# Patient Record
Sex: Male | Born: 1945 | Race: White | Hispanic: No | Marital: Married | Smoking: Never smoker
Health system: Southern US, Community
[De-identification: ages and names within clinical notes are randomized; demographics above are authoritative.]

## PROBLEM LIST (undated history)

## (undated) DIAGNOSIS — R32 Unspecified urinary incontinence: Secondary | ICD-10-CM

## (undated) DIAGNOSIS — I82409 Acute embolism and thrombosis of unspecified deep veins of unspecified lower extremity: Secondary | ICD-10-CM

## (undated) DIAGNOSIS — G20A1 Parkinson's disease without dyskinesia, without mention of fluctuations: Secondary | ICD-10-CM

## (undated) DIAGNOSIS — Z87442 Personal history of urinary calculi: Secondary | ICD-10-CM

## (undated) DIAGNOSIS — G20 Parkinson's disease: Secondary | ICD-10-CM

## (undated) DIAGNOSIS — E039 Hypothyroidism, unspecified: Secondary | ICD-10-CM

## (undated) DIAGNOSIS — F32A Depression, unspecified: Secondary | ICD-10-CM

## (undated) DIAGNOSIS — K219 Gastro-esophageal reflux disease without esophagitis: Secondary | ICD-10-CM

## (undated) DIAGNOSIS — I10 Essential (primary) hypertension: Secondary | ICD-10-CM

## (undated) DIAGNOSIS — R112 Nausea with vomiting, unspecified: Secondary | ICD-10-CM

## (undated) DIAGNOSIS — Z9889 Other specified postprocedural states: Secondary | ICD-10-CM

## (undated) DIAGNOSIS — G4733 Obstructive sleep apnea (adult) (pediatric): Secondary | ICD-10-CM

## (undated) DIAGNOSIS — M48061 Spinal stenosis, lumbar region without neurogenic claudication: Secondary | ICD-10-CM

## (undated) DIAGNOSIS — G4752 REM sleep behavior disorder: Secondary | ICD-10-CM

## (undated) HISTORY — DX: Acute embolism and thrombosis of unspecified deep veins of unspecified lower extremity: I82.409

## (undated) HISTORY — DX: Obstructive sleep apnea (adult) (pediatric): G47.33

## (undated) HISTORY — DX: Hypothyroidism, unspecified: E03.9

## (undated) HISTORY — PX: TONSILLECTOMY: SUR1361

## (undated) HISTORY — PX: COLONOSCOPY: SHX174

## (undated) HISTORY — DX: Spinal stenosis, lumbar region without neurogenic claudication: M48.061

## (undated) HISTORY — PX: NEPHRECTOMY: SHX65

## (undated) HISTORY — DX: REM sleep behavior disorder: G47.52

## (undated) HISTORY — PX: PARTIAL NEPHRECTOMY: SHX414

## (undated) HISTORY — PX: SPINAL FUSION: SHX223

## (undated) HISTORY — DX: Parkinson's disease without dyskinesia, without mention of fluctuations: G20.A1

## (undated) HISTORY — PX: BACK SURGERY: SHX140

## (undated) HISTORY — PX: DEEP BRAIN STIMULATOR PLACEMENT: SHX608

## (undated) HISTORY — DX: Parkinson's disease: G20

## (undated) HISTORY — PX: KNEE SURGERY: SHX244

## (undated) HISTORY — DX: Essential (primary) hypertension: I10

## (undated) HISTORY — DX: Unspecified urinary incontinence: R32

## (undated) HISTORY — DX: Gastro-esophageal reflux disease without esophagitis: K21.9

---

## 2008-04-12 ENCOUNTER — Emergency Department (HOSPITAL_BASED_OUTPATIENT_CLINIC_OR_DEPARTMENT_OTHER): Admission: EM | Admit: 2008-04-12 | Discharge: 2008-04-12 | Payer: Self-pay | Admitting: Emergency Medicine

## 2008-04-12 ENCOUNTER — Ambulatory Visit: Payer: Self-pay | Admitting: Diagnostic Radiology

## 2010-06-16 LAB — DIFFERENTIAL
Basophils Absolute: 0.3 10*3/uL — ABNORMAL HIGH (ref 0.0–0.1)
Basophils Relative: 3 % — ABNORMAL HIGH (ref 0–1)
Eosinophils Absolute: 0 10*3/uL (ref 0.0–0.7)
Eosinophils Relative: 0 % (ref 0–5)
Lymphocytes Relative: 7 % — ABNORMAL LOW (ref 12–46)
Lymphs Abs: 0.7 10*3/uL (ref 0.7–4.0)
Monocytes Absolute: 0.6 10*3/uL (ref 0.1–1.0)
Monocytes Relative: 5 % (ref 3–12)
Neutro Abs: 9 10*3/uL — ABNORMAL HIGH (ref 1.7–7.7)
Neutrophils Relative %: 86 % — ABNORMAL HIGH (ref 43–77)

## 2010-06-16 LAB — COMPREHENSIVE METABOLIC PANEL
ALT: 27 U/L (ref 0–53)
AST: 32 U/L (ref 0–37)
Albumin: 4.4 g/dL (ref 3.5–5.2)
Alkaline Phosphatase: 75 U/L (ref 39–117)
BUN: 24 mg/dL — ABNORMAL HIGH (ref 6–23)
CO2: 28 mEq/L (ref 19–32)
Calcium: 9.5 mg/dL (ref 8.4–10.5)
Chloride: 104 mEq/L (ref 96–112)
Creatinine, Ser: 1.3 mg/dL (ref 0.4–1.5)
GFR calc Af Amer: >60 mL/min (ref >60)
GFR calc non Af Amer: 56 mL/min — ABNORMAL LOW (ref >60)
Glucose, Bld: 115 mg/dL — ABNORMAL HIGH (ref 70–99)
Potassium: 4.5 mEq/L (ref 3.5–5.1)
Sodium: 140 mEq/L (ref 135–145)
Total Bilirubin: 1.2 mg/dL (ref 0.3–1.2)
Total Protein: 7.6 g/dL (ref 6.0–8.3)

## 2010-06-16 LAB — URINALYSIS, ROUTINE W REFLEX MICROSCOPIC
Bilirubin Urine: NEGATIVE
Glucose, UA: NEGATIVE mg/dL
Hgb urine dipstick: NEGATIVE
Ketones, ur: NEGATIVE mg/dL
Nitrite: NEGATIVE
Protein, ur: NEGATIVE mg/dL
Specific Gravity, Urine: 1.016 (ref 1.005–1.030)
Urobilinogen, UA: 0.2 mg/dL (ref 0.0–1.0)
pH: 8 (ref 5.0–8.0)

## 2010-06-16 LAB — CBC
HCT: 47 % (ref 39.0–52.0)
Hemoglobin: 15.1 g/dL (ref 13.0–17.0)
MCHC: 32 g/dL (ref 30.0–36.0)
MCV: 91.6 fL (ref 78.0–100.0)
Platelets: 183 10*3/uL (ref 150–400)
RBC: 5.14 MIL/uL (ref 4.22–5.81)
RDW: 11.6 % (ref 11.5–15.5)
WBC: 10.6 10*3/uL — ABNORMAL HIGH (ref 4.0–10.5)

## 2012-08-02 ENCOUNTER — Ambulatory Visit: Attending: Neurology | Admitting: Occupational Therapy

## 2012-08-02 DIAGNOSIS — G20A1 Parkinson's disease without dyskinesia, without mention of fluctuations: Secondary | ICD-10-CM | POA: Insufficient documentation

## 2012-08-02 DIAGNOSIS — R269 Unspecified abnormalities of gait and mobility: Secondary | ICD-10-CM | POA: Insufficient documentation

## 2012-08-02 DIAGNOSIS — G20 Parkinson's disease: Secondary | ICD-10-CM | POA: Insufficient documentation

## 2012-08-02 DIAGNOSIS — R279 Unspecified lack of coordination: Secondary | ICD-10-CM | POA: Insufficient documentation

## 2012-08-02 DIAGNOSIS — Z5189 Encounter for other specified aftercare: Secondary | ICD-10-CM | POA: Insufficient documentation

## 2012-08-03 ENCOUNTER — Ambulatory Visit: Admitting: Occupational Therapy

## 2012-08-08 ENCOUNTER — Ambulatory Visit: Admitting: Physical Therapy

## 2012-08-08 ENCOUNTER — Ambulatory Visit: Admitting: Occupational Therapy

## 2012-08-09 ENCOUNTER — Ambulatory Visit: Admitting: Physical Therapy

## 2012-08-10 ENCOUNTER — Ambulatory Visit: Admitting: Occupational Therapy

## 2012-08-13 ENCOUNTER — Other Ambulatory Visit: Payer: Self-pay | Admitting: Neurology

## 2012-08-15 ENCOUNTER — Ambulatory Visit: Admitting: Physical Therapy

## 2012-08-17 ENCOUNTER — Ambulatory Visit: Admitting: Occupational Therapy

## 2012-08-17 ENCOUNTER — Ambulatory Visit: Admitting: Physical Therapy

## 2012-08-21 ENCOUNTER — Ambulatory Visit: Admitting: Physical Therapy

## 2012-08-29 ENCOUNTER — Ambulatory Visit: Admitting: Physical Therapy

## 2012-08-29 ENCOUNTER — Ambulatory Visit: Attending: Neurology | Admitting: Occupational Therapy

## 2012-08-29 DIAGNOSIS — R269 Unspecified abnormalities of gait and mobility: Secondary | ICD-10-CM | POA: Insufficient documentation

## 2012-08-29 DIAGNOSIS — G20 Parkinson's disease: Secondary | ICD-10-CM | POA: Insufficient documentation

## 2012-08-29 DIAGNOSIS — Z5189 Encounter for other specified aftercare: Secondary | ICD-10-CM | POA: Insufficient documentation

## 2012-08-29 DIAGNOSIS — G20A1 Parkinson's disease without dyskinesia, without mention of fluctuations: Secondary | ICD-10-CM | POA: Insufficient documentation

## 2012-08-29 DIAGNOSIS — R279 Unspecified lack of coordination: Secondary | ICD-10-CM | POA: Insufficient documentation

## 2012-08-30 ENCOUNTER — Ambulatory Visit: Admitting: Occupational Therapy

## 2012-08-31 ENCOUNTER — Ambulatory Visit: Admitting: Physical Therapy

## 2012-09-05 ENCOUNTER — Ambulatory Visit: Admitting: Occupational Therapy

## 2012-09-05 ENCOUNTER — Ambulatory Visit: Admitting: Physical Therapy

## 2012-09-07 ENCOUNTER — Ambulatory Visit: Admitting: Physical Therapy

## 2012-09-07 ENCOUNTER — Ambulatory Visit: Admitting: Occupational Therapy

## 2013-03-05 ENCOUNTER — Ambulatory Visit: Admitting: Occupational Therapy

## 2013-03-05 ENCOUNTER — Ambulatory Visit: Admitting: Physical Therapy

## 2013-03-29 ENCOUNTER — Ambulatory Visit: Admitting: Physical Therapy

## 2013-03-29 ENCOUNTER — Ambulatory Visit: Admitting: Occupational Therapy

## 2013-06-14 ENCOUNTER — Other Ambulatory Visit: Payer: Self-pay | Admitting: Neurology

## 2013-09-27 ENCOUNTER — Ambulatory Visit: Payer: Medicare Other | Attending: Neurology | Admitting: Physical Therapy

## 2013-09-27 ENCOUNTER — Ambulatory Visit: Payer: Medicare Other | Admitting: Occupational Therapy

## 2013-09-27 ENCOUNTER — Ambulatory Visit: Payer: Medicare Other | Admitting: Speech Pathology

## 2013-10-23 ENCOUNTER — Ambulatory Visit: Payer: Medicare Other | Admitting: Speech Pathology

## 2013-10-23 ENCOUNTER — Ambulatory Visit: Payer: Medicare Other | Attending: Neurology | Admitting: Physical Therapy

## 2013-10-23 ENCOUNTER — Ambulatory Visit: Payer: Medicare Other | Admitting: Occupational Therapy

## 2013-10-23 DIAGNOSIS — R269 Unspecified abnormalities of gait and mobility: Secondary | ICD-10-CM | POA: Diagnosis not present

## 2013-10-23 DIAGNOSIS — R471 Dysarthria and anarthria: Secondary | ICD-10-CM | POA: Diagnosis not present

## 2013-10-23 DIAGNOSIS — G20 Parkinson's disease: Secondary | ICD-10-CM | POA: Insufficient documentation

## 2013-10-23 DIAGNOSIS — R279 Unspecified lack of coordination: Secondary | ICD-10-CM | POA: Diagnosis not present

## 2013-10-23 DIAGNOSIS — G20A1 Parkinson's disease without dyskinesia, without mention of fluctuations: Secondary | ICD-10-CM | POA: Insufficient documentation

## 2013-10-23 DIAGNOSIS — Z5189 Encounter for other specified aftercare: Secondary | ICD-10-CM | POA: Insufficient documentation

## 2013-10-25 ENCOUNTER — Ambulatory Visit: Payer: Medicare Other | Admitting: Occupational Therapy

## 2013-10-25 ENCOUNTER — Ambulatory Visit: Payer: Medicare Other

## 2013-10-25 DIAGNOSIS — Z5189 Encounter for other specified aftercare: Secondary | ICD-10-CM | POA: Diagnosis not present

## 2013-10-26 ENCOUNTER — Ambulatory Visit: Payer: Medicare Other | Admitting: Rehabilitative and Restorative Service Providers"

## 2013-10-26 DIAGNOSIS — Z5189 Encounter for other specified aftercare: Secondary | ICD-10-CM | POA: Diagnosis not present

## 2013-10-29 ENCOUNTER — Ambulatory Visit: Payer: Medicare Other

## 2013-10-29 ENCOUNTER — Ambulatory Visit: Payer: Medicare Other | Admitting: Rehabilitative and Restorative Service Providers"

## 2013-10-29 DIAGNOSIS — Z5189 Encounter for other specified aftercare: Secondary | ICD-10-CM | POA: Diagnosis not present

## 2013-10-31 ENCOUNTER — Ambulatory Visit: Payer: Medicare Other

## 2013-11-07 ENCOUNTER — Ambulatory Visit: Payer: Medicare Other | Admitting: Physical Therapy

## 2013-11-07 ENCOUNTER — Ambulatory Visit: Payer: Medicare Other | Attending: Neurology | Admitting: Occupational Therapy

## 2013-11-07 DIAGNOSIS — G20 Parkinson's disease: Secondary | ICD-10-CM | POA: Diagnosis not present

## 2013-11-07 DIAGNOSIS — R279 Unspecified lack of coordination: Secondary | ICD-10-CM | POA: Diagnosis not present

## 2013-11-07 DIAGNOSIS — G20A1 Parkinson's disease without dyskinesia, without mention of fluctuations: Secondary | ICD-10-CM | POA: Insufficient documentation

## 2013-11-07 DIAGNOSIS — IMO0001 Reserved for inherently not codable concepts without codable children: Secondary | ICD-10-CM | POA: Insufficient documentation

## 2013-11-09 ENCOUNTER — Ambulatory Visit: Payer: Medicare Other | Admitting: Physical Therapy

## 2013-11-13 ENCOUNTER — Ambulatory Visit: Payer: Medicare Other | Admitting: Physical Therapy

## 2013-11-13 ENCOUNTER — Ambulatory Visit: Payer: Medicare Other | Admitting: Occupational Therapy

## 2013-11-13 ENCOUNTER — Encounter: Payer: Medicare Other | Admitting: Speech Pathology

## 2013-11-13 DIAGNOSIS — IMO0001 Reserved for inherently not codable concepts without codable children: Secondary | ICD-10-CM | POA: Diagnosis not present

## 2013-11-14 ENCOUNTER — Ambulatory Visit: Payer: Medicare Other | Admitting: Occupational Therapy

## 2013-11-14 ENCOUNTER — Ambulatory Visit: Payer: Medicare Other | Admitting: Physical Therapy

## 2013-11-14 DIAGNOSIS — IMO0001 Reserved for inherently not codable concepts without codable children: Secondary | ICD-10-CM | POA: Diagnosis not present

## 2013-11-16 ENCOUNTER — Encounter: Payer: Medicare Other | Admitting: Occupational Therapy

## 2013-11-16 ENCOUNTER — Ambulatory Visit: Payer: Medicare Other | Admitting: Physical Therapy

## 2013-11-19 ENCOUNTER — Ambulatory Visit: Payer: Medicare Other | Admitting: Physical Therapy

## 2013-11-19 ENCOUNTER — Encounter: Payer: Medicare Other | Admitting: Occupational Therapy

## 2013-11-20 ENCOUNTER — Ambulatory Visit: Payer: Medicare Other | Admitting: Occupational Therapy

## 2013-11-20 DIAGNOSIS — IMO0001 Reserved for inherently not codable concepts without codable children: Secondary | ICD-10-CM | POA: Diagnosis not present

## 2013-11-21 ENCOUNTER — Ambulatory Visit: Payer: Medicare Other | Admitting: Physical Therapy

## 2013-11-21 ENCOUNTER — Encounter: Payer: Medicare Other | Admitting: Occupational Therapy

## 2013-11-22 ENCOUNTER — Ambulatory Visit: Payer: Medicare Other | Admitting: Occupational Therapy

## 2013-11-23 ENCOUNTER — Ambulatory Visit: Payer: Medicare Other | Admitting: Occupational Therapy

## 2013-11-23 DIAGNOSIS — IMO0001 Reserved for inherently not codable concepts without codable children: Secondary | ICD-10-CM | POA: Diagnosis not present

## 2013-11-26 ENCOUNTER — Encounter: Payer: Medicare Other | Admitting: Occupational Therapy

## 2013-11-26 ENCOUNTER — Ambulatory Visit: Payer: Medicare Other | Admitting: Physical Therapy

## 2014-01-16 ENCOUNTER — Encounter: Payer: Self-pay | Admitting: Neurology

## 2014-01-22 ENCOUNTER — Encounter: Payer: Self-pay | Admitting: Neurology

## 2014-03-14 ENCOUNTER — Ambulatory Visit: Payer: Medicare Other | Admitting: Occupational Therapy

## 2014-03-14 ENCOUNTER — Ambulatory Visit: Payer: Medicare Other | Admitting: Physical Therapy

## 2014-03-14 ENCOUNTER — Ambulatory Visit: Payer: Medicare Other | Attending: Neurology

## 2014-03-14 DIAGNOSIS — R471 Dysarthria and anarthria: Secondary | ICD-10-CM

## 2014-03-14 DIAGNOSIS — G20 Parkinson's disease: Secondary | ICD-10-CM | POA: Insufficient documentation

## 2014-03-14 DIAGNOSIS — R279 Unspecified lack of coordination: Secondary | ICD-10-CM

## 2014-03-14 DIAGNOSIS — R269 Unspecified abnormalities of gait and mobility: Secondary | ICD-10-CM

## 2014-03-14 NOTE — Therapy (Signed)
[facility] [address] [place], [place], [address] Phone: [phone]   Fax:  [phone]  Patient Details  Name: Patrick Ross MRN: [id number] Date of Birth: 04-Oct-1945 Referring Provider:  Eldridge Abrahams, MD  Encounter Date: 03/14/2014 Physical Therapy Parkinson's Disease Screen   Timed Up and Go test:11.12 sec  10 meter walk test:9.91 seconds= 3.31 ft/sec  5 time sit to stand test:13.28 sec   Patient does not require Physical Therapy services at this time.  Recommend Physical Therapy screen in 4-6 months.  Pt describes occasional dystonia in right foot and in right shoulder, which he feels are new.  Recommended pt follow up with physician.    Eulas Schweitzer W. 03/15/2014, 9:17 AM  Mady Haagensen, PT 03/15/2014 9:26 AM Phone: [phone] Fax: [facility] [address] [place], [place], [address] Phone: [phone]   Fax:  [phone]

## 2014-03-14 NOTE — Therapy (Signed)
[facility] [address] [place], [place], [address] Phone: [phone]   Fax:  [phone]  Occupational Therapy Treatment  Patient Details  Name: Patrick Ross MRN: [id number] Date of Birth: [birth year] Referring Provider:  Eldridge Abrahams, MD  Encounter Date: 03/14/2014    No past medical history on file.  No past surgical history on file.  There were no vitals taken for this visit.  Visit Diagnosis:  Lack of coordination     Occupational Therapy Parkinson's Disease Screen  Physical Performance Test item #4 (donning/doffing jacket):  15.69sec  9-hole peg test:    RUE  25.50sec        LUE  31.53sec  Box & Blocks Test:   RUE  55blocks        LUE  60blocks  Change in ability to perform ADLs/IADLs:  no  Other Comments:  Pt going to see neuro-ophthalmologist next month for decreased depth perception/peripheral vision changes.   Pt going to PD PWR! circuit class.  Pt does not require occupation therapy services at this time.  Recommended occupational therapy screen in   6 months.                          Problem List There are no active problems to display for this patient.   [facility] 03/14/2014, 11:20 AM  [facility] [address] [place], [place], [address] Phone: [phone]   Fax:  [phone]

## 2014-03-14 NOTE — Therapy (Signed)
[facility] [address] [place], [place], [address] Phone: [phone]   Fax:  [phone]  Patient Details  Name: Patrick Ross MRN: [id number] Date of Birth: 1946-01-15 Referring Provider:  Eldridge Abrahams, MD  Encounter Date: 03/14/2014 Speech Therapy Parkinson's Disease Screen  Date: 03-14-14    Decibel Level today: 70dB  (WNL=70-72 dB) with sound level meter 30cm away from pt's mouth Pt's conversational volume is WNL, however pt exhibits min frequent rushes of speech. He reports people have not asked him to repeat more frequently in last 4-6 months.  Pt has not experienced difficulty in swallowing warranting objective evaluation.  Pt does does not require speech therapy services at this time. Recommend ST screen in another 4-6 months   [facility], SLP 03/14/2014, 10:44 AM  [facility] [address] [place], [place], [address] Phone: [phone]   Fax:  [phone]

## 2014-08-27 ENCOUNTER — Ambulatory Visit: Payer: Medicare Other | Admitting: Physical Therapy

## 2014-08-27 ENCOUNTER — Ambulatory Visit: Payer: Medicare Other | Admitting: Speech Pathology

## 2014-08-27 ENCOUNTER — Ambulatory Visit: Payer: Medicare Other | Attending: Family Medicine | Admitting: Occupational Therapy

## 2014-08-27 DIAGNOSIS — G20 Parkinson's disease: Secondary | ICD-10-CM

## 2014-08-27 DIAGNOSIS — R471 Dysarthria and anarthria: Secondary | ICD-10-CM

## 2014-08-27 DIAGNOSIS — R279 Unspecified lack of coordination: Secondary | ICD-10-CM

## 2014-08-27 NOTE — Therapy (Signed)
[facility] [address] [place], [place], [address] Phone: [phone]   Fax:  [phone]  Patient Details  Name: Patrick Ross MRN: [id number] Date of Birth: [birth year] Referring Provider:  Jefm Petty, MD  Encounter Date: 08/27/2014     .Occupational Therapy Parkinson's Disease Screen   Physical Performance Test item #4 (donning/doffing jacket):  13.72  9-hole peg test:    RUE 21.00  secs       LUE  30.69 secs  Box & Blocks Test:   RUE NT        LUE  NT  Standing Functional Reach Test:   RUE  NT     LUE  NT  Handwriting: 100% legibility with sentence  Change in ability to perform ADLs/IADLs: denies changes in performance  Other Comments:  Pt has had more falls  No OT recommend at  at this time.  Recommended occupational therapy screen in   6 mons  Meyer Arora 08/27/2014, 1:14 PM  [facility] [address] [place], [place], [address] Phone: [phone]   Fax:  [phone]

## 2014-08-27 NOTE — Therapy (Signed)
[facility] [address] [place], [place], [address] Phone: [phone]   Fax:  [phone]  Patient Details  Name: Patrick Ross MRN: [id number] Date of Birth: 09-18-1945 Referring Provider:  No ref. provider found   Speech Therapy Parkinson's Disease Screen  Date: 08/27/14    Decibel Level today: 70dB  (WNL=70-72 dB) with sound level meter 30cm away from pt's mouth Pt's conversational volume has remained stable since last treatment course. Pt's c/o of speech being more slurred, and short rushes of speech observed today  Pt has not experienced difficulty in swallowing warranting objective evaluation - does c/o more difficulty with hard, dry solids on occasion.Encouraged pt to moisten or avoid troublesome foods.  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

## 2014-08-27 NOTE — Therapy (Signed)
[facility] [address] [place], [place], [address] Phone: [phone]   Fax:  [phone]  Patient Details  Name: Patrick Ross MRN: [id number] Date of Birth: [birth year] Referring Provider:  Jefm Petty, MD  Encounter Date: 08/27/2014   Physical Therapy Parkinson's Disease Screen   Timed Up and Go test:  8.03 seconds no device  10 meter walk test:  4.48 ft/second no device  5 time sit to stand test:  13.12 seconds    Patient does not require Physical Therapy services at this time.  Recommend possible Physical Therapy screen in 6 months.  PT discussed briefly with patient his functional status. He has had at least 3 falls in the past several months.  PT may be beneficial to work on balance; however, pt is scheduled for consult for upcoming back surgery.  This PT will wait to hear from patient regarding upcoming back surgery and make decision regarding any upcoming therapy.     Narda Bonds 08/27/2014, 1:55 PM  Frazier Butt., PT  [facility] [address] [place], [place], [address] Phone: [phone]   Fax:  [place], [place] 08/27/2014 1:55 PM Phone: [phone] Fax: [phone]

## 2014-08-29 ENCOUNTER — Ambulatory Visit: Payer: Medicare Other

## 2017-09-06 NOTE — Progress Notes (Signed)
Patrick Ross was seen today in the movement disorders clinic for neurologic consultation at the request of Jefm Petty, MD. This patient is accompanied in the office by his spouse who supplements the history. The consultation is for the evaluation of PD.  Multiple medical records are reviewed regarding the patient's history.  He has been under the care of Dr. Linus Mako.  The patient last saw him on May 02, 2017.  The patient was diagnosed with Parkinson's disease in approximately 2000.  First sx was decreased L arm swing.    The patient underwent right STN DBS on December 10, 2011 and a left STN DBS on December 27, 2012.  Postoperatively, his left STN DBS lead was found to be displaced and had revision of the left lead on February 02, 2013.  Pt thinks that the L side (right body) has not been as effective as still has curling of the foot, even with the botox.    Patient's current movement disorder medications: Stalevo 200 mg 4 times per day (7/11/3/7) On carbidopa/levodopa 25/100 1 tablet as needed 3 times per day (7/11/3pm) And carbidopa/levodopa 50/200 at bedtime Amantadine 100 mg 3 times per day Clonazepam 0.5 mg at bedtime (for sleep but cpap has helped a lot.  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  Wife states that surgery did help with dyskinesia and tremor.    Most recent notes from Dr. Deboraha Sprang indicate that patients biggest issues now are things that are unresponsive to medications.  He has fatigue, apathy, gait imbalance and profound speech change.  He doesn't think that PD has contributed to speech change.   Specific Symptoms:  Tremor: No., not now.  Early on had a little R hand tremor Family hx of similar:  No. Voice: trouble with clarity, volume, word finding.  Last saw ST several years ago Sleep: sleeping better with CPAP  Vivid Dreams:  Yes.    Acting out dreams:  Yes.   Postural symptoms:   Yes.  , last PT years ago.  Does [employer] cycle class  Falls?  Yes.  , but felt due to having wrong walker  Bradykinesia symptoms: difficulty getting out of a chair (has lift chair at home); some shuffling of the feet Loss of smell:  Yes.   Loss of taste:  No. Urinary Incontinence:  No. Difficulty Swallowing:  Yes.  , just minor with dry food Handwriting, micrographia: Yes.   Trouble with ADL's:  No.  Trouble buttoning clothing: No. Depression:  No. but some motivation issues Memory changes:  Yes.  , some short term issues.  Very little driving now.  Drives to [employer] and [employer].  May drift to the left.  Uses alarm as reminder to take meds Hallucinations:  No.  visual distortions: No. N/V:  No. Lightheaded:  Yes.   , occasionally  Syncope: No. Diplopia:  No., some blurry Dyskinesia:  Yes.     PREVIOUS MEDICATIONS: none to date  ALLERGIES:   Allergies  Allergen Reactions  . Other Other (See Comments)    Surgical spray prior to adhesive causes blisters  . Sulfamethoxazole Other (See Comments)    Unsure--childhood allergy    CURRENT MEDICATIONS:  Outpatient Encounter Medications as of 09/08/2017  Medication Sig  . acetaminophen (TYLENOL) 325 MG tablet Take by mouth as needed.  Marland Kitchen amantadine (SYMMETREL) 100 MG capsule Take 1 capsule by mouth 3 (three) times daily. 7am/1pm/7pm  .  Ascorbic Acid (VITAMIN C) 100 MG tablet Take 100 mg by mouth daily.  . B Complex-C (B-COMPLEX WITH VITAMIN C) tablet Take by mouth.  . carbidopa-levodopa (SINEMET CR) 50-200 MG tablet Take 1 tablet by mouth at bedtime.  . carbidopa-levodopa (SINEMET IR) 25-100 MG tablet Take 1 tablet by mouth 4 (four) times daily. 7am/11am/3pm/7pm  . carbidopa-levodopa-entacapone (STALEVO) 50-200-200 MG tablet Take 1 tablet by mouth 4 (four) times daily. 7am/11am/3pm/7pm  . Cholecalciferol (VITAMIN D3) 2000 units capsule Take by mouth.  . clonazePAM (KLONOPIN) 0.5 MG tablet Take 0.5 mg by mouth at bedtime.   Marland Kitchen co-enzyme  Q-10 30 MG capsule Take 30 mg by mouth daily.  Marland Kitchen gabapentin (NEURONTIN) 300 MG capsule Take 1 capsule by mouth 3 (three) times daily.  Marland Kitchen levothyroxine (SYNTHROID, LEVOTHROID) 200 MCG tablet TAKE 1 TABLET DAILY EXCEPT SAT & SUN TAKE 1/2 TABLET  . lisinopril (PRINIVIL,ZESTRIL) 20 MG tablet Take 1 tablet by mouth daily.  . Magnesium Oxide 400 MG CAPS Take by mouth.  . Multiple Vitamin (MULTIVITAMIN) capsule Take by mouth.  . Omega-3 Fatty Acids (FISH OIL) 1000 MG CAPS Take by mouth.  Marland Kitchen omeprazole (PRILOSEC) 20 MG capsule Take 20 mg by mouth daily.  . Testosterone 10 MG/ACT (2%) GEL Apply 6 pumps to skin daily, 3 pumps to each side for total of 6.  . TURMERIC PO Take by mouth daily.  . Vitamin Mixture (VITAMIN E COMPLETE PO) Take by mouth daily.  . [DISCONTINUED] bisacodyl (DULCOLAX) 5 MG EC tablet Take by mouth.  . [DISCONTINUED] DOCOSAHEXAENOIC ACID PO Take 1 g by mouth.  . [DISCONTINUED] ibuprofen (ADVIL,MOTRIN) 200 MG tablet Take by mouth.   No facility-administered encounter medications on file as of 09/08/2017.     PAST MEDICAL HISTORY:   Past Medical History:  Diagnosis Date  . GERD (gastroesophageal reflux disease)   . Hypertension   . Hypothyroidism   . Lumbar stenosis   . OSA (obstructive sleep apnea)   . Parkinson's disease ([place])   . REM behavioral disorder   . Urinary incontinence     PAST SURGICAL HISTORY:   Past Surgical History:  Procedure Laterality Date  . BACK SURGERY    . DEEP BRAIN STIMULATOR PLACEMENT     left 12/27/2012, right 12/10/2011, left revision 02/02/2013, replacement bilateral 11/21/2015  . KNEE SURGERY    . NEPHRECTOMY    . SPINAL FUSION    . TONSILLECTOMY      SOCIAL HISTORY:   Social History   Socioeconomic History  . Marital status: Married    Spouse name: Not on file  . Number of children: Not on file  . Years of education: Not on file  . Highest education level: Not on file  Occupational History  . Not on file  Social Needs  .  Financial resource strain: Not on file  . Food insecurity:    Worry: Not on file    Inability: Not on file  . Transportation needs:    Medical: Not on file    Non-medical: Not on file  Tobacco Use  . Smoking status: Never Smoker  . Smokeless tobacco: Never Used  Substance and Sexual Activity  . Alcohol use: Not Currently  . Drug use: Never  . Sexual activity: Not on file  Lifestyle  . Physical activity:    Days per week: Not on file    Minutes per session: Not on file  . Stress: Not on file  Relationships  . Social connections:  Talks on phone: Not on file    Gets together: Not on file    Attends religious service: Not on file    Active member of club or organization: Not on file    Attends meetings of clubs or organizations: Not on file    Relationship status: Not on file  . Intimate partner violence:    Fear of current or ex partner: Not on file    Emotionally abused: Not on file    Physically abused: Not on file    Forced sexual activity: Not on file  Other Topics Concern  . Not on file  Social History Narrative  . Not on file    FAMILY HISTORY:   Family Status  Relation Name Status  . Mother  Alive  . Father  Deceased  . Brother  Alive  . Child x3 Alive    ROS:  Review of Systems  Constitutional: Positive for malaise/fatigue.  HENT: Negative.   Eyes: Positive for blurred vision.  Respiratory: Negative.   Cardiovascular: Negative.   Gastrointestinal: Negative.   Genitourinary: Negative.   Skin: Negative.   Neurological: Negative.   Endo/Heme/Allergies: Negative.     PHYSICAL EXAMINATION:    VITALS:   Vitals:   09/08/17 0849  BP: 138/82  Pulse: 86  SpO2: 98%  Weight: 226 lb (102.5 kg)  Height: 6\' 2"  (1.88 m)    GEN:  The patient appears stated age and is in NAD. HEENT:  Normocephalic, atraumatic.  The mucous membranes are moist. The superficial temporal arteries are without ropiness or tenderness. CV:  RRR Lungs:  CTAB Neck/HEME:  There  are no carotid bruits bilaterally.  Neurological examination:  Orientation: The patient is alert and oriented x3. Fund of knowledge is appropriate.  Recent and remote memory are intact.  Attention and concentration are normal.    Able to name objects and repeat phrases. Cranial nerves: There is good facial symmetry. Pupils are equal round and reactive to light bilaterally. Fundoscopic exam reveals clear margins bilaterally. Extraocular muscles are intact. The visual fields are full to confrontational testing. The speech is fluent and mildly dysarthric. Soft palate rises symmetrically and there is no tongue deviation. Hearing is intact to conversational tone. Sensation: Sensation is intact to light and pinprick throughout (facial, trunk, extremities). Vibration is intact at the bilateral big toe but slightly decreased. There is no extinction with double simultaneous stimulation. There is no sensory dermatomal level identified. Motor: Strength is 5/5 in the bilateral UE and RLE.  There is 4/5 strength in the proximal LLE and 5/5 in the distal LLE except L ankle dorsiflexion and strength there is 4/5.   Deep tendon reflexes: Deep tendon reflexes are 2/4 at the bilateral biceps, triceps, brachioradialis, 1/4 at the bilateral patella and trace at the bilateral achilles. Plantar responses are downgoing bilaterally.  Movement examination: Tone: There is normal tone in the bilateral upper extremities.  The tone in the lower extremities is normal.  Abnormal movements: there is mild dyskinesia in the RUE Coordination:  There is mild decremation with RAM's, only with finger taps on the right.  All other RAMs are normal Gait and Station: The patient has difficulty arising out of a deep-seated chair without the use of the hands. He makes 3 unsuccessful attempts.  The patient's stride length is very ataxic.  Minimal shuffling but does drag the right leg.  Does better with the walker but some trouble with the turn.         ASSESSMENT/PLAN:  1.  Idiopathic Parkinson's disease, diagnosed in the year 2000  -The patient underwent right STN DBS on December 10, 2011 and a left STN DBS on December 27, 2012.  Postoperatively, his left STN DBS lead was found to be displaced and had revision of the left lead on February 02, 2013.  It appears battery was last changed in September, 2017.  -I am going to schedule a postoperative MRI of the brain to look at his leads.  He has never really been able to drop medication load.  -Patient will be referred to occupational and speech therapy at the neuro rehab center.  He wants to continue physical therapy with orthopedics (does that for lumbosacral radiculopathy).  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  Explained to him that I really do not do much Botox in the feet, but will refer him to Dr. Letta Pate for that.  -I did not change the patient's medications today, but wonder if some of the memory change that he is describing is not from medication.  We can readdress this in the future.  -Talked about the value of exercising.  He really is doing much of this.  He is coming to the Parkinson's symposium tomorrow.  -Safety was discussed.  Community programs were discussed.  Social worker in the office was offered as a Theatre manager.  2.  I will follow-up with the patient after the above has been completed.  Greater than 50% of the 60-minute visit was spent in counseling.This did not include the 40 min of record review which was detailed above, which was non face to face time.  This did also not include DBS time.   Cc:  Jefm Petty, MD

## 2017-09-07 ENCOUNTER — Encounter: Payer: Self-pay | Admitting: Neurology

## 2017-09-08 ENCOUNTER — Telehealth: Payer: Self-pay | Admitting: Neurology

## 2017-09-08 ENCOUNTER — Ambulatory Visit (INDEPENDENT_AMBULATORY_CARE_PROVIDER_SITE_OTHER): Payer: Medicare Other | Admitting: Neurology

## 2017-09-08 ENCOUNTER — Encounter: Payer: Self-pay | Admitting: Neurology

## 2017-09-08 VITALS — BP 138/82 | HR 86 | Ht 74.0 in | Wt 226.0 lb

## 2017-09-08 DIAGNOSIS — G249 Dystonia, unspecified: Secondary | ICD-10-CM

## 2017-09-08 DIAGNOSIS — Z9689 Presence of other specified functional implants: Secondary | ICD-10-CM

## 2017-09-08 DIAGNOSIS — G20 Parkinson's disease: Secondary | ICD-10-CM | POA: Diagnosis not present

## 2017-09-08 NOTE — Telephone Encounter (Signed)
PT added to order.

## 2017-09-08 NOTE — Patient Instructions (Addendum)
1. We have sent a referral to [facility] for your MRI and we will work with them directly to schedule your appt. This will need to be coordinated with the [employer] Rep, so it may take some time. If you need to contact them directly please call  [phone].  2. You have been referred to [facility] for speech and occupational therapy. They will call you directly to schedule an appointment.  Please call [phone] if you do not hear from them.   3. We have sent a referral to Dr. Read Drivers to evaluate you for Botox for foot dystonia. Their office should call you to set up an appointment. If you do not hear from them, they can be reached at [phone].

## 2017-09-08 NOTE — Telephone Encounter (Signed)
He told me this morning he is going somewhere else for PT and wanted to continue there.  Whatever he wants is fine

## 2017-09-08 NOTE — Telephone Encounter (Signed)
Okay to add PT?

## 2017-09-08 NOTE — Telephone Encounter (Signed)
LMOM for Patrick Ross in MR department to coordinate MR with Dusty from [employer]. She is to call me with any questions/problems.

## 2017-09-08 NOTE — Telephone Encounter (Signed)
Patient states he would like to be referred for PT as well.

## 2017-09-08 NOTE — Procedures (Signed)
DBS Programming was performed.    Total time spent programming was 10 minutes.  Device was confirmed to be on.  Soft start was confirmed to be on.  Impedences were checked and were within normal limits.  Battery was checked and was determined to be functioning normally and not near the end of life.  Final settings were as follows:  Left brain electrode:     2-1+           ; Amplitude  4.7   V   ; Pulse width 90 microseconds;   Frequency   135   Hz.  Right brain electrode:     3-C+          ; Amplitude   3.2  V ;  Pulse width 90  microseconds;  Frequency   135    Hz.

## 2017-09-12 ENCOUNTER — Telehealth: Payer: Self-pay | Admitting: Neurology

## 2017-09-12 ENCOUNTER — Encounter: Payer: Self-pay | Admitting: Physical Medicine & Rehabilitation

## 2017-09-12 NOTE — Telephone Encounter (Signed)
Called patient and made them aware he is scheduled for next Monday (July 22nd) at 2pm. Asked him to bring his implant card with him for his DBS per [facility] in MR at [facility].

## 2017-09-19 ENCOUNTER — Ambulatory Visit (HOSPITAL_COMMUNITY)
Admission: RE | Admit: 2017-09-19 | Discharge: 2017-09-19 | Disposition: A | Discharge status: Home / Self Care | Payer: Medicare Other | Source: Ambulatory Visit | Attending: Neurology | Admitting: Neurology

## 2017-09-19 DIAGNOSIS — Z9689 Presence of other specified functional implants: Secondary | ICD-10-CM | POA: Diagnosis present

## 2017-09-19 DIAGNOSIS — G20 Parkinson's disease: Secondary | ICD-10-CM | POA: Diagnosis present

## 2017-09-20 ENCOUNTER — Encounter: Payer: Self-pay | Admitting: Physical Medicine & Rehabilitation

## 2017-09-20 ENCOUNTER — Other Ambulatory Visit: Payer: Self-pay

## 2017-09-20 ENCOUNTER — Other Ambulatory Visit (HOSPITAL_COMMUNITY): Payer: Self-pay

## 2017-09-20 ENCOUNTER — Telehealth: Payer: Self-pay | Admitting: Neurology

## 2017-09-20 ENCOUNTER — Ambulatory Visit (HOSPITAL_BASED_OUTPATIENT_CLINIC_OR_DEPARTMENT_OTHER): Payer: Medicare Other | Admitting: Physical Medicine & Rehabilitation

## 2017-09-20 ENCOUNTER — Encounter: Payer: Medicare Other | Attending: Physical Medicine & Rehabilitation

## 2017-09-20 VITALS — BP 135/86 | HR 82 | Ht 75.0 in | Wt 224.0 lb

## 2017-09-20 DIAGNOSIS — Z8669 Personal history of other diseases of the nervous system and sense organs: Secondary | ICD-10-CM | POA: Diagnosis not present

## 2017-09-20 DIAGNOSIS — G4733 Obstructive sleep apnea (adult) (pediatric): Secondary | ICD-10-CM | POA: Insufficient documentation

## 2017-09-20 DIAGNOSIS — I10 Essential (primary) hypertension: Secondary | ICD-10-CM | POA: Insufficient documentation

## 2017-09-20 DIAGNOSIS — Z79890 Hormone replacement therapy (postmenopausal): Secondary | ICD-10-CM | POA: Diagnosis not present

## 2017-09-20 DIAGNOSIS — E039 Hypothyroidism, unspecified: Secondary | ICD-10-CM | POA: Diagnosis not present

## 2017-09-20 DIAGNOSIS — G248 Other dystonia: Secondary | ICD-10-CM | POA: Diagnosis present

## 2017-09-20 DIAGNOSIS — Z79899 Other long term (current) drug therapy: Secondary | ICD-10-CM | POA: Insufficient documentation

## 2017-09-20 DIAGNOSIS — G20 Parkinson's disease: Secondary | ICD-10-CM | POA: Insufficient documentation

## 2017-09-20 DIAGNOSIS — K219 Gastro-esophageal reflux disease without esophagitis: Secondary | ICD-10-CM | POA: Diagnosis not present

## 2017-09-20 DIAGNOSIS — M48061 Spinal stenosis, lumbar region without neurogenic claudication: Secondary | ICD-10-CM | POA: Insufficient documentation

## 2017-09-20 NOTE — Patient Instructions (Signed)
Botox next visit  You may call to schedule sacroiliac injection if it needs to be repeated

## 2017-09-20 NOTE — Telephone Encounter (Signed)
Patient made aware of results.  

## 2017-09-20 NOTE — Telephone Encounter (Signed)
-----   Message from [place], DO sent at 09/20/2017  7:59 AM EDT ----- Left sided DBS looks a bit too inferior.  Pinki Rottman - let pt know that DBS leads are in place but L side may not be optimally placed.  We will discuss further at follow up.

## 2017-09-20 NOTE — Telephone Encounter (Signed)
Left message on machine for patient to call back.

## 2017-09-20 NOTE — Progress Notes (Signed)
Subjective:    Patient ID: Patrick Ross, male    DOB: 19-Dec-1945, 72 y.o.   MRN: [id number]  HPI Reason for referral evaluate focal dystonia right foot  72 year old male with Parkinson's disease kindly referred by his neurologist for the evaluation of his right foot focal dystonia.  He has been seen by neurology at [facility] and has received botulinum toxin injection to the flexor digitorum brevis flexor hallucis brevis as well as flexor digitorum longus muscles. Last procedure was in June.  Patient has noted improvement with these injections.  Patient requires walker for ambulation to maintain balance.  Past surgical history significant for bilateral deep brain stimulators as well as lumbar discectomy and multilevel lumbar fusions last performed September 2018. Pain Inventory Average Pain 4 Pain Right Now 3 My pain is intermittent  In the last 24 hours, has pain interfered with the following? General activity 0 Relation with others 0 Enjoyment of life 0 What TIME of day is your pain at its worst? varies Sleep (in general) Fair  Pain is worse with: bending, sitting and standing Pain improves with: rest, therapy/exercise and injections Relief from Meds: no meds  Mobility walk with assistance use a walker how many minutes can you walk? 10 ability to climb steps?  yes do you drive?  no  Function retired  Neuro/Psych weakness numbness dizziness loss of taste or smell  Prior Studies Any changes since last visit?  no  Physicians involved in your care Any changes since last visit?  no   Family History  Problem Relation Age of Onset  . AAA (abdominal aortic aneurysm) Mother        24 yo  . Heart disease Father   . Diabetes Father   . Heart disease Brother   . Diabetes Brother    Social History   Socioeconomic History  . Marital status: Married    Spouse name: Not on file  . Number of children: Not on file  . Years of education:  Not on file  . Highest education level: Not on file  Occupational History  . Not on file  Social Needs  . Financial resource strain: Not on file  . Food insecurity:    Worry: Not on file    Inability: Not on file  . Transportation needs:    Medical: Not on file    Non-medical: Not on file  Tobacco Use  . Smoking status: Never Smoker  . Smokeless tobacco: Never Used  Substance and Sexual Activity  . Alcohol use: Not Currently  . Drug use: Never  . Sexual activity: Not on file  Lifestyle  . Physical activity:    Days per week: Not on file    Minutes per session: Not on file  . Stress: Not on file  Relationships  . Social connections:    Talks on phone: Not on file    Gets together: Not on file    Attends religious service: Not on file    Active member of club or organization: Not on file    Attends meetings of clubs or organizations: Not on file    Relationship status: Not on file  Other Topics Concern  . Not on file  Social History Narrative  . Not on file   Past Surgical History:  Procedure Laterality Date  . BACK SURGERY    . DEEP BRAIN STIMULATOR PLACEMENT     left 12/27/2012, right 12/10/2011, left revision 02/02/2013, replacement bilateral 11/21/2015  .  KNEE SURGERY    . NEPHRECTOMY    . SPINAL FUSION    . TONSILLECTOMY     Past Medical History:  Diagnosis Date  . GERD (gastroesophageal reflux disease)   . Hypertension   . Hypothyroidism   . Lumbar stenosis   . OSA (obstructive sleep apnea)   . Parkinson's disease ([place])   . REM behavioral disorder   . Urinary incontinence    BP 135/86   Pulse 82   Ht 6\' 3"  (1.905 m)   Wt 224 lb (101.6 kg)   SpO2 96%   BMI 28.00 kg/m   Opioid Risk Score:   Fall Risk Score:  `1  Depression screen PHQ 2/9  Depression screen PHQ 2/9 09/20/2017  Decreased Interest 0  Down, Depressed, Hopeless 0  PHQ - 2 Score 0    Review of Systems  Constitutional: Negative.   HENT: Negative.   Eyes: Negative.     Respiratory: Positive for apnea.   Cardiovascular: Negative.   Gastrointestinal: Positive for constipation.  Endocrine: Negative.   Genitourinary: Negative.   Musculoskeletal: Negative.   Skin: Positive for rash.       Big scratch  Allergic/Immunologic: Negative.   Neurological: Negative.   Hematological: Negative.   Psychiatric/Behavioral: Negative.   All other systems reviewed and are negative.      Objective:   Physical Exam  Constitutional: He is oriented to person, place, and time. He appears well-developed and well-nourished. No distress.  HENT:  Head: Normocephalic and atraumatic.  Eyes: Pupils are equal, round, and reactive to light. EOM are normal.  Cardiovascular: Normal rate, regular rhythm, normal heart sounds and intact distal pulses.  No murmur heard. Pulmonary/Chest: Effort normal and breath sounds normal. No respiratory distress.  Abdominal: Soft. Bowel sounds are normal. He exhibits no distension. There is no tenderness.  Neurological: He is alert and oriented to person, place, and time.  Speech hyperkinetic hypophonic dysarthria tone   No evidence of cogwheel rigidity in bilateral elbows and wrists No evidence of rigidity at the ankles and knees. There is no evidence of clonus at the ankles Is motor strength is 5/5 bilateral deltoid bicep tricep grip hip flexor knee extensor ankle dorsiflexor.   With standing patient has constant flexion of the IP joints toes 1 through 5.  There is no evidence of ankle inversion or plantar flexor spasticity. Left foot has intermittent flexion as he tries to maintain his balance.   Skin: Skin is warm and dry. He is not diaphoretic.  Psychiatric: He has a normal mood and affect.  Nursing note and vitals reviewed.         Assessment & Plan:  1.  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  Would recommend repeat Botox injection in September.  The following muscle groups selection and dosing will be  initiated: Right flexor houses longus 50 units Right flexor digitorum longus 50 units Right flexor hallucis brevis 50 units Right flexor digitorum brevis 50 units  Discussed with patient and wife they agree with plan.

## 2017-09-22 ENCOUNTER — Telehealth: Payer: Self-pay | Admitting: Neurology

## 2017-09-22 NOTE — Telephone Encounter (Signed)
Patient made aware that referral has been sent. They will contact [facility] directly to speed up scheduling [phone].

## 2017-09-22 NOTE — Telephone Encounter (Signed)
Patient called and is needing to set up Speech, PT and OT He has not heard from anyone to set it up.  Please call. thanks

## 2017-09-27 ENCOUNTER — Encounter: Payer: Self-pay | Admitting: Neurology

## 2017-09-27 NOTE — Telephone Encounter (Signed)
Patrick Ross,   I received this note from the patient. We did add PT to the rehab order in Epic. Did you guys need something else?

## 2017-10-24 ENCOUNTER — Ambulatory Visit: Payer: Medicare Other | Attending: Neurology

## 2017-10-24 DIAGNOSIS — R41840 Attention and concentration deficit: Secondary | ICD-10-CM | POA: Insufficient documentation

## 2017-10-24 DIAGNOSIS — R29818 Other symptoms and signs involving the nervous system: Secondary | ICD-10-CM | POA: Insufficient documentation

## 2017-10-24 DIAGNOSIS — R2681 Unsteadiness on feet: Secondary | ICD-10-CM | POA: Insufficient documentation

## 2017-10-24 DIAGNOSIS — R41841 Cognitive communication deficit: Secondary | ICD-10-CM

## 2017-10-24 DIAGNOSIS — R2689 Other abnormalities of gait and mobility: Secondary | ICD-10-CM | POA: Diagnosis present

## 2017-10-24 DIAGNOSIS — M6281 Muscle weakness (generalized): Secondary | ICD-10-CM | POA: Insufficient documentation

## 2017-10-24 DIAGNOSIS — R29898 Other symptoms and signs involving the musculoskeletal system: Secondary | ICD-10-CM | POA: Insufficient documentation

## 2017-10-24 DIAGNOSIS — R41844 Frontal lobe and executive function deficit: Secondary | ICD-10-CM | POA: Diagnosis present

## 2017-10-24 DIAGNOSIS — R278 Other lack of coordination: Secondary | ICD-10-CM | POA: Insufficient documentation

## 2017-10-24 DIAGNOSIS — R471 Dysarthria and anarthria: Secondary | ICD-10-CM | POA: Diagnosis present

## 2017-10-24 DIAGNOSIS — R293 Abnormal posture: Secondary | ICD-10-CM | POA: Diagnosis present

## 2017-10-25 ENCOUNTER — Ambulatory Visit: Payer: Medicare Other | Admitting: Physical Therapy

## 2017-10-25 ENCOUNTER — Ambulatory Visit: Payer: Medicare Other | Admitting: Occupational Therapy

## 2017-10-25 ENCOUNTER — Encounter: Payer: Self-pay | Admitting: Occupational Therapy

## 2017-10-25 DIAGNOSIS — R2681 Unsteadiness on feet: Secondary | ICD-10-CM

## 2017-10-25 DIAGNOSIS — R29898 Other symptoms and signs involving the musculoskeletal system: Secondary | ICD-10-CM | POA: Diagnosis present

## 2017-10-25 DIAGNOSIS — R2689 Other abnormalities of gait and mobility: Secondary | ICD-10-CM

## 2017-10-25 DIAGNOSIS — R41840 Attention and concentration deficit: Secondary | ICD-10-CM | POA: Diagnosis present

## 2017-10-25 DIAGNOSIS — R278 Other lack of coordination: Secondary | ICD-10-CM

## 2017-10-25 DIAGNOSIS — R29818 Other symptoms and signs involving the nervous system: Secondary | ICD-10-CM | POA: Diagnosis present

## 2017-10-25 DIAGNOSIS — R471 Dysarthria and anarthria: Secondary | ICD-10-CM | POA: Diagnosis not present

## 2017-10-25 DIAGNOSIS — R41844 Frontal lobe and executive function deficit: Secondary | ICD-10-CM

## 2017-10-25 DIAGNOSIS — M6281 Muscle weakness (generalized): Secondary | ICD-10-CM

## 2017-10-25 DIAGNOSIS — R293 Abnormal posture: Secondary | ICD-10-CM | POA: Diagnosis present

## 2017-10-25 DIAGNOSIS — R41841 Cognitive communication deficit: Secondary | ICD-10-CM | POA: Diagnosis present

## 2017-10-25 NOTE — Therapy (Signed)
[facility] [address] [place], [place], [address] Phone: [phone]   Fax:  [phone]  Occupational Therapy Evaluation  Patient Details  Name: Patrick Ross MRN: [id number] Date of Birth: 12-11-1945 Referring Provider: Dr. Wells Guiles Tat   Encounter Date: 10/25/2017  OT End of Session - 10/25/17 1522    Visit Number  1    Number of Visits  17    Date for OT Re-Evaluation  12/24/17    Authorization Type  Medicare, Tric[employer] for Life    Authorization Time Period  cert. period 10/25/17-01/23/18    Authorization - Visit Number  1    Authorization - Number of Visits  10    OT Start Time  1150    OT Stop Time  7412    OT Time Calculation (min)  45 min    Activity Tolerance  Patient tolerated treatment well    Behavior During Therapy  WFL for tasks assessed/performed;Impulsive       Past Medical History:  Diagnosis Date  . GERD (gastroesophageal reflux disease)   . Hypertension   . Hypothyroidism   . Lumbar stenosis   . OSA (obstructive sleep apnea)   . Parkinson's disease ([facility])   . REM behavioral disorder   . Urinary incontinence     Past Surgical History:  Procedure Laterality Date  . BACK SURGERY    . DEEP BRAIN STIMULATOR PLACEMENT     left 12/27/2012, right 12/10/2011, left revision 02/02/2013, replacement bilateral 11/21/2015  . KNEE SURGERY    . NEPHRECTOMY    . SPINAL FUSION    . TONSILLECTOMY      There were no vitals filed for this visit.  Subjective Assessment - 10/25/17 1155    Subjective   I changed neurologists to Dr. Carles Collet and she wanted me to come back    Patient is accompained by:  Family member   wife   Pertinent History  GERD, HTN, hypothyroidism, lumbar stenosis, OSA, REM behavioral disorder, Urinary incontinence, hx of multiple back surgeries (last 2018 lumbar fusion), s/p bilateral DBS (2013,2014,+revision 2014)    Limitations  fall risk, bilateral deep brain stimulator (DBS), hx of  multiple spinal surgeries    Patient Stated Goals  be able to tie both shoes consistently, be able to go on a hike in October    Currently in Pain?  Yes    Pain Score  2     Pain Location  Back    Pain Orientation  Lower    Pain Descriptors / Indicators  Aching    Pain Type  Chronic pain    Pain Onset  More than a month ago    Pain Frequency  Constant    Aggravating Factors   standing, walking    Pain Relieving Factors  nothing    Effect of Pain on Daily Activities  will monitor but will not directly address due to nature, location        [facility] OT Assessment - 10/25/17 1158      Assessment   Medical Diagnosis  Parkinson's disease    Referring Provider  Dr. Wells Guiles Tat    Onset Date/Surgical Date  09/08/17   MD visit   Hand Dominance  Right    Prior Therapy  [facility] for back pain/strengthening; just ended mid-August 2019, last OT screen 2016, Last OT therapy >3 years ago      Precautions   Precautions  Fall    Precaution Comments  Deep brain stimulation bilaterally      Balance Screen   Has the patient fallen in the past 6 months  Yes    How many times?  [place] expects to be discharged to:  Private residence    Lives With  Spouse      Prior Function   Level of Independence  Independent with household mobility with device;Independent with basic ADLs    Vocation  Retired    Leisure  [employer] 3x/wk for [employer] class, pool exercises with trainer; has walked on treadmill with previous physical therapy (x 10 minutes)   hiking (would like to hike in Oct), program ham radio      ADL   Eating/Feeding  --   swallowing difficulty, wife typically performs   Grooming  Modified independent   difficulty standing long enough to stand, fatigue   Upper Body Bathing  Modified independent    Lower Body Bathing  --   difficulty with feet   Upper Body Dressing  Increased time    Lower Body Dressing  Modified independent   unable to  tie R shoe, lying down, difficulty opening sock   Toilet Transfer  Modified independent   using bars to pull up on   Toileting - Clothing Manipulation  Increased time    Toileting -  Horticulturist, commercial bars;Walk in shower   2 shower, 2 toilet, built in seat    Transfers/Ambulation Related to ADL's  difficulty      IADL   Prior Level of Function Shopping  uses electric cart in stores now    Prior Level of Function Light Housekeeping  wife performs    Prior Level of Function Meal Prep  wife performs    Nurse, children's  --   limited driving   Medication Management  Has difficulty remembering to take medication   Wife performs   Prior Level of Function Financial Management  wife performs now      Public librarian Status  History of falls;Freezing    Mobility Status Comments  Uses 4 wheeled RW in community, currently using no device in the home, but PT recommends walker      Written Expression   Dominant Hand  Right    Handwriting  75% legible   mod-severe micrographia     Vision - History   Baseline Vision  Wears glasses all the time    Additional Comments  Pt reports decr peripheral vision, delay in focus with quick eye movements, depth perception changes      Cognition   Overall Cognitive Status  Impaired/Different from baseline    Area of Impairment  Safety/judgement    Safety/Judgement  Decreased awareness of safety    Memory  Impaired    Awareness  --    Bradyphrenia  Yes    Behaviors  Impulsive   with movement at times     Observation/Other Assessments   Standing Functional Reach Test  R-8", L-9"   with step and CGA   Other Surveys   Select    Physical Performance Test    Yes    Simulated Eating Time (seconds)  12.16   holds spoon at the end   Donning Doffing Jacket Time (seconds)  26.44   decr balance (CGA) and decr safety   Donning Doffing Jacket Comments  Fastening/unfastening 3 buttons in 56.50sec      Posture/Postural Control   Posture/Postural Control  Postural limitations    Postural Limitations  Rounded Shoulders;Forward head   hyperextends at trunk, crossed feet/narrow base of support     Coordination   9 Hole Peg Test  Right;Left    Right 9 Hole Peg Test  33.44    Left 9 Hole Peg Test  29.71    Box and Blocks  R-40 blocks, L-44blocks    Other  dyskinesias noted      Tone   Assessment Location  Right Upper Extremity;Left Upper Extremity      ROM / Strength   AROM / PROM / Strength  AROM      AROM   Overall AROM   Within functional limits for tasks performed   grossly     RUE Tone   RUE Tone  --   ?dystonia     LUE Tone   LUE Tone  --   ?dystonia                     OT Education - 10/25/17 1827    Education Details  OT eval results/POC, safety concerns for driving, recommend sitting to wash feet, typical/common cognitive changes with PD    Person(s) Educated  Patient;Spouse    Methods  Explanation    Comprehension  Verbalized understanding       OT Short Term Goals - 10/25/17 1805      OT SHORT TERM GOAL #1   Title  Pt will be independent with updated PD-specific HEP.--check STGs 11/24/17    Time  4    Period  Weeks    Status  New      OT SHORT TERM GOAL #2   Title  Pt will improve bilateral functional reaching/coordination for ADLs as shown by improving score on box and blocks test by at least 5 blocks bilaterally.    Baseline  R-40, L-44 blocks    Time  8    Period  Weeks    Status  New      OT SHORT TERM GOAL #3   Title  Pt will be able to write address and 2 sentences with good legibility and only min decr in size.    Time  4    Period  Weeks    Status  New      OT SHORT TERM GOAL #4   Title  Pt will be able to tie both shoes mod I.    Time  4    Period  Weeks    Status  New        OT Long Term Goals - 10/25/17 1812      OT LONG TERM GOAL #1   Title  Pt will verbalize  understanding of adaptive strategies for ADLs/IADLs to improve ease, movement quality, safety, and prevent future complications.--check LTGs 12/24/17    Time  8    Period  Weeks    Status  New      OT LONG TERM GOAL #2   Title  Pt will improve balance/functional reaching for IADLs as shown by reaching at least 10 inches with each UE without LOB.    Baseline  R-8", L-9" (with steps and close supervision)    Time  8    Period  Weeks    Status  New      OT LONG TERM GOAL #3   Title  Pt will  be able to stand to complete grooming tasks at counter.    Time  8    Status  New      OT LONG TERM GOAL #4   Title  Pt will improve ability to dress as shown by fastening/unfastening 3 buttons in 46sec or less.    Baseline  56.50sec    Time  8    Period  Weeks    Status  New      OT LONG TERM GOAL #5   Title  Pt will improve dressing as shown by improving time on PPT#4 by at least 5sec with good balance/use of strategies.    Baseline  26.44sec    Time  8    Period  Weeks    Status  New      Long Term Additional Goals   Additional Long Term Goals  Yes      OT LONG TERM GOAL #6   Title  Pt will verbalize understanding of continuing fitness opportunities/appropriate community resources.    Baseline  --    Time  8    Period  Weeks    Status  New            Plan - 10/25/17 1736    Clinical Impression Statement  Pt is a 72 y.o. male with Parkinson's disease.  Pt is familiar to this pt but has not received occupational therapy in >3 years ago.  Pt has experienced decline in function since that time.  Pt with PMH that includes: GERD, HTN, hypothyroidism, lumbar stenosis, OSA, REM behavioral disorder, Urinary incontinence, hx of multiple back surgeries (last 2018 lumbar fusion), s/p bilateral DBS ([id number],+[id number] 2014).  Pt presents today with bradykinesia/hypokinesia, dyskinesias/dystonia, decr coordination, decr balance/functional mobility, and decr posture.  Pt would benefit from  occupational therapy to address these deficits for improved ADL/IADL safety and ease as well as to prevent future complications and improve quality of life.     Occupational Profile and client history currently impacting functional performance  Pt is independent with BADLs, but needs assist for tying shoes at times.  Pt reports decrease in IADL performance (driving, community activities) and would like to return to hiking.  Pt reports decline, particularly after last back surgeries.      Occupational performance deficits (Please refer to evaluation for details):  ADL's;IADL's;Leisure;Social Participation    Rehab Potential  Good    Current Impairments/barriers affecting progress:  hx of multiple back surgeries, severity of deficits    OT Frequency  2x / week    OT Duration  8 weeks   +eval   OT Treatment/Interventions  Self-care/ADL training;Cryotherapy;Paraffin;Therapeutic exercise;DME and/or AE instruction;Functional Mobility Training;Cognitive remediation/compensation;Balance training;Visual/perceptual remediation/compensation;Manual Therapy;Neuromuscular education;Fluidtherapy;Ultrasound;Moist Heat;Energy conservation;Passive range of motion;Therapeutic activities;Patient/family education    Plan  stretches for LB dressing, work on tying shoes and strategies, PWR! supine    Clinical Decision Making  Several treatment options, min-mod task modification necessary    Recommended Other Services  current with PT, ST    Consulted and Agree with Plan of Care  Patient;Family member/caregiver    Family Member Consulted  wife       Patient will benefit from skilled therapeutic intervention in order to improve the following deficits and impairments:  Decreased balance, Decreased mobility, Decreased endurance, Difficulty walking, Impaired vision/preception, Pain, Improper body mechanics, Impaired tone, Impaired perceived functional ability, Decreased cognition, Decreased activity tolerance, Decreased  coordination, Decreased knowledge of use of DME, Decreased safety awareness, Impaired UE functional use, Improper spinal/pelvic  alignment(bradykinesia/hypokinesia)  Visit Diagnosis: Other symptoms and signs involving the nervous system  Abnormal posture  Unsteadiness on feet  Other abnormalities of gait and mobility  Muscle weakness (generalized)  Other symptoms and signs involving the musculoskeletal system  Other lack of coordination  Attention and concentration deficit  Frontal lobe and executive function deficit    Problem List Patient Active Problem List   Diagnosis Date Noted  . 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    [facility] 10/25/2017, 6:29 PM  [facility] [address] [place], [place], [address] Phone: [phone]   Fax:  [phone]  Name: Patrick Ross MRN: [id number] Date of Birth: [birth year]   Vianne Bulls, OTR/L [facility] [address] [place], [place]  [address] [phone] phone [phone] 10/25/17 6:29 PM

## 2017-10-25 NOTE — Therapy (Signed)
[facility] [address] [place], [place], [address] Phone: [phone]   Fax:  [phone]  Physical Therapy Evaluation  Patient Details  Name: Patrick Ross MRN: [id number] Date of Birth: 1945-10-27 Referring Provider: Alonza Bogus, DO   Encounter Date: 10/25/2017  PT End of Session - 10/25/17 1202    Visit Number  1    Number of Visits  18    Date for PT Re-Evaluation  01/23/18    Authorization Type  Medicare, Tric[employer] for Life (Will need 10th visit progress notes)    PT Start Time  1101    PT Stop Time  1150    PT Time Calculation (min)  49 min    Equipment Utilized During Treatment  Gait belt    Activity Tolerance  Patient tolerated treatment well    Behavior During Therapy  WFL for tasks assessed/performed       Past Medical History:  Diagnosis Date  . GERD (gastroesophageal reflux disease)   . Hypertension   . Hypothyroidism   . Lumbar stenosis   . OSA (obstructive sleep apnea)   . Parkinson's disease ([place])   . REM behavioral disorder   . Urinary incontinence     Past Surgical History:  Procedure Laterality Date  . BACK SURGERY    . DEEP BRAIN STIMULATOR PLACEMENT     left 12/27/2012, right 12/10/2011, left revision 02/02/2013, replacement bilateral 11/21/2015  . KNEE SURGERY    . NEPHRECTOMY    . SPINAL FUSION    . TONSILLECTOMY      There were no vitals filed for this visit.   Subjective Assessment - 10/25/17 1105    Subjective  Pt reports "my biggest problem right now is balance."  I have a tendency to fall backwards or pull to the left.  Once my core got stronger, I haven't fallen as much.   At times, I feel my legs get so weak, that they feel they will give out.  I feel "dizziness" at times.  Uses a U-step rollator outdoors and nothing or a cane in the house, but I "bounce off doorways alot".  Pt reports at least 4 falls in the past 6 months.    Patient is accompained by:  Family member    wife   Pertinent History  PD diagnosed 2000, R STN DBS 11/2011, L STN DBS 11/2012 with revision 01/2013; lumbar fusion November 22, 2016, back surgeries, HTN, OSA; dystonia (Botox scheduled in September); receives injections in hips    Limitations  Walking;Standing    Patient Stated Goals  Pt's goals for physical therapy are to be able to go on camping trip in October (walk with walking poles).    Currently in Pain?  Yes    Pain Score  3     Pain Location  Back   into hips   Pain Orientation  Right;Left    Pain Descriptors / Indicators  Aching    Pain Type  Chronic pain    Pain Radiating Towards  hips    Pain Onset  More than a month ago    Pain Frequency  Constant    Aggravating Factors   Too much walking    Pain Relieving Factors  Lying down    Effect of Pain on Daily Activities  PT will monitor pain, but will not address at this time (pain has specifically been addressed previously by [facility])         [facility] PT  Assessment - 10/25/17 1117      Assessment   Medical Diagnosis  Parkinson's disease    Referring Provider  Wells Guiles Tat, DO    Onset Date/Surgical Date  09/08/17   MD visit   Prior Therapy  [facility] for back pain/strengthening; just ended mid-August 2019      Precautions   Precautions  Fall    Precaution Comments  Deep brain stimulation bilaterally      Balance Screen   Has the patient fallen in the past 6 months  Yes    How many times?  4   at least   Has the patient had a decrease in activity level because of a fear of falling?   No    Is the patient reluctant to leave their home because of a fear of falling?   No      Home Social worker  Private residence    Living Arrangements  Spouse/significant other    Available Help at Discharge  Family    Type of [place] entrance    Holiday  Two level    Alternate Level Stairs-Rails  Right;Left;Can reach both    [place] - single point;Walker - 2 wheels;Walker - 4 wheels;Wheelchair - manual;Shower seat;Grab bars - tub/shower   U-Step RW, walking poles     Prior Function   Level of Independence  Independent with household mobility with device    Vocation  Retired    Leisure  [employer] 3x/wk for [employer] class, pool exercises with trainer; has walked on treadmill with previous physical therapy (x 10 minutes)      Observation/Other Assessments   Focus on Therapeutic Outcomes (FOTO)   NA      ROM / Strength   AROM / PROM / Strength  Strength      Strength   Overall Strength  Deficits    Overall Strength Comments  R ankle dorsiflexion 3+/5, L ankle dorsiflexion 4/5; bilateral hip flexion 4+5, bilateral quads 4/5, hamstrings 4+/5   Decr. functional strength, unable to sit<>stand without UEs     Transfers   Transfers  Sit to Stand;Stand to Sit    Sit to Stand  6: Modified independent (Device/Increase time);With upper extremity assist;From chair/3-in-1    Five time sit to stand comments   15.06    Stand to Sit  5: Supervision;With upper extremity assist;To chair/3-in-1;Uncontrolled descent   does not fully turn around to sit   Comments  Pt has increased posterio lean with attempts at sit<>stand, does not have adequate forward excursion through hips with sit<>stand      Ambulation/Gait   Ambulation/Gait  Yes    Ambulation/Gait Assistance  5: Supervision;4: Min guard    Ambulation Distance (Feet)  120 Feet    Assistive device  --   U-step Rolling walker   Gait Pattern  Step-to pattern;Step-through pattern;Decreased step length - right;Decreased step length - left;Decreased dorsiflexion - right;Decreased dorsiflexion - left;Scissoring;Festinating;Narrow base of support    Ambulation Surface  Level;Indoor    Gait velocity  13.85 sec= 2.37 ft/sec      Standardized Balance Assessment   Standardized Balance Assessment  Timed Up and Go Test      Timed Up and Go Test   Normal TUG (seconds)  21.91   14.85  no device   Cognitive TUG (seconds)  22.72   16.28 no device  High Level Balance   High Level Balance Comments  Posterior push and release test:  Pt takes one step, but slow to initiate step in posterior direction                Objective measurements completed on examination: See above findings.            SelfCare:  Discussed results of objective measures in regards to fall risk with mobility, and plans to address with balance-specific exercises.   PT Education - 10/25/17 1200    Education Details  Discussed safety with sit<>stand and with turning to sit transfers:  educated patient in need to fully turn to back up "square up" to chair with walker in front, feel legs on chair, reach back for chair and slowly squat into sitting position (to promote safety and efficiency with transfers, to avoid uncontrolled descent into sitting which may lead to back pain)    Person(s) Educated  Patient;Spouse    Methods  Explanation;Demonstration    Comprehension  Verbalized understanding;Returned demonstration;Verbal cues required;Need further instruction       PT Short Term Goals - 10/25/17 1210      PT SHORT TERM GOAL #1   Title  Pt will be independent with HEP to address balance, functional strengthening and gait.  TARGET:  11/25/17    Time  5    Period  Weeks    Status  New    Target Date  11/25/17      PT SHORT TERM GOAL #2   Title  Pt will improve 5x sit<>stand score to less than or equal to 11.5 seconds (with UE support as needed), for improved transfer efficiency and safety.    Time  5    Period  Weeks    Status  New    Target Date  11/25/17      PT SHORT TERM GOAL #3   Title  Pt will improve TUG score to less than or equal to 18 seconds for decreased fall risk.    Time  5    Period  Weeks    Status  New    Target Date  11/25/17      PT SHORT TERM GOAL #4   Title  3 minute walk test to be assessed, and goal to be written as appropriate.    Time  5     Period  Weeks    Status  New    Target Date  11/25/17      PT SHORT TERM GOAL #5   Title  Berg Balance test to be assessed, and goal to be written as appropriate.    Time  5    Period  Weeks    Status  New    Target Date  11/25/17        PT Long Term Goals - 10/25/17 1215      PT LONG TERM GOAL #1   Title  Pt/family will verbalize understanding of fall prevention in home environment.  TARGET 12/23/17    Time  9    Period  Weeks    Status  New    Target Date  12/23/17      PT LONG TERM GOAL #2   Title  Pt will improve gait velocity to at least 2.5 ft/sec for improved gait efficiency and safety.    Time  9    Period  Weeks    Status  New    Target Date  12/23/17  PT LONG TERM GOAL #3   Title  Pt will ambulate at least 300 ft, indoor/outdoor surfaces, with bilateral walking poles, with supervision, for pt's goal to negotiate short hike for 72th birthday.    Time  9    Period  Weeks    Status  New    Target Date  12/23/17      PT LONG TERM GOAL #4   Title  Pt will improve TUG cognitive score to less than or equal to 18 seconds for decreased fall risk/improved dual tasking with gait.    Time  5    Period  Weeks    Status  New    Target Date  12/23/17      PT LONG TERM GOAL #5   Title  Pt/family will verbalize techniques to reduce festinating with gait and turns.    Time  5    Period  Weeks    Status  New    Target Date  12/23/17             Plan - 10/25/17 1203    Clinical Impression Statement  Pt is a 72 year old male with history of Parkinson's disease (dx 2000), with bilateral deep brain stimulator placement and hx of back surgeries (most recent September 2018 lumbar fusion), who presents to OP PT for multi-disciplinary PT, OT, speech evaluations for Parkinson's deficits.  Pt presents with decreased balance, history of falls (at least 4 in past 6 months), decreased functional strength, bradykinesia/slowed transfers, decreased timing and coordination of  gait, postural instability.  This physical therapist is aware that patient has just finished bout of physical therapy with [facility] specifically addressing back and SI pain; however, pt would benefit from skilled PT to address the above stated deficits in relation to Parkinson's disease in order to improve functional mobility and decrease fall risk.    History and Personal Factors relevant to plan of care:  PMH:  PD 2000, R STN DBS 11/2011, L STN DBS 11/2012 with revision 01/2013, [place], HTN, hypothyroidism, OSA, multiple back surgeries including lumbar fusion 10/2016    Clinical Presentation  Evolving    Clinical Presentation due to:  4 falls in past 6 months; at fall risk per TUG scores, difficulty with transfers and slowed gait velocity; history of pain limiting mobility (has been previously addressed by different PT)    Clinical Decision Making  Moderate    Rehab Potential  Good    Clinical Impairments Affecting Rehab Potential  hx of multiple back surgeries, bilateral DBS, length of time since dx of Parkinson's; pt and wife are motivated for participation in therapy, pt active in community and exercise    PT Frequency  2x / week    PT Duration  Other (comment)   9 weeks   PT Treatment/Interventions  ADLs/Self Care Home Management;DME Instruction;Balance training;Therapeutic exercise;Therapeutic activities;Functional mobility training;Gait training;Neuromuscular re-education;Patient/family education    PT Next Visit Plan  Perform 3 or 6 MWT and set goal as appropriate, perform Berg Balance test; initiate HEP for balance    Consulted and Agree with Plan of Care  Patient;Family member/caregiver    Family Member Consulted  wife       Patient will benefit from skilled therapeutic intervention in order to improve the following deficits and impairments:  Abnormal gait, Decreased balance, Decreased mobility, Decreased strength, Difficulty walking, Postural dysfunction, Decreased safety  awareness  Visit Diagnosis: Other abnormalities of gait and mobility  Unsteadiness on feet  Other symptoms  and signs involving the nervous system  Muscle weakness (generalized)  Abnormal posture     Problem List Patient Active Problem List   Diagnosis Date Noted  . 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 10/25/2017, 12:21 PM  Frazier Butt., PT   [facility] [address] [place], [place], [address] Phone: [phone]   Fax:  [phone]  Name: Patrick Ross MRN: [id number] Date of Birth: 06-20-1945

## 2017-10-25 NOTE — Patient Instructions (Signed)
You need to speak in "choppy speech" to improve your intelligibility  We will work on making this more of a habit for you in therapy

## 2017-10-25 NOTE — Therapy (Signed)
[facility] [address] [place], [place], [address] Phone: [phone]   Fax:  [phone]  Speech Language Pathology Evaluation  Patient Details  Name: Patrick Ross MRN: [id number] Date of Birth: 09-02-1945 Referring Provider: Alonza Bogus, DO   Encounter Date: 10/24/2017  End of Session - 10/25/17 1126    Visit Number  1    Number of Visits  17    Date for SLP Re-Evaluation  01/20/18   90 days   SLP Start Time  1447    SLP Stop Time   1530    SLP Time Calculation (min)  43 min    Activity Tolerance  Patient tolerated treatment well       Past Medical History:  Diagnosis Date  . GERD (gastroesophageal reflux disease)   . Hypertension   . Hypothyroidism   . Lumbar stenosis   . OSA (obstructive sleep apnea)   . Parkinson's disease ([place])   . REM behavioral disorder   . Urinary incontinence     Past Surgical History:  Procedure Laterality Date  . BACK SURGERY    . DEEP BRAIN STIMULATOR PLACEMENT     left 12/27/2012, right 12/10/2011, left revision 02/02/2013, replacement bilateral 11/21/2015  . KNEE SURGERY    . NEPHRECTOMY    . SPINAL FUSION    . TONSILLECTOMY      There were no vitals filed for this visit.  Subjective Assessment - 10/24/17 1431    Subjective  Pt screened last in June 2016 - ST was recommended but pt elected to take ST after back surgery. Pt never contacted clinic after back surgery.         SLP Evaluation [facility] - 10/25/17 1120      SLP Visit Information   SLP Received On  10/24/17    Referring Provider  Tat, Wells Guiles    Onset Date  2000    Medical Diagnosis  Parkinson's disease      Subjective   Patient/Family Stated Goal  be better understood at home and at church/community      General Information   HPI  Pt placed with DBS 2013 and 2014, revision in 2014. Latest screen (2016) indicates WNL volume but some short rushes of speech with ST eval recommended. Pt defered until  after back surgery but never scheduled ST eval.       Balance Screen   Has the patient fallen in the past 6 months  Yes    How many times?  4    Has the patient had a decrease in activity level because of a fear of falling?   No    Is the patient reluctant to leave their home because of a fear of falling?   No      Prior Functional Status   Cognitive/Linguistic Baseline  --   suspect baseline deficits but not assessed today   Type of Home  House     Lives With  Spouse    Vocation  Retired      Associate Professor   Overall Cognitive Status  Difficult to assess   time constraints     Auditory Comprehension   Overall Auditory Comprehension  Appears within functional limits for tasks assessed      Verbal Expression   Overall Verbal Expression  Appears within functional limits for tasks assessed      Oral Motor/Sensory Function   Overall Oral Motor/Sensory Function  Appears within functional limits for tasks assessed  Labial ROM  Within Functional Limits    Labial Symmetry  Within Functional Limits    Labial Strength  Reduced    Labial Coordination  Reduced    Lingual ROM  Reduced right    Lingual Symmetry  Within Functional Limits    Lingual Strength  Reduced Right    Lingual Coordination  Reduced    Velum  Within Functional Limits      Motor Speech   Overall Motor Speech  Impaired    Intelligibility  Intelligibility reduced   wife states <50% of the time, increases with repeats   Sentence  --   70% sentence   Conversation  50-74% accurate   55%, incr'd 75% self correction; 80% with repeats     Effective Techniques  Slow rate;Over-articulate   with reduced rate and overarticulation incr'd to 90%                     SLP Education - 10/25/17 1125    Education Details  slower rate and overarticulation ("choppy speech") is necessary for people to understand pt    Person(s) Educated  Patient;Spouse    Methods  Explanation;Demonstration;Verbal cues    Comprehension   Verbal cues required;Verbalized understanding;Need further instruction;Returned demonstration       SLP Short Term Goals - 10/25/17 1133      SLP SHORT TERM GOAL #1   Title  in sentence resposnes pt will achieve >90% intelligibility with self correction, over three sessions4    Time  4    Period  Weeks      SLP SHORT TERM GOAL #2   Title  in 5 minutes simple conversation, pt will achieve 90% intelligibility with nonverbal cues    Time  4    Period  Weeks    Status  New      SLP SHORT TERM GOAL #3   Title  pt and/or wife will report med administration with puree as more successful than with H2O    Time  4    Period  Weeks    Status  New      SLP SHORT TERM GOAL #4   Title  pt and/or wife will tell SLP of 3 overt s/s aspiration PNA    Time  4    Period  Weeks    Status  New       SLP Long Term Goals - 10/25/17 1138      SLP LONG TERM GOAL #1   Title  pt will achieve 90% intelligibility in 10 minutes simple conversation with nonverbal cues, for three sessions    Time  8    Period  Weeks   or 17 visits, for all LTGs   Status  New      SLP LONG TERM GOAL #2   Title  in 5 minutes simple conversation pt will demo self correction for fast rate/unintelligible speech, to improve intelligiblity to 90%, over three sessions    Time  8    Period  Weeks    Status  New       Plan - 10/25/17 1127    Clinical Impression Statement  Pt presents today with dysarthria, including unintelligible speech caused by imprecise articulation due to fast speech rate. Pt intelligibilty at sentence level was approx 70%, improved after a cue to pt that SLP was having difficulty understanding him. In 7 minutes conversation, pt's intelligibilty was 55%, incr'd to 75% with self correction, and approx 80% with  requested repeats. Pt's wife reports pt's conversational intelligibility at home is <50% and after repeats intelligibility improves but she still has much difficulty understanding pt in that  environment. Pt's loudness was WNL for the evaluation today. Pt commented he thinks his "choppy speech" sounds too slow, but upon SLP hearing that pt uses "choppy speech" sometimes, told pt to use this for sentence responses. Pt was 90% intelligible using "choppy speech" which included slowing speech rate and overarticulating, and he was 100% intelligible with requests for repeats. Pt denies difficulty with meals and took meds today with difficulty taking larger med with H2O. SLP suggested puree. Pt would benefit from skilled ST focusing on pt's goal of improving commnicative ability with family and in the community.    Speech Therapy Frequency  2x / week    Duration  --   8 weeks or 17 visits   Treatment/Interventions  Aspiration precaution training;SLP instruction and feedback;Compensatory strategies;Patient/family education;Internal/external aids;Multimodal communcation approach;Functional tasks;Cognitive reorganization;Oral motor exercises    Potential to Achieve Goals  Fair    Potential Considerations  Severity of impairments;Ability to learn/carryover information    Consulted and Agree with Plan of Care  Patient;Family member/caregiver    Family Member Consulted  wife       Patient will benefit from skilled therapeutic intervention in order to improve the following deficits and impairments:   Dysarthria and anarthria  Cognitive communication deficit    Problem List Patient Active Problem List   Diagnosis Date Noted  . 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 [place], [place], [address] Phone: [phone]   Fax:  [phone]  Name: Patrick Ross MRN: [id number] Date of Birth: 07/28/1945

## 2017-10-27 ENCOUNTER — Ambulatory Visit: Payer: Medicare Other | Admitting: Physical Therapy

## 2017-10-27 ENCOUNTER — Ambulatory Visit: Payer: Medicare Other | Admitting: Speech Pathology

## 2017-10-27 ENCOUNTER — Encounter: Payer: Self-pay | Admitting: Physical Therapy

## 2017-10-27 DIAGNOSIS — R2689 Other abnormalities of gait and mobility: Secondary | ICD-10-CM

## 2017-10-27 DIAGNOSIS — R471 Dysarthria and anarthria: Secondary | ICD-10-CM | POA: Diagnosis not present

## 2017-10-27 DIAGNOSIS — R29818 Other symptoms and signs involving the nervous system: Secondary | ICD-10-CM

## 2017-10-27 DIAGNOSIS — R2681 Unsteadiness on feet: Secondary | ICD-10-CM

## 2017-10-27 NOTE — Therapy (Signed)
Patrick Ross [address] [place], [place], [address] Phone: [phone]   Fax:  [phone]  Speech Language Pathology Treatment  Patient Details  Name: Patrick Ross MRN: [id number] Date of Birth: June 07, 1945 Referring Provider: Alonza Bogus   Encounter Date: 10/27/2017  End of Session - 10/27/17 1707    Visit Number  2    Number of Visits  17    Date for SLP Re-Evaluation  01/20/18    SLP Start Time  7106    SLP Stop Time   1400    SLP Time Calculation (min)  43 min    Activity Tolerance  Patient tolerated treatment well       Past Medical History:  Diagnosis Date  . GERD (gastroesophageal reflux disease)   . Hypertension   . Hypothyroidism   . Lumbar stenosis   . OSA (obstructive sleep apnea)   . Parkinson's disease ([place])   . REM behavioral disorder   . Urinary incontinence     Past Surgical History:  Procedure Laterality Date  . BACK SURGERY    . DEEP BRAIN STIMULATOR PLACEMENT     left 12/27/2012, right 12/10/2011, left revision 02/02/2013, replacement bilateral 11/21/2015  . KNEE SURGERY    . NEPHRECTOMY    . SPINAL FUSION    . TONSILLECTOMY      There were no vitals filed for this visit.  Subjective Assessment - 10/27/17 1318    Subjective  "Patrick Ross is my driver and interpreter."    Patient is accompained by:  --   Patrick Ross, friend   Currently in Pain?  Yes    Pain Score  5     Pain Location  Back    Pain Orientation  Lower    Pain Descriptors / Indicators  Aching    Pain Type  Chronic pain    Pain Radiating Towards  right hip            ADULT SLP TREATMENT - 10/27/17 1315      General Information   Behavior/Cognition  Alert;Cooperative      Treatment Provided   Treatment provided  Cognitive-Linquistic      Pain Assessment   Pain Assessment  No/denies pain      Cognitive-Linquistic Treatment   Treatment focused on  Dysarthria    Skilled Treatment  Pt entered with rapid speech  rate, intelligibility was 60% for this unfamiliar listener. SLP cued pt to use what he calls "choppy speech" with immediate improvement in intelligibility. Pt required occasional min A in sentence level tasks for overarticulation and slower rate, for >95% intelligibility. Between tasks, pt required usual min A to carry over these strategies to spontaneous responses and conversation.       Assessment / Recommendations / Plan   Plan  Continue with current plan of care      Progression Toward Goals   Progression toward goals  Progressing toward goals       SLP Education - 10/27/17 1706    Education Details  slow rate, overarticulation, pause    Person(s) Educated  Patient    Methods  Explanation;Demonstration;Verbal cues    Comprehension  Verbalized understanding;Returned demonstration;Verbal cues required;Need further instruction       SLP Short Term Goals - 10/27/17 1717      SLP SHORT TERM GOAL #1   Title  in sentence resposnes pt will achieve >90% intelligibility with self correction, over three sessions    Time  4  Period  Weeks    Status  On-going      SLP SHORT TERM GOAL #2   Title  in 5 minutes simple conversation, pt will achieve 90% intelligibility with nonverbal cues    Time  4    Period  Weeks    Status  On-going      SLP SHORT TERM GOAL #3   Title  pt and/or wife will report med administration with puree as more successful than with H2O    Time  4    Period  Weeks    Status  On-going      SLP SHORT TERM GOAL #4   Title  pt and/or wife will tell SLP of 3 overt s/s aspiration PNA    Time  4    Period  Weeks    Status  On-going       SLP Long Term Goals - 10/27/17 1717      SLP LONG TERM GOAL #1   Title  pt will achieve 90% intelligibility in 10 minutes simple conversation with nonverbal cues, for three sessions    Time  8    Period  Weeks   or 17 visits, for all LTG   Status  On-going      SLP LONG TERM GOAL #2   Title  in 5 minutes simple  conversation pt will demo self correction for fast rate/unintelligible speech, to improve intelligiblity to 90%, over three sessions    Time  8    Period  Weeks    Status  On-going       Plan - 10/27/17 1712    Clinical Impression Statement  Pt presents today with dysarthria, including unintelligible speech caused by imprecise articulation due to fast speech rate. Vocal intensity was WNL throughout session today. Intelligibility prior to SLP cuing for "choppy speech" was approximately 60%. In sentence level tasks pt intelligibility was >95% with occasional min A. Usual min A required for carryover to conversation between structured tasks. Pt agreed this improves his intelligibility but said he feels his speech sounds "unnatural." Pt would benefit from skilled ST focusing on pt's goal of improving commnicative ability with family and in the community.    Speech Therapy Frequency  2x / week    Treatment/Interventions  Aspiration precaution training;SLP instruction and feedback;Compensatory strategies;Patient/family education;Internal/external aids;Multimodal communcation approach;Functional tasks;Cognitive reorganization;Oral motor exercises    Potential to Achieve Goals  Fair    Potential Considerations  Severity of impairments;Ability to learn/carryover information    Consulted and Agree with Plan of Care  Patient;Other (Comment)    Family Member Consulted  friend Patrick Ross       Patient will benefit from skilled therapeutic intervention in order to improve the following deficits and impairments:   Dysarthria and anarthria    Problem List Patient Active Problem List   Diagnosis Date Noted  . 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

## 2017-10-28 NOTE — Therapy (Signed)
[facility] [address] [place], [place], [address] Phone: [phone]   Fax:  [phone]  Physical Therapy Treatment  Patient Details  Name: Patrick Ross MRN: [id number] Date of Birth: 1945-04-20 Referring Provider: Dr. Wells Guiles Tat   Encounter Date: 10/27/2017  PT End of Session - 10/28/17 1640    Visit Number  2    Number of Visits  18    Date for PT Re-Evaluation  01/23/18    Authorization Type  Medicare, Tricare for Life (Will need 10th visit progress notes)    PT Start Time  1403    PT Stop Time  1446    PT Time Calculation (min)  43 min    Equipment Utilized During Treatment  Gait belt    Activity Tolerance  Patient tolerated treatment well    Behavior During Therapy  [facility] for tasks assessed/performed       Past Medical History:  Diagnosis Date  . GERD (gastroesophageal reflux disease)   . Hypertension   . Hypothyroidism   . Lumbar stenosis   . OSA (obstructive sleep apnea)   . Parkinson's disease ([place])   . REM behavioral disorder   . Urinary incontinence     Past Surgical History:  Procedure Laterality Date  . BACK SURGERY    . DEEP BRAIN STIMULATOR PLACEMENT     left 12/27/2012, right 12/10/2011, left revision 02/02/2013, replacement bilateral 11/21/2015  . KNEE SURGERY    . NEPHRECTOMY    . SPINAL FUSION    . TONSILLECTOMY      There were no vitals filed for this visit.  Subjective Assessment - 10/27/17 1404    Subjective  I'm very swimmy headed today, and peripheral vision is very blurry today.  I was unsteady walking to the truck and wife had to help me.    Patient is accompained by:  --   Devoria Albe   Pertinent History  PD diagnosed 2000, R STN DBS 11/2011, L STN DBS 11/2012 with revision 01/2013; lumbar fusion November 22, 2016, back surgeries, HTN, OSA; dystonia (Botox scheduled in September); receives injections in hips    Limitations  Walking;Standing    Patient Stated Goals   Pt's goals for physical therapy are to be able to go on camping trip in October (walk with walking poles).    Currently in Pain?  Yes    Pain Score  5     Pain Location  Back    Pain Orientation  Lower    Pain Descriptors / Indicators  Aching    Pain Type  Chronic pain    Pain Onset  More than a month ago    Pain Frequency  Constant    Aggravating Factors   standing, walking    Pain Relieving Factors  nothing         [facility] PT Assessment - 10/28/17 1622      6 minute walk test results    Aerobic Endurance Distance Walked  1063   1876 ft is age-related normal for 30-68 year old males   Endurance additional comments  with U-step RW;       Standardized Balance Assessment   Standardized Balance Assessment  Berg Balance Test      Berg Balance Test   Sit to Stand  Able to stand  independently using hands    Standing Unsupported  Able to stand 2 minutes with supervision    Sitting with Back Unsupported but Feet Supported on Floor  or Stool  Able to sit safely and securely 2 minutes    Stand to Sit  Controls descent by using hands    Transfers  Able to transfer safely, definite need of hands    Standing Unsupported with Eyes Closed  Able to stand 10 seconds with supervision   increased sway   Standing Ubsupported with Feet Together  Needs help to attain position but able to stand for 30 seconds with feet together    From Standing, Reach Forward with Outstretched Arm  Reaches forward but needs supervision    From Standing Position, Pick up Object from Floor  Unable to try/needs assist to keep balance    From Standing Position, Turn to Look Behind Over each Shoulder  Looks behind from both sides and weight shifts well    Turn 360 Degrees  Needs assistance while turning    Standing Unsupported, Alternately Place Feet on Step/Stool  Needs assistance to keep from falling or unable to try    Standing Unsupported, One Foot in [employer] balance while stepping or standing    Standing on One  Leg  Unable to try or needs assist to prevent fall    Total Score  25    Berg comment:  Scores <45/56 indicate increased fall risk.         Vestibular Assessment - 10/28/17 0001      Vestibular Assessment   General Observation  Pt c/o swimmy-headedness and blurred peripheral vision      Symptom Behavior   Type of Dizziness  Blurred vision   swimmy-headed, blurred peripheral vision, unsteadiness     Occulomotor Exam   Comment  Pt has difficulty with tracking horizontally returning from right to midline      Vestibulo-Occular Reflex   VOR 1 Head Only (x 1 viewing)  Performs in standing horizontal x 10, vertical x 10, cues for smaller ranges of head motion.  General report of unsteady feeling.   On plain background, targe 3 feet away   Comment  Pt c/o with smooth pursuits and looking at target that busy background makes it hard to focus.      Visual Acuity   Static  Line 6    Dynamic  Line 1                       PT Education - 10/28/17 1636    Education Details  Fall risk per Merrilee Jansky, discussed 6MWT and visual/vestibular testing results    Person(s) Educated  Patient;Other (comment)   Friend   Methods  Explanation    Comprehension  Verbalized understanding       PT Short Term Goals - 10/28/17 1642      PT SHORT TERM GOAL #1   Title  Pt will be independent with HEP to address balance, functional strengthening and gait.  TARGET:  11/25/17    Time  5    Period  Weeks    Status  New      PT SHORT TERM GOAL #2   Title  Pt will improve 5x sit<>stand score to less than or equal to 11.5 seconds (with UE support as needed), for improved transfer efficiency and safety.    Time  5    Period  Weeks    Status  New      PT SHORT TERM GOAL #3   Title  Pt will improve TUG score to less than or equal to 18 seconds for decreased  fall risk.    Time  5    Period  Weeks    Status  New      PT SHORT TERM GOAL #4   Title  Pt will improve 6 MWT distance by 100 ft, with  less than 2 points increase on pain scale for low back pain, for improved gait efficiency and safety.    Baseline  1063 ft in 6 MWT 10/27/17    Time  5    Period  Weeks    Status  New      PT SHORT TERM GOAL #5   Title  Berg Balance test to improve by at least 5 points, for decreased fall risk.    Baseline  Berg 25/56 10/27/17    Time  5    Period  Weeks    Status  New        PT Long Term Goals - 10/25/17 1215      PT LONG TERM GOAL #1   Title  Pt/family will verbalize understanding of fall prevention in home environment.  TARGET 12/23/17    Time  9    Period  Weeks    Status  New    Target Date  12/23/17      PT LONG TERM GOAL #2   Title  Pt will improve gait velocity to at least 2.5 ft/sec for improved gait efficiency and safety.    Time  9    Period  Weeks    Status  New    Target Date  12/23/17      PT LONG TERM GOAL #3   Title  Pt will ambulate at least 300 ft, indoor/outdoor surfaces, with bilateral walking poles, with supervision, for pt's goal to negotiate short hike for 72th birthday.    Time  9    Period  Weeks    Status  New    Target Date  12/23/17      PT LONG TERM GOAL #4   Title  Pt will improve TUG cognitive score to less than or equal to 18 seconds for decreased fall risk/improved dual tasking with gait.    Time  5    Period  Weeks    Status  New    Target Date  12/23/17      PT LONG TERM GOAL #5   Title  Pt/family will verbalize techniques to reduce festinating with gait and turns.    Time  5    Period  Weeks    Status  New    Target Date  12/23/17            Plan - 10/28/17 1640    Clinical Impression Statement  Discussed results of 6MWT in relation to age related norms (pt's distance 1063, norms >1800 ft) and pt (pt c/o no increase in pain in 6 MWT), using rollator walker.  Assessed Berg Balance test, with pt at fall risk with 25/56 score.  Also looked at pt's reports of unsteadiness/"swimmy-headed" complaints (which is longstanding, but  wanted to assess for visual-vestibular issues).  Pt has difficulty with horizontal tracking back to midline, and has difficulty with dynamic visual acuity.  Pt will benefit from skilled PT to comprehensively address balance, functional stregnthening and gait training for overall improved safety with mobility and decreased fall risk.    Rehab Potential  Good    Clinical Impairments Affecting Rehab Potential  hx of multiple back surgeries, bilateral DBS, length of time since dx of Parkinson's;  pt and wife are motivated for participation in therapy, pt active in community and exercise    PT Frequency  2x / week    PT Duration  Other (comment)   9 weeks   PT Treatment/Interventions  ADLs/Self Care Home Management;DME Instruction;Balance training;Therapeutic exercise;Therapeutic activities;Functional mobility training;Gait training;Neuromuscular re-education;Patient/family education    PT Next Visit Plan  Initiate HEP for balance, x1 viewing, functional strengthening and gait training    Consulted and Agree with Plan of Care  Patient;Family member/caregiver    Family Member Consulted  wife       Patient will benefit from skilled therapeutic intervention in order to improve the following deficits and impairments:  Abnormal gait, Decreased balance, Decreased mobility, Decreased strength, Difficulty walking, Postural dysfunction, Decreased safety awareness  Visit Diagnosis: Other abnormalities of gait and mobility  Unsteadiness on feet  Other symptoms and signs involving the nervous system     Problem List Patient Active Problem List   Diagnosis Date Noted  . 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 10/28/2017, 4:44 PM  Frazier Butt., PT   [facility] [address] [place], [place], [address] Phone: [phone]   Fax:  [phone]  Name: Patrick Ross MRN: [id number] Date of Birth: [birth year]

## 2017-11-01 ENCOUNTER — Encounter: Payer: Self-pay | Admitting: Occupational Therapy

## 2017-11-01 ENCOUNTER — Ambulatory Visit: Payer: Medicare Other | Attending: Neurology | Admitting: Occupational Therapy

## 2017-11-01 DIAGNOSIS — R41841 Cognitive communication deficit: Secondary | ICD-10-CM | POA: Insufficient documentation

## 2017-11-01 DIAGNOSIS — R471 Dysarthria and anarthria: Secondary | ICD-10-CM | POA: Diagnosis present

## 2017-11-01 DIAGNOSIS — M6281 Muscle weakness (generalized): Secondary | ICD-10-CM | POA: Insufficient documentation

## 2017-11-01 DIAGNOSIS — R29898 Other symptoms and signs involving the musculoskeletal system: Secondary | ICD-10-CM | POA: Diagnosis present

## 2017-11-01 DIAGNOSIS — R29818 Other symptoms and signs involving the nervous system: Secondary | ICD-10-CM

## 2017-11-01 DIAGNOSIS — R2689 Other abnormalities of gait and mobility: Secondary | ICD-10-CM | POA: Insufficient documentation

## 2017-11-01 DIAGNOSIS — R2681 Unsteadiness on feet: Secondary | ICD-10-CM | POA: Diagnosis present

## 2017-11-01 DIAGNOSIS — R41844 Frontal lobe and executive function deficit: Secondary | ICD-10-CM | POA: Insufficient documentation

## 2017-11-01 DIAGNOSIS — R41840 Attention and concentration deficit: Secondary | ICD-10-CM

## 2017-11-01 DIAGNOSIS — R293 Abnormal posture: Secondary | ICD-10-CM | POA: Diagnosis present

## 2017-11-01 DIAGNOSIS — R278 Other lack of coordination: Secondary | ICD-10-CM | POA: Insufficient documentation

## 2017-11-01 NOTE — Therapy (Signed)
[facility] [address] [place], [place], [address] Phone: [phone]   Fax:  [phone]  Occupational Therapy Treatment  Patient Details  Name: Patrick Ross MRN: [id number] Date of Birth: 1946-02-03 Referring Provider: Dr. Wells Guiles Tat   Encounter Date: 11/01/2017  OT End of Session - 11/01/17 1409    Visit Number  2    Number of Visits  17    Date for OT Re-Evaluation  12/24/17    Authorization Type  Medicare, Tricare for Life    Authorization Time Period  cert. period 10/25/17-01/23/18    Authorization - Visit Number  2    Authorization - Number of Visits  10    OT Start Time  1062    OT Stop Time  1445    OT Time Calculation (min)  40 min    Activity Tolerance  Patient tolerated treatment well    Behavior During Therapy  [facility] for tasks assessed/performed;Impulsive       Past Medical History:  Diagnosis Date  . GERD (gastroesophageal reflux disease)   . Hypertension   . Hypothyroidism   . Lumbar stenosis   . OSA (obstructive sleep apnea)   . Parkinson's disease ([place])   . REM behavioral disorder   . Urinary incontinence     Past Surgical History:  Procedure Laterality Date  . BACK SURGERY    . DEEP BRAIN STIMULATOR PLACEMENT     left 12/27/2012, right 12/10/2011, left revision 02/02/2013, replacement bilateral 11/21/2015  . KNEE SURGERY    . NEPHRECTOMY    . SPINAL FUSION    . TONSILLECTOMY      There were no vitals filed for this visit.  Subjective Assessment - 11/01/17 1408    Subjective   Pt reports that he has been feeling loopy the last couple of days  "feel loopy, like I am on drugs"    Patient is accompained by:  Family member   wife   Pertinent History  GERD, HTN, hypothyroidism, lumbar stenosis, OSA, REM behavioral disorder, Urinary incontinence, hx of multiple back surgeries (last 2018 lumbar fusion), s/p bilateral DBS (2013,2014,+revision 2014)    Limitations  fall risk, bilateral  deep brain stimulator (DBS), hx of multiple spinal surgeries    Patient Stated Goals  be able to tie both shoes consistently, be able to go on a hike in October    Currently in Pain?  No/denies    Pain Onset  More than a month ago        In sitting, stretches in prep for LB dressing:  Cross legs, reach for floor, reach to each foot in diagonal pattern for incr core stability and flexibility.  Pt needed min cueing for breath control, difficulty with sitting balance for this.    Pt able to don/doff both shoes and tie with incr time.  Cued pt for breathing.  Pt needed time to recover due to imbalance after position changes.  PT to address vestibular difficulties further.  (no true dizziness reported).  In supine, PWR! Up and rock for improved posture and wt. Shift/rotation at hips with min-mod cueing.  Discussed how current movement patterns create incr stress on spine with decr spatial awareness and discussed neutral spine with activities as able.  Sit>stand with emphasis on bending at the hips and avoiding hyperextension of trunk/spine with min-mod cueing.  Pt also needed mod  cueing to avoid crossing feet, or hooking feet behind him in sitting to compensate for decr core  stability.    Shoulder flex with BUEs with ball in sitting with focus on neutral spine/posture with min-mod cueing (tactile).                       OT Short Term Goals - 10/25/17 1805      OT SHORT TERM GOAL #1   Title  Pt will be independent with updated PD-specific HEP.--check STGs 11/24/17    Time  4    Period  Weeks    Status  New      OT SHORT TERM GOAL #2   Title  Pt will improve bilateral functional reaching/coordination for ADLs as shown by improving score on box and blocks test by at least 5 blocks bilaterally.    Baseline  R-40, L-44 blocks    Time  8    Period  Weeks    Status  New      OT SHORT TERM GOAL #3   Title  Pt will be able to write address and 2 sentences with good  legibility and only min decr in size.    Time  4    Period  Weeks    Status  New      OT SHORT TERM GOAL #4   Title  Pt will be able to tie both shoes mod I.    Time  4    Period  Weeks    Status  New        OT Long Term Goals - 10/25/17 1812      OT LONG TERM GOAL #1   Title  Pt will verbalize understanding of adaptive strategies for ADLs/IADLs to improve ease, movement quality, safety, and prevent future complications.--check LTGs 12/24/17    Time  8    Period  Weeks    Status  New      OT LONG TERM GOAL #2   Title  Pt will improve balance/functional reaching for IADLs as shown by reaching at least 10 inches with each UE without LOB.    Baseline  R-8", L-9" (with steps and close supervision)    Time  8    Period  Weeks    Status  New      OT LONG TERM GOAL #3   Title  Pt will be able to stand to complete grooming tasks at counter.    Time  8    Status  New      OT LONG TERM GOAL #4   Title  Pt will improve ability to dress as shown by fastening/unfastening 3 buttons in 46sec or less.    Baseline  56.50sec    Time  8    Period  Weeks    Status  New      OT LONG TERM GOAL #5   Title  Pt will improve dressing as shown by improving time on PPT#4 by at least 5sec with good balance/use of strategies.    Baseline  26.44sec    Time  8    Period  Weeks    Status  New      Long Term Additional Goals   Additional Long Term Goals  Yes      OT LONG TERM GOAL #6   Title  Pt will verbalize understanding of continuing fitness opportunities/appropriate community resources.    Baseline  --    Time  8    Period  Weeks    Status  New  Plan - 11/01/17 1502    Clinical Impression Statement  Pt demo visual spatial and spatial awareness for body impacting posture and functional mobility.  Pt will benefit from continued cueing for this.      Occupational Profile and client history currently impacting functional performance  Pt is independent with BADLs, but needs  assist for tying shoes at times.  Pt reports decrease in IADL performance (driving, community activities) and would like to return to hiking.  Pt reports decline, particularly after last back surgeries.      Occupational performance deficits (Please refer to evaluation for details):  ADL's;IADL's;Leisure;Social Participation    Rehab Potential  Good    Current Impairments/barriers affecting progress:  hx of multiple back surgeries, severity of deficits    OT Frequency  2x / week    OT Duration  8 weeks   +eval   OT Treatment/Interventions  Self-care/ADL training;Cryotherapy;Paraffin;Therapeutic exercise;DME and/or AE instruction;Functional Mobility Training;Cognitive remediation/compensation;Balance training;Visual/perceptual remediation/compensation;Manual Therapy;Neuromuscular education;Fluidtherapy;Ultrasound;Moist Heat;Energy conservation;Passive range of motion;Therapeutic activities;Patient/family education    Plan  posture, proper positioning for ADL tasks, core stability, ? quadraped, coordination/reaching    Clinical Decision Making  Several treatment options, min-mod task modification necessary    Recommended Other Services  current with PT, ST    Consulted and Agree with Plan of Care  Patient;Family member/caregiver    Family Member Consulted  wife       Patient will benefit from skilled therapeutic intervention in order to improve the following deficits and impairments:  Decreased balance, Decreased mobility, Decreased endurance, Difficulty walking, Impaired vision/preception, Pain, Improper body mechanics, Impaired tone, Impaired perceived functional ability, Decreased cognition, Decreased activity tolerance, Decreased coordination, Decreased knowledge of use of DME, Decreased safety awareness, Impaired UE functional use, Improper spinal/pelvic alignment(bradykinesia/hypokinesia)  Visit Diagnosis: Other symptoms and signs involving the nervous system  Abnormal posture  Other  symptoms and signs involving the musculoskeletal system  Other lack of coordination  Attention and concentration deficit  Frontal lobe and executive function deficit  Other abnormalities of gait and mobility  Unsteadiness on feet    Problem List Patient Active Problem List   Diagnosis Date Noted  . 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 [place], [place], [address] Phone: [phone]   Fax:  [phone]  Name: Patrick Ross MRN: [id number] Date of Birth: 10-25-1945   Vianne Bulls, OTR/L [facility] [address] [place], [place]  [address] [phone] phone [phone] 11/01/17 3:09 PM

## 2017-11-03 ENCOUNTER — Encounter: Payer: Self-pay | Admitting: Physical Therapy

## 2017-11-03 ENCOUNTER — Encounter: Payer: Self-pay | Admitting: Occupational Therapy

## 2017-11-03 ENCOUNTER — Ambulatory Visit: Payer: Medicare Other | Admitting: Occupational Therapy

## 2017-11-03 ENCOUNTER — Ambulatory Visit (HOSPITAL_BASED_OUTPATIENT_CLINIC_OR_DEPARTMENT_OTHER): Payer: Medicare Other | Admitting: Physical Medicine & Rehabilitation

## 2017-11-03 ENCOUNTER — Encounter: Payer: Medicare Other | Attending: Physical Medicine & Rehabilitation

## 2017-11-03 ENCOUNTER — Ambulatory Visit: Payer: Medicare Other | Admitting: Physical Therapy

## 2017-11-03 ENCOUNTER — Ambulatory Visit: Payer: Medicare Other | Admitting: Speech Pathology

## 2017-11-03 ENCOUNTER — Encounter: Payer: Self-pay | Admitting: Physical Medicine & Rehabilitation

## 2017-11-03 VITALS — BP 165/104 | HR 87 | Ht 75.0 in | Wt 224.0 lb

## 2017-11-03 VITALS — BP 142/91 | HR 88

## 2017-11-03 DIAGNOSIS — R29818 Other symptoms and signs involving the nervous system: Secondary | ICD-10-CM

## 2017-11-03 DIAGNOSIS — M48061 Spinal stenosis, lumbar region without neurogenic claudication: Secondary | ICD-10-CM | POA: Insufficient documentation

## 2017-11-03 DIAGNOSIS — G4733 Obstructive sleep apnea (adult) (pediatric): Secondary | ICD-10-CM | POA: Diagnosis not present

## 2017-11-03 DIAGNOSIS — R2681 Unsteadiness on feet: Secondary | ICD-10-CM

## 2017-11-03 DIAGNOSIS — K219 Gastro-esophageal reflux disease without esophagitis: Secondary | ICD-10-CM | POA: Insufficient documentation

## 2017-11-03 DIAGNOSIS — Z79899 Other long term (current) drug therapy: Secondary | ICD-10-CM | POA: Insufficient documentation

## 2017-11-03 DIAGNOSIS — E039 Hypothyroidism, unspecified: Secondary | ICD-10-CM | POA: Insufficient documentation

## 2017-11-03 DIAGNOSIS — G248 Other dystonia: Secondary | ICD-10-CM | POA: Insufficient documentation

## 2017-11-03 DIAGNOSIS — R278 Other lack of coordination: Secondary | ICD-10-CM

## 2017-11-03 DIAGNOSIS — R41844 Frontal lobe and executive function deficit: Secondary | ICD-10-CM

## 2017-11-03 DIAGNOSIS — R293 Abnormal posture: Secondary | ICD-10-CM

## 2017-11-03 DIAGNOSIS — R29898 Other symptoms and signs involving the musculoskeletal system: Secondary | ICD-10-CM

## 2017-11-03 DIAGNOSIS — Z79890 Hormone replacement therapy (postmenopausal): Secondary | ICD-10-CM | POA: Diagnosis not present

## 2017-11-03 DIAGNOSIS — R471 Dysarthria and anarthria: Secondary | ICD-10-CM

## 2017-11-03 DIAGNOSIS — R41841 Cognitive communication deficit: Secondary | ICD-10-CM

## 2017-11-03 DIAGNOSIS — G20 Parkinson's disease: Secondary | ICD-10-CM | POA: Insufficient documentation

## 2017-11-03 DIAGNOSIS — I10 Essential (primary) hypertension: Secondary | ICD-10-CM | POA: Insufficient documentation

## 2017-11-03 DIAGNOSIS — R2689 Other abnormalities of gait and mobility: Secondary | ICD-10-CM

## 2017-11-03 DIAGNOSIS — M6281 Muscle weakness (generalized): Secondary | ICD-10-CM

## 2017-11-03 DIAGNOSIS — R41840 Attention and concentration deficit: Secondary | ICD-10-CM

## 2017-11-03 NOTE — Therapy (Signed)
[facility] [address] [place], [place], [address] Phone: [phone]   Fax:  [phone]  Speech Language Pathology Treatment  Patient Details  Name: Patrick Ross MRN: [id number] Date of Birth: June 05, 1945 Referring Provider: Alonza Bogus   Encounter Date: 11/03/2017  End of Session - 11/03/17 1843    Visit Number  3    Number of Visits  17    Date for SLP Re-Evaluation  01/20/18    SLP Start Time  7253    SLP Stop Time   1530    SLP Time Calculation (min)  45 min    Activity Tolerance  Patient tolerated treatment well       Past Medical History:  Diagnosis Date  . GERD (gastroesophageal reflux disease)   . Hypertension   . Hypothyroidism   . Lumbar stenosis   . OSA (obstructive sleep apnea)   . Parkinson's disease ([place])   . REM behavioral disorder   . Urinary incontinence     Past Surgical History:  Procedure Laterality Date  . BACK SURGERY    . DEEP BRAIN STIMULATOR PLACEMENT     left 12/27/2012, right 12/10/2011, left revision 02/02/2013, replacement bilateral 11/21/2015  . KNEE SURGERY    . NEPHRECTOMY    . SPINAL FUSION    . TONSILLECTOMY      There were no vitals filed for this visit.  Subjective Assessment - 11/03/17 1446    Subjective  "When the teacher's watching, you gotta do it right."     Patient is accompained by:  Family member   Jackelyn Poling, wife   Currently in Pain?  Yes    Pain Score  3     Pain Location  Back    Pain Orientation  Lower    Pain Descriptors / Indicators  Aching;Dull    Pain Type  Chronic pain    Pain Onset  More than a month ago            ADULT SLP TREATMENT - 11/03/17 1445      General Information   Behavior/Cognition  Alert;Cooperative      Treatment Provided   Treatment provided  Cognitive-Linquistic      Cognitive-Linquistic Treatment   Treatment focused on  Dysarthria    Skilled Treatment  Pt intermittently self-monitoring and adjusting rate  of speech during initial conversation with SLP. Pt reported reading his daily devotional out loud and trying to practice slow rate. When pt demo'd this to SLP, noted accelerating rate and hypoarticulation after initial 1-2 sentences. SLP told pt and his wife that pt should focus his efforts in practicing speech tasks as assigned by SLP (sentence level most appropriate at this time) but that task hierachy will progress to Real reading and conversation as pt progresses. In sentence level tasks, SLP used cues for both slow rate and over-articulation. SLP used pt's phone to record him during sentence level tasks while employing over-articulation because pt felt he "looked unnatural." Upon seeing the video, pt agreed that he did not look as unnatural as he felt. Occasional min-mod A (verbal cues, demonstration) required in sentence tasks with cognitive load.      Assessment / Recommendations / Plan   Plan  Continue with current plan of care      Progression Toward Goals   Progression toward goals  Progressing toward goals       SLP Education - 11/03/17 1842    Education Details  Pt should focus home  practice on hierarchical tasks assigned by SLP    Person(s) Educated  Patient    Methods  Explanation    Comprehension  Verbalized understanding       SLP Short Term Goals - 11/03/17 1845      SLP SHORT TERM GOAL #1   Title  in sentence resposnes pt will achieve >90% intelligibility with self correction, over three sessions    Time  3    Period  Weeks    Status  On-going      SLP SHORT TERM GOAL #2   Title  in 5 minutes simple conversation, pt will achieve 90% intelligibility with nonverbal cues    Time  3    Period  Weeks    Status  On-going      SLP SHORT TERM GOAL #3   Title  pt and/or wife will report med administration with puree as more successful than with H2O    Time  3    Period  Weeks    Status  On-going      SLP SHORT TERM GOAL #4   Title  pt and/or wife will tell  SLP of 3 overt s/s aspiration PNA    Time  3    Period  Weeks    Status  On-going       SLP Long Term Goals - 11/03/17 1846      SLP LONG TERM GOAL #1   Title  pt will achieve 90% intelligibility in 10 minutes simple conversation with nonverbal cues, for three sessions    Time  7    Period  Weeks   or 17 visits, for all LTG   Status  On-going      SLP LONG TERM GOAL #2   Title  in 5 minutes simple conversation pt will demo self correction for fast rate/unintelligible speech, to improve intelligiblity to 90%, over three sessions    Time  7    Period  Weeks    Status  On-going       Plan - 11/03/17 1843    Clinical Impression Statement  Pt presents today with dysarthria, including unintelligible speech 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 Vocal intensity was WNL throughout session today. In sentence level tasks pt intelligibility was >95% with occasional min A (min-mod A with cognitive load). Usual min A required for carryover to conversation between structured tasks. Pt would benefit from skilled ST focusing on pt's goal of improving commnicative ability with family and in the community.    Speech Therapy Frequency  2x / week    Treatment/Interventions  Aspiration precaution training;SLP instruction and feedback;Compensatory strategies;Patient/family education;Internal/external aids;Multimodal communcation approach;Functional tasks;Cognitive reorganization;Oral motor exercises    Potential Considerations  Severity of impairments;Ability to learn/carryover information    Consulted and Agree with Plan of Care  Patient;Other (Comment)   wife   Family Member Consulted  wife Jackelyn Poling       Patient will benefit from skilled therapeutic intervention in order to improve the following deficits and impairments:   Dysarthria and anarthria  Cognitive communication deficit    Problem List Patient Active Problem List   Diagnosis Date Noted  .  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 [place], [place], [address] Phone: [phone]   Fax:  [phone]   Name: RAFEAL SKIBICKI MRN: [id number]  Date of Birth: 07/01/1945

## 2017-11-03 NOTE — Therapy (Signed)
[facility] [address] [place], [place], [address] Phone: [phone]   Fax:  [phone]  Occupational Therapy Treatment  Patient Details  Name: Patrick Ross MRN: [id number] Date of Birth: [birth year] Referring Provider: Dr. Wells Guiles Tat   Encounter Date: 11/03/2017  OT End of Session - 11/03/17 1743    Visit Number  3    Number of Visits  17    Date for OT Re-Evaluation  12/24/17    Authorization Type  Medicare, [employer]    Authorization Time Period  cert. period 10/25/17-01/23/18    Authorization - Visit Number  3    Authorization - Number of Visits  10    OT Start Time  1320    OT Stop Time  1400    OT Time Calculation (min)  40 min    Activity Tolerance  Patient tolerated treatment well    Behavior During Therapy  WFL for tasks assessed/performed;Impulsive       Past Medical History:  Diagnosis Date  . GERD (gastroesophageal reflux disease)   . Hypertension   . Hypothyroidism   . Lumbar stenosis   . OSA (obstructive sleep apnea)   . Parkinson's disease ([place])   . REM behavioral disorder   . Urinary incontinence     Past Surgical History:  Procedure Laterality Date  . BACK SURGERY    . DEEP BRAIN STIMULATOR PLACEMENT     left 12/27/2012, right 12/10/2011, left revision 02/02/2013, replacement bilateral 11/21/2015  . KNEE SURGERY    . NEPHRECTOMY    . SPINAL FUSION    . TONSILLECTOMY      Vitals:   11/03/17 1323  BP: (!) 142/91  Pulse: 88    Subjective Assessment - 11/03/17 1325    Subjective   pt reports BP was up when he was at Dr. Joycelyn Schmid office for Botox    Pertinent History  GERD, HTN, hypothyroidism, lumbar stenosis, OSA, REM behavioral disorder, Urinary incontinence, hx of multiple back surgeries (last 2018 lumbar fusion), s/p bilateral DBS (2013,2014,+revision 2014)     Limitations  fall risk, bilateral deep brain stimulator (DBS), hx of multiple spinal surgeries     Patient  Stated Goals  be able to tie both shoes consistently, be able to go on a hike in October     Currently in Pain?  Yes    Pain Score  3     Pain Location  Back   R hip   Pain Descriptors / Indicators  Aching;Dull    Pain Frequency  Intermittent    Aggravating Factors   standing, standing    Pain Relieving Factors  rest         In supine, closed chain shoulder flex with BUEs holding ball for improved postural alignment and shoulder ROM, followed by chest press with scapular retraction.  Supine>sitting with min cues for use of large amplitude movements and rolling to side with neutral spine to incr ease and decr stress on back to decr pain.  Pt verbalized understanding and returned demo with cueing.  In sitting, stretches in prep for LB dressing:  Cross legs, reach for floor.  Pt needed min cueing for breath control, difficulty with sitting balance for this.    Pt able to tie R shoe with incr ease using step stool today.  Pt reports donning while sitting on top step and using bannister to pull himself up.  Recommended pt not do this on steps due to decr  balance/risk for fall and reliance on pulling with UEs which incr risk of shoulder injury and will cause incr stress/twisting on back.  Recommended pt tie shoes with foot on step stool while sitting down.  Pt/wife verbalized understanding.  Reviewed how current movement patterns create incr stress on spine with decr spatial awareness and discussed neutral spine with activities as able. Also discussed how compensation patterns at spine contribute to forward head, rounded shoulders posture.  Pt continues to need mod  cueing to avoid crossing feet, or hooking feet behind him in sitting to compensate for decr core stability.    Shoulder flex and chest press/scapular retraction with BUEs with ball in sitting with focus on neutral spine/posture with min-mod cueing (tactile) and cueing for breath control.      OT Education - 11/03/17 1739     Education Details  Importance of neutral spine/good posture and movement at hips to decr back pain/prevent future precautions (with bed mobility, sitting, sit>stand, LB dressing); Initial HEP for incr core stability/posture and shoulder ROM--see pt instructions    Person(s) Educated  Patient;Spouse    Methods  Explanation;Demonstration;Verbal cues;Tactile cues;Handout    Comprehension  Verbalized understanding;Returned demonstration;Need further instruction;Tactile cues required;Verbal cues required   min-mod cueing      OT Short Term Goals - 10/25/17 1805      OT SHORT TERM GOAL #1   Title  Pt will be independent with updated PD-specific HEP.--check STGs 11/24/17    Time  4    Period  Weeks    Status  New      OT SHORT TERM GOAL #2   Title  Pt will improve bilateral functional reaching/coordination for ADLs as shown by improving score on box and blocks test by at least 5 blocks bilaterally.    Baseline  R-40, L-44 blocks    Time  8    Period  Weeks    Status  New      OT SHORT TERM GOAL #3   Title  Pt will be able to write address and 2 sentences with good legibility and only min decr in size.    Time  4    Period  Weeks    Status  New      OT SHORT TERM GOAL #4   Title  Pt will be able to tie both shoes mod I.    Time  4    Period  Weeks    Status  New        OT Long Term Goals - 10/25/17 1812      OT LONG TERM GOAL #1   Title  Pt will verbalize understanding of adaptive strategies for ADLs/IADLs to improve ease, movement quality, safety, and prevent future complications.--check LTGs 12/24/17    Time  8    Period  Weeks    Status  New      OT LONG TERM GOAL #2   Title  Pt will improve balance/functional reaching for IADLs as shown by reaching at least 10 inches with each UE without LOB.    Baseline  R-8", L-9" (with steps and close supervision)    Time  8    Period  Weeks    Status  New      OT LONG TERM GOAL #3   Title  Pt will be able to stand to complete  grooming tasks at counter.    Time  8    Status  New      OT  LONG TERM GOAL #4   Title  Pt will improve ability to dress as shown by fastening/unfastening 3 buttons in 46sec or less.    Baseline  56.50sec    Time  8    Period  Weeks    Status  New      OT LONG TERM GOAL #5   Title  Pt will improve dressing as shown by improving time on PPT#4 by at least 5sec with good balance/use of strategies.    Baseline  26.44sec    Time  8    Period  Weeks    Status  New      Long Term Additional Goals   Additional Long Term Goals  Yes      OT LONG TERM GOAL #6   Title  Pt will verbalize understanding of continuing fitness opportunities/appropriate community resources.    Baseline  --    Time  8    Period  Weeks    Status  New            Plan - 11/03/17 1743    Clinical Impression Statement  Pt continues to need cueing for posture and neutral spine during functional tasks to decr back pain/risk for future complication.    Occupational Profile and client history currently impacting functional performance  Pt is independent with BADLs, but needs assist for tying shoes at times.  Pt reports decrease in IADL performance (driving, community activities) and would like to return to hiking.  Pt reports decline, particularly after last back surgeries.      Occupational performance deficits (Please refer to evaluation for details):  ADL's;IADL's;Leisure;Social Participation    Rehab Potential  Good    Current Impairments/barriers affecting progress:  hx of multiple back surgeries, severity of deficits    OT Frequency  2x / week    OT Duration  8 weeks   +eval   OT Treatment/Interventions  Self-care/ADL training;Cryotherapy;Paraffin;Therapeutic exercise;DME and/or AE instruction;Functional Mobility Training;Cognitive remediation/compensation;Balance training;Visual/perceptual remediation/compensation;Manual Therapy;Neuromuscular education;Fluidtherapy;Ultrasound;Moist Heat;Energy  conservation;Passive range of motion;Therapeutic activities;Patient/family education    Plan  posture, proper positioning for ADL tasks, core stability, ? quadraped, coordination/reaching    Clinical Decision Making  Several treatment options, min-mod task modification necessary    Recommended Other Services  current with PT, ST    Consulted and Agree with Plan of Care  Patient;Family member/caregiver    Family Member Consulted  wife       Patient will benefit from skilled therapeutic intervention in order to improve the following deficits and impairments:  Decreased balance, Decreased mobility, Decreased endurance, Difficulty walking, Impaired vision/preception, Pain, Improper body mechanics, Impaired tone, Impaired perceived functional ability, Decreased cognition, Decreased activity tolerance, Decreased coordination, Decreased knowledge of use of DME, Decreased safety awareness, Impaired UE functional use, Improper spinal/pelvic alignment(bradykinesia/hypokinesia)  Visit Diagnosis: Other symptoms and signs involving the nervous system  Abnormal posture  Other symptoms and signs involving the musculoskeletal system  Other lack of coordination  Attention and concentration deficit  Frontal lobe and executive function deficit  Other abnormalities of gait and mobility  Unsteadiness on feet  Muscle weakness (generalized)    Problem List Patient Active Problem List   Diagnosis Date Noted  . 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 [place], [place]  [address] [phone] phone [phone] 11/03/17 6:13 PM

## 2017-11-03 NOTE — Progress Notes (Signed)
Botox Injection for spasticity using needle EMG guidance  Dilution: 50 Units/ml Indication: Severe spasticity which interferes with ADL,mobility and/or  hygiene and is unresponsive to medication management and other conservative care Informed consent was obtained after describing risks and benefits of the procedure with the patient. This includes bleeding, bruising, infection, excessive weakness, or medication side effects. A REMS form is on file and signed. Needle: 30g 1" needle electroden for foot muscles and 25g 2" for leg muscle Number of units per muscle Right flexor hall longus 50 units Right flexor digitorum longus 50 units Right flexor hallucis brevis 50 units Right flexor digitorum brevis 50 units All injections were done after obtaining appropriate EMG activity and after negative drawback for blood. The patient tolerated the procedure well. Post procedure instructions were given. A followup appointment was made.   Pt notes increased low back and buttock pain.  Prior hx of sacroiliac dysfunction.  Has had good relieft from sacroiliac inj under fluoro guidance.  Pt had injections at [facility] in [place] in June. Exam shows Pain inferior to L5 on Right side  Including PSIS and gluteal area Schedule for Right sacroiliac injection under fluoro

## 2017-11-03 NOTE — Patient Instructions (Signed)

## 2017-11-03 NOTE — Patient Instructions (Addendum)
Gaze Stabilization: Sitting    Keeping eyes on target on wall 3 feet away, tilt head down 15-30 and move head side to side for _15-30___ seconds. Repeat while moving head up and down for __15-30__ seconds. Do __1-2__ sessions per day.   Copyright  VHI. All rights reserved.  Gaze Stabilization: Standing Feet Apart    Holding your walker for support:  Feet shoulder width apart, keeping eyes on target on wall __3__ feet away, tilt head down 15-30 and move head side to side for _15-30___ seconds. Repeat while moving head up and down for ___15-30_ seconds. Do __1-2__ sessions per day.   Copyright  VHI. All rights reserved.  Gaze Stabilization: Tip Card  1.Target must remain in focus, not blurry, and appear stationary while head is in motion. 2.Perform exercises with small head movements (45 to either side of midline). 3.Increase speed of head motion so long as target is in focus. 4.If you wear eyeglasses, be sure you can see target through lens (therapist will give specific instructions for bifocal / progressive lenses). 5.These exercises may provoke dizziness or nausea. Work through these symptoms. If too dizzy, slow head movement slightly. Rest between each exercise. 6.Exercises demand concentration; avoid distractions. 7.For safety, perform standing exercises close to a counter, wall, corner, or next to someone.  Copyright  VHI. All rights reserved.

## 2017-11-03 NOTE — Patient Instructions (Addendum)
   1.  Sitting down, hold roll of paper towels, empty shoe box on the sides with both hands (hands open).  Sit up tall (look in mirror) with head forward and feet flat on ground (no crossing).  Raise box up slowly while breathing in and lower slowly while breathing out.  Repeat 10x, 2x/day.  2.  Then bring box to your chest while squeezing shoulder blades together.  Stay up tall with head straight ahead while you push ball out by straightening elbows.

## 2017-11-04 NOTE — Therapy (Signed)
[facility] [address] [place], [place], [address] Phone: [phone]   Fax:  [phone]  Physical Therapy Treatment  Patient Details  Name: Patrick Ross MRN: [id number] Date of Birth: Jul 12, 1945 Referring Provider: Dr. Wells Guiles Tat   Encounter Date: 11/03/2017  PT End of Session - 11/04/17 1713    Visit Number  3    Number of Visits  18    Date for PT Re-Evaluation  01/23/18    Authorization Type  Medicare, Tricare for Life (Will need 10th visit progress notes)    PT Start Time  1405    PT Stop Time  1445    PT Time Calculation (min)  40 min    Activity Tolerance  Patient tolerated treatment well    Behavior During Therapy  [facility] for tasks assessed/performed       Past Medical History:  Diagnosis Date  . GERD (gastroesophageal reflux disease)   . Hypertension   . Hypothyroidism   . Lumbar stenosis   . OSA (obstructive sleep apnea)   . Parkinson's disease ([place])   . REM behavioral disorder   . Urinary incontinence     Past Surgical History:  Procedure Laterality Date  . BACK SURGERY    . DEEP BRAIN STIMULATOR PLACEMENT     left 12/27/2012, right 12/10/2011, left revision 02/02/2013, replacement bilateral 11/21/2015  . KNEE SURGERY    . NEPHRECTOMY    . SPINAL FUSION    . TONSILLECTOMY      There were no vitals filed for this visit.  Subjective Assessment - 11/03/17 1406    Subjective  Had Botox injection in R foot for the dystonia earlier today and will have the SI joint injection in a couple of weeks.    Patient is accompained by:  Family member    Pertinent History  PD diagnosed 2000, R STN DBS 11/2011, L STN DBS 11/2012 with revision 01/2013; lumbar fusion November 22, 2016, back surgeries, HTN, OSA; dystonia (Botox scheduled in September); receives injections in hips    Limitations  Walking;Standing    Patient Stated Goals  Pt's goals for physical therapy are to be able to go on camping trip in  October (walk with walking poles).    Currently in Pain?  Yes    Pain Score  3     Pain Location  Back    Pain Orientation  Lower    Pain Descriptors / Indicators  Aching;Dull    Pain Type  Chronic pain    Pain Onset  More than a month ago    Pain Frequency  Intermittent    Aggravating Factors   standing, moving around    Pain Relieving Factors  rest                       [facility] Adult PT Treatment/Exercise - 11/04/17 0001      Ambulation/Gait   Ambulation/Gait  Yes    Ambulation/Gait Assistance  5: Supervision;4: Min guard    Ambulation/Gait Assistance Details  Cues for widenend BOS with gait, paying attention to R foot being R of midline to avoid scissoring    Ambulation Distance (Feet)  120 Feet   x 2, then 60 ft   Assistive device  --   U-step RW   Gait Pattern  Step-to pattern;Step-through pattern;Decreased step length - right;Decreased step length - left;Decreased dorsiflexion - right;Decreased dorsiflexion - left;Scissoring;Festinating;Narrow base of support    Ambulation Surface  Level;Indoor      Posture/Postural Control   Posture/Postural Control  Postural limitations    Postural Limitations  --   Shoulders hiking with standing at walker   Posture Comments  Tactile and verbal cues for relaxed shoulders during standing and gait.      Vestibular Treatment/Exercise - 11/04/17 0001      Vestibular Treatment/Exercise   Vestibular Treatment Provided  Gaze    Gaze Exercises  X1 Viewing Horizontal;X1 Viewing Vertical      X1 Viewing Horizontal   Foot Position  sitting, then feet apart, feet together, feet semi-tandem   In standing, holding to U-step RW   Reps  5   each direction     X1 Viewing Vertical   Foot Position  sitting, then standing feet apart, feet together, feet semi-tandem   in standing, holding to U-step RW   Reps  5   each direction   Comments  Explained "rules" for x1 viewing:  keeping target in focus, not blurry, maintaining  appropriate speed and turning range, avoiding busy backgrounds, etc.      Pt does not c/o any increase in dizziness symptoms with above exercises   Balance Exercises - 11/04/17 1705      Balance Exercises: Standing   Gait with Head Turns  Forward   Head turns, then body turns focus on visual target w/ gait   Turning  Right;Left;5 reps   Practiced head turn>body and U-step walker turn   Other Standing Exercises  With turning, pt prefers to look at his feet.  When not looking at his feet, his feet tend to cross.     Pt c/o feeling "woozy" with turning, resolved after brief sitting.  Upon sitting, cued patient to look ahead at stationary target until feeling settles.   PT Education - 11/04/17 1712    Education Details  HEP initiated-see instructions    Person(s) Educated  Patient;Spouse    Methods  Explanation    Comprehension  Verbalized understanding;Returned demonstration       PT Short Term Goals - 10/28/17 1642      PT SHORT TERM GOAL #1   Title  Pt will be independent with HEP to address balance, functional strengthening and gait.  TARGET:  11/25/17    Time  5    Period  Weeks    Status  New      PT SHORT TERM GOAL #2   Title  Pt will improve 5x sit<>stand score to less than or equal to 11.5 seconds (with UE support as needed), for improved transfer efficiency and safety.    Time  5    Period  Weeks    Status  New      PT SHORT TERM GOAL #3   Title  Pt will improve TUG score to less than or equal to 18 seconds for decreased fall risk.    Time  5    Period  Weeks    Status  New      PT SHORT TERM GOAL #4   Title  Pt will improve 6 MWT distance by 100 ft, with less than 2 points increase on pain scale for low back pain, for improved gait efficiency and safety.    Baseline  1063 ft in 6 MWT 10/27/17    Time  5    Period  Weeks    Status  New      PT SHORT TERM GOAL #5   Title  Furniture conservator/restorer  to improve by at least 5 points, for decreased fall risk.    Baseline   Berg 25/56 10/27/17    Time  5    Period  Weeks    Status  New        PT Long Term Goals - 10/25/17 1215      PT LONG TERM GOAL #1   Title  Pt/family will verbalize understanding of fall prevention in home environment.  TARGET 12/23/17    Time  9    Period  Weeks    Status  New    Target Date  12/23/17      PT LONG TERM GOAL #2   Title  Pt will improve gait velocity to at least 2.5 ft/sec for improved gait efficiency and safety.    Time  9    Period  Weeks    Status  New    Target Date  12/23/17      PT LONG TERM GOAL #3   Title  Pt will ambulate at least 300 ft, indoor/outdoor surfaces, with bilateral walking poles, with supervision, for pt's goal to negotiate short hike for 72th birthday.    Time  9    Period  Weeks    Status  New    Target Date  12/23/17      PT LONG TERM GOAL #4   Title  Pt will improve TUG cognitive score to less than or equal to 18 seconds for decreased fall risk/improved dual tasking with gait.    Time  5    Period  Weeks    Status  New    Target Date  12/23/17      PT LONG TERM GOAL #5   Title  Pt/family will verbalize techniques to reduce festinating with gait and turns.    Time  5    Period  Weeks    Status  New    Target Date  12/23/17            Plan - 11/04/17 1713    Clinical Impression Statement  HEP initiated today to address visual issues in relation to balance, with seated/standing x1 viewing added to HEP.  Also practiced gait and turns incorporating visual target; however, due to pt's tendency to look down at his feet, he tends to cross over fee and get imbalance when looking ahead (using U-step rollator walker).  Will continue to benefit from skilled PT to address balance, functional strengthening, and gait for improved mobility.    Rehab Potential  Good    Clinical Impairments Affecting Rehab Potential  hx of multiple back surgeries, bilateral DBS, length of time since dx of Parkinson's; pt and wife are motivated for  participation in therapy, pt active in community and exercise    PT Frequency  2x / week    PT Duration  Other (comment)   9 weeks   PT Treatment/Interventions  ADLs/Self Care Home Management;DME Instruction;Balance training;Therapeutic exercise;Therapeutic activities;Functional mobility training;Gait training;Neuromuscular re-education;Patient/family education    PT Next Visit Plan  Review HEP, work on functional strengthening, balance strategies and gait training (did not do this on 11/03/17 visit due to pt just having Botox in R foot for dystonia)    Consulted and Agree with Plan of Care  Patient;Family member/caregiver    Family Member Consulted  wife       Patient will benefit from skilled therapeutic intervention in order to improve the following deficits and impairments:  Abnormal gait, Decreased balance, Decreased mobility,  Decreased strength, Difficulty walking, Postural dysfunction, Decreased safety awareness  Visit Diagnosis: Unsteadiness on feet  Abnormal posture  Other abnormalities of gait and mobility     Problem List Patient Active Problem List   Diagnosis Date Noted  . 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 11/04/2017, 5:19 PM Frazier Butt., PT  [facility] [address] [place], [place], [address] Phone: [phone]   Fax:  [phone]  Name: Patrick Ross MRN: [id number] Date of Birth: [birth year]

## 2017-11-07 ENCOUNTER — Ambulatory Visit: Payer: Medicare Other | Admitting: Physical Therapy

## 2017-11-07 ENCOUNTER — Encounter: Payer: Self-pay | Admitting: Physical Therapy

## 2017-11-07 ENCOUNTER — Ambulatory Visit: Payer: Medicare Other | Admitting: Speech Pathology

## 2017-11-07 DIAGNOSIS — R29818 Other symptoms and signs involving the nervous system: Secondary | ICD-10-CM | POA: Diagnosis not present

## 2017-11-07 DIAGNOSIS — R471 Dysarthria and anarthria: Secondary | ICD-10-CM

## 2017-11-07 DIAGNOSIS — R2681 Unsteadiness on feet: Secondary | ICD-10-CM

## 2017-11-07 DIAGNOSIS — R2689 Other abnormalities of gait and mobility: Secondary | ICD-10-CM

## 2017-11-07 DIAGNOSIS — R293 Abnormal posture: Secondary | ICD-10-CM

## 2017-11-07 NOTE — Therapy (Signed)
[facility] [address] [place], [place], [address] Phone: [phone]   Fax:  [phone]  Speech Language Pathology Treatment  Patient Details  Name: Patrick Ross MRN: [id number] Date of Birth: Dec 15, 1945 Referring Provider: Alonza Bogus   Encounter Date: 11/07/2017  End of Session - 11/07/17 1730    Visit Number  4    Number of Visits  17    Date for SLP Re-Evaluation  01/20/18    SLP Start Time  5277    SLP Stop Time   1530    SLP Time Calculation (min)  43 min    Activity Tolerance  Patient tolerated treatment well       Past Medical History:  Diagnosis Date  . GERD (gastroesophageal reflux disease)   . Hypertension   . Hypothyroidism   . Lumbar stenosis   . OSA (obstructive sleep apnea)   . Parkinson's disease ([place])   . REM behavioral disorder   . Urinary incontinence     Past Surgical History:  Procedure Laterality Date  . BACK SURGERY    . DEEP BRAIN STIMULATOR PLACEMENT     left 12/27/2012, right 12/10/2011, left revision 02/02/2013, replacement bilateral 11/21/2015  . KNEE SURGERY    . NEPHRECTOMY    . SPINAL FUSION    . TONSILLECTOMY      There were no vitals filed for this visit.  Subjective Assessment - 11/07/17 1451    Subjective  Pt reports he noticed talking fast with PT    Patient is accompained by:  Family member    Currently in Pain?  Yes    Pain Score  4     Pain Location  Back    Pain Orientation  Lower    Pain Descriptors / Indicators  Aching;Dull    Pain Type  Chronic pain            ADULT SLP TREATMENT - 11/07/17 1452      General Information   Behavior/Cognition  Alert;Cooperative      Treatment Provided   Treatment provided  Cognitive-Linquistic      Cognitive-Linquistic Treatment   Treatment focused on  Dysarthria    Skilled Treatment  Pt entered with frequent short rushes of speech, degrading intelligibility in initial conversation. SLP worked with pt  on slowing rate in 1-2 sentence responses. Pt typically accelerating after initial 1-2 breath groups, requiring usual mod A verbal cues and occasionally imitation cues to slow rate. As awareness improved, SLP transitioned to noticable visual signal (raising a finger) to remind pt to pause. While he continued to require these cues usually, pt corrected short rushes of speech in response to this cue ~80% of the time. With visual signal pt had good carryover to simple conversation, making second attempts at rushed speech 80% of the time.      Assessment / Recommendations / Plan   Plan  Continue with current plan of care      Progression Toward Goals   Progression toward goals  Progressing toward goals       SLP Education - 11/07/17 1729    Education Details  Wife to try visual signal at home during conversations with friends    Person(s) Educated  Patient;Spouse    Methods  Explanation;Demonstration    Comprehension  Verbalized understanding       SLP Short Term Goals - 11/07/17 1732      SLP SHORT TERM GOAL #1   Title  in sentence  resposnes pt will achieve >90% intelligibility with self correction, over three sessions    Time  2    Period  Weeks    Status  On-going      SLP SHORT TERM GOAL #2   Title  in 5 minutes simple conversation, pt will achieve 90% intelligibility with nonverbal cues    Time  2    Period  Weeks    Status  On-going      SLP SHORT TERM GOAL #3   Title  pt and/or wife will report med administration with puree as more successful than with H2O    Time  2    Period  Weeks    Status  On-going      SLP SHORT TERM GOAL #4   Title  pt and/or wife will tell SLP of 3 overt s/s aspiration PNA    Time  2    Period  Weeks    Status  On-going       SLP Long Term Goals - 11/07/17 1732      SLP LONG TERM GOAL #1   Title  pt will achieve 90% intelligibility in 10 minutes simple conversation with nonverbal cues, for three sessions    Time  6    Period  Weeks     Status  On-going      SLP LONG TERM GOAL #2   Title  in 5 minutes simple conversation pt will demo self correction for fast rate/unintelligible speech, to improve intelligiblity to 90%, over three sessions    Time  6    Period  Weeks    Status  On-going       Plan - 11/07/17 1730    Clinical Impression Statement  Pt presents today with dysarthria, including unintelligible speech 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 Vocal intensity was WNL throughout session today. In 1-2 sentence tasks pt intelligibility was >95% with usual visual cue. Usual visual cue required for carryover to conversation between structured tasks. Pt would benefit from skilled ST focusing on pt's goal of improving commnicative ability with family and in the community.    Speech Therapy Frequency  2x / week    Treatment/Interventions  Aspiration precaution training;SLP instruction and feedback;Compensatory strategies;Patient/family education;Internal/external aids;Multimodal communcation approach;Functional tasks;Cognitive reorganization;Oral motor exercises    Potential to Achieve Goals  Fair    Potential Considerations  Severity of impairments;Ability to learn/carryover information    Consulted and Agree with Plan of Care  Patient;Other (Comment)    Family Member Consulted  wife Jackelyn Poling       Patient will benefit from skilled therapeutic intervention in order to improve the following deficits and impairments:   Dysarthria and anarthria    Problem List Patient Active Problem List   Diagnosis Date Noted  . 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 [place], [place], [address] Phone: [phone]   Fax:  [phone]   Name: Patrick Ross MRN: [id number] Date of Birth: September 10, 1945

## 2017-11-08 MED ORDER — CARBIDOPA-LEVODOPA 25-100 MG PO TABS: 1.0000 | ORAL_TABLET | Freq: Four times a day (QID) | ORAL | 1 refills | Status: DC

## 2017-11-08 NOTE — Therapy (Signed)
[facility] [address] [place], [place], [address] Phone: [phone]   Fax:  [phone]  Physical Therapy Treatment  Patient Details  Name: Patrick Ross MRN: [id number] Date of Birth: [birth year] Referring Provider: Dr. Wells Guiles Tat   Encounter Date: 11/07/2017  PT End of Session - 11/08/17 1311    Visit Number  4    Number of Visits  18    Date for PT Re-Evaluation  01/23/18    Authorization Type  Medicare, Tricare for Life (Will need 10th visit progress notes)    PT Start Time  1404    PT Stop Time  1447    PT Time Calculation (min)  43 min    Activity Tolerance  Patient tolerated treatment well   slight increase in pain at end of session   Behavior During Therapy  [facility] for tasks assessed/performed       Past Medical History:  Diagnosis Date  . GERD (gastroesophageal reflux disease)   . Hypertension   . Hypothyroidism   . Lumbar stenosis   . OSA (obstructive sleep apnea)   . Parkinson's disease ([place])   . REM behavioral disorder   . Urinary incontinence     Past Surgical History:  Procedure Laterality Date  . BACK SURGERY    . DEEP BRAIN STIMULATOR PLACEMENT     left 12/27/2012, right 12/10/2011, left revision 02/02/2013, replacement bilateral 11/21/2015  . KNEE SURGERY    . NEPHRECTOMY    . SPINAL FUSION    . TONSILLECTOMY      There were no vitals filed for this visit.  Subjective Assessment - 11/07/17 1406    Subjective  No changes from the last visit.  Trying to see if I can get SI joint injections both hips at the same time (on the 20th of this month)    Patient is accompained by:  Family member    Pertinent History  PD diagnosed 2000, R STN DBS 11/2011, L STN DBS 11/2012 with revision 01/2013; lumbar fusion November 22, 2016, back surgeries, HTN, OSA; dystonia (Botox scheduled in September); receives injections in hips    Limitations  Walking;Standing    Patient Stated Goals  Pt's goals for  physical therapy are to be able to go on camping trip in October (walk with walking poles).    Currently in Pain?  Yes    Pain Score  4     Pain Location  Back    Pain Orientation  Lower    Pain Descriptors / Indicators  Aching;Dull    Pain Type  Chronic pain    Pain Onset  More than a month ago    Pain Frequency  Intermittent    Aggravating Factors   standing, moving around, sitting the wrong way    Pain Relieving Factors  rest                       [facility] Adult PT Treatment/Exercise - 11/08/17 0001      Ambulation/Gait   Ambulation/Gait  Yes    Ambulation/Gait Assistance  5: Supervision;4: Min guard    Ambulation/Gait Assistance Details  Cues for widened BOS, with attention to R foot staying to right of midline.  When patient takes large steps, longer strides, RLE noted to have less scissoring.  Practiced turns, emphasis on smooth turns to avoid crossing feet with turns    Ambulation Distance (Feet)  120 Feet   x 2, 50 ft  x 4 reps   Assistive device  --   U-step RW   Gait Pattern  Step-to pattern;Step-through pattern;Decreased step length - right;Decreased step length - left;Decreased dorsiflexion - right;Decreased dorsiflexion - left;Scissoring;Festinating;Narrow base of support    Ambulation Surface  Level;Indoor    Pre-Gait Activities  With focused attention to turning with gait, pt noted to have smoother, slower, safer turns.  Discussed specific focus on gait only, with avoiding dual tasking with gait and turns to improve overall safety, stability with gait.    Gait Comments  Ended gait training with additional 40 ft x 4 reps with visual cues of mirrro-using white tiles/blue tiles as visual cues for widened BOS.      High Level Balance   High Level Balance Comments  Lateral weightshifting at counter x 10 reps each side (c/o increased pain in back); stagger stance forward/back weightshfiting x 10 reps each leg, (no c/o pain), educated as way to weightshift in standing  to less pain.      Exercises   Exercises  Knee/Hip      Knee/Hip Exercises: Standing   Forward Step Up  Right;Left;2 sets;10 reps;Hand Hold: 2;Step Height: 6"   Step up/up-down/down; then single leg step up     Knee/Hip Exercises: Seated   Sit to Sand  1 set;5 reps;with UE support   from mat surface, for functional strengthening     Vestibular Treatment/Exercise - 11/08/17 0001      Vestibular Treatment/Exercise   Gaze Exercises  X1 Viewing Horizontal;X1 Viewing Vertical;Eye/Head Exercise Horizontal      X1 Viewing Horizontal   Foot Position  sitting, then feet apart, feet together, feet semi-tandem   standing-holding to U-step RW   Reps  5      X1 Viewing Vertical   Foot Position  sitting, then standing feet apart   In standing- at U-step walker   Reps  5      Eye/Head Exercise Horizontal   Foot Position  feet apart    Reps  5   2 sets   Comments  Reports more "blurriness" on L side            PT Education - 11/08/17 1309    Education Details  Avoiding dual tasking with gait (to improve safety with gait and turns)    Person(s) Educated  Patient    Methods  Explanation;Demonstration    Comprehension  Verbalized understanding       PT Short Term Goals - 10/28/17 1642      PT SHORT TERM GOAL #1   Title  Pt will be independent with HEP to address balance, functional strengthening and gait.  TARGET:  11/25/17    Time  5    Period  Weeks    Status  New      PT SHORT TERM GOAL #2   Title  Pt will improve 5x sit<>stand score to less than or equal to 11.5 seconds (with UE support as needed), for improved transfer efficiency and safety.    Time  5    Period  Weeks    Status  New      PT SHORT TERM GOAL #3   Title  Pt will improve TUG score to less than or equal to 18 seconds for decreased fall risk.    Time  5    Period  Weeks    Status  New      PT SHORT TERM GOAL #4   Title  Pt will  improve 6 MWT distance by 100 ft, with less than 2 points increase on  pain scale for low back pain, for improved gait efficiency and safety.    Baseline  1063 ft in 6 MWT 10/27/17    Time  5    Period  Weeks    Status  New      PT SHORT TERM GOAL #5   Title  Berg Balance test to improve by at least 5 points, for decreased fall risk.    Baseline  Berg 25/56 10/27/17    Time  5    Period  Weeks    Status  New        PT Long Term Goals - 10/25/17 1215      PT LONG TERM GOAL #1   Title  Pt/family will verbalize understanding of fall prevention in home environment.  TARGET 12/23/17    Time  9    Period  Weeks    Status  New    Target Date  12/23/17      PT LONG TERM GOAL #2   Title  Pt will improve gait velocity to at least 2.5 ft/sec for improved gait efficiency and safety.    Time  9    Period  Weeks    Status  New    Target Date  12/23/17      PT LONG TERM GOAL #3   Title  Pt will ambulate at least 300 ft, indoor/outdoor surfaces, with bilateral walking poles, with supervision, for pt's goal to negotiate short hike for 72th birthday.    Time  9    Period  Weeks    Status  New    Target Date  12/23/17      PT LONG TERM GOAL #4   Title  Pt will improve TUG cognitive score to less than or equal to 18 seconds for decreased fall risk/improved dual tasking with gait.    Time  5    Period  Weeks    Status  New    Target Date  12/23/17      PT LONG TERM GOAL #5   Title  Pt/family will verbalize techniques to reduce festinating with gait and turns.    Time  5    Period  Weeks    Status  New    Target Date  12/23/17            Plan - 11/08/17 1312    Clinical Impression Statement  Skilled PT session today focused on review of HEP for gaze stabilization, gait training for widened BOS and improved turns, and functional strengthening.  Pt does well with visual cues for widened BOS and decreasing scissoring of RLE, and overall seems to have improved attention to his widened BOS.  Pt will continue to benefit from skilled PT to address balance  and gait training as well as functional lower extremity strength.    Rehab Potential  Good    Clinical Impairments Affecting Rehab Potential  hx of multiple back surgeries, bilateral DBS, length of time since dx of Parkinson's; pt and wife are motivated for participation in therapy, pt active in community and exercise    PT Frequency  2x / week    PT Duration  Other (comment)   9 weeks   PT Treatment/Interventions  ADLs/Self Care Home Management;DME Instruction;Balance training;Therapeutic exercise;Therapeutic activities;Functional mobility training;Gait training;Neuromuscular re-education;Patient/family education    PT Next Visit Plan  Continue to  work on functional strengthening, balance  strategies and gait training -add to HEP for balance    Consulted and Agree with Plan of Care  Patient;Family member/caregiver    Family Member Consulted  wife       Patient will benefit from skilled therapeutic intervention in order to improve the following deficits and impairments:  Abnormal gait, Decreased balance, Decreased mobility, Decreased strength, Difficulty walking, Postural dysfunction, Decreased safety awareness  Visit Diagnosis: Other abnormalities of gait and mobility  Unsteadiness on feet  Abnormal posture     Problem List Patient Active Problem List   Diagnosis Date Noted  . 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 11/08/2017, 1:15 PM  Frazier Butt., PT   [facility] [address] [place], [place], [address] Phone: [phone]   Fax:  [phone]  Name: Patrick Ross MRN: [id number] Date of Birth: 1945-11-23

## 2017-11-09 ENCOUNTER — Encounter: Payer: Self-pay | Admitting: Occupational Therapy

## 2017-11-09 ENCOUNTER — Ambulatory Visit: Payer: Medicare Other | Admitting: Occupational Therapy

## 2017-11-09 ENCOUNTER — Ambulatory Visit: Payer: Medicare Other

## 2017-11-09 VITALS — BP 140/92

## 2017-11-09 DIAGNOSIS — R41841 Cognitive communication deficit: Secondary | ICD-10-CM

## 2017-11-09 DIAGNOSIS — R29818 Other symptoms and signs involving the nervous system: Secondary | ICD-10-CM

## 2017-11-09 DIAGNOSIS — R471 Dysarthria and anarthria: Secondary | ICD-10-CM

## 2017-11-09 DIAGNOSIS — R41840 Attention and concentration deficit: Secondary | ICD-10-CM

## 2017-11-09 DIAGNOSIS — R278 Other lack of coordination: Secondary | ICD-10-CM

## 2017-11-09 DIAGNOSIS — R29898 Other symptoms and signs involving the musculoskeletal system: Secondary | ICD-10-CM

## 2017-11-09 NOTE — Therapy (Signed)
[facility] [address] [place], [place], [address] Phone: [phone]   Fax:  [phone]  Occupational Therapy Treatment  Patient Details  Name: Patrick Ross MRN: [id number] Date of Birth: 12-26-1945 Referring Provider: Dr. Wells Guiles Tat   Encounter Date: 11/09/2017  OT End of Session - 11/09/17 1309    Visit Number  4    Number of Visits  17    Date for OT Re-Evaluation  12/24/17    Authorization Type  Medicare, Tricare for Life    Authorization - Visit Number  4    Authorization - Number of Visits  10    OT Start Time  1105    OT Stop Time  1145    OT Time Calculation (min)  40 min    Activity Tolerance  Patient tolerated treatment well    Behavior During Therapy  [facility] for tasks assessed/performed       Past Medical History:  Diagnosis Date  . GERD (gastroesophageal reflux disease)   . Hypertension   . Hypothyroidism   . Lumbar stenosis   . OSA (obstructive sleep apnea)   . Parkinson's disease ([place])   . REM behavioral disorder   . Urinary incontinence     Past Surgical History:  Procedure Laterality Date  . BACK SURGERY    . DEEP BRAIN STIMULATOR PLACEMENT     left 12/27/2012, right 12/10/2011, left revision 02/02/2013, replacement bilateral 11/21/2015  . KNEE SURGERY    . NEPHRECTOMY    . SPINAL FUSION    . TONSILLECTOMY      Vitals:   11/09/17 1115  BP: (!) 140/92    Subjective Assessment - 11/09/17 1115    Pertinent History  GERD, HTN, hypothyroidism, lumbar stenosis, OSA, REM behavioral disorder, Urinary incontinence, hx of multiple back surgeries (last 2018 lumbar fusion), s/p bilateral DBS (2778,2423,+NTIRWERX 2014)     Patient Stated Goals  be able to tie both shoes consistently, be able to go on a hike in October     Currently in Pain?  No/denies    Pain Score  5     Pain Location  Back                Treatment: Supine, closed chain shoulder flexion and chest press, min  v.c for keeping shoulders down and extending elbows. Modified quadraped at table, small pushup,(pt did not stand fully up to [facility]! Up as it hurt his back. and PWR! rock           OT Education - 11/09/17 1142    Education Details  Handwriting strategies, handout provided, therapist reviewed strategies with pt/ wife and pt returned demonstration following incstruction/ v.c    Person(s) Educated  Patient;Spouse    Methods  Explanation;Demonstration;Verbal cues;Handout    Comprehension  Verbalized understanding;Returned demonstration;Verbal cues required       OT Short Term Goals - 10/25/17 1805      OT SHORT TERM GOAL #1   Title  Pt will be independent with updated PD-specific HEP.--check STGs 11/24/17    Time  4    Period  Weeks    Status  New      OT SHORT TERM GOAL #2   Title  Pt will improve bilateral functional reaching/coordination for ADLs as shown by improving score on box and blocks test by at least 5 blocks bilaterally.    Baseline  R-40, L-44 blocks    Time  8    Period  Weeks    Status  New      OT SHORT TERM GOAL #3   Title  Pt will be able to write address and 2 sentences with good legibility and only min decr in size.    Time  4    Period  Weeks    Status  New      OT SHORT TERM GOAL #4   Title  Pt will be able to tie both shoes mod I.    Time  4    Period  Weeks    Status  New        OT Long Term Goals - 10/25/17 1812      OT LONG TERM GOAL #1   Title  Pt will verbalize understanding of adaptive strategies for ADLs/IADLs to improve ease, movement quality, safety, and prevent future complications.--check LTGs 12/24/17    Time  8    Period  Weeks    Status  New      OT LONG TERM GOAL #2   Title  Pt will improve balance/functional reaching for IADLs as shown by reaching at least 10 inches with each UE without LOB.    Baseline  R-8", L-9" (with steps and close supervision)    Time  8    Period  Weeks    Status  New      OT LONG TERM GOAL #3    Title  Pt will be able to stand to complete grooming tasks at counter.    Time  8    Status  New      OT LONG TERM GOAL #4   Title  Pt will improve ability to dress as shown by fastening/unfastening 3 buttons in 46sec or less.    Baseline  56.50sec    Time  8    Period  Weeks    Status  New      OT LONG TERM GOAL #5   Title  Pt will improve dressing as shown by improving time on PPT#4 by at least 5sec with good balance/use of strategies.    Baseline  26.44sec    Time  8    Period  Weeks    Status  New      Long Term Additional Goals   Additional Long Term Goals  Yes      OT LONG TERM GOAL #6   Title  Pt will verbalize understanding of continuing fitness opportunities/appropriate community resources.    Baseline  --    Time  8    Period  Weeks    Status  New            Plan - 11/09/17 1310    Clinical Impression Statement  Pt is progressing towards goals. He demonstrates improved handwriting with use of strategies.    Occupational Profile and client history currently impacting functional performance  Pt is independent with BADLs, but needs assist for tying shoes at times.  Pt reports decrease in IADL performance (driving, community activities) and would like to return to hiking.  Pt reports decline, particularly after last back surgeries.      Rehab Potential  Good    Current Impairments/barriers affecting progress:  hx of multiple back surgeries, severity of deficits    OT Frequency  2x / week    OT Duration  8 weeks    OT Treatment/Interventions  Self-care/ADL training;Cryotherapy;Paraffin;Therapeutic exercise;DME and/or AE instruction;Functional Mobility Training;Cognitive remediation/compensation;Balance training;Visual/perceptual remediation/compensation;Manual Therapy;Neuromuscular education;Fluidtherapy;Ultrasound;Moist Heat;Energy conservation;Passive range of motion;Therapeutic  activities;Patient/family education    Plan  posture, proper positioning for ADL tasks,  core stability, ? quadraped, coordination/reaching    OT Home Exercise Plan  educaated regarding handwriting strategies    Consulted and Agree with Plan of Care  Patient    Family Member Consulted  wife       Patient will benefit from skilled therapeutic intervention in order to improve the following deficits and impairments:  Decreased balance, Decreased mobility, Decreased endurance, Difficulty walking, Impaired vision/preception, Pain, Improper body mechanics, Impaired tone, Impaired perceived functional ability, Decreased cognition, Decreased activity tolerance, Decreased coordination, Decreased knowledge of use of DME, Decreased safety awareness, Impaired UE functional use, Improper spinal/pelvic alignment  Visit Diagnosis: Other symptoms and signs involving the nervous system  Other symptoms and signs involving the musculoskeletal system  Other lack of coordination  Attention and concentration deficit    Problem List Patient Active Problem List   Diagnosis Date Noted  . 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 [place], [place], [address] Phone: [phone]   Fax:  [phone]  Name: Patrick Ross MRN: [id number] Date of Birth: 1946/02/05

## 2017-11-09 NOTE — Therapy (Signed)
[facility] [address] [place], [place], [address] Phone: [phone]   Fax:  [phone]  Speech Language Pathology Treatment  Patient Details  Name: Patrick Ross MRN: [id number] Date of Birth: 1946/01/05 Referring Provider: Alonza Bogus   Encounter Date: 11/09/2017  End of Session - 11/09/17 1315    Visit Number  5    Number of Visits  17    Date for SLP Re-Evaluation  01/20/18    SLP Start Time  1147    SLP Stop Time   1230    SLP Time Calculation (min)  43 min    Activity Tolerance  Patient tolerated treatment well       Past Medical History:  Diagnosis Date  . GERD (gastroesophageal reflux disease)   . Hypertension   . Hypothyroidism   . Lumbar stenosis   . OSA (obstructive sleep apnea)   . Parkinson's disease ([place])   . REM behavioral disorder   . Urinary incontinence     Past Surgical History:  Procedure Laterality Date  . BACK SURGERY    . DEEP BRAIN STIMULATOR PLACEMENT     left 12/27/2012, right 12/10/2011, left revision 02/02/2013, replacement bilateral 11/21/2015  . KNEE SURGERY    . NEPHRECTOMY    . SPINAL FUSION    . TONSILLECTOMY      There were no vitals filed for this visit.  Subjective Assessment - 11/09/17 1148    Subjective  "I have good days and bad days with my speech. Sometimes when I pay attention I talk normally." (at [facility] pace)    Patient is accompained by:  Family member   wife Patrick Ross   Currently in Pain?  Yes    Pain Score  5     Pain Location  Back    Pain Orientation  Lower    Pain Descriptors / Indicators  Aching    Pain Type  Chronic pain    Pain Radiating Towards  "stems from SI joint"    Pain Onset  More than a month ago    Pain Frequency  Intermittent            ADULT SLP TREATMENT - 11/09/17 1150      General Information   Behavior/Cognition  Alert;Cooperative;Pleasant mood      Treatment Provided   Treatment provided  Cognitive-Linquistic       Cognitive-Linquistic Treatment   Treatment focused on  Dysarthria    Skilled Treatment  Pt began session with slowed rate of speech and maintained this slow rate for approx 3 conversational turns prior to accelerating his speech rate. SLP worked with pt in reading sentences in a higher-interest book, with 90% success with slowed rate. SLP incr'd difficulty and had pt give descriptions of pictures for wife to guess object. Pt remained with slowed rate 85% of the time with rare min nonverbal and verbal cues. Pt carried-over slowed rate to conversation intermittently during the session.       Assessment / Recommendations / Plan   Plan  Continue with current plan of care      Progression Toward Goals   Progression toward goals  Progressing toward goals       SLP Education - 11/09/17 1315    Education Details  slowed rate for speech - how to make it habitual    Person(s) Educated  Patient;Spouse    Methods  Explanation    Comprehension  Verbalized understanding  SLP Short Term Goals - 11/09/17 1317      SLP SHORT TERM GOAL #1   Title  in sentence resposnes pt will achieve >90% intelligibility with self correction, over three sessions    Baseline  11-09-17    Time  2    Period  Weeks    Status  On-going      SLP SHORT TERM GOAL #2   Title  in 5 minutes simple conversation, pt will achieve 90% intelligibility with nonverbal cues    Time  2    Period  Weeks    Status  On-going      SLP SHORT TERM GOAL #3   Title  pt and/or wife will report med administration with puree as more successful than with H2O    Time  2    Period  Weeks    Status  On-going      SLP SHORT TERM GOAL #4   Title  pt and/or wife will tell SLP of 3 overt s/s aspiration PNA    Time  2    Period  Weeks    Status  On-going       SLP Long Term Goals - 11/09/17 1318      SLP LONG TERM GOAL #1   Title  pt will achieve 90% intelligibility in 10 minutes simple conversation with nonverbal cues, for three  sessions    Time  6    Period  Weeks    Status  On-going      SLP LONG TERM GOAL #2   Title  in 5 minutes simple conversation pt will demo self correction for fast rate/unintelligible speech, to improve intelligiblity to 90%, over three sessions    Time  6    Period  Weeks    Status  On-going       Plan - 11/09/17 1315    Clinical Impression Statement  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 Pt was carrying over therapeutic behavior of slowed rate of speech to spontaneous conversation rarely during the session. Pt very good success at spontaneous sentence level. Pt would benefit from skilled ST focusing on pt's goal of improving commnicative ability with family and in the community.    Speech Therapy Frequency  2x / week    Duration  --   8 weeks/ 17 sessions   Treatment/Interventions  Aspiration precaution training;SLP instruction and feedback;Compensatory strategies;Patient/family education;Internal/external aids;Multimodal communcation approach;Functional tasks;Cognitive reorganization;Oral motor exercises    Potential to Achieve Goals  Fair    Potential Considerations  Severity of impairments;Ability to learn/carryover information    Consulted and Agree with Plan of Care  Patient;Other (Comment)    Family Member Consulted  wife Patrick Ross       Patient will benefit from skilled therapeutic intervention in order to improve the following deficits and impairments:   Dysarthria and anarthria  Cognitive communication deficit    Problem List Patient Active Problem List   Diagnosis Date Noted  . 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 [place], [place], [address] Phone: [phone]   Fax:  [phone]   Name: Patrick Ross MRN: [id number] Date of Birth: 1946/01/02

## 2017-11-09 NOTE — Patient Instructions (Signed)
  Please complete the assigned speech therapy homework prior to your next session and return it to the speech therapist at your next visit.  

## 2017-11-10 ENCOUNTER — Ambulatory Visit: Payer: Medicare Other | Admitting: Physical Therapy

## 2017-11-10 ENCOUNTER — Encounter: Payer: Self-pay | Admitting: Physical Therapy

## 2017-11-10 DIAGNOSIS — R29818 Other symptoms and signs involving the nervous system: Secondary | ICD-10-CM | POA: Diagnosis not present

## 2017-11-10 DIAGNOSIS — R293 Abnormal posture: Secondary | ICD-10-CM

## 2017-11-10 DIAGNOSIS — R2681 Unsteadiness on feet: Secondary | ICD-10-CM

## 2017-11-11 ENCOUNTER — Ambulatory Visit: Payer: Medicare Other | Admitting: *Deleted

## 2017-11-11 ENCOUNTER — Encounter: Payer: Self-pay | Admitting: Physical Therapy

## 2017-11-11 NOTE — Therapy (Signed)
[facility] [address] [place], [place], [address] Phone: [phone]   Fax:  [phone]  Physical Therapy Treatment  Patient Details  Name: Patrick Ross MRN: [id number] Date of Birth: 10-16-1945 Referring Provider: Dr. Wells Guiles Tat   Encounter Date: 11/10/2017  PT End of Session - 11/10/17 1026    Visit Number  5    Number of Visits  18    Date for PT Re-Evaluation  01/23/18    Authorization Type  Medicare, Tricare for Life (Will need 10th visit progress notes)    PT Start Time  1020    PT Stop Time  1100    PT Time Calculation (min)  40 min    Activity Tolerance  Patient limited by pain   takes frequent breaks due to increased pain   Behavior During Therapy  [facility] for tasks assessed/performed       Past Medical History:  Diagnosis Date  . GERD (gastroesophageal reflux disease)   . Hypertension   . Hypothyroidism   . Lumbar stenosis   . OSA (obstructive sleep apnea)   . Parkinson's disease ([place])   . REM behavioral disorder   . Urinary incontinence     Past Surgical History:  Procedure Laterality Date  . BACK SURGERY    . DEEP BRAIN STIMULATOR PLACEMENT     left 12/27/2012, right 12/10/2011, left revision 02/02/2013, replacement bilateral 11/21/2015  . KNEE SURGERY    . NEPHRECTOMY    . SPINAL FUSION    . TONSILLECTOMY      There were no vitals filed for this visit.  Subjective Assessment - 11/10/17 1023    Subjective  Back and SI joint really bothering me today.    Patient is accompained by:  Family member    Pertinent History  PD diagnosed 2000, R STN DBS 11/2011, L STN DBS 11/2012 with revision 01/2013; lumbar fusion November 22, 2016, back surgeries, HTN, OSA; dystonia (Botox scheduled in September); receives injections in hips    Limitations  Walking;Standing    Patient Stated Goals  Pt's goals for physical therapy are to be able to go on camping trip in October (walk with walking poles).     Currently in Pain?  Yes    Pain Score  6     Pain Location  Back    Pain Orientation  Lower    Pain Descriptors / Indicators  Aching    Pain Type  Chronic pain    Pain Onset  More than a month ago    Pain Frequency  Intermittent    Aggravating Factors   woke up hurting    Pain Relieving Factors  time,  some of the knee to chest exercises help somewhat                       [facility] Adult PT Treatment/Exercise - 11/11/17 0001      Ambulation/Gait   Ambulation/Gait  Yes    Ambulation/Gait Assistance  5: Supervision    Ambulation Distance (Feet)  120 Feet   x 2   Assistive device  --   U-step RW   Gait Pattern  Step-through pattern;Decreased step length - right;Decreased step length - left;Decreased dorsiflexion - right;Decreased dorsiflexion - left;Scissoring;Festinating;Narrow base of support   Improved ability to keep RLE to the right of midline   Ambulation Surface  Level;Indoor      Exercises   Exercises  Lumbar  Lumbar Exercises: Stretches   Single Knee to Chest Stretch  Right;Left;5 reps;30 seconds    Lower Trunk Rotation  5 reps;10 seconds    Lower Trunk Rotation Limitations  Started with trunk rotation rocking for improved relaxation      Lumbar Exercises: Seated   Other Seated Lumbar Exercises  Seated anterior/posterior pelvic tilts, 2 sets x 5 reps, with facilitation;  pt needs verbal/tactile cues to bring shoulders in line with hips (to avoid sitting in posterior position with shoulders behind hips, supported with hands posteriorly)    Other Seated Lumbar Exercises  Seated lumbar/core strengthening with upright posture-cues for abdominal activation and UE lifts-pt fatigues after 3 UE lifts and reverts into more posterior, slumped posture.      Lumbar Exercises: Supine   Pelvic Tilt  10 reps    Pelvic Tilt Limitations  Limited pelvic mobility; PT provides facilitation          Balance Exercises - 11/11/17 0839      Balance Exercises: Standing    Other Standing Exercises  At counter, attempted modified quadruped PWR! MOves including PWR! Rock (anterior/posterior direction), x 5-10 reps with pt' c/o increased pain.  Attempted lateral weightshifting, and attempted stagger stance anterior/posterior weightshifting to begin posterior balance recovery work; however, pt needs to sit between these exercises due to increased pain.        PT Education - 11/11/17 0848    Education Details  Importance of pt performing his back exercises (from previous bout of PT at another clinic) at home to help with flexibility and pain management-pt admits he is not doing regularly; PT recommneds at least [facility] and trunk rotation, 2-3 times per day    Person(s) Educated  Patient;Spouse    Methods  Explanation;Demonstration;Verbal cues    Comprehension  Verbalized understanding;Returned demonstration;Verbal cues required       PT Short Term Goals - 10/28/17 1642      PT SHORT TERM GOAL #1   Title  Pt will be independent with HEP to address balance, functional strengthening and gait.  TARGET:  11/25/17    Time  5    Period  Weeks    Status  New      PT SHORT TERM GOAL #2   Title  Pt will improve 5x sit<>stand score to less than or equal to 11.5 seconds (with UE support as needed), for improved transfer efficiency and safety.    Time  5    Period  Weeks    Status  New      PT SHORT TERM GOAL #3   Title  Pt will improve TUG score to less than or equal to 18 seconds for decreased fall risk.    Time  5    Period  Weeks    Status  New      PT SHORT TERM GOAL #4   Title  Pt will improve 6 MWT distance by 100 ft, with less than 2 points increase on pain scale for low back pain, for improved gait efficiency and safety.    Baseline  1063 ft in 6 MWT 10/27/17    Time  5    Period  Weeks    Status  New      PT SHORT TERM GOAL #5   Title  Berg Balance test to improve by at least 5 points, for decreased fall risk.    Baseline  Berg 25/56 10/27/17    Time  5     Period  Weeks    Status  New        PT Long Term Goals - 10/25/17 1215      PT LONG TERM GOAL #1   Title  Pt/family will verbalize understanding of fall prevention in home environment.  TARGET 12/23/17    Time  9    Period  Weeks    Status  New    Target Date  12/23/17      PT LONG TERM GOAL #2   Title  Pt will improve gait velocity to at least 2.5 ft/sec for improved gait efficiency and safety.    Time  9    Period  Weeks    Status  New    Target Date  12/23/17      PT LONG TERM GOAL #3   Title  Pt will ambulate at least 300 ft, indoor/outdoor surfaces, with bilateral walking poles, with supervision, for pt's goal to negotiate short hike for 72th birthday.    Time  9    Period  Weeks    Status  New    Target Date  12/23/17      PT LONG TERM GOAL #4   Title  Pt will improve TUG cognitive score to less than or equal to 18 seconds for decreased fall risk/improved dual tasking with gait.    Time  5    Period  Weeks    Status  New    Target Date  12/23/17      PT LONG TERM GOAL #5   Title  Pt/family will verbalize techniques to reduce festinating with gait and turns.    Time  5    Period  Weeks    Status  New    Target Date  12/23/17            Plan - 11/11/17 0850    Clinical Impression Statement  Today's skilled PT session attempted to focus more on standing balance, with work in posterior direction; however, pt is significantly limited in standing and walking today due to increased back, SI joint pain.  Looked at some of his low back exercises (from previous bout of PT at another clinic), with pt admitting he is not doing them at home.  PT recommends strongly that he needs to be doing these low back flexibility exercises AT HOME for flexibility and pain management so these therapy sessions can focus on balance and gait training.  Pt noted to have significant trunk and core weakness, with difficulty maintaining upright posture with UE activities, before he reverts  into more posterior posture position supported by UEs.  Pt will benefit from skilled PT to address balance and gait, but is limited by pain (to have SI joint injections next week).      Rehab Potential  Good    Clinical Impairments Affecting Rehab Potential  hx of multiple back surgeries, bilateral DBS, length of time since dx of Parkinson's; pt and wife are motivated for participation in therapy, pt active in community and exercise    PT Frequency  2x / week    PT Duration  Other (comment)   9 weeks   PT Treatment/Interventions  ADLs/Self Care Home Management;DME Instruction;Balance training;Therapeutic exercise;Therapeutic activities;Functional mobility training;Gait training;Neuromuscular re-education;Patient/family education    PT Next Visit Plan  Check on how patient is doing with back exercises at home; work on core stability, balance, and gait training (if pain persists as it did today limiting standing-may need to hold until after SI  joint injections on 9/20)    Consulted and Agree with Plan of Care  Patient;Family member/caregiver    Family Member Consulted  wife       Patient will benefit from skilled therapeutic intervention in order to improve the following deficits and impairments:  Abnormal gait, Decreased balance, Decreased mobility, Decreased strength, Difficulty walking, Postural dysfunction, Decreased safety awareness  Visit Diagnosis: Abnormal posture  Unsteadiness on feet     Problem List Patient Active Problem List   Diagnosis Date Noted  . 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 11/11/2017, 8:56 AM Frazier Butt., PT  [facility] [address] [place], [place], [address] Phone: [phone]   Fax:  [phone]  Name: HULBERT BRANSCOME MRN: [id number] Date of Birth: [birth year]

## 2017-11-14 ENCOUNTER — Encounter: Payer: Self-pay | Admitting: Physical Therapy

## 2017-11-14 ENCOUNTER — Ambulatory Visit: Payer: Medicare Other | Admitting: Speech Pathology

## 2017-11-14 ENCOUNTER — Ambulatory Visit: Payer: Medicare Other | Admitting: Physical Therapy

## 2017-11-14 DIAGNOSIS — R29818 Other symptoms and signs involving the nervous system: Secondary | ICD-10-CM | POA: Diagnosis not present

## 2017-11-14 DIAGNOSIS — R2681 Unsteadiness on feet: Secondary | ICD-10-CM

## 2017-11-14 DIAGNOSIS — R293 Abnormal posture: Secondary | ICD-10-CM

## 2017-11-14 DIAGNOSIS — R471 Dysarthria and anarthria: Secondary | ICD-10-CM

## 2017-11-14 NOTE — Therapy (Signed)
[facility] [address] [place], [place], [address] Phone: [phone]   Fax:  [phone]  Speech Language Pathology Treatment  Patient Details  Name: Patrick Ross MRN: [id number] Date of Birth: 02-27-1946 Referring Provider: Alonza Bogus   Encounter Date: 11/14/2017  End of Session - 11/14/17 1724    Visit Number  6    Number of Visits  17    Date for SLP Re-Evaluation  01/20/18    SLP Start Time  1450    SLP Stop Time   1530    SLP Time Calculation (min)  40 min    Activity Tolerance  Patient tolerated treatment well       Past Medical History:  Diagnosis Date  . GERD (gastroesophageal reflux disease)   . Hypertension   . Hypothyroidism   . Lumbar stenosis   . OSA (obstructive sleep apnea)   . Parkinson's disease ([place])   . REM behavioral disorder   . Urinary incontinence     Past Surgical History:  Procedure Laterality Date  . BACK SURGERY    . DEEP BRAIN STIMULATOR PLACEMENT     left 12/27/2012, right 12/10/2011, left revision 02/02/2013, replacement bilateral 11/21/2015  . KNEE SURGERY    . NEPHRECTOMY    . SPINAL FUSION    . TONSILLECTOMY      There were no vitals filed for this visit.  Subjective Assessment - 11/14/17 1452    Subjective  "I recognized that talking to Jeani Hawking" (talking too fast)    Patient is accompained by:  Family member    Currently in Pain?  Yes    Pain Score  5     Pain Location  Back    Pain Orientation  Posterior;Lower            ADULT SLP TREATMENT - 11/14/17 1450      General Information   Behavior/Cognition  Alert;Cooperative;Pleasant mood      Treatment Provided   Treatment provided  Cognitive-Linquistic      Cognitive-Linquistic Treatment   Treatment focused on  Dysarthria    Skilled Treatment  Pt reported he feels he is usually able to "catch himself" when speaking rapidly, however pt was speaking rapidly without awareness while telling SLP this.  Targeted slower rate in sentence level responses; pt was not always stimulable for slower rate with non-verbal cues, and SLP had to verbally cue pt occasionally for "choppy" speech. Progressed to short, simple conversation, with occasional min-mod A for carryover of intelligibility compensations. In multiparagraph reading, pt began with slow rate and "choppy" speech, which degraded slightly as the task progressed, though pt responded well to non-verbal cues when this occurred. SLP encouraged pt to use Meta Hatchet on his phone for feedback of his speech.      Assessment / Recommendations / Plan   Plan  Continue with current plan of care      Progression Toward Goals   Progression toward goals  Progressing toward goals       SLP Education - 11/14/17 1724    Education Details  use Meta Hatchet and text-to-speech to get feedback on how speech sounds    Person(s) Educated  Patient;Spouse    Methods  Explanation    Comprehension  Verbalized understanding       SLP Short Term Goals - 11/14/17 1717      SLP SHORT TERM GOAL #1   Title  in sentence resposnes pt will achieve >90% intelligibility with self correction, over  three sessions    Baseline  11-09-17    Time  1    Period  Weeks    Status  On-going      SLP SHORT TERM GOAL #2   Title  in 5 minutes simple conversation, pt will achieve 90% intelligibility with nonverbal cues    Time  1    Period  Weeks    Status  On-going      SLP SHORT TERM GOAL #3   Title  pt and/or wife will report med administration with puree as more successful than with H2O    Baseline  pt/wife report they have not tried puree    Time  1    Period  Weeks    Status  On-going      SLP SHORT TERM GOAL #4   Title  pt and/or wife will tell SLP of 3 overt s/s aspiration PNA    Time  1    Period  Weeks    Status  On-going       SLP Long Term Goals - 11/14/17 1719      SLP LONG TERM GOAL #1   Title  pt will achieve 90% intelligibility in 10 minutes simple conversation with  nonverbal cues, for three sessions    Time  5    Period  Weeks    Status  On-going      SLP LONG TERM GOAL #2   Title  in 5 minutes simple conversation pt will demo self correction for fast rate/unintelligible speech, to improve intelligiblity to 90%, over three sessions    Time  5    Period  Weeks    Status  On-going       Plan - 11/14/17 1724    Clinical Impression Statement  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 Pt was carrying over therapeutic behavior of slowed rate of speech to spontaneous conversation rarely during the session. Occasional min A required at spontaneous sentence level today. Pt would benefit from skilled ST focusing on pt's goal of improving communicative ability with family and in the community.    Speech Therapy Frequency  2x / week    Treatment/Interventions  Aspiration precaution training;SLP instruction and feedback;Compensatory strategies;Patient/family education;Internal/external aids;Multimodal communcation approach;Functional tasks;Cognitive reorganization;Oral motor exercises    Potential to Achieve Goals  Fair    Potential Considerations  Severity of impairments;Ability to learn/carryover information    Consulted and Agree with Plan of Care  Patient;Other (Comment)    Family Member Consulted  wife Patrick Ross       Patient will benefit from skilled therapeutic intervention in order to improve the following deficits and impairments:   Dysarthria and anarthria    Problem List Patient Active Problem List   Diagnosis Date Noted  . 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

## 2017-11-15 NOTE — Therapy (Signed)
[facility] [address] [place], [place], [address] Phone: [phone]   Fax:  [phone]  Physical Therapy Treatment  Patient Details  Name: Patrick Ross MRN: [id number] Date of Birth: Dec 29, 1945 Referring Provider: Dr. Wells Guiles Tat   Encounter Date: 11/14/2017  PT End of Session - 11/14/17 1434    Visit Number  6    Number of Visits  18    Date for PT Re-Evaluation  01/23/18    Authorization Type  Medicare, [employer] for Life (Will need 10th visit progress notes)    PT Start Time  1402    PT Stop Time  1444    PT Time Calculation (min)  42 min    Activity Tolerance  Patient limited by pain   takes frequent breaks due to increased pain   Behavior During Therapy  [facility] for tasks assessed/performed       Past Medical History:  Diagnosis Date  . GERD (gastroesophageal reflux disease)   . Hypertension   . Hypothyroidism   . Lumbar stenosis   . OSA (obstructive sleep apnea)   . Parkinson's disease ([place])   . REM behavioral disorder   . Urinary incontinence     Past Surgical History:  Procedure Laterality Date  . BACK SURGERY    . DEEP BRAIN STIMULATOR PLACEMENT     left 12/27/2012, right 12/10/2011, left revision 02/02/2013, replacement bilateral 11/21/2015  . KNEE SURGERY    . NEPHRECTOMY    . SPINAL FUSION    . TONSILLECTOMY      There were no vitals filed for this visit.  Subjective Assessment - 11/14/17 1409    Subjective  This Friday I'll get another round of shots, both SI joints and about 10-14 days later I'll feel bettter.     Patient is accompained by:  Family member    Pertinent History  PD diagnosed 2000, R STN DBS 11/2011, L STN DBS 11/2012 with revision 01/2013; lumbar fusion November 22, 2016, back surgeries, HTN, OSA; dystonia (Botox scheduled in September); receives injections in hips    Limitations  Walking;Standing    Patient Stated Goals  Pt's goals for physical therapy are to be able  to go on camping trip in October (walk with walking poles).    Pain Score  5     Pain Location  Back    Pain Orientation  Posterior;Lower    Pain Descriptors / Indicators  Aching    Pain Type  Chronic pain    Pain Onset  More than a month ago    Pain Frequency  Intermittent        Treatment--(see also clinical impression)  Gait training with Ustep walker x 60 ft x 2, 180 ft x1; walking poles 120 ft, 60 ft x 2. Focus on upright posture with engaged core and step width for stability.   Neuro re-ed--in // bars standing on blue airex cushion: feet apart EO progress to no UE support while engaging core and NOT holding his breath (this is difficult for pt). Was able to achieve proper posture (bringing shoulders forward over his hips) and maintain for up to 30 seconds at a time while counting out loud to avoid valsalva maneuver. Progresssed to EO with head turns, pt required single finger support on // bar, EC for <5 seconds and reaches for // bars, and step off posteriorly with single leg and then bring foot back up onto foam and regain balance.  PT Short Term Goals - 10/28/17 1642      PT SHORT TERM GOAL #1   Title  Pt will be independent with HEP to address balance, functional strengthening and gait.  TARGET:  11/25/17    Time  5    Period  Weeks    Status  New      PT SHORT TERM GOAL #2   Title  Pt will improve 5x sit<>stand score to less than or equal to 11.5 seconds (with UE support as needed), for improved transfer efficiency and safety.    Time  5    Period  Weeks    Status  New      PT SHORT TERM GOAL #3   Title  Pt will improve TUG score to less than or equal to 18 seconds for decreased fall risk.    Time  5    Period  Weeks    Status  New      PT SHORT TERM GOAL #4   Title  Pt will improve 6 MWT distance by 100 ft, with less than 2 points increase on pain scale for low back pain, for improved gait efficiency and safety.     Baseline  1063 ft in 6 MWT 10/27/17    Time  5    Period  Weeks    Status  New      PT SHORT TERM GOAL #5   Title  Berg Balance test to improve by at least 5 points, for decreased fall risk.    Baseline  Berg 25/56 10/27/17    Time  5    Period  Weeks    Status  New        PT Long Term Goals - 10/25/17 1215      PT LONG TERM GOAL #1   Title  Pt/family will verbalize understanding of fall prevention in home environment.  TARGET 12/23/17    Time  9    Period  Weeks    Status  New    Target Date  12/23/17      PT LONG TERM GOAL #2   Title  Pt will improve gait velocity to at least 2.5 ft/sec for improved gait efficiency and safety.    Time  9    Period  Weeks    Status  New    Target Date  12/23/17      PT LONG TERM GOAL #3   Title  Pt will ambulate at least 300 ft, indoor/outdoor surfaces, with bilateral walking poles, with supervision, for pt's goal to negotiate short hike for 72th birthday.    Time  9    Period  Weeks    Status  New    Target Date  12/23/17      PT LONG TERM GOAL #4   Title  Pt will improve TUG cognitive score to less than or equal to 18 seconds for decreased fall risk/improved dual tasking with gait.    Time  5    Period  Weeks    Status  New    Target Date  12/23/17      PT LONG TERM GOAL #5   Title  Pt/family will verbalize techniques to reduce festinating with gait and turns.    Time  5    Period  Weeks    Status  New    Target Date  12/23/17            Plan - 11/14/17 1700  Clinical Impression Statement  Back pain less severe today and pt able to focus on gait and balance training. Patient continues with standing posture with weak/relaxed abdominals with shoulders resting posterior to his hips in standing. With verbal and tactile cues he is able to engage his abdominal muscles and achieve upright posture, requiring vc for breath support while maintianing position. Able to focus on returning to this posture during balance and gait  training with pt becoming more proficient at engaging core and inproved posture as the session progressed. Able to progress to using walking poles (pt's personal goal to go on a hike for his upcoming birthday--he admits this may not happen, however he  has a former PT who will be accompanying him to [place]). Required vc's for increasing step width to reduce near-scissoring with bil LEs. Balance training also focused on posture with standing on compliant surface for incr challenge to vestibular system. Patient can continue to benefit from PT to achieve his goals.     Rehab Potential  Good    Clinical Impairments Affecting Rehab Potential  hx of multiple back surgeries, bilateral DBS, length of time since dx of Parkinson's; pt and wife are motivated for participation in therapy, pt active in community and exercise    PT Frequency  2x / week    PT Duration  Other (comment)   9 weeks   PT Treatment/Interventions  ADLs/Self Care Home Management;DME Instruction;Balance training;Therapeutic exercise;Therapeutic activities;Functional mobility training;Gait training;Neuromuscular re-education;Patient/family education    PT Next Visit Plan  chk STGs by 9/27; work on core stability (especially in upright), balance, and gait training with walking poles (try outdoors?)    Consulted and Agree with Plan of Care  Patient;Family member/caregiver    Family Member Consulted  wife       Patient will benefit from skilled therapeutic intervention in order to improve the following deficits and impairments:  Abnormal gait, Decreased balance, Decreased mobility, Decreased strength, Difficulty walking, Postural dysfunction, Decreased safety awareness  Visit Diagnosis: Abnormal posture  Unsteadiness on feet     Problem List Patient Active Problem List   Diagnosis Date Noted  . 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 [place], [place], [address] Phone: [phone]   Fax:  [phone]  Name: Patrick Ross MRN: [id number] Date of Birth: Oct 26, 1945

## 2017-11-16 ENCOUNTER — Ambulatory Visit: Payer: Medicare Other

## 2017-11-16 ENCOUNTER — Ambulatory Visit: Payer: Medicare Other | Admitting: Occupational Therapy

## 2017-11-16 ENCOUNTER — Ambulatory Visit: Payer: Medicare Other | Admitting: Physical Therapy

## 2017-11-16 ENCOUNTER — Encounter: Payer: Self-pay | Admitting: Physical Therapy

## 2017-11-16 DIAGNOSIS — R29818 Other symptoms and signs involving the nervous system: Secondary | ICD-10-CM | POA: Diagnosis not present

## 2017-11-16 DIAGNOSIS — R41841 Cognitive communication deficit: Secondary | ICD-10-CM

## 2017-11-16 DIAGNOSIS — R293 Abnormal posture: Secondary | ICD-10-CM

## 2017-11-16 DIAGNOSIS — R41840 Attention and concentration deficit: Secondary | ICD-10-CM

## 2017-11-16 DIAGNOSIS — R29898 Other symptoms and signs involving the musculoskeletal system: Secondary | ICD-10-CM

## 2017-11-16 DIAGNOSIS — R471 Dysarthria and anarthria: Secondary | ICD-10-CM

## 2017-11-16 DIAGNOSIS — R278 Other lack of coordination: Secondary | ICD-10-CM

## 2017-11-16 DIAGNOSIS — R2681 Unsteadiness on feet: Secondary | ICD-10-CM

## 2017-11-16 DIAGNOSIS — R2689 Other abnormalities of gait and mobility: Secondary | ICD-10-CM

## 2017-11-16 NOTE — Therapy (Signed)
[facility] [address] [place], [place], [address] Phone: [phone]   Fax:  [phone]  Speech Language Pathology Treatment  Patient Details  Name: Patrick Ross MRN: [id number] Date of Birth: Dec 22, 1945 Referring Provider: Alonza Bogus   Encounter Date: 11/16/2017  End of Session - 11/16/17 1518    Visit Number  7    Number of Visits  17    Date for SLP Re-Evaluation  01/20/18    SLP Start Time  1150    SLP Stop Time   1230    SLP Time Calculation (min)  40 min    Activity Tolerance  Patient tolerated treatment well       Past Medical History:  Diagnosis Date  . GERD (gastroesophageal reflux disease)   . Hypertension   . Hypothyroidism   . Lumbar stenosis   . OSA (obstructive sleep apnea)   . Parkinson's disease ([place])   . REM behavioral disorder   . Urinary incontinence     Past Surgical History:  Procedure Laterality Date  . BACK SURGERY    . DEEP BRAIN STIMULATOR PLACEMENT     left 12/27/2012, right 12/10/2011, left revision 02/02/2013, replacement bilateral 11/21/2015  . KNEE SURGERY    . NEPHRECTOMY    . SPINAL FUSION    . TONSILLECTOMY      There were no vitals filed for this visit.  Subjective Assessment - 11/16/17 1152    Subjective  "Good time to come to speech therapy - after two grueling sessions of PT and OT."    Patient is accompained by:  Family member   Jackelyn Poling, wife   Currently in Pain?  Yes    Pain Score  4     Pain Location  Back    Pain Orientation  Lower    Pain Descriptors / Indicators  Aching    Pain Type  Chronic pain    Pain Radiating Towards  stems from SI joint    Pain Onset  More than a month ago    Pain Frequency  Intermittent    Aggravating Factors   walking, laying down    Pain Relieving Factors  sitting            ADULT SLP TREATMENT - 11/16/17 1153      General Information   Behavior/Cognition  Alert;Cooperative;Pleasant mood      Treatment  Provided   Treatment provided  Cognitive-Linquistic      Cognitive-Linquistic Treatment   Treatment focused on  Dysarthria    Skilled Treatment  Pt conveys last time SLP worked on reading and sentences. SLP engaged pt in conversation about high interest items (camping, church) and pt maintained slower rate functionally 90% of the time. He self-corrected faster speech rate 3/4 times. After this conversation of 15 minutes, pt fatigued and self awareness decr'd as well as frequency of slower speech. With SLP nonverbal and verbal cues, occasionally, pt improved the intelligibliity of his speech to 90%. SLP educated pt on external cues for slower rate.      Assessment / Recommendations / Plan   Plan  Continue with current plan of care      Progression Toward Goals   Progression toward goals  Progressing toward goals       SLP Education - 11/16/17 1518    Education Details  external cues for slower rate    Person(s) Educated  Patient;Spouse    Methods  Explanation    Comprehension  Verbalized  understanding       SLP Short Term Goals - 11/16/17 1519      SLP SHORT TERM GOAL #1   Title  in sentence resposnes pt will achieve >90% intelligibility with self correction, over three sessions    Status  Partially Met   2/3 sessinos     SLP SHORT TERM GOAL #2   Title  in 5 minutes simple conversation, pt will achieve 90% intelligibility with nonverbal cues    Status  Achieved      SLP SHORT TERM GOAL #3   Title  pt and/or wife will report med administration with puree as more successful than with H2O    Status  Deferred   to [place] #4   Title  pt and/or wife will tell SLP of 3 overt s/s aspiration PNA    Time  1    Status  Deferred       SLP Long Term Goals - 11/16/17 1521      SLP LONG TERM GOAL #1   Title  pt will achieve 90% intelligibility in 10 minutes simple conversation with nonverbal cues, for three sessions    Time  5    Period  Weeks    Status   On-going      SLP LONG TERM GOAL #2   Title  in 5 minutes simple conversation pt will demo self correction for fast rate/unintelligible speech, to improve intelligiblity to 90%, over three sessions    Time  5    Period  Weeks    Status  On-going      SLP LONG TERM GOAL #3   Title  pt and/or wife will report med administration with puree as more successful than with H2O    Time  5    Period  Weeks    Status  New       Plan - 11/16/17 1519    Clinical Impression Statement  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 Pt was carrying over therapeutic behavior of slowed rate of speech to spontaneous conversation better today during the session than last session with this SLP. Pt would cont to benefit from skilled ST focusing on pt's goal of improving communicative ability with family and in the community.    Speech Therapy Frequency  2x / week    Treatment/Interventions  Aspiration precaution training;SLP instruction and feedback;Compensatory strategies;Patient/family education;Internal/external aids;Multimodal communcation approach;Functional tasks;Cognitive reorganization;Oral motor exercises    Potential to Achieve Goals  Fair    Potential Considerations  Severity of impairments;Ability to learn/carryover information    Consulted and Agree with Plan of Care  Patient;Other (Comment)    Family Member Consulted  wife [name]       Patient will benefit from skilled therapeutic intervention in order to improve the following deficits and impairments:   Dysarthria and anarthria  Cognitive communication deficit    Problem List Patient Active Problem List   Diagnosis Date Noted  . 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 [place], [place], [address] Phone: [phone]   Fax:   [phone]   Name: Patrick Ross MRN: [id number] Date of Birth: 1945/05/06

## 2017-11-16 NOTE — Patient Instructions (Signed)
  Please complete the assigned speech therapy homework prior to your next session and return it to the speech therapist at your next visit.  

## 2017-11-16 NOTE — Therapy (Signed)
[facility] [address] [place], [place], [address] Phone: [phone]   Fax:  [phone]  Occupational Therapy Treatment  Patient Details  Name: Patrick Ross MRN: [id number] Date of Birth: 1945/04/15 Referring Provider: Dr. Wells Guiles Tat   Encounter Date: 11/16/2017  OT End of Session - 11/16/17 1304    Visit Number  5    Number of Visits  17    Date for OT Re-Evaluation  12/24/17    Authorization Type  Medicare, Tricare for Life    Authorization Time Period  cert. period 10/25/17-01/23/18    Authorization - Visit Number  5    Authorization - Number of Visits  10    OT Start Time  1105    OT Stop Time  1145    OT Time Calculation (min)  40 min    Activity Tolerance  Patient tolerated treatment well    Behavior During Therapy  WFL for tasks assessed/performed       Past Medical History:  Diagnosis Date  . GERD (gastroesophageal reflux disease)   . Hypertension   . Hypothyroidism   . Lumbar stenosis   . OSA (obstructive sleep apnea)   . Parkinson's disease ([place])   . REM behavioral disorder   . Urinary incontinence     Past Surgical History:  Procedure Laterality Date  . BACK SURGERY    . DEEP BRAIN STIMULATOR PLACEMENT     left 12/27/2012, right 12/10/2011, left revision 02/02/2013, replacement bilateral 11/21/2015  . KNEE SURGERY    . NEPHRECTOMY    . SPINAL FUSION    . TONSILLECTOMY      There were no vitals filed for this visit.  Subjective Assessment - 11/16/17 1106    Pertinent History  GERD, HTN, hypothyroidism, lumbar stenosis, OSA, REM behavioral disorder, Urinary incontinence, hx of multiple back surgeries (last 2018 lumbar fusion), s/p bilateral DBS (2778,2423,+NTIRWERX 2014)     Patient Stated Goals  be able to tie both shoes consistently, be able to go on a hike in October     Currently in Pain?  Yes    Pain Score  4     Pain Location  Back    Pain Orientation  Lower    Pain  Descriptors / Indicators  Aching    Pain Type  Chronic pain    Pain Onset  More than a month ago    Pain Frequency  Intermittent    Aggravating Factors   lying down, walking    Pain Relieving Factors  siiting               Treatment:Dynamic step and reach at countertop, with emphasis on big movements, pt was able to perfrom to right side however due to back pain, task was discontinued to the left side after retrieving several items from cabinet. Pt demonstrates poor overall standing tolerance.            OT Education - 11/16/17 1307    Education Details  Pt practiced tying shoes with leg crossed at knee, donning doffing shorts, reacher recommended if difficulty, strategies for sit to stand from chair leaning forward    Person(s) Educated  Patient;Spouse    Methods  Explanation;Demonstration;Verbal cues;Handout    Comprehension  Verbalized understanding;Returned demonstration;Verbal cues required       OT Short Term Goals - 11/16/17 1107      OT SHORT TERM GOAL #1   Title  Pt will be independent with updated PD-specific  HEP.--check STGs 11/24/17    Status  On-going      OT SHORT TERM GOAL #2   Title  Pt will improve bilateral functional reaching/coordination for ADLs as shown by improving score on box and blocks test by at least 5 blocks bilaterally.    Status  On-going      OT SHORT TERM GOAL #3   Title  Pt will be able to write address and 2 sentences with good legibility and only min decr in size.    Status  On-going      OT SHORT TERM GOAL #4   Title  Pt will be able to tie both shoes mod I.    Status  Achieved        OT Long Term Goals - 10/25/17 1812      OT LONG TERM GOAL #1   Title  Pt will verbalize understanding of adaptive strategies for ADLs/IADLs to improve ease, movement quality, safety, and prevent future complications.--check LTGs 12/24/17    Time  8    Period  Weeks    Status  New      OT LONG TERM GOAL #2   Title  Pt will improve  balance/functional reaching for IADLs as shown by reaching at least 10 inches with each UE without LOB.    Baseline  R-8", L-9" (with steps and close supervision)    Time  8    Period  Weeks    Status  New      OT LONG TERM GOAL #3   Title  Pt will be able to stand to complete grooming tasks at counter.    Time  8    Status  New      OT LONG TERM GOAL #4   Title  Pt will improve ability to dress as shown by fastening/unfastening 3 buttons in 46sec or less.    Baseline  56.50sec    Time  8    Period  Weeks    Status  New      OT LONG TERM GOAL #5   Title  Pt will improve dressing as shown by improving time on PPT#4 by at least 5sec with good balance/use of strategies.    Baseline  26.44sec    Time  8    Period  Weeks    Status  New      Long Term Additional Goals   Additional Long Term Goals  Yes      OT LONG TERM GOAL #6   Title  Pt will verbalize understanding of continuing fitness opportunities/appropriate community resources.    Baseline  --    Time  8    Period  Weeks    Status  New            Plan - 11/16/17 1305    Clinical Impression Statement  Pt is progressing towards goals. He can benefit from continued reinforcement of positioning for improved posture and postioning to minimize back pain. Pt was limited by back pain today.    Occupational Profile and client history currently impacting functional performance  Pt is independent with BADLs, but needs assist for tying shoes at times.  Pt reports decrease in IADL performance (driving, community activities) and would like to return to hiking.  Pt reports decline, particularly after last back surgeries.      Occupational performance deficits (Please refer to evaluation for details):  ADL's;IADL's;Leisure;Social Participation    Rehab Potential  Good    Current  Impairments/barriers affecting progress:  hx of multiple back surgeries, severity of deficits    OT Frequency  2x / week    OT Duration  8 weeks    OT  Treatment/Interventions  Self-care/ADL training;Cryotherapy;Paraffin;Therapeutic exercise;DME and/or AE instruction;Functional Mobility Training;Cognitive remediation/compensation;Balance training;Visual/perceptual remediation/compensation;Manual Therapy;Neuromuscular education;Fluidtherapy;Ultrasound;Moist Heat;Energy conservation;Passive range of motion;Therapeutic activities;Patient/family education    Plan  posture, proper positioning for ADL tasks, core stability, ? quadraped, coordination/reaching    Consulted and Agree with Plan of Care  Patient    Family Member Consulted  wife       Patient will benefit from skilled therapeutic intervention in order to improve the following deficits and impairments:  Decreased balance, Decreased mobility, Decreased endurance, Difficulty walking, Impaired vision/preception, Pain, Improper body mechanics, Impaired tone, Impaired perceived functional ability, Decreased cognition, Decreased activity tolerance, Decreased coordination, Decreased knowledge of use of DME, Decreased safety awareness, Impaired UE functional use, Improper spinal/pelvic alignment  Visit Diagnosis: Other symptoms and signs involving the nervous system  Other symptoms and signs involving the musculoskeletal system  Other lack of coordination  Attention and concentration deficit    Problem List Patient Active Problem List   Diagnosis Date Noted  . 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 [place], [place], [address] Phone: [phone]   Fax:  [phone]  Name: Patrick Ross MRN: [id number] Date of Birth: [birth year]

## 2017-11-17 NOTE — Therapy (Signed)
[facility] [address] [place], [place], [address] Phone: [phone]   Fax:  [phone]  Physical Therapy Treatment  Patient Details  Name: Patrick Ross MRN: [id number] Date of Birth: [birth year] Referring Provider: Dr. Wells Guiles Tat   Encounter Date: 11/16/2017  PT End of Session - 11/17/17 0841    Visit Number  7    Number of Visits  18    Date for PT Re-Evaluation  01/23/18    Authorization Type  Medicare, Tricare for Life (Will need 10th visit progress notes)    PT Start Time  1018    PT Stop Time  1059    PT Time Calculation (min)  41 min    Equipment Utilized During Treatment  Gait belt    Activity Tolerance  Patient limited by fatigue;Patient limited by pain    Behavior During Therapy  [facility] for tasks assessed/performed       Past Medical History:  Diagnosis Date  . GERD (gastroesophageal reflux disease)   . Hypertension   . Hypothyroidism   . Lumbar stenosis   . OSA (obstructive sleep apnea)   . Parkinson's disease ([place])   . REM behavioral disorder   . Urinary incontinence     Past Surgical History:  Procedure Laterality Date  . BACK SURGERY    . DEEP BRAIN STIMULATOR PLACEMENT     left 12/27/2012, right 12/10/2011, left revision 02/02/2013, replacement bilateral 11/21/2015  . KNEE SURGERY    . NEPHRECTOMY    . SPINAL FUSION    . TONSILLECTOMY      There were no vitals filed for this visit.  Subjective Assessment - 11/16/17 1021    Subjective  Really hurting today and yesterday.    Patient is accompained by:  Family member    Pertinent History  PD diagnosed 2000, R STN DBS 11/2011, L STN DBS 11/2012 with revision 01/2013; lumbar fusion November 22, 2016, back surgeries, HTN, OSA; dystonia (Botox scheduled in September); receives injections in hips    Limitations  Walking;Standing    Patient Stated Goals  Pt's goals for physical therapy are to be able to go on camping trip in October (walk  with walking poles).    Currently in Pain?  Yes    Pain Score  4    can jump up to 8-9/10 real quick   Pain Location  Back    Pain Orientation  Posterior;Lower    Pain Descriptors / Indicators  Aching    Pain Type  Chronic pain    Pain Radiating Towards  stems from "SI join"    Pain Onset  More than a month ago    Aggravating Factors   worse lying down on soft bed; walking and moving around    Pain Relieving Factors  sitting down                       [facility] Adult PT Treatment/Exercise - 11/17/17 0825      Transfers   Transfers  Sit to Stand;Stand to Sit    Sit to Stand  6: Modified independent (Device/Increase time);With upper extremity assist;From chair/3-in-1    Stand to Sit  5: Supervision;With upper extremity assist;To chair/3-in-1    Comments  Cues for forward lean, cues for upright posture in standing.  To avoid posterior lean, with shoulders posterior to hips.      Ambulation/Gait   Ambulation/Gait  Yes    Ambulation/Gait Assistance  5: Supervision;4:  Min guard    Ambulation/Gait Assistance Details  By end of lap around gym, pt notes increased leg weakness, decreased foot clearance, with pt requesting to sit each time after one lap.    Ambulation Distance (Feet)  120 Feet    (x 1 with U-step RW) x 2 with bilateral poles)   Assistive device  --   bilateral walking poles   Gait Pattern  Step-through pattern;Decreased step length - right;Decreased step length - left;Decreased dorsiflexion - right;Decreased dorsiflexion - left;Scissoring;Festinating;Narrow base of support    Ambulation Surface  Level;Indoor      Posture/Postural Control   Posture/Postural Control  Postural limitations    Posture Comments  Seated upright posture, with cues to bring shoulders forward in line with hips, to avoid sitting posteriorly and supporting trunk with arms posterior to body.      Exercises   Exercises  Knee/Hip      Knee/Hip Exercises: Stretches   Active Hamstring  Stretch  Right;Left;3 reps;30 seconds   bilateral,seating, foot propped at floor         Balance Exercises - 11/16/17 1032      Balance Exercises: Standing   Rockerboard  Anterior/posterior;UE support;10 reps   Hip/ankle strategy work on Arts development officer  On rockerboard:  standing balanced on board with alternating UE lifts, then with UE support-head turns, head nods with min guard assistance.  Standing on rockerboard EO balancing steady in midline, 3 reps 10 seconds EO (cues for abdominal activation)          PT Short Term Goals - 10/28/17 1642      PT SHORT TERM GOAL #1   Title  Pt will be independent with HEP to address balance, functional strengthening and gait.  TARGET:  11/25/17    Time  5    Period  Weeks    Status  New      PT SHORT TERM GOAL #2   Title  Pt will improve 5x sit<>stand score to less than or equal to 11.5 seconds (with UE support as needed), for improved transfer efficiency and safety.    Time  5    Period  Weeks    Status  New      PT SHORT TERM GOAL #3   Title  Pt will improve TUG score to less than or equal to 18 seconds for decreased fall risk.    Time  5    Period  Weeks    Status  New      PT SHORT TERM GOAL #4   Title  Pt will improve 6 MWT distance by 100 ft, with less than 2 points increase on pain scale for low back pain, for improved gait efficiency and safety.    Baseline  1063 ft in 6 MWT 10/27/17    Time  5    Period  Weeks    Status  New      PT SHORT TERM GOAL #5   Title  Berg Balance test to improve by at least 5 points, for decreased fall risk.    Baseline  Berg 25/56 10/27/17    Time  5    Period  Weeks    Status  New        PT Long Term Goals - 10/25/17 1215      PT LONG TERM GOAL #1   Title  Pt/family will verbalize understanding of fall prevention in home environment.  TARGET 12/23/17    Time  9    Period  Weeks    Status  New    Target Date  12/23/17      PT LONG TERM GOAL #2   Title  Pt  will improve gait velocity to at least 2.5 ft/sec for improved gait efficiency and safety.    Time  9    Period  Weeks    Status  New    Target Date  12/23/17      PT LONG TERM GOAL #3   Title  Pt will ambulate at least 300 ft, indoor/outdoor surfaces, with bilateral walking poles, with supervision, for pt's goal to negotiate short hike for 72th birthday.    Time  9    Period  Weeks    Status  New    Target Date  12/23/17      PT LONG TERM GOAL #4   Title  Pt will improve TUG cognitive score to less than or equal to 18 seconds for decreased fall risk/improved dual tasking with gait.    Time  5    Period  Weeks    Status  New    Target Date  12/23/17      PT LONG TERM GOAL #5   Title  Pt/family will verbalize techniques to reduce festinating with gait and turns.    Time  5    Period  Weeks    Status  New    Target Date  12/23/17            Plan - 11/17/17 0843    Clinical Impression Statement  Skilled PT session focused on balance activities in parallel bars and seated balance exercises for balance and posture work.  Also focused on indoor gait with bilateral walking poles.  Pt limited due to pain and weakness of lower extremities after prolonged period of standing or after one lap of gait.  Pt will continue to benefit from skilled PT to address posture, strength, balance, gait training.    Rehab Potential  Good    Clinical Impairments Affecting Rehab Potential  hx of multiple back surgeries, bilateral DBS, length of time since dx of Parkinson's; pt and wife are motivated for participation in therapy, pt active in community and exercise    PT Frequency  2x / week    PT Duration  Other (comment)   9 weeks   PT Treatment/Interventions  ADLs/Self Care Home Management;DME Instruction;Balance training;Therapeutic exercise;Therapeutic activities;Functional mobility training;Gait training;Neuromuscular re-education;Patient/family education    PT Next Visit Plan  chk STGs by 9/27;  work on core stability (especially in upright), balance, and gait training with walking poles (try outdoors?)    Consulted and Agree with Plan of Care  Patient;Family member/caregiver    Family Member Consulted  wife       Patient will benefit from skilled therapeutic intervention in order to improve the following deficits and impairments:  Abnormal gait, Decreased balance, Decreased mobility, Decreased strength, Difficulty walking, Postural dysfunction, Decreased safety awareness  Visit Diagnosis: Abnormal posture  Unsteadiness on feet  Other abnormalities of gait and mobility     Problem List Patient Active Problem List   Diagnosis Date Noted  . 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 11/17/2017, 8:46 AM  Frazier Butt., PT   [facility] [address] [place], [place], [address] Phone: [phone]   Fax:  [phone]  Name: Patrick Ross MRN: [id number] Date of Birth: 26-Jul-1945

## 2017-11-18 ENCOUNTER — Encounter: Payer: Self-pay | Admitting: Physical Medicine & Rehabilitation

## 2017-11-18 ENCOUNTER — Ambulatory Visit (HOSPITAL_BASED_OUTPATIENT_CLINIC_OR_DEPARTMENT_OTHER): Payer: Medicare Other | Admitting: Physical Medicine & Rehabilitation

## 2017-11-18 ENCOUNTER — Other Ambulatory Visit: Payer: Self-pay

## 2017-11-18 ENCOUNTER — Ambulatory Visit: Payer: Medicare Other

## 2017-11-18 VITALS — BP 136/87 | HR 95 | Ht 73.0 in | Wt 224.4 lb

## 2017-11-18 DIAGNOSIS — R41841 Cognitive communication deficit: Secondary | ICD-10-CM

## 2017-11-18 DIAGNOSIS — M533 Sacrococcygeal disorders, not elsewhere classified: Secondary | ICD-10-CM

## 2017-11-18 DIAGNOSIS — R29818 Other symptoms and signs involving the nervous system: Secondary | ICD-10-CM | POA: Diagnosis not present

## 2017-11-18 DIAGNOSIS — G248 Other dystonia: Secondary | ICD-10-CM | POA: Diagnosis not present

## 2017-11-18 DIAGNOSIS — R471 Dysarthria and anarthria: Secondary | ICD-10-CM

## 2017-11-18 NOTE — Patient Instructions (Signed)
Sacroiliac injection was performed today. A combination of a naming medicine plus a cortisone medicine was injected. The injection was done under x-ray guidance. This procedure has been performed to help reduce low back and buttocks pain as well as potentially hip pain. The duration of this injection is variable lasting from hours to  Months. It may repeated if needed. 

## 2017-11-18 NOTE — Progress Notes (Signed)
Bilateral sacroiliac injections under fluoroscopic guidance  Indication: Low back and buttocks pain not relieved by medication management and other conservative care.  Informed consent was obtained after describing risks and benefits of the procedure with the patient, this includes bleeding, bruising, infection, paralysis and medication side effects. The patient wishes to proceed and has given written consent. The patient was placed in a prone position. The lumbar and sacral area was marked and prepped with Betadine. A 25-gauge 1-1/2 inch needle was inserted into the skin and subcutaneous tissue and 1 mL of 1% lidocaine was injected into each side. Then a 25-gauge 3 inch spinal needle was inserted under fluoroscopic guidance into the left sacroiliac joint. AP and lateral images were utilized. Isovue200x0.5 mL under live fluoroscopy demonstrated no intravascular uptake. Then a solution containing one ML of 6 mg per mL Celestone in 2 ML of 2% lidocaine MPF was injected x1.5 mL. This same procedure was repeated on the right side using the same needle, injectate, and technique. Patient tolerated the procedure well. Post procedure instructions were given. Please see post procedure form. 

## 2017-11-18 NOTE — Patient Instructions (Signed)
  Please complete the assigned speech therapy homework prior to your next session and return it to the speech therapist at your next visit.  

## 2017-11-18 NOTE — Progress Notes (Signed)
  [facility]   Name: MAZIAH KEELING DOB:07-24-1945 MRN: [id number]  Date:11/18/2017  Physician: Alysia Penna, MD    Nurse/CMA: Truman Hayward CMA  Allergies:  Allergies  Allergen Reactions  . Other Other (See Comments)    Surgical spray prior to adhesive causes blisters  . Sulfamethoxazole Other (See Comments)    Unsure--childhood allergy    Consent Signed: Yes.    Is patient diabetic? No.  CBG today?  Pregnant: No. LMP: No LMP for male patient. (age 70-55)  Anticoagulants: no Anti-inflammatory: no Antibiotics: no  Procedure: bilateral sacroiliac steroid injections Position: Prone Start Time: 2:52pm End Time: 2:58pm Fluoro Time: 21  RN/CMA Letesha Klecker RN Lee CMA    Time 205 3:06pm    BP 136/87 114/ 77    Pulse 95 92    Respirations 14 14    O2 Sat 93 95    S/S 6 6    Pain Level 6/10      D/C home with wife, patient A & O X 3, D/C instructions reviewed, and sits independently.

## 2017-11-18 NOTE — Therapy (Signed)
[facility] [address] [place], [place], [address] Phone: [phone]   Fax:  [phone]  Speech Language Pathology Treatment  Patient Details  Name: Patrick Ross MRN: [id number] Date of Birth: March 03, 1945 Referring Provider: Alonza Bogus   Encounter Date: 11/18/2017  End of Session - 11/18/17 1316    Visit Number  8    Number of Visits  17    Date for SLP Re-Evaluation  01/20/18    SLP Start Time  1149    SLP Stop Time   1230    SLP Time Calculation (min)  41 min    Activity Tolerance  Patient tolerated treatment well       Past Medical History:  Diagnosis Date  . GERD (gastroesophageal reflux disease)   . Hypertension   . Hypothyroidism   . Lumbar stenosis   . OSA (obstructive sleep apnea)   . Parkinson's disease ([place])   . REM behavioral disorder   . Urinary incontinence     Past Surgical History:  Procedure Laterality Date  . BACK SURGERY    . DEEP BRAIN STIMULATOR PLACEMENT     left 12/27/2012, right 12/10/2011, left revision 02/02/2013, replacement bilateral 11/21/2015  . KNEE SURGERY    . NEPHRECTOMY    . SPINAL FUSION    . TONSILLECTOMY      There were no vitals filed for this visit.  Subjective Assessment - 11/18/17 1154    Subjective  "Animals cannot add -butthey canmultiply."    Patient is accompained by:  Family member   wife   Currently in Pain?  Yes    Pain Score  4     Pain Location  Back    Pain Orientation  Lower    Pain Descriptors / Indicators  Aching    Pain Type  Chronic pain    Pain Onset  More than a month ago    Pain Frequency  Intermittent    Aggravating Factors   walking, lying down    Pain Relieving Factors  sitting/resting            ADULT SLP TREATMENT - 11/18/17 1156      General Information   Behavior/Cognition  Alert;Cooperative;Pleasant mood      Treatment Provided   Treatment provided  Cognitive-Linquistic      Cognitive-Linquistic Treatment    Treatment focused on  Dysarthria    Skilled Treatment  Pt enters room with faster rate of speech than with SLP last two sessions. In sentnece responses with structured tasks pt req'd min-mod A (verbal and nonverbal) usually to slow rate. Pt self-corrected 10% of the time when rate was too fast for intelligiblity by SLP. Nonverbal cue was very effective at having pt repeat his utterance.Pt much more tangential in conversation today than in previous sessions.      Assessment / Recommendations / Plan   Plan  Continue with current plan of care      Progression Toward Goals   Progression toward goals  Progressing toward goals         SLP Short Term Goals - 11/18/17 1318      SLP SHORT TERM GOAL #1   Title  in sentence resposnes pt will achieve >90% intelligibility with self correction, over three sessions    Status  Partially Met   2/3 sessinos     SLP SHORT TERM GOAL #2   Title  in 5 minutes simple conversation, pt will achieve 90% intelligibility with nonverbal  cues    Status  Achieved      SLP SHORT TERM GOAL #3   Title  pt and/or wife will report med administration with puree as more successful than with H2O    Status  Deferred   to [place] #4   Title  pt and/or wife will tell SLP of 3 overt s/s aspiration PNA    Time  1    Status  Deferred       SLP Long Term Goals - 11/18/17 1318      SLP LONG TERM GOAL #1   Title  pt will achieve 90% intelligibility in 10 minutes simple conversation with nonverbal cues, for three sessions    Time  5    Period  Weeks    Status  On-going      SLP LONG TERM GOAL #2   Title  in 5 minutes simple conversation pt will demo self correction for fast rate/unintelligible speech, to improve intelligiblity to 90%, over three sessions    Time  5    Period  Weeks    Status  On-going      SLP LONG TERM GOAL #3   Title  pt and/or wife will report med administration with puree as more successful than with H2O    Time  5     Period  Weeks    Status  On-going       Plan - 11/18/17 1317    Clinical Impression Statement  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 Pt had more difficulty today carrying over therapeutic behavior of slowed rate of speech to both structured tasks and in spontaneous conversation.  Pt would cont to benefit from skilled ST focusing on pt's goal of improving communicative ability with family and in the community.    Speech Therapy Frequency  2x / week    Duration  --   8 weeks/17 sessions   Treatment/Interventions  Aspiration precaution training;SLP instruction and feedback;Compensatory strategies;Patient/family education;Internal/external aids;Multimodal communcation approach;Functional tasks;Cognitive reorganization;Oral motor exercises    Potential to Achieve Goals  Fair    Potential Considerations  Severity of impairments;Ability to learn/carryover information    Consulted and Agree with Plan of Care  Patient;Other (Comment)    Family Member Consulted  wife [name]       Patient will benefit from skilled therapeutic intervention in order to improve the following deficits and impairments:   Dysarthria and anarthria  Cognitive communication deficit    Problem List Patient Active Problem List   Diagnosis Date Noted  . 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 [place], [place], [address] Phone: [phone]   Fax:  [phone]   Name: Patrick Ross MRN: [id number] Date of Birth: 08/18/1945

## 2017-11-22 ENCOUNTER — Ambulatory Visit: Payer: Medicare Other | Admitting: Occupational Therapy

## 2017-11-22 ENCOUNTER — Encounter: Payer: Self-pay | Admitting: Occupational Therapy

## 2017-11-22 ENCOUNTER — Ambulatory Visit: Payer: Medicare Other | Admitting: Physical Therapy

## 2017-11-22 ENCOUNTER — Encounter: Payer: Self-pay | Admitting: Speech Pathology

## 2017-11-22 ENCOUNTER — Encounter: Payer: Self-pay | Admitting: Physical Therapy

## 2017-11-22 ENCOUNTER — Ambulatory Visit: Payer: Medicare Other | Admitting: Speech Pathology

## 2017-11-22 DIAGNOSIS — R29818 Other symptoms and signs involving the nervous system: Secondary | ICD-10-CM

## 2017-11-22 DIAGNOSIS — R293 Abnormal posture: Secondary | ICD-10-CM

## 2017-11-22 DIAGNOSIS — R29898 Other symptoms and signs involving the musculoskeletal system: Secondary | ICD-10-CM

## 2017-11-22 DIAGNOSIS — R2689 Other abnormalities of gait and mobility: Secondary | ICD-10-CM

## 2017-11-22 DIAGNOSIS — R471 Dysarthria and anarthria: Secondary | ICD-10-CM

## 2017-11-22 DIAGNOSIS — R278 Other lack of coordination: Secondary | ICD-10-CM

## 2017-11-22 DIAGNOSIS — R41840 Attention and concentration deficit: Secondary | ICD-10-CM

## 2017-11-22 NOTE — Therapy (Signed)
[facility] [address] [place], [place], [address] Phone: [phone]   Fax:  [phone]  Speech Language Pathology Treatment  Patient Details  Name: Patrick Ross MRN: [id number] Date of Birth: Nov 10, 1945 Referring Provider: Alonza Bogus   Encounter Date: 11/22/2017  End of Session - 11/22/17 1126    Visit Number  9    Number of Visits  17    Date for SLP Re-Evaluation  01/20/18    SLP Start Time  1100    SLP Stop Time   1145    SLP Time Calculation (min)  45 min    Activity Tolerance  Patient tolerated treatment well       Past Medical History:  Diagnosis Date  . GERD (gastroesophageal reflux disease)   . Hypertension   . Hypothyroidism   . Lumbar stenosis   . OSA (obstructive sleep apnea)   . Parkinson's disease ([place])   . REM behavioral disorder   . Urinary incontinence     Past Surgical History:  Procedure Laterality Date  . BACK SURGERY    . DEEP BRAIN STIMULATOR PLACEMENT     left 12/27/2012, right 12/10/2011, left revision 02/02/2013, replacement bilateral 11/21/2015  . KNEE SURGERY    . NEPHRECTOMY    . SPINAL FUSION    . TONSILLECTOMY      There were no vitals filed for this visit.  Subjective Assessment - 11/22/17 1117    Pain Onset  More than a month ago            ADULT SLP TREATMENT - 11/22/17 1117      General Information   Behavior/Cognition  Alert;Cooperative;Pleasant mood      Treatment Provided   Treatment provided  Cognitive-Linquistic      Cognitive-Linquistic Treatment   Treatment focused on  Dysarthria    Skilled Treatment  With a structured sentence generation task, Pt requiring min verbal A to maintain "choppy" speech with aside comments. Note improved self-correction during structured task; independent with slow rate or self-correction in 18/20 sentences. SLP facilitated conversation and Pt required occasional non-verbal cues to self-correct short rushes of  speech; when provided non-verbal cue pt would repeat phrase or sentence at an appropriate rate.      Assessment / Recommendations / Plan   Plan  Continue with current plan of care      Progression Toward Goals   Progression toward goals  Progressing toward goals         SLP Short Term Goals - 11/18/17 1318      SLP SHORT TERM GOAL #1   Title  in sentence resposnes pt will achieve >90% intelligibility with self correction, over three sessions    Status  Partially Met   2/3 sessinos     SLP SHORT TERM GOAL #2   Title  in 5 minutes simple conversation, pt will achieve 90% intelligibility with nonverbal cues    Status  Achieved      SLP SHORT TERM GOAL #3   Title  pt and/or wife will report med administration with puree as more successful than with H2O    Status  Deferred   to [place] #4   Title  pt and/or wife will tell SLP of 3 overt s/s aspiration PNA    Time  1    Status  Deferred       SLP Long Term Goals - 11/22/17 1127  SLP LONG TERM GOAL #1   Title  pt will achieve 90% intelligibility in 10 minutes simple conversation with nonverbal cues, for three sessions    Time  4    Period  Weeks    Status  On-going      SLP LONG TERM GOAL #2   Title  in 5 minutes simple conversation pt will demo self correction for fast rate/unintelligible speech, to improve intelligiblity to 90%, over three sessions    Time  4    Period  Weeks    Status  On-going      SLP LONG TERM GOAL #3   Title  pt and/or wife will report med administration with puree as more successful than with H2O    Time  4    Period  Weeks    Status  On-going       Plan - 11/22/17 1127    Clinical Impression Statement  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 Pt had more difficulty today carrying over therapeutic behavior of slowed rate of speech to both structured tasks and in spontaneous conversation.  Pt  would cont to benefit from skilled ST focusing on pt's goal of improving communicative ability with family and in the community.    Speech Therapy Frequency  2x / week    Duration  --   8 weeks/17 sessions   Treatment/Interventions  Aspiration precaution training;SLP instruction and feedback;Compensatory strategies;Patient/family education;Internal/external aids;Multimodal communcation approach;Functional tasks;Cognitive reorganization;Oral motor exercises    Potential to Achieve Goals  Fair    Potential Considerations  Severity of impairments;Ability to learn/carryover information    Consulted and Agree with Plan of Care  Patient;Other (Comment)    Family Member Consulted  wife Jackelyn Poling       Patient will benefit from skilled therapeutic intervention in order to improve the following deficits and impairments:   Dysarthria and anarthria    Problem List Patient Active Problem List   Diagnosis Date Noted  . 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 Roddie Mc, CCC-SLP Speech Language Pathologist  Wende Bushy 11/22/2017, 11:48 AM  [facility] [address] [place], [place], [address] Phone: [phone]   Fax:  [phone]   Name: Patrick Ross MRN: [id number] Date of Birth: [birth year]

## 2017-11-22 NOTE — Patient Instructions (Signed)
Sit to Stand Transfers:  1. Scoot out to the edge of the chair 2. Place your feet flat on the floor, shoulder width apart.  Make sure your feet are tucked just under your knees. 3. Lean forward (nose over toes) with momentum, and stand up tall with your best posture.  If you need to use your arms, use them as a quick boost up to stand. 4. If you are in a low or soft chair, you can lean back and then forward up to stand, in order to get more momentum. 5. Once you are standing, make sure you are looking ahead and standing tall.  To sit down:  1. Back up until you feel the chair behind your legs. 2. Bend at you hips, reaching  Back for you chair, if needed, then slowly squat to sit down on your chair.      Sit on edge of chair, feet flat on floor with feet under your knees. Lean forward at hips with back straight, try not to use your arms/hands to push up. Stand upright, extending knees fully. SLOWLY lower back down to sitting, without using your arms.  Repeat ___5_ times per set. Do __1__ sets per session. Do __3__ sessions per day.  http://orth.exer.us/734   Copyright  VHI. All rights reserved.

## 2017-11-22 NOTE — Therapy (Signed)
[facility] [address] [place], [place], [address] Phone: [phone]   Fax:  [phone]  Occupational Therapy Treatment  Patient Details  Name: BREK REECE MRN: [id number] Date of Birth: Dec 19, 1945 Referring Provider: Dr. [facility] Guiles Tat   Encounter Date: 11/22/2017  OT End of Session - 11/22/17 0955    Visit Number  6    Number of Visits  17    Date for OT Re-Evaluation  12/24/17    Authorization Type  Medicare, Tricare for Life    Authorization Time Period  cert. period 10/25/17-01/23/18    Authorization - Visit Number  6    Authorization - Number of Visits  10    OT Start Time  [phone]    OT Stop Time  1015    OT Time Calculation (min)  41 min    Activity Tolerance  Patient tolerated treatment well    Behavior During Therapy  [facility] for tasks assessed/performed       Past Medical History:  Diagnosis Date  . GERD (gastroesophageal reflux disease)   . Hypertension   . Hypothyroidism   . Lumbar stenosis   . OSA (obstructive sleep apnea)   . Parkinson's disease ([place])   . REM behavioral disorder   . Urinary incontinence     Past Surgical History:  Procedure Laterality Date  . BACK SURGERY    . DEEP BRAIN STIMULATOR PLACEMENT     left 12/27/2012, right 12/10/2011, left revision 02/02/2013, replacement bilateral 11/21/2015  . KNEE SURGERY    . NEPHRECTOMY    . SPINAL FUSION    . TONSILLECTOMY      There were no vitals filed for this visit.                        OT Education - 11/22/17 1012    Education Details  coordination HEP, see pt instructions, PWR! up, rock and twist in supine, PWR! rock, modified quadraped min -mod v.c for positioning to protect back    Person(s) Educated  Patient;Spouse    Methods  Explanation;Demonstration;Verbal cues;Handout    Comprehension  Verbalized understanding;Returned demonstration;Verbal cues required       OT Short Term Goals - 11/22/17 0951       OT SHORT TERM GOAL #1   Title  Pt will be independent with updated PD-specific HEP.--check STGs 11/24/17    Status  On-going      OT SHORT TERM GOAL #2   Title  Pt will improve bilateral functional reaching/coordination for ADLs as shown by improving score on box and blocks test by at least 5 blocks bilaterally.    Status  On-going      OT SHORT TERM GOAL #3   Title  Pt will be able to write address and 2 sentences with good legibility and only min decr in size.    Status  On-going      OT SHORT TERM GOAL #4   Title  Pt will be able to tie both shoes mod I.    Status  Achieved        OT Long Term Goals - 10/25/17 1812      OT LONG TERM GOAL #1   Title  Pt will verbalize understanding of adaptive strategies for ADLs/IADLs to improve ease, movement quality, safety, and prevent future complications.--check LTGs 12/24/17    Time  8    Period  Weeks    Status  New  OT LONG TERM GOAL #2   Title  Pt will improve balance/functional reaching for IADLs as shown by reaching at least 10 inches with each UE without LOB.    Baseline  R-8", L-9" (with steps and close supervision)    Time  8    Period  Weeks    Status  New      OT LONG TERM GOAL #3   Title  Pt will be able to stand to complete grooming tasks at counter.    Time  8    Status  New      OT LONG TERM GOAL #4   Title  Pt will improve ability to dress as shown by fastening/unfastening 3 buttons in 46sec or less.    Baseline  56.50sec    Time  8    Period  Weeks    Status  New      OT LONG TERM GOAL #5   Title  Pt will improve dressing as shown by improving time on PPT#4 by at least 5sec with good balance/use of strategies.    Baseline  26.44sec    Time  8    Period  Weeks    Status  New      Long Term Additional Goals   Additional Long Term Goals  Yes      OT LONG TERM GOAL #6   Title  Pt will verbalize understanding of continuing fitness opportunities/appropriate community resources.    Baseline  --     Time  8    Period  Weeks    Status  New            Plan - 11/22/17 1001    Clinical Impression Statement  Pt is progressing towards goals. He demonstrates understanding of HEP, yet he can benefit from reinforcement.    Rehab Potential  Good    Current Impairments/barriers affecting progress:  hx of multiple back surgeries, severity of deficits    OT Frequency  2x / week    OT Duration  8 weeks    OT Treatment/Interventions  Self-care/ADL training;Cryotherapy;Paraffin;Therapeutic exercise;DME and/or AE instruction;Functional Mobility Training;Cognitive remediation/compensation;Balance training;Visual/perceptual remediation/compensation;Manual Therapy;Neuromuscular education;Fluidtherapy;Ultrasound;Moist Heat;Energy conservation;Passive range of motion;Therapeutic activities;Patient/family education    Plan  start checking goals    Consulted and Agree with Plan of Care  Patient    Family Member Consulted  wife       Patient will benefit from skilled therapeutic intervention in order to improve the following deficits and impairments:  Decreased balance, Decreased mobility, Decreased endurance, Difficulty walking, Impaired vision/preception, Pain, Improper body mechanics, Impaired tone, Impaired perceived functional ability, Decreased cognition, Decreased activity tolerance, Decreased coordination, Decreased knowledge of use of DME, Decreased safety awareness, Impaired UE functional use, Improper spinal/pelvic alignment  Visit Diagnosis: Other symptoms and signs involving the nervous system  Other symptoms and signs involving the musculoskeletal system  Other lack of coordination  Attention and concentration deficit    Problem List Patient Active Problem List   Diagnosis Date Noted  . 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 [place], [place], [address] Phone: [phone]   Fax:  [phone]  Name: WILKIE ZENON MRN: [id number] Date of Birth: [birth year]

## 2017-11-22 NOTE — Therapy (Signed)
[facility] [address] [place], [place], [address] Phone: [phone]   Fax:  [phone]  Physical Therapy Treatment  Patient Details  Name: Patrick Ross MRN: [id number] Date of Birth: May 21, 1945 Referring Provider: Dr. [facility] Guiles Tat   Encounter Date: 11/22/2017  PT End of Session - 11/22/17 2139    Visit Number  8    Number of Visits  18    Date for PT Re-Evaluation  01/23/18    Authorization Type  Medicare, Tricare for Life (Will need 10th visit progress notes)    PT Start Time  1017    PT Stop Time  1103    PT Time Calculation (min)  46 min    Equipment Utilized During Treatment  Gait belt    Activity Tolerance  Patient tolerated treatment well    Behavior During Therapy  [facility] for tasks assessed/performed       Past Medical History:  Diagnosis Date  . GERD (gastroesophageal reflux disease)   . Hypertension   . Hypothyroidism   . Lumbar stenosis   . OSA (obstructive sleep apnea)   . Parkinson's disease ([place])   . REM behavioral disorder   . Urinary incontinence     Past Surgical History:  Procedure Laterality Date  . BACK SURGERY    . DEEP BRAIN STIMULATOR PLACEMENT     left 12/27/2012, right 12/10/2011, left revision 02/02/2013, replacement bilateral 11/21/2015  . KNEE SURGERY    . NEPHRECTOMY    . SPINAL FUSION    . TONSILLECTOMY      There were no vitals filed for this visit.  Subjective Assessment - 11/22/17 1020    Subjective  Hurting today. Got steroid injection on Friday.     Patient is accompained by:  Family member    Pertinent History  PD diagnosed 2000, R STN DBS 11/2011, L STN DBS 11/2012 with revision 01/2013; lumbar fusion November 22, 2016, back surgeries, HTN, OSA; dystonia (Botox scheduled in September); receives injections in hips    Limitations  Walking;Standing    Patient Stated Goals  Pt's goals for physical therapy are to be able to go on camping trip in October (walk with  walking poles).    Currently in Pain?  Yes    Pain Score  4     Pain Location  Back    Pain Orientation  Lower    Pain Descriptors / Indicators  Aching    Pain Type  Chronic pain    Pain Onset  More than a month ago    Pain Frequency  Intermittent    Aggravating Factors   walking    Pain Relieving Factors  sitting/resting                       [facility] Adult PT Treatment/Exercise - 11/22/17 2127      Transfers   Transfers  Sit to Stand;Stand to Sit    Sit to Stand  4: Min guard;Without upper extremity assist    Stand to Sit  4: Min guard;Without upper extremity assist    Comments  vc for anterior wt-shift with sit to stand to sit      Ambulation/Gait   Ambulation/Gait Assistance  5: Supervision    Ambulation/Gait Assistance Details  vc for proximity to walker, relax shoulders with less pressure through UEs; rt step length too long    Ambulation Distance (Feet)  180 Feet   180, 120   Assistive device  --  U-step rolling walker   Gait Pattern  Step-through pattern;Decreased dorsiflexion - right;Decreased dorsiflexion - left;Scissoring;Narrow base of support;Decreased step length - left;Decreased weight shift to left;Decreased stance time - right    Ambulation Surface  Indoor        PWR [facility]) - 11/22/17 2133    Comments  Per OT issued modiffied quadruped Rock (only going to neutral spine, not to arched position)    Comments  Discussed with OT and issued handout for HEP to include supine Up, Rock, Twist      balance and core training- EO in corner, how to engage core to maintain shoulders over his pelvis with very light UE support    PT Education - 11/22/17 2137    Education Details  use of PWR supine UP, [place], and Twist in the morning to reduce stiffness in spine after night of sleeping; use of sit to stand for overall leg strengthening and balance    Person(s) Educated  Patient;Spouse    Methods  Explanation;Demonstration;Tactile cues;Verbal cues;Handout     Comprehension  Verbalized understanding;Returned demonstration;Verbal cues required;Tactile cues required;Need further instruction       PT Short Term Goals - 10/28/17 1642      PT SHORT TERM GOAL #1   Title  Pt will be independent with HEP to address balance, functional strengthening and gait.  TARGET:  11/25/17    Time  5    Period  Weeks    Status  New      PT SHORT TERM GOAL #2   Title  Pt will improve 5x sit<>stand score to less than or equal to 11.5 seconds (with UE support as needed), for improved transfer efficiency and safety.    Time  5    Period  Weeks    Status  New      PT SHORT TERM GOAL #3   Title  Pt will improve TUG score to less than or equal to 18 seconds for decreased fall risk.    Time  5    Period  Weeks    Status  New      PT SHORT TERM GOAL #4   Title  Pt will improve 6 MWT distance by 100 ft, with less than 2 points increase on pain scale for low back pain, for improved gait efficiency and safety.    Baseline  1063 ft in 6 MWT 10/27/17    Time  5    Period  Weeks    Status  New      PT SHORT TERM GOAL #5   Title  Berg Balance test to improve by at least 5 points, for decreased fall risk.    Baseline  Berg 25/56 10/27/17    Time  5    Period  Weeks    Status  New        PT Long Term Goals - 10/25/17 1215      PT LONG TERM GOAL #1   Title  Pt/family will verbalize understanding of fall prevention in home environment.  TARGET 12/23/17    Time  9    Period  Weeks    Status  New    Target Date  12/23/17      PT LONG TERM GOAL #2   Title  Pt will improve gait velocity to at least 2.5 ft/sec for improved gait efficiency and safety.    Time  9    Period  Weeks    Status  New  Target Date  12/23/17      PT LONG TERM GOAL #3   Title  Pt will ambulate at least 300 ft, indoor/outdoor surfaces, with bilateral walking poles, with supervision, for pt's goal to negotiate short hike for 72th birthday.    Time  9    Period  Weeks    Status  New     Target Date  12/23/17      PT LONG TERM GOAL #4   Title  Pt will improve TUG cognitive score to less than or equal to 18 seconds for decreased fall risk/improved dual tasking with gait.    Time  5    Period  Weeks    Status  New    Target Date  12/23/17      PT LONG TERM GOAL #5   Title  Pt/family will verbalize techniques to reduce festinating with gait and turns.    Time  5    Period  Weeks    Status  New    Target Date  12/23/17            Plan - 11/22/17 2140    Clinical Impression Statement  Initial portion of session addressed pt's questions re: "functional limb weakness" and cannot say for sure he does not have this (he had been researching it on the internet), but do feel his leg weakness is related to his back pain and lesser use of his legs for activity. Then focused on gait training and expanding HEP to address his morning pain/stiffness. Addressed core stability when working on corner balance exercises and LE strengthening. Wife very attentive throughout session.     Rehab Potential  Good    Clinical Impairments Affecting Rehab Potential  hx of multiple back surgeries, bilateral DBS, length of time since dx of Parkinson's; pt and wife are motivated for participation in therapy, pt active in community and exercise    PT Frequency  2x / week    PT Duration  Other (comment)   9 weeks   PT Treatment/Interventions  ADLs/Self Care Home Management;DME Instruction;Balance training;Therapeutic exercise;Therapeutic activities;Functional mobility training;Gait training;Neuromuscular re-education;Patient/family education    PT Next Visit Plan  chk STGs by 9/27; work on core stability (especially in upright), balance, and gait training with walking poles (try outdoors?)    Consulted and Agree with Plan of Care  Patient;Family member/caregiver    Family Member Consulted  wife       Patient will benefit from skilled therapeutic intervention in order to improve the following deficits  and impairments:  Abnormal gait, Decreased balance, Decreased mobility, Decreased strength, Difficulty walking, Postural dysfunction, Decreased safety awareness  Visit Diagnosis: Other symptoms and signs involving the nervous system  Abnormal posture  Other abnormalities of gait and mobility     Problem List Patient Active Problem List   Diagnosis Date Noted  . 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

## 2017-11-22 NOTE — Patient Instructions (Signed)
Coordination Exercises  Perform the following exercises for 20 minutes 1 times per day. Perform with both hand(s). Perform using big movements.   Flipping Cards: Place deck of cards on the table. Flip cards over by opening your hand big to grasp and then turn your palm up big.  Deal cards: Hold 1/2 or whole deck in your hand. Use thumb to push card off top of deck with one big push.  Rotate ball with fingertips: Pick up with fingers/thumb and move as much as you can with each turn/movement (clockwise and counter-clockwise).  Pick up coins and place in coin bank or container: Pick up with big, intentional movements. Do not drag coin to the edge.  Pick up coins and stack one at a time: Pick up with big, intentional movements. Do not drag coin to the edge. (5-10 in a stack)  Pick up 5-10 coins one at a time and hold in palm. Then, move coins from palm to fingertips one at time and place in coin bank/container.  Practice writing: Slow down, write big, and focus on forming each letter. 

## 2017-11-24 ENCOUNTER — Ambulatory Visit: Payer: Medicare Other | Admitting: Occupational Therapy

## 2017-11-24 ENCOUNTER — Ambulatory Visit: Payer: Medicare Other | Admitting: Speech Pathology

## 2017-11-24 ENCOUNTER — Encounter: Payer: Self-pay | Admitting: Occupational Therapy

## 2017-11-24 ENCOUNTER — Ambulatory Visit: Payer: Medicare Other | Admitting: Physical Therapy

## 2017-11-24 ENCOUNTER — Encounter: Payer: Self-pay | Admitting: Physical Therapy

## 2017-11-24 VITALS — BP 106/67 | HR 93

## 2017-11-24 DIAGNOSIS — M6281 Muscle weakness (generalized): Secondary | ICD-10-CM

## 2017-11-24 DIAGNOSIS — R293 Abnormal posture: Secondary | ICD-10-CM

## 2017-11-24 DIAGNOSIS — R41844 Frontal lobe and executive function deficit: Secondary | ICD-10-CM

## 2017-11-24 DIAGNOSIS — R29818 Other symptoms and signs involving the nervous system: Secondary | ICD-10-CM

## 2017-11-24 DIAGNOSIS — R2681 Unsteadiness on feet: Secondary | ICD-10-CM

## 2017-11-24 DIAGNOSIS — R278 Other lack of coordination: Secondary | ICD-10-CM

## 2017-11-24 DIAGNOSIS — R2689 Other abnormalities of gait and mobility: Secondary | ICD-10-CM

## 2017-11-24 DIAGNOSIS — R29898 Other symptoms and signs involving the musculoskeletal system: Secondary | ICD-10-CM

## 2017-11-24 DIAGNOSIS — R471 Dysarthria and anarthria: Secondary | ICD-10-CM

## 2017-11-24 DIAGNOSIS — R41840 Attention and concentration deficit: Secondary | ICD-10-CM

## 2017-11-24 NOTE — Therapy (Signed)
[facility] [address] [place], [place], [address] Phone: [phone]   Fax:  [phone]  Speech Language Pathology Treatment  Patient Details  Name: Patrick Ross MRN: [id number] Date of Birth: September 12, 1945 Referring Provider: Alonza Bogus   Encounter Date: 11/24/2017  End of Session - 11/24/17 1038    Visit Number  10    Number of Visits  17    Date for SLP Re-Evaluation  01/20/18    SLP Start Time  7353    SLP Stop Time   1101    SLP Time Calculation (min)  46 min    Activity Tolerance  Patient tolerated treatment well       Past Medical History:  Diagnosis Date  . GERD (gastroesophageal reflux disease)   . Hypertension   . Hypothyroidism   . Lumbar stenosis   . OSA (obstructive sleep apnea)   . Parkinson's disease ([place])   . REM behavioral disorder   . Urinary incontinence     Past Surgical History:  Procedure Laterality Date  . BACK SURGERY    . DEEP BRAIN STIMULATOR PLACEMENT     left 12/27/2012, right 12/10/2011, left revision 02/02/2013, replacement bilateral 11/21/2015  . KNEE SURGERY    . NEPHRECTOMY    . SPINAL FUSION    . TONSILLECTOMY      There were no vitals filed for this visit.  Subjective Assessment - 11/24/17 1020    Subjective  Pt enters with rapid rate of speech; slows rate when sitting.    Patient is accompained by:  Family member    Currently in Pain?  Yes    Pain Score  3     Pain Location  Hip    Pain Orientation  Right            ADULT SLP TREATMENT - 11/24/17 1029      General Information   Behavior/Cognition  Alert;Cooperative;Pleasant mood      Treatment Provided   Treatment provided  Cognitive-Linquistic      Cognitive-Linquistic Treatment   Treatment focused on  Dysarthria    Skilled Treatment  Pt greeted SLP with faster rate than last session; Pt reports multi-tasking even walking and talking makes it difficult to remember to use "choppy speech". SLP  facilitated conversation with familiar topics and patient required rare non-verbal cues to repeat phrases and/or sentences at an appropriate rate. 3 brief 5-72mnute conversations, Pt demonstrated self-correction of rapid bursts of speech 60% of the time and was 90% intelligible during all conversations. Continue to note improved awareness and self correction.      Assessment / Recommendations / Plan   Plan  Continue with current plan of care      Progression Toward Goals   Progression toward goals  Progressing toward goals       SLP Education - 11/24/17 1036    Education Details  use "choppy speech", target self correction of speech rate at home    Person(s) Educated  Patient;Spouse    Methods  Explanation;Demonstration    Comprehension  Verbalized understanding;Returned demonstration       SLP Short Term Goals - 11/24/17 1039      SLP SHORT TERM GOAL #1   Title  in sentence resposnes pt will achieve >90% intelligibility with self correction, over three sessions    Status  Partially Met   2/3 sessinos     SLP SHORT TERM GOAL #2   Title  in 5 minutes simple  conversation, pt will achieve 90% intelligibility with nonverbal cues    Status  Achieved      SLP SHORT TERM GOAL #3   Title  pt and/or wife will report med administration with puree as more successful than with H2O    Status  Deferred   to [place] #4   Title  pt and/or wife will tell SLP of 3 overt s/s aspiration PNA    Time  1    Status  Deferred       SLP Long Term Goals - 11/24/17 1039      SLP LONG TERM GOAL #1   Title  pt will achieve 90% intelligibility in 10 minutes simple conversation with nonverbal cues, for three sessions    Time  4    Period  Weeks    Status  On-going      SLP LONG TERM GOAL #2   Title  in 5 minutes simple conversation pt will demo self correction for fast rate/unintelligible speech, to improve intelligiblity to 90%, over three sessions    Time  4    Period   Weeks    Status  On-going      SLP LONG TERM GOAL #3   Title  pt and/or wife will report med administration with puree as more successful than with H2O    Time  4    Period  Weeks    Status  On-going       Plan - 11/24/17 1038    Clinical Impression Statement  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 Pt had more difficulty today carrying over therapeutic behavior of slowed rate of speech during initial greeting but then began self correcting during conversation. When provided non-verbal cue pt will repeat phrase or sentence at an appropriate rate; continue targeting increasing self-correction.Pt would cont to benefit from skilled ST focusing on pt's goal of improving communicative ability with family and in the community.     Speech Therapy Frequency  2x / week    Duration  --   8 weeks/17 sessions   Treatment/Interventions  Aspiration precaution training;SLP instruction and feedback;Compensatory strategies;Patient/family education;Internal/external aids;Multimodal communcation approach;Functional tasks;Cognitive reorganization;Oral motor exercises    Potential to Achieve Goals  Fair    Potential Considerations  Severity of impairments;Ability to learn/carryover information    Consulted and Agree with Plan of Care  Patient;Other (Comment)    Family Member Consulted  wife Jackelyn Poling       Patient will benefit from skilled therapeutic intervention in order to improve the following deficits and impairments:   Dysarthria and anarthria    Problem List Patient Active Problem List   Diagnosis Date Noted  . 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 Roddie Mc, CCC-SLP Speech Language Pathologist  Wende Bushy 11/24/2017, 11:06 AM  [facility] [address] [place], [place], [address] Phone: [phone]   Fax:  [phone]   Name: RAYKWON HOBBS MRN: [id number] Date of Birth: 02-23-1946

## 2017-11-24 NOTE — Therapy (Signed)
[facility] [address] [place], [place], [address] Phone: [phone]   Fax:  [phone]  Occupational Therapy Treatment  Patient Details  Name: Patrick Ross MRN: [id number] Date of Birth: 26-Mar-1945 No data recorded  Encounter Date: 11/24/2017  OT End of Session - 11/24/17 1326    Visit Number  7    Number of Visits  17    Date for OT Re-Evaluation  12/24/17    Authorization Type  Medicare, Tricare for Life    Authorization Time Period  cert. period 10/25/17-01/23/18    Authorization - Visit Number  7    Authorization - Number of Visits  10    OT Start Time  7371    OT Stop Time  1400    OT Time Calculation (min)  43 min    Activity Tolerance  Patient tolerated treatment well    Behavior During Therapy  WFL for tasks assessed/performed       Past Medical History:  Diagnosis Date  . GERD (gastroesophageal reflux disease)   . Hypertension   . Hypothyroidism   . Lumbar stenosis   . OSA (obstructive sleep apnea)   . Parkinson's disease ([place])   . REM behavioral disorder   . Urinary incontinence     Past Surgical History:  Procedure Laterality Date  . BACK SURGERY    . DEEP BRAIN STIMULATOR PLACEMENT     left 12/27/2012, right 12/10/2011, left revision 02/02/2013, replacement bilateral 11/21/2015  . KNEE SURGERY    . NEPHRECTOMY    . SPINAL FUSION    . TONSILLECTOMY      Vitals:   11/24/17 1319  BP: 106/67  Pulse: 93    Subjective Assessment - 11/24/17 1319    Subjective   just tired    Patient is accompained by:  Family member    Pertinent History  GERD, HTN, hypothyroidism, lumbar stenosis, OSA, REM behavioral disorder, Urinary incontinence, hx of multiple back surgeries (last 2018 lumbar fusion), s/p bilateral DBS (2013,2014,+revision 2014)     Limitations  fall risk, bilateral deep brain stimulator (DBS), hx of multiple spinal surgeries     Patient Stated Goals  be able to tie both shoes  consistently, be able to go on a hike in October     Currently in Pain?  Yes    Pain Score  4     Pain Location  Back    Pain Orientation  Right    Pain Descriptors / Indicators  Aching    Pain Type  Chronic pain    Pain Onset  More than a month ago    Pain Frequency  Intermittent    Aggravating Factors   walking    Pain Relieving Factors  sitting/resting          Practiced writing pt-generated sentences and signature with min cueing to slow down and write big.  Pt with good legibility when he slows down.  Recommended pt practice writing some at home in functional contexts (lists, calendar, phone message).  Recommended pt think about what he wants to write before starting to write and practice on graph paper/crosswords (as pt reports difficulty filling out forms).  Also recommended pt fill in available space on forms.  Pt verbalized understanding.                   OT Education - 11/24/17 1349    Education Details  Reviewed coordination HEP--pt returned demo each with min cueing  for large amplitude and decr compensation.  Writing strategies (slow down, write big, practice)    Person(s) Educated  Patient;Spouse    Methods  Explanation;Demonstration;Verbal cues    Comprehension  Verbalized understanding;Returned demonstration;Verbal cues required       OT Short Term Goals - 11/24/17 1348      OT SHORT TERM GOAL #1   Title  Pt will be independent with updated PD-specific HEP.--check STGs 11/24/17    Status  On-going   11/24/17:  will benefit from reinforcement     OT SHORT TERM GOAL #2   Title  Pt will improve bilateral functional reaching/coordination for ADLs as shown by improving score on box and blocks test by at least 5 blocks bilaterally.    Status  On-going      OT SHORT TERM GOAL #3   Title  Pt will be able to write address and 2 sentences with good legibility and only min decr in size.    Status  On-going      OT SHORT TERM GOAL #4   Title  Pt will be  able to tie both shoes mod I.    Status  Achieved        OT Long Term Goals - 10/25/17 1812      OT LONG TERM GOAL #1   Title  Pt will verbalize understanding of adaptive strategies for ADLs/IADLs to improve ease, movement quality, safety, and prevent future complications.--check LTGs 12/24/17    Time  8    Period  Weeks    Status  New      OT LONG TERM GOAL #2   Title  Pt will improve balance/functional reaching for IADLs as shown by reaching at least 10 inches with each UE without LOB.    Baseline  R-8", L-9" (with steps and close supervision)    Time  8    Period  Weeks    Status  New      OT LONG TERM GOAL #3   Title  Pt will be able to stand to complete grooming tasks at counter.    Time  8    Status  New      OT LONG TERM GOAL #4   Title  Pt will improve ability to dress as shown by fastening/unfastening 3 buttons in 46sec or less.    Baseline  56.50sec    Time  8    Period  Weeks    Status  New      OT LONG TERM GOAL #5   Title  Pt will improve dressing as shown by improving time on PPT#4 by at least 5sec with good balance/use of strategies.    Baseline  26.44sec    Time  8    Period  Weeks    Status  New      Long Term Additional Goals   Additional Long Term Goals  Yes      OT LONG TERM GOAL #6   Title  Pt will verbalize understanding of continuing fitness opportunities/appropriate community resources.    Baseline  --    Time  8    Period  Weeks    Status  New            Plan - 11/24/17 1329    Clinical Impression Statement  Pt is progressing towards goals. He demonstrates understanding of HEP, yet he can benefit from reinforcement.    Rehab Potential  Good    Current Impairments/barriers affecting progress:  hx of multiple back surgeries, severity of deficits    OT Frequency  2x / week    OT Duration  8 weeks    OT Treatment/Interventions  Self-care/ADL training;Cryotherapy;Paraffin;Therapeutic exercise;DME and/or AE instruction;Functional  Mobility Training;Cognitive remediation/compensation;Balance training;Visual/perceptual remediation/compensation;Manual Therapy;Neuromuscular education;Fluidtherapy;Ultrasound;Moist Heat;Energy conservation;Passive range of motion;Therapeutic activities;Patient/family education    Plan  continue with writing, check box and blocks, review PWR! HEP    OT Home Exercise Plan  Education provided:  coordination HEP, writing strategies    Recommended Other Services  current with PT, ST    Consulted and Agree with Plan of Care  Patient    Family Member Consulted  wife       Patient will benefit from skilled therapeutic intervention in order to improve the following deficits and impairments:  Decreased balance, Decreased mobility, Decreased endurance, Difficulty walking, Impaired vision/preception, Pain, Improper body mechanics, Impaired tone, Impaired perceived functional ability, Decreased cognition, Decreased activity tolerance, Decreased coordination, Decreased knowledge of use of DME, Decreased safety awareness, Impaired UE functional use, Improper spinal/pelvic alignment  Visit Diagnosis: Other symptoms and signs involving the nervous system  Other symptoms and signs involving the musculoskeletal system  Other lack of coordination  Abnormal posture  Attention and concentration deficit  Other abnormalities of gait and mobility  Unsteadiness on feet  Frontal lobe and executive function deficit  Muscle weakness (generalized)    Problem List Patient Active Problem List   Diagnosis Date Noted  . 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 [place], [place], [address] Phone: [phone]   Fax:  [phone]  Name: CHA GOMILLION MRN: [id number] Date of Birth: 02-25-1946   Vianne Bulls, OTR/L [facility] [address] [place], [place]   [address] [phone] phone [phone] 11/24/17 3:08 PM

## 2017-11-25 NOTE — Therapy (Signed)
[facility] [address] [place], [place], [address] Phone: [phone]   Fax:  [phone]  Physical Therapy Treatment  Patient Details  Name: Patrick Ross MRN: [id number] Date of Birth: 06/11/1945 Referring Provider (PT): Dr. Wells Guiles Tat   Encounter Date: 11/24/2017  PT End of Session - 11/25/17 0917    Visit Number  9    Number of Visits  18    Date for PT Re-Evaluation  01/23/18    Authorization Type  Medicare, Tricare for Life (Will need 10th visit progress notes)    PT Start Time  1100    PT Stop Time  1143    PT Time Calculation (min)  43 min    Equipment Utilized During Treatment  Gait belt    Activity Tolerance  Patient tolerated treatment well    Behavior During Therapy  [facility] for tasks assessed/performed       Past Medical History:  Diagnosis Date  . GERD (gastroesophageal reflux disease)   . Hypertension   . Hypothyroidism   . Lumbar stenosis   . OSA (obstructive sleep apnea)   . Parkinson's disease ([place])   . REM behavioral disorder   . Urinary incontinence     Past Surgical History:  Procedure Laterality Date  . BACK SURGERY    . DEEP BRAIN STIMULATOR PLACEMENT     left 12/27/2012, right 12/10/2011, left revision 02/02/2013, replacement bilateral 11/21/2015  . KNEE SURGERY    . NEPHRECTOMY    . SPINAL FUSION    . TONSILLECTOMY      There were no vitals filed for this visit.  Subjective Assessment - 11/24/17 1102    Subjective  Pain was better over the weekend, but then started hurting again.  Got injection last Friday, and felt better (no pain) on Saturday, went to the mountains and took a hike (0.12 miles) with friend.  Pain is back.    Patient is accompained by:  Family member    Pertinent History  PD diagnosed 2000, R STN DBS 11/2011, L STN DBS 11/2012 with revision 01/2013; lumbar fusion November 22, 2016, back surgeries, HTN, OSA; dystonia (Botox scheduled in September); receives  injections in hips    Limitations  Walking;Standing    Patient Stated Goals  Pt's goals for physical therapy are to be able to go on camping trip in October (walk with walking poles).    Currently in Pain?  Yes    Pain Score  3     Pain Location  Hip    Pain Orientation  Right    Pain Descriptors / Indicators  Aching    Pain Type  Chronic pain    Pain Onset  More than a month ago    Pain Frequency  Intermittent    Aggravating Factors   walking    Pain Relieving Factors  sitting/resting                       [facility] Adult PT Treatment/Exercise - 11/25/17 0901      Transfers   Transfers  Sit to Stand;Stand to Sit    Sit to Stand  4: Min guard;With upper extremity assist;With armrests;From chair/3-in-1    Five time sit to stand comments   14.22    Stand to Sit  4: Min guard;With upper extremity assist;With armrests;To chair/3-in-1      Ambulation/Gait   Ambulation/Gait  Yes    Ambulation/Gait Assistance  5: Supervision  Ambulation Distance (Feet)  40 Feet   x 2   Assistive device  --   U-step RW   Gait Pattern  Step-through pattern;Decreased dorsiflexion - right;Decreased dorsiflexion - left;Scissoring;Narrow base of support;Decreased step length - left;Decreased weight shift to left;Decreased stance time - right    Ambulation Surface  Level;Indoor    Pre-Gait Activities  3 minute walk:  Attempted 6 minute walk, but unable due to patient's c/o weakness; 367 ft in 3 minute walk test      Standardized Balance Assessment   Standardized Balance Assessment  Berg Balance Test;Timed Up and Go Test   Unable to complete due to leg weakness     Berg Balance Test   Sit to Stand  Able to stand  independently using hands    Standing Unsupported  Able to stand 30 seconds unsupported    Sitting with Back Unsupported but Feet Supported on Floor or Stool  Able to sit safely and securely 2 minutes    Stand to Sit  Controls descent by using hands    Transfers  Needs one person to  assist    Standing Unsupported with Eyes Closed  Able to stand 10 seconds with supervision    Standing Ubsupported with Feet Together  Needs help to attain position but able to stand for 30 seconds with feet together      Timed Up and Go Test   TUG  Normal TUG    Normal TUG (seconds)  18.44   18.47, 17.59     Self-Care   Self-Care  Other Self-Care Comments    Other Self-Care Comments   Pt began session by presenting printed information from websites on functional lower extremity weakness, functional movement disorder, as he feels it describes a lot of his symptoms.  Discussed that while diagnosis is helpful especially to patient, PT treats functional deficits, in relation to functional mobility and independence.  Discussed patient's leg weakness limiting standing and gait activities in therapy session, and potential need to speak to MD regarding leg weakness, as it could be coming from his back.  Discussed progress towards goals and plans to conitnue to address balance and gait towards LTGs.       Self Care:  Also discussed activity tolerance and monitoring activities to avoid overdoing things so much on "good" days that he cannot function as well next several days.  Explained trying to perform activities when he is better in medication cycle or when he has less pain, to modulate activity so he is more even with what he can tolerate day to day.      PT Education - 11/25/17 0915    Education Details  POC, progress towards goals, pain and lower extremity weakness concerning, and if these symptoms are more frequent and worsening, he needs to let MD know    Person(s) Educated  Patient;Spouse    Methods  Explanation    Comprehension  Verbalized understanding       PT Short Term Goals - 11/25/17 0918      PT SHORT TERM GOAL #1   Title  Pt will be independent with HEP to address balance, functional strengthening and gait.  TARGET:  11/25/17    Time  5    Period  Weeks    Status  New       PT SHORT TERM GOAL #2   Title  Pt will improve 5x sit<>stand score to less than or equal to 11.5 seconds (with UE support as needed),  for improved transfer efficiency and safety.    Baseline  14.22 sec 11/24/17    Time  5    Period  Weeks    Status  Not Met      PT SHORT TERM GOAL #3   Title  Pt will improve TUG score to less than or equal to 18 seconds for decreased fall risk.    Time  5    Period  Weeks    Status  Achieved      PT SHORT TERM GOAL #4   Title  Pt will improve 6 MWT distance by 100 ft, with less than 2 points increase on pain scale for low back pain, for improved gait efficiency and safety.    Baseline  1063 ft in 6 MWT 10/27/17; unable to complete 6MWT, 3 minute walk 367 ft    Time  5    Period  Weeks    Status  Not Met      PT SHORT TERM GOAL #5   Title  Berg Balance test to improve by at least 5 points, for decreased fall risk.    Baseline  Berg 25/56 10/27/17    Time  5    Period  Weeks    Status  New        PT Long Term Goals - 10/25/17 1215      PT LONG TERM GOAL #1   Title  Pt/family will verbalize understanding of fall prevention in home environment.  TARGET 12/23/17    Time  9    Period  Weeks    Status  New    Target Date  12/23/17      PT LONG TERM GOAL #2   Title  Pt will improve gait velocity to at least 2.5 ft/sec for improved gait efficiency and safety.    Time  9    Period  Weeks    Status  New    Target Date  12/23/17      PT LONG TERM GOAL #3   Title  Pt will ambulate at least 300 ft, indoor/outdoor surfaces, with bilateral walking poles, with supervision, for pt's goal to negotiate short hike for 72th birthday.    Time  9    Period  Weeks    Status  New    Target Date  12/23/17      PT LONG TERM GOAL #4   Title  Pt will improve TUG cognitive score to less than or equal to 18 seconds for decreased fall risk/improved dual tasking with gait.    Time  5    Period  Weeks    Status  New    Target Date  12/23/17      PT LONG TERM  GOAL #5   Title  Pt/family will verbalize techniques to reduce festinating with gait and turns.    Time  5    Period  Weeks    Status  New    Target Date  12/23/17            Plan - 11/25/17 0917    Clinical Impression Statement  Assessed STGs, with STG 2 and 4 not met, but STG 3 met.  Unable to complete 6MWT due to pt's c/o leg weakness and fatigue and unable to complete Berg Balance test due to leg weakness and fatigue.  Pt's PT sessions are quite limited due to pain some days and lower extremity weakness where patient feels his leg may give way and  fall.  However, pt reports on off-therapy days that he has taken trip to [place] for a 0.12 mile hike and packed up camping gear in his front yard getting up and down from ground multiple times.  Discussed pt's activity level and trying to modulate so he does not overdo activities to increase pain and weakness where he cannot function as well the next few days.  Will need to finish assessing STGs.    Rehab Potential  Good    Clinical Impairments Affecting Rehab Potential  hx of multiple back surgeries, bilateral DBS, length of time since dx of Parkinson's; pt and wife are motivated for participation in therapy, pt active in community and exercise    PT Frequency  2x / week    PT Duration  Other (comment)   9 weeks   PT Treatment/Interventions  ADLs/Self Care Home Management;DME Instruction;Balance training;Therapeutic exercise;Therapeutic activities;Functional mobility training;Gait training;Neuromuscular re-education;Patient/family education    PT Next Visit Plan  Finish checking STGs; work on core stability (especially in upright), balance, and gait training with walking poles (try outdoors?)    Consulted and Agree with Plan of Care  Patient;Family member/caregiver    Family Member Consulted  wife     PLAN:  Will need 10th visit progress note next visit.  Patient will benefit from skilled therapeutic intervention in order to improve the  following deficits and impairments:  Abnormal gait, Decreased balance, Decreased mobility, Decreased strength, Difficulty walking, Postural dysfunction, Decreased safety awareness  Visit Diagnosis: Other abnormalities of gait and mobility  Abnormal posture  Other symptoms and signs involving the nervous system     Problem List Patient Active Problem List   Diagnosis Date Noted  . 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 11/25/2017, 9:24 AM  Frazier Butt., PT   [facility] [address] [place], [place], [address] Phone: [phone]   Fax:  [phone]  Name: Patrick Ross MRN: [id number] Date of Birth: 08/16/1945

## 2017-11-29 ENCOUNTER — Ambulatory Visit: Payer: Medicare Other | Attending: Neurology | Admitting: Physical Therapy

## 2017-11-29 ENCOUNTER — Ambulatory Visit: Payer: Medicare Other

## 2017-11-29 ENCOUNTER — Ambulatory Visit: Payer: Medicare Other | Admitting: Occupational Therapy

## 2017-11-29 ENCOUNTER — Encounter: Payer: Self-pay | Admitting: Physical Therapy

## 2017-11-29 ENCOUNTER — Encounter: Payer: Self-pay | Admitting: Occupational Therapy

## 2017-11-29 DIAGNOSIS — M6281 Muscle weakness (generalized): Secondary | ICD-10-CM | POA: Insufficient documentation

## 2017-11-29 DIAGNOSIS — R2681 Unsteadiness on feet: Secondary | ICD-10-CM

## 2017-11-29 DIAGNOSIS — R41844 Frontal lobe and executive function deficit: Secondary | ICD-10-CM | POA: Diagnosis present

## 2017-11-29 DIAGNOSIS — R29898 Other symptoms and signs involving the musculoskeletal system: Secondary | ICD-10-CM

## 2017-11-29 DIAGNOSIS — R278 Other lack of coordination: Secondary | ICD-10-CM | POA: Insufficient documentation

## 2017-11-29 DIAGNOSIS — R41841 Cognitive communication deficit: Secondary | ICD-10-CM | POA: Insufficient documentation

## 2017-11-29 DIAGNOSIS — R471 Dysarthria and anarthria: Secondary | ICD-10-CM | POA: Insufficient documentation

## 2017-11-29 DIAGNOSIS — R29818 Other symptoms and signs involving the nervous system: Secondary | ICD-10-CM | POA: Diagnosis present

## 2017-11-29 DIAGNOSIS — R293 Abnormal posture: Secondary | ICD-10-CM | POA: Diagnosis present

## 2017-11-29 DIAGNOSIS — R2689 Other abnormalities of gait and mobility: Secondary | ICD-10-CM | POA: Diagnosis present

## 2017-11-29 DIAGNOSIS — R41840 Attention and concentration deficit: Secondary | ICD-10-CM

## 2017-11-29 NOTE — Therapy (Signed)
[facility] [address] [place], [place], [address] Phone: [phone]   Fax:  [phone]  Occupational Therapy Treatment  Patient Details  Name: Patrick Ross MRN: [id number] Date of Birth: Nov 10, 1945 No data recorded  Encounter Date: 11/29/2017  OT End of Session - 11/29/17 1820    Visit Number  8    Number of Visits  17    Date for OT Re-Evaluation  12/24/17    Authorization Type  Medicare, [employer]    Authorization Time Period  cert. period 10/25/17-01/23/18    Authorization - Visit Number  8    Authorization - Number of Visits  10    OT Start Time  1154    OT Stop Time  1233    OT Time Calculation (min)  39 min    Activity Tolerance  Patient tolerated treatment well    Behavior During Therapy  WFL for tasks assessed/performed       Past Medical History:  Diagnosis Date  . GERD (gastroesophageal reflux disease)   . Hypertension   . Hypothyroidism   . Lumbar stenosis   . OSA (obstructive sleep apnea)   . Parkinson's disease ([place])   . REM behavioral disorder   . Urinary incontinence     Past Surgical History:  Procedure Laterality Date  . BACK SURGERY    . DEEP BRAIN STIMULATOR PLACEMENT     left 12/27/2012, right 12/10/2011, left revision 02/02/2013, replacement bilateral 11/21/2015  . KNEE SURGERY    . NEPHRECTOMY    . SPINAL FUSION    . TONSILLECTOMY      There were no vitals filed for this visit.  Subjective Assessment - 11/29/17 1818    Subjective   I'm about a 4-6/10 on my "loopy" scale    Patient is accompained by:  Family member    Pertinent History  GERD, HTN, hypothyroidism, lumbar stenosis, OSA, REM behavioral disorder, Urinary incontinence, hx of multiple back surgeries (last 2018 lumbar fusion), s/p bilateral DBS ([id number],+YFVCBSWH 2014)     Limitations  fall risk, bilateral deep brain stimulator (DBS), hx of multiple spinal surgeries     Patient Stated Goals  be able to  tie both shoes consistently, be able to go on a hike in October     Currently in Pain?  Yes    Pain Score  3     Pain Location  Hip    Pain Orientation  Right    Pain Descriptors / Indicators  Aching    Pain Type  Chronic pain    Pain Onset  More than a month ago    Aggravating Factors   walking    Pain Relieving Factors  sitting/resting      Pt reports that his leg gets "weak" with turning and doesn't know why he can use bike for a long time if it is so weak.  Discussed that many times feelings of weakness/unsteadiness with PD can be related to bradykinesia and movement patterns such as crossing feet in small pattern with turning.  Practiced turning with U-step walker with focus on avoiding crossing feet and stepping out to turn with same foot as direction.  Also discussed narrow gait pattern with ambulation to incr awareness and cued pt to slow down.    Also discussed possible causes of "loopy" feeling with PD.  (BP changes, visual changes, how DBS can affect movement and timing of movements, hypokinesia)  Pt reports "fuzziness" with vision when he  turns head/looks around.  This appears to happen when pt looks to the right (past midline) and clears in peripheral vision to the right.  Discussed vision changes that can happen with PD and that if eye movements are not coordinated or one eye is slower with bradykinesia it can cause blurriness and/or diplopia.  Instructed pt in basic eye HEP--see pt instructions.  Discussed rationale and recommended pt also resume PT eye exercises.  (pt has not been performing because he didn't think it would help).  Tossing ball in R hand and tossing ball between hands for incr coordination.  Pt cued for incr head movements and R shoulder compensation patterns as well as wrist position.  Dealing cards with thumb with each hand with min cueing for wrist position and large amplitude.  Discussed/education provided on purpose of therapy activities.       OT Short  Term Goals - 11/29/17 1823      OT SHORT TERM GOAL #1   Title  Pt will be independent with updated PD-specific HEP.--check STGs 11/24/17    Status  On-going   11/24/17:  will benefit from reinforcement     OT SHORT TERM GOAL #2   Title  Pt will improve bilateral functional reaching/coordination for ADLs as shown by improving score on box and blocks test by at least 5 blocks bilaterally.    Status  On-going      OT SHORT TERM GOAL #3   Title  Pt will be able to write address and 2 sentences with good legibility and only min decr in size.    Status  On-going      OT SHORT TERM GOAL #4   Title  Pt will be able to tie both shoes mod I.    Status  Achieved        OT Long Term Goals - 10/25/17 1812      OT LONG TERM GOAL #1   Title  Pt will verbalize understanding of adaptive strategies for ADLs/IADLs to improve ease, movement quality, safety, and prevent future complications.--check LTGs 12/24/17    Time  8    Period  Weeks    Status  New      OT LONG TERM GOAL #2   Title  Pt will improve balance/functional reaching for IADLs as shown by reaching at least 10 inches with each UE without LOB.    Baseline  R-8", L-9" (with steps and close supervision)    Time  8    Period  Weeks    Status  New      OT LONG TERM GOAL #3   Title  Pt will be able to stand to complete grooming tasks at counter.    Time  8    Status  New      OT LONG TERM GOAL #4   Title  Pt will improve ability to dress as shown by fastening/unfastening 3 buttons in 46sec or less.    Baseline  56.50sec    Time  8    Period  Weeks    Status  New      OT LONG TERM GOAL #5   Title  Pt will improve dressing as shown by improving time on PPT#4 by at least 5sec with good balance/use of strategies.    Baseline  26.44sec    Time  8    Period  Weeks    Status  New      Long Term Additional Goals   Additional  Long Term Goals  Yes      OT LONG TERM GOAL #6   Title  Pt will verbalize understanding of continuing  fitness opportunities/appropriate community resources.    Baseline  --    Time  8    Period  Weeks    Status  New            Plan - 11/29/17 1821    Clinical Impression Statement  Pt is progressing towards goals, but is not consistent with carryover of HEP.  He is starting to try to use a schedule.    Rehab Potential  Good    Current Impairments/barriers affecting progress:  hx of multiple back surgeries, severity of deficits    OT Frequency  2x / week    OT Duration  8 weeks    OT Treatment/Interventions  Self-care/ADL training;Cryotherapy;Paraffin;Therapeutic exercise;DME and/or AE instruction;Functional Mobility Training;Cognitive remediation/compensation;Balance training;Visual/perceptual remediation/compensation;Manual Therapy;Neuromuscular education;Fluidtherapy;Ultrasound;Moist Heat;Energy conservation;Passive range of motion;Therapeutic activities;Patient/family education    Plan  continue with writing, check box and blocks, review PWR! HEP    OT Home Exercise Plan  Education provided:  coordination HEP, writing strategies    Recommended Other Services  current with PT, ST    Consulted and Agree with Plan of Care  Patient    Family Member Consulted  wife       Patient will benefit from skilled therapeutic intervention in order to improve the following deficits and impairments:  Decreased balance, Decreased mobility, Decreased endurance, Difficulty walking, Impaired vision/preception, Pain, Improper body mechanics, Impaired tone, Impaired perceived functional ability, Decreased cognition, Decreased activity tolerance, Decreased coordination, Decreased knowledge of use of DME, Decreased safety awareness, Impaired UE functional use, Improper spinal/pelvic alignment  Visit Diagnosis: Other symptoms and signs involving the nervous system  Other symptoms and signs involving the musculoskeletal system  Other lack of coordination  Abnormal posture  Attention and concentration  deficit  Other abnormalities of gait and mobility  Unsteadiness on feet  Frontal lobe and executive function deficit  Muscle weakness (generalized)    Problem List Patient Active Problem List   Diagnosis Date Noted  . 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 [place], [place]  [address] [phone] phone [phone] 11/29/17 6:25 PM

## 2017-11-29 NOTE — Patient Instructions (Signed)
Visual HEP:  Perform at least 1 times per day. Stop if your eye becomes fatigued or hurts and try again later.  1. Hold a small object/card in front of you.  Hold it in the middle at arm's length away.    2. Remember to keep your head still and only move your eyes.  4. Then, move object from side to side down slowly while watching it 5-10 times.  6.  If image starts to double/blur, stop and try to make image one if possible before continuing.  7.   Repeat 3-5 times.

## 2017-11-29 NOTE — Therapy (Signed)
[facility] [address] [place], [place], [address] Phone: [phone]   Fax:  [phone]  Speech Language Pathology Treatment  Patient Details  Name: Patrick Ross MRN: [id number] Date of Birth: November 09, 1945 Referring Provider (SLP): Tat, Josephina Shih Date: 11/29/2017  End of Session - 11/29/17 1306    Visit Number  11    Number of Visits  17    Date for SLP Re-Evaluation  01/20/18    SLP Start Time  1150    SLP Stop Time   2992    SLP Time Calculation (min)  46 min    Activity Tolerance  Patient tolerated treatment well       Past Medical History:  Diagnosis Date  . GERD (gastroesophageal reflux disease)   . Hypertension   . Hypothyroidism   . Lumbar stenosis   . OSA (obstructive sleep apnea)   . Parkinson's disease ([place])   . REM behavioral disorder   . Urinary incontinence     Past Surgical History:  Procedure Laterality Date  . BACK SURGERY    . DEEP BRAIN STIMULATOR PLACEMENT     left 12/27/2012, right 12/10/2011, left revision 02/02/2013, replacement bilateral 11/21/2015  . KNEE SURGERY    . NEPHRECTOMY    . SPINAL FUSION    . TONSILLECTOMY      There were no vitals filed for this visit.  Subjective Assessment - 11/29/17 1250    Subjective  "I noticed this weekend I had a lot of trouble talking"    Currently in Pain?  Yes    Pain Score  3     Pain Location  Hip    Pain Descriptors / Indicators  Aching    Pain Onset  More than a month ago    Pain Frequency  Other (Comment)   chronic pain           ADULT SLP TREATMENT - 11/29/17 1252      General Information   Behavior/Cognition  Alert;Cooperative;Pleasant mood      Treatment Provided   Treatment provided  Cognitive-Linquistic      Cognitive-Linquistic Treatment   Treatment focused on  Dysarthria    Skilled Treatment  Pt stopped SLP in the hall prior to session starting to ask a question with very rapid unintelligible  speech; Pt was completing a timed task for another discipline and was unable to correct prior to needing to continue with task. After initiating session with discussion of the above situation, Pt reports difficulty maintianing slow rate during fast or rushed situations. Pt with acknowledgement of the need to utilize strategies during these times. During brief 8-10 minute conversations, Pt demonstrated usual self-correction of rapid bursts of speech rarely requiring min visual cues to repeat phrases and/or sentences. Pt was intilligible 90% of the time during all conversations. Discussed strategy usage in the home environment to facilitate generalization of controlled "choppy" speech during functional conversations with family and friends.       Assessment / Recommendations / Plan   Plan  Continue with current plan of care      Progression Toward Goals   Progression toward goals  Progressing toward goals       SLP Education - 11/29/17 1306    Education Details  use "choppy" speech and continue using strategies at home and on the phone    Person(s) Educated  Patient;Spouse    Methods  Explanation    Comprehension  Verbalized understanding  SLP Short Term Goals - 11/29/17 1310      SLP SHORT TERM GOAL #1   Title  in sentence resposnes pt will achieve >90% intelligibility with self correction, over three sessions    Status  Partially Met   2/3 sessinos     SLP SHORT TERM GOAL #2   Title  in 5 minutes simple conversation, pt will achieve 90% intelligibility with nonverbal cues    Status  Achieved      SLP SHORT TERM GOAL #3   Title  pt and/or wife will report med administration with puree as more successful than with H2O    Status  Deferred   to [place] #4   Title  pt and/or wife will tell SLP of 3 overt s/s aspiration PNA    Time  1    Status  Deferred       SLP Long Term Goals - 11/29/17 1310      SLP LONG TERM GOAL #1   Title  pt will achieve 90%  intelligibility in 10 minutes simple conversation with nonverbal cues, for three sessions    Baseline  11/29/2017    Time  3    Period  Weeks    Status  On-going      SLP LONG TERM GOAL #2   Title  in 5 minutes simple conversation pt will demo self correction for fast rate/unintelligible speech, to improve intelligiblity to 90%, over three sessions    Baseline  11/29/2017    Time  3    Period  Weeks    Status  On-going      SLP LONG TERM GOAL #3   Title  pt and/or wife will report med administration with puree as more successful than with H2O    Time  3    Period  Weeks    Status  On-going       Plan - 11/29/17 1307    Clinical Impression Statement  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 When Pt saw SLP in the hallway Pt asked SLP a question prior to session that was rapid and unintelligible. Once in therapy session Pt with good carryover today of therapeutic behavior of slowed rate of speech demonstrating self correcting during conversation. When provided non-verbal cue pt will repeat phrase or sentence at an appropriate rate; continue targeting increasing self-correction. Continue targeting carryover and generalizing skilled used in ST to home and with functional situations.Pt would cont to benefit from skilled ST focusing on pt's goal of improving communicative ability with family and in the community.     Speech Therapy Frequency  2x / week    Duration  --   8 weeks/17 sessions   Treatment/Interventions  Aspiration precaution training;SLP instruction and feedback;Compensatory strategies;Patient/family education;Internal/external aids;Multimodal communcation approach;Functional tasks;Cognitive reorganization;Oral motor exercises    Potential to Achieve Goals  Fair    Potential Considerations  Severity of impairments;Ability to learn/carryover information    Consulted and Agree with Plan of Care  Patient;Other  (Comment)    Family Member Consulted  wife Patrick Ross       Patient will benefit from skilled therapeutic intervention in order to improve the following deficits and impairments:   Dysarthria and anarthria    Problem List Patient Active Problem List   Diagnosis Date Noted  . 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 Roddie Mc, CCC-SLP Speech Language Pathologist'  Wende Bushy 11/29/2017, 1:12 PM  [facility] [address] [place], [place], [address] Phone: [phone]   Fax:  [phone]   Name: BERN Ross MRN: [id number] Date of Birth: 04-22-1945

## 2017-11-29 NOTE — Therapy (Signed)
[facility] [address] [place], [place], [address] Phone: [phone]   Fax:  [phone]  Physical Therapy Treatment and 10th visit Progress Note  Patient Details  Name: Patrick Ross MRN: [id number] Date of Birth: [birth year] Referring Provider (PT): Dr. Wells Guiles Tat   Encounter Date: 11/29/2017   This progress note covers the period 10/25/17 to 11/29/2017   PT End of Session - 11/29/17 1948    Visit Number  10    Number of Visits  18    Date for PT Re-Evaluation  01/23/18    Authorization Type  Medicare, Tricare for Life (Will need 10th visit progress notes)    PT Start Time  1018    PT Stop Time  1103    PT Time Calculation (min)  45 min    Equipment Utilized During Treatment  Gait belt    Activity Tolerance  Patient tolerated treatment well;No increased pain    Behavior During Therapy  WFL for tasks assessed/performed       Past Medical History:  Diagnosis Date  . GERD (gastroesophageal reflux disease)   . Hypertension   . Hypothyroidism   . Lumbar stenosis   . OSA (obstructive sleep apnea)   . Parkinson's disease ([facility])   . REM behavioral disorder   . Urinary incontinence     Past Surgical History:  Procedure Laterality Date  . BACK SURGERY    . DEEP BRAIN STIMULATOR PLACEMENT     left 12/27/2012, right 12/10/2011, left revision 02/02/2013, replacement bilateral 11/21/2015  . KNEE SURGERY    . NEPHRECTOMY    . SPINAL FUSION    . TONSILLECTOMY      There were no vitals filed for this visit.  Subjective Assessment - 11/29/17 1020    Subjective  Brought in his record of doing his ex's each day. doing standing ex's in the pool this weekend, walks forward in water.     Patient is accompained by:  Family member    Pertinent History  PD diagnosed 2000, R STN DBS 11/2011, L STN DBS 11/2012 with revision 01/2013; lumbar fusion November 22, 2016, back surgeries, HTN, OSA; dystonia (Botox scheduled in  September); receives injections in hips    Limitations  Walking;Standing    Patient Stated Goals  Pt's goals for physical therapy are to be able to go on camping trip in October (walk with walking poles).    Currently in Pain?  Yes    Pain Score  5     Pain Location  Back    Pain Descriptors / Indicators  Aching    Pain Type  Chronic pain    Pain Onset  More than a month ago         [facility] PT Assessment - 11/29/17 0001      6 Minute Walk- Baseline   6 Minute Walk- Baseline  yes    BP (mmHg)  (!) 137/92      6 Minute walk- Post Test   6 Minute Walk Post Test  yes    BP (mmHg)  (!) 158/103    HR (bpm)  98    Modified Borg Scale for Dyspnea  2- Mild shortness of breath      6 minute walk test results    Aerobic Endurance Distance Walked  792    Endurance additional comments  nearly 300 ft less than 10/27/17  [facility] PT Treatment/Exercise - 11/29/17 1040      Standardized Balance Assessment   Standardized Balance Assessment  Berg Balance Test;Timed Up and Go Test      Berg Balance Test   Sit to Stand  Able to stand  independently using hands    Standing Unsupported  Able to stand 30 seconds unsupported   LOB at 1 minute   Sitting with Back Unsupported but Feet Supported on Floor or Stool  Able to sit safely and securely 2 minutes    Stand to Sit  Controls descent by using hands    Transfers  Able to transfer safely, definite need of hands    Standing Unsupported with Eyes Closed  Able to stand 10 seconds with supervision    Standing Ubsupported with Feet Together  Needs help to attain position but able to stand for 30 seconds with feet together    From Standing, Reach Forward with Outstretched Arm  Can reach forward >12 cm safely (5")    From Standing Position, Pick up Object from Floor  Unable to pick up shoe, but reaches 2-5 cm (1-2") from shoe and balances independently    From Standing Position, Turn to Look Behind Over each Shoulder   Looks behind one side only/other side shows less weight shift    Turn 360 Degrees  Needs close supervision or verbal cueing    Standing Unsupported, Alternately Place Feet on Step/Stool  Able to complete 4 steps without aid or supervision   did 8 steps no LOB with supervison   Standing Unsupported, One Foot in Front  Able to plae foot ahead of the other independently and hold 30 seconds    Standing on One Leg  Tries to lift leg/unable to hold 3 seconds but remains standing independently    Total Score  34       There-ex- see clinical impression for details      PT Education - 11/29/17 1946    Education Details  results of Berg and 6MWT; importance of sitting posture (supporting lumbar curve and not "sacral sitting") to reduce low back/SI joint strain    Person(s) Educated  Patient;Spouse    Methods  Explanation;Demonstration;Verbal cues    Comprehension  Verbalized understanding;Returned demonstration;Verbal cues required;Need further instruction       PT Short Term Goals - 11/29/17 1950      PT SHORT TERM GOAL #1   Title  Pt will be independent with HEP to address balance, functional strengthening and gait.  TARGET:  11/25/17    Time  5    Period  Weeks    Status  On-going      PT SHORT TERM GOAL #2   Title  Pt will improve 5x sit<>stand score to less than or equal to 11.5 seconds (with UE support as needed), for improved transfer efficiency and safety.    Baseline  14.22 sec 11/24/17    Time  5    Period  Weeks    Status  Not Met      PT SHORT TERM GOAL #3   Title  Pt will improve TUG score to less than or equal to 18 seconds for decreased fall risk.    Time  5    Period  Weeks    Status  Achieved      PT SHORT TERM GOAL #4   Title  Pt will improve 6 MWT distance by 100 ft, with less than 2 points increase on pain scale  for low back pain, for improved gait efficiency and safety.    Baseline  1063 ft in 6 MWT 10/27/17; unable to complete 6MWT, 3 minute walk 367 ft;  11/29/17 6 MWT 792 ft (~270 ft less than 8/29)    Time  5    Period  Weeks    Status  Not Met      PT SHORT TERM GOAL #5   Title  Berg Balance test to improve by at least 5 points, for decreased fall risk.    Baseline  Berg 25/56 10/27/17; 11/29/17  34/56    Time  5    Period  Weeks    Status  Achieved        PT Long Term Goals - 10/25/17 1215      PT LONG TERM GOAL #1   Title  Pt/family will verbalize understanding of fall prevention in home environment.  TARGET 12/23/17    Time  9    Period  Weeks    Status  New    Target Date  12/23/17      PT LONG TERM GOAL #2   Title  Pt will improve gait velocity to at least 2.5 ft/sec for improved gait efficiency and safety.    Time  9    Period  Weeks    Status  New    Target Date  12/23/17      PT LONG TERM GOAL #3   Title  Pt will ambulate at least 300 ft, indoor/outdoor surfaces, with bilateral walking poles, with supervision, for pt's goal to negotiate short hike for 72th birthday.    Time  9    Period  Weeks    Status  New    Target Date  12/23/17      PT LONG TERM GOAL #4   Title  Pt will improve TUG cognitive score to less than or equal to 18 seconds for decreased fall risk/improved dual tasking with gait.    Time  5    Period  Weeks    Status  New    Target Date  12/23/17      PT LONG TERM GOAL #5   Title  Pt/family will verbalize techniques to reduce festinating with gait and turns.    Time  5    Period  Weeks    Status  New    Target Date  12/23/17            Plan - 11/29/17 1953    Clinical Impression Statement  Completed assessing STGs with pt overall meeting 2 of 5 goals, 1 goal is ongoing, and 2 goals were not met (including 6 MWT decreased distance by 270 ft). Patient reports his legs "feel better some days than others." Discussed concern that with continued back pain, his leg weakness could be related to his back issues. Lengthy discussion regarding when and how he is completing his back exercises and  stretches. He has been doing piriformis stretch in supine (knee to opposite shoulder) however has been pushing beyond a mild stretch and only holding for 10 seconds. Educated on proper technique. Educated on sitting posture and using folded towel or small pillow to support/create lumbar lordosis as pt tends to sit in posterior pelvic tilt. Wife present and very attentive throughout education.     Rehab Potential  Good    Clinical Impairments Affecting Rehab Potential  hx of multiple back surgeries, bilateral DBS, length of time since dx of Parkinson's; pt and  wife are motivated for participation in therapy, pt active in community and exercise    PT Frequency  2x / week    PT Duration  Other (comment)   9 weeks   PT Treatment/Interventions  ADLs/Self Care Home Management;DME Instruction;Balance training;Therapeutic exercise;Therapeutic activities;Functional mobility training;Gait training;Neuromuscular re-education;Patient/family education    PT Next Visit Plan  work on core stability (especially in upright), balance, and gait training for distance/muscular endurance;  ?walking poles outdoors in preparation for his planned hike for his birthday    Consulted and Agree with Plan of Care  Patient;Family member/caregiver    Family Member Consulted  wife       Patient will benefit from skilled therapeutic intervention in order to improve the following deficits and impairments:  Abnormal gait, Decreased balance, Decreased mobility, Decreased strength, Difficulty walking, Postural dysfunction, Decreased safety awareness  Visit Diagnosis: Other symptoms and signs involving the nervous system  Other symptoms and signs involving the musculoskeletal system  Abnormal posture  Other abnormalities of gait and mobility  Muscle weakness (generalized)     Problem List Patient Active Problem List   Diagnosis Date Noted  . 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

## 2017-12-01 ENCOUNTER — Encounter: Payer: Self-pay | Admitting: Physical Therapy

## 2017-12-01 ENCOUNTER — Ambulatory Visit: Payer: Medicare Other | Admitting: Physical Therapy

## 2017-12-01 ENCOUNTER — Ambulatory Visit: Payer: Medicare Other | Admitting: Occupational Therapy

## 2017-12-01 ENCOUNTER — Ambulatory Visit: Payer: Medicare Other

## 2017-12-01 DIAGNOSIS — R29898 Other symptoms and signs involving the musculoskeletal system: Secondary | ICD-10-CM

## 2017-12-01 DIAGNOSIS — R471 Dysarthria and anarthria: Secondary | ICD-10-CM

## 2017-12-01 DIAGNOSIS — R29818 Other symptoms and signs involving the nervous system: Secondary | ICD-10-CM

## 2017-12-01 DIAGNOSIS — R41840 Attention and concentration deficit: Secondary | ICD-10-CM

## 2017-12-01 DIAGNOSIS — R293 Abnormal posture: Secondary | ICD-10-CM

## 2017-12-01 DIAGNOSIS — R2681 Unsteadiness on feet: Secondary | ICD-10-CM

## 2017-12-01 DIAGNOSIS — R2689 Other abnormalities of gait and mobility: Secondary | ICD-10-CM

## 2017-12-01 DIAGNOSIS — R278 Other lack of coordination: Secondary | ICD-10-CM

## 2017-12-01 NOTE — Therapy (Signed)
[facility] [address] [place], [place], [address] Phone: [phone]   Fax:  [phone]  Occupational Therapy Treatment  Patient Details  Name: Patrick Ross MRN: [id number] Date of Birth: 09/05/1945 No data recorded  Encounter Date: 12/01/2017  OT End of Session - 12/01/17 1128    Visit Number  8    Number of Visits  17    Date for OT Re-Evaluation  12/24/17    Authorization Type  Medicare, Tricare for Life    Authorization Time Period  cert. period 10/25/17-01/23/18    Authorization - Visit Number  9    Authorization - Number of Visits  10    OT Start Time  1020    OT Stop Time  1100    OT Time Calculation (min)  40 min       Past Medical History:  Diagnosis Date  . GERD (gastroesophageal reflux disease)   . Hypertension   . Hypothyroidism   . Lumbar stenosis   . OSA (obstructive sleep apnea)   . Parkinson's disease ([facility])   . REM behavioral disorder   . Urinary incontinence     Past Surgical History:  Procedure Laterality Date  . BACK SURGERY    . DEEP BRAIN STIMULATOR PLACEMENT     left 12/27/2012, right 12/10/2011, left revision 02/02/2013, replacement bilateral 11/21/2015  . KNEE SURGERY    . NEPHRECTOMY    . SPINAL FUSION    . TONSILLECTOMY      There were no vitals filed for this visit.  Subjective Assessment - 12/01/17 1127    Pertinent History  GERD, HTN, hypothyroidism, lumbar stenosis, OSA, REM behavioral disorder, Urinary incontinence, hx of multiple back surgeries (last 2018 lumbar fusion), s/p bilateral DBS (2013,2014,+revision 2014)     Patient Stated Goals  be able to tie both shoes consistently, be able to go on a hike in October     Pain Score  5     Pain Location  Back    Pain Descriptors / Indicators  Aching    Pain Type  Chronic pain    Pain Onset  More than a month ago    Pain Frequency  Intermittent    Aggravating Factors   walking    Pain Relieving Factors  resting          Treatment: Pt performed shoulder flexion/ chest press with ball in seated, min v.c, pt fatigues very quickly Handwriting activities with min v.c  Checked short term goal #2-pt met                 OT Education - 12/01/17 1120    Education Details  Reviewed box exercises in seated (using ball in clinic 15 reps each), started reviewing vision HEPpt performing 10 reps  of vertical exercises, then after 3-5 lateral eye movements pt's vision became blurry so exercises were discontinued, continued to reinforce writing strategies with good sucess.    Person(s) Educated  Patient;Spouse    Methods  Explanation;Demonstration;Verbal cues    Comprehension  Verbalized understanding;Returned demonstration;Verbal cues required       OT Short Term Goals - 12/01/17 1054      OT SHORT TERM GOAL #1   Title  Pt will be independent with updated PD-specific HEP.--check STGs 11/24/17    Status  On-going   11/24/17:  will benefit from reinforcement     OT SHORT TERM GOAL #2   Title  Pt will improve bilateral functional reaching/coordination   for ADLs as shown by improving score on box and blocks test by at least 5 blocks bilaterally.    Status  Achieved   LUE 49, RUE 53      OT SHORT TERM GOAL #3   Title  Pt will be able to write address and 2 sentences with good legibility and only min decr in size.    Status  On-going      OT SHORT TERM GOAL #4   Title  Pt will be able to tie both shoes mod I.    Status  Achieved        OT Long Term Goals - 10/25/17 1812      OT LONG TERM GOAL #1   Title  Pt will verbalize understanding of adaptive strategies for ADLs/IADLs to improve ease, movement quality, safety, and prevent future complications.--check LTGs 12/24/17    Time  8    Period  Weeks    Status  New      OT LONG TERM GOAL #2   Title  Pt will improve balance/functional reaching for IADLs as shown by reaching at least 10 inches with each UE without LOB.    Baseline  R-8", L-9"  (with steps and close supervision)    Time  8    Period  Weeks    Status  New      OT LONG TERM GOAL #3   Title  Pt will be able to stand to complete grooming tasks at counter.    Time  8    Status  New      OT LONG TERM GOAL #4   Title  Pt will improve ability to dress as shown by fastening/unfastening 3 buttons in 46sec or less.    Baseline  56.50sec    Time  8    Period  Weeks    Status  New      OT LONG TERM GOAL #5   Title  Pt will improve dressing as shown by improving time on PPT#4 by at least 5sec with good balance/use of strategies.    Baseline  26.44sec    Time  8    Period  Weeks    Status  New      Long Term Additional Goals   Additional Long Term Goals  Yes      OT LONG TERM GOAL #6   Title  Pt will verbalize understanding of continuing fitness opportunities/appropriate community resources.    Baseline  --    Time  8    Period  Weeks    Status  New            Plan - 12/01/17 1124    Clinical Impression Statement  Pt is progressing towards goals. He met his box/ blocks short term goal and demonstrates improved size and leibility of handwriting. Pt can benefit from continued reinforcement of vision HEP.    Occupational Profile and client history currently impacting functional performance  Pt is independent with BADLs, but needs assist for tying shoes at times.  Pt reports decrease in IADL performance (driving, community activities) and would like to return to hiking.  Pt reports decline, particularly after last back surgeries.      Occupational performance deficits (Please refer to evaluation for details):  ADL's;IADL's;Leisure;Social Participation    Rehab Potential  Good    Current Impairments/barriers affecting progress:  hx of multiple back surgeries, severity of deficits    OT Frequency  2x / week      OT Duration  8 weeks    OT Treatment/Interventions  Self-care/ADL training;Cryotherapy;Paraffin;Therapeutic exercise;DME and/or AE instruction;Functional  Mobility Training;Cognitive remediation/compensation;Balance training;Visual/perceptual remediation/compensation;Manual Therapy;Neuromuscular education;Fluidtherapy;Ultrasound;Moist Heat;Energy conservation;Passive range of motion;Therapeutic activities;Patient/family education    Plan  PN next visit-  review [facility] HEP    OT Home Exercise Plan  Education provided:  coordination HEP, writing strategies    Consulted and Agree with Plan of Care  Patient       Patient will benefit from skilled therapeutic intervention in order to improve the following deficits and impairments:  Decreased balance, Decreased mobility, Decreased endurance, Difficulty walking, Impaired vision/preception, Pain, Improper body mechanics, Impaired tone, Impaired perceived functional ability, Decreased cognition, Decreased activity tolerance, Decreased coordination, Decreased knowledge of use of DME, Decreased safety awareness, Impaired UE functional use, Improper spinal/pelvic alignment  Visit Diagnosis: Other symptoms and signs involving the nervous system  Other symptoms and signs involving the musculoskeletal system  Other lack of coordination  Abnormal posture  Attention and concentration deficit    Problem List Patient Active Problem List   Diagnosis Date Noted  . 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 [place], [place], [address] Phone: [phone]   Fax:  [phone]  Name: Patrick Ross MRN: [id number] Date of Birth: 1945-03-07

## 2017-12-01 NOTE — Therapy (Signed)
[facility] [address] [place], [place], [address] Phone: [phone]   Fax:  [phone]  Speech Language Pathology Treatment  Patient Details  Name: Patrick Ross MRN: [id number] Date of Birth: 1946/01/26 Referring Provider (SLP): Tat, Josephina Shih Date: 12/01/2017  End of Session - 12/01/17 1336    Visit Number  12    Number of Visits  17    Date for SLP Re-Evaluation  01/20/18    SLP Start Time  1103    SLP Stop Time   1148    SLP Time Calculation (min)  45 min    Activity Tolerance  Patient tolerated treatment well       Past Medical History:  Diagnosis Date  . GERD (gastroesophageal reflux disease)   . Hypertension   . Hypothyroidism   . Lumbar stenosis   . OSA (obstructive sleep apnea)   . Parkinson's disease ([facility])   . REM behavioral disorder   . Urinary incontinence     Past Surgical History:  Procedure Laterality Date  . BACK SURGERY    . DEEP BRAIN STIMULATOR PLACEMENT     left 12/27/2012, right 12/10/2011, left revision 02/02/2013, replacement bilateral 11/21/2015  . KNEE SURGERY    . NEPHRECTOMY    . SPINAL FUSION    . TONSILLECTOMY      There were no vitals filed for this visit.  Subjective Assessment - 12/01/17 1104    Subjective  "I'm not too good today"    Currently in Pain?  Yes    Pain Score  5     Pain Location  Hip    Pain Orientation  Right    Pain Descriptors / Indicators  Aching    Pain Onset  More than a month ago            ADULT SLP TREATMENT - 12/01/17 0001      General Information   Behavior/Cognition  Alert;Cooperative;Pleasant mood      Treatment Provided   Treatment provided  Cognitive-Linquistic      Cognitive-Linquistic Treatment   Treatment focused on  Dysarthria    Skilled Treatment  Pt was accompanied to therapy today by a friend. Pt initially introduced SLP and discussed his relation to his friend with some short rushes of speech  requiring occasional non-verbal cues for pt to repeat unintelligible phrases. With a structured description task, pt maintined WNL rate throughout task with occasional self-correction throughout task. Pt with continued insight into his deficits reporting that when he is with familiar people, discussing familiar topics or feels in a rush that it is more difficult to maintain slow rate. During spontaneous conversation with friend, SLP and observing SLP Pt required rare (2) non-verbal cues to slow or repeat speech with multiple instances of self-corrected rate; intelligibility noted at 90% during this group conversation.       Assessment / Recommendations / Plan   Plan  Continue with current plan of care      Progression Toward Goals   Progression toward goals  Progressing toward goals         SLP Short Term Goals - 12/01/17 1341      SLP SHORT TERM GOAL #1   Title  in sentence resposnes pt will achieve >90% intelligibility with self correction, over three sessions    Status  Partially Met   2/3 sessinos     SLP SHORT TERM GOAL #2   Title  in 5 minutes simple  conversation, pt will achieve 90% intelligibility with nonverbal cues    Status  Achieved      SLP SHORT TERM GOAL #3   Title  pt and/or wife will report med administration with puree as more successful than with H2O    Status  Deferred   to [place] #4   Title  pt and/or wife will tell SLP of 3 overt s/s aspiration PNA    Time  1    Status  Deferred       SLP Long Term Goals - 12/01/17 1341      SLP LONG TERM GOAL #1   Title  pt will achieve 90% intelligibility in 10 minutes simple conversation with nonverbal cues, for three sessions    Baseline  11/29/2017, 10, 04/2017    Time  3    Period  Weeks    Status  On-going      SLP LONG TERM GOAL #2   Title  in 5 minutes simple conversation pt will demo self correction for fast rate/unintelligible speech, to improve intelligiblity to 90%, over three  sessions    Baseline  11/29/2017, 12/01/2017    Time  3    Period  Weeks    Status  On-going      SLP LONG TERM GOAL #3   Title  pt and/or wife will report med administration with puree as more successful than with H2O    Time  3    Period  Weeks    Status  On-going       Plan - 12/01/17 1338    Clinical Impression Statement  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 When Pt saw SLP in the hallway Pt asked SLP a question prior to session that was rapid and unintelligible. Once in therapy session Pt with good carryover today of therapeutic behavior of slowed rate of speech demonstrating self correcting during conversation. When provided non-verbal cue pt will repeat phrase or sentence at an appropriate rate; continue targeting increasing self-correction. Continue targeting carryover and generalizing skilled used in ST to home and with functional situations and familiar listeners.Pt would cont to benefit from skilled ST focusing on pt's goal of improving communicative ability with family and in the community.     Speech Therapy Frequency  2x / week    Duration  --   8 weeks/17 sessions   Treatment/Interventions  Aspiration precaution training;SLP instruction and feedback;Compensatory strategies;Patient/family education;Internal/external aids;Multimodal communcation approach;Functional tasks;Cognitive reorganization;Oral motor exercises    Potential to Achieve Goals  Fair    Potential Considerations  Severity of impairments;Ability to learn/carryover information    Consulted and Agree with Plan of Care  Patient;Other (Comment)    Family Member Consulted  wife Jackelyn Poling       Patient will benefit from skilled therapeutic intervention in order to improve the following deficits and impairments:   Dysarthria and anarthria    Problem List Patient Active Problem List   Diagnosis Date Noted  . 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 Roddie Mc, CCC-SLP Speech Language Pathologist   Wende Bushy 12/01/2017, 1:43 PM  [facility] [address] [place], [place], [address] Phone: [phone]   Fax:  [phone]   Name: Patrick Ross MRN: [id number] Date of Birth: [birth year]

## 2017-12-02 NOTE — Therapy (Signed)
[facility] [address] [place], [place], [address] Phone: [phone]   Fax:  [phone]  Physical Therapy Treatment  Patient Details  Name: Patrick Ross MRN: [id number] Date of Birth: 1945-07-14 Referring Provider (PT): Dr. Wells Guiles Tat   Encounter Date: 12/01/2017  PT End of Session - 12/01/17 0944    Visit Number  11    Number of Visits  18    Date for PT Re-Evaluation  01/23/18    Authorization Type  Medicare, T[employer] for Life (Will need 10th visit progress notes)    PT Start Time  0935    PT Stop Time  1016    PT Time Calculation (min)  41 min    Equipment Utilized During Treatment  Gait belt    Activity Tolerance  Patient tolerated treatment well;No increased pain    Behavior During Therapy  WFL for tasks assessed/performed       Past Medical History:  Diagnosis Date  . GERD (gastroesophageal reflux disease)   . Hypertension   . Hypothyroidism   . Lumbar stenosis   . OSA (obstructive sleep apnea)   . Parkinson's disease ([place])   . REM behavioral disorder   . Urinary incontinence     Past Surgical History:  Procedure Laterality Date  . BACK SURGERY    . DEEP BRAIN STIMULATOR PLACEMENT     left 12/27/2012, right 12/10/2011, left revision 02/02/2013, replacement bilateral 11/21/2015  . KNEE SURGERY    . NEPHRECTOMY    . SPINAL FUSION    . TONSILLECTOMY      There were no vitals filed for this visit.  Subjective Assessment - 12/01/17 0938    Subjective  Very few exercises yesterday due to pain "all over" (back, legs, constipation). Feeling a little better today. Reports he thinks the leg pain is coming from his back and doesn't want to have any more surgery. Sees his surgeon in ~2 months.     Patient is accompained by:  Family member    Pertinent History  PD diagnosed 2000, R STN DBS 11/2011, L STN DBS 11/2012 with revision 01/2013; lumbar fusion November 22, 2016, back surgeries, HTN, OSA;  dystonia (Botox scheduled in September); receives injections in hips    Limitations  Walking;Standing    Patient Stated Goals  Pt's goals for physical therapy are to be able to go on camping trip in October (walk with walking poles).    Currently in Pain?  Yes    Pain Score  4     Pain Location  Leg    Pain Orientation  Right    Pain Descriptors / Indicators  Aching;Numbness    Pain Onset  More than a month ago    Pain Frequency  Intermittent    Aggravating Factors   Walking    Pain Relieving Factors  resting    Multiple Pain Sites  Yes    Pain Score  3    Pain Location  Flank    Pain Orientation  Right;Left    Pain Descriptors / Indicators  Tightness    Pain Onset  More than a month ago    Pain Frequency  Constant    Aggravating Factors   unknown    Pain Relieving Factors  rest                       [facility] Adult PT Treatment/Exercise - 12/01/17 0001      Exercises  Exercises  Knee/Hip;Lumbar      Lumbar Exercises: Stretches   Single Knee to Chest Stretch  Right;Left;3 reps;30 seconds    Lower Trunk Rotation  1 rep;10 seconds   could not tolerate     Lumbar Exercises: Aerobic   Other Aerobic Exercise  seated Sci-Fit L2.5 x 5 min warmup        [facility]) - 12/01/17 0956    PWR! exercises  Moves in supine;Moves in standing    PWR! Up  pt reports did this morning    PWR! Rock  x 10    PWR! Twist  x 10 (initially with knees bent, progressing to legs down straight    Comments  pt had not done entire routine this morning (wife out of town    [employer]! Rock  7 to each side at counter wiht single UE support       Balance Exercises - 12/01/17 1700      Balance Exercises: Standing   Standing Eyes Opened  Wide (BOA);Head turns;Narrow base of support (BOS);30 secs;Solid surface   head turns wide BOS; no turns feet together   Standing Eyes Closed  Wide (BOA);Solid surface    Retro Gait  Upper extremity support          PT Short Term Goals - 11/29/17 1950       PT SHORT TERM GOAL #1   Title  Pt will be independent with HEP to address balance, functional strengthening and gait.  TARGET:  11/25/17    Time  5    Period  Weeks    Status  On-going      PT SHORT TERM GOAL #2   Title  Pt will improve 5x sit<>stand score to less than or equal to 11.5 seconds (with UE support as needed), for improved transfer efficiency and safety.    Baseline  14.22 sec 11/24/17    Time  5    Period  Weeks    Status  Not Met      PT SHORT TERM GOAL #3   Title  Pt will improve TUG score to less than or equal to 18 seconds for decreased fall risk.    Time  5    Period  Weeks    Status  Achieved      PT SHORT TERM GOAL #4   Title  Pt will improve 6 MWT distance by 100 ft, with less than 2 points increase on pain scale for low back pain, for improved gait efficiency and safety.    Baseline  1063 ft in 6 MWT 10/27/17; unable to complete 6MWT, 3 minute walk 367 ft; 11/29/17 6 MWT 792 ft (~270 ft less than 8/29)    Time  5    Period  Weeks    Status  Not Met      PT SHORT TERM GOAL #5   Title  Berg Balance test to improve by at least 5 points, for decreased fall risk.    Baseline  Berg 25/56 10/27/17; 11/29/17  34/56    Time  5    Period  Weeks    Status  Achieved        PT Long Term Goals - 10/25/17 1215      PT LONG TERM GOAL #1   Title  Pt/family will verbalize understanding of fall prevention in home environment.  TARGET 12/23/17    Time  9    Period  Weeks    Status  New  Target Date  12/23/17      PT LONG TERM GOAL #2   Title  Pt will improve gait velocity to at least 2.5 ft/sec for improved gait efficiency and safety.    Time  9    Period  Weeks    Status  New    Target Date  12/23/17      PT LONG TERM GOAL #3   Title  Pt will ambulate at least 300 ft, indoor/outdoor surfaces, with bilateral walking poles, with supervision, for pt's goal to negotiate short hike for 72th birthday.    Time  9    Period  Weeks    Status  New    Target Date   12/23/17      PT LONG TERM GOAL #4   Title  Pt will improve TUG cognitive score to less than or equal to 18 seconds for decreased fall risk/improved dual tasking with gait.    Time  5    Period  Weeks    Status  New    Target Date  12/23/17      PT LONG TERM GOAL #5   Title  Pt/family will verbalize techniques to reduce festinating with gait and turns.    Time  5    Period  Weeks    Status  New    Target Date  12/23/17            Plan - 12/01/17 1700    Clinical Impression Statement  Patient again limited by back and leg pain today. He now reports the leg pain and weakness is likely coming from his back (he has resisted this idea) and immediately states he does not want anymore back surgery. Tolerated modified supine PWR! exercises with cues to move slowly. Poor tolerance of standing activities and required frequent seated rests due to pain and fatigue. Uncertain how much pt can currently benefit from PT due to increasingly limiting back and leg pain, however he does admit decreased pain when he does his PWR! exercises and will continue to emphasize his need to do these twice daily.     Rehab Potential  Good    Clinical Impairments Affecting Rehab Potential  hx of multiple back surgeries, bilateral DBS, length of time since dx of Parkinson's; pt and wife are motivated for participation in therapy, pt active in community and exercise    PT Frequency  2x / week    PT Duration  Other (comment)   9 weeks   PT Treatment/Interventions  ADLs/Self Care Home Management;DME Instruction;Balance training;Therapeutic exercise;Therapeutic activities;Functional mobility training;Gait training;Neuromuscular re-education;Patient/family education    PT Next Visit Plan  start with supine PWR! if has PT first (often OT will do them if has OT first); work on core stability (especially in upright), balance, and gait training for distance/muscular endurance;  ?walking poles outdoors in preparation for his  planned hike for his birthday    Consulted and Agree with Plan of Care  Patient;Family member/caregiver    Family Member Consulted  wife       Patient will benefit from skilled therapeutic intervention in order to improve the following deficits and impairments:  Abnormal gait, Decreased balance, Decreased mobility, Decreased strength, Difficulty walking, Postural dysfunction, Decreased safety awareness  Visit Diagnosis: Other symptoms and signs involving the nervous system  Other symptoms and signs involving the musculoskeletal system  Abnormal posture  Other abnormalities of gait and mobility  Unsteadiness on feet     Problem List Patient Active  Problem List   Diagnosis Date Noted  . 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

## 2017-12-06 ENCOUNTER — Ambulatory Visit: Payer: Medicare Other | Admitting: Occupational Therapy

## 2017-12-06 ENCOUNTER — Encounter: Payer: Self-pay | Admitting: Physical Therapy

## 2017-12-06 ENCOUNTER — Ambulatory Visit: Payer: Medicare Other | Admitting: Speech Pathology

## 2017-12-06 ENCOUNTER — Ambulatory Visit: Payer: Medicare Other | Admitting: Physical Therapy

## 2017-12-06 DIAGNOSIS — R471 Dysarthria and anarthria: Secondary | ICD-10-CM

## 2017-12-06 DIAGNOSIS — R41840 Attention and concentration deficit: Secondary | ICD-10-CM

## 2017-12-06 DIAGNOSIS — R293 Abnormal posture: Secondary | ICD-10-CM

## 2017-12-06 DIAGNOSIS — R29818 Other symptoms and signs involving the nervous system: Secondary | ICD-10-CM

## 2017-12-06 DIAGNOSIS — R29898 Other symptoms and signs involving the musculoskeletal system: Secondary | ICD-10-CM

## 2017-12-06 DIAGNOSIS — R278 Other lack of coordination: Secondary | ICD-10-CM

## 2017-12-06 DIAGNOSIS — R2689 Other abnormalities of gait and mobility: Secondary | ICD-10-CM

## 2017-12-06 NOTE — Therapy (Signed)
[facility] [address] [place], [place], [address] Phone: [phone]   Fax:  [phone]  Speech Language Pathology Treatment  Patient Details  Name: Patrick Ross MRN: [id number] Date of Birth: 03-Oct-1945 Referring Provider (SLP): Tat, Josephina Shih Date: 12/06/2017  End of Session - 12/06/17 1303    Visit Number  13    Number of Visits  17    Date for SLP Re-Evaluation  01/20/18    SLP Start Time  35    SLP Stop Time   1100    SLP Time Calculation (min)  42 min    Activity Tolerance  Patient tolerated treatment well       Past Medical History:  Diagnosis Date  . GERD (gastroesophageal reflux disease)   . Hypertension   . Hypothyroidism   . Lumbar stenosis   . OSA (obstructive sleep apnea)   . Parkinson's disease ([place])   . REM behavioral disorder   . Urinary incontinence     Past Surgical History:  Procedure Laterality Date  . BACK SURGERY    . DEEP BRAIN STIMULATOR PLACEMENT     left 12/27/2012, right 12/10/2011, left revision 02/02/2013, replacement bilateral 11/21/2015  . KNEE SURGERY    . NEPHRECTOMY    . SPINAL FUSION    . TONSILLECTOMY      There were no vitals filed for this visit.  Subjective Assessment - 12/06/17 1026    Subjective  "I did my homework. It was having a conversation with Mickel Baas."    Patient is accompained by:  Family member    Currently in Pain?  Yes    Pain Score  5     Pain Location  Back    Pain Orientation  Right    Pain Descriptors / Indicators  Aching;Sharp    Pain Type  Chronic pain    Pain Onset  More than a month ago    Pain Frequency  Intermittent            ADULT SLP TREATMENT - 12/06/17 1018      General Information   Behavior/Cognition  Alert;Cooperative;Pleasant mood      Treatment Provided   Treatment provided  Cognitive-Linquistic      Cognitive-Linquistic Treatment   Treatment focused on  Dysarthria    Skilled Treatment  Pt  arrived with wife, speaking rapidly to SLP, and did not initially respond to non-verbal cues to slow rate or repeat unintelligble speech. Engaged pt in structured sentence level task, during which pt required occasional non-verbal cues to correct short rushes of speech. SLP interjected short conversations intermittently. Pt with occasional self-correction, continued to require occasional non-verbal cues for carryover of rate, pausing in conversation.       Assessment / Recommendations / Plan   Plan  Continue with current plan of care      Progression Toward Goals   Progression toward goals  Progressing toward goals         SLP Short Term Goals - 12/06/17 1305      SLP SHORT TERM GOAL #1   Title  in sentence resposnes pt will achieve >90% intelligibility with self correction, over three sessions    Status  Partially Met      SLP SHORT TERM GOAL #2   Title  in 5 minutes simple conversation, pt will achieve 90% intelligibility with nonverbal cues    Status  Achieved      SLP SHORT TERM GOAL #3  Title  pt and/or wife will report med administration with puree as more successful than with H2O    Status  Deferred      SLP SHORT TERM GOAL #4   Title  pt and/or wife will tell SLP of 3 overt s/s aspiration PNA    Status  Deferred       SLP Long Term Goals - 12/06/17 1305      SLP LONG TERM GOAL #1   Title  pt will achieve 90% intelligibility in 10 minutes simple conversation with nonverbal cues, for three sessions    Baseline  11/29/2017, 10, 04/2017    Time  2    Period  Weeks    Status  On-going      SLP LONG TERM GOAL #2   Title  in 5 minutes simple conversation pt will demo self correction for fast rate/unintelligible speech, to improve intelligiblity to 90%, over three sessions    Baseline  11/29/2017, 12/01/2017    Time  2    Period  Weeks    Status  On-going      SLP LONG TERM GOAL #3   Title  pt and/or wife will report med administration with puree as more successful than  with H2O    Time  2    Period  Weeks    Status  On-going       Plan - 12/06/17 1303    Clinical Impression Statement  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 Initially pt conversation with SLP was rapid, with frequent short rushes of speech. Occasional non-verbal cues required to maintain slow rate/ correct short rushes of speech in structured tasks and conversation. When provided non-verbal cue pt will repeat phrase or sentence at an appropriate rate; continue targeting increasing self-correction. Continue targeting carryover and generalizing skilled used in ST to home and with functional situations and familiar listeners.Pt would cont to benefit from skilled ST focusing on pt's goal of improving communicative ability with family and in the community.     Speech Therapy Frequency  2x / week    Treatment/Interventions  Aspiration precaution training;SLP instruction and feedback;Compensatory strategies;Patient/family education;Internal/external aids;Multimodal communcation approach;Functional tasks;Cognitive reorganization;Oral motor exercises    Potential to Achieve Goals  Fair    Potential Considerations  Severity of impairments;Ability to learn/carryover information       Patient will benefit from skilled therapeutic intervention in order to improve the following deficits and impairments:   Dysarthria and anarthria    Problem List Patient Active Problem List   Diagnosis Date Noted  . 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 [place], [place], [address] Phone: [phone]   Fax:  [phone]   Name: Patrick Ross MRN: [id number] Date of Birth: 1945/11/10

## 2017-12-07 ENCOUNTER — Encounter: Payer: Self-pay | Admitting: Physical Therapy

## 2017-12-07 NOTE — Therapy (Signed)
[facility] [address] [place], [place], [address] Phone: [phone]   Fax:  [phone]  Physical Therapy Treatment  Patient Details  Name: MAKAI DUMOND MRN: [id number] Date of Birth: [birth year] Referring Provider (PT): Dr. Wells Guiles Tat   Encounter Date: 12/06/2017  PT End of Session - 12/07/17 2103    Visit Number  12    Number of Visits  18    Date for PT Re-Evaluation  01/23/18    Authorization Type  Medicare, Tricare for Life (Will need 10th visit progress notes)    PT Start Time  1147    PT Stop Time  1229    PT Time Calculation (min)  42 min    Equipment Utilized During Treatment  Gait belt    Activity Tolerance  Patient tolerated treatment well;No increased pain   Rated pain as 2/10 at end of session   Behavior During Therapy  [facility] for tasks assessed/performed       Past Medical History:  Diagnosis Date  . GERD (gastroesophageal reflux disease)   . Hypertension   . Hypothyroidism   . Lumbar stenosis   . OSA (obstructive sleep apnea)   . Parkinson's disease ([place])   . REM behavioral disorder   . Urinary incontinence     Past Surgical History:  Procedure Laterality Date  . BACK SURGERY    . DEEP BRAIN STIMULATOR PLACEMENT     left 12/27/2012, right 12/10/2011, left revision 02/02/2013, replacement bilateral 11/21/2015  . KNEE SURGERY    . NEPHRECTOMY    . SPINAL FUSION    . TONSILLECTOMY      There were no vitals filed for this visit.                    Door Adult PT Treatment/Exercise - 12/07/17 2056      Transfers   Transfers  Sit to Stand;Stand to Sit    Sit to Stand  5: Supervision;With upper extremity assist;From bed    Sit to Stand Details (indicate cue type and reason)  Cues to avoid pushing through walking poles to stand up    Stand to Sit  5: Supervision;With upper extremity assist;To bed      Ambulation/Gait   Ambulation/Gait  Yes    Ambulation/Gait  Assistance  4: Min guard    Ambulation/Gait Assistance Details  VCs for widened BOS and slowed pace of gait    Ambulation Distance (Feet)  225 Feet   x 2   Assistive device  --   bilateral walking poles   Gait Pattern  Step-through pattern;Decreased dorsiflexion - right;Decreased dorsiflexion - left;Scissoring;Narrow base of support;Decreased step length - left;Decreased weight shift to left;Decreased stance time - right    Ambulation Surface  Outdoor;Unlevel    Gait Comments  Outdoor gait activities using bilateral walking poles with pt appearing to have more upright posture and increased stamina for gait.  Pt rates pain as 2/10 after outdoor gait with walking poles.      Posture/Postural Control   Posture/Postural Control  Postural limitations    Posture Comments  attempted seated anterior /posterior pelvic tilts, x 5 reps with manual facilitation-pt c/o increased back pain.      Gait 50 ft x 2 with U-step RW with cues for relaxed posture through shoulders.  Ustep brakes are no longer tight enough to keep walker from moving.  Pt is aware and they are planning to contact the company.   PWR! Moves  in supine:  PWR! Up x 10 reps    PWR! Rock x 5 reps each side    PWR! Twist x 5 reps each side    PWR! Step (modified for hooklying marching in place) x 10 reps      PT Short Term Goals - 11/29/17 1950      PT SHORT TERM GOAL #1   Title  Pt will be independent with HEP to address balance, functional strengthening and gait.  TARGET:  11/25/17    Time  5    Period  Weeks    Status  On-going      PT SHORT TERM GOAL #2   Title  Pt will improve 5x sit<>stand score to less than or equal to 11.5 seconds (with UE support as needed), for improved transfer efficiency and safety.    Baseline  14.22 sec 11/24/17    Time  5    Period  Weeks    Status  Not Met      PT SHORT TERM GOAL #3   Title  Pt will improve TUG score to less than or equal to 18 seconds for decreased fall risk.    Time  5     Period  Weeks    Status  Achieved      PT SHORT TERM GOAL #4   Title  Pt will improve 6 MWT distance by 100 ft, with less than 2 points increase on pain scale for low back pain, for improved gait efficiency and safety.    Baseline  1063 ft in 6 MWT 10/27/17; unable to complete 6MWT, 3 minute walk 367 ft; 11/29/17 6 MWT 792 ft (~270 ft less than 8/29)    Time  5    Period  Weeks    Status  Not Met      PT SHORT TERM GOAL #5   Title  Berg Balance test to improve by at least 5 points, for decreased fall risk.    Baseline  Berg 25/56 10/27/17; 11/29/17  34/56    Time  5    Period  Weeks    Status  Achieved        PT Long Term Goals - 10/25/17 1215      PT LONG TERM GOAL #1   Title  Pt/family will verbalize understanding of fall prevention in home environment.  TARGET 12/23/17    Time  9    Period  Weeks    Status  New    Target Date  12/23/17      PT LONG TERM GOAL #2   Title  Pt will improve gait velocity to at least 2.5 ft/sec for improved gait efficiency and safety.    Time  9    Period  Weeks    Status  New    Target Date  12/23/17      PT LONG TERM GOAL #3   Title  Pt will ambulate at least 300 ft, indoor/outdoor surfaces, with bilateral walking poles, with supervision, for pt's goal to negotiate short hike for 72th birthday.    Time  9    Period  Weeks    Status  New    Target Date  12/23/17      PT LONG TERM GOAL #4   Title  Pt will improve TUG cognitive score to less than or equal to 18 seconds for decreased fall risk/improved dual tasking with gait.    Time  5    Period  Weeks  Status  New    Target Date  12/23/17      PT LONG TERM GOAL #5   Title  Pt/family will verbalize techniques to reduce festinating with gait and turns.    Time  5    Period  Weeks    Status  New    Target Date  12/23/17            Plan - 12/07/17 2103    Clinical Impression Statement  Pt tolerates supine PWR! Moves well and performs with good technique.  Gait today on outdoor  surfaces with walking poles, with pt noted to have improved posture, decreased back pain and improved stamina for gait.  However, pt needs min guard assist due to scissoring with gait and hastening of gait at times.  Pt will continue to benefit from skilled PT to address balance, posture, and gait.    Rehab Potential  Good    Clinical Impairments Affecting Rehab Potential  hx of multiple back surgeries, bilateral DBS, length of time since dx of Parkinson's; pt and wife are motivated for participation in therapy, pt active in community and exercise    PT Frequency  2x / week    PT Duration  Other (comment)   9 weeks   PT Treatment/Interventions  ADLs/Self Care Home Management;DME Instruction;Balance training;Therapeutic exercise;Therapeutic activities;Functional mobility training;Gait training;Neuromuscular re-education;Patient/family education    PT Next Visit Plan  start with supine PWR! if has PT first (often OT will do them if has OT first); work on core stability (especially in upright), balance, and gait training for distance/muscular endurance;  ?walking poles outdoors in preparation for his planned hike for his birthday    Consulted and Agree with Plan of Care  Patient;Family member/caregiver    Family Member Consulted  wife       Patient will benefit from skilled therapeutic intervention in order to improve the following deficits and impairments:  Abnormal gait, Decreased balance, Decreased mobility, Decreased strength, Difficulty walking, Postural dysfunction, Decreased safety awareness  Visit Diagnosis: Abnormal posture  Other abnormalities of gait and mobility  Other symptoms and signs involving the nervous system     Problem List Patient Active Problem List   Diagnosis Date Noted  . 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 12/07/2017, 9:07 PM  Frazier Butt., PT   [facility] [address] [place], [place], [address] Phone: [phone]   Fax:  [phone]  Name: WEBB WEED MRN: [id number] Date of Birth: 11-Oct-1945

## 2017-12-07 NOTE — Therapy (Signed)
[facility] [address] [place], [place], [address] Phone: [phone]   Fax:  [phone]  Occupational Therapy Treatment  Patient Details  Name: Patrick Ross MRN: [id number] Date of Birth: May 21, 1945 No data recorded  Encounter Date: 12/06/2017  OT End of Session - 12/06/17 1109    Visit Number  9    Number of Visits  17    Date for OT Re-Evaluation  12/24/17    Authorization Type  Medicare, Tricare for Life    Authorization - Visit Number  10    Authorization - Number of Visits  10    OT Start Time  1105    OT Stop Time  1145    OT Time Calculation (min)  40 min    Activity Tolerance  Patient tolerated treatment well    Behavior During Therapy  [facility] for tasks assessed/performed       Past Medical History:  Diagnosis Date  . GERD (gastroesophageal reflux disease)   . Hypertension   . Hypothyroidism   . Lumbar stenosis   . OSA (obstructive sleep apnea)   . Parkinson's disease ([place])   . REM behavioral disorder   . Urinary incontinence     Past Surgical History:  Procedure Laterality Date  . BACK SURGERY    . DEEP BRAIN STIMULATOR PLACEMENT     left 12/27/2012, right 12/10/2011, left revision 02/02/2013, replacement bilateral 11/21/2015  . KNEE SURGERY    . NEPHRECTOMY    . SPINAL FUSION    . TONSILLECTOMY      There were no vitals filed for this visit.  Subjective Assessment - 12/07/17 1720    Pertinent History  GERD, HTN, hypothyroidism, lumbar stenosis, OSA, REM behavioral disorder, Urinary incontinence, hx of multiple back surgeries (last 2018 lumbar fusion), s/p bilateral DBS ([id number],+[id number] 2014)     Limitations  fall risk, bilateral deep brain stimulator (DBS), hx of multiple spinal surgeries     Patient Stated Goals  be able to tie both shoes consistently, be able to go on a hike in October     Currently in Pain?  Yes    Pain Score  3     Pain Location  Back    Pain Orientation  Lower     Pain Descriptors / Indicators  Aching    Pain Type  Chronic pain    Pain Onset  More than a month ago    Pain Frequency  Intermittent    Aggravating Factors   walking    Pain Relieving Factors  resting             Treatment: Reviewed seated ball exercise HEP, min v.c for posture. Discussion with pt/ wife regarding safety when shaving, pt reports he kneels at times. Therapist recommends pt sits on toilet instead to minimize fall risk. Standing to copy small peg design with right and left UE's for increased shoulder flexion,, and standing balance with a cognitive component, min v.c, rest break required.                  OT Short Term Goals - 12/07/17 1724      OT SHORT TERM GOAL #1   Title  Pt will be independent with updated PD-specific HEP.--check STGs 11/24/17    Status  Achieved      OT SHORT TERM GOAL #2   Title  Pt will improve bilateral functional reaching/coordination for ADLs as shown by improving score on box and blocks  test by at least 5 blocks bilaterally.    Status  Achieved   LUE 49, RUE 53      OT SHORT TERM GOAL #3   Title  Pt will be able to write address and 2 sentences with good legibility and only min decr in size.    Status  Achieved      OT SHORT TERM GOAL #4   Title  Pt will be able to tie both shoes mod I.    Status  Achieved        OT Long Term Goals - 10/25/17 1812      OT LONG TERM GOAL #1   Title  Pt will verbalize understanding of adaptive strategies for ADLs/IADLs to improve ease, movement quality, safety, and prevent future complications.--check LTGs 12/24/17    Time  8    Period  Weeks    Status  New      OT LONG TERM GOAL #2   Title  Pt will improve balance/functional reaching for IADLs as shown by reaching at least 10 inches with each UE without LOB.    Baseline  R-8", L-9" (with steps and close supervision)    Time  8    Period  Weeks    Status  New      OT LONG TERM GOAL #3   Title  Pt will be able to stand  to complete grooming tasks at counter.    Time  8    Status  New      OT LONG TERM GOAL #4   Title  Pt will improve ability to dress as shown by fastening/unfastening 3 buttons in 46sec or less.    Baseline  56.50sec    Time  8    Period  Weeks    Status  New      OT LONG TERM GOAL #5   Title  Pt will improve dressing as shown by improving time on PPT#4 by at least 5sec with good balance/use of strategies.    Baseline  26.44sec    Time  8    Period  Weeks    Status  New      Long Term Additional Goals   Additional Long Term Goals  Yes      OT LONG TERM GOAL #6   Title  Pt will verbalize understanding of continuing fitness opportunities/appropriate community resources.    Baseline  --    Time  8    Period  Weeks    Status  New            Plan - 12/07/17 1722    Clinical Impression Statement  Pt has made good overall progress. He has achieved all short term goals. Pt can benefit from continued skilled occupational therapy to maximize safety and independence with ADLs/IADLs.    Occupational Profile and client history currently impacting functional performance  Pt is independent with BADLs, but needs assist for tying shoes at times.  Pt reports decrease in IADL performance (driving, community activities) and would like to return to hiking.  Pt reports decline, particularly after last back surgeries.      Occupational performance deficits (Please refer to evaluation for details):  ADL's;IADL's;Leisure;Social Participation    Rehab Potential  Good    Current Impairments/barriers affecting progress:  hx of multiple back surgeries, severity of deficits    OT Frequency  2x / week    OT Duration  8 weeks    OT Treatment/Interventions  Self-care/ADL training;Cryotherapy;Paraffin;Therapeutic exercise;DME and/or AE instruction;Functional Mobility Training;Cognitive remediation/compensation;Balance training;Visual/perceptual remediation/compensation;Manual Therapy;Neuromuscular  education;Fluidtherapy;Ultrasound;Moist Heat;Energy conservation;Passive range of motion;Therapeutic activities;Patient/family education    Plan  work towrds unmet long term goals    Consulted and Agree with Plan of Care  Patient    Family Member Consulted  wife       Patient will benefit from skilled therapeutic intervention in order to improve the following deficits and impairments:  Decreased balance, Decreased mobility, Decreased endurance, Difficulty walking, Impaired vision/preception, Pain, Improper body mechanics, Impaired tone, Impaired perceived functional ability, Decreased cognition, Decreased activity tolerance, Decreased coordination, Decreased knowledge of use of DME, Decreased safety awareness, Impaired UE functional use, Improper spinal/pelvic alignment  Visit Diagnosis: Other symptoms and signs involving the nervous system  Other symptoms and signs involving the musculoskeletal system  Other lack of coordination  Abnormal posture  Attention and concentration deficit   Occupational Therapy Progress Note  Dates of Reporting Period: 10/25/17 to 12/07/17  Objective Reports of Subjective Statement: see goals  Objective Measurements: see box/ blocks  Goal Update: all short term goals met  Plan: Continue to work towards unmet goals.  Reason Skilled Services are Required: Pt can benefit from skilled OT to address decreased coordination, decreased balance, rigidity, decreased ROM,pain in order to maximize pt's safety and I with ADLS/IADLS. Problem List Patient Active Problem List   Diagnosis Date Noted  . 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 [place], [place], [address] Phone: [phone]   Fax:  [phone]  Name: ADYAN PALAU MRN: [id number] Date of Birth: 1945/06/04

## 2017-12-08 ENCOUNTER — Ambulatory Visit: Payer: Medicare Other | Admitting: Physical Therapy

## 2017-12-08 ENCOUNTER — Ambulatory Visit: Payer: Medicare Other | Admitting: *Deleted

## 2017-12-08 ENCOUNTER — Encounter: Payer: Self-pay | Admitting: Physical Therapy

## 2017-12-08 VITALS — BP 136/86 | HR 95

## 2017-12-08 DIAGNOSIS — R293 Abnormal posture: Secondary | ICD-10-CM

## 2017-12-08 DIAGNOSIS — R2689 Other abnormalities of gait and mobility: Secondary | ICD-10-CM

## 2017-12-08 DIAGNOSIS — R471 Dysarthria and anarthria: Secondary | ICD-10-CM

## 2017-12-08 DIAGNOSIS — R29818 Other symptoms and signs involving the nervous system: Secondary | ICD-10-CM | POA: Diagnosis not present

## 2017-12-08 DIAGNOSIS — R2681 Unsteadiness on feet: Secondary | ICD-10-CM

## 2017-12-08 NOTE — Patient Instructions (Addendum)

## 2017-12-08 NOTE — Therapy (Signed)
[facility] [address] [place], [place], [address] Phone: [phone]   Fax:  [phone]  Speech Language Pathology Treatment  Patient Details  Name: Patrick Ross MRN: [id number] Date of Birth: 09-Nov-1945 Referring Provider (SLP): Alonza Bogus   Encounter Date: 12/08/2017  End of Session - 12/08/17 1112    Visit Number  14    Number of Visits  17    Date for SLP Re-Evaluation  01/20/18    SLP Start Time  1101    SLP Stop Time   1146    SLP Time Calculation (min)  45 min    Activity Tolerance  Patient tolerated treatment well       Past Medical History:  Diagnosis Date  . GERD (gastroesophageal reflux disease)   . Hypertension   . Hypothyroidism   . Lumbar stenosis   . OSA (obstructive sleep apnea)   . Parkinson's disease ([place])   . REM behavioral disorder   . Urinary incontinence     Past Surgical History:  Procedure Laterality Date  . BACK SURGERY    . DEEP BRAIN STIMULATOR PLACEMENT     left 12/27/2012, right 12/10/2011, left revision 02/02/2013, replacement bilateral 11/21/2015  . KNEE SURGERY    . NEPHRECTOMY    . SPINAL FUSION    . TONSILLECTOMY      There were no vitals filed for this visit.  Subjective Assessment - 12/08/17 1107    Subjective  "I'm really worn out from PT today"    Currently in Pain?  Yes    Pain Score  4     Pain Location  Hip    Pain Orientation  Right    Pain Descriptors / Indicators  Aching;Constant    Pain Onset  More than a month ago    Pain Onset  More than a month ago            ADULT SLP TREATMENT - 12/08/17 0001      General Information   Behavior/Cognition  Alert;Cooperative;Pleasant mood      Treatment Provided   Treatment provided  Cognitive-Linquistic      Cognitive-Linquistic Treatment   Treatment focused on  Dysarthria    Skilled Treatment  Pt demonstrated increased rate with initial conversation requiring occasional visual and rare  verbal cues to correct or repeat unitelligible rapid speech. Targeted structured sentence level task and Pt required rare non-verbal cues to slow or repeat rushes of speech. with 15 minute conversation, Pt required occasional visual cues to maintain intelligible rate with rare self-correction. Utilized cell phone talk-to-text to facilitate functional practice of "choppy" speech. Pt was encouraged to utilize "Siri" to practice "Choppy" and over-articulated speech.       Assessment / Recommendations / Plan   Plan  Continue with current plan of care      Progression Toward Goals   Progression toward goals  Progressing toward goals         SLP Short Term Goals - 12/06/17 1305      SLP SHORT TERM GOAL #1   Title  in sentence resposnes pt will achieve >90% intelligibility with self correction, over three sessions    Status  Partially Met      SLP SHORT TERM GOAL #2   Title  in 5 minutes simple conversation, pt will achieve 90% intelligibility with nonverbal cues    Status  Achieved      SLP SHORT TERM GOAL #3   Title  pt  and/or wife will report med administration with puree as more successful than with H2O    Status  Deferred      SLP SHORT TERM GOAL #4   Title  pt and/or wife will tell SLP of 3 overt s/s aspiration PNA    Status  Deferred       SLP Long Term Goals - 12/08/17 1230      SLP LONG TERM GOAL #1   Title  pt will achieve 90% intelligibility in 10 minutes simple conversation with nonverbal cues, for three sessions    Baseline  11/29/2017, 10, 04/2017    Time  2    Period  Weeks    Status  On-going      SLP LONG TERM GOAL #2   Title  in 5 minutes simple conversation pt will demo self correction for fast rate/unintelligible speech, to improve intelligiblity to 90%, over three sessions    Baseline  11/29/2017, 12/01/2017    Time  2    Period  Weeks    Status  On-going      SLP LONG TERM GOAL #3   Title  pt and/or wife will report med administration with puree as more  successful than with H2O    Time  2    Period  Weeks    Status  On-going       Plan - 12/08/17 1117    Clinical Impression Statement  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 Again today, Pt with increased fatigue this date (ST was after OT and PT); Requiring increased cueing for rate control during conversation. Occasional non-verbal cues required to maintain slow rate/ correct short rushes of speech in structured tasks and conversation. When provided non-verbal cue pt will repeat phrase or sentence at an appropriate rate; continue targeting increasing self-correction. Continue targeting carryover and generalizing skilled used in ST to home and with functional situations and familiar listeners.Pt would cont to benefit from skilled ST focusing on pt's goal of improving communicative ability with family and in the community.     Speech Therapy Frequency  2x / week    Treatment/Interventions  Aspiration precaution training;SLP instruction and feedback;Compensatory strategies;Patient/family education;Internal/external aids;Multimodal communcation approach;Functional tasks;Cognitive reorganization;Oral motor exercises    Potential to Achieve Goals  Fair    Potential Considerations  Severity of impairments;Ability to learn/carryover information       Patient will benefit from skilled therapeutic intervention in order to improve the following deficits and impairments:   Dysarthria and anarthria    Problem List Patient Active Problem List   Diagnosis Date Noted  . 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 Roddie Mc, CCC-SLP Speech Language Pathologist   Wende Bushy 12/08/2017, 1:55 PM  [facility] [address] [place], [place], [address] Phone: [phone]   Fax:  [phone]   Name: Patrick Ross MRN: [id number] Date of Birth: 01/16/1946

## 2017-12-08 NOTE — Therapy (Signed)
[facility] [address] [place], [place], [address] Phone: [phone]   Fax:  [phone]  Physical Therapy Treatment  Patient Details  Name: Patrick Ross MRN: [id number] Date of Birth: 12/07/1945 Referring Provider (PT): Dr. Wells Guiles Tat   Encounter Date: 12/08/2017  PT End of Session - 12/08/17 2133    Visit Number  13    Number of Visits  18    Date for PT Re-Evaluation  01/23/18    Authorization Type  Medicare, Tricare for Life (Will need 10th visit progress notes)    PT Start Time  1020    PT Stop Time  1100    PT Time Calculation (min)  40 min    Equipment Utilized During Treatment  Gait belt    Activity Tolerance  Patient tolerated treatment well;No increased pain   Rated pain as 2/10 at end of session   Behavior During Therapy  [facility] for tasks assessed/performed       Past Medical History:  Diagnosis Date  . GERD (gastroesophageal reflux disease)   . Hypertension   . Hypothyroidism   . Lumbar stenosis   . OSA (obstructive sleep apnea)   . Parkinson's disease ([place])   . REM behavioral disorder   . Urinary incontinence     Past Surgical History:  Procedure Laterality Date  . BACK SURGERY    . DEEP BRAIN STIMULATOR PLACEMENT     left 12/27/2012, right 12/10/2011, left revision 02/02/2013, replacement bilateral 11/21/2015  . KNEE SURGERY    . NEPHRECTOMY    . SPINAL FUSION    . TONSILLECTOMY      Vitals:   12/08/17 1018 12/08/17 1055  BP: (!) 164/96 136/86  Pulse: 99 95    Subjective Assessment - 12/08/17 1018    Subjective  "I'm here."  Having some pain in my side.    Patient is accompained by:  Family member    Pertinent History  PD diagnosed 2000, R STN DBS 11/2011, L STN DBS 11/2012 with revision 01/2013; lumbar fusion November 22, 2016, back surgeries, HTN, OSA; dystonia (Botox scheduled in September); receives injections in hips    Limitations  Walking;Standing    Patient Stated  Goals  Pt's goals for physical therapy are to be able to go on camping trip in October (walk with walking poles).    Currently in Pain?  Yes    Pain Score  3     Pain Location  Back    Pain Orientation  Lower    Pain Descriptors / Indicators  Aching    Pain Type  Chronic pain    Pain Onset  More than a month ago    Pain Frequency  Intermittent    Aggravating Factors   walking     Pain Relieving Factors  resting    Pain Onset  More than a month ago                       [facility] Adult PT Treatment/Exercise - 12/08/17 1025      Transfers   Transfers  Sit to Stand;Stand to Sit    Sit to Stand  5: Supervision;With upper extremity assist;From bed    Stand to Sit  5: Supervision;With upper extremity assist;To bed      Ambulation/Gait   Ambulation/Gait  Yes    Ambulation/Gait Assistance  4: Min guard    Ambulation/Gait Assistance Details  Improved pacing with gait with poles today,  continues to need cues for widened BOS.    Ambulation Distance (Feet)  225 Feet   x 2 with poles, 200 ft with U-step RW   Assistive device  --   bilateral walking poles, U-step RW   Gait Pattern  Step-through pattern;Decreased dorsiflexion - right;Decreased dorsiflexion - left;Scissoring;Narrow base of support;Decreased step length - left;Decreased weight shift to left;Decreased stance time - right    Ambulation Surface  Unlevel;Level;Outdoor;Indoor    Pre-Gait Activities  Pt brought in his own walking poles to use for outdoor gait today.    Gait Comments  Outdoor gait with walking poles, cues for posture, to look ahead for visual target for improved balance.  Seated rest break with outdoor gait.  With gait with U-step RW, cues for "walking like using walking poles", with noted improved posture and pt staying in closer proximity to walker.      Self-Care   Self-Care  Other Self-Care Comments    Other Self-Care Comments   Fall prevention education provided             PT Education -  12/08/17 2132    Education Details  Fall prevention education provided    Person(s) Educated  Patient;Spouse    Methods  Explanation;Demonstration    Comprehension  Verbalized understanding       PT Short Term Goals - 11/29/17 1950      PT SHORT TERM GOAL #1   Title  Pt will be independent with HEP to address balance, functional strengthening and gait.  TARGET:  11/25/17    Time  5    Period  Weeks    Status  On-going      PT SHORT TERM GOAL #2   Title  Pt will improve 5x sit<>stand score to less than or equal to 11.5 seconds (with UE support as needed), for improved transfer efficiency and safety.    Baseline  14.22 sec 11/24/17    Time  5    Period  Weeks    Status  Not Met      PT SHORT TERM GOAL #3   Title  Pt will improve TUG score to less than or equal to 18 seconds for decreased fall risk.    Time  5    Period  Weeks    Status  Achieved      PT SHORT TERM GOAL #4   Title  Pt will improve 6 MWT distance by 100 ft, with less than 2 points increase on pain scale for low back pain, for improved gait efficiency and safety.    Baseline  1063 ft in 6 MWT 10/27/17; unable to complete 6MWT, 3 minute walk 367 ft; 11/29/17 6 MWT 792 ft (~270 ft less than 8/29)    Time  5    Period  Weeks    Status  Not Met      PT SHORT TERM GOAL #5   Title  Berg Balance test to improve by at least 5 points, for decreased fall risk.    Baseline  Berg 25/56 10/27/17; 11/29/17  34/56    Time  5    Period  Weeks    Status  Achieved        PT Long Term Goals - 10/25/17 1215      PT LONG TERM GOAL #1   Title  Pt/family will verbalize understanding of fall prevention in home environment.  TARGET 12/23/17    Time  9    Period  Weeks  Status  New    Target Date  12/23/17      PT LONG TERM GOAL #2   Title  Pt will improve gait velocity to at least 2.5 ft/sec for improved gait efficiency and safety.    Time  9    Period  Weeks    Status  New    Target Date  12/23/17      PT LONG TERM GOAL  #3   Title  Pt will ambulate at least 300 ft, indoor/outdoor surfaces, with bilateral walking poles, with supervision, for pt's goal to negotiate short hike for 72th birthday.    Time  9    Period  Weeks    Status  New    Target Date  12/23/17      PT LONG TERM GOAL #4   Title  Pt will improve TUG cognitive score to less than or equal to 18 seconds for decreased fall risk/improved dual tasking with gait.    Time  5    Period  Weeks    Status  New    Target Date  12/23/17      PT LONG TERM GOAL #5   Title  Pt/family will verbalize techniques to reduce festinating with gait and turns.    Time  5    Period  Weeks    Status  New    Target Date  12/23/17            Plan - 12/08/17 2133    Clinical Impression Statement  Gait activities outdoors using pt's own walking poles today, with improved control of pace, still needs cues for widened BOS.  Pt not limited by pain as much with gait with use of walking poles, and he is requesting to sit prior to fatigue being a significant factor.  PT discussed/educated patient in how to monitor fatigue in legs and have more frequent seated breaks, with shorter bouts of walking. Pt able to carryover improved posture and closer proximity to U-step RW today.    Rehab Potential  Good    Clinical Impairments Affecting Rehab Potential  hx of multiple back surgeries, bilateral DBS, length of time since dx of Parkinson's; pt and wife are motivated for participation in therapy, pt active in community and exercise    PT Frequency  2x / week    PT Duration  Other (comment)   9 weeks   PT Treatment/Interventions  ADLs/Self Care Home Management;DME Instruction;Balance training;Therapeutic exercise;Therapeutic activities;Functional mobility training;Gait training;Neuromuscular re-education;Patient/family education    PT Next Visit Plan  Begin discharge discussion, as next week is week 8 of 9; posture and gait training with U-step RW and with walking poles;  managing U-step RW over ramp/threshold    Consulted and Agree with Plan of Care  Patient;Family member/caregiver    Family Member Consulted  wife       Patient will benefit from skilled therapeutic intervention in order to improve the following deficits and impairments:  Abnormal gait, Decreased balance, Decreased mobility, Decreased strength, Difficulty walking, Postural dysfunction, Decreased safety awareness  Visit Diagnosis: Other abnormalities of gait and mobility  Abnormal posture  Unsteadiness on feet     Problem List Patient Active Problem List   Diagnosis Date Noted  . 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 12/08/2017, 9:42 PM  Frazier Butt., PT   [facility] [address] [place], [place], [address] Phone: [phone]   Fax:  [phone]  Name: Patrick Marcotte  Ross MRN: [id number] Date of Birth: September 20, 1945

## 2017-12-09 ENCOUNTER — Ambulatory Visit: Payer: Medicare Other | Admitting: Occupational Therapy

## 2017-12-09 DIAGNOSIS — R29818 Other symptoms and signs involving the nervous system: Secondary | ICD-10-CM | POA: Diagnosis not present

## 2017-12-09 DIAGNOSIS — R41840 Attention and concentration deficit: Secondary | ICD-10-CM

## 2017-12-09 DIAGNOSIS — R29898 Other symptoms and signs involving the musculoskeletal system: Secondary | ICD-10-CM

## 2017-12-09 DIAGNOSIS — R278 Other lack of coordination: Secondary | ICD-10-CM

## 2017-12-09 DIAGNOSIS — M6281 Muscle weakness (generalized): Secondary | ICD-10-CM

## 2017-12-09 NOTE — Therapy (Signed)
[facility] [address] [place], [place], [address] Phone: [phone]   Fax:  [phone]  Occupational Therapy Treatment  Patient Details  Name: Patrick Ross MRN: [id number] Date of Birth: 1945/10/15 No data recorded  Encounter Date: 12/09/2017  OT End of Session - 12/09/17 1119    Visit Number  11    Number of Visits  17    Date for OT Re-Evaluation  12/24/17    Authorization Type  Medicare, T[employer] for Life    Authorization Time Period  cert. period 10/25/17-01/23/18    Authorization - Visit Number  11    Authorization - Number of Visits  20    OT Start Time  1107    OT Stop Time  1145    OT Time Calculation (min)  38 min    Activity Tolerance  Patient tolerated treatment well    Behavior During Therapy  WFL for tasks assessed/performed       Past Medical History:  Diagnosis Date  . GERD (gastroesophageal reflux disease)   . Hypertension   . Hypothyroidism   . Lumbar stenosis   . OSA (obstructive sleep apnea)   . Parkinson's disease ([place])   . REM behavioral disorder   . Urinary incontinence     Past Surgical History:  Procedure Laterality Date  . BACK SURGERY    . DEEP BRAIN STIMULATOR PLACEMENT     left 12/27/2012, right 12/10/2011, left revision 02/02/2013, replacement bilateral 11/21/2015  . KNEE SURGERY    . NEPHRECTOMY    . SPINAL FUSION    . TONSILLECTOMY      There were no vitals filed for this visit.  Subjective Assessment - 12/09/17 1117    Subjective   Pt reports it[birth year] his [age]    Patient Stated Goals  be able to tie both shoes consistently, be able to go on a hike in October     Currently in Pain?  Yes    Pain Score  2     Pain Location  Back   hip   Pain Orientation  Right    Pain Descriptors / Indicators  Aching    Pain Type  Chronic pain    Pain Onset  More than a month ago    Pain Frequency  Intermittent    Aggravating Factors   walking    Pain Relieving Factors   resting              Treatment: supine PWR!up, rock and twist, followed by seated closed chain chest press and shoulder flexion, min v.cancer for posture. Functional activities in standing to promote functional reach and upright posture. Pt was only able to stand for grossly 3-5 mins at a time prior to rest break. Arm bike x 6 mins level 3 for conditioning, pt maintained 40 rpm               OT Short Term Goals - 12/07/17 1724      OT SHORT TERM GOAL #1   Title  Pt will be independent with updated PD-specific HEP.--check STGs 11/24/17    Status  Achieved      OT SHORT TERM GOAL #2   Title  Pt will improve bilateral functional reaching/coordination for ADLs as shown by improving score on box and blocks test by at least 5 blocks bilaterally.    Status  Achieved   LUE 49, RUE 53      OT SHORT TERM GOAL #3  Title  Pt will be able to write address and 2 sentences with good legibility and only min decr in size.    Status  Achieved      OT SHORT TERM GOAL #4   Title  Pt will be able to tie both shoes mod I.    Status  Achieved        OT Long Term Goals - 10/25/17 1812      OT LONG TERM GOAL #1   Title  Pt will verbalize understanding of adaptive strategies for ADLs/IADLs to improve ease, movement quality, safety, and prevent future complications.--check LTGs 12/24/17    Time  8    Period  Weeks    Status  New      OT LONG TERM GOAL #2   Title  Pt will improve balance/functional reaching for IADLs as shown by reaching at least 10 inches with each UE without LOB.    Baseline  R-8", L-9" (with steps and close supervision)    Time  8    Period  Weeks    Status  New      OT LONG TERM GOAL #3   Title  Pt will be able to stand to complete grooming tasks at counter.    Time  8    Status  New      OT LONG TERM GOAL #4   Title  Pt will improve ability to dress as shown by fastening/unfastening 3 buttons in 46sec or less.    Baseline  56.50sec    Time  8     Period  Weeks    Status  New      OT LONG TERM GOAL #5   Title  Pt will improve dressing as shown by improving time on PPT#4 by at least 5sec with good balance/use of strategies.    Baseline  26.44sec    Time  8    Period  Weeks    Status  New      Long Term Additional Goals   Additional Long Term Goals  Yes      OT LONG TERM GOAL #6   Title  Pt will verbalize understanding of continuing fitness opportunities/appropriate community resources.    Baseline  --    Time  8    Period  Weeks    Status  New            Plan - 12/09/17 1313    Clinical Impression Statement  Pt is progressing towards goals however he continues to demonstrate limited standing tolerance and he continues to have back pain    Occupational performance deficits (Please refer to evaluation for details):  ADL's;IADL's;Leisure;Social Participation    Rehab Potential  Good    Current Impairments/barriers affecting progress:  hx of multiple back surgeries, severity of deficits    OT Frequency  2x / week    OT Duration  8 weeks    OT Treatment/Interventions  Self-care/ADL training;Cryotherapy;Paraffin;Therapeutic exercise;DME and/or AE instruction;Functional Mobility Training;Cognitive remediation/compensation;Balance training;Visual/perceptual remediation/compensation;Manual Therapy;Neuromuscular education;Fluidtherapy;Ultrasound;Moist Heat;Energy conservation;Passive range of motion;Therapeutic activities;Patient/family education    Plan  continue to work towards unmet goals.    Consulted and Agree with Plan of Care  Patient    Family Member Consulted  wife       Patient will benefit from skilled therapeutic intervention in order to improve the following deficits and impairments:  Decreased balance, Decreased mobility, Decreased endurance, Difficulty walking, Impaired vision/preception, Pain, Improper body mechanics, Impaired tone, Impaired perceived functional  ability, Decreased cognition, Decreased activity  tolerance, Decreased coordination, Decreased knowledge of use of DME, Decreased safety awareness, Impaired UE functional use, Improper spinal/pelvic alignment  Visit Diagnosis: Other symptoms and signs involving the nervous system  Other symptoms and signs involving the musculoskeletal system  Other lack of coordination  Attention and concentration deficit  Muscle weakness (generalized)    Problem List Patient Active Problem List   Diagnosis Date Noted  . 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 [place], [place], [address] Phone: [phone]   Fax:  [phone]  Name: KAMONTE MCMICHEN MRN: [id number] Date of Birth: 11-03-1945

## 2017-12-14 ENCOUNTER — Encounter: Payer: Self-pay | Admitting: Physical Therapy

## 2017-12-14 ENCOUNTER — Encounter: Payer: Self-pay | Admitting: Occupational Therapy

## 2017-12-14 ENCOUNTER — Ambulatory Visit: Payer: Medicare Other | Admitting: Occupational Therapy

## 2017-12-14 ENCOUNTER — Ambulatory Visit: Payer: Medicare Other

## 2017-12-14 ENCOUNTER — Ambulatory Visit: Payer: Medicare Other | Admitting: Physical Therapy

## 2017-12-14 VITALS — BP 160/93 | HR 93

## 2017-12-14 VITALS — BP 133/83 | HR 88

## 2017-12-14 DIAGNOSIS — R29818 Other symptoms and signs involving the nervous system: Secondary | ICD-10-CM

## 2017-12-14 DIAGNOSIS — R2689 Other abnormalities of gait and mobility: Secondary | ICD-10-CM

## 2017-12-14 DIAGNOSIS — R29898 Other symptoms and signs involving the musculoskeletal system: Secondary | ICD-10-CM

## 2017-12-14 DIAGNOSIS — R293 Abnormal posture: Secondary | ICD-10-CM

## 2017-12-14 DIAGNOSIS — R278 Other lack of coordination: Secondary | ICD-10-CM

## 2017-12-14 DIAGNOSIS — M6281 Muscle weakness (generalized): Secondary | ICD-10-CM

## 2017-12-14 DIAGNOSIS — R471 Dysarthria and anarthria: Secondary | ICD-10-CM

## 2017-12-14 DIAGNOSIS — R41844 Frontal lobe and executive function deficit: Secondary | ICD-10-CM

## 2017-12-14 DIAGNOSIS — R41840 Attention and concentration deficit: Secondary | ICD-10-CM

## 2017-12-14 DIAGNOSIS — R41841 Cognitive communication deficit: Secondary | ICD-10-CM

## 2017-12-14 DIAGNOSIS — R2681 Unsteadiness on feet: Secondary | ICD-10-CM

## 2017-12-14 NOTE — Therapy (Signed)
[facility] [address] [place], [place], [address] Phone: [phone]   Fax:  [phone]  Speech Language Pathology Treatment  Patient Details  Name: Patrick Ross MRN: [id number] Date of Birth: [birth year] Referring Provider (SLP): Tat, Wells Guiles   Encounter Date: 12/14/2017  End of Session - 12/14/17 1159    Visit Number  15    Number of Visits  17    Date for SLP Re-Evaluation  01/20/18    SLP Start Time  1105    SLP Stop Time   1147    SLP Time Calculation (min)  42 min    Activity Tolerance  Patient tolerated treatment well       Past Medical History:  Diagnosis Date  . GERD (gastroesophageal reflux disease)   . Hypertension   . Hypothyroidism   . Lumbar stenosis   . OSA (obstructive sleep apnea)   . Parkinson's disease ([place])   . REM behavioral disorder   . Urinary incontinence     Past Surgical History:  Procedure Laterality Date  . BACK SURGERY    . DEEP BRAIN STIMULATOR PLACEMENT     left 12/27/2012, right 12/10/2011, left revision 02/02/2013, replacement bilateral 11/21/2015  . KNEE SURGERY    . NEPHRECTOMY    . SPINAL FUSION    . TONSILLECTOMY      There were no vitals filed for this visit.  Subjective Assessment - 12/14/17 1112    Patient is accompained by:  Family member   [name], wife   Currently in Pain?  Yes    Pain Score  3     Pain Location  Back    Pain Orientation  Right    Pain Descriptors / Indicators  Aching    Pain Type  Chronic pain    Pain Onset  More than a month ago    Pain Frequency  Intermittent    Aggravating Factors   walking    Pain Relieving Factors  resting            ADULT SLP TREATMENT - 12/14/17 1113      General Information   Behavior/Cognition  Alert;Cooperative;Pleasant mood      Treatment Provided   Treatment provided  Cognitive-Linquistic      Cognitive-Linquistic Treatment   Treatment focused on  Dysarthria    Skilled Treatment  Pt's  wife stated pt "is so much better" with monitoring speech rate. Pt began therapy in 5 minute simple conversation with SLP with rapid speech necessitating SLP occasional nonverbal cue - which pt modified immediately due to cue. In two-three sentence responses with conversation-starter questions, pt req'd cues to reduce rate occasionally. SLP discussed the rate of cueing to reduce speech rate with wife and pt- discussed what frequency would be adequate and not frustrating for pt - pt/wife agreed upon once/hour, to begin and they will modify accordingly PRN.       Assessment / Recommendations / Plan   Plan  Continue with current plan of care      Progression Toward Goals   Progression toward goals  Progressing toward goals       SLP Education - 12/14/17 1158    Education Details  rate of nonverbal cueing to slow speech rate    Person(s) Educated  Patient    Methods  Explanation    Comprehension  Verbalized understanding       SLP Short Term Goals - 12/06/17 1305      SLP SHORT  TERM GOAL #1   Title  in sentence resposnes pt will achieve >90% intelligibility with self correction, over three sessions    Status  Partially Met      SLP SHORT TERM GOAL #2   Title  in 5 minutes simple conversation, pt will achieve 90% intelligibility with nonverbal cues    Status  Achieved      SLP SHORT TERM GOAL #3   Title  pt and/or wife will report med administration with puree as more successful than with H2O    Status  Deferred      SLP SHORT TERM GOAL #4   Title  pt and/or wife will tell SLP of 3 overt s/s aspiration PNA    Status  Deferred       SLP Long Term Goals - 12/14/17 1324      SLP LONG TERM GOAL #1   Title  pt will achieve 90% intelligibility in 10 minutes simple conversation with nonverbal cues, for three sessions    Status  Achieved      SLP LONG TERM GOAL #2   Title  in 5 minutes simple conversation pt will demo self correction for fast rate/unintelligible speech, to improve  intelligiblity to 90%, over three sessions    Status  Achieved      SLP LONG TERM GOAL #3   Title  pt and/or wife will report med administration with puree as more successful than with H2O    Time  1    Period  Weeks    Status  On-going      SLP LONG TERM GOAL #4   Title  in 8 minutes simple conversation pt will demo self correction for fast rate/unintelligible speech, to improve intelligiblity to 90%, over three sessions    Time  1    Period  Weeks    Status  New       Plan - 12/14/17 1323    Clinical Impression Statement  Pt presents today with 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 When provided non-verbal cue pt repeated phrase or sentence at an appropriate rate; continue targeting increasing self-correction.Pt would cont to benefit from skilled ST focusing on pt's goal of improving communicative ability with family and in the community.     Speech Therapy Frequency  2x / week    Duration  --   8 weeks/17 sessions   Treatment/Interventions  Aspiration precaution training;SLP instruction and feedback;Compensatory strategies;Patient/family education;Internal/external aids;Multimodal communcation approach;Functional tasks;Cognitive reorganization;Oral motor exercises    Potential to Achieve Goals  Fair    Potential Considerations  Severity of impairments;Ability to learn/carryover information    Consulted and Agree with Plan of Care  Patient;Other (Comment)    Family Member Consulted  wife [name]       Patient will benefit from skilled therapeutic intervention in order to improve the following deficits and impairments:   Dysarthria and anarthria  Cognitive communication deficit    Problem List Patient Active Problem List   Diagnosis Date Noted  . 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

## 2017-12-14 NOTE — Therapy (Signed)
[facility] [address] [place], [place], [address] Phone: [phone]   Fax:  [phone]  Occupational Therapy Treatment  Patient Details  Name: Patrick Ross MRN: [id number] Date of Birth: 1945-12-01 No data recorded  Encounter Date: 12/14/2017  OT End of Session - 12/14/17 1207    Visit Number  12    Number of Visits  17    Date for OT Re-Evaluation  12/24/17    Authorization Type  Medicare, Tricare for Life    Authorization Time Period  cert. period 10/25/17-01/23/18    Authorization - Visit Number  12    Authorization - Number of Visits  20    OT Start Time  1151    OT Stop Time  4431    OT Time Calculation (min)  43 min    Activity Tolerance  Patient tolerated treatment well    Behavior During Therapy  WFL for tasks assessed/performed       Past Medical History:  Diagnosis Date  . GERD (gastroesophageal reflux disease)   . Hypertension   . Hypothyroidism   . Lumbar stenosis   . OSA (obstructive sleep apnea)   . Parkinson's disease ([place])   . REM behavioral disorder   . Urinary incontinence     Past Surgical History:  Procedure Laterality Date  . BACK SURGERY    . DEEP BRAIN STIMULATOR PLACEMENT     left 12/27/2012, right 12/10/2011, left revision 02/02/2013, replacement bilateral 11/21/2015  . KNEE SURGERY    . NEPHRECTOMY    . SPINAL FUSION    . TONSILLECTOMY      Vitals:   12/14/17 1156  BP: 133/83  Pulse: 88    Subjective Assessment - 12/14/17 1152    Subjective   lightheaded today.  The girls came up with the grandchildren.  Pt reports headache, dizzy, wobbly.    Patient is accompained by:  Family member    Pertinent History  GERD, HTN, hypothyroidism, lumbar stenosis, OSA, REM behavioral disorder, Urinary incontinence, hx of multiple back surgeries (last 2018 lumbar fusion), s/p bilateral DBS ([id number],+[id number] 2014)     Patient Stated Goals  be able to tie both shoes consistently,  be able to go on a hike in October     Pain Onset  More than a month ago      Pt/wife instructed in CVA symptoms due to reports of dizziness, incr balance difficulties, and headache Sunday.  (Issued handout after review).  Recommended pt/wife BP regularly.  Also recommended pt discuss BP and symptoms with MD.  Pt instructed to call 911 if having symptoms (sudden worsening from baseline, particularly with headache, and/or high BP).  Pt wife verbalized understanding.  Also recommended pt discuss visual symptoms with Dr. Carles Collet.  Pt reports that he has seen a neuroophthalmologist in the past, but that it has been 4-5 years.  Suggested that pt discuss sea sickness bands for dizziness with MD and slow movements down.     Discussed/made recommendations for environmental modifications including use of painter's tape for visual cue to keep feet apart, identify problem areas/situations in the home and use sticky note cues for strategies.  Pt/wife verbalized understanding.  Sit>stand from table with mod cueing to scoot back first, feet apart, and forward lean.  Pt needed cueing and repetition, but was impulsive.    In standing, functional reaching with trunk rotation/wt shift for incr balance and standing tolerance with mod cues to keep feet apart, for posture, and  knee extension.  Pt fatigued quickly and needed rest breaks.    Standing, performing functional reaching with feet apart with min cues for incr standing tolerance.  Pt also with minor LOB backwards (holding onto walker) when looking up.  Pt cued/educated regarding impact of trunk when he looked up affecting balance.   Pt reports that he is able to stand at counter for grooming most days, but gets on knees some.  Recommended against going onto knees due to fall risk and recommended standing at counter with feet apart for incr standing tolerance (utilizing visual reminders).  Pt verbalized understanding.   Practiced buttoning/unbuttoning shirt on  table top with min cues for use of PWR! Hands prior to buttoning and use of deliberate/large amplitude movements after instruction.  Pt demo improvement with repetition and use of large amplitude movements.  Then pt fastened/unfastened buttons while wearing shirt with good performance.      OT Short Term Goals - 12/07/17 1724      OT SHORT TERM GOAL #1   Title  Pt will be independent with updated PD-specific HEP.--check STGs 11/24/17    Status  Achieved      OT SHORT TERM GOAL #2   Title  Pt will improve bilateral functional reaching/coordination for ADLs as shown by improving score on box and blocks test by at least 5 blocks bilaterally.    Status  Achieved   LUE 49, RUE 53      OT SHORT TERM GOAL #3   Title  Pt will be able to write address and 2 sentences with good legibility and only min decr in size.    Status  Achieved      OT SHORT TERM GOAL #4   Title  Pt will be able to tie both shoes mod I.    Status  Achieved        OT Long Term Goals - 10/25/17 1812      OT LONG TERM GOAL #1   Title  Pt will verbalize understanding of adaptive strategies for ADLs/IADLs to improve ease, movement quality, safety, and prevent future complications.--check LTGs 12/24/17    Time  8    Period  Weeks    Status  New      OT LONG TERM GOAL #2   Title  Pt will improve balance/functional reaching for IADLs as shown by reaching at least 10 inches with each UE without LOB.    Baseline  R-8", L-9" (with steps and close supervision)    Time  8    Period  Weeks    Status  New      OT LONG TERM GOAL #3   Title  Pt will be able to stand to complete grooming tasks at counter.    Time  8    Status  New      OT LONG TERM GOAL #4   Title  Pt will improve ability to dress as shown by fastening/unfastening 3 buttons in 46sec or less.    Baseline  56.50sec    Time  8    Period  Weeks    Status  New      OT LONG TERM GOAL #5   Title  Pt will improve dressing as shown by improving time on PPT#4  by at least 5sec with good balance/use of strategies.    Baseline  26.44sec    Time  8    Period  Weeks    Status  New  Long Term Additional Goals   Additional Long Term Goals  Yes      OT LONG TERM GOAL #6   Title  Pt will verbalize understanding of continuing fitness opportunities/appropriate community resources.    Baseline  --    Time  8    Period  Weeks    Status  New            Plan - 12/14/17 1207    Clinical Impression Statement  Pt is slowly progressing towards goals, but impulsivity, cognitive deficits, and visual deficits as well as back pain are limiting factors.  Pt encouraged to use environmental cues at home to reinforce strategies.    Occupational performance deficits (Please refer to evaluation for details):  ADL's;IADL's;Leisure;Social Participation    Rehab Potential  Good    Current Impairments/barriers affecting progress:  hx of multiple back surgeries, severity of deficits    OT Frequency  2x / week    OT Duration  8 weeks    OT Treatment/Interventions  Self-care/ADL training;Cryotherapy;Paraffin;Therapeutic exercise;DME and/or AE instruction;Functional Mobility Training;Cognitive remediation/compensation;Balance training;Visual/perceptual remediation/compensation;Manual Therapy;Neuromuscular education;Fluidtherapy;Ultrasound;Moist Heat;Energy conservation;Passive range of motion;Therapeutic activities;Patient/family education    Plan  begin checking LTGs for anticipated d/c next week    OT Home Exercise Plan  Education provided:  coordination HEP, writing strategies    Consulted and Agree with Plan of Care  Patient    Family Member Consulted  wife       Patient will benefit from skilled therapeutic intervention in order to improve the following deficits and impairments:  Decreased balance, Decreased mobility, Decreased endurance, Difficulty walking, Impaired vision/preception, Pain, Improper body mechanics, Impaired tone, Impaired perceived functional  ability, Decreased cognition, Decreased activity tolerance, Decreased coordination, Decreased knowledge of use of DME, Decreased safety awareness, Impaired UE functional use, Improper spinal/pelvic alignment  Visit Diagnosis: Other symptoms and signs involving the nervous system  Other symptoms and signs involving the musculoskeletal system  Other lack of coordination  Attention and concentration deficit  Muscle weakness (generalized)  Other abnormalities of gait and mobility  Abnormal posture  Unsteadiness on feet  Frontal lobe and executive function deficit    Problem List Patient Active Problem List   Diagnosis Date Noted  . 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 [place], [place], [address] Phone: [phone]   Fax:  [phone]  Name: Patrick Ross MRN: [id number] Date of Birth: May 05, 1945   Vianne Bulls, OTR/L [facility] [address] [place], [place]  [address] [phone] phone [phone] 12/14/17 1:40 PM

## 2017-12-14 NOTE — Patient Instructions (Signed)
We talked about rate of nonverbal cue to slow your rate - once/hour was agreed upon as a good starting place.

## 2017-12-15 NOTE — Therapy (Signed)
[facility] [address] [place], [place], [address] Phone: [phone]   Fax:  [phone]  Physical Therapy Treatment  Patient Details  Name: Patrick Ross MRN: [id number] Date of Birth: 05/12/1945 Referring Provider (PT): Dr. Wells Guiles Tat   Encounter Date: 12/14/2017  PT End of Session - 12/15/17 1221    Visit Number  14    Number of Visits  18    Date for PT Re-Evaluation  01/23/18    Authorization Type  Medicare, [employer] (Will need 10th visit progress notes)    PT Start Time  1019    PT Stop Time  1059    PT Time Calculation (min)  40 min    Equipment Utilized During Treatment  Gait belt    Activity Tolerance  Patient tolerated treatment well    Behavior During Therapy  [facility] for tasks assessed/performed       Past Medical History:  Diagnosis Date  . GERD (gastroesophageal reflux disease)   . Hypertension   . Hypothyroidism   . Lumbar stenosis   . OSA (obstructive sleep apnea)   . Parkinson's disease ([place])   . REM behavioral disorder   . Urinary incontinence     Past Surgical History:  Procedure Laterality Date  . BACK SURGERY    . DEEP BRAIN STIMULATOR PLACEMENT     left 12/27/2012, right 12/10/2011, left revision 02/02/2013, replacement bilateral 11/21/2015  . KNEE SURGERY    . NEPHRECTOMY    . SPINAL FUSION    . TONSILLECTOMY      Vitals:   12/14/17 1020  BP: (!) 160/93  Pulse: 93    Subjective Assessment - 12/14/17 1020    Subjective  Today's a better day than yesterday and the day before.  I had nausea and headache-mostly due to dizziness    Patient is accompained by:  Family member    Pertinent History  PD diagnosed 2000, R STN DBS 11/2011, L STN DBS 11/2012 with revision 01/2013; lumbar fusion November 22, 2016, back surgeries, HTN, OSA; dystonia (Botox scheduled in September); receives injections in hips    Limitations  Walking;Standing    Patient Stated Goals  Pt's goals  for physical therapy are to be able to go on camping trip in October (walk with walking poles).    Currently in Pain?  Yes    Pain Score  3     Pain Orientation  Right    Pain Descriptors / Indicators  Aching    Pain Type  Chronic pain    Pain Onset  More than a month ago    Pain Frequency  Intermittent    Aggravating Factors   walking    Pain Relieving Factors  resting    Pain Onset  More than a month ago                       [facility] Adult PT Treatment/Exercise - 12/15/17 1210      Ambulation/Gait   Ambulation/Gait  Yes    Ambulation/Gait Assistance  4: Min guard    Ambulation Distance (Feet)  230 Feet   x 2   Assistive device  --   U-step RW   Gait Pattern  Step-through pattern;Decreased dorsiflexion - right;Decreased dorsiflexion - left;Scissoring;Narrow base of support;Decreased step length - left;Decreased weight shift to left;Decreased stance time - right    Ambulation Surface  Level;Indoor    Pre-Gait Activities  Discussed and practiced techniques  to reduce festinating with gait and turns:  STOP, reset posture, and START again with BIG step length.      Gait Comments  Gait activities today focused on managing thresholds with U-step RW.  Worked on threshold areas in gym between carpet and tile with cues for proximity to walker and upright posture, momentum to clear.  Progressed to maneuvering U-step RW over 2" threshold in doorway (cues to stop, lift front of walker, push walker over threshold, then step over), then up onto red mat surfaces.  Cues for technique, for proximity of pt to walker and for upright posture.        High Level Balance   High Level Balance Activities  Turns    High Level Balance Comments  With U-step RW, worked on marching turns/weightshifting turns for improved foot clearance with tight turns, then wide walking turns with U-step RW      Self-Care   Self-Care  Other Self-Care Comments    Other Self-Care Comments   Discussed POC and likely  plans for discharge from PT next week; discussed limitations with addressing balance due to pt's continued pain.  Discussed pt's need to make sure he is performing exercises as part of HEP regularly at home (specifically ones that he has been given by previous PT clinic for his back).             PT Education - 12/15/17 1221    Education Details  POC, likely d/c next week; tips to reduce festination with gait and turns    Person(s) Educated  Patient;Spouse    Methods  Explanation;Demonstration    Comprehension  Verbalized understanding;Returned demonstration;Verbal cues required       PT Short Term Goals - 11/29/17 1950      PT SHORT TERM GOAL #1   Title  Pt will be independent with HEP to address balance, functional strengthening and gait.  TARGET:  11/25/17    Time  5    Period  Weeks    Status  On-going      PT SHORT TERM GOAL #2   Title  Pt will improve 5x sit<>stand score to less than or equal to 11.5 seconds (with UE support as needed), for improved transfer efficiency and safety.    Baseline  14.22 sec 11/24/17    Time  5    Period  Weeks    Status  Not Met      PT SHORT TERM GOAL #3   Title  Pt will improve TUG score to less than or equal to 18 seconds for decreased fall risk.    Time  5    Period  Weeks    Status  Achieved      PT SHORT TERM GOAL #4   Title  Pt will improve 6 MWT distance by 100 ft, with less than 2 points increase on pain scale for low back pain, for improved gait efficiency and safety.    Baseline  1063 ft in 6 MWT 10/27/17; unable to complete 6MWT, 3 minute walk 367 ft; 11/29/17 6 MWT 792 ft (~270 ft less than 8/29)    Time  5    Period  Weeks    Status  Not Met      PT SHORT TERM GOAL #5   Title  Berg Balance test to improve by at least 5 points, for decreased fall risk.    Baseline  Berg 25/56 10/27/17; 11/29/17  34/56    Time  5      Period  Weeks    Status  Achieved        PT Long Term Goals - 10/25/17 1215      PT LONG TERM GOAL #1    Title  Pt/family will verbalize understanding of fall prevention in home environment.  TARGET 12/23/17    Time  9    Period  Weeks    Status  New    Target Date  12/23/17      PT LONG TERM GOAL #2   Title  Pt will improve gait velocity to at least 2.5 ft/sec for improved gait efficiency and safety.    Time  9    Period  Weeks    Status  New    Target Date  12/23/17      PT LONG TERM GOAL #3   Title  Pt will ambulate at least 300 ft, indoor/outdoor surfaces, with bilateral walking poles, with supervision, for pt's goal to negotiate short hike for 72th birthday.    Time  9    Period  Weeks    Status  New    Target Date  12/23/17      PT LONG TERM GOAL #4   Title  Pt will improve TUG cognitive score to less than or equal to 18 seconds for decreased fall risk/improved dual tasking with gait.    Time  5    Period  Weeks    Status  New    Target Date  12/23/17      PT LONG TERM GOAL #5   Title  Pt/family will verbalize techniques to reduce festinating with gait and turns.    Time  5    Period  Weeks    Status  New    Target Date  12/23/17            Plan - 12/15/17 1222    Clinical Impression Statement  Addressed tips to reduce fesinating with gait and turns as well as threshold management with U-step RW today.  Pt takes brief, frequent seated rest breaks throughout session, with education being provided in POC and tips to reduce festination.    Rehab Potential  Good    Clinical Impairments Affecting Rehab Potential  hx of multiple back surgeries, bilateral DBS, length of time since dx of Parkinson's; pt and wife are motivated for participation in therapy, pt active in community and exercise    PT Frequency  2x / week    PT Duration  Other (comment)   9 weeks   PT Treatment/Interventions  ADLs/Self Care Home Management;DME Instruction;Balance training;Therapeutic exercise;Therapeutic activities;Functional mobility training;Gait training;Neuromuscular  re-education;Patient/family education    PT Next Visit Plan  ; posture and gait training with U-step RW and with walking poles; review managing U-step RW over ramp/threshold    Consulted and Agree with Plan of Care  Patient;Family member/caregiver    Family Member Consulted  wife       Patient will benefit from skilled therapeutic intervention in order to improve the following deficits and impairments:  Abnormal gait, Decreased balance, Decreased mobility, Decreased strength, Difficulty walking, Postural dysfunction, Decreased safety awareness  Visit Diagnosis: Other abnormalities of gait and mobility  Abnormal posture     Problem List Patient Active Problem List   Diagnosis Date Noted  . 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 12/15/2017, 12:24 PM  MARRIOTT,AMY W., PT   [facility] [address] [place], [place], [address] Phone: [phone]     Fax:  [phone]  Name: Ryelan C Tuggle MRN: [id number] Date of Birth: 06/06/1945   

## 2017-12-16 ENCOUNTER — Encounter: Payer: Self-pay | Admitting: Occupational Therapy

## 2017-12-16 ENCOUNTER — Ambulatory Visit: Payer: Medicare Other

## 2017-12-16 ENCOUNTER — Encounter: Payer: Self-pay | Admitting: Physical Therapy

## 2017-12-16 ENCOUNTER — Ambulatory Visit: Payer: Medicare Other | Admitting: Physical Therapy

## 2017-12-16 ENCOUNTER — Ambulatory Visit: Payer: Medicare Other | Admitting: Occupational Therapy

## 2017-12-16 DIAGNOSIS — R29818 Other symptoms and signs involving the nervous system: Secondary | ICD-10-CM

## 2017-12-16 DIAGNOSIS — R471 Dysarthria and anarthria: Secondary | ICD-10-CM

## 2017-12-16 DIAGNOSIS — R278 Other lack of coordination: Secondary | ICD-10-CM

## 2017-12-16 DIAGNOSIS — R29898 Other symptoms and signs involving the musculoskeletal system: Secondary | ICD-10-CM

## 2017-12-16 DIAGNOSIS — R293 Abnormal posture: Secondary | ICD-10-CM

## 2017-12-16 DIAGNOSIS — M6281 Muscle weakness (generalized): Secondary | ICD-10-CM

## 2017-12-16 DIAGNOSIS — R41841 Cognitive communication deficit: Secondary | ICD-10-CM

## 2017-12-16 DIAGNOSIS — R41840 Attention and concentration deficit: Secondary | ICD-10-CM

## 2017-12-16 DIAGNOSIS — R2689 Other abnormalities of gait and mobility: Secondary | ICD-10-CM

## 2017-12-16 NOTE — Therapy (Signed)
[facility] [address] [place], [place], [address] Phone: [phone]   Fax:  [phone]  Speech Language Pathology Treatment  Patient Details  Name: Patrick Ross MRN: [id number] Date of Birth: 11-26-1945 Referring Provider (SLP): Tat, Josephina Shih Date: 12/16/2017  End of Session - 12/16/17 1155    Visit Number  15    Number of Visits  17    Date for SLP Re-Evaluation  01/20/18    SLP Start Time  9163    SLP Stop Time   1231    SLP Time Calculation (min)  43 min    Activity Tolerance  Patient tolerated treatment well       Past Medical History:  Diagnosis Date  . GERD (gastroesophageal reflux disease)   . Hypertension   . Hypothyroidism   . Lumbar stenosis   . OSA (obstructive sleep apnea)   . Parkinson's disease ([place])   . REM behavioral disorder   . Urinary incontinence     Past Surgical History:  Procedure Laterality Date  . BACK SURGERY    . DEEP BRAIN STIMULATOR PLACEMENT     left 12/27/2012, right 12/10/2011, left revision 02/02/2013, replacement bilateral 11/21/2015  . KNEE SURGERY    . NEPHRECTOMY    . SPINAL FUSION    . TONSILLECTOMY      There were no vitals filed for this visit.  Subjective Assessment - 12/16/17 1153    Subjective  "It's my last one so I'm tired."    Patient is accompained by:  Family member   debbie   Currently in Pain?  Yes    Pain Score  2     Pain Location  Hip    Pain Orientation  Right    Pain Descriptors / Indicators  Aching    Pain Type  Chronic pain    Pain Onset  More than a month ago    Pain Frequency  Intermittent    Aggravating Factors   overuse/use    Pain Relieving Factors  stretching    Pain Onset  More than a month ago            ADULT SLP TREATMENT - 12/16/17 1155      General Information   Behavior/Cognition  Alert;Cooperative;Pleasant mood      Treatment Provided   Treatment provided  Cognitive-Linquistic       Cognitive-Linquistic Treatment   Treatment focused on  Dysarthria    Skilled Treatment  Initial conversaiton with SLP req'd min A occasionally from SLP to reduce rate. In semi-strucutred tasks requiring sentence responses, pt had 90% success with slowed rate. SLP asked pt questions between responses with varied success, however pt self-corrected x3 during these informal responses of 1-3 sentences. Pt and wife are thinking d/c next visit is reasonable at this time; wife and pt are satisfied with current progress.        Assessment / Recommendations / Plan   Plan  Continue with current plan of care   d/c next session     Progression Toward Goals   Progression toward goals  Progressing toward goals       SLP Education - 12/16/17 1306    Education Details  need to practice 6/7 days/week    Person(s) Educated  Patient;Spouse    Methods  Explanation    Comprehension  Verbalized understanding       SLP Short Term Goals - 12/06/17 1305      SLP  SHORT TERM GOAL #1   Title  in sentence resposnes pt will achieve >90% intelligibility with self correction, over three sessions    Status  Partially Met      SLP SHORT TERM GOAL #2   Title  in 5 minutes simple conversation, pt will achieve 90% intelligibility with nonverbal cues    Status  Achieved      SLP SHORT TERM GOAL #3   Title  pt and/or wife will report med administration with puree as more successful than with H2O    Status  Deferred      SLP SHORT TERM GOAL #4   Title  pt and/or wife will tell SLP of 3 overt s/s aspiration PNA    Status  Deferred       SLP Long Term Goals - 12/16/17 1309      SLP LONG TERM GOAL #1   Title  pt will achieve 90% intelligibility in 10 minutes simple conversation with nonverbal cues, for three sessions    Status  Achieved      SLP LONG TERM GOAL #2   Title  in 5 minutes simple conversation pt will demo self correction for fast rate/unintelligible speech, to improve intelligiblity to 90%, over  three sessions    Status  Achieved      SLP LONG TERM GOAL #3   Title  pt and/or wife will report med administration with puree as more successful than with H2O    Time  1    Period  Weeks    Status  On-going      SLP LONG TERM GOAL #4   Title  in 8 minutes simple conversation pt will demo self correction for fast rate/unintelligible speech, to improve intelligiblity to 90%, over three sessions    Baseline  12-16-17    Time  1    Period  Weeks    Status  On-going       Plan - 12/16/17 1307    Clinical Impression Statement  Pt presents today with 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 See "skilled intervention" for details. Pt/wife agree next session would be an appropriate d/c. Pt would cont to benefit from skilled ST focusing on pt's goal of improving communicative ability with family and in the community. Discharge likely next session.    Speech Therapy Frequency  2x / week    Duration  --   8 weeks/17 sessions   Treatment/Interventions  Aspiration precaution training;SLP instruction and feedback;Compensatory strategies;Patient/family education;Internal/external aids;Multimodal communcation approach;Functional tasks;Cognitive reorganization;Oral motor exercises    Potential to Achieve Goals  Fair    Potential Considerations  Severity of impairments;Ability to learn/carryover information    Consulted and Agree with Plan of Care  Patient;Other (Comment)    Family Member Consulted  wife [name]       Patient will benefit from skilled therapeutic intervention in order to improve the following deficits and impairments:   Dysarthria and anarthria  Cognitive communication deficit    Problem List Patient Active Problem List   Diagnosis Date Noted  . 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

## 2017-12-16 NOTE — Patient Instructions (Signed)
Performing Daily Activities with Big Movements  Pick at least one activity a day and perform with BIG, DELIBERATE movements/effort. This can make the activity easier and turn daily activities into exercise!  If you are standing during the activity, make sure to keep feet apart and stand with good/big/PWR! UP posture.  Examples:  Dressing - Push arms in sleeves,  push foot into pants, open hands to pull down shirt/put on socks/pull up pants  Bathing - Wash/dry with long strokes  Brushing your teeth - Big, slow movements  Cutting food - Long deliberate cuts  Opening jar/bottle - Move as much as you can with each turn  Picking up a cup/bottle - Open hand up big and get object all the way in palm  Hanging up clothes/getting clothes down from closet - Reach with big effort  Putting away groceries/dishes - Reach with big effort  Wiping counter/table - Move in big, long strokes  Stirring while cooking - Exaggerate movement  Cleaning windows - Move in big, long strokes  Sweeping - Move arms in big, long strokes  Vacuuming - Push with big movement  Folding clothes - Exaggerate arm movements  Washing car - Move in big, long strokes  Raking - Move arms in big, long strokes  Changing light bulb - Move as much as you can with each turn  Using a screwdriver - Move as much as you can with each turn  Walking into a store/restaurant - Walk with big steps, swing arms if able  Standing up from a chair/recliner/sofa - Scoot forward, lean forward, and stand with big effort

## 2017-12-16 NOTE — Therapy (Signed)
[facility] [address] [place], [place], [address] Phone: [phone]   Fax:  [phone]  Physical Therapy Treatment  Patient Details  Name: Patrick Ross MRN: [id number] Date of Birth: 11-07-1945 Referring Provider (PT): Dr. Wells Guiles Tat   Encounter Date: 12/16/2017  PT End of Session - 12/16/17 1329    Visit Number  15    Number of Visits  18    Date for PT Re-Evaluation  01/23/18    Authorization Type  Medicare, Tricare for Life (Will need 10th visit progress notes)    PT Start Time  1025    PT Stop Time  1103    PT Time Calculation (min)  38 min    Equipment Utilized During Treatment  Gait belt    Activity Tolerance  Patient tolerated treatment well    Behavior During Therapy  WFL for tasks assessed/performed       Past Medical History:  Diagnosis Date  . GERD (gastroesophageal reflux disease)   . Hypertension   . Hypothyroidism   . Lumbar stenosis   . OSA (obstructive sleep apnea)   . Parkinson's disease ([place])   . REM behavioral disorder   . Urinary incontinence     Past Surgical History:  Procedure Laterality Date  . BACK SURGERY    . DEEP BRAIN STIMULATOR PLACEMENT     left 12/27/2012, right 12/10/2011, left revision 02/02/2013, replacement bilateral 11/21/2015  . KNEE SURGERY    . NEPHRECTOMY    . SPINAL FUSION    . TONSILLECTOMY      There were no vitals filed for this visit.  Subjective Assessment - 12/16/17 1027    Subjective  Having less pain in back, but hip is bothering me this morning.  Did more walking with poles yesterday.    Patient is accompained by:  Family member    Pertinent History  PD diagnosed 2000, R STN DBS 11/2011, L STN DBS 11/2012 with revision 01/2013; lumbar fusion November 22, 2016, back surgeries, HTN, OSA; dystonia (Botox scheduled in September); receives injections in hips    Limitations  Walking;Standing    Patient Stated Goals  Pt's goals for physical  therapy are to be able to go on camping trip in October (walk with walking poles).    Currently in Pain?  Yes    Pain Score  2     Pain Location  Back    Pain Orientation  Right    Pain Descriptors / Indicators  Aching;Dull    Pain Onset  More than a month ago    Pain Frequency  Intermittent    Aggravating Factors   walking    Pain Relieving Factors  resting    Pain Onset  More than a month ago                       [facility] Adult PT Treatment/Exercise - 12/16/17 0001      Transfers   Transfers  Sit to Stand;Stand to Sit    Sit to Stand  5: Supervision;With upper extremity assist;From bed    Stand to Sit  5: Supervision;With upper extremity assist;To bed      Ambulation/Gait   Ambulation/Gait  Yes    Ambulation/Gait Assistance  4: Min guard;5: Supervision    Ambulation/Gait Assistance Details  Outdoor gait with bilateral walking poles, with PT requesting 2 seated rest breaks during gait.    Ambulation Distance (Feet)  300 Feet  then 100 ; outdoor gait with 2 seated rest breaks   Assistive device  --   U-step RW   Gait Pattern  Step-through pattern;Decreased dorsiflexion - right;Decreased dorsiflexion - left;Scissoring;Narrow base of support;Decreased step length - left;Decreased weight shift to left;Decreased stance time - right    Ambulation Surface  Level;Indoor    Pre-Gait Activities  Cues for widened BOS with walking poles    Gait Comments  Reviewed going over thresholds with rollator, including reminder to STOP and pick up the front of the walker, if the threshold seems to high, versus trying to keep pushing the walker over it (decreasing safety with gait).  Reviewed turning technique from last visit including marching turns and wide-step U-turns iwth rollator.      Exercises   Exercises  Knee/Hip      Knee/Hip Exercises: Stretches   Active Hamstring Stretch  Right;Left;3 reps;30 seconds   seated at U-step RW seat   Active Hamstring Stretch Limitations   Performed during seated breaks with gait.    Piriformis Stretch  Right;Left;1 rep;30 seconds   seated during walking rest break       Educated patient regarding gait that he needs to use U-step RW for primary means of mobility (for improved stability, safety and option for seat if seated rest break is needed).  Wife reports he has been using walking poles when she is not around and she is concerned.  PT recommends pt only use walking poles if he has close supervision and only for short bouts at a time, not meant to substitute walking poles for his U-step walker.     PT Education - 12/16/17 1328    Education Details  Seated stretches-piriformis and hamstring-benefits of performing these in addition to supine stretches    Person(s) Educated  Patient;Spouse    Methods  Explanation;Demonstration;Handout    Comprehension  Verbalized understanding;Returned demonstration;Verbal cues required       PT Short Term Goals - 11/29/17 1950      PT SHORT TERM GOAL #1   Title  Pt will be independent with HEP to address balance, functional strengthening and gait.  TARGET:  11/25/17    Time  5    Period  Weeks    Status  On-going      PT SHORT TERM GOAL #2   Title  Pt will improve 5x sit<>stand score to less than or equal to 11.5 seconds (with UE support as needed), for improved transfer efficiency and safety.    Baseline  14.22 sec 11/24/17    Time  5    Period  Weeks    Status  Not Met      PT SHORT TERM GOAL #3   Title  Pt will improve TUG score to less than or equal to 18 seconds for decreased fall risk.    Time  5    Period  Weeks    Status  Achieved      PT SHORT TERM GOAL #4   Title  Pt will improve 6 MWT distance by 100 ft, with less than 2 points increase on pain scale for low back pain, for improved gait efficiency and safety.    Baseline  1063 ft in 6 MWT 10/27/17; unable to complete 6MWT, 3 minute walk 367 ft; 11/29/17 6 MWT 792 ft (~270 ft less than 8/29)    Time  5    Period   Weeks    Status  Not Met  PT SHORT TERM GOAL #5   Title  Berg Balance test to improve by at least 5 points, for decreased fall risk.    Baseline  Berg 25/56 10/27/17; 11/29/17  34/56    Time  5    Period  Weeks    Status  Achieved        PT Long Term Goals - 10/25/17 1215      PT LONG TERM GOAL #1   Title  Pt/family will verbalize understanding of fall prevention in home environment.  TARGET 12/23/17    Time  9    Period  Weeks    Status  New    Target Date  12/23/17      PT LONG TERM GOAL #2   Title  Pt will improve gait velocity to at least 2.5 ft/sec for improved gait efficiency and safety.    Time  9    Period  Weeks    Status  New    Target Date  12/23/17      PT LONG TERM GOAL #3   Title  Pt will ambulate at least 300 ft, indoor/outdoor surfaces, with bilateral walking poles, with supervision, for pt's goal to negotiate short hike for 72th birthday.    Time  9    Period  Weeks    Status  New    Target Date  12/23/17      PT LONG TERM GOAL #4   Title  Pt will improve TUG cognitive score to less than or equal to 18 seconds for decreased fall risk/improved dual tasking with gait.    Time  5    Period  Weeks    Status  New    Target Date  12/23/17      PT LONG TERM GOAL #5   Title  Pt/family will verbalize techniques to reduce festinating with gait and turns.    Time  5    Period  Weeks    Status  New    Target Date  12/23/17            Plan - 12/16/17 1329    Clinical Impression Statement  Reviewed today turning strategies, managing thresholds with U-step RW and how he can incorporate seated stretches into his routine.  Pt continues to be limited in standing and gait activities, requesting to take frequent rest breaks (but pain reported at end of session is 2/10-unchanged from beginning of session).  Anticipate next visit being his last visit, with plans to check goals and discharge.    Rehab Potential  Good    Clinical Impairments Affecting Rehab  Potential  hx of multiple back surgeries, bilateral DBS, length of time since dx of Parkinson's; pt and wife are motivated for participation in therapy, pt active in community and exercise    PT Frequency  2x / week    PT Duration  Other (comment)   9 weeks   PT Treatment/Interventions  ADLs/Self Care Home Management;DME Instruction;Balance training;Therapeutic exercise;Therapeutic activities;Functional mobility training;Gait training;Neuromuscular re-education;Patient/family education    PT Next Visit Plan  Check LTGs and plan for discharge    Consulted and Agree with Plan of Care  Patient;Family member/caregiver    Family Member Consulted  wife       Patient will benefit from skilled therapeutic intervention in order to improve the following deficits and impairments:  Abnormal gait, Decreased balance, Decreased mobility, Decreased strength, Difficulty walking, Postural dysfunction, Decreased safety awareness  Visit Diagnosis: Other abnormalities of gait and  mobility  Abnormal posture     Problem List Patient Active Problem List   Diagnosis Date Noted  . 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 12/16/2017, 1:33 PM  Frazier Butt., PT  [facility] [address] [place], [place], [address] Phone: [phone]   Fax:  [phone]  Name: Patrick Ross MRN: [id number] Date of Birth: 1945-03-05

## 2017-12-16 NOTE — Therapy (Signed)
[facility] [address] [place], [place], [address] Phone: [phone]   Fax:  [phone]  Occupational Therapy Treatment  Patient Details  Name: Patrick Ross MRN: [id number] Date of Birth: Sep 18, 1945 No data recorded  Encounter Date: 12/16/2017  OT End of Session - 12/16/17 1328    Visit Number  13    Number of Visits  17    Authorization Type  Medicare, Tricare for Life    Authorization Time Period  cert. period 10/25/17-01/23/18    Authorization - Visit Number  13    Authorization - Number of Visits  20    OT Start Time  1105   2 units only, pt had to go to [place]   OT Stop Time  1145    OT Time Calculation (min)  40 min    Activity Tolerance  Patient tolerated treatment well    Behavior During Therapy  [facility] for tasks assessed/performed       Past Medical History:  Diagnosis Date  . GERD (gastroesophageal reflux disease)   . Hypertension   . Hypothyroidism   . Lumbar stenosis   . OSA (obstructive sleep apnea)   . Parkinson's disease ([place])   . REM behavioral disorder   . Urinary incontinence     Past Surgical History:  Procedure Laterality Date  . BACK SURGERY    . DEEP BRAIN STIMULATOR PLACEMENT     left 12/27/2012, right 12/10/2011, left revision 02/02/2013, replacement bilateral 11/21/2015  . KNEE SURGERY    . NEPHRECTOMY    . SPINAL FUSION    . TONSILLECTOMY      There were no vitals filed for this visit.  Subjective Assessment - 12/16/17 1328    Pertinent History  GERD, HTN, hypothyroidism, lumbar stenosis, OSA, REM behavioral disorder, Urinary incontinence, hx of multiple back surgeries (last 2018 lumbar fusion), s/p bilateral DBS ([id number],[id number],+ERDEYCXK 2014)     Patient Stated Goals  be able to tie both shoes consistently, be able to go on a hike in October     Currently in Pain?  No/denies            Treatment: Pt practiced cutting food using adapted techniques with improved success,  pt practiced going in/ out of bathroom door using walker, he required min v.c and supervision/ minguard for safe techniques. Pt can benefit from reinforcement.               OT Education - 12/16/17 1332    Education Details  HEP review, education regarding ways to prevent future complications and big movements with ADLs.    Person(s) Educated  Patient;Spouse    Methods  Explanation;Demonstration;Verbal cues    Comprehension  Verbalized understanding;Returned demonstration;Verbal cues required       OT Short Term Goals - 12/07/17 1724      OT SHORT TERM GOAL #1   Title  Pt will be independent with updated PD-specific HEP.--check STGs 11/24/17    Status  Achieved      OT SHORT TERM GOAL #2   Title  Pt will improve bilateral functional reaching/coordination for ADLs as shown by improving score on box and blocks test by at least 5 blocks bilaterally.    Status  Achieved   LUE 49, RUE 53      OT SHORT TERM GOAL #3   Title  Pt will be able to write address and 2 sentences with good legibility and only min decr in size.  Status  Achieved      OT SHORT TERM GOAL #4   Title  Pt will be able to tie both shoes mod I.    Status  Achieved        OT Long Term Goals - 12/16/17 1133      OT LONG TERM GOAL #1   Title  Pt will verbalize understanding of adaptive strategies for ADLs/IADLs to improve ease, movement quality, safety, and prevent future complications.--check LTGs 12/24/17    Status  Achieved   achieved, educated in big movments with ADLS, ways to prevent future complications     OT LONG TERM GOAL #2   Title  Pt will improve balance/functional reaching for IADLs as shown by reaching at least 10 inches with each UE without LOB.    Status  On-going      OT LONG TERM GOAL #3   Title  Pt will be able to stand to complete grooming tasks at counter.    Status  Achieved      OT LONG TERM GOAL #4   Title  Pt will improve ability to dress as shown by  fastening/unfastening 3 buttons in 46sec or less.    Status  On-going      OT LONG TERM GOAL #5   Title  Pt will improve dressing as shown by improving time on PPT#4 by at least 5sec with good balance/use of strategies.    Status  On-going      OT LONG TERM GOAL #6   Title  Pt will verbalize understanding of continuing fitness opportunities/appropriate community resources.    Status  On-going            Plan - 12/16/17 1330    Clinical Impression Statement  Pt is slowly progressing towards goals cognitive deficits, and back pain are limiting factors.    Occupational Profile and client history currently impacting functional performance  Pt is independent with BADLs, but needs assist for tying shoes at times.  Pt reports decrease in IADL performance (driving, community activities) and would like to return to hiking.  Pt reports decline, particularly after last back surgeries.      Occupational performance deficits (Please refer to evaluation for details):  ADL's;IADL's;Leisure;Social Participation    Rehab Potential  Good    Current Impairments/barriers affecting progress:  hx of multiple back surgeries, severity of deficits    OT Frequency  2x / week    OT Duration  8 weeks    OT Treatment/Interventions  Self-care/ADL training;Cryotherapy;Paraffin;Therapeutic exercise;DME and/or AE instruction;Functional Mobility Training;Cognitive remediation/compensation;Balance training;Visual/perceptual remediation/compensation;Manual Therapy;Neuromuscular education;Fluidtherapy;Ultrasound;Moist Heat;Energy conservation;Passive range of motion;Therapeutic activities;Patient/family education    Plan  provide info regarding community resources, check remaining goals anticipate d/c next week    Consulted and Agree with Plan of Care  Patient    Family Member Consulted  wife       Patient will benefit from skilled therapeutic intervention in order to improve the following deficits and impairments:   Decreased balance, Decreased mobility, Decreased endurance, Difficulty walking, Impaired vision/preception, Pain, Improper body mechanics, Impaired tone, Impaired perceived functional ability, Decreased cognition, Decreased activity tolerance, Decreased coordination, Decreased knowledge of use of DME, Decreased safety awareness, Impaired UE functional use, Improper spinal/pelvic alignment  Visit Diagnosis: Other symptoms and signs involving the nervous system  Other symptoms and signs involving the musculoskeletal system  Other lack of coordination  Attention and concentration deficit  Muscle weakness (generalized)    Problem List Patient Active Problem List  Diagnosis Date Noted  . 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 [place], [place], [address] Phone: [phone]   Fax:  [phone]  Name: DACOTA RUBEN MRN: [id number] Date of Birth: April 06, 1945

## 2017-12-16 NOTE — Patient Instructions (Signed)
Access Code: [facility]  URL: [email or web address]  Date: 12/16/2017  Prepared by: Mady Haagensen   Exercises  Seated Hamstring Stretch - 3 reps - 1 sets - 30 hold - 2-3x daily - 7x weekly  Seated Figure 4 Piriformis Stretch - 3 reps - 1 sets - 30 sec hold - 2-3x daily - 7x weekly

## 2017-12-19 ENCOUNTER — Ambulatory Visit: Payer: Medicare Other | Admitting: Physical Therapy

## 2017-12-19 ENCOUNTER — Encounter: Payer: Medicare Other | Admitting: Occupational Therapy

## 2017-12-20 ENCOUNTER — Ambulatory Visit: Payer: Medicare Other

## 2017-12-20 DIAGNOSIS — R471 Dysarthria and anarthria: Secondary | ICD-10-CM

## 2017-12-20 DIAGNOSIS — R29818 Other symptoms and signs involving the nervous system: Secondary | ICD-10-CM | POA: Diagnosis not present

## 2017-12-20 DIAGNOSIS — R41841 Cognitive communication deficit: Secondary | ICD-10-CM

## 2017-12-20 NOTE — Therapy (Signed)
[facility] [address] [place], [place], [address] Phone: [phone]   Fax:  [phone]  Speech Language Pathology Treatment  Patient Details  Name: Patrick Ross MRN: [id number] Date of Birth: 1945-12-07 Referring Provider (SLP): Alonza Bogus   Encounter Date: 12/20/2017  End of Session - 12/20/17 1623    Visit Number  16    Number of Visits  17    Date for SLP Re-Evaluation  01/20/18    SLP Start Time  1535    SLP Stop Time   2119    SLP Time Calculation (min)  32 min       Past Medical History:  Diagnosis Date  . GERD (gastroesophageal reflux disease)   . Hypertension   . Hypothyroidism   . Lumbar stenosis   . OSA (obstructive sleep apnea)   . Parkinson's disease ([place])   . REM behavioral disorder   . Urinary incontinence     Past Surgical History:  Procedure Laterality Date  . BACK SURGERY    . DEEP BRAIN STIMULATOR PLACEMENT     left 12/27/2012, right 12/10/2011, left revision 02/02/2013, replacement bilateral 11/21/2015  . KNEE SURGERY    . NEPHRECTOMY    . SPINAL FUSION    . TONSILLECTOMY      There were no vitals filed for this visit.  Subjective Assessment - 12/20/17 1538    Subjective  "Patrick Ross said that he oculd understand me much better now. It was uncolicited."    Patient is accompained by:  Family member   wife   Currently in Pain?  Yes    Pain Score  2     Pain Location  Hip    Pain Orientation  Right    Pain Descriptors / Indicators  Aching    Pain Type  Chronic pain    Pain Onset  More than a month ago    Pain Frequency  Intermittent    Aggravating Factors   use    Pain Relieving Factors  stretching            ADULT SLP TREATMENT - 12/20/17 1544      General Information   Behavior/Cognition  Alert;Cooperative;Pleasant mood      Treatment Provided   Treatment provided  Cognitive-Linquistic      Cognitive-Linquistic Treatment   Treatment focused on  Dysarthria     Skilled Treatment  SLP engaged pt in mod complex conversation today to assess/target slowed rate of speech and pt's ability to self correct. He req'd 5 cues in 35 minutes, and self corrected 4 times. Intelligibilty was 95%. Pt reported he has not attempted meds with puree as he has taken them with milk and has had no issue.      Assessment / Recommendations / Plan   Plan  Discharge SLP treatment due to (comment)   pt satisfied with current success     Progression Toward Goals   Progression toward goals  --   d/c - see summary        SLP Short Term Goals - 12/06/17 1305      SLP SHORT TERM GOAL #1   Title  in sentence resposnes pt will achieve >90% intelligibility with self correction, over three sessions    Status  Partially Met      SLP SHORT TERM GOAL #2   Title  in 5 minutes simple conversation, pt will achieve 90% intelligibility with nonverbal cues    Status  Achieved  SLP SHORT TERM GOAL #3   Title  pt and/or wife will report med administration with puree as more successful than with H2O    Status  Deferred      SLP SHORT TERM GOAL #4   Title  pt and/or wife will tell SLP of 3 overt s/s aspiration PNA    Status  Deferred       SLP Long Term Goals - 12/20/17 1604      SLP LONG TERM GOAL #1   Title  pt will achieve 90% intelligibility in 10 minutes simple conversation with nonverbal cues, for three sessions    Status  Achieved      SLP LONG TERM GOAL #2   Title  in 5 minutes simple conversation pt will demo self correction for fast rate/unintelligible speech, to improve intelligiblity to 90%, over three sessions    Status  Achieved      SLP LONG TERM GOAL #3   Title  pt and/or wife will report med administration with puree as more successful than with H2O    Status  Deferred      SLP LONG TERM GOAL #4   Title  in 8 minutes simple conversation pt will demo self correction for fast rate/unintelligible speech, to improve intelligiblity to 90%, over three  sessions    Baseline  --    Time  --    Period  --    Status  Partially Met       Plan - 12/20/17 1623    Clinical Impression Statement  Pt presents today with 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 See "skilled intervention" for details. Pt/wife agree with d/c today.     Speech Therapy Frequency  2x / week    Duration  --   8 weeks/17 sessions   Treatment/Interventions  Aspiration precaution training;SLP instruction and feedback;Compensatory strategies;Patient/family education;Internal/external aids;Multimodal communcation approach;Functional tasks;Cognitive reorganization;Oral motor exercises    Potential to Achieve Goals  Fair    Potential Considerations  Severity of impairments;Ability to learn/carryover information    Consulted and Agree with Plan of Care  Patient;Other (Comment)    Family Member Consulted  wife [name]       Patient will benefit from skilled therapeutic intervention in order to improve the following deficits and impairments:   Dysarthria and anarthria  Cognitive communication deficit   SPEECH THERAPY DISCHARGE SUMMARY  Visits from Start of Care: 16  Current functional level related to goals / functional outcomes: See goals. Pt made functional gains in reducing speech rate. He had unsolicited comments re: his rate change.   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   Plan: Patient agrees to discharge.  Patient goals were partially met. Patient is being discharged due to being pleased with the current functional level.  ?????       Problem List Patient Active Problem List   Diagnosis Date Noted  . 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 [place], [place], [address] Phone: [phone]   Fax:   [phone]   Name: Patrick Ross MRN: [id number] Date of Birth: 1945-05-18

## 2017-12-22 ENCOUNTER — Ambulatory Visit: Payer: Medicare Other | Admitting: Occupational Therapy

## 2017-12-22 ENCOUNTER — Encounter: Payer: Medicare Other | Admitting: Speech Pathology

## 2017-12-22 ENCOUNTER — Encounter: Payer: Self-pay | Admitting: Physical Therapy

## 2017-12-22 ENCOUNTER — Ambulatory Visit: Payer: Medicare Other | Admitting: Physical Therapy

## 2017-12-22 DIAGNOSIS — R29818 Other symptoms and signs involving the nervous system: Secondary | ICD-10-CM | POA: Diagnosis not present

## 2017-12-22 DIAGNOSIS — R278 Other lack of coordination: Secondary | ICD-10-CM

## 2017-12-22 DIAGNOSIS — R29898 Other symptoms and signs involving the musculoskeletal system: Secondary | ICD-10-CM

## 2017-12-22 DIAGNOSIS — R2689 Other abnormalities of gait and mobility: Secondary | ICD-10-CM

## 2017-12-22 DIAGNOSIS — R41840 Attention and concentration deficit: Secondary | ICD-10-CM

## 2017-12-22 DIAGNOSIS — R2681 Unsteadiness on feet: Secondary | ICD-10-CM

## 2017-12-22 DIAGNOSIS — M6281 Muscle weakness (generalized): Secondary | ICD-10-CM

## 2017-12-22 DIAGNOSIS — R293 Abnormal posture: Secondary | ICD-10-CM

## 2017-12-22 DIAGNOSIS — R41844 Frontal lobe and executive function deficit: Secondary | ICD-10-CM

## 2017-12-22 NOTE — Therapy (Signed)
[facility] [address] [place], [place], [address] Phone: [phone]   Fax:  [phone]  Occupational Therapy Treatment  Patient Details  Name: Patrick Ross MRN: [id number] Date of Birth: January 03, 1946 No data recorded  Encounter Date: 12/22/2017  OT End of Session - 12/22/17 1438    Visit Number  14    Number of Visits  17    Date for OT Re-Evaluation  12/24/17    Authorization Type  Medicare, T[employer] for Life    Authorization Time Period  cert. period 10/25/17-01/23/18    Authorization - Visit Number  14    Authorization - Number of Visits  20    OT Start Time  1409   2 units, d/c visit   OT Stop Time  1440    OT Time Calculation (min)  31 min    Activity Tolerance  Patient tolerated treatment well    Behavior During Therapy  WFL for tasks assessed/performed       Past Medical History:  Diagnosis Date  . GERD (gastroesophageal reflux disease)   . Hypertension   . Hypothyroidism   . Lumbar stenosis   . OSA (obstructive sleep apnea)   . Parkinson's disease ([place])   . REM behavioral disorder   . Urinary incontinence     Past Surgical History:  Procedure Laterality Date  . BACK SURGERY    . DEEP BRAIN STIMULATOR PLACEMENT     left 12/27/2012, right 12/10/2011, left revision 02/02/2013, replacement bilateral 11/21/2015  . KNEE SURGERY    . NEPHRECTOMY    . SPINAL FUSION    . TONSILLECTOMY      There were no vitals filed for this visit.  Subjective Assessment - 12/22/17 1437    Subjective   Pt agrees with d/c today    Pertinent History  GERD, HTN, hypothyroidism, lumbar stenosis, OSA, REM behavioral disorder, Urinary incontinence, hx of multiple back surgeries (last 2018 lumbar fusion), s/p bilateral DBS ([id number],+[id number] 2014)     Limitations  fall risk, bilateral deep brain stimulator (DBS), hx of multiple spinal surgeries     Patient Stated Goals  be able to tie both shoes consistently, be able  to go on a hike in October     Currently in Pain?  Yes    Pain Score  2     Pain Orientation  Right    Pain Descriptors / Indicators  Aching    Pain Type  Chronic pain    Pain Onset  More than a month ago    Pain Frequency  Intermittent    Aggravating Factors   use    Pain Relieving Factors  stretching    Multiple Pain Sites  No              Treatment: Therapist checked progress towards patient goals and discussed with pt/ wife. Therapist made recommendations regarding appropriate community fitness and community activities. Pt verbalized understanding of all education. Arm bike x 6 mins level 3 for conditioning, pt maintained 50-60 rpm.               OT Short Term Goals - 12/22/17 1410      OT SHORT TERM GOAL #1   Title  Pt will be independent with updated PD-specific HEP.--check STGs 11/24/17    Status  Achieved      OT SHORT TERM GOAL #2   Title  Pt will improve bilateral functional reaching/coordination for ADLs as shown by improving score  on box and blocks test by at least 5 blocks bilaterally.    Status  Achieved      OT SHORT TERM GOAL #3   Title  Pt will be able to write address and 2 sentences with good legibility and only min decr in size.    Status  Achieved      OT SHORT TERM GOAL #4   Title  Pt will be able to tie both shoes mod I.    Status  Achieved        OT Long Term Goals - 12/22/17 1411      OT LONG TERM GOAL #1   Title  Pt will verbalize understanding of adaptive strategies for ADLs/IADLs to improve ease, movement quality, safety, and prevent future complications.--check LTGs 12/24/17    Status  Achieved      OT LONG TERM GOAL #2   Title  Pt will improve balance/functional reaching for IADLs as shown by reaching at least 10 inches with each UE without LOB.    Status  Not Met   9", bilaterally     OT LONG TERM GOAL #3   Title  Pt will be able to stand to complete grooming tasks at counter.    Status  Achieved      OT LONG TERM  GOAL #4   Title  Pt will improve ability to dress as shown by fastening/unfastening 3 buttons in 46sec or less.    Status  Achieved   23.95 with pt weraing shirt     OT LONG TERM GOAL #5   Title  Pt will improve dressing as shown by improving time on PPT#4 by at least 5sec with good balance/use of strategies.    Status  Achieved   18.72 secs     OT LONG TERM GOAL #6   Title  Pt will verbalize understanding of continuing fitness opportunities/appropriate community resources.    Status  Achieved            Plan - 12/22/17 1559    Clinical Impression Statement  Pt made good overall progress. Therapist checked goals and discussed overall progress with pt/ wife.    Occupational Profile and client history currently impacting functional performance  Pt is independent with BADLs, but needs assist for tying shoes at times.  Pt reports decrease in IADL performance (driving, community activities) and would like to return to hiking.  Pt reports decline, particularly after last back surgeries.      Occupational performance deficits (Please refer to evaluation for details):  ADL's;IADL's;Leisure;Social Participation    Rehab Potential  Good    Current Impairments/barriers affecting progress:  hx of multiple back surgeries, severity of deficits    OT Frequency  2x / week    OT Duration  8 weeks    OT Treatment/Interventions  Self-care/ADL training;Cryotherapy;Paraffin;Therapeutic exercise;DME and/or AE instruction;Functional Mobility Training;Cognitive remediation/compensation;Balance training;Visual/perceptual remediation/compensation;Manual Therapy;Neuromuscular education;Fluidtherapy;Ultrasound;Moist Heat;Energy conservation;Passive range of motion;Therapeutic activities;Patient/family education    Plan  d/c OT pt to return for screens in approx 7 mons.    OT Home Exercise Plan  Education provided:  coordination HEP, writing strategies    Consulted and Agree with Plan of Care  Patient    Family  Member Consulted  wife       Patient will benefit from skilled therapeutic intervention in order to improve the following deficits and impairments:  Decreased balance, Decreased mobility, Decreased endurance, Difficulty walking, Impaired vision/preception, Pain, Improper body mechanics, Impaired tone, Impaired perceived functional ability,  Decreased cognition, Decreased activity tolerance, Decreased coordination, Decreased knowledge of use of DME, Decreased safety awareness, Impaired UE functional use, Improper spinal/pelvic alignment  Visit Diagnosis: Other symptoms and signs involving the nervous system  Other symptoms and signs involving the musculoskeletal system  Other lack of coordination  Attention and concentration deficit  Muscle weakness (generalized)  Other abnormalities of gait and mobility  Abnormal posture  Frontal lobe and executive function deficit   OCCUPATIONAL THERAPY DISCHARGE SUMMARY    Current functional level related to goals / functional outcomes: Pt made good overall progress towards goals. Pt/ wife verbalize understanding.   Remaining deficits: Bradykinesia, back pain, rigidity,decreased balance, cognitive deficits   Education / Equipment: Pt was educated regarding: adapted strategies for ADLS, HEP and appropriate community resources/ fitness opportunities. Pt/ wife verbalize understanding of all education.  Plan: Patient agrees to discharge.  Patient goals were partially met. Patient is being discharged due to being pleased with the current functional level.  ?????     Problem List Patient Active Problem List   Diagnosis Date Noted  . 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 [place], [place], [address] Phone: [phone]   Fax:  [phone]  Name:  Patrick Ross MRN: [id number] Date of Birth: Nov 17, 1945

## 2017-12-22 NOTE — Therapy (Signed)
[facility] [address] [place], [place], [address] Phone: [phone]   Fax:  [phone]  Physical Therapy Treatment  Patient Details  Name: Patrick Ross MRN: [id number] Date of Birth: 12-24-1945 Referring Provider (PT): Dr. Wells Guiles Tat   Encounter Date: 12/22/2017  PT End of Session - 12/22/17 2254    Visit Number  16    Number of Visits  18    Date for PT Re-Evaluation  01/23/18    Authorization Type  Medicare, Tricare for Life (Will need 10th visit progress notes)    PT Start Time  1320    PT Stop Time  1400    PT Time Calculation (min)  40 min    Equipment Utilized During Treatment  Gait belt    Activity Tolerance  Patient tolerated treatment well    Behavior During Therapy  [facility] for tasks assessed/performed       Past Medical History:  Diagnosis Date  . GERD (gastroesophageal reflux disease)   . Hypertension   . Hypothyroidism   . Lumbar stenosis   . OSA (obstructive sleep apnea)   . Parkinson's disease ([place])   . REM behavioral disorder   . Urinary incontinence     Past Surgical History:  Procedure Laterality Date  . BACK SURGERY    . DEEP BRAIN STIMULATOR PLACEMENT     left 12/27/2012, right 12/10/2011, left revision 02/02/2013, replacement bilateral 11/21/2015  . KNEE SURGERY    . NEPHRECTOMY    . SPINAL FUSION    . TONSILLECTOMY      There were no vitals filed for this visit.  Subjective Assessment - 12/22/17 1324    Subjective  No changes since last visit.    Patient is accompained by:  Family member    Pertinent History  PD diagnosed 2000, R STN DBS 11/2011, L STN DBS 11/2012 with revision 01/2013; lumbar fusion November 22, 2016, back surgeries, HTN, OSA; dystonia (Botox scheduled in September); receives injections in hips    Limitations  Walking;Standing    Patient Stated Goals  Pt's goals for physical therapy are to be able to go on camping trip in October (walk with walking  poles).    Currently in Pain?  Yes    Pain Score  2     Pain Location  Hip    Pain Orientation  Right    Pain Descriptors / Indicators  Aching    Pain Type  Chronic pain    Pain Onset  More than a month ago    Pain Frequency  Intermittent    Aggravating Factors   use    Pain Relieving Factors  stretching    Pain Onset  More than a month ago                       [facility] Adult PT Treatment/Exercise - 12/22/17 1338      Ambulation/Gait   Ambulation/Gait  Yes    Ambulation/Gait Assistance  4: Min guard;5: Supervision    Ambulation/Gait Assistance Details  Outdoor gait with walking poles with pt needing 1 seated rest break.  PT provides cues for widened BOS, as pt has narrowed, scissoring BOS with walking poles today.  Discussed how he would take breaks on upcoming camping trip where he will be using walking poles.    Ambulation Distance (Feet)  400 Feet   outdoors, 100 ft,115 ft x 2 indoors U-step   Assistive device  --  U-step RW, bilateral walking poles   Gait Pattern  Step-through pattern;Decreased dorsiflexion - right;Decreased dorsiflexion - left;Scissoring;Narrow base of support;Decreased step length - left;Decreased weight shift to left;Decreased stance time - right    Ambulation Surface  Level;Unlevel;Indoor;Outdoor    Gait velocity  2.02 ft/sec rollator    Gait Comments  Reviewed tips to reduce festinating with gait-pt needs reminder cues for tips for turning, for stopping to reset posture and step length once festination begins. Reviewed fall prevention education.  Recommended continued primary use of rollator walker for optimal safety with gait.  Recommended walking pole use only if family/friend providing close supervision.  Recommend avoid walking with no assistive device due to high risk of falling.      Self-Care   Self-Care  Other Self-Care Comments    Other Self-Care Comments   Reviewed tips to reduce festinating episodes with gait;               PT Education - 12/22/17 2253    Education Details  Reviewed fall prevention, tips to reduce festination with gait; POC and plans for d/c this visit; plan for return screen in 6-9 months    Person(s) Educated  Patient;Spouse    Methods  Explanation    Comprehension  Verbalized understanding       PT Short Term Goals - 11/29/17 1950      PT SHORT TERM GOAL #1   Title  Pt will be independent with HEP to address balance, functional strengthening and gait.  TARGET:  11/25/17    Time  5    Period  Weeks    Status  On-going      PT SHORT TERM GOAL #2   Title  Pt will improve 5x sit<>stand score to less than or equal to 11.5 seconds (with UE support as needed), for improved transfer efficiency and safety.    Baseline  14.22 sec 11/24/17    Time  5    Period  Weeks    Status  Not Met      PT SHORT TERM GOAL #3   Title  Pt will improve TUG score to less than or equal to 18 seconds for decreased fall risk.    Time  5    Period  Weeks    Status  Achieved      PT SHORT TERM GOAL #4   Title  Pt will improve 6 MWT distance by 100 ft, with less than 2 points increase on pain scale for low back pain, for improved gait efficiency and safety.    Baseline  1063 ft in 6 MWT 10/27/17; unable to complete 6MWT, 3 minute walk 367 ft; 11/29/17 6 MWT 792 ft (~270 ft less than 8/29)    Time  5    Period  Weeks    Status  Not Met      PT SHORT TERM GOAL #5   Title  Berg Balance test to improve by at least 5 points, for decreased fall risk.    Baseline  Berg 25/56 10/27/17; 11/29/17  34/56    Time  5    Period  Weeks    Status  Achieved        PT Long Term Goals - 12/22/17 1328      PT LONG TERM GOAL #1   Title  Pt/family will verbalize understanding of fall prevention in home environment.  TARGET 12/23/17    Time  9    Period  Weeks  Status  Achieved      PT LONG TERM GOAL #2   Title  Pt will improve gait velocity to at least 2.5 ft/sec for improved gait efficiency and safety.     Baseline  2.02 ft/sec 12/22/17 with good technique at walker    Time  9    Period  Weeks    Status  Not Met      PT LONG TERM GOAL #3   Title  Pt will ambulate at least 300 ft, indoor/outdoor surfaces, with bilateral walking poles, with supervision, for pt's goal to negotiate short hike for 72th birthday.    Time  9    Period  Weeks    Status  Achieved      PT LONG TERM GOAL #4   Title  Pt will improve TUG cognitive score to less than or equal to 18 seconds for decreased fall risk/improved dual tasking with gait.    Baseline  16.31 sec 12/22/17    Time  5    Period  Weeks    Status  Achieved      PT LONG TERM GOAL #5   Title  Pt/family will verbalize techniques to reduce festinating with gait and turns.    Baseline  PT has educated pt/pt unable to fully verbalize techniques and strategies to reduce festination with gait.    Time  5    Period  Weeks    Status  Partially Met            Plan - 12/22/17 2254    Clinical Impression Statement  Assessed LTGs this visit, with pt meeting LTG 2, 3 and 4.  Pt has not met LTG 2 for improved gait velocity and has partially met LTG 5 for understanding tips to reduce festination with gait.  Pt has been able to progress to using bilateral walking poles on outdoor surfaces with min guard/supervision, and plans to use them on upcoming camping trip.  Extensive education provided on need for close supervision when using walking poles and U-step rollator should be primary means for independent mobility.  Pt has been limited somewhat with gait by longstanding hip pain and by lower extremity weakness.  Pt is appropriate for d/c at this time.    Rehab Potential  Good    Clinical Impairments Affecting Rehab Potential  hx of multiple back surgeries, bilateral DBS, length of time since dx of Parkinson's; pt and wife are motivated for participation in therapy, pt active in community and exercise    PT Frequency  2x / week    PT Duration  Other (comment)    9 weeks   PT Treatment/Interventions  ADLs/Self Care Home Management;DME Instruction;Balance training;Therapeutic exercise;Therapeutic activities;Functional mobility training;Gait training;Neuromuscular re-education;Patient/family education    PT Next Visit Plan  Discharge this visit.    Consulted and Agree with Plan of Care  Patient;Family member/caregiver    Family Member Consulted  wife       Patient will benefit from skilled therapeutic intervention in order to improve the following deficits and impairments:  Abnormal gait, Decreased balance, Decreased mobility, Decreased strength, Difficulty walking, Postural dysfunction, Decreased safety awareness  Visit Diagnosis: Other abnormalities of gait and mobility  Unsteadiness on feet  Other symptoms and signs involving the nervous system     Problem List Patient Active Problem List   Diagnosis Date Noted  . 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 12/22/2017, 10:59 PM  Frazier Butt., PT   [facility]  [facility] [address] [place], [place], [address] Phone: [phone]   Fax:  [phone]  Name: TIANDRE TEALL MRN: [id number] Date of Birth: April 14, 1945  PHYSICAL THERAPY DISCHARGE SUMMARY  Visits from Start of Care: 16  Current functional level related to goals / functional outcomes: PT Long Term Goals - 12/22/17 1328      PT LONG TERM GOAL #1   Title  Pt/family will verbalize understanding of fall prevention in home environment.  TARGET 12/23/17    Time  9    Period  Weeks    Status  Achieved      PT LONG TERM GOAL #2   Title  Pt will improve gait velocity to at least 2.5 ft/sec for improved gait efficiency and safety.    Baseline  2.02 ft/sec 12/22/17 with good technique at walker    Time  9    Period  Weeks    Status  Not Met      PT LONG TERM GOAL #3   Title  Pt will ambulate at least 300 ft, indoor/outdoor surfaces, with bilateral walking poles,  with supervision, for pt's goal to negotiate short hike for 72th birthday.    Time  9    Period  Weeks    Status  Achieved      PT LONG TERM GOAL #4   Title  Pt will improve TUG cognitive score to less than or equal to 18 seconds for decreased fall risk/improved dual tasking with gait.    Baseline  16.31 sec 12/22/17    Time  5    Period  Weeks    Status  Achieved      PT LONG TERM GOAL #5   Title  Pt/family will verbalize techniques to reduce festinating with gait and turns.    Baseline  PT has educated pt/pt unable to fully verbalize techniques and strategies to reduce festination with gait.    Time  5    Period  Weeks    Status  Partially Met         Remaining deficits: Pain, fatigue, posture, balance   Education / Equipment: Educated in [employer], fall prevention, tips to reduce festination with gait.  Plan: Patient agrees to discharge.  Patient goals were partially met. Patient is being discharged due to being pleased with the current functional level.  ?????Plan for return PD screen in 6-9 months.    Mady Haagensen, PT 12/22/17 11:01 PM Phone: [phone] Fax: [phone]

## 2017-12-26 MED ORDER — AMANTADINE HCL 100 MG PO CAPS: 100.0000 mg | ORAL_CAPSULE | Freq: Three times a day (TID) | ORAL | 0 refills | Status: DC

## 2018-01-13 NOTE — Progress Notes (Signed)
Patrick Ross was seen today in the movement disorders clinic for neurologic consultation at the request of Jefm Petty, MD. This patient is accompanied in the office by his spouse who supplements the history. The consultation is for the evaluation of PD.  Multiple medical records are reviewed regarding the patient's history.  He has been under the care of Dr. Linus Mako.  The patient last saw him on May 02, 2017.  The patient was diagnosed with Parkinson's disease in approximately 2000.  First sx was decreased L arm swing.    The patient underwent right STN DBS on December 10, 2011 and a left STN DBS on December 27, 2012.  Postoperatively, his left STN DBS lead was found to be displaced and had revision of the left lead on February 02, 2013.  Pt thinks that the L side (right body) has not been as effective as still has curling of the foot, even with the botox.    Patient's current movement disorder medications: Stalevo 200 mg 4 times per day (7/11/3/7) On carbidopa/levodopa 25/100 1 tablet as needed 3 times per day (7/11/3pm) And carbidopa/levodopa 50/200 at bedtime Amantadine 100 mg 3 times per day Clonazepam 0.5 mg at bedtime (for sleep but cpap has helped a lot.  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  Wife states that surgery did help with dyskinesia and tremor.    Most recent notes from Dr. Deboraha Sprang indicate that patients biggest issues now are things that are unresponsive to medications.  He has fatigue, apathy, gait imbalance and profound speech change.  He doesn't think that PD has contributed to speech change.   Specific Symptoms:  Tremor: No., not now.  Early on had a little R hand tremor Family hx of similar:  No. Voice: trouble with clarity, volume, word finding.  Last saw ST several years ago Sleep: sleeping better with CPAP  Vivid Dreams:  Yes.    Acting out dreams:  Yes.   Postural symptoms:   Yes.  , last PT years ago.  Does [employer] cycle class  Falls?  Yes.  , but felt due to having wrong walker  Bradykinesia symptoms: difficulty getting out of a chair (has lift chair at home); some shuffling of the feet Loss of smell:  Yes.   Loss of taste:  No. Urinary Incontinence:  No. Difficulty Swallowing:  Yes.  , just minor with dry food Handwriting, micrographia: Yes.   Trouble with ADL's:  No.  Trouble buttoning clothing: No. Depression:  No. but some motivation issues Memory changes:  Yes.  , some short term issues.  Very little driving now.  Drives to [employer] and [employer].  May drift to the left.  Uses alarm as reminder to take meds Hallucinations:  No.  visual distortions: No. N/V:  No. Lightheaded:  Yes.   , occasionally  Syncope: No. Diplopia:  No., some blurry Dyskinesia:  Yes.     01/16/18 update: Patient is seen today in follow-up.  He had an MRI of the brain since our last visit.  Left DBS lead looked to be several mm deeper than the R lead.  Other records have been reviewed since our last visit as well.  He was referred to Dr. Letta Pate for Botox injections for foot dystonia.  He saw Dr. Letta Pate on September 20, 2017.  He states that he had the injections and it helped.  He has also had injections  for SI joint pain and found those beneficial.  He has another appt on 01/30/18 for his Botox.   Patient has been going faithfully to the neuro rehab center for PT/OT/ST.  He thinks that is helping esp his speech.  No falls since our last visit.   Current movement disorder medications include: Stalevo 200 mg 4 times per day (7/11/3/7) On carbidopa/levodopa 25/100 1 tablet as needed 3 times per day (7/11/3pm) carbidopa/levodopa 50/200 at bedtime Amantadine 100 mg 3 times per day Clonazepam 0.5 mg at bedtime    PREVIOUS MEDICATIONS: none to date  ALLERGIES:   Allergies  Allergen Reactions  . Other Other (See Comments)    Surgical spray prior to adhesive causes blisters  .  Sulfamethoxazole Other (See Comments)    Unsure--childhood allergy    CURRENT MEDICATIONS:  Outpatient Encounter Medications as of 01/16/2018  Medication Sig  . acetaminophen (TYLENOL) 325 MG tablet Take by mouth as needed.  Marland Kitchen amantadine (SYMMETREL) 100 MG capsule Take 1 capsule (100 mg total) by mouth 3 (three) times daily. 7am/1pm/7pm  . Ascorbic Acid (VITAMIN C) 100 MG tablet Take 100 mg by mouth daily.  . carbidopa-levodopa (SINEMET CR) 50-200 MG tablet Take 1 tablet by mouth at bedtime.  . carbidopa-levodopa (SINEMET IR) 25-100 MG tablet Take 1 tablet by mouth 4 (four) times daily. 7am/11am/3pm/7pm  . carbidopa-levodopa-entacapone (STALEVO) 50-200-200 MG tablet Take 1 tablet by mouth 4 (four) times daily. 7am/11am/3pm/7pm  . Cholecalciferol (VITAMIN D3) 2000 units capsule Take by mouth.  . clonazePAM (KLONOPIN) 0.5 MG tablet Take 0.5 mg by mouth at bedtime.   . gabapentin (NEURONTIN) 300 MG capsule Take 1 capsule by mouth 2 (two) times daily.   Marland Kitchen levothyroxine (SYNTHROID, LEVOTHROID) 150 MCG tablet Take 150 mcg by mouth daily before breakfast.  . lisinopril (PRINIVIL,ZESTRIL) 20 MG tablet Take 1 tablet by mouth daily.  . Magnesium Oxide 400 MG CAPS Take by mouth.  . Multiple Vitamin (MULTIVITAMIN) capsule Take by mouth.  . Omega-3 Fatty Acids (FISH OIL) 1000 MG CAPS Take by mouth.  . ranitidine (ZANTAC) 150 MG tablet Take 150 mg by mouth 2 (two) times daily.  . Testosterone 10 MG/ACT (2%) GEL Apply 6 pumps to skin daily, 3 pumps to each side for total of 6.  . TURMERIC PO Take by mouth daily.  . Vitamin Mixture (VITAMIN E COMPLETE PO) Take by mouth daily.  . [DISCONTINUED] B Complex-C (B-COMPLEX WITH VITAMIN C) tablet Take by mouth.  . [DISCONTINUED] co-enzyme Q-10 30 MG capsule Take 30 mg by mouth daily.  . [DISCONTINUED] levothyroxine (SYNTHROID, LEVOTHROID) 200 MCG tablet TAKE 1 TABLET DAILY EXCEPT SAT & SUN TAKE 1/2 TABLET  . [DISCONTINUED] omeprazole (PRILOSEC) 20 MG capsule  Take 20 mg by mouth daily.   No facility-administered encounter medications on file as of 01/16/2018.     PAST MEDICAL HISTORY:   Past Medical History:  Diagnosis Date  . GERD (gastroesophageal reflux disease)   . Hypertension   . Hypothyroidism   . Lumbar stenosis   . OSA (obstructive sleep apnea)   . Parkinson's disease ([place])   . REM behavioral disorder   . Urinary incontinence     PAST SURGICAL HISTORY:   Past Surgical History:  Procedure Laterality Date  . BACK SURGERY    . DEEP BRAIN STIMULATOR PLACEMENT     left 12/27/2012, right 12/10/2011, left revision 02/02/2013, replacement bilateral 11/21/2015  . KNEE SURGERY    . NEPHRECTOMY    . SPINAL FUSION    .  TONSILLECTOMY      SOCIAL HISTORY:   Social History   Socioeconomic History  . Marital status: Married    Spouse name: Not on file  . Number of children: Not on file  . Years of education: Not on file  . Highest education level: Not on file  Occupational History  . Not on file  Social Needs  . Financial resource strain: Not on file  . Food insecurity:    Worry: Not on file    Inability: Not on file  . Transportation needs:    Medical: Not on file    Non-medical: Not on file  Tobacco Use  . Smoking status: Never Smoker  . Smokeless tobacco: Never Used  Substance and Sexual Activity  . Alcohol use: Not Currently  . Drug use: Never  . Sexual activity: Not on file  Lifestyle  . Physical activity:    Days per week: Not on file    Minutes per session: Not on file  . Stress: Not on file  Relationships  . Social connections:    Talks on phone: Not on file    Gets together: Not on file    Attends religious service: Not on file    Active member of club or organization: Not on file    Attends meetings of clubs or organizations: Not on file    Relationship status: Not on file  . Intimate partner violence:    Fear of current or ex partner: Not on file    Emotionally abused: Not on file    Physically  abused: Not on file    Forced sexual activity: Not on file  Other Topics Concern  . Not on file  Social History Narrative  . Not on file    FAMILY HISTORY:   Family Status  Relation Name Status  . Mother  Alive  . Father  Deceased  . Brother  Alive  . Child x3 Alive    ROS:  Review of Systems  Constitutional: Positive for malaise/fatigue.  HENT: Negative.   Eyes: Positive for blurred vision.  Respiratory: Negative.   Cardiovascular: Negative.   Gastrointestinal: Negative.   Genitourinary: Negative.   Skin: Negative.   Neurological: Negative.   Endo/Heme/Allergies: Negative.     PHYSICAL EXAMINATION:    VITALS:   Vitals:   01/16/18 1337  BP: 128/88  Pulse: 96  SpO2: 94%  Weight: 222 lb (100.7 kg)  Height: 6\' 3"  (1.905 m)    GEN:  The patient appears stated age and is in NAD. HEENT:  Normocephalic, atraumatic.  The mucous membranes are moist. The superficial temporal arteries are without ropiness or tenderness. CV:  RRR Lungs:  CTAB Neck/HEME:  There are no carotid bruits bilaterally.  Neurological examination:  Orientation: The patient is alert and oriented x3. Cranial nerves: There is good facial symmetry. The speech is fluent and dysarthric. Soft palate rises symmetrically and there is no tongue deviation. Hearing is intact to conversational tone. Sensation: Sensation is intact to light touch throughout Motor: Strength is 5/5 in the bilateral upper and lower extremities.   Shoulder shrug is equal and symmetric.  There is no pronator drift.  Movement examination: Tone: There is normal tone in the upper and lower extremities. Abnormal movements: There is mild dyskinesia in the right arm and right leg. Coordination:  There is no decremation with RAM's, with any form of RAMS, including alternating supination and pronation of the forearm, hand opening and closing, finger taps, heel taps and  toe taps bilaterally Gait and Station: The patient arises today without  the use of his hands.  He uses his U step walker and walks well with that.   ASSESSMENT/PLAN:  1.  Idiopathic Parkinson's disease, diagnosed in the year 2000  -The patient underwent right STN DBS on December 10, 2011 and a left STN DBS on December 27, 2012.  Postoperatively, his left STN DBS lead was found to be displaced and had revision of the left lead on February 02, 2013.  It appears battery was last changed in September, 2017.  His battery is at end-of-life on the left brain.  I will send a referral to Dr. Vertell Limber.  -Postop images done in 2019 indicate that the left lead still appears to be inferior in location compared to that of the right lead, despite the fact that this is been revised on one occasion.  He asked about revising that, and ultimately we decided to hold on that for right now.  He is actually doing fairly well considering the amount of time that he has had Parkinson's disease.  -Patient is receiving Botox from Dr. Letta Pate for feet dystonia and that is helping.  -Patient asks about revising his medication.  I did not want to do that given that his battery is at end-of-life.  I may start that next visit.  However, it is going to be a challenge to lower his medicine some to help the right-sided dyskinesia given that the left side of the body may need the medication since the DBS is not adequately placed on that side.  -Talked about the value of exercising.  I would recommend [facility].  Patient literature was given.  2.  Patient will follow-up with me in the next several months.  In the meantime, he is going to be going on a trip to [place] for a week.  I told him to take his DBS programmer given that his battery is at the end of life.  I do not think it will shut off that soon, but if so we will have to supplement with more Stalevo/levodopa.  Much greater than 50% of this visit was spent in counseling and coordinating care.  Total face to face time:  35 min which didn't include DBS  time   Cc:  Jefm Petty, MD

## 2018-01-16 ENCOUNTER — Ambulatory Visit (INDEPENDENT_AMBULATORY_CARE_PROVIDER_SITE_OTHER): Payer: Medicare Other | Admitting: Neurology

## 2018-01-16 ENCOUNTER — Encounter: Payer: Self-pay | Admitting: Neurology

## 2018-01-16 ENCOUNTER — Telehealth: Payer: Self-pay | Admitting: Neurology

## 2018-01-16 VITALS — BP 128/88 | HR 96 | Ht 75.0 in | Wt 222.0 lb

## 2018-01-16 DIAGNOSIS — G249 Dystonia, unspecified: Secondary | ICD-10-CM | POA: Diagnosis not present

## 2018-01-16 DIAGNOSIS — G20A1 Parkinson's disease without dyskinesia, without mention of fluctuations: Secondary | ICD-10-CM | POA: Insufficient documentation

## 2018-01-16 DIAGNOSIS — G20 Parkinson's disease: Secondary | ICD-10-CM

## 2018-01-16 MED ORDER — CLONAZEPAM 0.5 MG PO TABS: 0.5000 mg | ORAL_TABLET | Freq: Every day | ORAL | 1 refills | Status: DC

## 2018-01-16 NOTE — Procedures (Signed)
DBS Programming was performed.    Total time spent programming was 10 minutes.  Device was confirmed to be on.  Soft start was confirmed to be on.  Impedences were checked and were within normal limits.  Battery was checked and was determined to be near the end of life on the left only and referral sent for replacement.  Battery on the right was normal at 2.87.  Final settings were as follows:  Left brain electrode:     2-1+           ; Amplitude  4.7   V   ; Pulse width 90 microseconds;   Frequency   135   Hz.  Right brain electrode:     3-C+          ; Amplitude   3.2  V ;  Pulse width 90  microseconds;  Frequency   135    Hz.

## 2018-01-16 NOTE — Telephone Encounter (Signed)
Referral faxed to [facility] at [phone] with confirmation received. They will contact the patient to schedule.  

## 2018-01-23 NOTE — Telephone Encounter (Signed)
Note received. Patient has appt with Dr. Vertell Limber on 01/30/2018 at 11:30 am.

## 2018-01-30 ENCOUNTER — Other Ambulatory Visit: Payer: Self-pay | Admitting: Neurosurgery

## 2018-01-30 ENCOUNTER — Encounter: Payer: Self-pay | Admitting: Physical Medicine & Rehabilitation

## 2018-01-30 ENCOUNTER — Ambulatory Visit (HOSPITAL_BASED_OUTPATIENT_CLINIC_OR_DEPARTMENT_OTHER): Payer: Medicare Other | Admitting: Physical Medicine & Rehabilitation

## 2018-01-30 ENCOUNTER — Encounter: Payer: Medicare Other | Attending: Physical Medicine & Rehabilitation

## 2018-01-30 VITALS — BP 153/100 | HR 100 | Resp 14 | Ht 75.0 in | Wt 222.0 lb

## 2018-01-30 DIAGNOSIS — E039 Hypothyroidism, unspecified: Secondary | ICD-10-CM | POA: Diagnosis not present

## 2018-01-30 DIAGNOSIS — G248 Other dystonia: Secondary | ICD-10-CM | POA: Diagnosis present

## 2018-01-30 DIAGNOSIS — Z79890 Hormone replacement therapy (postmenopausal): Secondary | ICD-10-CM | POA: Diagnosis not present

## 2018-01-30 DIAGNOSIS — Z8669 Personal history of other diseases of the nervous system and sense organs: Secondary | ICD-10-CM

## 2018-01-30 DIAGNOSIS — Z79899 Other long term (current) drug therapy: Secondary | ICD-10-CM | POA: Diagnosis not present

## 2018-01-30 DIAGNOSIS — I10 Essential (primary) hypertension: Secondary | ICD-10-CM | POA: Insufficient documentation

## 2018-01-30 DIAGNOSIS — M533 Sacrococcygeal disorders, not elsewhere classified: Secondary | ICD-10-CM | POA: Diagnosis not present

## 2018-01-30 DIAGNOSIS — G20 Parkinson's disease: Secondary | ICD-10-CM | POA: Insufficient documentation

## 2018-01-30 DIAGNOSIS — K219 Gastro-esophageal reflux disease without esophagitis: Secondary | ICD-10-CM | POA: Insufficient documentation

## 2018-01-30 DIAGNOSIS — G4733 Obstructive sleep apnea (adult) (pediatric): Secondary | ICD-10-CM | POA: Diagnosis not present

## 2018-01-30 DIAGNOSIS — M48061 Spinal stenosis, lumbar region without neurogenic claudication: Secondary | ICD-10-CM | POA: Diagnosis not present

## 2018-01-30 NOTE — Progress Notes (Signed)
Subjective:    Patient ID: Patrick Ross, male    DOB: 03/09/1945, 72 y.o.   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  THe pt underwent Botulinum tolxin injection oft he the RLE on 11/03/17    Right flexor hall longus 50 units Right flexor digitorum longus 50 units Right flexor hallucis brevis 50 units Right flexor digitorum brevis 50 units  The pt notes overall improved muscle relaxation with reduced toe flexion and ankle inversion during ambulation post injection.Over the last 2 wks , pt has noted some increase in tone in toe flexors and ankle invertors.  The pt was also complains of L>R Low back pain without shooting pains into the foot.  This is a chronic problem but inhibits his mobility and results in moderate to severe pain.   11/18/17 Bilateral sacroiliac injections under fluoroscopic guidance gave a 50% relief of pain for 1.5 months.  Pain Inventory Average Pain 8 Pain Right Now 6 My pain is sharp  In the last 24 hours, has pain interfered with the following? General activity 4 Relation with others 0 Enjoyment of life 3 What TIME of day is your pain at its worst? ? Sleep (in general) Good  Pain is worse with: sitting and inactivity Pain improves with: therapy/exercise and medication Relief from Meds: 2  Mobility walk with assistance use a walker do you drive?  no  Function retired  Neuro/Psych trouble walking dizziness  Prior Studies Any changes since last visit?  no  Physicians involved in your care Any changes since last visit?  no   Family History  Problem Relation Age of Onset  . AAA (abdominal aortic aneurysm) Mother        31 yo  . Heart disease Father   . Diabetes Father   . Heart disease Brother   . Diabetes Brother    Social History   Socioeconomic History  . Marital status: Married    Spouse name: Not on file  . Number of children: Not on  file  . Years of education: Not on file  . Highest education level: Not on file  Occupational History  . Not on file  Social Needs  . Financial resource strain: Not on file  . Food insecurity:    Worry: Not on file    Inability: Not on file  . Transportation needs:    Medical: Not on file    Non-medical: Not on file  Tobacco Use  . Smoking status: Never Smoker  . Smokeless tobacco: Never Used  Substance and Sexual Activity  . Alcohol use: Not Currently  . Drug use: Never  . Sexual activity: Not on file  Lifestyle  . Physical activity:    Days per week: Not on file    Minutes per session: Not on file  . Stress: Not on file  Relationships  . Social connections:    Talks on phone: Not on file    Gets together: Not on file    Attends religious service: Not on file    Active member of club or organization: Not on file    Attends meetings of clubs or organizations: Not on file    Relationship status: Not on file  Other Topics Concern  . Not on file  Social History Narrative  . Not on file   Past Surgical History:  Procedure Laterality Date  . BACK SURGERY    .  DEEP BRAIN STIMULATOR PLACEMENT     left 12/27/2012, right 12/10/2011, left revision 02/02/2013, replacement bilateral 11/21/2015  . KNEE SURGERY    . NEPHRECTOMY    . SPINAL FUSION    . TONSILLECTOMY     Past Medical History:  Diagnosis Date  . GERD (gastroesophageal reflux disease)   . Hypertension   . Hypothyroidism   . Lumbar stenosis   . OSA (obstructive sleep apnea)   . Parkinson's disease ([place])   . REM behavioral disorder   . Urinary incontinence    BP (!) 153/100   Pulse 100   Resp 14   Ht 6\' 3"  (1.905 m) Comment: previously  Wt 222 lb (100.7 kg) Comment: previously  SpO2 93%   BMI 27.75 kg/m   Opioid Risk Score:   Fall Risk Score:  `1  Depression screen PHQ 2/9  Depression screen [facility] 2/9 11/18/2017 09/20/2017  Decreased Interest 0 0  Down, Depressed, Hopeless 0 0  PHQ - 2 Score 0 0     Review of Systems  Constitutional: Negative.   HENT: Negative.   Eyes: Positive for visual disturbance.  Respiratory: Positive for apnea.   Cardiovascular: Negative.   Gastrointestinal: Negative.   Endocrine: Negative.   Genitourinary: Negative.   Musculoskeletal: Positive for gait problem.  Skin: Negative.   Allergic/Immunologic: Negative.   Neurological: Positive for dizziness and speech difficulty.  Hematological: Negative.   Psychiatric/Behavioral: Negative.        Objective:   Physical Exam  Constitutional: He is oriented to person, place, and time. He appears well-developed and well-nourished.  HENT:  Head: Normocephalic and atraumatic.  Eyes: Pupils are equal, round, and reactive to light.  Neurological: He is alert and oriented to person, place, and time.  Hypophonic, hyperkinetic dysarthria  Nursing note and vitals reviewed.  Tone- MAS 1 in toe flexors and ankle inverters With standing the right foot  Inverts and weight bearing is on lateral foot  Motor + trendelenberg sign on right with  4/5 Hip abd  4/5 Right ankle DF, 5/5 HF., KE  No tenderness over the lumbar spine, + tenderness L>R PSIS area      Assessment & Plan:  1.  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  Sacroiliac pain - Left >R would either repeat Right Sacroiliac intra articular injection or perform L5 DR, S1,2,3 Lateral branch block as an eval for possible RFA

## 2018-01-30 NOTE — Patient Instructions (Signed)
Botox next visit, will plan on sacroiliac block after that

## 2018-01-31 NOTE — H&P (Signed)
Patient ID:   [id number] Patient: Patrick Ross  Date of Birth: November 30, 1945 Visit Type: Office Visit   Date: 01/30/2018 11:30 AM Provider: Marchia Meiers. Vertell Limber MD   This 72 year old male presents for Brain/ DBS.  HISTORY OF PRESENT ILLNESS:  1.  Brain/ DBS  Patrick Ross, 72 year old retired male, visits on referral from Dr. Carles Collet to discuss revision of left chest implanted pulse [employer] generator/battery.  Patient reports that his right-sided DBS provides more improvement of his symptoms than his left-sided DBS.  Not currently on any blood thinners  12/10/2011 right STN DBS 12/27/2012 left STN DBS with revision 02/02/2013 September 2017 both batteries changed  History:  Parkinson's, hypothyroidism, sleep apnea (CPAP) Surgical history:  Tonsillectomy 1974, partial right nephrectomy 2010, lumbar surgeries 1991, 2010, 2013, 2014, 2016, 2018; deep brain stimulator surgeries as listed above         Medical/Surgical/Interim History Reviewed, no change.     Family History:  Reviewed, no changes.    Social History: Reviewed, no changes.   MEDICATIONS: (added, continued or stopped this visit) Started Medication Directions Instruction Stopped   amantadine HCl 100 mg tablet take 1 tablet by oral route 2 times every day     carbidopa 25 mg-levodopa 100 mg tablet take 1 tablet by oral route 3 times every day     carbidopa ER 25 mg-levodopa 100 mg tablet,extended release take 1 tablet by oral route 2 times every day     Fortesta 10 mg/0.5 gram/actuation transdermal gel pump apply (40MG )  by topical route  every day in the morning to the front and inside area of the thighs     gabapentin 300 mg capsule take 1 capsule by oral route 3 times every day     Ibuprofen IB 200 mg tablet take 4 tablet by oral route  every 8 hours as needed with food     Synthroid 25 mcg tablet take 1 tablet by oral route  every day     Tylenol 325 mg tablet take 1 tablet by oral route  every 4 hours as needed        ALLERGIES: Ingredient Reaction Medication Name Comment  SULFA (SULFONAMIDE ANTIBIOTICS)     ADHESIVE      Reviewed, updated.   REVIEW OF SYSTEMS   See scanned patient registration form, dated, signed and dated on   Review of Systems Details System Neg/Pos Details  Constitutional Negative Chills, Fatigue, Fever, Malaise, Night sweats, Weight gain and Weight loss.  ENMT Negative Ear drainage, Hearing loss, Nasal drainage, Otalgia, Sinus pressure and Sore throat.  Eyes Negative Eye discharge, Eye pain and Vision changes.  Respiratory Negative Chronic cough, Cough, Dyspnea, Known TB exposure and Wheezing.  Cardio Negative Chest pain, Claudication, Edema and Irregular heartbeat/palpitations.  GI Negative Abdominal pain, Blood in stool, Change in stool pattern, Constipation, Decreased appetite, Diarrhea, Heartburn, Nausea and Vomiting.  GU Negative Dribbling, Dysuria, Erectile dysfunction, Hematuria, Polyuria (Genitourinary), Slow stream, Urinary frequency, Urinary incontinence and Urinary retention.  Endocrine Negative Cold intolerance, Heat intolerance, Polydipsia and Polyphagia.  Neuro Negative Dizziness, Extremity weakness, Gait disturbance, Headache, Memory impairment, Numbness in extremity, Seizures and Tremors.  Psych Negative Anxiety, Depression and Insomnia.  Integumentary Negative Brittle hair, Brittle nails, Change in shape/size of mole(s), Hair loss, Hirsutism, Hives, Pruritus, Rash and Skin lesion.  MS Negative Back pain, Joint pain, Joint swelling, Muscle weakness and Neck pain.  Hema/Lymph Negative Easy bleeding, Easy bruising and Lymphadenopathy.  Allergic/Immuno Negative Contact allergy, Environmental allergies, Food allergies  and Seasonal allergies.  Reproductive Negative Penile discharge and Sexual dysfunction.   PHYSICAL EXAM:   Vitals Date Temp F BP Pulse Ht In Wt Lb BMI BSA Pain Score  01/30/2018  172/105 106 74 225 28.89  0/10    PHYSICAL EXAM  Details General Level of Distress: no acute distress Overall Appearance: normal  Head and Face  Right Left  Fundoscopic Exam:  normal normal    Cardiovascular Cardiac: regular rate and rhythm without murmur  Right Left  Carotid Pulses: normal normal  Respiratory Lungs: clear to auscultation  Neurological Orientation: normal Recent and Remote Memory: normal Attention Span and Concentration:   normal Language: normal Fund of Knowledge: normal  Right Left Sensation: normal normal Upper Extremity Coordination: normal normal  Lower Extremity Coordination: normal normal  Musculoskeletal Gait and Station: normal  Right Left Upper Extremity Muscle Strength: normal normal Lower Extremity Muscle Strength: normal normal Upper Extremity Muscle Tone:  normal normal Lower Extremity Muscle Tone: normal normal   Motor Strength Upper and lower extremity motor strength was tested in the clinically pertinent muscles.     Deep Tendon Reflexes  Right Left Biceps: normal normal Triceps: normal normal Brachioradialis: normal normal Patellar: normal normal Achilles: normal normal  Cranial Nerves II. Optic Nerve/Visual Fields: normal III. Oculomotor: normal IV. Trochlear: normal V. Trigeminal: normal VI. Abducens: normal VII. Facial: normal VIII. Acoustic/Vestibular: normal IX. Glossopharyngeal: normal X. Vagus: normal XI. Spinal Accessory: normal XII. Hypoglossal: normal  Motor and other Tests Lhermittes: negative Rhomberg: negative Pronator drift: absent     Right Left Hoffman's: normal normal Clonus: normal normal Babinski: normal normal   Additional Findings:  Upon examination, patient utilizes a rollator to aid in walking, right greater than left rigidity, right greater than left bradykinesia.    IMPRESSION:   Upon examination, patient utilizes a rollator to aid in walking, right greater than left rigidity, right greater than left  bradykinesia.  Change battery in left chest IPG scheduled for 02/02/18. Patient prefers sedation instead of general anesthesia. Recommended patient postpone routine colonoscopy 1 month due to his battery change.  PLAN:  1. Left chest IPG battery change scheduled for 02/02/18 2. Follow-up after battery change  Orders: Instruction(s)/Education: Assessment Instruction  R03.0 Hypertension education  Z68.28 Lifestyle education regarding diet   Completed Orders (this encounter) Order Details Reason Side Interpretation Result Initial Treatment Date Region  Hypertension education Continue to monitor blood pressure, if blood pressure remains elevated contact primary doctor        Lifestyle education regarding diet Patient encourage to eat a well balance diet         Assessment/Plan   # Detail Type Description   1. Assessment Parkinsons (G20).       2. Assessment End of battery life of deep brain stimulator (Z45.42).       3. Assessment Body mass index (BMI) 28.0-28.9, adult (E08.14).   Plan Orders Today's instructions / counseling include(s) Lifestyle education regarding diet. Clinical information/comments: Patient encourage to eat a well balance diet.       4. Assessment Elevated blood-pressure reading, w/o diagnosis of htn (R03.0).         Pain Management Plan Pain Scale: 0/10. Method: Numeric Pain Intensity Scale. Location: brain. Onset: 11/01/2014. Duration: varies. Quality: discomforting. Pain management follow-up plan of care: Patient is taking OTC pain relieves for relief.  Fall Risk Plan The patient has not fallen in the last year.  Provider:  Marchia Meiers. Vertell Limber MD  01/30/2018 05:26 PM Dictation edited by: Mirian Mo    CC Providers: Alonza Bogus  [address] [place], [place] [address]               Electronically signed by Marchia Meiers. Vertell Limber MD on 01/31/2018 07:35 AM

## 2018-02-01 ENCOUNTER — Other Ambulatory Visit: Payer: Self-pay

## 2018-02-01 ENCOUNTER — Encounter (HOSPITAL_COMMUNITY): Payer: Self-pay | Admitting: *Deleted

## 2018-02-01 NOTE — Progress Notes (Signed)
SDW-Pre-op call completed by pt spouse, Jackelyn Poling (DPR). Spouse denies that pt C/O SOB and chest pain. Spouse denies that pt is under the care of a cardiologist. Spouse denies that pt had a cardiac cath and echo but stated that a stress test was performed > 30 years ago. Spouse made aware to have pt stop taking Aspirin (does not take), vitamins, fish oil, Turmeric and herbal medications. Do not take any NSAIDs ie: Ibuprofen, Advil, Naproxen (Aleve), Motrin, BC and Goody Powder. Spouse verbalized understanding of all pre-op instructions.

## 2018-02-02 ENCOUNTER — Ambulatory Visit (HOSPITAL_COMMUNITY): Payer: Medicare Other | Admitting: Certified Registered Nurse Anesthetist

## 2018-02-02 ENCOUNTER — Ambulatory Visit (HOSPITAL_COMMUNITY)
Admission: RE | Admit: 2018-02-02 | Discharge: 2018-02-02 | Disposition: A | Discharge status: Home / Self Care | Payer: Medicare Other | Source: Ambulatory Visit | Attending: Neurosurgery | Admitting: Neurosurgery

## 2018-02-02 ENCOUNTER — Encounter (HOSPITAL_COMMUNITY)
Admission: RE | Disposition: A | Discharge status: Home / Self Care | Payer: Self-pay | Source: Ambulatory Visit | Attending: Neurosurgery

## 2018-02-02 ENCOUNTER — Encounter (HOSPITAL_COMMUNITY): Payer: Self-pay

## 2018-02-02 DIAGNOSIS — I10 Essential (primary) hypertension: Secondary | ICD-10-CM | POA: Insufficient documentation

## 2018-02-02 DIAGNOSIS — Z79899 Other long term (current) drug therapy: Secondary | ICD-10-CM | POA: Diagnosis not present

## 2018-02-02 DIAGNOSIS — G4730 Sleep apnea, unspecified: Secondary | ICD-10-CM | POA: Diagnosis not present

## 2018-02-02 DIAGNOSIS — Y838 Other surgical procedures as the cause of abnormal reaction of the patient, or of later complication, without mention of misadventure at the time of the procedure: Secondary | ICD-10-CM | POA: Diagnosis not present

## 2018-02-02 DIAGNOSIS — G20 Parkinson's disease: Secondary | ICD-10-CM | POA: Diagnosis not present

## 2018-02-02 DIAGNOSIS — T85193A Other mechanical complication of implanted electronic neurostimulator, generator, initial encounter: Secondary | ICD-10-CM | POA: Insufficient documentation

## 2018-02-02 DIAGNOSIS — E039 Hypothyroidism, unspecified: Secondary | ICD-10-CM | POA: Insufficient documentation

## 2018-02-02 HISTORY — DX: Other specified postprocedural states: Z98.890

## 2018-02-02 HISTORY — DX: Personal history of urinary calculi: Z87.442

## 2018-02-02 HISTORY — PX: SUBTHALAMIC STIMULATOR BATTERY REPLACEMENT: SHX5405

## 2018-02-02 HISTORY — DX: Other specified postprocedural states: R11.2

## 2018-02-02 LAB — BASIC METABOLIC PANEL
Anion gap: 8 (ref 5–15)
BUN: 11 mg/dL (ref 8–23)
CO2: 27 mmol/L (ref 22–32)
Calcium: 9 mg/dL (ref 8.9–10.3)
Chloride: 106 mmol/L (ref 98–111)
Creatinine, Ser: 0.89 mg/dL (ref 0.61–1.24)
GFR calc Af Amer: >60 mL/min (ref >60)
GFR calc non Af Amer: >60 mL/min (ref >60)
Glucose, Bld: 95 mg/dL (ref 70–99)
Potassium: 4.4 mmol/L (ref 3.5–5.1)
Sodium: 141 mmol/L (ref 135–145)

## 2018-02-02 LAB — CBC
HCT: 48.6 % (ref 39.0–52.0)
Hemoglobin: 15.3 g/dL (ref 13.0–17.0)
MCH: 29.8 pg (ref 26.0–34.0)
MCHC: 31.5 g/dL (ref 30.0–36.0)
MCV: 94.6 fL (ref 80.0–100.0)
Platelets: 181 10*3/uL (ref 150–400)
RBC: 5.14 MIL/uL (ref 4.22–5.81)
RDW: 12.3 % (ref 11.5–15.5)
WBC: 5.7 10*3/uL (ref 4.0–10.5)
nRBC: 0 % (ref 0.0–0.2)

## 2018-02-02 LAB — SURGICAL PCR SCREEN
MRSA, PCR: NEGATIVE
Staphylococcus aureus: NEGATIVE

## 2018-02-02 SURGERY — SUBTHALAMIC STIMULATOR BATTERY REPLACEMENT
Anesthesia: General | Site: Chest | Laterality: Left

## 2018-02-02 MED ORDER — VANCOMYCIN HCL 1000 MG IV SOLR
INTRAVENOUS | Status: AC
  Filled 2018-02-02: qty 1000

## 2018-02-02 MED ORDER — CHLORHEXIDINE GLUCONATE CLOTH 2 % EX PADS: 6.0000 | MEDICATED_PAD | Freq: Once | CUTANEOUS | Status: DC

## 2018-02-02 MED ORDER — SODIUM CHLORIDE (PF) 0.9 % IJ SOLN
INTRAMUSCULAR | Status: AC
  Filled 2018-02-02: qty 10

## 2018-02-02 MED ORDER — METOPROLOL TARTRATE 5 MG/5ML IV SOLN
INTRAVENOUS | Status: AC
  Administered 2018-02-02: 2 mg via INTRAVENOUS
  Filled 2018-02-02: qty 5

## 2018-02-02 MED ORDER — GLYCOPYRROLATE PF 0.2 MG/ML IJ SOSY
PREFILLED_SYRINGE | INTRAMUSCULAR | Status: AC
  Filled 2018-02-02: qty 1

## 2018-02-02 MED ORDER — 0.9 % SODIUM CHLORIDE (POUR BTL) OPTIME
TOPICAL | Status: DC | PRN
  Administered 2018-02-02: 1000 mL

## 2018-02-02 MED ORDER — PHENYLEPHRINE 40 MCG/ML (10ML) SYRINGE FOR IV PUSH (FOR BLOOD PRESSURE SUPPORT)
PREFILLED_SYRINGE | INTRAVENOUS | Status: AC
  Filled 2018-02-02: qty 10

## 2018-02-02 MED ORDER — PROPOFOL 10 MG/ML IV BOLUS
INTRAVENOUS | Status: AC
  Filled 2018-02-02: qty 20

## 2018-02-02 MED ORDER — DEXAMETHASONE SODIUM PHOSPHATE 10 MG/ML IJ SOLN
INTRAMUSCULAR | Status: DC | PRN
  Administered 2018-02-02: 10 mg via INTRAVENOUS

## 2018-02-02 MED ORDER — CEFAZOLIN SODIUM-DEXTROSE 2-4 GM/100ML-% IV SOLN
INTRAVENOUS | Status: AC
  Filled 2018-02-02: qty 100

## 2018-02-02 MED ORDER — ROCURONIUM BROMIDE 50 MG/5ML IV SOSY
PREFILLED_SYRINGE | INTRAVENOUS | Status: AC
  Filled 2018-02-02: qty 5

## 2018-02-02 MED ORDER — LIDOCAINE-EPINEPHRINE 1 %-1:100000 IJ SOLN
INTRAMUSCULAR | Status: AC
  Filled 2018-02-02: qty 1

## 2018-02-02 MED ORDER — METOPROLOL TARTRATE 5 MG/5ML IV SOLN
2.0000 mg | Freq: Once | INTRAVENOUS | Status: AC
  Administered 2018-02-02: 2 mg via INTRAVENOUS

## 2018-02-02 MED ORDER — LIDOCAINE 2% (20 MG/ML) 5 ML SYRINGE
INTRAMUSCULAR | Status: AC
  Filled 2018-02-02: qty 5

## 2018-02-02 MED ORDER — MIDAZOLAM HCL 2 MG/2ML IJ SOLN
INTRAMUSCULAR | Status: AC
  Filled 2018-02-02: qty 2

## 2018-02-02 MED ORDER — LIDOCAINE-EPINEPHRINE 1 %-1:100000 IJ SOLN
INTRAMUSCULAR | Status: DC | PRN
  Administered 2018-02-02: 5 mL

## 2018-02-02 MED ORDER — PROPOFOL 10 MG/ML IV BOLUS
INTRAVENOUS | Status: DC | PRN
  Administered 2018-02-02: 170 mg via INTRAVENOUS

## 2018-02-02 MED ORDER — BUPIVACAINE HCL (PF) 0.5 % IJ SOLN
INTRAMUSCULAR | Status: AC
  Filled 2018-02-02: qty 30

## 2018-02-02 MED ORDER — METOPROLOL TARTRATE 5 MG/5ML IV SOLN: 2.5000 mg | Freq: Once | INTRAVENOUS | Status: DC

## 2018-02-02 MED ORDER — PHENYLEPHRINE HCL 10 MG/ML IJ SOLN
INTRAMUSCULAR | Status: DC | PRN
  Administered 2018-02-02: 80 ug via INTRAVENOUS
  Administered 2018-02-02: 120 ug via INTRAVENOUS

## 2018-02-02 MED ORDER — BUPIVACAINE HCL (PF) 0.5 % IJ SOLN
INTRAMUSCULAR | Status: DC | PRN
  Administered 2018-02-02: 5 mL

## 2018-02-02 MED ORDER — CEFAZOLIN SODIUM-DEXTROSE 2-4 GM/100ML-% IV SOLN
2.0000 g | INTRAVENOUS | Status: AC
  Administered 2018-02-02: 2 g via INTRAVENOUS

## 2018-02-02 MED ORDER — BACITRACIN ZINC 500 UNIT/GM EX OINT
TOPICAL_OINTMENT | CUTANEOUS | Status: AC
  Filled 2018-02-02: qty 28.35

## 2018-02-02 MED ORDER — ONDANSETRON HCL 4 MG/2ML IJ SOLN
INTRAMUSCULAR | Status: AC
  Filled 2018-02-02: qty 2

## 2018-02-02 MED ORDER — FENTANYL CITRATE (PF) 250 MCG/5ML IJ SOLN
INTRAMUSCULAR | Status: AC
  Filled 2018-02-02: qty 5

## 2018-02-02 MED ORDER — NEOSTIGMINE METHYLSULFATE 3 MG/3ML IV SOSY
PREFILLED_SYRINGE | INTRAVENOUS | Status: AC
  Filled 2018-02-02: qty 3

## 2018-02-02 MED ORDER — FENTANYL CITRATE (PF) 100 MCG/2ML IJ SOLN
INTRAMUSCULAR | Status: DC | PRN
  Administered 2018-02-02: 50 ug via INTRAVENOUS
  Administered 2018-02-02 (×2): 25 ug via INTRAVENOUS

## 2018-02-02 MED ORDER — ARTIFICIAL TEARS OPHTHALMIC OINT
TOPICAL_OINTMENT | OPHTHALMIC | Status: AC
  Filled 2018-02-02: qty 10.5

## 2018-02-02 MED ORDER — ONDANSETRON HCL 4 MG/2ML IJ SOLN
INTRAMUSCULAR | Status: DC | PRN
  Administered 2018-02-02: 4 mg via INTRAVENOUS

## 2018-02-02 MED ORDER — VANCOMYCIN HCL 1000 MG IV SOLR
INTRAVENOUS | Status: DC | PRN
  Administered 2018-02-02: 1000 mg

## 2018-02-02 MED ORDER — ARTIFICIAL TEARS OPHTHALMIC OINT
TOPICAL_OINTMENT | OPHTHALMIC | Status: AC
  Filled 2018-02-02: qty 3.5

## 2018-02-02 MED ORDER — LACTATED RINGERS IV SOLN
INTRAVENOUS | Status: DC
  Administered 2018-02-02: 12:00:00 via INTRAVENOUS

## 2018-02-02 MED ORDER — LIDOCAINE 2% (20 MG/ML) 5 ML SYRINGE
INTRAMUSCULAR | Status: DC | PRN
  Administered 2018-02-02: 80 mg via INTRAVENOUS

## 2018-02-02 MED ORDER — SUCCINYLCHOLINE CHLORIDE 200 MG/10ML IV SOSY
PREFILLED_SYRINGE | INTRAVENOUS | Status: AC
  Filled 2018-02-02: qty 10

## 2018-02-02 MED ORDER — EPHEDRINE 5 MG/ML INJ
INTRAVENOUS | Status: AC
  Filled 2018-02-02: qty 10

## 2018-02-02 MED ORDER — MIDAZOLAM HCL 2 MG/2ML IJ SOLN
INTRAMUSCULAR | Status: DC | PRN
  Administered 2018-02-02: 2 mg via INTRAVENOUS

## 2018-02-02 SURGICAL SUPPLY — 42 items
CANISTER SUCT 3000ML PPV (MISCELLANEOUS) ×3 IMPLANT
CARTRIDGE OIL MAESTRO DRILL (MISCELLANEOUS) ×1 IMPLANT
COVER WAND RF STERILE (DRAPES) ×3 IMPLANT
DECANTER SPIKE VIAL GLASS SM (MISCELLANEOUS) ×3 IMPLANT
DERMABOND ADVANCED (GAUZE/BANDAGES/DRESSINGS) ×2
DERMABOND ADVANCED .7 DNX12 (GAUZE/BANDAGES/DRESSINGS) ×1 IMPLANT
DIFFUSER DRILL AIR PNEUMATIC (MISCELLANEOUS) IMPLANT
DRAPE CAMERA VIDEO/LASER (DRAPES) ×3 IMPLANT
DRAPE LAPAROTOMY 100X72 PEDS (DRAPES) ×3 IMPLANT
DRAPE POUCH INSTRU U-SHP 10X18 (DRAPES) ×3 IMPLANT
DRSG OPSITE POSTOP 3X4 (GAUZE/BANDAGES/DRESSINGS) ×3 IMPLANT
DURAPREP 26ML APPLICATOR (WOUND CARE) ×3 IMPLANT
GAUZE 4X4 16PLY RFD (DISPOSABLE) IMPLANT
GLOVE BIO SURGEON STRL SZ8 (GLOVE) ×3 IMPLANT
GLOVE BIOGEL PI IND STRL 7.0 (GLOVE) ×3 IMPLANT
GLOVE BIOGEL PI IND STRL 7.5 (GLOVE) ×3 IMPLANT
GLOVE BIOGEL PI IND STRL 8 (GLOVE) ×1 IMPLANT
GLOVE BIOGEL PI IND STRL 8.5 (GLOVE) ×1 IMPLANT
GLOVE BIOGEL PI INDICATOR 7.0 (GLOVE) ×6
GLOVE BIOGEL PI INDICATOR 7.5 (GLOVE) ×6
GLOVE BIOGEL PI INDICATOR 8 (GLOVE) ×2
GLOVE BIOGEL PI INDICATOR 8.5 (GLOVE) ×2
GLOVE ECLIPSE 8.0 STRL XLNG CF (GLOVE) ×3 IMPLANT
GLOVE EXAM NITRILE XL STR (GLOVE) IMPLANT
GOWN STRL REUS W/ TWL LRG LVL3 (GOWN DISPOSABLE) IMPLANT
GOWN STRL REUS W/ TWL XL LVL3 (GOWN DISPOSABLE) ×2 IMPLANT
GOWN STRL REUS W/TWL 2XL LVL3 (GOWN DISPOSABLE) ×3 IMPLANT
GOWN STRL REUS W/TWL LRG LVL3 (GOWN DISPOSABLE)
GOWN STRL REUS W/TWL XL LVL3 (GOWN DISPOSABLE) ×4
KIT BASIN OR (CUSTOM PROCEDURE TRAY) ×3 IMPLANT
KIT TURNOVER KIT B (KITS) ×3 IMPLANT
NEEDLE HYPO 25X1 1.5 SAFETY (NEEDLE) ×3 IMPLANT
NEUROSTIM OCTOPOLAR ~~LOC~~ 60X55 (Neuro Prosthesis/Implant) ×3 IMPLANT
NS IRRIG 1000ML POUR BTL (IV SOLUTION) ×3 IMPLANT
OIL CARTRIDGE MAESTRO DRILL (MISCELLANEOUS) ×3
PACK LAMINECTOMY NEURO (CUSTOM PROCEDURE TRAY) ×3 IMPLANT
PAD ARMBOARD 7.5X6 YLW CONV (MISCELLANEOUS) ×9 IMPLANT
SUT VIC AB 2-0 CP2 18 (SUTURE) ×3 IMPLANT
SUT VIC AB 3-0 SH 8-18 (SUTURE) ×6 IMPLANT
TOWEL GREEN STERILE (TOWEL DISPOSABLE) ×3 IMPLANT
TOWEL GREEN STERILE FF (TOWEL DISPOSABLE) ×3 IMPLANT
WATER STERILE IRR 1000ML POUR (IV SOLUTION) ×3 IMPLANT

## 2018-02-02 NOTE — Interval H&P Note (Signed)
History and Physical Interval Note:  02/02/2018 2:09 PM  Patrick Ross  has presented today for surgery, with the diagnosis of end of battery life of a deep brain stimulator  The various methods of treatment have been discussed with the patient and family. After consideration of risks, benefits and other options for treatment, the patient has consented to  Procedure(s): Change implantable pulse generator battery, Left chest (Left) as a surgical intervention .  The patient's history has been reviewed, patient examined, no change in status, stable for surgery.  I have reviewed the patient's chart and labs.  Questions were answered to the patient's satisfaction.     Peggyann Shoals

## 2018-02-02 NOTE — Anesthesia Preprocedure Evaluation (Addendum)
Anesthesia Evaluation  Patient identified by MRN, date of birth, ID band Patient awake    Reviewed: Allergy & Precautions, NPO status , Patient's Chart, lab work & pertinent test results  History of Anesthesia Complications (+) PONV  Airway Mallampati: II  TM Distance: >3 FB Neck ROM: Full    Dental no notable dental hx.    Pulmonary sleep apnea ,    Pulmonary exam normal breath sounds clear to auscultation       Cardiovascular hypertension, Pt. on medications Normal cardiovascular exam Rhythm:Regular Rate:Normal  Poor control   Neuro/Psych parkinsons dz  Neuromuscular disease negative psych ROS   GI/Hepatic negative GI ROS, Neg liver ROS,   Endo/Other  Hypothyroidism   Renal/GU negative Renal ROS  negative genitourinary   Musculoskeletal negative musculoskeletal ROS (+)   Abdominal   Peds negative pediatric ROS (+)  Hematology negative hematology ROS (+)   Anesthesia Other Findings   Reproductive/Obstetrics negative OB ROS                            Anesthesia Physical Anesthesia Plan  ASA: III  Anesthesia Plan: General   Post-op Pain Management:    Induction: Intravenous  PONV Risk Score and Plan: 3 and Ondansetron, Dexamethasone and Treatment may vary due to age or medical condition  Airway Management Planned: Oral ETT and LMA  Additional Equipment:   Intra-op Plan:   Post-operative Plan: Extubation in OR  Informed Consent: I have reviewed the patients History and Physical, chart, labs and discussed the procedure including the risks, benefits and alternatives for the proposed anesthesia with the patient or authorized representative who has indicated his/her understanding and acceptance.   Dental advisory given  Plan Discussed with: CRNA and Surgeon  Anesthesia Plan Comments:         Anesthesia Quick Evaluation

## 2018-02-02 NOTE — Transfer of Care (Signed)
Immediate Anesthesia Transfer of Care Note  Patient: Patrick Ross  Procedure(s) Performed: Change implantable pulse generator battery, Left chest (Left Chest)  Patient Location: PACU  Anesthesia Type:General  Level of Consciousness: sedated  Airway & Oxygen Therapy: Patient Spontanous Breathing and Patient connected to nasal cannula oxygen  Post-op Assessment: Report given to RN and Post -op Vital signs reviewed and stable  Post vital signs: Reviewed and stable  Last Vitals:  Vitals Value Taken Time  BP 141/87 02/02/2018  3:10 PM  Temp    Pulse 85 02/02/2018  3:11 PM  Resp 19 02/02/2018  3:11 PM  SpO2 99 % 02/02/2018  3:11 PM  Vitals shown include unvalidated device data.  Last Pain:  Vitals:   02/02/18 1136  TempSrc:   PainSc: 0-No pain      Patients Stated Pain Goal: 3 (70/01/74 9449)  Complications: No apparent anesthesia complications

## 2018-02-02 NOTE — Brief Op Note (Signed)
02/02/2018  2:53 PM  PATIENT:  Patrick Ross  72 y.o. male  PRE-OPERATIVE DIAGNOSIS:  end of battery life of a deep brain stimulator left chest  POST-OPERATIVE DIAGNOSIS:  end of battery life of a deep brain stimulator left chest  PROCEDURE:  Procedure(s): Change implantable pulse generator battery, Left chest (Left)  SURGEON:  Surgeon(s) and Role:    Erline Levine, MD - Primary  PHYSICIAN ASSISTANT:   ASSISTANTS: Poteat, RN   ANESTHESIA:   general  EBL:  10 mL   BLOOD ADMINISTERED:none  DRAINS: none   LOCAL MEDICATIONS USED:  MARCAINE    and LIDOCAINE   SPECIMEN:  No Specimen  DISPOSITION OF SPECIMEN:  N/A  COUNTS:  YES  TOURNIQUET:  * No tourniquets in log *  DICTATION: Patient has implanted subthalamic stimulator electrodes and IPG, which is now depleted.  It was elected for patient to undergo IPG revision.  PROCEDURE: Patient was brought to the operating room and given intravenous sedation.  Left upper chest was prepped with betadine scrub and Duraprep.  Area of planned incision was infiltrated with lidocaine.  Prior incision was reopened and the old IPG was externalized.  New IPG which was placed in the pocket.  Wound was irrigated with bacitracin and sprinkled with vancomycin. Then irrigated once more.  Incision was closed with 2-0 Vicryl and 3-0 vicryl sutures and dressed with a sterile occlusive dressing.  Counts were correct at the end of the case.  Impedances were correct.  PLAN OF CARE: Discharge to home after PACU  PATIENT DISPOSITION:  PACU - hemodynamically stable.   Delay start of Pharmacological VTE agent (>24hrs) due to surgical blood loss or risk of bleeding: yes

## 2018-02-02 NOTE — Op Note (Signed)
02/02/2018  2:53 PM  PATIENT:  Patrick Ross  72 y.o. male  PRE-OPERATIVE DIAGNOSIS:  end of battery life of a deep brain stimulator left chest  POST-OPERATIVE DIAGNOSIS:  end of battery life of a deep brain stimulator left chest  PROCEDURE:  Procedure(s): Change implantable pulse generator battery, Left chest (Left)  SURGEON:  Surgeon(s) and Role:    Erline Levine, MD - Primary  PHYSICIAN ASSISTANT:   ASSISTANTS: Poteat, RN   ANESTHESIA:   general  EBL:  10 mL   BLOOD ADMINISTERED:none  DRAINS: none   LOCAL MEDICATIONS USED:  MARCAINE    and LIDOCAINE   SPECIMEN:  No Specimen  DISPOSITION OF SPECIMEN:  N/A  COUNTS:  YES  TOURNIQUET:  * No tourniquets in log *  DICTATION: Patient has implanted subthalamic stimulator electrodes and IPG, which is now depleted.  It was elected for patient to undergo IPG revision.  PROCEDURE: Patient was brought to the operating room and given intravenous sedation.  Left upper chest was prepped with betadine scrub and Duraprep.  Area of planned incision was infiltrated with lidocaine.  Prior incision was reopened and the old IPG was externalized.  New IPG which was placed in the pocket.  Wound was irrigated with bacitracin and sprinkled with vancomycin. Then irrigated once more.  Incision was closed with 2-0 Vicryl and 3-0 vicryl sutures and dressed with a sterile occlusive dressing.  Counts were correct at the end of the case.  Impedances were correct.  PLAN OF CARE: Discharge to home after PACU  PATIENT DISPOSITION:  PACU - hemodynamically stable.   Delay start of Pharmacological VTE agent (>24hrs) due to surgical blood loss or risk of bleeding: yes

## 2018-02-02 NOTE — Anesthesia Postprocedure Evaluation (Signed)
Anesthesia Post Note  Patient: Patrick Ross  Procedure(s) Performed: Change implantable pulse generator battery, Left chest (Left Chest)     Patient location during evaluation: PACU Anesthesia Type: General Level of consciousness: awake and alert and oriented Pain management: pain level controlled Vital Signs Assessment: post-procedure vital signs reviewed and stable Respiratory status: spontaneous breathing, nonlabored ventilation and respiratory function stable Cardiovascular status: blood pressure returned to baseline and stable Postop Assessment: no apparent nausea or vomiting Anesthetic complications: no    Last Vitals:  Vitals:   02/02/18 1530 02/02/18 1538  BP:  138/87  Pulse: 87 88  Resp: 20 17  Temp:    SpO2: 93% 97%    Last Pain:  Vitals:   02/02/18 1538  TempSrc:   PainSc: 0-No pain                 Marquice Uddin A.

## 2018-02-02 NOTE — Anesthesia Procedure Notes (Signed)
Procedure Name: LMA Insertion Date/Time: 02/02/2018 2:13 PM Performed by: Leonor Liv, CRNA Pre-anesthesia Checklist: Patient identified, Emergency Drugs available, Suction available and Patient being monitored Patient Re-evaluated:Patient Re-evaluated prior to induction Oxygen Delivery Method: Circle System Utilized Preoxygenation: Pre-oxygenation with 100% oxygen Induction Type: IV induction Ventilation: Mask ventilation without difficulty LMA: LMA inserted LMA Size: 5.0 Number of attempts: 1 Placement Confirmation: positive ETCO2 Tube secured with: Tape Dental Injury: Teeth and Oropharynx as per pre-operative assessment

## 2018-02-03 ENCOUNTER — Encounter (HOSPITAL_COMMUNITY): Payer: Self-pay | Admitting: Neurosurgery

## 2018-02-20 ENCOUNTER — Encounter: Payer: Self-pay | Admitting: Physical Medicine & Rehabilitation

## 2018-02-20 ENCOUNTER — Ambulatory Visit (HOSPITAL_BASED_OUTPATIENT_CLINIC_OR_DEPARTMENT_OTHER): Payer: Medicare Other | Admitting: Physical Medicine & Rehabilitation

## 2018-02-20 VITALS — BP 162/101 | HR 90 | Ht 75.0 in | Wt 222.0 lb

## 2018-02-20 DIAGNOSIS — E039 Hypothyroidism, unspecified: Secondary | ICD-10-CM | POA: Diagnosis not present

## 2018-02-20 DIAGNOSIS — Z79890 Hormone replacement therapy (postmenopausal): Secondary | ICD-10-CM | POA: Diagnosis not present

## 2018-02-20 DIAGNOSIS — I10 Essential (primary) hypertension: Secondary | ICD-10-CM | POA: Diagnosis not present

## 2018-02-20 DIAGNOSIS — G4733 Obstructive sleep apnea (adult) (pediatric): Secondary | ICD-10-CM | POA: Diagnosis not present

## 2018-02-20 DIAGNOSIS — K219 Gastro-esophageal reflux disease without esophagitis: Secondary | ICD-10-CM | POA: Diagnosis not present

## 2018-02-20 DIAGNOSIS — M48061 Spinal stenosis, lumbar region without neurogenic claudication: Secondary | ICD-10-CM | POA: Diagnosis not present

## 2018-02-20 DIAGNOSIS — G248 Other dystonia: Secondary | ICD-10-CM | POA: Diagnosis present

## 2018-02-20 DIAGNOSIS — Z79899 Other long term (current) drug therapy: Secondary | ICD-10-CM | POA: Diagnosis not present

## 2018-02-20 DIAGNOSIS — G20 Parkinson's disease: Secondary | ICD-10-CM | POA: Diagnosis not present

## 2018-02-20 NOTE — Patient Instructions (Signed)
Will do nerve blocks to Right sacroiliac joint next visit

## 2018-02-20 NOTE — Progress Notes (Signed)
Botox Injection for spasticity using needle EMG guidance  Dilution: 50 Units/ml Indication: Severe spasticity which interferes with ADL,mobility and/or  hygiene and is unresponsive to medication management and other conservative care Informed consent was obtained after describing risks and benefits of the procedure with the patient. This includes bleeding, bruising, infection, excessive weakness, or medication side effects. A REMS form is on file and signed. Needle: 27g 1" for foot muscles, 25g 2" for leg muscles needle electrode Number of units per muscle Right flexor hall longus 50 units Right flexor digitorum longus 50 units Right flexor hallucis brevis 25 units Right flexor digitorum brevis 25 units RIght tibialis posterior 50 units  All injections were done after obtaining appropriate EMG activity and after negative drawback for blood. The patient tolerated the procedure well. Post procedure instructions were given. A followup appointment was made.   Complains of severe Right buttock and hip pain, relieved by SI x few days injection, will schedule Right L5 Dorsal ramus. Right S1-2-3 lateral branch blocks

## 2018-03-21 ENCOUNTER — Encounter: Payer: Medicare Other | Attending: Physical Medicine & Rehabilitation

## 2018-03-21 ENCOUNTER — Ambulatory Visit (HOSPITAL_BASED_OUTPATIENT_CLINIC_OR_DEPARTMENT_OTHER): Payer: Medicare Other | Admitting: Physical Medicine & Rehabilitation

## 2018-03-21 ENCOUNTER — Encounter: Payer: Self-pay | Admitting: Physical Medicine & Rehabilitation

## 2018-03-21 ENCOUNTER — Other Ambulatory Visit: Payer: Self-pay

## 2018-03-21 VITALS — BP 140/95 | HR 95 | Ht 74.0 in | Wt 227.2 lb

## 2018-03-21 DIAGNOSIS — M533 Sacrococcygeal disorders, not elsewhere classified: Secondary | ICD-10-CM | POA: Diagnosis not present

## 2018-03-21 DIAGNOSIS — I10 Essential (primary) hypertension: Secondary | ICD-10-CM | POA: Diagnosis not present

## 2018-03-21 DIAGNOSIS — Z79899 Other long term (current) drug therapy: Secondary | ICD-10-CM | POA: Diagnosis not present

## 2018-03-21 DIAGNOSIS — M48061 Spinal stenosis, lumbar region without neurogenic claudication: Secondary | ICD-10-CM | POA: Diagnosis not present

## 2018-03-21 DIAGNOSIS — K219 Gastro-esophageal reflux disease without esophagitis: Secondary | ICD-10-CM | POA: Diagnosis not present

## 2018-03-21 DIAGNOSIS — G4733 Obstructive sleep apnea (adult) (pediatric): Secondary | ICD-10-CM | POA: Diagnosis not present

## 2018-03-21 DIAGNOSIS — E039 Hypothyroidism, unspecified: Secondary | ICD-10-CM | POA: Diagnosis not present

## 2018-03-21 DIAGNOSIS — G8929 Other chronic pain: Secondary | ICD-10-CM | POA: Diagnosis not present

## 2018-03-21 DIAGNOSIS — Z79890 Hormone replacement therapy (postmenopausal): Secondary | ICD-10-CM | POA: Diagnosis not present

## 2018-03-21 DIAGNOSIS — G248 Other dystonia: Secondary | ICD-10-CM

## 2018-03-21 DIAGNOSIS — G20 Parkinson's disease: Secondary | ICD-10-CM | POA: Diagnosis not present

## 2018-03-21 NOTE — Progress Notes (Signed)
  PROCEDURE RECORD [facility]   Name: Patrick Ross DOB:Oct 12, 1945 MRN: [id number]  Date:03/21/2018  Physician: Alysia Penna, MD    Nurse/CMA:  Bright CMA  Allergies:  Allergies  Allergen Reactions  . Other Other (See Comments)    Surgical spray prior to adhesive causes blisters  . Sulfamethoxazole Other (See Comments)    Unsure--childhood allergy    Consent Signed: Yes.    Is patient diabetic? No.  CBG today? NA  Pregnant: No. LMP: No LMP for male patient. (age 7-55)  Anticoagulants: no Anti-inflammatory: no Antibiotics: no  Procedure: Right L5, S1-3 LBB Position: Prone   Start Time: 139pm End Time: 150pm Fluoro Time49s  RN/CMA Shumaker RN Bright CMA    Time 12:48 152pm    BP 140/95 165/89    Pulse 95 101    Respirations 14 16    O2 Sat 95 95    S/S 6 6    Pain Level 5/10 3/10     D/C home with wife Patrick Ross, patient A & O X 3, D/C instructions reviewed, and sits independently.

## 2018-03-21 NOTE — Patient Instructions (Signed)
Will repeat sacroiliac block if it only gives temporary relief

## 2018-03-21 NOTE — Progress Notes (Signed)
L5 dorsal ramus S1-S2-S3 lateral branch blocks under fluoroscopic guidance Right side  Informed consent was obtained after describing risks and benefits of the procedure with patient these include bleeding bruising and infection.  He elects to proceed and has given written consent.  Patient placed prone on fluoroscopy table Betadine prep sterile drape a 25-gauge 1.5 inch needle was used to anesthetize skin and subcu tissue with 1% lidocaine 1 cc into each of 4 sites.  Then a 22-gauge 3.5" needle was inserted under fluoroscopic guidance for starting the S1 SAP sacral ala junction.  Bone contact made.  Isovue 200 x 0.5 mL demonstrated no intravascular uptake then 0.5 mL of 2% lidocaine was injected.  Then the lateral aspect of the S1, S2, S3 foramen was targeted.  Bone contact made out, Isovue-200 times 0.5 mL demonstrated no nerve root or intravascular uptake within 0.5 mL of 2% lidocaine solution was injected after negative drawback for blood.  Patient tolerated procedure well.  Postinjection instructions given. 

## 2018-04-03 ENCOUNTER — Telehealth: Payer: Self-pay | Admitting: Neurology

## 2018-04-03 ENCOUNTER — Other Ambulatory Visit: Payer: Self-pay | Admitting: *Deleted

## 2018-04-03 ENCOUNTER — Other Ambulatory Visit: Payer: Self-pay | Admitting: Neurology

## 2018-04-03 MED ORDER — AMANTADINE HCL 100 MG PO CAPS: ORAL_CAPSULE | ORAL | 0 refills | Status: DC

## 2018-04-03 NOTE — Telephone Encounter (Signed)
Rx sent in

## 2018-04-03 NOTE — Telephone Encounter (Signed)
Patient's wife called needing to get a refill for 1 week worth of his Amantadine 100 MG medication. She would like it sent to [facility] on [address]. She said she will also contact [facility] for the refill. Thanks

## 2018-04-04 NOTE — Telephone Encounter (Signed)
Message from patient

## 2018-05-07 NOTE — Progress Notes (Signed)
Patrick Ross was seen today in the movement disorders clinic for neurologic consultation at the request of Jefm Petty, MD. This patient is accompanied in the office by his spouse who supplements the history. The consultation is for the evaluation of PD.  Multiple medical records are reviewed regarding the patient's history.  He has been under the care of Dr. Linus Mako.  The patient last saw him on May 02, 2017.  The patient was diagnosed with Parkinson's disease in approximately 2000.  First sx was decreased L arm swing.    The patient underwent right STN DBS on December 10, 2011 and a left STN DBS on December 27, 2012.  Postoperatively, his left STN DBS lead was found to be displaced and had revision of the left lead on February 02, 2013.  Pt thinks that the L side (right body) has not been as effective as still has curling of the foot, even with the botox.    Patient's current movement disorder medications: Stalevo 200 mg 4 times per day (7/11/3/7) On carbidopa/levodopa 25/100 1 tablet as needed 3 times per day (7/11/3pm) And carbidopa/levodopa 50/200 at bedtime Amantadine 100 mg 3 times per day Clonazepam 0.5 mg at bedtime (for sleep but cpap has helped a lot.  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  Wife states that surgery did help with dyskinesia and tremor.    Most recent notes from Dr. Deboraha Sprang indicate that patients biggest issues now are things that are unresponsive to medications.  He has fatigue, apathy, gait imbalance and profound speech change.  He doesn't think that PD has contributed to speech change.   Specific Symptoms:  Tremor: No., not now.  Early on had a little R hand tremor Family hx of similar:  No. Voice: trouble with clarity, volume, word finding.  Last saw ST several years ago Sleep: sleeping better with CPAP  Vivid Dreams:  Yes.    Acting out dreams:  Yes.   Postural symptoms:   Yes.  , last PT years ago.  Does [employer] cycle class  Falls?  Yes.  , but felt due to having wrong walker  Bradykinesia symptoms: difficulty getting out of a chair (has lift chair at home); some shuffling of the feet Loss of smell:  Yes.   Loss of taste:  No. Urinary Incontinence:  No. Difficulty Swallowing:  Yes.  , just minor with dry food Handwriting, micrographia: Yes.   Trouble with ADL's:  No.  Trouble buttoning clothing: No. Depression:  No. but some motivation issues Memory changes:  Yes.  , some short term issues.  Very little driving now.  Drives to [employer] and [employer].  May drift to the left.  Uses alarm as reminder to take meds Hallucinations:  No.  visual distortions: No. N/V:  No. Lightheaded:  Yes.   , occasionally  Syncope: No. Diplopia:  No., some blurry Dyskinesia:  Yes.     01/16/18 update: Patient is seen today in follow-up.  He had an MRI of the brain since our last visit.  Left DBS lead looked to be several mm deeper than the R lead.  Other records have been reviewed since our last visit as well.  He was referred to Dr. Letta Pate for Botox injections for foot dystonia.  He saw Dr. Letta Pate on September 20, 2017.  He states that he had the injections and it helped.  He has also had injections  for SI joint pain and found those beneficial.  He has another appt on 01/30/18 for his Botox.   Patient has been going faithfully to the neuro rehab center for PT/OT/ST.  He thinks that is helping esp his speech.  No falls since our last visit.   Current movement disorder medications include: Stalevo 200 mg 4 times per day (7/11/3/7) On carbidopa/levodopa 25/100 1 tablet as needed 3 times per day (7/11/3pm) carbidopa/levodopa 50/200 at bedtime Amantadine 100 mg 3 times per day Clonazepam 0.5 mg at bedtime   05/08/18 update:  Pt seen in f/u for PD.  This patient is accompanied in the office by his spouse who supplements the history.  Pt is on stalevo 200 mg qid and then takes an  additional carbidopa/levodopa 25/100 tid (was on that when he came to me).  On carbidopa/levodopa 50/200 at bedtime.  Some dizziness.  On amantadine 100 mg tid but not sure that he is helping as he is still moving a lot.  On klonopin 0.5 mg q hs.  By Alfredia Client, he is moving and laughing a lot in the sleep.  The records that were made available to me were reviewed.  Had IPG changed on 02/02/18.  Continues to see Dr. Letta Pate for feet botox for feet dystonia.  Last saw him on 03/21/18 but that was for back pain and L5/S1 blocks.  He has an appointment for the botox on 3/17.  He has an appointment on Friday for SI joint injection.  He is mostly limited by pain but wife states that limited by endurance and fatigue as well.     PREVIOUS MEDICATIONS: none to date  ALLERGIES:   Allergies  Allergen Reactions  . Other Other (See Comments)    Surgical spray prior to adhesive causes blisters  . Sulfamethoxazole Other (See Comments)    Unsure--childhood allergy    CURRENT MEDICATIONS:  Outpatient Encounter Medications as of 05/08/2018  Medication Sig  . acetaminophen (TYLENOL) 325 MG tablet Take 325 mg by mouth every 6 (six) hours as needed (pain).   Marland Kitchen amantadine (SYMMETREL) 100 MG capsule TAKE 1 CAPSULE THREE TIMES A DAY AT 7 A.M., 1 P.M., AND 7 P.M.  . Ascorbic Acid (VITAMIN C) 100 MG tablet Take 100 mg by mouth daily.  . carbidopa-levodopa (SINEMET CR) 50-200 MG tablet Take 1 tablet by mouth at bedtime.  . carbidopa-levodopa (SINEMET IR) 25-100 MG tablet Take 1 tablet by mouth 4 (four) times daily. 7am/11am/3pm/7pm  . carbidopa-levodopa-entacapone (STALEVO) 50-200-200 MG tablet Take 1 tablet by mouth 4 (four) times daily. 7am/11am/3pm/7pm  . Cholecalciferol (VITAMIN D3) 2000 units capsule Take 2,000 Units by mouth daily.   . clonazePAM (KLONOPIN) 0.5 MG tablet Take 1 tablet (0.5 mg total) by mouth at bedtime.  . gabapentin (NEURONTIN) 300 MG capsule Take 1 capsule by mouth 2 (two) times daily.   .  Levothyroxine Sodium 137 MCG CAPS Take 137 mcg by mouth daily at 2 PM.  . lisinopril (PRINIVIL,ZESTRIL) 20 MG tablet Take 1 tablet by mouth daily.  . Magnesium Oxide 400 MG CAPS Take 1 capsule by mouth daily.   . Multiple Vitamin (MULTIVITAMIN) capsule Take 1 capsule by mouth daily.   . Omega-3 Fatty Acids (FISH OIL) 1000 MG CAPS Take by mouth.  . Testosterone 10 MG/ACT (2%) GEL Place 6 Pump onto the skin daily. 3 PUMPS TO EACH THIGH  . TURMERIC PO Take 1 tablet by mouth daily.   . Vitamin Mixture (VITAMIN E COMPLETE) CAPS Take 1 capsule  by mouth daily.    No facility-administered encounter medications on file as of 05/08/2018.     PAST MEDICAL HISTORY:   Past Medical History:  Diagnosis Date  . GERD (gastroesophageal reflux disease)   . History of kidney stones   . Hypertension   . Hypothyroidism   . Lumbar stenosis   . OSA (obstructive sleep apnea)    wears cpap  . Parkinson's disease ([place])   . PONV (postoperative nausea and vomiting)   . REM behavioral disorder   . Urinary incontinence     PAST SURGICAL HISTORY:   Past Surgical History:  Procedure Laterality Date  . BACK SURGERY    . DEEP BRAIN STIMULATOR PLACEMENT     left 12/27/2012, right 12/10/2011, left revision 02/02/2013, replacement bilateral 11/21/2015  . KNEE SURGERY    . NEPHRECTOMY    . SPINAL FUSION    . SUBTHALAMIC STIMULATOR BATTERY REPLACEMENT Left 02/02/2018   Procedure: Change implantable pulse generator battery, Left chest;  Surgeon: Erline Levine, MD;  Location: [facility];  Service: Neurosurgery;  Laterality: Left;  . TONSILLECTOMY      SOCIAL HISTORY:   Social History   Socioeconomic History  . Marital status: Married    Spouse name: Not on file  . Number of children: Not on file  . Years of education: Not on file  . Highest education level: Not on file  Occupational History  . Not on file  Social Needs  . Financial resource strain: Not on file  . Food insecurity:    Worry: Not on file     Inability: Not on file  . Transportation needs:    Medical: Not on file    Non-medical: Not on file  Tobacco Use  . Smoking status: Never Smoker  . Smokeless tobacco: Never Used  Substance and Sexual Activity  . Alcohol use: Not Currently  . Drug use: Never  . Sexual activity: Not on file  Lifestyle  . Physical activity:    Days per week: Not on file    Minutes per session: Not on file  . Stress: Not on file  Relationships  . Social connections:    Talks on phone: Not on file    Gets together: Not on file    Attends religious service: Not on file    Active member of club or organization: Not on file    Attends meetings of clubs or organizations: Not on file    Relationship status: Not on file  . Intimate partner violence:    Fear of current or ex partner: Not on file    Emotionally abused: Not on file    Physically abused: Not on file    Forced sexual activity: Not on file  Other Topics Concern  . Not on file  Social History Narrative  . Not on file    FAMILY HISTORY:   Family Status  Relation Name Status  . Mother  Alive  . Father  Deceased  . Brother  Alive  . Child x3 Alive    ROS:  Review of Systems  Constitutional: Negative.   HENT: Negative.   Eyes: Negative.   Respiratory: Negative.   Cardiovascular: Negative.   Genitourinary: Negative.   Musculoskeletal: Positive for back pain.  Skin: Negative.     PHYSICAL EXAMINATION:    VITALS:   Vitals:   05/08/18 1401  BP: (!) 148/80  Pulse: 95  SpO2: 96%  Weight: 223 lb (101.2 kg)  Height: 6\' 1"  (1.854 m)  GEN:  The patient appears stated age and is in NAD. HEENT:  Normocephalic, atraumatic.  The mucous membranes are moist. The superficial temporal arteries are without ropiness or tenderness. CV:  RRR Lungs:  CTAB Neck/HEME:  There are no carotid bruits bilaterally.  Neurological examination:  Orientation: The patient is alert and oriented x3. Cranial nerves: There is good facial symmetry.  The speech is fluent and dysarthric. Soft palate rises symmetrically and there is no tongue deviation. Hearing is intact to conversational tone. Sensation: Sensation is intact to light touch throughout Motor: Strength is 5/5 in the bilateral upper and lower extremities.   Shoulder shrug is equal and symmetric.  There is no pronator drift.  Movement examination: Tone: There is normal tone in the upper and lower extremities. Abnormal movements: There is dyskinesia in the right arm and right leg. Coordination:  There is slowness and decremation with finger taps, hand opening and closing and toe taps on the right. Gait and Station: The patient pushes off of the chair to arise.  He uses the U step.  He has some ataxia, even with a U step, but generally will walks fairly well with that.   ASSESSMENT/PLAN:  1.  Idiopathic Parkinson's disease, diagnosed in the year 2000  -The patient underwent right STN DBS on December 10, 2011 and a left STN DBS on December 27, 2012.  Postoperatively, his left STN DBS lead was found to be displaced and had revision of the left lead on February 02, 2013 (pulled back 1 cm without MER).  His IPG was last changed on February 02, 2018.  -Postop images done in 2019 indicate that the left lead still appears to be inferior in location compared to that of the right lead, despite the fact that this is been revised on one occasion.  He asked me again about revising that and is seriously considering a revision.  He is frustrated very much that he has not been able to reduce his medication load and is having right-sided dyskinesia, speech difficulties, and difficulty with more bradykinesia on the right side.  He and I discussed extensively the risks, benefits, and side effects of going in a third time for surgery.  He very much wants to consider this.  He asked me to go ahead and get the neurocognitive testing done with Dr. Melvyn Novas in the summer.  -For now, I did not change his Stalevo, 200 mg  4 times per day and then his additional carbidopa/levodopa 25/103 times per day.  He is also on carbidopa/levodopa 50/200 at bedtime.  -Discontinue amantadine 100 mg 3 times per day.  -Start osmolex, 129mg +193 mg to make 322 mg.  Samples given.  He will let me know how he does.  Risks, benefits, side effects and alternative therapies were discussed.  The opportunity to ask questions was given and they were answered to the best of my ability.  The patient expressed understanding and willingness to follow the outlined treatment protocols.  -Patient is receiving Botox from Dr. Letta Pate for feet dystonia and that is helping  -Talked about the value of exercising.  I would recommend [facility].  Patient literature was given.  2.  REM behavior disorder  -For now, he will continue clonazepam, 0.5 mg daily.  I may need to increase that somewhat, but I am leery about doing that.  We will address this further next visit.  3.  I will plan on seeing him back in the next 8 weeks, sooner should new neurologic  issues arise.  I spent 40 minutes face-to-face with the patient, greater than 50% of which was in counseling.  This did not include DBS programming time.  Cc:  Jefm Petty, MD

## 2018-05-08 ENCOUNTER — Encounter: Payer: Self-pay | Admitting: Neurology

## 2018-05-08 ENCOUNTER — Ambulatory Visit (INDEPENDENT_AMBULATORY_CARE_PROVIDER_SITE_OTHER): Payer: Medicare Other | Admitting: Neurology

## 2018-05-08 VITALS — BP 148/80 | HR 95 | Ht 73.0 in | Wt 223.0 lb

## 2018-05-08 DIAGNOSIS — R471 Dysarthria and anarthria: Secondary | ICD-10-CM | POA: Diagnosis not present

## 2018-05-08 DIAGNOSIS — G20 Parkinson's disease: Secondary | ICD-10-CM

## 2018-05-08 DIAGNOSIS — G4752 REM sleep behavior disorder: Secondary | ICD-10-CM

## 2018-05-08 DIAGNOSIS — G249 Dystonia, unspecified: Secondary | ICD-10-CM | POA: Diagnosis not present

## 2018-05-08 MED ORDER — AMANTADINE HCL ER 193 MG PO TB24: 193.0000 mg | ORAL_TABLET | Freq: Every day | ORAL | 0 refills | Status: DC

## 2018-05-08 MED ORDER — AMANTADINE HCL ER 129 MG PO TB24: 129.0000 mg | ORAL_TABLET | Freq: Every day | ORAL | 0 refills | Status: DC

## 2018-05-08 NOTE — Procedures (Signed)
DBS Programming was performed.    Total time spent programming was 10 minutes.  Device was confirmed to be on.  Soft start was confirmed to be on.  Impedences were checked and were within normal limits.  Battery was checked and was 2.93 on the left.   Battery on the right was normal at 2.87.  Final settings were as follows:  Left brain electrode:     2-1+           ; Amplitude  4.7   V   ; Pulse width 90 microseconds;   Frequency   135   Hz.  Right brain electrode:     3-C+          ; Amplitude   3.2  V ;  Pulse width 90  microseconds;  Frequency   135    Hz.

## 2018-05-09 ENCOUNTER — Telehealth: Payer: Self-pay | Admitting: Neurology

## 2018-05-09 NOTE — Telephone Encounter (Signed)
Left message on machine for patient to call back.  To let him know we need to get his signature on forms for Osmolex. Awaiting call back to let him know.

## 2018-05-09 NOTE — Telephone Encounter (Signed)
Patient is returning your call he left a VM

## 2018-05-09 NOTE — Telephone Encounter (Signed)
Patient made aware we will need his signature for Osmolex forms. He is aware. He is trying the samples and will see how he does on it. If he does well will come sign forms so we can send to company.

## 2018-05-16 ENCOUNTER — Other Ambulatory Visit: Payer: Self-pay

## 2018-05-16 ENCOUNTER — Ambulatory Visit (HOSPITAL_BASED_OUTPATIENT_CLINIC_OR_DEPARTMENT_OTHER): Payer: Medicare Other | Admitting: Physical Medicine & Rehabilitation

## 2018-05-16 ENCOUNTER — Encounter: Payer: Self-pay | Admitting: Physical Medicine & Rehabilitation

## 2018-05-16 ENCOUNTER — Encounter: Payer: Medicare Other | Attending: Physical Medicine & Rehabilitation

## 2018-05-16 VITALS — BP 120/80 | HR 85 | Ht 74.0 in | Wt 216.0 lb

## 2018-05-16 DIAGNOSIS — Z79890 Hormone replacement therapy (postmenopausal): Secondary | ICD-10-CM | POA: Insufficient documentation

## 2018-05-16 DIAGNOSIS — I10 Essential (primary) hypertension: Secondary | ICD-10-CM | POA: Diagnosis not present

## 2018-05-16 DIAGNOSIS — K219 Gastro-esophageal reflux disease without esophagitis: Secondary | ICD-10-CM | POA: Insufficient documentation

## 2018-05-16 DIAGNOSIS — E039 Hypothyroidism, unspecified: Secondary | ICD-10-CM | POA: Insufficient documentation

## 2018-05-16 DIAGNOSIS — G4733 Obstructive sleep apnea (adult) (pediatric): Secondary | ICD-10-CM | POA: Diagnosis not present

## 2018-05-16 DIAGNOSIS — G248 Other dystonia: Secondary | ICD-10-CM | POA: Insufficient documentation

## 2018-05-16 DIAGNOSIS — M48061 Spinal stenosis, lumbar region without neurogenic claudication: Secondary | ICD-10-CM | POA: Insufficient documentation

## 2018-05-16 DIAGNOSIS — G20 Parkinson's disease: Secondary | ICD-10-CM | POA: Diagnosis not present

## 2018-05-16 DIAGNOSIS — Z79899 Other long term (current) drug therapy: Secondary | ICD-10-CM | POA: Insufficient documentation

## 2018-05-16 NOTE — Progress Notes (Signed)
Botox Injection for spasticity using needle EMG guidance  Dilution: 50 Units/ml Indication: Severe spasticity which interferes with ADL,mobility and/or  hygiene and is unresponsive to medication management and other conservative care Informed consent was obtained after describing risks and benefits of the procedure with the patient. This includes bleeding, bruising, infection, excessive weakness, or medication side effects. A REMS form is on file and signed. Needle: 27g 1" for foot muscles, 25g 2" for leg muscles needle electrode Number of units per muscle Right flexor hall longus 50 units Right flexor digitorum longus 50 units Right flexor hallucis brevis 25 units Right flexor digitorum brevis 25 units RIght tibialis posterior 50 units  All injections were done after obtaining appropriate EMG activity and after negative drawback for blood. The patient tolerated the procedure well. Post procedure instructions were given. A followup appointment was made.   Complains of severe Right buttock and hip pain, relieved by SI x few days injection, will schedule Right L5 Dorsal ramus. Right S1-2-3 lateral branch blocks

## 2018-05-16 NOTE — Patient Instructions (Signed)

## 2018-05-19 ENCOUNTER — Ambulatory Visit: Payer: Medicare Other | Admitting: Physical Medicine & Rehabilitation

## 2018-05-19 ENCOUNTER — Ambulatory Visit: Payer: Medicare Other | Admitting: Registered Nurse

## 2018-05-22 ENCOUNTER — Other Ambulatory Visit: Payer: Self-pay | Admitting: Neurology

## 2018-06-02 ENCOUNTER — Other Ambulatory Visit: Payer: Self-pay | Admitting: *Deleted

## 2018-06-02 ENCOUNTER — Encounter: Payer: Self-pay | Admitting: *Deleted

## 2018-06-02 MED ORDER — AMANTADINE HCL ER 193 MG PO TB24: 1.0000 | ORAL_TABLET | Freq: Every day | ORAL | 0 refills | Status: DC

## 2018-06-05 ENCOUNTER — Ambulatory Visit (HOSPITAL_BASED_OUTPATIENT_CLINIC_OR_DEPARTMENT_OTHER): Payer: Medicare Other | Admitting: Physical Medicine & Rehabilitation

## 2018-06-05 ENCOUNTER — Encounter: Payer: Self-pay | Admitting: Physical Medicine & Rehabilitation

## 2018-06-05 ENCOUNTER — Encounter: Payer: Medicare Other | Attending: Physical Medicine & Rehabilitation

## 2018-06-05 ENCOUNTER — Other Ambulatory Visit: Payer: Self-pay

## 2018-06-05 VITALS — BP 136/79 | HR 83 | Temp 98.3°F | Ht 73.0 in | Wt 228.0 lb

## 2018-06-05 DIAGNOSIS — M533 Sacrococcygeal disorders, not elsewhere classified: Secondary | ICD-10-CM

## 2018-06-05 DIAGNOSIS — E039 Hypothyroidism, unspecified: Secondary | ICD-10-CM | POA: Insufficient documentation

## 2018-06-05 DIAGNOSIS — G20 Parkinson's disease: Secondary | ICD-10-CM | POA: Diagnosis not present

## 2018-06-05 DIAGNOSIS — G4733 Obstructive sleep apnea (adult) (pediatric): Secondary | ICD-10-CM | POA: Diagnosis not present

## 2018-06-05 DIAGNOSIS — G8929 Other chronic pain: Secondary | ICD-10-CM | POA: Diagnosis not present

## 2018-06-05 DIAGNOSIS — K219 Gastro-esophageal reflux disease without esophagitis: Secondary | ICD-10-CM | POA: Insufficient documentation

## 2018-06-05 DIAGNOSIS — I10 Essential (primary) hypertension: Secondary | ICD-10-CM | POA: Diagnosis not present

## 2018-06-05 DIAGNOSIS — G248 Other dystonia: Secondary | ICD-10-CM | POA: Insufficient documentation

## 2018-06-05 DIAGNOSIS — M48061 Spinal stenosis, lumbar region without neurogenic claudication: Secondary | ICD-10-CM | POA: Diagnosis not present

## 2018-06-05 DIAGNOSIS — Z79890 Hormone replacement therapy (postmenopausal): Secondary | ICD-10-CM | POA: Diagnosis not present

## 2018-06-05 DIAGNOSIS — Z79899 Other long term (current) drug therapy: Secondary | ICD-10-CM | POA: Diagnosis not present

## 2018-06-05 NOTE — Patient Instructions (Addendum)
You had a radio frequency procedure today This was done to alleviate joint pain in your sacroiliac We injected lidocaine which is a local anesthetic.  You may experience soreness at the injection sites. You may also experienced some irritation of the nerves that were heated I'm recommending ice for 30 minutes every 2 hours as needed for the next 24-48 hours

## 2018-06-05 NOTE — Progress Notes (Signed)
R Sacroiliac radio frequency ablation under fluoroscopic guidance This consists of L5 dorsal ramus radio frequency ablation plus neuro lysis of S1-S2 -S3 lateral branches  Indication is sacroiliac pain which has improved temporarily onby at least 50% following sacroiliac intra-articular injection and L5 , S1,2,3 Lateral branch blocks under fluoroscopic guidance. Pain interferes with self-care and mobility and has failed to respond to conservative measures.  Informed consent was obtained after discussing risks and benefits of the procedure with the patient these include bleeding bruising and infection temporary or permanent paralysis. The patient elects to proceed and has given written consent.  Patient placed prone on fluoroscopy table. Area marked and prepped with Betadine. Fluoroscopic images utilized to guide needle. 25-gauge 1.5 inch needle was used to anesthetize 4 injection points with 2 cc of 1% lidocaine each. Then a 18-gauge 10 cm RF needle with a 10 mm curved active tip was inserted under fluoroscopic guidance first targeting the S1 SA P./sacral ala junction, bone contact made and confirmed with lateral imaging.  motor stimulation at 2 Hz confirm proper needle location followed by injection of one cc of a solution containing   1% lidocaine MPF. Radio frequency 80C for 80 seconds was performed. Then the inferolateral aspect of the S1, S2 and lateral aspect of S3 sacral foramina were targeted. Bone contact made.  motor stim at 2 Hz confirm proper needle location. One ML of the /lidocaine solution was injected into each of 3 sites and radio frequency ablation 80C for 90 seconds was performed. Patient tolerated procedure well. Post procedure instructions given

## 2018-06-05 NOTE — Progress Notes (Signed)
  PROCEDURE RECORD [facility]   Name: NAHIEM DREDGE DOB:05-Feb-1946 MRN: [id number]  Date:06/05/2018  Physician: Alysia Penna, MD    Nurse/CMA: Wessling CMA  Allergies:  Allergies  Allergen Reactions  . Other Other (See Comments)    Surgical spray prior to adhesive causes blisters  . Sulfamethoxazole Other (See Comments)    Unsure--childhood allergy    Consent Signed: Yes.    Is patient diabetic? No.  CBG today?   Pregnant: No. LMP: No LMP for male patient. (age 110-55)  Anticoagulants: no Anti-inflammatory: no Antibiotics: no  Procedure: right sacroiliac radiofrequency neurotomy Position: Prone Start Time: 3:20pm End Time:3:55pm   Fluoro Time:1:01`s  RN/CMA Sports administrator CMA    Time 2:52 4:06pm    BP 136/79 169/96    Pulse 83 91    Respirations 14 14    O2 Sat 96 95    S/S 6 6    Pain Level 9/10 2/10     D/C home with wife , patient A & O X 3, D/C instructions reviewed, and sits independently.

## 2018-06-07 ENCOUNTER — Encounter

## 2018-06-07 ENCOUNTER — Ambulatory Visit: Payer: Medicare Other | Admitting: Neurology

## 2018-06-12 ENCOUNTER — Telehealth: Payer: Self-pay | Admitting: Occupational Therapy

## 2018-06-12 NOTE — Telephone Encounter (Signed)
Patrick Ross was contacted today regarding the temporary closing of [facility] due to Covid-19.  Therapist left pt a message that his upcoming PD screen has been cancelled. We will contact him to schedule at a later date.  [facility] will follow up with patients when we are able to reschedule this screen care.  Theone Murdoch, OTR/L [phone] Phone: [phone] 4:49 PM 06/12/18 [facility] [address] [place], [place]  [address] Phone:  [phone] Fax:  [phone] \

## 2018-06-16 ENCOUNTER — Ambulatory Visit: Payer: Medicare Other | Admitting: Physical Medicine & Rehabilitation

## 2018-06-27 ENCOUNTER — Encounter: Payer: Self-pay | Admitting: *Deleted

## 2018-06-27 NOTE — Progress Notes (Signed)
Virtual Visit via Video Note The purpose of this virtual visit is to provide medical care while limiting exposure to the novel coronavirus.    Consent was obtained for video visit:  Yes.   Answered questions that patient had about telehealth interaction:  Yes.   I discussed the limitations, risks, security and privacy concerns of performing an evaluation and management service by telemedicine. I also discussed with the patient that there may be a patient responsible charge related to this service. The patient expressed understanding and agreed to proceed.  Pt location: Home Physician Location: home Name of referring provider:  Jefm Petty, MD I connected with Patrick Ross at patients initiation/request on 06/28/2018 at  9:00 AM EDT by video enabled telemedicine application and verified that I am speaking with the correct person using two identifiers. Pt MRN:  [id number] Pt DOB:  [birth year] Video Participants:  Patrick Ross;  Wife    History of Present Illness:  Pt seen in f/u for PD.  Pt is on Stalevo, 200 mg qid and an additional carbidopa/levodopa 25/100 tid.  He is also on carbidopa/levodopa 50/200 at bedtime.  I stopped his amantadine and started osmolex.  He had dizziness on the higher dosage of osmolex (the 129mg  + 193 mg) but it was working well and I told him to try the 193 mg.  Wife states that he is back to jerking and she notes that it comes and goes and isn't sure if the med is working but it may be (because dyskinesia is gone at times).   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  He has been having more back and hip pain.  Patrick Ross offered an in person appt but the patient declined that.  He is having bladder issues - he has an urge to go and he cannot get there fast enough.  States that he will get a pain in the back and just cannot get there fast enough.     He wears undergarments.  Has pads for the bed.  Wife thinks that because of the pain in the back,  he feels like he cannot get there fast enough or legs will get weak.  He did have a fall the other day.  Was going down a ramp with the walker and fell.  Didn't get hurt.  Doesn't know exactly how fall.     Observations/Objective:   Vitals:   06/28/18 0855  BP: (!) 154/95  Temp: 98.6 F (37 C)  Weight: 217 lb (98.4 kg)   GEN:  The patient appears stated age and is in NAD.  Neurological examination:  Orientation: The patient is alert and oriented x3. Cranial nerves: There is good facial symmetry. There is facial hypomimia.  The speech is fluent and dysarthric. Soft palate rises symmetrically and there is no tongue deviation. Hearing is intact to conversational tone. Motor: Strength is at least antigravity x 4.   Shoulder shrug is equal and symmetric.  There is no pronator drift.  Movement examination: Tone: unable Abnormal movements: There is mild RUE/RLE dyskinesia.  Mild head dyskinesia Coordination:  There is mild decremation with RAM's,  Gait and Station: The patient pushes off of his chair (which is just a rolling chair).  He ambulates with his walker.  He has slight foot drop, but was fairly stable with his walker.  Assessment and Plan:   1.  Idiopathic Parkinson's disease, diagnosed in the year 2000             -The patient underwent right STN DBS on December 10, 2011 and a left STN DBS on December 27, 2012.  Postoperatively, his left STN DBS lead was found to be displaced and had revision of the left lead on February 02, 2013 (pulled back 1 cm without MER).  His IPG was last changed on February 02, 2018.             -Postop images done in 2019 indicate that the left lead still appears to be inferior in location compared to that of the right lead, despite the fact that this is been revised on one occasion.  He asked me again about revising that and is seriously considering a  revision.  He is frustrated very much that he has not been able to reduce his medication load and is having right-sided dyskinesia, speech difficulties, and difficulty with more bradykinesia on the right side.  He and I discussed extensively the risks, benefits, and side effects of going in a third time for surgery.  He very much wants to consider this.  He asked me to go ahead and get the neurocognitive testing done with Patrick Ross in the summer.             -He had side effects (dizziness) when he used osmolex, 129mg +193 mg to make 322 mg, but it was efficacious.  I then discontinued the 129 mg and just used the 193 mg.   They are not sure that it is effective now.  I am going to have them hold it for the next 5 days and they will call me.  His wife will pay attention to the amount of dyskinesia that he has.             -Patient is receiving Botox from Patrick Ross for feet dystonia and that is helping  2.  REM behavior disorder             -For now, he will continue clonazepam, 0.5 mg daily but I will cautiously increase that medication to 1.5 tablets nightly.  He will let me know if he has any hangover effect or feels more weak in the middle of the night.  He will let me know when he needs a refill.  3.  urinary incontinence  -referral to [facility]  4.  Low back pain  -Patient instructed to make an appointment with Patrick Ross.  He was offered an appointment recently, but declined to the appointment.  Encouraged him to go ahead and make that.   Follow Up Instructions:    -I discussed the assessment and treatment plan with the patient. The patient was provided an opportunity to ask questions and all were answered. The patient agreed with the plan and demonstrated an understanding of the instructions.   The patient was advised to call back or seek an in-person evaluation if the symptoms worsen or if the condition fails to improve as anticipated.    Total Time spent in visit with the  patient was:  25 min, of which more than 50% of the time was spent in counseling and/or coordinating care on safety.   Pt understands and agrees with the plan of care outlined.     Patrick Bogus, DO

## 2018-06-28 ENCOUNTER — Other Ambulatory Visit: Payer: Self-pay | Admitting: *Deleted

## 2018-06-28 ENCOUNTER — Telehealth (INDEPENDENT_AMBULATORY_CARE_PROVIDER_SITE_OTHER): Payer: Medicare Other | Admitting: Neurology

## 2018-06-28 ENCOUNTER — Other Ambulatory Visit: Payer: Self-pay

## 2018-06-28 ENCOUNTER — Telehealth: Payer: Self-pay | Admitting: Neurology

## 2018-06-28 DIAGNOSIS — R32 Unspecified urinary incontinence: Secondary | ICD-10-CM

## 2018-06-28 DIAGNOSIS — G20B1 Parkinson's disease with dyskinesia, without mention of fluctuations: Secondary | ICD-10-CM

## 2018-06-28 DIAGNOSIS — G20 Parkinson's disease: Secondary | ICD-10-CM | POA: Diagnosis not present

## 2018-06-28 DIAGNOSIS — G249 Dystonia, unspecified: Secondary | ICD-10-CM | POA: Diagnosis not present

## 2018-06-28 DIAGNOSIS — G4752 REM sleep behavior disorder: Secondary | ICD-10-CM

## 2018-06-28 DIAGNOSIS — G20A1 Parkinson's disease without dyskinesia, without mention of fluctuations: Secondary | ICD-10-CM

## 2018-06-28 DIAGNOSIS — G8929 Other chronic pain: Secondary | ICD-10-CM

## 2018-06-28 DIAGNOSIS — M5440 Lumbago with sciatica, unspecified side: Principal | ICD-10-CM

## 2018-06-28 DIAGNOSIS — M545 Low back pain, unspecified: Secondary | ICD-10-CM

## 2018-06-28 NOTE — Telephone Encounter (Signed)
-----   Message from [place], DO sent at 06/28/2018  9:42 AM EDT ----- Patrick Ross,   Pt needs referral to [facility] urology for urinary incontinence associated with Parkinsons disease  Hinton Dyer,  Please make f/u appt for patient in 4 months.  Is a DBS 60 min

## 2018-06-28 NOTE — Telephone Encounter (Signed)
LVM to pt--Sent Referral to [facility]--incontinence associated with Parkinson disease

## 2018-06-30 NOTE — Addendum Note (Signed)
Addended by: Geryl Rankins D on: 06/30/2018 10:00 AM   Modules accepted: Orders

## 2018-07-03 ENCOUNTER — Telehealth: Payer: Self-pay | Admitting: Neurology

## 2018-07-03 ENCOUNTER — Telehealth: Payer: Self-pay | Admitting: *Deleted

## 2018-07-03 ENCOUNTER — Other Ambulatory Visit: Payer: Self-pay | Admitting: Neurology

## 2018-07-03 MED ORDER — AMANTADINE HCL ER 129 MG PO TB24: 129.0000 mg | ORAL_TABLET | Freq: Every day | ORAL | 0 refills | Status: DC

## 2018-07-03 NOTE — Telephone Encounter (Signed)
Talk to pt wife--stated that Dr. Daiva Eves having Hallucination to call the office. Patient wife is really worried more about Pt having Emotional outburst and pt  having MRI--June 10 ordered by Dr. Read Drivers... finally pt wife agreed to call Dr. Read Drivers today regarding pt symptoms

## 2018-07-03 NOTE — Telephone Encounter (Signed)
I'm confused.  Is he having hallucinations?  I didn't see that in the initial call - I just saw pain.  Pain needs addressed with Dr. Letta Pate.  I DID ask them to hold his osmolex last visit and they were to call me today.  He didn't think it was working for the extra "wiggly" movements (dyskinesia) so I said to hold that and pay attention to see if his wiggly movement got worse.  What happened with that?

## 2018-07-03 NOTE — Telephone Encounter (Signed)
Patrick Ross has called back and states his pain is extremely intense.  Please advise.

## 2018-07-03 NOTE — Telephone Encounter (Signed)
Patient's wife called to let Dr.Tat know of some new things that are happening. She said that he has been having Emotional outburst and that his pain level is really high. She said that his walking is very unstable and that she is having to remind him to breathe. Also, he has been having Incontinence issues in the AM and PM.  She said he also keeps putting his head to his chest. Please Call. Thanks

## 2018-07-03 NOTE — Telephone Encounter (Signed)
Notified pt wife--Dr. Tat want to re-start pt taking rx osmolex 193mg  which is out of sample but have the Rx osmolex 129 sample available to be pick up front office. Sending Rx Osmolex 193 with the enrollment form today.

## 2018-07-03 NOTE — Telephone Encounter (Signed)
Called pt and talk to his wife--stated having level pain 6-7, lower back pain, no fever, sharp pain for about 2 months. Pt having hard time getting out the bed...please advise

## 2018-07-03 NOTE — Telephone Encounter (Signed)
I spoke with Patrick Ross and Patrick Ross was on speaker phone. They are awaiting to hear if [facility] can do it sooner than June 10th but they will see evaluation if they do not hear something soon.

## 2018-07-03 NOTE — Telephone Encounter (Signed)
He should go to ER if it is very intense.  We are awaiting MRI but this usually takes some time to get approval.

## 2018-07-03 NOTE — Telephone Encounter (Signed)
Did they make an appt with Dr. Letta Pate as we discussed during his video visit regarding his back pain.  They see him for pain, not me.

## 2018-07-06 NOTE — Telephone Encounter (Signed)
The Osmolex got the enrollment form and they are needing to know the refills and the quanity to complete the enrollment please send them that information

## 2018-07-06 NOTE — Telephone Encounter (Signed)
Do you want me to send #90 days supply with R-0--Rx Osmolex 193mg ?

## 2018-07-06 NOTE — Telephone Encounter (Signed)
Whichever he wants

## 2018-07-07 NOTE — Telephone Encounter (Signed)
Rx Osmolex ER 193mg --#90, R-0 sent/faxed Enrollement/Rx form [phone]

## 2018-07-10 ENCOUNTER — Ambulatory Visit: Payer: Medicare Other | Admitting: Neurology

## 2018-07-11 ENCOUNTER — Other Ambulatory Visit: Payer: Self-pay | Admitting: *Deleted

## 2018-07-11 ENCOUNTER — Telehealth: Payer: Self-pay | Admitting: Neurology

## 2018-07-11 DIAGNOSIS — R32 Unspecified urinary incontinence: Secondary | ICD-10-CM

## 2018-07-11 NOTE — Telephone Encounter (Signed)
Patient's wife called regarding a referral to [facility]. She said they have not received a call. Please Call. Thanks

## 2018-07-11 NOTE — Telephone Encounter (Signed)
Informed patient's wife that I sent the referral again.

## 2018-07-17 ENCOUNTER — Encounter (HOSPITAL_COMMUNITY): Payer: Self-pay

## 2018-07-17 ENCOUNTER — Telehealth: Payer: Self-pay | Admitting: Neurology

## 2018-07-17 ENCOUNTER — Encounter: Payer: Self-pay | Admitting: Physical Therapy

## 2018-07-17 ENCOUNTER — Other Ambulatory Visit: Payer: Self-pay

## 2018-07-17 ENCOUNTER — Ambulatory Visit (HOSPITAL_COMMUNITY)
Admission: RE | Admit: 2018-07-17 | Discharge: 2018-07-17 | Disposition: A | Discharge status: Home / Self Care | Payer: Medicare Other | Source: Ambulatory Visit | Attending: Physical Medicine & Rehabilitation | Admitting: Physical Medicine & Rehabilitation

## 2018-07-17 DIAGNOSIS — G8929 Other chronic pain: Secondary | ICD-10-CM

## 2018-07-17 DIAGNOSIS — M545 Low back pain, unspecified: Secondary | ICD-10-CM

## 2018-07-17 NOTE — Telephone Encounter (Signed)
Calling wrong office (we have discussed this many times).  I don't treat his back pain.  I didn't order MRI.  Call Dr. Letta Pate office.

## 2018-07-17 NOTE — Telephone Encounter (Signed)
Called patient wife she was informed to call Dr. Letta Pate office  She is aware and understands

## 2018-07-17 NOTE — Telephone Encounter (Signed)
Patient's wife called regarding Patrick Ross and him having issues with pain and having Incontinent issues as well as balance. He also  Was unable to do MRI today. His wife Patrick Ross said she really needs to speak with someone. Please Call. Thanks

## 2018-07-17 NOTE — Telephone Encounter (Signed)
Called spoke with patient wife Patrick Ross patient has appt with urology on Wednesday Pt c/o back pain, lower back radiating up into spinal area. Muscle spasm. Imaging wasn't able to perform MRI  due to equipment unable scan areas needed.   Please advise  Pt wife states you are aware of balance problems

## 2018-07-18 ENCOUNTER — Telehealth: Payer: Self-pay | Admitting: Neurology

## 2018-07-18 ENCOUNTER — Telehealth: Payer: Self-pay | Admitting: *Deleted

## 2018-07-18 NOTE — Telephone Encounter (Signed)
Most recent telemedicine note and labs faxed to [facility]

## 2018-07-18 NOTE — Telephone Encounter (Signed)
Patient is sch for the urology appt tomorrow and they need the notes and labs faxed over to them today please. They are not in Epic   Fax number is [phone]

## 2018-07-18 NOTE — Telephone Encounter (Signed)
I spoke to the patient and his wife and told them that I have reached out to MRI scheduling and I am awaiting a response as to the why, what, where, how and who its gonna take to get this MRI done.

## 2018-07-18 NOTE — Telephone Encounter (Signed)
Patrick Ross called and said they got a My chart notification that the MRI was canceled.  They went to [facility] yesterday and was told they could not do it.  She is reporting she spoke with Patrick Ross about this.

## 2018-07-19 ENCOUNTER — Other Ambulatory Visit: Payer: Self-pay

## 2018-07-19 MED ORDER — CLONAZEPAM 0.5 MG PO TABS: ORAL_TABLET | ORAL | 1 refills | Status: DC

## 2018-07-19 NOTE — Addendum Note (Signed)
Addended by: Geryl Rankins D on: 07/19/2018 09:30 AM   Modules accepted: Orders

## 2018-07-19 NOTE — Telephone Encounter (Signed)
  Requested Prescriptions   Pending Prescriptions Disp Refills  . clonazePAM (KLONOPIN) 0.5 MG tablet 90 tablet 1    Sig: Take 1 tablet (0.5 mg total) by mouth at bedtime.   Last filled 01/16/18 #90 1 refill Pt last seen: 06/28/18  Please advise

## 2018-07-20 ENCOUNTER — Ambulatory Visit (HOSPITAL_COMMUNITY): Admission: RE | Admit: 2018-07-20 | Payer: Medicare Other | Source: Ambulatory Visit

## 2018-07-20 ENCOUNTER — Telehealth: Payer: Self-pay | Admitting: *Deleted

## 2018-07-20 DIAGNOSIS — M545 Low back pain, unspecified: Secondary | ICD-10-CM

## 2018-07-20 NOTE — Telephone Encounter (Signed)
Patients wife called stating she had been talking to [name] about MRI for pt. She wants the # to [facility] for the MRI.

## 2018-07-20 NOTE — Telephone Encounter (Signed)
Patients wife called back and states that they have had an MRI done at [facility] that gave good results even with the DBS device.  She is hoping we could submit a referral to [facility] for MRI instead of CT.

## 2018-07-20 NOTE — Telephone Encounter (Signed)
That is fine 

## 2018-07-21 NOTE — Telephone Encounter (Signed)
Referral order placed. Submitted to referral coordinator.  Patient needs to have this done at [facility] radiology department.  Patient has a Deep Brain Stimulator (DBS) requiring their specialized equipment

## 2018-07-25 ENCOUNTER — Ambulatory Visit: Payer: Medicare Other | Admitting: Physical Therapy

## 2018-07-25 ENCOUNTER — Ambulatory Visit: Payer: Medicare Other

## 2018-07-25 ENCOUNTER — Ambulatory Visit: Payer: Medicare Other | Admitting: Occupational Therapy

## 2018-07-27 ENCOUNTER — Ambulatory Visit: Payer: Medicare Other | Admitting: Physical Therapy

## 2018-07-27 ENCOUNTER — Ambulatory Visit: Payer: Medicare Other | Admitting: Occupational Therapy

## 2018-08-01 NOTE — Progress Notes (Signed)
Received hard fax from [facility] [phone] [facility] [phone] PAP  UBC PATIENT ID#[id number]  Per telephone conversation with the above listed patient. E has informed me that he is unable to afford the $60/30 days supply copay for the Osmolex. Unfortunately, patient is over our guidelines for our Patient Assistance Program. Therefore, he has asked that we close his case.  Should new information become available or if a decision is made to purse assistance with Osmolex ER product, please submit a new patient enrollment form for consideration.  Please feel free to contact access osmolex support center with any question

## 2018-08-09 ENCOUNTER — Other Ambulatory Visit: Payer: Medicare Other

## 2018-08-14 ENCOUNTER — Ambulatory Visit: Payer: Medicare Other | Admitting: Physical Medicine & Rehabilitation

## 2018-08-14 ENCOUNTER — Encounter: Payer: Medicare Other | Attending: Physical Medicine & Rehabilitation | Admitting: Physical Medicine & Rehabilitation

## 2018-08-14 ENCOUNTER — Encounter: Payer: Self-pay | Admitting: Physical Medicine & Rehabilitation

## 2018-08-14 ENCOUNTER — Other Ambulatory Visit: Payer: Self-pay

## 2018-08-14 VITALS — BP 126/97 | HR 90 | Temp 97.9°F | Ht 72.0 in | Wt 211.8 lb

## 2018-08-14 DIAGNOSIS — G248 Other dystonia: Secondary | ICD-10-CM | POA: Insufficient documentation

## 2018-08-14 DIAGNOSIS — Z79899 Other long term (current) drug therapy: Secondary | ICD-10-CM | POA: Diagnosis not present

## 2018-08-14 DIAGNOSIS — M48061 Spinal stenosis, lumbar region without neurogenic claudication: Secondary | ICD-10-CM | POA: Diagnosis not present

## 2018-08-14 DIAGNOSIS — K219 Gastro-esophageal reflux disease without esophagitis: Secondary | ICD-10-CM | POA: Insufficient documentation

## 2018-08-14 DIAGNOSIS — E039 Hypothyroidism, unspecified: Secondary | ICD-10-CM | POA: Diagnosis not present

## 2018-08-14 DIAGNOSIS — I10 Essential (primary) hypertension: Secondary | ICD-10-CM | POA: Insufficient documentation

## 2018-08-14 DIAGNOSIS — G4733 Obstructive sleep apnea (adult) (pediatric): Secondary | ICD-10-CM | POA: Diagnosis not present

## 2018-08-14 DIAGNOSIS — G20 Parkinson's disease: Secondary | ICD-10-CM | POA: Insufficient documentation

## 2018-08-14 DIAGNOSIS — Z79890 Hormone replacement therapy (postmenopausal): Secondary | ICD-10-CM | POA: Insufficient documentation

## 2018-08-14 NOTE — Patient Instructions (Signed)

## 2018-08-14 NOTE — Progress Notes (Signed)
Botox Injection for spasticity using needle EMG guidance  Dilution: 50 Units/ml Indication: Severe spasticity which interferes with ADL,mobility and/or  hygiene and is unresponsive to medication management and other conservative care Informed consent was obtained after describing risks and benefits of the procedure with the patient. This includes bleeding, bruising, infection, excessive weakness, or medication side effects. A REMS form is on file and signed. Needle: 25g 2" needle electrode Number of units per muscle Right flexor hall longus 50 units Right flexor digitorum longus 50 units Right flexor hallucis brevis 25 units Right flexor digitorum brevis 25 units RIght tibialis posterior 50 units All injections were done after obtaining appropriate EMG activity and after negative drawback for blood. The patient tolerated the procedure well. Post procedure instructions were given. A followup appointment was made.

## 2018-09-22 NOTE — Telephone Encounter (Signed)
Message from patient

## 2018-09-25 ENCOUNTER — Encounter: Payer: Medicare Other | Admitting: Physical Medicine & Rehabilitation

## 2018-10-13 ENCOUNTER — Other Ambulatory Visit: Payer: Self-pay | Admitting: Neurology

## 2018-10-13 NOTE — Telephone Encounter (Signed)
Requested Prescriptions   Pending Prescriptions Disp Refills  . carbidopa-levodopa (SINEMET IR) 25-100 MG tablet [Pharmacy Med Name: CARBIDOPA/LEVODOPA TABS 25/100MG ] 360 tablet 3    Sig: TAKE 1 TABLET FOUR TIMES A DAY (7 AM, 11 AM, 3 PM, 7 PM)   Rx last filled:05/22/18 #360  1 refill  Pt last seen:06/28/18   Follow up appt scheduled:10/23/18

## 2018-10-23 ENCOUNTER — Other Ambulatory Visit: Payer: Self-pay

## 2018-10-23 ENCOUNTER — Encounter: Payer: Self-pay | Admitting: Neurology

## 2018-10-23 ENCOUNTER — Ambulatory Visit (INDEPENDENT_AMBULATORY_CARE_PROVIDER_SITE_OTHER): Payer: Medicare Other | Admitting: Neurology

## 2018-10-23 VITALS — BP 163/107 | HR 82 | Ht 75.0 in | Wt 209.0 lb

## 2018-10-23 DIAGNOSIS — G4752 REM sleep behavior disorder: Secondary | ICD-10-CM

## 2018-10-23 DIAGNOSIS — G249 Dystonia, unspecified: Secondary | ICD-10-CM

## 2018-10-23 DIAGNOSIS — R413 Other amnesia: Secondary | ICD-10-CM | POA: Diagnosis not present

## 2018-10-23 DIAGNOSIS — G20 Parkinson's disease: Secondary | ICD-10-CM | POA: Diagnosis not present

## 2018-10-23 NOTE — Procedures (Signed)
DBS Programming was performed.    Total time spent programming was 5 minutes.  Device was confirmed to be on.  Soft start was confirmed to be on.  Impedences were checked and were within normal limits.  Battery was checked and was 2.91 on the left.   Battery on the right was normal at 2.73.  Final settings were as follows:  Left brain electrode:     2-1+           ; Amplitude  4.7   V   ; Pulse width 90 microseconds;   Frequency   135   Hz.  Right brain electrode:     3-C+          ; Amplitude   3.2  V ;  Pulse width 90  microseconds;  Frequency   135    Hz.

## 2018-10-23 NOTE — Patient Instructions (Signed)
-   a referral has been place with Dr. Melvyn Novas

## 2018-10-23 NOTE — Progress Notes (Signed)
Patrick Ross was seen today in follow up for Parkinsons disease.  Pt is currently on Stalevo, 200 mg 4 times per day, along with an additional carbidopa/levodopa 25/100, 1 tablet 3 times per day.  He is also on carbidopa/levodopa 50/200 at bedtime.  Last visit, I had him hold his Osmolex and then restarted it at a lower dose as the higher dose caused dizziness.  The lower dose did not seem to cause that problem.  I sent in a prescription for Osmolex, 193 mg.  They ended up calling us back in stating that they were unable to afford the $60 co-pay for this medication.   He is back on amantadine 100 mg tid.   He has been working with Dr. Letta Pate regarding his back pain.  He/his wife sent many messages here regarding his back, but we did redirected him to Dr. Dianna Limbo office.  He is therefore not on this medication.  He is has been receiving Botox for dystonia from Dr. Letta Pate in his legs, last of which was on August 14, 2018.  He also had a consultation at neurosurgery at [facility] on August 18.  Those notes are not completed, but the patient states that a spinal cord stimulator was recommended for his back pain.  I did review the notes from neurosurgery from [facility] from July 23 with the nurse practitioner.   Wife does state that she is giving him doterra oils and it is helping the pain.  [facility].  He states that he did go.  I did not get a copy of notes.  Patient states that he didn't have a good experience.  He ended up going to Dr. Felipa Eth in [facility].  He didn't get medication but states that he is doing better in that regard..  pt denies falls.  In regards to his REM behavior disorder, he is on clonazepam.  That was increased to 1-1/2 tablets at night last visit.  He reports that it helps mostly but sometimes he will have an acting out episode.  He has had falls but usually without the walker (like in a cluttered place).  With one  fall, he was fishing and he just rolled on the ground and didn't get hurt.     PREVIOUS MEDICATIONS: amantadine; klonopin; carbidopa/levodopa 25/100; carbidopa/levodopa 50/200; stalevo; Osmolex -had dizziness with higher dosages and unable to afford the $60 per month co-pay for the lower dose.  ALLERGIES:   Allergies  Allergen Reactions   Other Other (See Comments)    Surgical spray prior to adhesive causes blisters   Sulfamethoxazole Other (See Comments)    Unsure--childhood allergy    CURRENT MEDICATIONS:  Outpatient Encounter Medications as of 10/23/2018  Medication Sig   acetaminophen (TYLENOL) 325 MG tablet Take 325 mg by mouth every 6 (six) hours as needed (pain).    amantadine (SYMMETREL) 100 MG capsule TAKE 1 CAPSULE THREE TIMES A DAY AT 7 A.M., 1 P.M., AND 7 P.M.   Ascorbic Acid (VITAMIN C) 100 MG tablet Take 100 mg by mouth daily.   carbidopa-levodopa (SINEMET CR) 50-200 MG tablet Take 1 tablet by mouth at bedtime.   carbidopa-levodopa (SINEMET IR) 25-100 MG tablet TAKE 1 TABLET FOUR TIMES A DAY (7 AM, 11 AM, 3 PM, 7 PM)   carbidopa-levodopa-entacapone (STALEVO) 50-200-200 MG tablet Take 1 tablet by mouth 4 (four) times daily. 7am/11am/3pm/7pm   Cholecalciferol (VITAMIN D3) 2000 units capsule  Take 2,000 Units by mouth daily.    clonazePAM (KLONOPIN) 0.5 MG tablet 1.5 tablets at bedtime   gabapentin (NEURONTIN) 300 MG capsule Take 1 capsule by mouth 2 (two) times daily.    Levothyroxine Sodium 137 MCG CAPS Take 137 mcg by mouth daily at 2 PM.   lisinopril (PRINIVIL,ZESTRIL) 20 MG tablet Take 1 tablet by mouth daily.   Magnesium Oxide 400 MG CAPS Take 1 capsule by mouth daily.    Multiple Vitamin (MULTIVITAMIN) capsule Take 1 capsule by mouth daily.    Omega-3 Fatty Acids (FISH OIL) 1000 MG CAPS Take by mouth.   Testosterone 10 MG/ACT (2%) GEL Place 6 Pump onto the skin daily. 3 PUMPS TO EACH THIGH   TURMERIC PO Take 1 tablet by mouth daily.    Vitamin  Mixture (VITAMIN E COMPLETE) CAPS Take 1 capsule by mouth daily.    omeprazole (PRILOSEC) 20 MG capsule    [DISCONTINUED] Amantadine HCl ER (OSMOLEX ER) 129 MG TB24 Take 129 mg by mouth daily. (Patient not taking: Reported on 10/23/2018)   [DISCONTINUED] Amantadine HCl ER (OSMOLEX ER) 129 MG TB24 Take 129 mg by mouth daily. (Patient not taking: Reported on 10/23/2018)   [DISCONTINUED] Amantadine HCl ER (OSMOLEX ER) 193 MG TB24 Take 193 mg by mouth daily. (Patient not taking: Reported on 10/23/2018)   [DISCONTINUED] Amantadine HCl ER (OSMOLEX ER) 193 MG TB24 Take 1 tablet by mouth daily. (Patient not taking: Reported on 10/23/2018)   No facility-administered encounter medications on file as of 10/23/2018.     PAST MEDICAL HISTORY:   Past Medical History:  Diagnosis Date   GERD (gastroesophageal reflux disease)    History of kidney stones    Hypertension    Hypothyroidism    Lumbar stenosis    OSA (obstructive sleep apnea)    wears cpap   Parkinson's disease ([place])    PONV (postoperative nausea and vomiting)    REM behavioral disorder    Urinary incontinence     PAST SURGICAL HISTORY:   Past Surgical History:  Procedure Laterality Date   BACK SURGERY     DEEP BRAIN STIMULATOR PLACEMENT     left 12/27/2012, right 12/10/2011, left revision 02/02/2013, replacement bilateral 11/21/2015   KNEE SURGERY     NEPHRECTOMY     SPINAL FUSION     SUBTHALAMIC STIMULATOR BATTERY REPLACEMENT Left 02/02/2018   Procedure: Change implantable pulse generator battery, Left chest;  Surgeon: Erline Levine, MD;  Location: [place];  Service: Neurosurgery;  Laterality: Left;   TONSILLECTOMY      SOCIAL HISTORY:   Social History   Socioeconomic History   Marital status: Married    Spouse name: Not on file   Number of children: 3   Years of education: Not on file   Highest education level: Master's degree (e.g., MA, MS, MEng, MEd, MSW, MBA)  Occupational History   Not on file    Social Needs   Financial resource strain: Not on file   Food insecurity    Worry: Not on file    Inability: Not on file   Transportation needs    Medical: Not on file    Non-medical: Not on file  Tobacco Use   Smoking status: Never Smoker   Smokeless tobacco: Never Used  Substance and Sexual Activity   Alcohol use: Not Currently   Drug use: Never   Sexual activity: Not on file  Lifestyle   Physical activity    Days per week: Not on file  Minutes per session: Not on file   Stress: Not on file  Relationships   Social connections    Talks on phone: Not on file    Gets together: Not on file    Attends religious service: Not on file    Active member of club or organization: Not on file    Attends meetings of clubs or organizations: Not on file    Relationship status: Not on file   Intimate partner violence    Fear of current or ex partner: Not on file    Emotionally abused: Not on file    Physically abused: Not on file    Forced sexual activity: Not on file  Other Topics Concern   Not on file  Social History Narrative   Not on file    FAMILY HISTORY:   Family Status  Relation Name Status   Mother  Alive   Father  Deceased   Brother  Alive   Child x3 Alive    ROS:  Review of Systems  Constitutional: Positive for malaise/fatigue.  HENT: Negative.   Eyes: Negative.   Respiratory: Negative.   Cardiovascular: Negative.   Gastrointestinal: Negative.   Musculoskeletal: Positive for back pain and falls.  Skin: Negative.     PHYSICAL EXAMINATION:    VITALS:   Vitals:   10/23/18 1249  BP: (!) 163/107  Pulse: 82  SpO2: 97%  Weight: 209 lb (94.8 kg)  Height: 6\' 3"  (1.905 m)    GEN:  The patient appears stated age and is in NAD. HEENT:  Normocephalic, atraumatic.  The mucous membranes are moist. The superficial temporal arteries are without ropiness or tenderness. CV:  RRR Lungs:  CTAB Neck/HEME:  There are no carotid bruits  bilaterally.  Neurological examination:  Orientation: The patient is alert and oriented x3. Cranial nerves: There is good facial symmetry with facial hypomimia. The speech is fluent and dysarthric. Soft palate rises symmetrically and there is no tongue deviation. Hearing is intact to conversational tone. Sensation: Sensation is intact to light touch throughout Motor: Strength is at least antigravity x4.  Movement examination: Tone: There is normal tone in the upper and lower extremities. Abnormal movements: He has RUE dyskinesia Coordination:  There is  decremation with RAM's, with finger taps on the left and toe taps on the left. Gait and Station: The patient pushes off of the chair to arise.  He is given his U step walker to ambulate.  He is dragging the L leg.     ASSESSMENT/PLAN:  1. Idiopathic Parkinson's disease, diagnosed in the year 2000 -The patient underwent right STN DBS on December 10, 2011 and a left STN DBS on December 27, 2012. Postoperatively, his left STN DBS lead was found to be displaced and had revision of the left lead on February 02, 2013 (pulled back 1 cm without MER).His IPG was last changed on February 02, 2018. -Postop images done in 2019 indicate that the left lead still appears to be inferior in location compared to that of the right lead, despite the fact that this has been revised on one occasion. Patient very much wants this lead revised.  He asks me about logistics of this.  He asks me about doing this soon, versus waiting for a [employer] directional lead.  I told him I thought it would be close to a year before that lead would be out, but could be wrong about that and will check with the [employer] reps.   He is frustrated  very much that he has not been able to reduce his medication load and is having right-sided dyskinesia, speech difficulties, and difficulty with more bradykinesia on the right side. He and I discussed extensively the  risks, benefits, and side effects of going in a third time for surgery.   -In anticipation of possible revision surgery, the patient would like to go ahead and schedule neurocognitive testing with Dr. Melvyn Novas.  We will schedule him for early next year. -He had side effects (dizziness) when he used osmolex, 129mg +193 mg to make 322 mg, but it was efficacious.   We did try to put him on 193 mg alone, but ultimately he was unable to afford the $60 per month co-pay.  He is back on amantadine, 100 mg 3 times per day. -Patient is receiving Botox from Dr. Letta Pate for feet dystonia and that is helping  2.REM behavior disorder -Better, although not completely controlled, with clonazepam 0.5 mg, 1.5 tablets nightly.  3.  urinary incontinence             -Now following with Dr. Felipa Eth in [facility].    4.  Low back pain             -Following with Dr. Letta Pate and [facility] neurosurgery.  States that spinal cord stimulator has been recommended.  He asks me about that.  I told them to ask whoever he is going to see for that about whether this is MRI compatible (most or not).  If he decides to proceed with that, I would like to go ahead and get new MRI brain images before he has the spinal cord stimulator, in anticipation of DBS revision.  5.  Follow up is anticipated in the next 4-6 months, sooner should new neurologic issues arise.  Much greater than 50% of this visit was spent in counseling and coordinating care.  Total face to face time:  40 min, not including DBS time.  Cc:  Jefm Petty, MD

## 2018-11-17 ENCOUNTER — Ambulatory Visit: Payer: Medicare Other | Admitting: Psychology

## 2018-11-17 ENCOUNTER — Ambulatory Visit (INDEPENDENT_AMBULATORY_CARE_PROVIDER_SITE_OTHER): Payer: Medicare Other | Admitting: Psychology

## 2018-11-17 ENCOUNTER — Other Ambulatory Visit: Payer: Self-pay

## 2018-11-17 ENCOUNTER — Encounter: Payer: Self-pay | Admitting: Psychology

## 2018-11-17 DIAGNOSIS — G20 Parkinson's disease: Secondary | ICD-10-CM

## 2018-11-17 DIAGNOSIS — R413 Other amnesia: Secondary | ICD-10-CM

## 2018-11-17 NOTE — Progress Notes (Signed)
   Neuropsychology Note   Patrick Ross completed 120 minutes of neuropsychological testing with technician, Milana Kidney, B.S., under the supervision of Dr. Christia Reading, Ph.D., licensed neuropsychologist. The patient did not appear overtly distressed by the testing session, per behavioral observation or via self-report to the technician. Rest breaks were offered.    In considering the patient's current level of functioning, level of presumed impairment, nature of symptoms, emotional and behavioral responses during the interview, level of literacy, and observed level of motivation/effort, a battery of tests was selected and communicated to the psychometrician.   Communication between the psychologist and technician was ongoing throughout the testing session and changes were made as deemed necessary based on patient performance on testing, technician observations and additional pertinent factors such as those listed above.   Patrick Ross will return within approximately two weeks for an interactive feedback session with Dr. Melvyn Novas at which time his test performances, clinical impressions, and treatment recommendations will be reviewed in detail. The patient understands he can contact our office should he require our assistance before this time.   Full report to follow.  120 minutes were spent face-to-face with patient administering standardized tests and 15 minutes were spent scoring (technician). [CPT [id number], [id number]

## 2018-11-17 NOTE — Progress Notes (Signed)
NEUROPSYCHOLOGICAL EVALUATION [place] [place] Department of Neurology  Reason for Referral:   Patrick Ross is a 73 y.o. Caucasian male referred by Alonza Bogus, D.O., to characterize his current cognitive functioning and assist with diagnostic clarity and treatment planning in the context of a history of Parkinson's disease and DBS candidacy.  Assessment and Plan:   Clinical Impression(s): Overall, Mr. Stile pattern of performance is suggestive of mild subcortical/frontal subcortical dysfunction as evidenced by weaknesses across processing speed and certain aspects of executive functioning (i.e., cognitive flexibility and working memory). This pattern of cognitive performance is common in individuals diagnosed with Parkinson's disease, which represents the likely etiology of these deficits. Performance across domains of basic attention, other aspects of executive functioning (e.g., abstract reasoning, problem solving), receptive and expressive language, visuospatial abilities, and learning and memory were within normal limits.   Relative to his previous evaluations, scores did appear somewhat suppressed across several cognitive domains, including processing speed, working memory, cognitive flexibility, semantic fluency, and verbal and visual learning and memory. However, outside of changes across the former 3 domains, exhibited declines were generally mild and current scores remained within appropriate normative ranges given premorbid intellectual estimates.   Additional factors which can create and maintain cognitive inefficiencies include ongoing sleep disturbances, persistent back pain, and various sources of psychosocial stress. It is possible that a combination of these factors negatively influence day-to-day cognitive difficulties which [name] and his wife noted being present.  Important Considerations for DBS Candidacy:  1. Is the patient experiencing  cognitive symptoms that far exceed what is expected for their situation? No, cognitive deficits are overall mild in nature and the pattern of dysfunction is consistent with what is typically seen in Parkinson's disease. 2. Is there a separate neurological process at work? No, it is not believed so. Declines in certain cognitive domains were present relative to previous evaluations. However, his current cognitive and behavioral profile does not fit other known neurological processes, including Alzheimer's disease (memory scores within normal limits with appropriate retention), Lewy body dementia (positive benefit from levodopa medications, no report of fully-formed hallucinations or fluctuations in alertness), or frontotemporal dementia (no behavioral disturbances or speech fluency concerns). Additionally, prior neuroimaging did not suggest significant ischemic changes suggestive of a notable vascular component.  3. Were any psychological stressors identified within the individual and/or family beyond PD that may impact post-operative adjustment? No. Responses across mood-related questionnaires suggested minimal symptoms of both anxiety and depression.  4. Can this person cope with the stress of surgery and be compliant as an awake participant in surgery? Yes, it is believed so. 5. Can this person participate in the multiple post-operative programming sessions and medication adjustments? Yes, it is believed so. 6. From a neuropsychological perspective, does this person appear to be a good candidate for DBS? Yes, I believe that [name] continues to be an appropriate candidate for DBS.  Recommendations: [name] is encouraged to discuss these results with Dr. Carles Collet and continue with pre-operative testing and imaging studies should he remain interested in pursuing DBS for a third time.  Given that there was some decline from his previous neuropsychological evaluation in 2015, a follow-up re-evaluation would be  prudent to assess any further decline, should it occur. A re-evaluation in 24 months following his DBS procedure (or sooner if functional decline is noted) would be appropriate.  To address problems with processing speed, he may wish to consider:   -Scheduling more difficult activities for a  time of day when he is usually most alert   -Ensuring that he is alerted when essential material or instructions are being presented   -Adjusting the speed at which new information is presented   -Allowing additional processing time or a chance to rehearse novel information   -Allowing for more time in comprehending, processing, and responding in conversation  Review of Records:   Mr. Fetchko completed a comprehensive neuropsychological evaluation [name], Ph.D.) on 11/11/2011 at [facility] to assist in determining his DBS candidacy. Overall, Dr. Gaspar Skeeters described [name] as having a "very strong cognitive profile" with subtle, relative weaknesses in verbal fluency. Overall, concerns regarding him undergoing DBS surgical procedures were not reported. [name] was re-evaluated by Dr. Gaspar Skeeters on 12/26/2012, with results suggesting "generally high average scores across the board" with scores unchanged following right STN DBS placement. Finally, he was evaluated for a third time by Dr. Gaspar Skeeters on 02/12/2014. Results suggested a "WNL/strong cognitive status" profile, with some variability across all evaluations. Verbal fluency and metal flexibility were said to exhibit a mild decline relative to the previous evaluation.   [name] was seen by [facility] Wells Guiles Tat, D.O.) on 10/23/2018 for follow-up of Parkinson's disease. He was diagnosed with idiopathic Parkinson's disease in 2000. He underwent right STN DBS on 12/10/2011 and a left STN DBS on 12/27/2012. Postoperatively, his left STN DBS lead was found to be displaced and he underwent a revision of the left lead on 02/02/2013.  Postoperative images done in 2019 suggested that the left lead still appears to be inferior in location compared to that of the right lead, despite the fact that this has been revised on one occasion. Mr. Mesias expressed a desire to have this lead revised once again given the inability to reduce his current medications and ongoing right-sided dyskinesia, speech difficulties, right-sided bradykinesia. He was subsequently referred for a comprehensive neuropsychological evaluation to characterize his cognitive abilities and assist with determining DBS candidacy.  Brain MRI on 09/19/2017 suggested that the left-side DBS lead extends more caudal than that on the right. Acute intracranial abnormalities were denied.  Past Medical History:  Diagnosis Date   GERD (gastroesophageal reflux disease)    History of kidney stones    Hypertension    Hypothyroidism    Lumbar stenosis    OSA (obstructive sleep apnea)    wears cpap   Parkinson's disease ([place])    PONV (postoperative nausea and vomiting)    REM behavioral disorder    Urinary incontinence     Past Surgical History:  Procedure Laterality Date   BACK SURGERY     DEEP BRAIN STIMULATOR PLACEMENT     left 12/27/2012, right 12/10/2011, left revision 02/02/2013, replacement bilateral 11/21/2015   KNEE SURGERY     NEPHRECTOMY     SPINAL FUSION     SUBTHALAMIC STIMULATOR BATTERY REPLACEMENT Left 02/02/2018   Procedure: Change implantable pulse generator battery, Left chest;  Surgeon: Erline Levine, MD;  Location: [place];  Service: Neurosurgery;  Laterality: Left;   TONSILLECTOMY      Family History  Problem Relation Age of Onset   AAA (abdominal aortic aneurysm) Mother        5 yo   Heart disease Father    Diabetes Father    Heart disease Brother    Diabetes Brother      Current Outpatient Medications:    acetaminophen (TYLENOL) 325 MG tablet, Take 325 mg by mouth every 6 (six) hours as  needed (pain). , Disp: , Rfl:     amantadine (SYMMETREL) 100 MG capsule, TAKE 1 CAPSULE THREE TIMES A DAY AT 7 A.M., 1 P.M., AND 7 P.M., Disp: 21 capsule, Rfl: 0   Ascorbic Acid (VITAMIN C) 100 MG tablet, Take 100 mg by mouth daily., Disp: , Rfl:    carbidopa-levodopa (SINEMET CR) 50-200 MG tablet, Take 1 tablet by mouth at bedtime., Disp: , Rfl:    carbidopa-levodopa (SINEMET IR) 25-100 MG tablet, TAKE 1 TABLET FOUR TIMES A DAY (7 AM, 11 AM, 3 PM, 7 PM), Disp: 84 tablet, Rfl: 0   carbidopa-levodopa-entacapone (STALEVO) 50-200-200 MG tablet, Take 1 tablet by mouth 4 (four) times daily. 7am/11am/3pm/7pm, Disp: , Rfl:    Cholecalciferol (VITAMIN D3) 2000 units capsule, Take 2,000 Units by mouth daily. , Disp: , Rfl:    clonazePAM (KLONOPIN) 0.5 MG tablet, 1.5 tablets at bedtime, Disp: 135 tablet, Rfl: 1   gabapentin (NEURONTIN) 300 MG capsule, Take 1 capsule by mouth 2 (two) times daily. , Disp: , Rfl:    Levothyroxine Sodium 137 MCG CAPS, Take 137 mcg by mouth daily at 2 PM., Disp: , Rfl:    lisinopril (PRINIVIL,ZESTRIL) 20 MG tablet, Take 1 tablet by mouth daily., Disp: , Rfl:    Magnesium Oxide 400 MG CAPS, Take 1 capsule by mouth daily. , Disp: , Rfl:    Multiple Vitamin (MULTIVITAMIN) capsule, Take 1 capsule by mouth daily. , Disp: , Rfl:    Omega-3 Fatty Acids (FISH OIL) 1000 MG CAPS, Take by mouth., Disp: , Rfl:    omeprazole (PRILOSEC) 20 MG capsule, , Disp: , Rfl:    Testosterone 10 MG/ACT (2%) GEL, Place 6 Pump onto the skin daily. 3 PUMPS TO EACH THIGH, Disp: , Rfl:    TURMERIC PO, Take 1 tablet by mouth daily. , Disp: , Rfl:    Vitamin Mixture (VITAMIN E COMPLETE) CAPS, Take 1 capsule by mouth daily. , Disp: , Rfl:   Clinical Interview:   Cognitive Symptoms: Decreased short-term memory: Largely denied. Mr. Starr described "little things here and there" occurring, referring to occasional memory lapses. However, overall, he and his wife denied concerns regarding memory. Decreased long-term memory:  Denied. Decreased attention/concentration: Denied. Increased ease of distractibility: Denied. Reduced processing speed: Endorsed. Mental processing was described as "slowed down." Difficulties with executive functions: Denied. Symptoms surrounding impulsivity were said to have improved over the years and subsequent treatment.  Difficulties with emotion regulation: Denied. Difficulties with receptive language: Denied as long as Mr. Isgett can hear the individual appropriately.  Difficulties with word finding: Endorsed. Decreased visuoperceptual ability: Endorsed. Specifically, deficits surrounded depth perception and understanding his location and the location of other objects in his environment.   Trajectory of deficits: Cognitive difficulties were largely described as stable and unchanged over the past few years. Exceptions to this include visuospatial functioning (i.e., depth perception), which has worsened over the past 10 years, along with word finding to a mild extent.   Difficulties completing ADLs: Denied. Mr. Mitri does not currently drive. However, this is due to back pain and Parkinson's-related symptoms of dyskinesia rather than cognitive concerns.   Additional Medical History: History of traumatic brain injury/concussion: Endorsed. Mr. Mcpheters reported being hit in the head with a chunk of iron in the 1980s while working. He denied experiencing a loss in consciousness, but did experience symptoms of disorientation following his impact. He was not hospitalized and returned work shortly afterwards. Persisting post-concussion symptoms were denied.  History of stroke: Denied.  History of seizure activity: Denied. History of known exposure to toxins: Denied. Symptoms of chronic pain: Endorsed. Symptoms of back pain were notable. Ongoing treatment in the form of over-the-counter medications and several essential oils were described as helpful.  Experience of frequent headaches/migraines:  Denied. Frequent instances of dizziness/vertigo: Endorsed. Symptoms of dizziness were said to occur "sometimes" but were said to not be contributory to his history of falls (see below).  Sensory changes: Endorsed. He reported ongoing visual acuity concerns, which appear partially improved via corrective lenses. He also reported diminished hearing, attributed to a history of working around loud machinery. Loss of smell was described as longstanding, predating his diagnosis of Parkinson's disease. Sense of taste was described as unchanged.  Balance/coordination difficulties: Endorsed. Ongoing balance concerns were attributed to symptoms of dyskinesia, predominantly affecting the right side of his body. He described a history of several falls, generally due to tripping over things in his environment. Other motor difficulties: Denied outside what is described above. Parkinson's-related medications were said to improve balance/motor symptoms overall.  Sleep History: Estimated hours obtained each night: 8 or more. Difficulties falling asleep: Denied. Difficulties staying asleep: Largely denied. He reported waking up to use the restroom throughout the night. These difficulties have shown a significant improvement since being prescribed a CPAP machine. Feels rested and refreshed upon awakening: Endorsed.  History of snoring: Endorsed. History of waking up gasping for air: Endorsed. Witnessed breath cessation while asleep: Endorsed. Mr. Somoza was previously diagnosed with obstructive sleep apnea and utilizes a CPAP machine nightly with positive effect.  History of vivid dreaming: Endorsed. Excessive movement while asleep: Endorsed. Instances of acting out his dreams: Endorsed. Mr. Kazmierski has a history of a REM sleep disorder and instances of vivid dreaming and acting out his dreams. However, he and his wife noted that these symptoms have become far better controlled via oral medications and CPAP machine  use.  Psychiatric/Behavioral Health History: Depression: Denied. Mr. Kurman described his mood as "fine" and denied previously being diagnosed with any mental health concerns. Current or remote suicidal ideation, intent, or plan were also denied. Anxiety: Denied. Mania: Denied. Trauma History: Denied. Visual/auditory hallucinations: Endorsed. Hallucinations (e.g., snakes hanging out of the ceiling) were said to surround periods either immediately before falling asleep or after awakening. These were said to be notably improved since starting CPAP treatment. Delusional thoughts: Denied. Mental health treatment: Endorsed. Mr. Stahlberg previously worked with an individual therapist to address traits of obsessive-compulsive disorder. He did not endorse ever being formally diagnosed with this condition; however, compulsive behaviors did lead to psychosocial difficulties in the remote past. Currently, symptoms were described as managed well and not present.   Tobacco: Denied. Alcohol: Mr. Posso denied current alcohol consumption, as well as a history of problematic alcohol use, abuse, or dependence.  Recreational drugs: Denied. Caffeine: Largely denied. He reported consuming a caffeinated soda approximately once per week.  Academic/Vocational History: Highest level of educational attainment: 18 years. Mr. 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 He described himself as a good (A/B) student in academic settings.  History of developmental delay: Denied. History of grade repetition: Endorsed. He reported repeating the first grade due to him being placed in school too early.  History of class failures: Denied. Enrollment in special education courses: Denied. Longstanding strengths/weaknesses: Reading was described as a relative weakness. However, after repeating the first grade, his reading levels were said to be on par with  his classmates. History of diagnosed specific learning disability: Denied. History of ADHD: Denied.  Employment: Retired. Mr. Gersh previously served in the [employer] for 20 years, while also working in the [employer] and as a Theme park manager for 6-10 years.   Evaluation Results:   Behavioral Observations: Mr. Loree was accompanied by his wife, arrived to his appointment on time, and was appropriately dressed and groomed. He ambulated with the assistance of a rolling walker and did not exhibit difficulties operating this device. Gross motor functioning appeared intact upon informal observation. While tremulous behavior was not observed, some symptoms of dyskinesia were present during the clinical interview. His affect was generally relaxed and positive, but did range appropriately given the subject being discussed during the clinical interview or the task at hand during testing procedures. Spontaneous speech was fluent; however, he sometimes spoke quickly causing words to become jumbled or slurred and difficult to understand. Word finding difficulties were not observed. Sustained attention was appropriate throughout. Thought processes were coherent, organized, and normal in content. Task engagement was adequate and he persisted well when challenged. Overall, Mr. Stalls was cooperative with the clinical interview and subsequent testing procedures.   Adequacy of Effort: The validity of neuropsychological testing is limited by the extent to which the individual being tested may be assumed to have exerted adequate effort during testing. Mr. Mcfeeley expressed his intention to perform to the best of his abilities and exhibited adequate task engagement and persistence. Scores across stand-alone and embedded performance validity measures were within expectation. As such, the results of the current evaluation are believed to be a valid representation of Mr. Leese current cognitive functioning.  Test Results: Mr.  Ninneman was fully oriented at the time of the current evaluation.  Intellectual abilities based upon educational and vocational attainment were estimated to be in the average range. Premorbid abilities were estimated to be within the average range based upon a single-word reading test.   Processing speed was variable, ranging from the exceptionally low to above average normative ranges and represented a relative weakness across the current evaluation. Basic attention was average. More complex attention (e.g., working memory) was below average. Performance across tasks assessing executive functions (e.g., cognitive flexibility, response inhibition, nonverbal abstract reasoning, analytical problem solving) were mildly variable, but largely within normal limits. A relative weakness was exhibited across cognitive flexibility.  While not directly assessed, receptive language abilities are believed to be intact as Mr. Ciaramella did not exhibit any difficulties comprehending task instructions and answered all questions asked of him appropriately. Assessed expressive language (e.g., verbal fluency and confrontation naming) was generally within normal limits. A relative weakness was noted across semantic fluency.     Assessed visuospatial/visuoconstructional abilities were within normal limits. A relative weakness was exhibited across a task assessing visual discrimination.    Learning (i.e., encoding) of novel verbal and visual information was within normal limits. Spontaneous delayed recall (i.e., retrieval) of previously learned information was commensurate with performance across initial learning trials. Retention rates were appropriate across memory measures. Performance across recognition tasks was likewise appropriate, suggesting evidence for appropriate information consolidation. A relative weakness across a list learning recognition task was exhibited.   Results of emotional screening instruments suggested that  recent symptoms of generalized anxiety were in the minimal range, while symptoms of depression were within normal limits. A screening instrument assessing recent sleep quality suggested the presence of minimal sleep dysfunction.  Tables of Scores:   Note: This summary of test scores accompanies the  interpretive report and should not be considered in isolation without reference to the appropriate sections in the text. Descriptors are based on appropriate normative data and may be adjusted based on clinical judgment. The terms impaired and within normal limits (WNL) are used when a more specific level of functioning cannot be determined.       Effort Testing:   DESCRIPTOR       ACS Word Choice: --- --- Within Expectation  Dot Counting Test: --- --- Within Expectation  CVLT-III Brief Form Forced Choice Recognition: --- --- Within Expectation  BVMT-R Retention Percentage: --- --- Within Expectation       Orientation:      Raw Score Percentile   NAB Orientation, Form 1 29/29 71 Average       Intellectual Functioning:           Standard Score Percentile   Test of Premorbid Functioning (TOPF): 102 55 Average       Memory:          Wechsler Memory Scale (WMS-IV):                       Raw Score (Scaled Score) Percentile     Logical Memory I 35/53 (11) 63 Average    Logical Memory II 25/39 (13) 84 Above Average    Logical Memory Recognition 21/23 >75 Above Average       [place] Verbal Learning Test (CVLT-III) Brief Form: Raw Score (Scaled/Standard Score) Percentile     Total Trials 1-4 25/36 (95) 37 Average    Short-Delay Free Recall 7/9 (10) 50 Average    Long-Delay Free Recall 7/9 (11) 63 Average    Long-Delay Cued Recall 7/9 (11) 63 Average      Recognition Hits 7/9 (7) 16 Below Average      False Positive Errors 3 (5) 5 Well Below Average       Brief Visuospatial Memory Test (BVMT-R),  Form 1: Raw Score (T Score) Percentile     Total Trials 1-3 21/36 (50) 50 Average     Delayed Recall 8/12 (50) 50 Average    Recognition Discrimination Index 5 >16 Within Normal Limits      Recognition Hits 6/6 >16 Within Normal Limits      False Positive Errors 1 11-16 Below Average        Attention/Executive Function:          Trail Making Test (TMT): Raw Score (T Score) Percentile     Part A 65 secs.,  0 errors (28) 2 Exceptionally Low    Part B 135 secs.,  1 error (35) 7 Well Below Average       Symbol Digit Modalities Test (SDMT): Raw Score (Z-Score) Percentile     Oral 31 (-1.61) 5 Well Below Average       NAB Attention Module, Form 1: T Score Percentile     Digits Forward 49 46 Average    Digits Backwards 40 16 Below Average       D-KEFS Color-Word Interference Test: Raw Score (Scaled Score) Percentile     Color Naming 36 secs. (9) 37 Average    Word Reading 22 secs. (12) 75 Above Average    Inhibition 84 secs. (8) 25 Average      Total Errors 3 errors (10) 50 Average    Inhibition/Switching 83 secs. (9) 37 Average      Total Errors 1 error (12) 75 Above Average       D-KEFS  20 Questions Test: Scaled Score Percentile     Total Weighted Achievement Score 12 75 Above Average    Initial Abstraction Score 17 99 Exceptionally High       Language:          Verbal Fluency Test: Raw Score  (T Score) Percentile     Phonemic Fluency (FAS) 40 (46) 34 Average    Animal Fluency 15 (40) 16 Below Average             NAB Language Module, Form 1: T Score Percentile     Naming 57 75 Above Average       Visuospatial/Visuoconstruction:      Raw Score Percentile   Clock Drawing: 10/10 --- Within Normal Limits       NAB Spatial Module, Form 1: T Score Percentile     Visual Discrimination 41 18 Below Average        Scaled Score Percentile   WAIS-IV Matrix Reasoning: 10 50 Average  WAIS-IV Visual Puzzles: 9 37 Average       Mood and Personality:      Raw Score Percentile   Geriatric Depression Scale: 3 --- Within Normal Limits  Geriatric Anxiety Scale: 9 ---  Minimal    Somatic 7 --- Mild    Cognitive 1 --- Minimal    Affective 1 --- Minimal       Additional Questionnaires:      Raw Score Percentile   PROMIS Sleep Disturbance Questionnaire: 18 --- None to Slight   Informed Consent and Coding/Compliance:   Mr. Scamardo was provided with a verbal description of the nature and purpose of the present neuropsychological evaluation. Also reviewed were the foreseeable risks and/or discomforts and benefits of the procedure, limits of confidentiality, and mandatory reporting requirements of this provider. The patient was given the opportunity to ask questions and receive answers about the evaluation. Oral consent to participate was provided by the patient.   This evaluation was conducted by Christia Reading, Ph.D., licensed clinical neuropsychologist. Mr. Lewy completed a 30-minute clinical interview, billed as one unit [phone], and 135 minutes of cognitive testing, billed as one unit [phone] and four additional units [phone]. Psychometrist Milana Kidney, B.S., assisted Dr. Melvyn Novas with test administration and scoring procedures. As a separate and discrete service, Dr. Melvyn Novas spent a total of 180 minutes in interpretation and report writing, billed as one unit [id number] and two units [id number].

## 2018-11-24 ENCOUNTER — Ambulatory Visit (INDEPENDENT_AMBULATORY_CARE_PROVIDER_SITE_OTHER): Payer: Medicare Other | Admitting: Psychology

## 2018-11-24 ENCOUNTER — Encounter: Payer: Self-pay | Admitting: Psychology

## 2018-11-24 ENCOUNTER — Other Ambulatory Visit: Payer: Self-pay

## 2018-11-24 DIAGNOSIS — G20 Parkinson's disease: Secondary | ICD-10-CM

## 2018-11-24 NOTE — Progress Notes (Signed)
   NEUROPSYCHOLOGICAL EVALUATION - Feedback [place] Department of Neurology  Reason for Referral:   Patrick Ross a 73 y.o. Caucasian male referred by Alonza Bogus, D.O.,to characterize hiscurrent cognitive functioning and assist with diagnostic clarity and treatment planning in the context of a history of Parkinson's disease and DBS candidacy.  Feedback:   Patrick Ross completed a comprehensive neuropsychological evaluation on 11/17/2018. Briefly, results suggested mild subcortical/frontal subcortical dysfunction as evidenced by weaknesses across processing speed and certain aspects of executive functioning (i.e., cognitive flexibility and working memory). This pattern of cognitive performance is common in individuals diagnosed with Parkinson's disease, which represents the likely etiology of these deficits. Relative to his previous evaluations, scores did appear somewhat suppressed across several cognitive domains, including processing speed, working memory, cognitive flexibility, semantic fluency, and verbal and visual learning and memory. However, outside of changes across the former 3 domains, exhibited declines were generally mild and current scores remained within appropriate normative ranges given premorbid intellectual estimates. Recommendations included progressing through DBS pre-surgical work-up and a repeat evaluation in 18-24 months following his procedure.  Patrick Ross was accompanied by his wife. Content of the current session focused on the results of his recent evaluation, as well as how there results compared with prior evaluations. Patrick Ross and his wife were given the opportunity to ask questions and all their questions were answered. He was also encouraged to reach out should additional questions arise.     A total of 15 minutes were spent with Patrick Ross and his wife during the current feedback session.

## 2018-11-27 NOTE — Progress Notes (Signed)
Virtual Visit via Video Note The purpose of this virtual visit is to provide medical care while limiting exposure to the novel coronavirus.    Consent was obtained for video visit:  Yes.   Answered questions that patient had about telehealth interaction:  Yes.   I discussed the limitations, risks, security and privacy concerns of performing an evaluation and management service by telemedicine. I also discussed with the patient that there may be a patient responsible charge related to this service. The patient expressed understanding and agreed to proceed.  Pt location: Home Physician Location: home Name of referring provider:  Jefm Petty, MD I connected with Patrick Ross at patients initiation/request on 11/29/2018 at 10:15 AM EDT by video enabled telemedicine application and verified that I am speaking with the correct person using two identifiers. Pt MRN:  [id number] Pt DOB:  05-09-1945 Video Participants:  Patrick Ross;  Wife supplements the history   History of Present Illness:  Patient seen today in follow-up from his testing with Dr. Melvyn Novas.  Patient is currently on Stalevo, 200 mg 4 times per day and an additional carbidopa/levodopa 25/100, 1 tablet 3 times per day.  He is also on carbidopa/levodopa 50/200 at bedtime.  He is on amantadine, 100 mg 3 times per day.  He had testing done with Dr. Melvyn Novas on November 17, 2018.  This demonstrated mild cognitive impairment.  I did talk to Dr. Melvyn Novas about the case.  There were declines and cognitive domains relative to his previous evaluations.  However, it was not felt that this was unexpected for his years of Parkinson's disease.  It was also not felt that he had dementia at this point.  Patient expresses desire to proceed with possible revision surgery.  He expresses that he thinks that the right side of the body is the issue.  He previously had implanted the right DBS (left side of the body) and he did well with that.  The issue was when  he had the left stn implanted, and he has not felt that he did as well after that.  His goals are to decrease medication load and decrease dyskinesia.   Current Outpatient Medications on File Prior to Visit  Medication Sig Dispense Refill  . acetaminophen (TYLENOL) 325 MG tablet Take 325 mg by mouth every 6 (six) hours as needed (pain).     Marland Kitchen amantadine (SYMMETREL) 100 MG capsule TAKE 1 CAPSULE THREE TIMES A DAY AT 7 A.M., 1 P.M., AND 7 P.M. 21 capsule 0  . Ascorbic Acid (VITAMIN C) 100 MG tablet Take 100 mg by mouth daily.    . carbidopa-levodopa (SINEMET CR) 50-200 MG tablet Take 1 tablet by mouth at bedtime.    . carbidopa-levodopa (SINEMET IR) 25-100 MG tablet TAKE 1 TABLET FOUR TIMES A DAY (7 AM, 11 AM, 3 PM, 7 PM) 84 tablet 0  . carbidopa-levodopa-entacapone (STALEVO) 50-200-200 MG tablet Take 1 tablet by mouth 4 (four) times daily. 7am/11am/3pm/7pm    . Cholecalciferol (VITAMIN D3) 2000 units capsule Take 2,000 Units by mouth daily.     . clonazePAM (KLONOPIN) 0.5 MG tablet 1.5 tablets at bedtime 135 tablet 1  . gabapentin (NEURONTIN) 300 MG capsule Take 1 capsule by mouth 2 (two) times daily.     . Levothyroxine Sodium 137 MCG CAPS Take 137 mcg by mouth daily at 2 PM.    . lisinopril (PRINIVIL,ZESTRIL) 20 MG tablet Take 1 tablet by mouth daily.    . Magnesium Oxide 400 MG  CAPS Take 1 capsule by mouth daily.     . Multiple Vitamin (MULTIVITAMIN) capsule Take 1 capsule by mouth daily.     . Omega-3 Fatty Acids (FISH OIL) 1000 MG CAPS Take by mouth.    Marland Kitchen omeprazole (PRILOSEC) 20 MG capsule     . Testosterone 10 MG/ACT (2%) GEL Place 6 Pump onto the skin daily. 3 PUMPS TO EACH THIGH    . TURMERIC PO Take 1 tablet by mouth daily.     . Vitamin Mixture (VITAMIN E COMPLETE) CAPS Take 1 capsule by mouth daily.      No current facility-administered medications on file prior to visit.      Observations/Objective:   Vitals:   11/29/18 0823  Weight: 208 lb (94.3 kg)  Height: 6\' 3"  (1.905  m)   GEN:  The patient appears stated age and is in NAD.  Neurological examination:  Orientation: The patient is alert and oriented x3. Cranial nerves: There is good facial symmetry. There is mild facial hypomimia.  The speech is fluent and mildly dysarthric. Soft palate rises symmetrically and there is no tongue deviation. Hearing is intact to conversational tone. Motor: Strength is at least antigravity x 4.   Shoulder shrug is equal and symmetric.  There is no pronator drift.  Movement examination: Tone: unable Abnormal movements: there is RUE dyskinesia. Coordination:  There is mild decremation with RAM's, with finger taps and hand opening and closing on the left Gait and Station: The patient pushes off of the chair to arise.  He walks well with a walker.    Assessment and Plan:   1. Idiopathic Parkinson's disease, diagnosed in the year 2000 -The patient underwent right STN DBS on December 10, 2011 and a left STN DBS on December 27, 2012. Postoperatively, his left STN DBS lead was found to be displaced and had revision of the left lead on February 02, 2013 (pulled back 1 cm without MER).His IPG was last changed on February 02, 2018. -Postop images done in 2019 indicate that the left lead still appears to be inferior in location compared to that of the right lead, despite the fact that this has been revised on one occasion. Patient very much wants this lead revised.  talked about r/b of this.  Would like to do on/off test first.  -will go ahead and order pre-op MRI   -will schedule appt with Dr. Vertell Limber to discuss  -Patient is on Stalevo, 200 mg 4 times per day and an additional carbidopa/levodopa 25/100, 1 tablet 3 times per day. -Continue amantadine, 100 mg 3 times per day. 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  I  talked to the patient about risks/benefits/side effects of DBS therapy.  We talked about risks which included but were not limited to infection, paralysis, intraoperative seizure, death, stroke, bleeding around the electrode.   I talked to patient about fiducial placement 1 week prior to DBS therapy.  I talked to the patient about what to expect in the operating room, including the fact that this is an awake surgery.   We also talked about the fact that the patient will need to be off of medications for surgery.  The patient and family were given the opportunity to ask questions, which they did, and I answered them to the best of my ability today.  2.REM behavior disorder -Better, although not completely  controlled, with clonazepam 0.5 mg, 1.5 tablets nightly.  3. urinary incontinence -Now following with Dr. Felipa Eth in [facility].    4.Low back pain -Following with Dr. Letta Pate and [facility] neurosurgery.  States that spinal cord stimulator has been recommended.  He asks me about that.  I told them to ask whoever he is going to see for that about whether this is MRI compatible (most or not).  If he decides to proceed with that, I would like to go ahead and get new MRI brain images before he has the spinal cord stimulator, in anticipation of DBS revision.  Follow Up Instructions:  10/9 for on/off   -I discussed the assessment and treatment plan with the patient. The patient was provided an opportunity to ask questions and all were answered. The patient agreed with the plan and demonstrated an understanding of the instructions.   The patient was advised to call back or seek an in-person evaluation if the symptoms worsen or if the condition fails to improve as anticipated.    Total Time spent in visit with the patient was:  35 min, of which more than 50% of the time was spent in counseling.   Pt understands and agrees with the plan of care  outlined.     Alonza Bogus, DO

## 2018-11-29 ENCOUNTER — Encounter: Payer: Self-pay | Admitting: Neurology

## 2018-11-29 ENCOUNTER — Other Ambulatory Visit: Payer: Self-pay

## 2018-11-29 ENCOUNTER — Telehealth (INDEPENDENT_AMBULATORY_CARE_PROVIDER_SITE_OTHER): Payer: Medicare Other | Admitting: Neurology

## 2018-11-29 VITALS — Ht 75.0 in | Wt 208.0 lb

## 2018-11-29 DIAGNOSIS — G20 Parkinson's disease: Secondary | ICD-10-CM

## 2018-11-29 NOTE — Addendum Note (Signed)
Addended by: Ranae Plumber on: 11/29/2018 01:17 PM   Modules accepted: Orders

## 2018-12-05 ENCOUNTER — Other Ambulatory Visit (HOSPITAL_COMMUNITY): Payer: Self-pay | Admitting: Neurosurgery

## 2018-12-05 ENCOUNTER — Other Ambulatory Visit: Payer: Self-pay | Admitting: Neurosurgery

## 2018-12-05 DIAGNOSIS — G20 Parkinson's disease: Secondary | ICD-10-CM

## 2018-12-05 NOTE — Patient Instructions (Addendum)
1.  Your DBS is tentatively scheduled for 11/17 with fiducials on 11/10 2.  The following schedule is for weaning medication pre-surgery:  -stop Stalevo, amantadine and carbidopa/levodopa 25/100 24-hours prior to your surgery on 11/17 (last dose the evening of 11/15)

## 2018-12-05 NOTE — Progress Notes (Signed)
Patrick Ross was seen today in follow up for Parkinsons disease.  Pt is currently on Stalevo, 200 mg 4 times per day, along with an additional carbidopa/levodopa 25/100, 1 tablet 3 times per day.  He is also on carbidopa/levodopa 50/200 at bedtime.  Last visit, I had him hold his Osmolex and then restarted it at a lower dose as the higher dose caused dizziness.  The lower dose did not seem to cause that problem.  I sent in a prescription for Osmolex, 193 mg.  They ended up calling us back in stating that they were unable to afford the $60 co-pay for this medication.   He is back on amantadine 100 mg tid.   He has been working with Dr. Letta Pate regarding his back pain.  He/his wife sent many messages here regarding his back, but we did redirected him to Dr. Dianna Limbo office.  He is therefore not on this medication.  He is has been receiving Botox for dystonia from Dr. Letta Pate in his legs, last of which was on August 14, 2018.  He also had a consultation at neurosurgery at [facility] on August 18.  Those notes are not completed, but the patient states that a spinal cord stimulator was recommended for his back pain.  I did review the notes from neurosurgery from [facility] from July 23 with the nurse practitioner.   Wife does state that she is giving him doterra oils and it is helping the pain.  I did refer the [facility] to [facility] since our last visit.  He states that he did go.  I did not get a copy of notes.  Patient states that he didn't have a good experience.  He ended up going to Dr. Felipa Eth in [facility].  He didn't get medication but states that he is doing better in that regard..  pt denies falls.  In regards to his REM behavior disorder, he is on clonazepam.  That was increased to 1-1/2 tablets at night last visit.  He reports that it helps mostly but sometimes he will have an acting out episode.  He has had falls but usually without the walker (like in a cluttered place).  With one  fall, he was fishing and he just rolled on the ground and didn't get hurt.    12/08/18 update: Patient seen today for levodopa challenge test.  He generally is on Stalevo, 200 mg 4 times per day and an additional carbidopa/levodopa 25/100, 1 tablet 3 times per day.  He is also usually on amantadine, 100 mg 3 times per day.  He is also on carbidopa/levodopa 50/200 at bedtime.  He has been off of all of his Parkinson's medication for 22 hours.  He feels terrible off of medication, but did sleep better than he thought he would.  He got in here better than he thought he would, not requiring a wheelchair.  He is short of breath off of medication.   PREVIOUS MEDICATIONS: amantadine; klonopin; carbidopa/levodopa 25/100; carbidopa/levodopa 50/200; stalevo; Osmolex -had dizziness with higher dosages and unable to afford the $60 per month co-pay for the lower dose.  ALLERGIES:   Allergies  Allergen Reactions   Other Other (See Comments)    Surgical spray prior to adhesive causes blisters   Sulfamethoxazole Other (See Comments)    Unsure--childhood allergy    CURRENT MEDICATIONS:  Outpatient Encounter Medications as of 12/08/2018  Medication Sig   acetaminophen (TYLENOL) 325 MG tablet Take 325 mg by  mouth every 6 (six) hours as needed (pain).    amantadine (SYMMETREL) 100 MG capsule TAKE 1 CAPSULE THREE TIMES A DAY AT 7 A.M., 1 P.M., AND 7 P.M.   Ascorbic Acid (VITAMIN C) 100 MG tablet Take 100 mg by mouth daily.   carbidopa-levodopa (SINEMET CR) 50-200 MG tablet Take 1 tablet by mouth at bedtime.   carbidopa-levodopa (SINEMET IR) 25-100 MG tablet TAKE 1 TABLET FOUR TIMES A DAY (7 AM, 11 AM, 3 PM, 7 PM)   carbidopa-levodopa-entacapone (STALEVO) 50-200-200 MG tablet Take 1 tablet by mouth 4 (four) times daily. 7am/11am/3pm/7pm   Cholecalciferol (VITAMIN D3) 2000 units capsule Take 2,000 Units by mouth daily.    clonazePAM (KLONOPIN) 0.5 MG tablet 1.5 tablets at bedtime   gabapentin  (NEURONTIN) 300 MG capsule Take 1 capsule by mouth 2 (two) times daily.    Levothyroxine Sodium 137 MCG CAPS Take 137 mcg by mouth daily at 2 PM.   lisinopril (PRINIVIL,ZESTRIL) 20 MG tablet Take 1 tablet by mouth daily.   Magnesium Oxide 400 MG CAPS Take 1 capsule by mouth daily.    Multiple Vitamin (MULTIVITAMIN) capsule Take 1 capsule by mouth daily.    Omega-3 Fatty Acids (FISH OIL) 1000 MG CAPS Take by mouth.   omeprazole (PRILOSEC) 20 MG capsule    Testosterone 10 MG/ACT (2%) GEL Place 6 Pump onto the skin daily. 3 PUMPS TO EACH THIGH   TURMERIC PO Take 1 tablet by mouth daily.    Vitamin Mixture (VITAMIN E COMPLETE) CAPS Take 1 capsule by mouth daily.    [EXPIRED] carbidopa-levodopa (SINEMET IR) 25-100 MG per tablet immediate release 3 tablet    No facility-administered encounter medications on file as of 12/08/2018.     PAST MEDICAL HISTORY:   Past Medical History:  Diagnosis Date   GERD (gastroesophageal reflux disease)    History of kidney stones    Hypertension    Hypothyroidism    Lumbar stenosis    OSA (obstructive sleep apnea)    wears cpap   Parkinson's disease ([place])    PONV (postoperative nausea and vomiting)    REM behavioral disorder    Urinary incontinence     PAST SURGICAL HISTORY:   Past Surgical History:  Procedure Laterality Date   BACK SURGERY     DEEP BRAIN STIMULATOR PLACEMENT     left 12/27/2012, right 12/10/2011, left revision 02/02/2013, replacement bilateral 11/21/2015   KNEE SURGERY     NEPHRECTOMY     SPINAL FUSION     SUBTHALAMIC STIMULATOR BATTERY REPLACEMENT Left 02/02/2018   Procedure: Change implantable pulse generator battery, Left chest;  Surgeon: Erline Levine, MD;  Location: [facility];  Service: Neurosurgery;  Laterality: Left;   TONSILLECTOMY      SOCIAL HISTORY:   Social History   Socioeconomic History   Marital status: Married    Spouse name: Not on file   Number of children: 3   Years of education:  Not on file   Highest education level: Master's degree (e.g., MA, MS, MEng, MEd, MSW, MBA)  Occupational History   Not on file  Social Needs   Financial resource strain: Not on file   Food insecurity    Worry: Not on file    Inability: Not on file   Transportation needs    Medical: Not on file    Non-medical: Not on file  Tobacco Use   Smoking status: Never Smoker   Smokeless tobacco: Never Used  Substance and Sexual Activity   Alcohol  use: Not Currently   Drug use: Never   Sexual activity: Not on file  Lifestyle   Physical activity    Days per week: Not on file    Minutes per session: Not on file   Stress: Not on file  Relationships   Social connections    Talks on phone: Not on file    Gets together: Not on file    Attends religious service: Not on file    Active member of club or organization: Not on file    Attends meetings of clubs or organizations: Not on file    Relationship status: Not on file   Intimate partner violence    Fear of current or ex partner: Not on file    Emotionally abused: Not on file    Physically abused: Not on file    Forced sexual activity: Not on file  Other Topics Concern   Not on file  Social History Narrative   Not on file    FAMILY HISTORY:   Family Status  Relation Name Status   Mother  Alive   Father  Deceased   Brother  Alive   Child x3 Alive    ROS:  Review of Systems  Constitutional: Positive for malaise/fatigue.  HENT: Negative.   Eyes: Negative.   Respiratory: Positive for shortness of breath (with being off of medication).   Cardiovascular: Negative.   Gastrointestinal: Negative.   Genitourinary: Positive for frequency.  Musculoskeletal: Positive for back pain.  Skin: Negative.   Endo/Heme/Allergies: Negative.     PHYSICAL EXAMINATION:    VITALS:   Vitals:   12/08/18 0808  BP: (!) 176/94  Pulse: 82  SpO2: 99%  Weight: 206 lb (93.4 kg)  Height: 6\' 3"  (1.905 m)    GEN:  The patient  appears stated age and is in NAD. HEENT:  Normocephalic, atraumatic.  The mucous membranes are moist. The superficial temporal arteries are without ropiness or tenderness. CV:  RRR.  Patient has dyspnea on exertion when he is off of medication (much better once he is back on his medication) Lungs:  CTAB Neck/HEME:  There are no carotid bruits bilaterally.  Neurological examination:  Orientation: The patient is alert and oriented x3. Cranial nerves: There is good facial symmetry with facial hypomimia. The speech is fluent and dysarthric. Soft palate rises symmetrically and there is no tongue deviation. Hearing is intact to conversational tone. Sensation: Sensation is intact to light touch throughout Motor: Strength is at least antigravity x4.  Levodopa challenge done today.  UPDRS motor off score was 38 off of meds and off of DBS.  Pt then given 300 mg of levodopa dissolved in ginger ale and waited 30 minutes to re-examine him.  UPDRS motor on score was 22.  Details of UPDRS motor score documented on separate neurophysiologic worksheet.    Movement examination: Tone: There is normal tone in the upper and lower extremities. Abnormal movements: He has no rigidity off meds/off DBS but he is very bradykinetic.    He has no dyskinesia off meds/off DBS.  He is very short of breath off meds/off DBS.  He is no longer short of breath after he is given levodopa.  After the DBS is turned on, he has mild dyskinesia, but it does take a while for this to arise.  This is mostly in the legs, right greater than left. Coordination:  There is marked decremation with rapid alternating movements in the right lower extremity off of meds and off  of DBS.  This is improved with medication. Gait and Station: The patient requires assistance out of the chair off meds/off DBS.  Even with his U step walker, he requires a 1 person assist holding him when he is off meds/off DBS.  When he is given levodopa, he is able to arise out  of the chair when he pushes off, although he does have difficulty.  He walks better with medication.  Both before and after medication is given, the right leg sticks to the ground in the turn and he freezes in the turn.   ASSESSMENT/PLAN:  1. Idiopathic Parkinson's disease, diagnosed in the year 2000 -The patient underwent right STN DBS on December 10, 2011 and a left STN DBS on December 27, 2012. Postoperatively, his left STN DBS lead was found to be displaced and had revision of the left lead on February 02, 2013 (pulled back 1 cm without MER).His IPG was last changed on February 02, 2018. -Postop images done in 2019 indicate that the left lead still appears to be inferior in location compared to that of the right lead, despite the fact that thishas been revised on one occasion. Patient very much wants this lead revised. talked about r/b of this.  Would like to do on/off test first.             -pre-op MRI ordered and to be done on 12/13/18.  MRI was notified today that this needed to be done on an 1.5 Tesla scanner, in the presence of the DBS rep.  The DBS rep was also contacted today.             -Patient is on Stalevo, 200 mg 4 times per day and an additional carbidopa/levodopa 25/100, 1 tablet 3 times per day. -Continue amantadine, 100 mg 3 times per day. 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  I talked to the patient about risks/benefits/side effects of DBS therapy.  We talked about risks which included but were not limited to infection, paralysis, intraoperative seizure, death, stroke, bleeding around the electrode.   I talked to patient about fiducial placement 1 week prior to DBS therapy.  I talked to the patient about what to expect in the operating room, including the fact that this is an awake surgery.   We also  talked about the fact that the patient will need to be off of medications for surgery.  We had a long discussion regarding the goals of surgery.  We discussed that I do not think that this is going to help the fact that the right leg sticks to the ground, especially in the turns.   Discussed with the patient that the goal of surgery would be to help the right sided dyskinesia, which I think is both a consequence of medication as well as DBS lead to being somewhat inferior.  The patient and family were given the opportunity to ask questions, which they did, and I answered them to the best of my ability today.  -pre-op video done today  -Patient given a weaning schedule for how to wean medication prior to surgery.  He was given postoperative appointments at our office as well.  Case scheduled for 11/17  2.REM behavior disorder -Better, although not completely controlled, with clonazepam 0.5 mg, 1.5 tablets nightly.  3. urinary  incontinence -Now following [facility] in [facility].  4.Low back pain -Following with Dr. Letta Pate and [facility] neurosurgery.States that spinal cord stimulator has been recommended.   5.  I will see the patient back postoperatively.  Total face-to-face time was 60 minutes, which did not include the time waiting for medication to kick in (although I was in the room during some of that time answering patient/wifes questions).  This also did not include DBS interrogation time, which was required today to ensure safety of MRI.    Cc:  Jefm Petty, MD

## 2018-12-08 ENCOUNTER — Other Ambulatory Visit: Payer: Self-pay

## 2018-12-08 ENCOUNTER — Ambulatory Visit (INDEPENDENT_AMBULATORY_CARE_PROVIDER_SITE_OTHER): Payer: Medicare Other | Admitting: Neurology

## 2018-12-08 ENCOUNTER — Encounter: Payer: Self-pay | Admitting: Neurology

## 2018-12-08 VITALS — BP 176/94 | HR 82 | Ht 75.0 in | Wt 206.0 lb

## 2018-12-08 DIAGNOSIS — G20 Parkinson's disease: Secondary | ICD-10-CM | POA: Diagnosis not present

## 2018-12-08 DIAGNOSIS — G249 Dystonia, unspecified: Secondary | ICD-10-CM

## 2018-12-08 MED ORDER — CARBIDOPA-LEVODOPA 25-100 MG PO TABS
3.0000 | ORAL_TABLET | Freq: Once | ORAL | Status: AC
  Administered 2018-12-08: 09:00:00 3 via ORAL

## 2018-12-08 NOTE — Procedures (Signed)
DBS Programming was performed.    Total time spent programming was 5 minutes.  Device was confirmed to be on.  Soft start was confirmed to be on.  Impedences were checked and were within normal limits (all electrode impedences were checked for MRI safety).  Battery was checked and was 2.90 on the left.   Battery on the right was normal at 2.66.  Final settings were as follows:  Left brain electrode:     2-1+           ; Amplitude  4.7   V   ; Pulse width 90 microseconds;   Frequency   135   Hz.  Right brain electrode:     3-C+          ; Amplitude   3.2  V ;  Pulse width 90  microseconds;  Frequency   135    Hz.

## 2018-12-08 NOTE — Progress Notes (Signed)
Pt given 3 tablets of Carbidopa-Levodopa 25/100 mg PO crush with ginger ale in clinic today. Pt finished drink at 8:53 AM

## 2018-12-13 ENCOUNTER — Other Ambulatory Visit: Payer: Self-pay

## 2018-12-13 ENCOUNTER — Ambulatory Visit (HOSPITAL_COMMUNITY)
Admission: RE | Admit: 2018-12-13 | Discharge: 2018-12-13 | Disposition: A | Discharge status: Home / Self Care | Payer: Medicare Other | Source: Ambulatory Visit | Attending: Neurology | Admitting: Neurology

## 2018-12-13 DIAGNOSIS — G20 Parkinson's disease: Secondary | ICD-10-CM | POA: Insufficient documentation

## 2018-12-14 ENCOUNTER — Telehealth: Payer: Self-pay | Admitting: Neurology

## 2018-12-14 ENCOUNTER — Telehealth: Payer: Self-pay

## 2018-12-14 DIAGNOSIS — G20 Parkinson's disease: Secondary | ICD-10-CM

## 2018-12-14 NOTE — Telephone Encounter (Signed)
-----   Message from [place], DO sent at 12/14/2018  9:23 AM EDT ----- I spoke with MRI this morning.  Please put in MRI with contrast (DBS protocol but with contrast only since the rest was done yesterday) 1 mm slices.  Needs to be done in next 3 weeks as surgery is in November.  Thanks.

## 2018-12-14 NOTE — Telephone Encounter (Signed)
ORDER PLACED

## 2018-12-14 NOTE — Telephone Encounter (Signed)
Please let Patrick Ross know that we will likely need to send him back for more images on MRI.  Doesn't look like the pre-op DBS protocol was ordered because that protocol is with and without contrast and no contrast was given to him.

## 2018-12-14 NOTE — Telephone Encounter (Signed)
Called scheduling patient is scheduled for MRI on 01/01/19 @ 9:00 am arriving at 8:30 am  Pt made aware of this.

## 2018-12-14 NOTE — Telephone Encounter (Signed)
Spoke with patient he was made aware of provider response.

## 2018-12-15 ENCOUNTER — Other Ambulatory Visit: Payer: Self-pay | Admitting: Neurology

## 2018-12-26 MED ORDER — CLONAZEPAM 0.5 MG PO TABS: ORAL_TABLET | ORAL | 0 refills | Status: DC

## 2019-01-01 ENCOUNTER — Other Ambulatory Visit: Payer: Self-pay

## 2019-01-01 ENCOUNTER — Other Ambulatory Visit: Payer: Self-pay | Admitting: Neurology

## 2019-01-01 ENCOUNTER — Ambulatory Visit (HOSPITAL_COMMUNITY)
Admission: RE | Admit: 2019-01-01 | Discharge: 2019-01-01 | Disposition: A | Discharge status: Home / Self Care | Payer: Medicare Other | Source: Ambulatory Visit | Attending: Neurology | Admitting: Neurology

## 2019-01-01 DIAGNOSIS — G20 Parkinson's disease: Secondary | ICD-10-CM

## 2019-01-01 LAB — CREATININE, SERUM
Creatinine, Ser: 0.83 mg/dL (ref 0.61–1.24)
GFR calc Af Amer: >60 mL/min (ref >60)
GFR calc non Af Amer: >60 mL/min (ref >60)

## 2019-01-01 MED ORDER — GADOBUTROL 1 MMOL/ML IV SOLN
9.0000 mL | Freq: Once | INTRAVENOUS | Status: AC | PRN
  Administered 2019-01-01: 11:00:00 9 mL via INTRAVENOUS

## 2019-01-09 ENCOUNTER — Other Ambulatory Visit: Payer: Self-pay

## 2019-01-09 ENCOUNTER — Ambulatory Visit (HOSPITAL_COMMUNITY)
Admission: RE | Admit: 2019-01-09 | Discharge: 2019-01-09 | Disposition: A | Discharge status: Home / Self Care | Payer: Medicare Other | Attending: Neurosurgery | Admitting: Neurosurgery

## 2019-01-09 ENCOUNTER — Encounter (HOSPITAL_COMMUNITY)
Admission: RE | Disposition: A | Discharge status: Home / Self Care | Payer: Self-pay | Source: Home / Self Care | Attending: Neurosurgery

## 2019-01-09 ENCOUNTER — Ambulatory Visit (HOSPITAL_COMMUNITY)
Admission: RE | Admit: 2019-01-09 | Discharge: 2019-01-09 | Disposition: A | Discharge status: Home / Self Care | Payer: Medicare Other | Source: Ambulatory Visit | Attending: Neurosurgery | Admitting: Neurosurgery

## 2019-01-09 DIAGNOSIS — G20 Parkinson's disease: Secondary | ICD-10-CM | POA: Diagnosis present

## 2019-01-09 DIAGNOSIS — Z79899 Other long term (current) drug therapy: Secondary | ICD-10-CM | POA: Diagnosis not present

## 2019-01-09 DIAGNOSIS — Z882 Allergy status to sulfonamides status: Secondary | ICD-10-CM | POA: Insufficient documentation

## 2019-01-09 DIAGNOSIS — Z79890 Hormone replacement therapy (postmenopausal): Secondary | ICD-10-CM | POA: Diagnosis not present

## 2019-01-09 HISTORY — PX: MINOR PLACEMENT OF FIDUCIAL: SHX6748

## 2019-01-09 SURGERY — MINOR PLACEMENT OF FIDUCIAL
Anesthesia: LOCAL

## 2019-01-09 MED ORDER — BACITRACIN ZINC 500 UNIT/GM EX OINT
TOPICAL_OINTMENT | CUTANEOUS | Status: AC
  Filled 2019-01-09: qty 28.35

## 2019-01-09 MED ORDER — LIDOCAINE-EPINEPHRINE 1 %-1:100000 IJ SOLN
INTRAMUSCULAR | Status: AC
  Filled 2019-01-09: qty 1

## 2019-01-09 SURGICAL SUPPLY — 19 items
BAG ATCL THK3 35X25 (MISCELLANEOUS) ×1 IMPLANT
BAG BIOHAZARD 25X35 (MISCELLANEOUS) ×1
BLADE CLIPPER SPEC (BLADE) ×2 IMPLANT
BLADE SURG 11 STRL SS (BLADE) ×2 IMPLANT
BNDG ADH 1X3 SHEER STRL LF (GAUZE/BANDAGES/DRESSINGS) ×10 IMPLANT
COVER BACK TABLE 60X90IN (DRAPES) ×2 IMPLANT
COVER WAND RF STERILE (DRAPES) ×2 IMPLANT
DRAPE HALF SHEET 40X57 (DRAPES) ×2 IMPLANT
DRAPE SHEET LG 3/4 BI-LAMINATE (DRAPES) ×2 IMPLANT
GAUZE SPONGE 4X4 12PLY STRL (GAUZE/BANDAGES/DRESSINGS) ×6 IMPLANT
GLOVE BIO SURGEON STRL SZ8 (GLOVE) ×4 IMPLANT
GLOVE ECLIPSE 8.5 STRL (GLOVE) ×2 IMPLANT
NEEDLE HYPO 18GX1.5 BLUNT FILL (NEEDLE) ×2 IMPLANT
NEEDLE HYPO 25X1 1.5 SAFETY (NEEDLE) ×2 IMPLANT
SOL PREP POV-IOD 4OZ 10% (MISCELLANEOUS) ×2 IMPLANT
STAPLER SKIN PROX WIDE 3.9 (STAPLE) ×2 IMPLANT
SUT ETHILON 3 0 PS 1 (SUTURE) ×10 IMPLANT
SYR CONTROL 10ML LL (SYRINGE) ×2 IMPLANT
TOWEL GREEN STERILE (TOWEL DISPOSABLE) ×2 IMPLANT

## 2019-01-09 NOTE — Brief Op Note (Signed)
01/09/2019  8:55 AM  PATIENT:  Patrick Ross  73 y.o. male  PRE-OPERATIVE DIAGNOSIS:  Parkinsons  POST-OPERATIVE DIAGNOSIS:  * No post-op diagnosis entered *  PROCEDURE:  Procedure(s) with comments: Fiducial placement (N/A) - Fiducial placement  SURGEON:  Surgeon(s) and Role:    Erline Levine, MD - Primary  PHYSICIAN ASSISTANT:   ASSISTANTS: Poteat, RN   ANESTHESIA:   local  EBL: Minimal  BLOOD ADMINISTERED:none  DRAINS: none   LOCAL MEDICATIONS USED:  LIDOCAINE   SPECIMEN:  No Specimen  DISPOSITION OF SPECIMEN:  N/A  COUNTS:  YES  TOURNIQUET:  * No tourniquets in log *  DICTATION: Indications:  Patient has Parkinson's Disease and presents for Star Fix Fiducial Placement for upcoming DBS STN placement.    Procedure:  Patient was brought to the operating room.  His scalp was shaved.  Areas of planned fiducial placement were marked, scalp was prepped with betadine.  Scalp was infiltrated with lidocaine with epinephrine.  Four fiducials were placed according to standard landmarks through stab incisions.  3-0 Nylon sutures were placed and sterile dressings were applied.  Patient tolerated procedure well.  He was taken to recovery.  PLAN OF CARE: Discharge home  PATIENT DISPOSITION:  Short Stay   Delay start of Pharmacological VTE agent (>24hrs) due to surgical blood loss or risk of bleeding: yes

## 2019-01-09 NOTE — Op Note (Signed)
01/09/2019  8:55 AM  PATIENT:  Patrick Ross  73 y.o. male  PRE-OPERATIVE DIAGNOSIS:  Parkinsons  POST-OPERATIVE DIAGNOSIS:  * No post-op diagnosis entered *  PROCEDURE:  Procedure(s) with comments: Fiducial placement (N/A) - Fiducial placement  SURGEON:  Surgeon(s) and Role:    Erline Levine, MD - Primary  PHYSICIAN ASSISTANT:   ASSISTANTS: Poteat, RN   ANESTHESIA:   local  EBL: Minimal  BLOOD ADMINISTERED:none  DRAINS: none   LOCAL MEDICATIONS USED:  LIDOCAINE   SPECIMEN:  No Specimen  DISPOSITION OF SPECIMEN:  N/A  COUNTS:  YES  TOURNIQUET:  * No tourniquets in log *  DICTATION: Indications:  Patient has Parkinson's Disease and presents for Star Fix Fiducial Placement for upcoming DBS STN placement.    Procedure:  Patient was brought to the operating room.  His scalp was shaved.  Areas of planned fiducial placement were marked, scalp was prepped with betadine.  Scalp was infiltrated with lidocaine with epinephrine.  Four fiducials were placed according to standard landmarks through stab incisions.  3-0 Nylon sutures were placed and sterile dressings were applied.  Patient tolerated procedure well.  He was taken to recovery.  PLAN OF CARE: Discharge home  PATIENT DISPOSITION:  Short Stay   Delay start of Pharmacological VTE agent (>24hrs) due to surgical blood loss or risk of bleeding: yes

## 2019-01-09 NOTE — H&P (Signed)
Patient ID:   [id number] Patient: Patrick Ross  Date of Birth: 11-01-1945 Visit Type: Office Visit   Date: 01/01/2019 02:00 PM Provider: Marchia Meiers. Vertell Limber MD   This 73 year old male presents for DBS.  HISTORY OF PRESENT ILLNESS: 1.  DBS  Patient returns to discuss revision of left STN DBS electrode.    Right STN DBS electrode was placed 12/10/2011 and continues to provide good symptom coverage. Left STN DBS electrode was originally placed 12/27/2012 and revised 02/02/2013.  Stem coverage remains suboptimal.  Right chest IPG changed September 2017. Left chest IPG changed 02/02/2018.  Patient would also like Dr. Vertell Limber opinion regarding lumbar pain, and understands this may be evaluated after DBS surgery.  I went over the details of the surgery with the patient and showed him models of exactly what we would doing and review the surgical plan with him and his wife.  The plan is to change the left knee which is malpositioned.       PAST MEDICAL HISTORY, SURGICAL HISTORY, FAMILY HISTORY, SOCIAL HISTORY AND REVIEW OF SYSTEMS I have reviewed the patient's past medical, surgical, family and social history as well as the comprehensive review of systems as included on the [facility] history form dated 01/30/2018, which I have signed.   MEDICATIONS: (added, continued or stopped this visit) Started Medication Directions Instruction Stopped  amantadine HCl 100 mg tablet take 1 tablet by oral route 2 times every day    carbidopa 25 mg-levodopa 100 mg tablet take 1 tablet by oral route 3 times every day    carbidopa ER 25 mg-levodopa 100 mg tablet,extended release take 1 tablet by oral route 2 times every day    Fortesta 10 mg/0.5 gram/actuation transdermal gel pump apply (40MG )  by topical route  every day in the morning to the front and inside area of the thighs    gabapentin 300 mg capsule take 1 capsule by oral route 3 times every  day    Ibuprofen IB 200 mg tablet take 4 tablet by oral route  every 8 hours as needed with food    Synthroid 25 mcg tablet take 1 tablet by oral route  every day    Tylenol 325 mg tablet take 1 tablet by oral route  every 4 hours as needed      ALLERGIES: Ingredient Reaction Medication Name Comment SULFA (SULFONAMIDE ANTIBIOTICS)    ADHESIVE     Reviewed, no changes.    PHYSICAL EXAM:  Vitals Date Temp F BP Pulse Ht In Wt Lb BMI BSA Pain Score 01/01/2019 96 126/73 82 74 209.4 26.89  2/10     IMPRESSION:  Patient has Parkinson's and will undergo left STN Leigh revision for malposition and lead.  This will be done Star Fix and fiducials.  PLAN: Proceed with left STN DBS lead and possible battery revision  Orders: Instruction(s)/Education: Assessment Instruction R03.0 Lifestyle education [phone] Dietary management education, guidance, and counseling  Completed Orders (this encounter) Order Details Reason Side Interpretation Result Initial Treatment Date Region Lifestyle education Patient will monitor and contact primary care physician if needed.       Dietary management education, guidance, and counseling Encouraged patient to eat well balanced diet.        Assessment/Plan  # Detail Type Description  1. Assessment Parkinsons (G20).     2. Assessment Elevated blood-pressure reading, w/o diagnosis of htn (R03.0).     3. Assessment Body mass index (BMI) 26.0-26.9, adult [id number]).  Plan Orders Today's instructions / counseling include(s) Dietary management education, guidance, and counseling. Clinical information/comments: Encouraged patient to eat well balanced diet.       Pain Management Plan Pain Scale: 2/10. Method: Numeric Pain Intensity Scale. Location: back. Onset: 11/01/2014. Duration: varies. Quality: discomforting. Pain management follow-up plan of care: Patient will  continue medication management.              Provider:  Marchia Meiers. Vertell Limber MD  01/06/2019 02:35 PM    Dictation edited by: Marchia Meiers. Vertell Limber    CC Providers: [facility]efm Petty [facility] [address]  [place]  [address]-   Rebecca Tat  [address] [place], [place] [address]               Electronically signed by Marchia Meiers. Vertell Limber MD on 01/06/2019 02:35 PM   Patient ID:   [id number] Patient: Patrick Ross  Date of Birth: [birth year] Visit Type: Office Visit   Date: 01/30/2018 11:30 AM Provider: Marchia Meiers. Vertell Limber MD   This 73 year old male presents for Brain/ DBS.  HISTORY OF PRESENT ILLNESS: 1.  Brain/ DBS  Patrick Ross, 73 year old retired male, visits on referral from Dr. Carles Collet to discuss revision of left chest implanted pulse [employer]tronic generator/battery.  Patient reports that his right-sided DBS provides more improvement of his symptoms than his left-sided DBS.  Not currently on any blood thinners  12/10/2011 right STN DBS 12/27/2012 left STN DBS with revision 02/02/2013 September 2017 both batteries changed  History:  Parkinson's, hypothyroidism, sleep apnea (CPAP) Surgical history:  Tonsillectomy 1974, partial right nephrectomy 2010, lumbar surgeries 1991, 2010, 2013, 2014, 2016, 2018; deep brain stimulator surgeries as listed above      Medical/Surgical/Interim History Reviewed, no change.     Family History: Reviewed, no changes.    Social History: Reviewed, no changes.   MEDICATIONS: (added, continued or stopped this visit) Started Medication Directions Instruction Stopped  amantadine HCl 100 mg tablet take 1 tablet by oral route 2 times every day    carbidopa 25 mg-levodopa 100 mg tablet take 1 tablet by oral route 3 times every day    carbidopa ER 25 mg-levodopa 100 mg tablet,extended release take 1 tablet by oral route 2 times every day    Fortesta 10 mg/0.5  gram/actuation transdermal gel pump apply (40MG )  by topical route  every day in the morning to the front and inside area of the thighs    gabapentin 300 mg capsule take 1 capsule by oral route 3 times every day    Ibuprofen IB 200 mg tablet take 4 tablet by oral route  every 8 hours as needed with food    Synthroid 25 mcg tablet take 1 tablet by oral route  every day    Tylenol 325 mg tablet take 1 tablet by oral route  every 4 hours as needed      ALLERGIES: Ingredient Reaction Medication Name Comment SULFA (SULFONAMIDE ANTIBIOTICS)    ADHESIVE     Reviewed, updated.   REVIEW OF SYSTEMS  See scanned patient registration form, dated, signed and dated on   Review of Systems Details System Neg/Pos Details Constitutional Negative Chills, Fatigue, Fever, Malaise, Night sweats, Weight gain and Weight loss. ENMT Negative Ear drainage, Hearing loss, Nasal drainage, Otalgia, Sinus pressure and Sore throat. Eyes Negative Eye discharge, Eye pain and Vision changes. Respiratory Negative Chronic cough, Cough, Dyspnea, Known TB exposure and Wheezing. Cardio Negative Chest pain,  Claudication, Edema and Irregular heartbeat/palpitations. GI Negative Abdominal pain, Blood in stool, Change in stool pattern, Constipation, Decreased appetite, Diarrhea, Heartburn, Nausea and Vomiting. GU Negative Dribbling, Dysuria, Erectile dysfunction, Hematuria, Polyuria (Genitourinary), Slow stream, Urinary frequency, Urinary incontinence and Urinary retention. Endocrine Negative Cold intolerance, Heat intolerance, Polydipsia and Polyphagia. Neuro Negative Dizziness, Extremity weakness, Gait disturbance, Headache, Memory impairment, Numbness in extremity, Seizures and Tremors. Psych Negative Anxiety, Depression and Insomnia. Integumentary Negative Brittle hair, Brittle nails, Change in shape/size of mole(s), Hair loss, Hirsutism, Hives, Pruritus, Rash and Skin  lesion. MS Negative Back pain, Joint pain, Joint swelling, Muscle weakness and Neck pain. Hema/Lymph Negative Easy bleeding, Easy bruising and Lymphadenopathy. Allergic/Immuno Negative Contact allergy, Environmental allergies, Food allergies and Seasonal allergies. Reproductive Negative Penile discharge and Sexual dysfunction.  PHYSICAL EXAM:  Vitals Date Temp F BP Pulse Ht In Wt Lb BMI BSA Pain Score 01/30/2018  172/105 106 74 225 28.89  0/10   PHYSICAL EXAM Details General Level of Distress: no acute distress Overall Appearance: normal  Head and Face  Right Left  Fundoscopic Exam:  normal normal    Cardiovascular Cardiac: regular rate and rhythm without murmur  Right Left  Carotid Pulses: normal normal  Respiratory Lungs: clear to auscultation  Neurological Orientation: normal Recent and Remote Memory: normal Attention Span and Concentration:   normal Language: normal Fund of Knowledge: normal  Right Left Sensation: normal normal Upper Extremity Coordination: normal normal  Lower Extremity Coordination: normal normal  Musculoskeletal Gait and Station: normal  Right Left Upper Extremity Muscle Strength: normal normal Lower Extremity Muscle Strength: normal normal Upper Extremity Muscle Tone:  normal normal Lower Extremity Muscle Tone: normal normal   Motor Strength Upper and lower extremity motor strength was tested in the clinically pertinent muscles.     Deep Tendon Reflexes  Right Left Biceps: normal normal Triceps: normal normal Brachioradialis: normal normal Patellar: normal normal Achilles: normal normal  Cranial Nerves II. Optic Nerve/Visual Fields: normal III. Oculomotor: normal IV. Trochlear: normal V. Trigeminal: normal VI. Abducens: normal VII. Facial: normal VIII. Acoustic/Vestibular: normal IX. Glossopharyngeal: normal X. Vagus: normal XI. Spinal Accessory: normal XII. Hypoglossal: normal  Motor and  other Tests Lhermittes: negative Rhomberg: negative Pronator drift: absent     Right Left Hoffman's: normal normal Clonus: normal normal Babinski: normal normal   Additional Findings:  Upon examination, patient utilizes a rollator to aid in walking, right greater than left rigidity, right greater than left bradykinesia.    IMPRESSION:  Upon examination, patient utilizes a rollator to aid in walking, right greater than left rigidity, right greater than left bradykinesia.  Change battery in left chest IPG scheduled for 02/02/18. Patient prefers sedation instead of general anesthesia. Recommended patient postpone routine colonoscopy 1 month due to his battery change.  PLAN: 1. Left chest IPG battery change scheduled for 02/02/18 2. Follow-up after battery change  Orders: Instruction(s)/Education: Assessment Instruction R03.0 Hypertension education Z68.28 Lifestyle education regarding diet  Completed Orders (this encounter) Order Details Reason Side Interpretation Result Initial Treatment Date Region Hypertension education Continue to monitor blood pressure, if blood pressure remains elevated contact primary doctor       Lifestyle education regarding diet Patient encourage to eat a well balance diet        Assessment/Plan  # Detail Type Description  1. Assessment Parkinsons (G20).     2. Assessment End of battery life of deep brain stimulator (Z45.42).     3. Assessment Body mass index (BMI) 28.0-28.9, adult [id number]).  Plan Orders  Today's instructions / counseling include(s) Lifestyle education regarding diet. Clinical information/comments: Patient encourage to eat a well balance diet.     4. Assessment Elevated blood-pressure reading, w/o diagnosis of htn (R03.0).       Pain Management Plan Pain Scale: 0/10. Method: Numeric Pain Intensity Scale. Location: brain. Onset: 11/01/2014. Duration: varies. Quality:  discomforting. Pain management follow-up plan of care: Patient is taking OTC pain relieves for relief.  Fall Risk Plan The patient has not fallen in the last year.              Provider:  Marchia Meiers. Vertell Limber MD  01/30/2018 05:26 PM Dictation edited by: Mirian Mo    CC Providers: Alonza Bogus  [address] [place], [place] [address]               Electronically signed by Marchia Meiers. Vertell Limber MD on 01/31/2018 07:35 AM

## 2019-01-09 NOTE — H&P (View-Only) (Signed)
Patient ID:   [id number] Patient: Patrick Ross  Date of Birth: Apr 30, 1945 Visit Type: Office Visit   Date: 01/01/2019 02:00 PM Provider: Marchia Meiers. Vertell Limber MD   This 73 year old male presents for DBS.  HISTORY OF PRESENT ILLNESS: 1.  DBS  Patient returns to discuss revision of left STN DBS electrode.    Right STN DBS electrode was placed 12/10/2011 and continues to provide good symptom coverage. Left STN DBS electrode was originally placed 12/27/2012 and revised 02/02/2013.  Stem coverage remains suboptimal.  Right chest IPG changed September 2017. Left chest IPG changed 02/02/2018.  Patient would also like Dr. Vertell Limber opinion regarding lumbar pain, and understands this may be evaluated after DBS surgery.  I went over the details of the surgery with the patient and showed him models of exactly what we would doing and review the surgical plan with him and his wife.  The plan is to change the left knee which is malpositioned.       PAST MEDICAL HISTORY, SURGICAL HISTORY, FAMILY HISTORY, SOCIAL HISTORY AND REVIEW OF SYSTEMS I have reviewed the patient's past medical, surgical, family and social history as well as the comprehensive review of systems as included on the [facility] history form dated 01/30/2018, which I have signed.   MEDICATIONS: (added, continued or stopped this visit) Started Medication Directions Instruction Stopped  amantadine HCl 100 mg tablet take 1 tablet by oral route 2 times every day    carbidopa 25 mg-levodopa 100 mg tablet take 1 tablet by oral route 3 times every day    carbidopa ER 25 mg-levodopa 100 mg tablet,extended release take 1 tablet by oral route 2 times every day    Fortesta 10 mg/0.5 gram/actuation transdermal gel pump apply (40MG )  by topical route  every day in the morning to the front and inside area of the thighs    gabapentin 300 mg capsule take 1 capsule by oral route 3 times every  day    Ibuprofen IB 200 mg tablet take 4 tablet by oral route  every 8 hours as needed with food    Synthroid 25 mcg tablet take 1 tablet by oral route  every day    Tylenol 325 mg tablet take 1 tablet by oral route  every 4 hours as needed      ALLERGIES: Ingredient Reaction Medication Name Comment SULFA (SULFONAMIDE ANTIBIOTICS)    ADHESIVE     Reviewed, no changes.    PHYSICAL EXAM:  Vitals Date Temp F BP Pulse Ht In Wt Lb BMI BSA Pain Score 01/01/2019 96 126/73 82 74 209.4 26.89  2/10     IMPRESSION:  Patient has Parkinson's and will undergo left STN Leigh revision for malposition and lead.  This will be done [employer] and fiducials.  PLAN: Proceed with left STN DBS lead and possible battery revision  Orders: Instruction(s)/Education: Assessment Instruction R03.0 Lifestyle education [phone] Dietary management education, guidance, and counseling  Completed Orders (this encounter) Order Details Reason Side Interpretation Result Initial Treatment Date Region Lifestyle education Patient will monitor and contact primary care physician if needed.       Dietary management education, guidance, and counseling Encouraged patient to eat well balanced diet.        Assessment/Plan  # Detail Type Description  1. Assessment Parkinsons (G20).     2. Assessment Elevated blood-pressure reading, w/o diagnosis of htn (R03.0).     3. Assessment Body mass index (BMI) 26.0-26.9, adult [id number]).  Plan Orders Today's instructions / counseling include(s) Dietary management education, guidance, and counseling. Clinical information/comments: Encouraged patient to eat well balanced diet.       Pain Management Plan Pain Scale: 2/10. Method: Numeric Pain Intensity Scale. Location: back. Onset: 11/01/2014. Duration: varies. Quality: discomforting. Pain management follow-up plan of care: Patient will  continue medication management.              Provider:  Marchia Meiers. Vertell Limber MD  01/06/2019 02:35 PM    Dictation edited by: Marchia Meiers. Vertell Limber    CC Providers: Jefm Petty [facility] [address]  [place]  [phone]   Rebecca Tat  [address] [place], [place] [id number]               Electronically signed by Marchia Meiers. Vertell Limber MD on 01/06/2019 02:35 PM   Patient ID:   [id number] Patient: Patrick Ross  Date of Birth: 1945/06/18 Visit Type: Office Visit   Date: 01/30/2018 11:30 AM Provider: Marchia Meiers. Vertell Limber MD   This 73 year old male presents for Brain/ DBS.  HISTORY OF PRESENT ILLNESS: 1.  Brain/ DBS  Patrick Ross, 73 year old retired male, visits on referral from Dr. Carles Collet to discuss revision of left chest implanted pulse [employer] generator/battery.  Patient reports that his right-sided DBS provides more improvement of his symptoms than his left-sided DBS.  Not currently on any blood thinners  12/10/2011 right STN DBS 12/27/2012 left STN DBS with revision 02/02/2013 September 2017 both batteries changed  History:  Parkinson's, hypothyroidism, sleep apnea (CPAP) Surgical history:  Tonsillectomy 1974, partial right nephrectomy 2010, lumbar surgeries 1991, 2010, 2013, 2014, 2016, 2018; deep brain stimulator surgeries as listed above      Medical/Surgical/Interim History Reviewed, no change.     Family History: Reviewed, no changes.    Social History: Reviewed, no changes.   MEDICATIONS: (added, continued or stopped this visit) Started Medication Directions Instruction Stopped  amantadine HCl 100 mg tablet take 1 tablet by oral route 2 times every day    carbidopa 25 mg-levodopa 100 mg tablet take 1 tablet by oral route 3 times every day    carbidopa ER 25 mg-levodopa 100 mg tablet,extended release take 1 tablet by oral route 2 times every day    Fortesta 10 mg/0.5  gram/actuation transdermal gel pump apply (40MG )  by topical route  every day in the morning to the front and inside area of the thighs    gabapentin 300 mg capsule take 1 capsule by oral route 3 times every day    Ibuprofen IB 200 mg tablet take 4 tablet by oral route  every 8 hours as needed with food    Synthroid 25 mcg tablet take 1 tablet by oral route  every day    Tylenol 325 mg tablet take 1 tablet by oral route  every 4 hours as needed      ALLERGIES: Ingredient Reaction Medication Name Comment SULFA (SULFONAMIDE ANTIBIOTICS)    ADHESIVE     Reviewed, updated.   REVIEW OF SYSTEMS  See scanned patient registration form, dated, signed and dated on   Review of Systems Details System Neg/Pos Details Constitutional Negative Chills, Fatigue, Fever, Malaise, Night sweats, Weight gain and Weight loss. ENMT Negative Ear drainage, Hearing loss, Nasal drainage, Otalgia, Sinus pressure and Sore throat. Eyes Negative Eye discharge, Eye pain and Vision changes. Respiratory Negative Chronic cough, Cough, Dyspnea, Known TB exposure and Wheezing. Cardio Negative Chest pain,  Claudication, Edema and Irregular heartbeat/palpitations. GI Negative Abdominal pain, Blood in stool, Change in stool pattern, Constipation, Decreased appetite, Diarrhea, Heartburn, Nausea and Vomiting. GU Negative Dribbling, Dysuria, Erectile dysfunction, Hematuria, Polyuria (Genitourinary), Slow stream, Urinary frequency, Urinary incontinence and Urinary retention. Endocrine Negative Cold intolerance, Heat intolerance, Polydipsia and Polyphagia. Neuro Negative Dizziness, Extremity weakness, Gait disturbance, Headache, Memory impairment, Numbness in extremity, Seizures and Tremors. Psych Negative Anxiety, Depression and Insomnia. Integumentary Negative Brittle hair, Brittle nails, Change in shape/size of mole(s), Hair loss, Hirsutism, Hives, Pruritus, Rash and Skin  lesion. MS Negative Back pain, Joint pain, Joint swelling, Muscle weakness and Neck pain. Hema/Lymph Negative Easy bleeding, Easy bruising and Lymphadenopathy. Allergic/Immuno Negative Contact allergy, Environmental allergies, Food allergies and Seasonal allergies. Reproductive Negative Penile discharge and Sexual dysfunction.  PHYSICAL EXAM:  Vitals Date Temp F BP Pulse Ht In Wt Lb BMI BSA Pain Score 01/30/2018  172/105 106 74 225 28.89  0/10   PHYSICAL EXAM Details General Level of Distress: no acute distress Overall Appearance: normal  Head and Face  Right Left  Fundoscopic Exam:  normal normal    Cardiovascular Cardiac: regular rate and rhythm without murmur  Right Left  Carotid Pulses: normal normal  Respiratory Lungs: clear to auscultation  Neurological Orientation: normal Recent and Remote Memory: normal Attention Span and Concentration:   normal Language: normal Fund of Knowledge: normal  Right Left Sensation: normal normal Upper Extremity Coordination: normal normal  Lower Extremity Coordination: normal normal  Musculoskeletal Gait and Station: normal  Right Left Upper Extremity Muscle Strength: normal normal Lower Extremity Muscle Strength: normal normal Upper Extremity Muscle Tone:  normal normal Lower Extremity Muscle Tone: normal normal   Motor Strength Upper and lower extremity motor strength was tested in the clinically pertinent muscles.     Deep Tendon Reflexes  Right Left Biceps: normal normal Triceps: normal normal Brachioradialis: normal normal Patellar: normal normal Achilles: normal normal  Cranial Nerves II. Optic Nerve/Visual Fields: normal III. Oculomotor: normal IV. Trochlear: normal V. Trigeminal: normal VI. Abducens: normal VII. Facial: normal VIII. Acoustic/Vestibular: normal IX. Glossopharyngeal: normal X. Vagus: normal XI. Spinal Accessory: normal XII. Hypoglossal: normal  Motor and  other Tests Lhermittes: negative Rhomberg: negative Pronator drift: absent     Right Left Hoffman's: normal normal Clonus: normal normal Babinski: normal normal   Additional Findings:  Upon examination, patient utilizes a rollator to aid in walking, right greater than left rigidity, right greater than left bradykinesia.    IMPRESSION:  Upon examination, patient utilizes a rollator to aid in walking, right greater than left rigidity, right greater than left bradykinesia.  Change battery in left chest IPG scheduled for 02/02/18. Patient prefers sedation instead of general anesthesia. Recommended patient postpone routine colonoscopy 1 month due to his battery change.  PLAN: 1. Left chest IPG battery change scheduled for 02/02/18 2. Follow-up after battery change  Orders: Instruction(s)/Education: Assessment Instruction R03.0 Hypertension education Z68.28 Lifestyle education regarding diet  Completed Orders (this encounter) Order Details Reason Side Interpretation Result Initial Treatment Date Region Hypertension education Continue to monitor blood pressure, if blood pressure remains elevated contact primary doctor       Lifestyle education regarding diet Patient encourage to eat a well balance diet        Assessment/Plan  # Detail Type Description  1. Assessment Parkinsons (G20).     2. Assessment End of battery life of deep brain stimulator (Z45.42).     3. Assessment Body mass index (BMI) 28.0-28.9, adult [id number]).  Plan Orders  Today's instructions / counseling include(s) Lifestyle education regarding diet. Clinical information/comments: Patient encourage to eat a well balance diet.     4. Assessment Elevated blood-pressure reading, w/o diagnosis of htn (R03.0).       Pain Management Plan Pain Scale: 0/10. Method: Numeric Pain Intensity Scale. Location: brain. Onset: 11/01/2014. Duration: varies. Quality:  discomforting. Pain management follow-up plan of care: Patient is taking OTC pain relieves for relief.  Fall Risk Plan The patient has not fallen in the last year.              Provider:  Marchia Meiers. Vertell Limber MD  01/30/2018 05:26 PM Dictation edited by: Mirian Mo    CC Providers: Alonza Bogus  [address] [place], [place] [address]               Electronically signed by Marchia Meiers. Vertell Limber MD on 01/31/2018 07:35 AM

## 2019-01-09 NOTE — Interval H&P Note (Signed)
History and Physical Interval Note:  01/09/2019 8:10 AM  Patrick Ross  has presented today for surgery, with the diagnosis of Parkinsons.  The various methods of treatment have been discussed with the patient and family. After consideration of risks, benefits and other options for treatment, the patient has consented to  Procedure(s) with comments: Fiducial placement (N/A) - Fiducial placement as a surgical intervention.  The patient's history has been reviewed, patient examined, no change in status, stable for surgery.  I have reviewed the patient's chart and labs.  Questions were answered to the patient's satisfaction.     Peggyann Shoals

## 2019-01-09 NOTE — H&P (View-Only) (Signed)
Patient ID:   [id number] Patient: Patrick Ross  Date of Birth: 12-Jun-1945 Visit Type: Office Visit   Date: 01/01/2019 02:00 PM Provider: Marchia Meiers. Vertell Limber MD   This 73 year old male presents for DBS.  HISTORY OF PRESENT ILLNESS: 1.  DBS  Patient returns to discuss revision of left STN DBS electrode.    Right STN DBS electrode was placed 12/10/2011 and continues to provide good symptom coverage. Left STN DBS electrode was originally placed 12/27/2012 and revised 02/02/2013.  Stem coverage remains suboptimal.  Right chest IPG changed September 2017. Left chest IPG changed 02/02/2018.  Patient would also like Dr. Vertell Limber opinion regarding lumbar pain, and understands this may be evaluated after DBS surgery.  I went over the details of the surgery with the patient and showed him models of exactly what we would doing and review the surgical plan with him and his wife.  The plan is to change the left knee which is malpositioned.       PAST MEDICAL HISTORY, SURGICAL HISTORY, FAMILY HISTORY, SOCIAL HISTORY AND REVIEW OF SYSTEMS I have reviewed the patient's past medical, surgical, family and social history as well as the comprehensive review of systems as included on the [facility] history form dated 01/30/2018, which I have signed.   MEDICATIONS: (added, continued or stopped this visit) Started Medication Directions Instruction Stopped  amantadine HCl 100 mg tablet take 1 tablet by oral route 2 times every day    carbidopa 25 mg-levodopa 100 mg tablet take 1 tablet by oral route 3 times every day    carbidopa ER 25 mg-levodopa 100 mg tablet,extended release take 1 tablet by oral route 2 times every day    Fortesta 10 mg/0.5 gram/actuation transdermal gel pump apply (40MG )  by topical route  every day in the morning to the front and inside area of the thighs    gabapentin 300 mg capsule take 1 capsule by oral route 3 times every  day    Ibuprofen IB 200 mg tablet take 4 tablet by oral route  every 8 hours as needed with food    Synthroid 25 mcg tablet take 1 tablet by oral route  every day    Tylenol 325 mg tablet take 1 tablet by oral route  every 4 hours as needed      ALLERGIES: Ingredient Reaction Medication Name Comment SULFA (SULFONAMIDE ANTIBIOTICS)    ADHESIVE     Reviewed, no changes.    PHYSICAL EXAM:  Vitals Date Temp F BP Pulse Ht In Wt Lb BMI BSA Pain Score 01/01/2019 96 126/73 82 74 209.4 26.89  2/10     IMPRESSION:  Patient has Parkinson's and will undergo left STN Leigh revision for malposition and lead.  This will be done [employer] and fiducials.  PLAN: Proceed with left STN DBS lead and possible battery revision  Orders: Instruction(s)/Education: Assessment Instruction R03.0 Lifestyle education [phone] Dietary management education, guidance, and counseling  Completed Orders (this encounter) Order Details Reason Side Interpretation Result Initial Treatment Date Region Lifestyle education Patient will monitor and contact primary care physician if needed.       Dietary management education, guidance, and counseling Encouraged patient to eat well balanced diet.        Assessment/Plan  # Detail Type Description  1. Assessment Parkinsons (G20).     2. Assessment Elevated blood-pressure reading, w/o diagnosis of htn (R03.0).     3. Assessment Body mass index (BMI) 26.0-26.9, adult [id number]).  Plan Orders Today's instructions / counseling include(s) Dietary management education, guidance, and counseling. Clinical information/comments: Encouraged patient to eat well balanced diet.       Pain Management Plan Pain Scale: 2/10. Method: Numeric Pain Intensity Scale. Location: back. Onset: 11/01/2014. Duration: varies. Quality: discomforting. Pain management follow-up plan of care: Patient will  continue medication management.              Provider:  Marchia Meiers. Vertell Limber MD  01/06/2019 02:35 PM    Dictation edited by: Marchia Meiers. Vertell Limber    CC Providers: Jefm Petty [facility] [address]  [place]  [id number]-   Rebecca Tat  [address] [place], [place] [address]               Electronically signed by Marchia Meiers. Vertell Limber MD on 01/06/2019 02:35 PM   Patient ID:   [id number] Patient: Patrick Ross  Date of Birth: 1945-12-31 Visit Type: Office Visit   Date: 01/30/2018 11:30 AM Provider: Marchia Meiers. Vertell Limber MD   This 73 year old male presents for Brain/ DBS.  HISTORY OF PRESENT ILLNESS: 1.  Brain/ DBS  Patrick Ross, 73 year old retired male, visits on referral from Dr. Carles Collet to discuss revision of left chest implanted pulse Medtronic generator/battery.  Patient reports that his right-sided DBS provides more improvement of his symptoms than his left-sided DBS.  Not currently on any blood thinners  12/10/2011 right STN DBS 12/27/2012 left STN DBS with revision 02/02/2013 September 2017 both batteries changed  History:  Parkinson's, hypothyroidism, sleep apnea (CPAP) Surgical history:  Tonsillectomy 1974, partial right nephrectomy 2010, lumbar surgeries 1991, 2010, 2013, 2014, 2016, 2018; deep brain stimulator surgeries as listed above      Medical/Surgical/Interim History Reviewed, no change.     Family History: Reviewed, no changes.    Social History: Reviewed, no changes.   MEDICATIONS: (added, continued or stopped this visit) Started Medication Directions Instruction Stopped  amantadine HCl 100 mg tablet take 1 tablet by oral route 2 times every day    carbidopa 25 mg-levodopa 100 mg tablet take 1 tablet by oral route 3 times every day    carbidopa ER 25 mg-levodopa 100 mg tablet,extended release take 1 tablet by oral route 2 times every day    Fortesta 10 mg/0.5  gram/actuation transdermal gel pump apply (40MG )  by topical route  every day in the morning to the front and inside area of the thighs    gabapentin 300 mg capsule take 1 capsule by oral route 3 times every day    Ibuprofen IB 200 mg tablet take 4 tablet by oral route  every 8 hours as needed with food    Synthroid 25 mcg tablet take 1 tablet by oral route  every day    Tylenol 325 mg tablet take 1 tablet by oral route  every 4 hours as needed      ALLERGIES: Ingredient Reaction Medication Name Comment SULFA (SULFONAMIDE ANTIBIOTICS)    ADHESIVE     Reviewed, updated.   REVIEW OF SYSTEMS  See scanned patient registration form, dated, signed and dated on   Review of Systems Details System Neg/Pos Details Constitutional Negative Chills, Fatigue, Fever, Malaise, Night sweats, Weight gain and Weight loss. ENMT Negative Ear drainage, Hearing loss, Nasal drainage, Otalgia, Sinus pressure and Sore throat. Eyes Negative Eye discharge, Eye pain and Vision changes. Respiratory Negative Chronic cough, Cough, Dyspnea, Known TB exposure and Wheezing. Cardio Negative Chest pain,  Claudication, Edema and Irregular heartbeat/palpitations. GI Negative Abdominal pain, Blood in stool, Change in stool pattern, Constipation, Decreased appetite, Diarrhea, Heartburn, Nausea and Vomiting. GU Negative Dribbling, Dysuria, Erectile dysfunction, Hematuria, Polyuria (Genitourinary), Slow stream, Urinary frequency, Urinary incontinence and Urinary retention. Endocrine Negative Cold intolerance, Heat intolerance, Polydipsia and Polyphagia. Neuro Negative Dizziness, Extremity weakness, Gait disturbance, Headache, Memory impairment, Numbness in extremity, Seizures and Tremors. Psych Negative Anxiety, Depression and Insomnia. Integumentary Negative Brittle hair, Brittle nails, Change in shape/size of mole(s), Hair loss, Hirsutism, Hives, Pruritus, Rash and Skin  lesion. MS Negative Back pain, Joint pain, Joint swelling, Muscle weakness and Neck pain. Hema/Lymph Negative Easy bleeding, Easy bruising and Lymphadenopathy. Allergic/Immuno Negative Contact allergy, Environmental allergies, Food allergies and Seasonal allergies. Reproductive Negative Penile discharge and Sexual dysfunction.  PHYSICAL EXAM:  Vitals Date Temp F BP Pulse Ht In Wt Lb BMI BSA Pain Score 01/30/2018  172/105 106 74 225 28.89  0/10   PHYSICAL EXAM Details General Level of Distress: no acute distress Overall Appearance: normal  Head and Face  Right Left  Fundoscopic Exam:  normal normal    Cardiovascular Cardiac: regular rate and rhythm without murmur  Right Left  Carotid Pulses: normal normal  Respiratory Lungs: clear to auscultation  Neurological Orientation: normal Recent and Remote Memory: normal Attention Span and Concentration:   normal Language: normal Fund of Knowledge: normal  Right Left Sensation: normal normal Upper Extremity Coordination: normal normal  Lower Extremity Coordination: normal normal  Musculoskeletal Gait and Station: normal  Right Left Upper Extremity Muscle Strength: normal normal Lower Extremity Muscle Strength: normal normal Upper Extremity Muscle Tone:  normal normal Lower Extremity Muscle Tone: normal normal   Motor Strength Upper and lower extremity motor strength was tested in the clinically pertinent muscles.     Deep Tendon Reflexes  Right Left Biceps: normal normal Triceps: normal normal Brachioradialis: normal normal Patellar: normal normal Achilles: normal normal  Cranial Nerves II. Optic Nerve/Visual Fields: normal III. Oculomotor: normal IV. Trochlear: normal V. Trigeminal: normal VI. Abducens: normal VII. Facial: normal VIII. Acoustic/Vestibular: normal IX. Glossopharyngeal: normal X. Vagus: normal XI. Spinal Accessory: normal XII. Hypoglossal: normal  Motor and  other Tests Lhermittes: negative Rhomberg: negative Pronator drift: absent     Right Left Hoffman's: normal normal Clonus: normal normal Babinski: normal normal   Additional Findings:  Upon examination, patient utilizes a rollator to aid in walking, right greater than left rigidity, right greater than left bradykinesia.    IMPRESSION:  Upon examination, patient utilizes a rollator to aid in walking, right greater than left rigidity, right greater than left bradykinesia.  Change battery in left chest IPG scheduled for 02/02/18. Patient prefers sedation instead of general anesthesia. Recommended patient postpone routine colonoscopy 1 month due to his battery change.  PLAN: 1. Left chest IPG battery change scheduled for 02/02/18 2. Follow-up after battery change  Orders: Instruction(s)/Education: Assessment Instruction R03.0 Hypertension education Z68.28 Lifestyle education regarding diet  Completed Orders (this encounter) Order Details Reason Side Interpretation Result Initial Treatment Date Region Hypertension education Continue to monitor blood pressure, if blood pressure remains elevated contact primary doctor       Lifestyle education regarding diet Patient encourage to eat a well balance diet        Assessment/Plan  # Detail Type Description  1. Assessment Parkinsons (G20).     2. Assessment End of battery life of deep brain stimulator (Z45.42).     3. Assessment Body mass index (BMI) 28.0-28.9, adult [id number]).  Plan Orders  Today's instructions / counseling include(s) Lifestyle education regarding diet. Clinical information/comments: Patient encourage to eat a well balance diet.     4. Assessment Elevated blood-pressure reading, w/o diagnosis of htn (R03.0).       Pain Management Plan Pain Scale: 0/10. Method: Numeric Pain Intensity Scale. Location: brain. Onset: 11/01/2014. Duration: varies. Quality:  discomforting. Pain management follow-up plan of care: Patient is taking OTC pain relieves for relief.  Fall Risk Plan The patient has not fallen in the last year.              Provider:  Marchia Meiers. Vertell Limber MD  01/30/2018 05:26 PM Dictation edited by: Mirian Mo    CC Providers: Alonza Bogus  [address] [place], [place] [address]               Electronically signed by Marchia Meiers. Vertell Limber MD on 01/31/2018 07:35 AM

## 2019-01-10 ENCOUNTER — Encounter (HOSPITAL_COMMUNITY): Payer: Self-pay | Admitting: Neurosurgery

## 2019-01-12 ENCOUNTER — Telehealth: Payer: Self-pay | Admitting: Neurology

## 2019-01-12 ENCOUNTER — Telehealth (INDEPENDENT_AMBULATORY_CARE_PROVIDER_SITE_OTHER): Payer: Medicare Other | Admitting: Neurology

## 2019-01-12 ENCOUNTER — Other Ambulatory Visit (HOSPITAL_COMMUNITY)
Admission: RE | Admit: 2019-01-12 | Discharge: 2019-01-12 | Disposition: A | Discharge status: Home / Self Care | Payer: Medicare Other | Source: Ambulatory Visit | Attending: Neurosurgery | Admitting: Neurosurgery

## 2019-01-12 ENCOUNTER — Other Ambulatory Visit: Payer: Self-pay

## 2019-01-12 ENCOUNTER — Encounter: Payer: Self-pay | Admitting: Neurology

## 2019-01-12 DIAGNOSIS — Z01812 Encounter for preprocedural laboratory examination: Secondary | ICD-10-CM | POA: Insufficient documentation

## 2019-01-12 DIAGNOSIS — G249 Dystonia, unspecified: Secondary | ICD-10-CM

## 2019-01-12 DIAGNOSIS — G20 Parkinson's disease: Secondary | ICD-10-CM

## 2019-01-12 DIAGNOSIS — Z20828 Contact with and (suspected) exposure to other viral communicable diseases: Secondary | ICD-10-CM | POA: Insufficient documentation

## 2019-01-12 MED ORDER — CLONAZEPAM 0.5 MG PO TABS: 0.7500 mg | ORAL_TABLET | Freq: Every day | ORAL | 1 refills | Status: DC

## 2019-01-12 MED ORDER — CLONAZEPAM 0.5 MG PO TABS: 0.7500 mg | ORAL_TABLET | Freq: Every day | ORAL | 0 refills | Status: DC

## 2019-01-12 NOTE — Telephone Encounter (Signed)
Please schedule for today.

## 2019-01-12 NOTE — Telephone Encounter (Signed)
Wife is calling in with questions about medication before surgery on Tuesday. Also she is needing a refill of the clonazepam medication to the [facility] on [address] in [place] and then also [facility] he needs to continue for long term. Thanks!

## 2019-01-12 NOTE — Telephone Encounter (Signed)
Please advise on below  

## 2019-01-12 NOTE — Progress Notes (Signed)
Virtual Visit via Telephone Note The purpose of this virtual visit is to provide medical care while limiting exposure to the novel coronavirus.    Consent was obtained for phone visit:  Yes.   Answered questions that patient had about telehealth interaction:  Yes.   I discussed the limitations, risks, security and privacy concerns of performing an evaluation and management service by telephone. I also discussed with the patient that there may be a patient responsible charge related to this service. The patient expressed understanding and agreed to proceed.  Pt location: Home Physician Location: office Name of referring provider:  Jefm Petty, MD I connected with .Katha Hamming at patients initiation/request on 01/12/2019 at  1:00 PM EST by telephone and verified that I am speaking with the correct person using two identifiers.  Pt MRN:  [id number] Pt DOB:  [birth year]  Participants:  Katha Hamming; wife, Jackelyn Poling   History of Present Illness:  Pt worked in today for last min questions pre op DBS surgery.  They misplaced the old AVS, and did not know when to start medication for surgery.   Observations/Objective:   There were no vitals filed for this visit.   Assessment and Plan:   1. Idiopathic Parkinson's disease, diagnosed in the year 2000 -The patient underwent right STN DBS on December 10, 2011 and a left STN DBS on December 27, 2012. Postoperatively, his left STN DBS lead was found to be displaced and had revision of the left lead on February 02, 2013 (pulled back 1 cm without MER).His IPG was last changed on February 02, 2018. -Preop MRI has been completed and reviewed.  Patient has fiducials in place. -Patient is on Stalevo, 200 mg 4 times per day and an additional carbidopa/levodopa 25/100, 1 tablet 3 times per day. -Continue amantadine, 100 mg 3 times per day. -Patient is receiving Botox from Dr. Letta Pate for  feet dystonia and that is helping -Discussed that he should stop all Parkinson's medications after the night of the 15th.  He should be on no medication on November 16.  Surgery is on November 17.             -Long discussion with the patient and his wife regarding his DBS surgery.  The original plan was to place a new lead, but after attempting to plan his surgery, his old lead went through the lateral ventricle.  It really is too risky to reattempt to drive a new lead down through that, given the vascularity of the ventricles.  There is a marked increase risk of bleeding if we did this.  Therefore, the plan is now to put back on his old lead about 9 mm, which is how much is currently it is too deep.  Patient was agreeable.  2.REM behavior disorder -Better, although not completely controlled, with clonazepam 0.5 mg, 1.5 tablets nightly.  3. urinary incontinence -Now following withDr. Felipa Eth in [facility].  4.Low back pain -Following with Dr. Letta Pate and [facility] neuro[facility].States that spinal cord stimulator has been recommended.   Follow Up Instructions:    -I discussed the assessment and treatment plan with the patient. The patient was provided an opportunity to ask questions and all were answered. The patient agreed with the plan and demonstrated an understanding of the instructions.   The patient was advised to call back or seek an in-person evaluation if the symptoms worsen or if the condition fails to improve as anticipated.    Total Time  spent in visit with the patient was:  22 min, of which 100% of the time was spent in counseling and/or coordinating care on surgery.   Pt understands and agrees with the plan of care outlined.     Alonza Bogus, DO

## 2019-01-12 NOTE — Telephone Encounter (Signed)
Put them on at 1pm as a phone visit if okay with them

## 2019-01-14 LAB — NOVEL CORONAVIRUS, NAA (HOSP ORDER, SEND-OUT TO REF LAB; TAT 18-24 HRS): SARS-CoV-2, NAA: NOT DETECTED

## 2019-01-15 ENCOUNTER — Encounter (HOSPITAL_COMMUNITY): Payer: Self-pay | Admitting: *Deleted

## 2019-01-15 ENCOUNTER — Encounter (HOSPITAL_COMMUNITY): Payer: Self-pay | Admitting: Physician Assistant

## 2019-01-15 NOTE — Progress Notes (Signed)
Denies chest pain, shob, or cardiology visit made aware of visitor restrictions and reports quarantine post COVID test.

## 2019-01-16 ENCOUNTER — Ambulatory Visit (HOSPITAL_COMMUNITY): Payer: Medicare Other | Admitting: Certified Registered"

## 2019-01-16 ENCOUNTER — Observation Stay (HOSPITAL_COMMUNITY)
Admission: RE | Admit: 2019-01-16 | Discharge: 2019-01-17 | Disposition: A | Discharge status: Home / Self Care | Payer: Medicare Other | Attending: Neurosurgery | Admitting: Neurosurgery

## 2019-01-16 ENCOUNTER — Encounter (HOSPITAL_COMMUNITY): Payer: Self-pay | Admitting: *Deleted

## 2019-01-16 ENCOUNTER — Encounter (HOSPITAL_COMMUNITY)
Admission: RE | Disposition: A | Discharge status: Home / Self Care | Payer: Self-pay | Source: Home / Self Care | Attending: Neurosurgery

## 2019-01-16 ENCOUNTER — Observation Stay (HOSPITAL_COMMUNITY): Payer: Medicare Other

## 2019-01-16 ENCOUNTER — Other Ambulatory Visit: Payer: Self-pay | Admitting: Neurosurgery

## 2019-01-16 ENCOUNTER — Other Ambulatory Visit: Payer: Self-pay

## 2019-01-16 DIAGNOSIS — R030 Elevated blood-pressure reading, without diagnosis of hypertension: Secondary | ICD-10-CM | POA: Insufficient documentation

## 2019-01-16 DIAGNOSIS — M48061 Spinal stenosis, lumbar region without neurogenic claudication: Secondary | ICD-10-CM | POA: Diagnosis not present

## 2019-01-16 DIAGNOSIS — E039 Hypothyroidism, unspecified: Secondary | ICD-10-CM | POA: Insufficient documentation

## 2019-01-16 DIAGNOSIS — Z882 Allergy status to sulfonamides status: Secondary | ICD-10-CM | POA: Insufficient documentation

## 2019-01-16 DIAGNOSIS — Z79899 Other long term (current) drug therapy: Secondary | ICD-10-CM | POA: Insufficient documentation

## 2019-01-16 DIAGNOSIS — G20 Parkinson's disease: Secondary | ICD-10-CM | POA: Diagnosis not present

## 2019-01-16 DIAGNOSIS — Z4542 Encounter for adjustment and management of neuropacemaker (brain) (peripheral nerve) (spinal cord): Principal | ICD-10-CM | POA: Insufficient documentation

## 2019-01-16 DIAGNOSIS — G4730 Sleep apnea, unspecified: Secondary | ICD-10-CM | POA: Diagnosis not present

## 2019-01-16 DIAGNOSIS — Z79890 Hormone replacement therapy (postmenopausal): Secondary | ICD-10-CM | POA: Diagnosis not present

## 2019-01-16 DIAGNOSIS — G20A1 Parkinson's disease without dyskinesia, without mention of fluctuations: Secondary | ICD-10-CM | POA: Diagnosis present

## 2019-01-16 DIAGNOSIS — K219 Gastro-esophageal reflux disease without esophagitis: Secondary | ICD-10-CM | POA: Diagnosis not present

## 2019-01-16 HISTORY — PX: SUBTHALAMIC STIMULATOR INSERTION: SHX5375

## 2019-01-16 LAB — CBC
HCT: 44.4 % (ref 39.0–52.0)
Hemoglobin: 15 g/dL (ref 13.0–17.0)
MCH: 32.8 pg (ref 26.0–34.0)
MCHC: 33.8 g/dL (ref 30.0–36.0)
MCV: 97.2 fL (ref 80.0–100.0)
Platelets: 169 10*3/uL (ref 150–400)
RBC: 4.57 MIL/uL (ref 4.22–5.81)
RDW: 11.9 % (ref 11.5–15.5)
WBC: 4.5 10*3/uL (ref 4.0–10.5)
nRBC: 0 % (ref 0.0–0.2)

## 2019-01-16 LAB — BASIC METABOLIC PANEL
Anion gap: 9 (ref 5–15)
BUN: 19 mg/dL (ref 8–23)
CO2: 26 mmol/L (ref 22–32)
Calcium: 9.2 mg/dL (ref 8.9–10.3)
Chloride: 109 mmol/L (ref 98–111)
Creatinine, Ser: 1.08 mg/dL (ref 0.61–1.24)
GFR calc Af Amer: >60 mL/min (ref >60)
GFR calc non Af Amer: >60 mL/min (ref >60)
Glucose, Bld: 101 mg/dL — ABNORMAL HIGH (ref 70–99)
Potassium: 4.1 mmol/L (ref 3.5–5.1)
Sodium: 144 mmol/L (ref 135–145)

## 2019-01-16 LAB — TYPE AND SCREEN
ABO/RH(D): A POS
Antibody Screen: NEGATIVE

## 2019-01-16 LAB — ABO/RH: ABO/RH(D): A POS

## 2019-01-16 SURGERY — SUBTHALAMIC STIMULATOR INSERTION
Anesthesia: Monitor Anesthesia Care | Laterality: Left

## 2019-01-16 SURGERY — SUBTHALAMIC STIMULATOR INSERTION
Anesthesia: General | Laterality: Left

## 2019-01-16 MED ORDER — CARBIDOPA-LEVODOPA-ENTACAPONE 50-200-200 MG PO TABS: 1.0000 | ORAL_TABLET | Freq: Four times a day (QID) | ORAL | Status: DC

## 2019-01-16 MED ORDER — CEPHALEXIN 250 MG PO CAPS
250.0000 mg | ORAL_CAPSULE | Freq: Three times a day (TID) | ORAL | Status: DC
  Administered 2019-01-16 – 2019-01-17 (×3): 250 mg via ORAL
  Filled 2019-01-16 (×5): qty 1

## 2019-01-16 MED ORDER — DIAZEPAM 5 MG PO TABS
5.0000 mg | ORAL_TABLET | Freq: Four times a day (QID) | ORAL | Status: DC | PRN
  Administered 2019-01-16: 5 mg via ORAL
  Filled 2019-01-16: qty 1

## 2019-01-16 MED ORDER — BUPIVACAINE HCL 0.5 % IJ SOLN
INTRAMUSCULAR | Status: DC | PRN
  Administered 2019-01-16: 30 mL

## 2019-01-16 MED ORDER — SODIUM BICARBONATE 4 % IV SOLN
5.0000 mL | Freq: Once | INTRAVENOUS | Status: DC
  Filled 2019-01-16: qty 5

## 2019-01-16 MED ORDER — CHLORHEXIDINE GLUCONATE CLOTH 2 % EX PADS: 6.0000 | MEDICATED_PAD | Freq: Once | CUTANEOUS | Status: DC

## 2019-01-16 MED ORDER — SODIUM BICARBONATE 4 % IV SOLN
INTRAVENOUS | Status: DC | PRN
  Administered 2019-01-16: 3 mL via INTRAVENOUS

## 2019-01-16 MED ORDER — LIDOCAINE-EPINEPHRINE 1 %-1:100000 IJ SOLN
INTRAMUSCULAR | Status: DC | PRN
  Administered 2019-01-16: 30 mL

## 2019-01-16 MED ORDER — PANTOPRAZOLE SODIUM 40 MG IV SOLR: 40.0000 mg | Freq: Every day | INTRAVENOUS | Status: DC

## 2019-01-16 MED ORDER — SODIUM CHLORIDE 0.9% FLUSH: 3.0000 mL | INTRAVENOUS | Status: DC | PRN

## 2019-01-16 MED ORDER — LEVOTHYROXINE SODIUM 137 MCG PO TABS
137.0000 ug | ORAL_TABLET | Freq: Every day | ORAL | Status: DC
  Administered 2019-01-17: 137 ug via ORAL
  Filled 2019-01-16: qty 1

## 2019-01-16 MED ORDER — ALUM & MAG HYDROXIDE-SIMETH 200-200-20 MG/5ML PO SUSP: 30.0000 mL | Freq: Four times a day (QID) | ORAL | Status: DC | PRN

## 2019-01-16 MED ORDER — SACCHAROMYCES BOULARDII 250 MG PO CAPS
250.0000 mg | ORAL_CAPSULE | Freq: Every day | ORAL | Status: DC
  Administered 2019-01-16: 250 mg via ORAL
  Filled 2019-01-16 (×2): qty 1

## 2019-01-16 MED ORDER — POLYETHYLENE GLYCOL 3350 17 G PO PACK: 17.0000 g | PACK | Freq: Every day | ORAL | Status: DC

## 2019-01-16 MED ORDER — BUPIVACAINE HCL (PF) 0.5 % IJ SOLN
INTRAMUSCULAR | Status: AC
  Filled 2019-01-16: qty 30

## 2019-01-16 MED ORDER — KCL IN DEXTROSE-NACL 20-5-0.45 MEQ/L-%-% IV SOLN: INTRAVENOUS | Status: DC

## 2019-01-16 MED ORDER — ONDANSETRON HCL 4 MG/2ML IJ SOLN: 4.0000 mg | Freq: Four times a day (QID) | INTRAMUSCULAR | Status: DC | PRN

## 2019-01-16 MED ORDER — CLONAZEPAM 0.5 MG PO TABS
0.7500 mg | ORAL_TABLET | Freq: Every day | ORAL | Status: DC
  Administered 2019-01-16: 0.75 mg via ORAL
  Filled 2019-01-16: qty 2

## 2019-01-16 MED ORDER — BISACODYL 10 MG RE SUPP: 10.0000 mg | Freq: Every day | RECTAL | Status: DC | PRN

## 2019-01-16 MED ORDER — TESTOSTERONE 10 MG/ACT (2%) TD GEL: 6.0000 | Freq: Every day | TRANSDERMAL | Status: DC

## 2019-01-16 MED ORDER — LISINOPRIL 20 MG PO TABS
20.0000 mg | ORAL_TABLET | Freq: Every day | ORAL | Status: DC
  Administered 2019-01-16 – 2019-01-17 (×2): 20 mg via ORAL
  Filled 2019-01-16 (×2): qty 1

## 2019-01-16 MED ORDER — ACETAMINOPHEN 500 MG PO TABS
1000.0000 mg | ORAL_TABLET | Freq: Two times a day (BID) | ORAL | Status: DC
  Administered 2019-01-16 – 2019-01-17 (×2): 1000 mg via ORAL
  Filled 2019-01-16 (×2): qty 2

## 2019-01-16 MED ORDER — PHENYLEPHRINE 40 MCG/ML (10ML) SYRINGE FOR IV PUSH (FOR BLOOD PRESSURE SUPPORT)
PREFILLED_SYRINGE | INTRAVENOUS | Status: DC | PRN
  Administered 2019-01-16: 80 ug via INTRAVENOUS

## 2019-01-16 MED ORDER — ONDANSETRON HCL 4 MG PO TABS: 4.0000 mg | ORAL_TABLET | Freq: Four times a day (QID) | ORAL | Status: DC | PRN

## 2019-01-16 MED ORDER — PROBIOTIC PO CAPS: 1.0000 | ORAL_CAPSULE | Freq: Every day | ORAL | Status: DC

## 2019-01-16 MED ORDER — MENTHOL 3 MG MT LOZG: 1.0000 | LOZENGE | OROMUCOSAL | Status: DC | PRN

## 2019-01-16 MED ORDER — HYDROCODONE-ACETAMINOPHEN 5-325 MG PO TABS
1.0000 | ORAL_TABLET | ORAL | Status: DC | PRN
  Administered 2019-01-16 (×2): 1 via ORAL
  Filled 2019-01-16: qty 2
  Filled 2019-01-16: qty 1

## 2019-01-16 MED ORDER — BACITRACIN ZINC 500 UNIT/GM EX OINT
TOPICAL_OINTMENT | CUTANEOUS | Status: DC | PRN
  Administered 2019-01-16: 1 via TOPICAL

## 2019-01-16 MED ORDER — ACETAMINOPHEN 650 MG RE SUPP: 650.0000 mg | RECTAL | Status: DC | PRN

## 2019-01-16 MED ORDER — CARBIDOPA-LEVODOPA ER 50-200 MG PO TBCR
1.0000 | EXTENDED_RELEASE_TABLET | Freq: Every day | ORAL | Status: DC
  Filled 2019-01-16: qty 1

## 2019-01-16 MED ORDER — POLYETHYLENE GLYCOL 3350 17 G PO PACK: 17.0000 g | PACK | Freq: Every day | ORAL | Status: DC | PRN

## 2019-01-16 MED ORDER — CARBIDOPA-LEVODOPA 25-100 MG PO TABS
2.0000 | ORAL_TABLET | ORAL | Status: DC
  Administered 2019-01-16: 2 via ORAL
  Filled 2019-01-16 (×5): qty 2

## 2019-01-16 MED ORDER — FENTANYL CITRATE (PF) 100 MCG/2ML IJ SOLN: 25.0000 ug | INTRAMUSCULAR | Status: DC | PRN

## 2019-01-16 MED ORDER — CEFAZOLIN SODIUM-DEXTROSE 2-4 GM/100ML-% IV SOLN
2.0000 g | Freq: Three times a day (TID) | INTRAVENOUS | Status: AC
  Administered 2019-01-16 (×2): 2 g via INTRAVENOUS
  Filled 2019-01-16 (×2): qty 100

## 2019-01-16 MED ORDER — GABAPENTIN 300 MG PO CAPS
300.0000 mg | ORAL_CAPSULE | Freq: Two times a day (BID) | ORAL | Status: DC
  Administered 2019-01-16 – 2019-01-17 (×2): 300 mg via ORAL
  Filled 2019-01-16 (×2): qty 1

## 2019-01-16 MED ORDER — DOCUSATE SODIUM 100 MG PO CAPS
100.0000 mg | ORAL_CAPSULE | Freq: Two times a day (BID) | ORAL | Status: DC
  Administered 2019-01-16 – 2019-01-17 (×2): 100 mg via ORAL
  Filled 2019-01-16 (×2): qty 1

## 2019-01-16 MED ORDER — HYDRALAZINE HCL 20 MG/ML IJ SOLN
INTRAMUSCULAR | Status: DC | PRN
  Administered 2019-01-16 (×3): 5 mg via INTRAVENOUS

## 2019-01-16 MED ORDER — SODIUM BICARBONATE 4 % IV SOLN
5.0000 mL | Freq: Once | INTRAVENOUS | Status: AC
  Administered 2019-01-16: 5 mL via INTRAVENOUS
  Filled 2019-01-16: qty 5

## 2019-01-16 MED ORDER — SODIUM CHLORIDE 0.9 % IV SOLN: 250.0000 mL | INTRAVENOUS | Status: DC

## 2019-01-16 MED ORDER — TURMERIC 500 MG PO CAPS: ORAL_CAPSULE | Freq: Every day | ORAL | Status: DC

## 2019-01-16 MED ORDER — FLEET ENEMA 7-19 GM/118ML RE ENEM: 1.0000 | ENEMA | Freq: Once | RECTAL | Status: DC | PRN

## 2019-01-16 MED ORDER — AMANTADINE HCL 100 MG PO CAPS
100.0000 mg | ORAL_CAPSULE | ORAL | Status: DC
  Administered 2019-01-16: 100 mg via ORAL
  Filled 2019-01-16 (×5): qty 1

## 2019-01-16 MED ORDER — PHENOL 1.4 % MT LIQD: 1.0000 | OROMUCOSAL | Status: DC | PRN

## 2019-01-16 MED ORDER — LIDOCAINE-EPINEPHRINE 1 %-1:100000 IJ SOLN
INTRAMUSCULAR | Status: AC
  Filled 2019-01-16: qty 1

## 2019-01-16 MED ORDER — THROMBIN 5000 UNITS EX SOLR
OROMUCOSAL | Status: DC | PRN
  Administered 2019-01-16: 5 mL via TOPICAL

## 2019-01-16 MED ORDER — 0.9 % SODIUM CHLORIDE (POUR BTL) OPTIME
TOPICAL | Status: DC | PRN
  Administered 2019-01-16: 1000 mL

## 2019-01-16 MED ORDER — ONDANSETRON HCL 4 MG/2ML IJ SOLN: 4.0000 mg | Freq: Once | INTRAMUSCULAR | Status: DC | PRN

## 2019-01-16 MED ORDER — OXYCODONE HCL 5 MG PO TABS: 5.0000 mg | ORAL_TABLET | ORAL | Status: DC | PRN

## 2019-01-16 MED ORDER — SODIUM CHLORIDE 0.9% FLUSH
3.0000 mL | Freq: Two times a day (BID) | INTRAVENOUS | Status: DC
  Administered 2019-01-16: 3 mL via INTRAVENOUS

## 2019-01-16 MED ORDER — THROMBIN 5000 UNITS EX SOLR
CUTANEOUS | Status: AC
  Filled 2019-01-16: qty 5000

## 2019-01-16 MED ORDER — PANTOPRAZOLE SODIUM 40 MG PO TBEC
40.0000 mg | DELAYED_RELEASE_TABLET | Freq: Every day | ORAL | Status: DC
  Administered 2019-01-17: 40 mg via ORAL
  Filled 2019-01-16: qty 1

## 2019-01-16 MED ORDER — CEFAZOLIN SODIUM-DEXTROSE 2-4 GM/100ML-% IV SOLN
2.0000 g | INTRAVENOUS | Status: AC
  Administered 2019-01-16: 08:00:00 2 g via INTRAVENOUS
  Filled 2019-01-16: qty 100

## 2019-01-16 MED ORDER — ENTACAPONE 200 MG PO TABS
200.0000 mg | ORAL_TABLET | ORAL | Status: DC
  Administered 2019-01-16: 200 mg via ORAL
  Filled 2019-01-16 (×6): qty 1

## 2019-01-16 MED ORDER — CARBIDOPA-LEVODOPA 25-100 MG PO TABS
1.0000 | ORAL_TABLET | ORAL | Status: DC
  Administered 2019-01-16: 1 via ORAL
  Filled 2019-01-16 (×4): qty 1

## 2019-01-16 MED ORDER — BACITRACIN ZINC 500 UNIT/GM EX OINT
TOPICAL_OINTMENT | CUTANEOUS | Status: AC
  Filled 2019-01-16: qty 28.35

## 2019-01-16 MED ORDER — HYDROCODONE-ACETAMINOPHEN 5-325 MG PO TABS: 1.0000 | ORAL_TABLET | ORAL | Status: DC | PRN

## 2019-01-16 MED ORDER — LACTATED RINGERS IV SOLN
INTRAVENOUS | Status: DC | PRN
  Administered 2019-01-16: 08:00:00 via INTRAVENOUS

## 2019-01-16 MED ORDER — ACETAMINOPHEN 325 MG PO TABS: 650.0000 mg | ORAL_TABLET | ORAL | Status: DC | PRN

## 2019-01-16 MED ORDER — MORPHINE SULFATE (PF) 2 MG/ML IV SOLN: 2.0000 mg | INTRAVENOUS | Status: DC | PRN

## 2019-01-16 MED ORDER — PROPOFOL 10 MG/ML IV BOLUS
INTRAVENOUS | Status: DC | PRN
  Administered 2019-01-16: 30 mg via INTRAVENOUS

## 2019-01-16 MED ORDER — PROPOFOL 500 MG/50ML IV EMUL
INTRAVENOUS | Status: DC | PRN
  Administered 2019-01-16: 100 ug/kg/min via INTRAVENOUS

## 2019-01-16 SURGICAL SUPPLY — 64 items
BIT DRILL NEURO 2X3.1 SFT TUCH (MISCELLANEOUS) IMPLANT
BLADE CLIPPER SURG (BLADE) ×3 IMPLANT
BLADE SURG 11 STRL SS (BLADE) ×3 IMPLANT
BNDG ADH 1X3 SHEER STRL LF (GAUZE/BANDAGES/DRESSINGS) IMPLANT
BNDG GAUZE ELAST 4 BULKY (GAUZE/BANDAGES/DRESSINGS) IMPLANT
BOOT SUTURE AID YELLOW STND (SUTURE) ×3 IMPLANT
CANISTER SUCT 3000ML PPV (MISCELLANEOUS) ×3 IMPLANT
CARTRIDGE OIL MAESTRO DRILL (MISCELLANEOUS) ×1 IMPLANT
CLIP RANEY DISP (INSTRUMENTS) ×6 IMPLANT
COVER WAND RF STERILE (DRAPES) ×3 IMPLANT
DECANTER SPIKE VIAL GLASS SM (MISCELLANEOUS) ×6 IMPLANT
DIFFUSER DRILL AIR PNEUMATIC (MISCELLANEOUS) ×3 IMPLANT
DRAPE POUCH INSTRU U-SHP 10X18 (DRAPES) ×3 IMPLANT
DRAPE STERI IOBAN 125X83 (DRAPES) ×3 IMPLANT
DRILL NEURO 2X3.1 SOFT TOUCH (MISCELLANEOUS)
DRSG OPSITE 4X5.5 SM (GAUZE/BANDAGES/DRESSINGS) IMPLANT
DRSG TEGADERM 2-3/8X2-3/4 SM (GAUZE/BANDAGES/DRESSINGS) ×3 IMPLANT
DRSG TEGADERM 4X4.75 (GAUZE/BANDAGES/DRESSINGS) ×3 IMPLANT
DRSG TELFA 3X8 NADH (GAUZE/BANDAGES/DRESSINGS) ×3 IMPLANT
DURAPREP 26ML APPLICATOR (WOUND CARE) ×6 IMPLANT
GAUZE 4X4 16PLY RFD (DISPOSABLE) IMPLANT
GAUZE SPONGE 4X4 12PLY STRL (GAUZE/BANDAGES/DRESSINGS) IMPLANT
GLOVE BIO SURGEON STRL SZ8 (GLOVE) ×6 IMPLANT
GLOVE BIOGEL PI IND STRL 7.5 (GLOVE) ×4 IMPLANT
GLOVE BIOGEL PI IND STRL 8 (GLOVE) ×2 IMPLANT
GLOVE BIOGEL PI IND STRL 8.5 (GLOVE) ×2 IMPLANT
GLOVE BIOGEL PI INDICATOR 7.5 (GLOVE) ×8
GLOVE BIOGEL PI INDICATOR 8 (GLOVE) ×4
GLOVE BIOGEL PI INDICATOR 8.5 (GLOVE) ×4
GLOVE ECLIPSE 8.0 STRL XLNG CF (GLOVE) ×6 IMPLANT
GLOVE EXAM NITRILE XL STR (GLOVE) IMPLANT
GLOVE SURG SS PI 7.5 STRL IVOR (GLOVE) ×12 IMPLANT
GOWN STRL REUS W/ TWL LRG LVL3 (GOWN DISPOSABLE) IMPLANT
GOWN STRL REUS W/ TWL XL LVL3 (GOWN DISPOSABLE) ×3 IMPLANT
GOWN STRL REUS W/TWL 2XL LVL3 (GOWN DISPOSABLE) ×3 IMPLANT
GOWN STRL REUS W/TWL LRG LVL3 (GOWN DISPOSABLE)
GOWN STRL REUS W/TWL XL LVL3 (GOWN DISPOSABLE) ×6
HEMOSTAT POWDER KIT SURGIFOAM (HEMOSTASIS) ×3 IMPLANT
KIT BASIN OR (CUSTOM PROCEDURE TRAY) ×3 IMPLANT
KIT HEADREST PASSIVE (NEUROSURGERY SUPPLIES) ×3 IMPLANT
KIT PLATFORM UNI 4LEG STARFIX (KITS) ×3 IMPLANT
KIT SURGICAL STARFIX WAYFORM (KITS) ×3 IMPLANT
KIT SUTURE REMOVAL HAMOT (SET/KITS/TRAYS/PACK) IMPLANT
KIT TURNOVER KIT B (KITS) ×3 IMPLANT
MARKER SKIN DUAL TIP RULER LAB (MISCELLANEOUS) ×6 IMPLANT
NEEDLE HYPO 25X1 1.5 SAFETY (NEEDLE) ×6 IMPLANT
NEEDLE SPNL 18GX3.5 QUINCKE PK (NEEDLE) IMPLANT
NEEDLE SPNL 22GX3.5 QUINCKE BK (NEEDLE) IMPLANT
NS IRRIG 1000ML POUR BTL (IV SOLUTION) ×3 IMPLANT
OIL CARTRIDGE MAESTRO DRILL (MISCELLANEOUS) ×3
PACK LAMINECTOMY NEURO (CUSTOM PROCEDURE TRAY) ×3 IMPLANT
PAD ARMBOARD 7.5X6 YLW CONV (MISCELLANEOUS) IMPLANT
PERFORATOR LRG  14-11MM (BIT)
PERFORATOR LRG 14-11MM (BIT) IMPLANT
PLATFORM BILATERAL KIT (MISCELLANEOUS) ×3 IMPLANT
SPONGE SURGIFOAM ABS GEL SZ50 (HEMOSTASIS) IMPLANT
STAPLER SKIN PROX WIDE 3.9 (STAPLE) ×3 IMPLANT
SUT ETHILON 3 0 PS 1 (SUTURE) IMPLANT
SUT SILK 2 0 TIES 10X30 (SUTURE) ×3 IMPLANT
SUT VIC AB 2-0 CP2 18 (SUTURE) ×3 IMPLANT
TOWEL GREEN STERILE (TOWEL DISPOSABLE) ×3 IMPLANT
TOWEL GREEN STERILE FF (TOWEL DISPOSABLE) ×3 IMPLANT
TRAY FOLEY MTR SLVR 16FR STAT (SET/KITS/TRAYS/PACK) IMPLANT
WATER STERILE IRR 1000ML POUR (IV SOLUTION) ×3 IMPLANT

## 2019-01-16 NOTE — Evaluation (Signed)
Physical Therapy Evaluation Patient Details Name: Patrick Ross MRN: [id number] DOB: 06-Feb-1946 Today's Date: 01/16/2019   History of Present Illness  Patient is a 73 y/o male with PMH of Parkinson's Disease with Deep Brain Stimulator placed 12/10/2011 right STN DBS and 12/27/2012 left STN DBS with revision 02/02/2013; partial right nephrectomy 2010, lumbar surgeries 1991, 2010, 2013, 2014, 2016, 2018.  Admitted 01/16/19 for Left deep brain stimulator electrode revision.  Clinical Impression  Patient presents with mobility limited due to lethargy and some involuntary R foot movement during ambulation and after return to supine.  RN aware.  Feel he will benefit from skilled PT during this acute stay to maximize mobility, safety and balance prior to d/c home with wife assist.  May need follow up HHPT at d/c.      Follow Up Recommendations Home health PT;Supervision/Assistance - 24 hour    Equipment Recommendations  None recommended by PT    Recommendations for Other Services       Precautions / Restrictions Precautions Precautions: Fall      Mobility  Bed Mobility Overal bed mobility: Needs Assistance Bed Mobility: Sidelying to Sit;Sit to Supine   Sidelying to sit: Min assist   Sit to supine: Min guard   General bed mobility comments: increased time, assist for trunk; to supine assist for positioning; still with R foot involuntary movements in supine  Transfers Overall transfer level: Needs assistance Equipment used: Rolling walker (2 wheeled) Transfers: Sit to/from Stand Sit to Stand: Min guard;Supervision         General transfer comment: cues for hand placement, sat back down due to walker not tall enough, stood again once adjusted  Ambulation/Gait Ambulation/Gait assistance: Min Web designer (Feet): 90 Feet Assistive device: Rolling walker (2 wheeled) Gait Pattern/deviations: Step-through pattern;Steppage     General Gait Details: excessively lifting  R foot with ambulation at times hitting walker with his knee difficulty controlling despite cues  Stairs            Wheelchair Mobility    Modified Rankin (Stroke Patients Only)       Balance Overall balance assessment: Needs assistance Sitting-balance support: Feet supported Sitting balance-Leahy Scale: Fair     Standing balance support: Bilateral upper extremity supported Standing balance-Leahy Scale: Poor Standing balance comment: UE support for balance                             Pertinent Vitals/Pain Pain Assessment: No/denies pain    Home Living Family/patient expects to be discharged to:: Private residence Living Arrangements: Spouse/significant other Available Help at Discharge: Family Type of Home: House Home Access: Ramped entrance     Home Layout: Two level;Bed/bath upstairs Home Equipment: Shower seat;Grab bars - tub/shower;Walker - 4 wheels      Prior Function Level of Independence: Independent with assistive device(s)               Hand Dominance        Extremity/Trunk Assessment   Upper Extremity Assessment Upper Extremity Assessment: Generalized weakness    Lower Extremity Assessment Lower Extremity Assessment: Generalized weakness    Cervical / Trunk Assessment Cervical / Trunk Assessment: Kyphotic  Communication   Communication: No difficulties  Cognition Arousal/Alertness: Lethargic Behavior During Therapy: Flat affect Overall Cognitive Status: History of cognitive impairments - at baseline  General Comments: looks to his wife to give history; cues for eyes open during ambulation      General Comments General comments (skin integrity, edema, etc.): wife in the room and assisting with socks, massaging pt's foot after ambulation, etc    Exercises     Assessment/Plan    PT Assessment Patient needs continued PT services  PT Problem List Decreased activity  tolerance;Decreased cognition;Decreased mobility;Decreased balance;Decreased knowledge of use of DME       PT Treatment Interventions Gait training;Functional mobility training;Therapeutic exercise;Neuromuscular re-education;Patient/family education;DME instruction;Balance training;Therapeutic activities;Stair training    PT Goals (Current goals can be found in the Care Plan section)  Acute Rehab PT Goals Patient Stated Goal: to go home PT Goal Formulation: With patient/family Time For Goal Achievement: 01/23/19 Potential to Achieve Goals: Good    Frequency Min 3X/week   Barriers to discharge        Co-evaluation               AM-PAC PT "6 Clicks" Mobility  Outcome Measure Help needed turning from your back to your side while in a flat bed without using bedrails?: None Help needed moving from lying on your back to sitting on the side of a flat bed without using bedrails?: A Little Help needed moving to and from a bed to a chair (including a wheelchair)?: A Little Help needed standing up from a chair using your arms (e.g., wheelchair or bedside chair)?: A Little Help needed to walk in hospital room?: A Little Help needed climbing 3-5 steps with a railing? : A Little 6 Click Score: 19    End of Session   Activity Tolerance: Patient limited by fatigue Patient left: in bed;with call bell/phone within reach Nurse Communication: Mobility status PT Visit Diagnosis: Other abnormalities of gait and mobility (R26.89);Other symptoms and signs involving the nervous system (R29.898)    Time: [id number] PT Time Calculation (min) (ACUTE ONLY): 25 min   Charges:   PT Evaluation $PT Eval Moderate Complexity: 1 Mod PT Treatments $Gait Training: 8-22 mins        Magda Kiel, [place] [facility] [phone] 01/16/2019   Reginia Naas 01/16/2019, 4:18 PM

## 2019-01-16 NOTE — Op Note (Signed)
Preoperative diagnosis:  Parkinsons disease with stim induced dyskinesia Postoperative diagnosis:  Parkinsons disease Surgeon:  Erline Levine Neurologist:  Wells Guiles Annalyce Lanpher  CPT codes: [phone] (first hour) [phone] (for each additional hour) Indication for procedure: Determination of optimal electrode position for deep brain stimulation therapy. Complications: None   Description of procedure:  Following the opening of the stim lock (burr hole previously placed), Dr. Vertell Limber pulled back 7.5 mm on the L brain electrode.   Active contacts that were used and the response to stimulation, including both adverse effects and therapeutic effects, were recorded on the neurophysiologic stimulation worksheet.  Because the patient already had a battery in place, we were able to check all contacts in a monopolar fashion.  At high frequency and high pulse widths, the patient did have mild dyskinetic movements in the fingers at most contacts.  There was resolution of the tremor with no dyskinetic movements at 1-C+ 3.0V 20ms/130 Hz, which is where the patient was left at the end of surgery.    Clinical comment: spoke with wife following surgery.  May need to reduce carbidopa/levodopa 25/100 due to proper placement of electrode.  Post op MRI ordered.   Alonza Bogus, DO [facility] Director of Movement Disorders

## 2019-01-16 NOTE — Op Note (Signed)
01/16/2019  10:25 AM  PATIENT:  Patrick Ross  73 y.o. male  PRE-OPERATIVE DIAGNOSIS:  Parkinson's Disease  POST-OPERATIVE DIAGNOSIS:  Parkinson's Disease  PROCEDURE:  Procedure(s): Left deep brain stimulator electrode revision. (Left)  SURGEON:  Surgeon(s) and Role:    * Kalief Kattner, MD - Primary  PHYSICIAN ASSISTANT:   ASSISTANTS: Poteat, RN   ANESTHESIA:   local and MAC  EBL:  25 mL   BLOOD ADMINISTERED:none  DRAINS: none   LOCAL MEDICATIONS USED:  LIDOCAINE   SPECIMEN:  No Specimen  DISPOSITION OF SPECIMEN:  N/A  COUNTS:  YES  TOURNIQUET:  * No tourniquets in log *  DICTATION: DICTATION:  Indications: Patient is a 73-year-old man with Parkinson's Disease. It was elected to take him to surgery for revision of left STN  deep brain stimulator electrode placement  Procedure: Preoperative planing was performed with volumetricCT and placement of 4 fiducial markers in the skull followed by CT of the brain also obtained volumetrically. These were then exported to create Starfix head frame with planned targeting of STN nucleus electrodes was performed. The patient was brought to the operating room and placed in a semi-Fowler's position with his neck and stabilized in a neck holder. This was affixed to the Mayfield adapter. His scalp was then prepped with betadine scrub and DuraPrep and subsequently draped with an Ioban drape.with the fiducials and the skin was infiltrated with local lidocaine.  The areas of planned incision were then infiltrated with local lidocaine with epinephrine.  The left frontal curvilinear incision was reopened and the lead and stimlock cap were exposed.  The cover was removed and the locking gate on the cap was opened.  The lead was withdrawn 7.5 mm.  Subsequently the stimulating electrode was driven by placing the programmer over the IPG and the electrode was found to work well without dyskinesias. The electrode was then locked into position with the  stimlock cap. The wound was then irrigated and closed with 2-0 Vicryl sutures and staples. The fiducials were removed and staples were placed over each of these sites. The head was washed and then sterile occlusive dressings were placed. The patient was taken to recovery having tolerated her procedure without difficulty or untoward effect. Please refer to detailed stimulating electrode recordings from Dr. Tat for more specifics of the positioning of the electrodes. These are included in her Epic note from the detailed neural monitoring and physical exam assessment during the surgery.  PLAN OF CARE: Admit for overnight observation  PATIENT DISPOSITION:  PACU - hemodynamically stable.   Delay start of Pharmacological VTE agent (>24hrs) due to surgical blood loss or risk of bleeding: yes  

## 2019-01-16 NOTE — Progress Notes (Signed)
PHARMACIST - PHYSICIAN ORDER COMMUNICATION  CONCERNING: P&T Medication Policy on Herbal Medications  DESCRIPTION:  This patient's order for:  Tumeric  has been noted.  This product(s) is classified as an "herbal" or natural product. Due to a lack of definitive safety studies or FDA approval, nonstandard manufacturing practices, plus the potential risk of unknown drug-drug interactions while on inpatient medications, the Pharmacy and Therapeutics Committee does not permit the use of "herbal" or natural products of this type within [facility].   ACTION TAKEN: The pharmacy department is unable to verify this order at this time and your patient has been informed of this safety policy. Please reevaluate patient's clinical condition at discharge and address if the herbal or natural product(s) should be resumed at that time.   Borden Thune S. Alford Highland, PharmD, [facility] Clinical Staff Pharmacist

## 2019-01-16 NOTE — Anesthesia Postprocedure Evaluation (Signed)
Anesthesia Post Note  Patient: Patrick Ross  Procedure(s) Performed: Left deep brain stimulator electrode revision. (Left )     Patient location during evaluation: PACU Anesthesia Type: MAC Level of consciousness: awake and alert Pain management: pain level controlled Vital Signs Assessment: post-procedure vital signs reviewed and stable Respiratory status: spontaneous breathing, nonlabored ventilation, respiratory function stable and patient connected to nasal cannula oxygen Cardiovascular status: stable and blood pressure returned to baseline Postop Assessment: no apparent nausea or vomiting Anesthetic complications: no    Last Vitals:  Vitals:   01/16/19 1136 01/16/19 1527  BP:  (!) 150/90  Pulse:  94  Resp: 18 18  Temp:  (!) 36.4 C  SpO2:  97%    Last Pain:  Vitals:   01/16/19 1558  TempSrc:   PainSc: 3                  Ryan P Ellender

## 2019-01-16 NOTE — Anesthesia Procedure Notes (Signed)
Procedure Name: MAC Date/Time: 01/16/2019 8:25 AM Performed by: Imagene Riches, CRNA Pre-anesthesia Checklist: Patient identified, Emergency Drugs available, Suction available, Patient being monitored and Timeout performed Patient Re-evaluated:Patient Re-evaluated prior to induction Oxygen Delivery Method: Nasal cannula

## 2019-01-16 NOTE — Transfer of Care (Signed)
Immediate Anesthesia Transfer of Care Note  Patient: Patrick Ross  Procedure(s) Performed: Left deep brain stimulator electrode revision. (Left )  Patient Location: PACU  Anesthesia Type:MAC  Level of Consciousness: awake, alert  and oriented  Airway & Oxygen Therapy: Patient Spontanous Breathing and Patient connected to nasal cannula oxygen  Post-op Assessment: Report given to RN and Post -op Vital signs reviewed and stable  Post vital signs: Reviewed and stable  Last Vitals:  Vitals Value Taken Time  BP 115/82 01/16/19 1028  Temp    Pulse 90 01/16/19 1036  Resp 19 01/16/19 1036  SpO2 99 % 01/16/19 1036  Vitals shown include unvalidated device data.  Last Pain:  Vitals:   01/16/19 0637  TempSrc: Oral  PainSc: 0-No pain      Patients Stated Pain Goal: 3 (81/27/51 7001)  Complications: No apparent anesthesia complications

## 2019-01-16 NOTE — Anesthesia Preprocedure Evaluation (Addendum)
Anesthesia Evaluation  Patient identified by MRN, date of birth, ID band Patient awake    Reviewed: Allergy & Precautions, NPO status , Patient's Chart, lab work & pertinent test results  History of Anesthesia Complications (+) PONV and history of anesthetic complications  Airway Mallampati: III  TM Distance: >3 FB Neck ROM: Full    Dental  (+) Missing   Pulmonary sleep apnea and Continuous Positive Airway Pressure Ventilation ,    Pulmonary exam normal breath sounds clear to auscultation       Cardiovascular hypertension, Normal cardiovascular exam Rhythm:Regular Rate:Normal  ECG: NSR, rate 96    Neuro/Psych negative psych ROS   GI/Hepatic Neg liver ROS, GERD  Medicated and Controlled,  Endo/Other  Hypothyroidism   Renal/GU negative Renal ROS     Musculoskeletal Lumbar stenosis   Abdominal   Peds  Hematology negative hematology ROS (+)   Anesthesia Other Findings Parkinson's  Reproductive/Obstetrics                            Anesthesia Physical Anesthesia Plan  ASA: III  Anesthesia Plan: MAC   Post-op Pain Management:    Induction: Intravenous  PONV Risk Score and Plan: 2 and Treatment may vary due to age or medical condition  Airway Management Planned: Nasal Cannula  Additional Equipment:   Intra-op Plan:   Post-operative Plan:   Informed Consent: I have reviewed the patients History and Physical, chart, labs and discussed the procedure including the risks, benefits and alternatives for the proposed anesthesia with the patient or authorized representative who has indicated his/her understanding and acceptance.     Dental advisory given  Plan Discussed with: CRNA  Anesthesia Plan Comments:         Anesthesia Quick Evaluation

## 2019-01-16 NOTE — Brief Op Note (Signed)
01/16/2019  10:25 AM  PATIENT:  Patrick Ross  73 y.o. male  PRE-OPERATIVE DIAGNOSIS:  Parkinson's Disease  POST-OPERATIVE DIAGNOSIS:  Parkinson's Disease  PROCEDURE:  Procedure(s): Left deep brain stimulator electrode revision. (Left)  SURGEON:  Surgeon(s) and Role:    Erline Levine, MD - Primary  PHYSICIAN ASSISTANT:   ASSISTANTS: Poteat, RN   ANESTHESIA:   local and MAC  EBL:  25 mL   BLOOD ADMINISTERED:none  DRAINS: none   LOCAL MEDICATIONS USED:  LIDOCAINE   SPECIMEN:  No Specimen  DISPOSITION OF SPECIMEN:  N/A  COUNTS:  YES  TOURNIQUET:  * No tourniquets in log *  DICTATION: DICTATION:  Indications: Patient is a 73 year old man with Parkinson's Disease. It was elected to take him to surgery for revision of left STN  deep brain stimulator electrode placement  Procedure: Preoperative planing was performed with volumetricCT and placement of 4 fiducial markers in the skull followed by CT of the brain also obtained volumetrically. These were then exported to create Starfix head frame with planned targeting of STN nucleus electrodes was performed. The patient was brought to the operating room and placed in a semi-Fowler's position with his neck and stabilized in a neck holder. This was affixed to the Mayfield adapter. His scalp was then prepped with betadine scrub and DuraPrep and subsequently draped with an Ioban drape.with the fiducials and the skin was infiltrated with local lidocaine.  The areas of planned incision were then infiltrated with local lidocaine with epinephrine.  The left frontal curvilinear incision was reopened and the lead and stimlock cap were exposed.  The cover was removed and the locking gate on the cap was opened.  The lead was withdrawn 7.5 mm.  Subsequently the stimulating electrode was driven by placing the programmer over the IPG and the electrode was found to work well without dyskinesias. The electrode was then locked into position with the  stimlock cap. The wound was then irrigated and closed with 2-0 Vicryl sutures and staples. The fiducials were removed and staples were placed over each of these sites. The head was washed and then sterile occlusive dressings were placed. The patient was taken to recovery having tolerated her procedure without difficulty or untoward effect. Please refer to detailed stimulating electrode recordings from Dr. Carles Collet for more specifics of the positioning of the electrodes. These are included in her Epic note from the detailed neural monitoring and physical exam assessment during the surgery.  PLAN OF CARE: Admit for overnight observation  PATIENT DISPOSITION:  PACU - hemodynamically stable.   Delay start of Pharmacological VTE agent (>24hrs) due to surgical blood loss or risk of bleeding: yes

## 2019-01-16 NOTE — Interval H&P Note (Signed)
History and Physical Interval Note:  01/16/2019 7:32 AM  Patrick Ross  has presented today for surgery, with the diagnosis of Parkinsons.  The various methods of treatment have been discussed with the patient and family. After consideration of risks, benefits and other options for treatment, the patient has consented to  Procedure(s) with comments: Left deep brain stimulator electrode revision (Left) - Left deep brain stimulator electrode revision as a surgical intervention.  The patient's history has been reviewed, patient examined, no change in status, stable for surgery.  I have reviewed the patient's chart and labs.  Questions were answered to the patient's satisfaction.     Peggyann Shoals

## 2019-01-16 NOTE — Progress Notes (Signed)
Patient is awake, alert, conversant.  He is at his baseline.  Doing well.

## 2019-01-17 ENCOUNTER — Telehealth: Payer: Self-pay | Admitting: Neurology

## 2019-01-17 ENCOUNTER — Encounter (HOSPITAL_COMMUNITY): Payer: Self-pay | Admitting: Neurosurgery

## 2019-01-17 DIAGNOSIS — Z4542 Encounter for adjustment and management of neuropacemaker (brain) (peripheral nerve) (spinal cord): Secondary | ICD-10-CM | POA: Diagnosis not present

## 2019-01-17 MED ORDER — CARBIDOPA-LEVODOPA 25-100 MG PO TABS: 1.0000 | ORAL_TABLET | Freq: Every day | ORAL | 1 refills | Status: DC

## 2019-01-17 NOTE — Progress Notes (Signed)
Patient reports he is continuing outpatient PT. No other needs identified at this time.   Patrick Locus Ezana Hubbert LCSW [phone]

## 2019-01-17 NOTE — Care Management Obs Status (Signed)
[facility] NOTIFICATION   Patient Details  Name: Patrick Ross MRN: [id number] Date of Birth: 05/14/1945   Medicare Observation Status Notification Given:  Yes    Benard Halsted, LCSW 01/17/2019, 10:29 AM

## 2019-01-17 NOTE — Telephone Encounter (Signed)
Received a phone call last night from the hospital that after the patient was given his Stalevo, he had fairly significant dyskinesia on the right side.  Told him to hold his Stalevo for the remainder of the night.  Also told him that he could turn off his DBS if he would like, which ultimately he did.  I reviewed postop MRI.  Lead remains about 5 mm anterior and deep to desired position.  Talked with Dr. Vertell Limber.  Battery replacement will be done next week.  At that time, he may pull back a little bit more on the lead.  Call patient/wife this morning.  They were still in the hospital, but had been discharged.  They are awaiting the [employer] rep to come in.  Patient is actually doing fairly well this morning as his DBS is off.  He got 1 levodopa this morning.  Stalevo is on hold.  Told the patient/wife that when he gets home, I want him to continue to hold the Stalevo and instead use all carbidopa/levodopa.  He is currently on about 1100 mg of levodopa per day, between Stalevo and regular IR levodopa.  I will have him take carbidopa/levodopa 25/100, 1 tablet 6-7 times per day.  They are to call me and let me know how they do.  He will continue the amantadine 100 mg 3 times per day.  I will call in a new prescription into his pharmacy for the levodopa.

## 2019-01-17 NOTE — Discharge Summary (Signed)
Physician Discharge Summary  Patient ID: CAESARE ALBAN MRN: [id number] DOB/AGE: 1945-07-31 73 y.o.  Admit date: 01/16/2019 Discharge date: 01/17/2019  Admission Diagnoses:Parkinson's Disease  Discharge Diagnoses: Same Active Problems:   Parkinson's disease [facility])   Discharged Condition: good  Hospital Course: Patient underwent revision of left brain DBS STN lead for PD.  He tolerated procedure well, but had dyskinesias postoperatively, which resolved with device turned off.  Patient doing well in AM of POD 1.  Consults: None  Significant Diagnostic Studies: None  Treatments: surgery: Patient underwent revision of left brain DBS STN lead for PD  Discharge Exam: Blood pressure (!) 150/66, pulse 83, temperature (!) 97.4 F (36.3 C), temperature source Oral, resp. rate 18, height 6\' 2"  (1.88 m), weight 94.3 kg, SpO2 100 %. Neurologic: Alert and oriented X 3, normal strength and tone. Normal symmetric reflexes. Normal coordination and gait Wound:CDI  Disposition: Home   Allergies as of 01/17/2019      Reactions   Other Other (See Comments)   Surgical spray prior to adhesive causes blisters   Sulfamethoxazole Other (See Comments)   Unsure--childhood allergy       Discharge Instructions    Diet - low sodium heart healthy   Complete by: As directed    Increase activity slowly   Complete by: As directed      Allergies as of 01/17/2019      Reactions   Other Other (See Comments)   Surgical spray prior to adhesive causes blisters   Sulfamethoxazole Other (See Comments)   Unsure--childhood allergy      Medication List    STOP taking these medications   ibuprofen 200 MG tablet Commonly known as: ADVIL     TAKE these medications   acetaminophen 500 MG tablet Commonly known as: TYLENOL Take 1,000 mg by mouth 2 (two) times daily.   amantadine 100 MG capsule Commonly known as: SYMMETREL TAKE 1 CAPSULE THREE TIMES A DAY AT 7 A.M., 1 P.M., AND 7 P.M. What  changed:   how much to take  how to take this  when to take this  additional instructions   carbidopa-levodopa 50-200 MG tablet Commonly known as: SINEMET CR Take 1 tablet by mouth at bedtime.   carbidopa-levodopa 25-100 MG tablet Commonly known as: SINEMET IR Take 1 tablet by mouth 3 (three) times daily.   carbidopa-levodopa-entacapone 50-200-200 MG tablet Commonly known as: STALEVO Take 1 tablet by mouth 4 (four) times daily. 7am/11am/3pm/7pm   cephALEXin 250 MG capsule Commonly known as: KEFLEX Take 250 mg by mouth every 8 (eight) hours.   clonazePAM 0.5 MG tablet Commonly known as: KLONOPIN Take 1.5 tablets (0.75 mg total) by mouth at bedtime.   Fortesta 10 MG/ACT (2%) Gel Generic drug: Testosterone Place 6 Pump onto the skin daily. 3 pump on each inner thigh   gabapentin 300 MG capsule Commonly known as: NEURONTIN Take 300 mg by mouth 2 (two) times daily.   HYDROcodone-acetaminophen 5-325 MG tablet Commonly known as: NORCO/VICODIN Take 1 tablet by mouth every 6 (six) hours as needed.   Levothyroxine Sodium 137 MCG Caps Take 137 mcg by mouth daily.   lisinopril 20 MG tablet Commonly known as: ZESTRIL Take 20 mg by mouth daily.   omeprazole 20 MG capsule Commonly known as: PRILOSEC Take 20 mg by mouth daily.   OVER THE COUNTER MEDICATION Take 2 tablets by mouth 2 (two) times daily. Alpha CRS+ otc supplement   OVER THE COUNTER MEDICATION Take 2 capsules by mouth 2 (  two) times daily. Microplex VMZ otc supplement   OVER THE COUNTER MEDICATION Take 2 capsules by mouth 2 (two) times daily. EOmega oil complex   OVER THE COUNTER MEDICATION Take 1 capsule by mouth 2 (two) times daily. Mito53max otc supplement   OVER THE COUNTER MEDICATION Take 1 tablet by mouth daily. onguard otc supplement   OVER THE COUNTER MEDICATION Take 1 capsule by mouth daily with lunch. DDR Prime   OVER THE COUNTER MEDICATION Take 3 capsules by mouth at bedtime. terrazyme  supplement   polyethylene glycol 17 g packet Commonly known as: MIRALAX / GLYCOLAX Take 17 g by mouth daily.   Probiotic Caps Take 1 capsule by mouth at bedtime. PB Assist   TURMERIC PO Take 1 tablet by mouth daily.        Signed: Peggyann Shoals, MD 01/17/2019, 7:42 AM

## 2019-01-17 NOTE — Evaluation (Signed)
Occupational Therapy Evaluation Patient Details Name: Patrick Ross MRN: [id number] DOB: [birth year] Today's Date: 01/17/2019    History of Present Illness Patient is a 73 y/o male with PMH of Parkinson's Disease with Deep Brain Stimulator placed 12/10/2011 right STN DBS and 12/27/2012 left STN DBS with revision 02/02/2013; partial right nephrectomy 2010, lumbar surgeries 1991, 2010, 2013, 2014, 2016, 2018.  Admitted 01/16/19 for Left deep brain stimulator electrode revision.   Clinical Impression   Pt is functioning at a set up to min guard assist level in ADL. He has all necessary equipment at home and care of his wife and daughter. No further OT needs.    Follow Up Recommendations  No OT follow up    Equipment Recommendations  None recommended by OT    Recommendations for Other Services       Precautions / Restrictions Precautions Precautions: Fall Restrictions Weight Bearing Restrictions: No      Mobility Bed Mobility Pt seated in chair, returned to EOB.         Transfers Overall transfer level: Needs assistance Equipment used: None;Rolling walker (2 wheeled) Transfers: Sit to/from Stand Sit to Stand: Min guard         General transfer comment: Required 2 attempts to stand, first trial he sat back down.     Balance Overall balance assessment: Needs assistance Sitting-balance support: Feet supported Sitting balance-Leahy Scale: Fair     Standing balance support: Bilateral upper extremity supported Standing balance-Leahy Scale: Poor Standing balance comment: UE support for balance                           ADL either performed or assessed with clinical judgement   ADL                                         General ADL Comments: Functioning at a set up to min guard level.     Vision Patient Visual Report: No change from baseline       Perception     Praxis      Pertinent Vitals/Pain Pain Assessment:  Faces Faces Pain Scale: Hurts a little bit Pain Location: head Pain Descriptors / Indicators: Aching Pain Intervention(s): Monitored during session;Repositioned     Hand Dominance Right   Extremity/Trunk Assessment Upper Extremity Assessment Upper Extremity Assessment: Overall WFL for tasks assessed   Lower Extremity Assessment Lower Extremity Assessment: Defer to PT evaluation   Cervical / Trunk Assessment Cervical / Trunk Assessment: Kyphotic   Communication Communication Communication: No difficulties   Cognition Arousal/Alertness: Awake/alert Behavior During Therapy: Flat affect Overall Cognitive Status: History of cognitive impairments - at baseline                                 General Comments: baseline memory deficits   General Comments       Exercises     Shoulder Instructions      Home Living Family/patient expects to be discharged to:: Private residence Living Arrangements: Spouse/significant other Available Help at Discharge: Family;Available 24 hours/day Type of Home: House Home Access: Ramped entrance     Home Layout: Two level;Bed/bath upstairs Alternate Level Stairs-Number of Steps: flight Alternate Level Stairs-Rails: Can reach both Bathroom Shower/Tub: Occupational psychologist: Handicapped height  Home Equipment: Shower seat;Grab bars - tub/shower;Walker - 4 wheels;Grab bars - toilet;Other (comment)(walking sticks)          Prior Functioning/Environment Level of Independence: Needs assistance  Gait / Transfers Assistance Needed: walks with one walking stick and furniture in home ADL's / Homemaking Assistance Needed: mod I in ADL, assisted for IADL            OT Problem List:        OT Treatment/Interventions:      OT Goals(Current goals can be found in the care plan section) Acute Rehab OT Goals Patient Stated Goal: to go home  OT Frequency:     Barriers to D/C:            Co-evaluation               AM-PAC OT "6 Clicks" Daily Activity     Outcome Measure Help from another person eating meals?: None Help from another person taking care of personal grooming?: A Little Help from another person toileting, which includes using toliet, bedpan, or urinal?: A Little Help from another person bathing (including washing, rinsing, drying)?: A Little Help from another person to put on and taking off regular upper body clothing?: None Help from another person to put on and taking off regular lower body clothing?: A Little 6 Click Score: 20   End of Session Equipment Utilized During Treatment: Gait belt;Rolling walker  Activity Tolerance: Patient tolerated treatment well Patient left: Other (comment);with family/visitor present(seated at EOB)  OT Visit Diagnosis: Pain;Other abnormalities of gait and mobility (R26.89)                Time: [id number] OT Time Calculation (min): 16 min Charges:  OT General Charges $OT Visit: 1 Visit OT Evaluation $OT Eval Moderate Complexity: 1 Mod  Nestor Lewandowsky, OTR/L Acute Rehabilitation Services Pager: [phone] Office: [phone]  Malka So 01/17/2019, 9:13 AM

## 2019-01-17 NOTE — Progress Notes (Signed)
Physical Therapy Treatment Patient Details Name: Patrick Ross MRN: [id number] DOB: March 01, 1946 Today's Date: 01/17/2019    History of Present Illness Patient is a 73 y/o male with PMH of Parkinson's Disease with Deep Brain Stimulator placed 12/10/2011 right STN DBS and 12/27/2012 left STN DBS with revision 02/02/2013; partial right nephrectomy 2010, lumbar surgeries 1991, 2010, 2013, 2014, 2016, 2018.  Admitted 01/16/19 for Left deep brain stimulator electrode revision.    PT Comments    Pt progressing well with post-op mobility. Was able to perform transfers and ambulation with gross min guard assist and RW for support. Functional limitations near baseline at this time. Pt was educated on precautions, appropriate activity progression, and car transfer. Will continue to follow.     Follow Up Recommendations  Supervision/Assistance - 24 hour(HHPT vs. OPPT - pt active with outpatient prior to surgery )     Equipment Recommendations  None recommended by PT    Recommendations for Other Services       Precautions / Restrictions Precautions Precautions: Fall Restrictions Weight Bearing Restrictions: No    Mobility  Bed Mobility Overal bed mobility: Needs Assistance Bed Mobility: Sidelying to Sit;Rolling Rolling: Supervision Sidelying to sit: Min assist       General bed mobility comments: Min assist to complete transition to full sitting from wife. I do think he could have completed without assistance with increased time.   Transfers Overall transfer level: Needs assistance Equipment used: None;Rolling walker (2 wheeled) Transfers: Sit to/from Stand Sit to Stand: Min guard         General transfer comment: Pt demonstrated good hand placement on seated surface for safety. 1 instance of min assist required however otherwise at a min guard level without RW for transfers. Progressed to supervision with RW.   Ambulation/Gait Ambulation/Gait assistance: Min guard Gait  Distance (Feet): 250 Feet Assistive device: Rolling walker (2 wheeled) Gait Pattern/deviations: Step-through pattern;Steppage Gait velocity: Decreased Gait velocity interpretation: <1.8 ft/sec, indicate of risk for recurrent falls General Gait Details: Pt did well to stop and reset his gait when he got stuck or realized he was taking very small steps. Poor judgement of energy conservation - required wheelchair to be brought up to get back to room as he fatigued and needed urgent seated rest.    Stairs         General stair comments: Pt reports he sits on the bottom stair and boosts himself up to the top on his hips. Wife there to supervise. Pt declines practicing, stating he feels comfortable negotiating stairs this way at home and wife agreed.    Wheelchair Mobility    Modified Rankin (Stroke Patients Only)       Balance Overall balance assessment: Needs assistance Sitting-balance support: Feet supported Sitting balance-Leahy Scale: Fair     Standing balance support: Bilateral upper extremity supported Standing balance-Leahy Scale: Poor Standing balance comment: UE support for dynamic balance                            Cognition Arousal/Alertness: Awake/alert Behavior During Therapy: Flat affect Overall Cognitive Status: History of cognitive impairments - at baseline                                 General Comments: baseline memory deficits      Exercises      General Comments  Pertinent Vitals/Pain Pain Assessment: Faces Faces Pain Scale: Hurts a little bit Pain Location: head Pain Descriptors / Indicators: Aching Pain Intervention(s): Monitored during session;Repositioned    Home Living Family/patient expects to be discharged to:: Private residence Living Arrangements: Spouse/significant other Available Help at Discharge: Family;Available 24 hours/day Type of Home: House Home Access: Ramped entrance   Home Layout: Two  level;Bed/bath upstairs Home Equipment: Shower seat;Grab bars - tub/shower;Walker - 4 wheels;Grab bars - toilet;Other (comment)(walking sticks)      Prior Function Level of Independence: Needs assistance  Gait / Transfers Assistance Needed: walks with one walking stick and furniture in home ADL's / Homemaking Assistance Needed: mod I in ADL, assisted for IADL     PT Goals (current goals can now be found in the care plan section) Acute Rehab PT Goals Patient Stated Goal: to go home PT Goal Formulation: With patient/family Time For Goal Achievement: 01/23/19 Potential to Achieve Goals: Good Progress towards PT goals: Progressing toward goals    Frequency    Min 3X/week      PT Plan Current plan remains appropriate    Co-evaluation              AM-PAC PT "6 Clicks" Mobility   Outcome Measure  Help needed turning from your back to your side while in a flat bed without using bedrails?: None Help needed moving from lying on your back to sitting on the side of a flat bed without using bedrails?: A Little Help needed moving to and from a bed to a chair (including a wheelchair)?: A Little Help needed standing up from a chair using your arms (e.g., wheelchair or bedside chair)?: A Little Help needed to walk in hospital room?: A Little Help needed climbing 3-5 steps with a railing? : A Little 6 Click Score: 19    End of Session   Activity Tolerance: Patient tolerated treatment well Patient left: in chair;with call bell/phone within reach Nurse Communication: Mobility status PT Visit Diagnosis: Other abnormalities of gait and mobility (R26.89);Other symptoms and signs involving the nervous system (R29.898)     Time: [id number] PT Time Calculation (min) (ACUTE ONLY): 30 min  Charges:  $Gait Training: 23-37 mins                     Rolinda Roan, PT, DPT Acute Rehabilitation Services Pager: [phone] Office: [phone]    Thelma Comp 01/17/2019, 10:02  AM

## 2019-01-17 NOTE — Progress Notes (Signed)
Patient is discharged from room 3C09 at this time. Alert and in stable condition. IV site d/c'd and instructions read to patient and spouse with understanding verbalized. Left unit via wheelchair with all belongings at side. 

## 2019-01-17 NOTE — Progress Notes (Signed)
Subjective: Patient reports dyskinesias yesterday, better with device off  Objective: Vital signs in last 24 hours: Temp:  [97 F (36.1 C)-97.8 F (36.6 C)] 97.4 F (36.3 C) (11/18 0449) Pulse Rate:  [83-94] 83 (11/18 0730) Resp:  [18-26] 18 (11/18 0730) BP: (115-179)/(66-109) 150/66 (11/18 0730) SpO2:  [97 %-100 %] 100 % (11/18 0730)  Intake/Output from previous day: 11/17 0701 - 11/18 0700 In: 697 [I.V.:603; IV Piggyback:94] Out: 225 [Urine:200; Blood:25] Intake/Output this shift: No intake/output data recorded.  Physical Exam: At baseline.  Dressings CDI  Lab Results: Recent Labs    01/16/19 0601  WBC 4.5  HGB 15.0  HCT 44.4  PLT 169   BMET Recent Labs    01/16/19 0601  NA 144  K 4.1  CL 109  CO2 26  GLUCOSE 101*  BUN 19  CREATININE 1.08  CALCIUM 9.2    Studies/Results: Mr Brain Wo Contrast  Result Date: 01/16/2019 CLINICAL DATA:  Postop deep brain stimulator revision on the left. Headache. EXAM: MRI HEAD WITHOUT CONTRAST TECHNIQUE: Multiplanar, multiecho pulse sequences of the brain and surrounding structures were obtained without intravenous contrast. COMPARISON:  MRI head 01/01/2019.  CT head 01/09/2019 FINDINGS: Limited sequences were obtained of the brain post revision of the left deep brain stimulator which was withdrawn 7.5 mm. Both leads are now at approximately the same level in the inferior thalamus. No hemorrhage or fluid collection identified. Ventricle size normal. No midline shift. Normal vascular flow voids. Mucosal thickening and retained secretions throughout the left frontal sinus unchanged from prior study. Negative orbit IMPRESSION: Left deep brain stimulator lead withdrawn 7.5 mm. No immediate complication identified. Electronically Signed   By: Franchot Gallo M.D.   On: 01/16/2019 14:49    Assessment/Plan: Rep will come to Hospital today to adjust device before discharge home.  Will restart meds per Dr. Doristine Devoid recommendations.  Plan will  be to replace right chest IPG next week and pull lead back a bit more in OR under anesthesia.    LOS: 0 days    Peggyann Shoals, MD 01/17/2019, 7:37 AM

## 2019-01-18 ENCOUNTER — Telehealth: Payer: Self-pay | Admitting: Neurology

## 2019-01-18 NOTE — Telephone Encounter (Signed)
Pt d/c yesterday from hospital.  I spoke with wife and gave her new dosing instructions on patients meds.  Will you call them early this afternoon and see how he is doing on that new dose with his DBS?

## 2019-01-18 NOTE — Telephone Encounter (Signed)
They are playing around with the DBS.  The night was a little tough but they seem to be getting the hang of it.  The DBS is up to 8 and his leg is starting to swing.  Patient wife wants to know if patient should have ER 50/200 of the sinemet or should she wake him up and give him 25/100.  Spoke with Dr Carles Collet.  She wants patient to continue 1/2 tablets, but does not need to get up at night to take it.  They should try to rely on the DBS more.  Patient wife agreeable.

## 2019-01-19 ENCOUNTER — Telehealth: Payer: Self-pay | Admitting: Neurology

## 2019-01-19 ENCOUNTER — Other Ambulatory Visit (HOSPITAL_COMMUNITY)
Admission: RE | Admit: 2019-01-19 | Discharge: 2019-01-19 | Disposition: A | Discharge status: Home / Self Care | Payer: Medicare Other | Source: Ambulatory Visit | Attending: Neurosurgery | Admitting: Neurosurgery

## 2019-01-19 DIAGNOSIS — Z20828 Contact with and (suspected) exposure to other viral communicable diseases: Secondary | ICD-10-CM | POA: Insufficient documentation

## 2019-01-19 DIAGNOSIS — Z01812 Encounter for preprocedural laboratory examination: Secondary | ICD-10-CM | POA: Insufficient documentation

## 2019-01-19 NOTE — Telephone Encounter (Signed)
Late entry.  Had office call patient's wife yesterday to see how he was doing.  Patients wife had only given him half tablet of levodopa 3 times per day and stated that his voltage was only at 0.8 V, but he was actually walking fairly well.  Offered them a visit today at 11 AM, but he was unable to come because he had Covid testing for his next surgery.  Told the patient's wife to feel free to increase the voltage, if needed.  He was not having dyskinesia in the arm, but it sounded like he was having a little bit in the right leg.  We will plan on pulling back the lead next week.  Patient's wife did just call the office and stated that he had swelling around the eyes.  This was likely just from surgery and fiducial placement, but she was advised to call Dr. Melven Sartorius office, as this is out of my area of expertise.  Expressed understanding.

## 2019-01-21 LAB — NOVEL CORONAVIRUS, NAA (HOSP ORDER, SEND-OUT TO REF LAB; TAT 18-24 HRS): SARS-CoV-2, NAA: NOT DETECTED

## 2019-01-22 ENCOUNTER — Other Ambulatory Visit: Payer: Self-pay

## 2019-01-22 ENCOUNTER — Encounter (HOSPITAL_COMMUNITY): Payer: Self-pay | Admitting: *Deleted

## 2019-01-23 ENCOUNTER — Encounter (HOSPITAL_COMMUNITY): Payer: Self-pay

## 2019-01-23 ENCOUNTER — Ambulatory Visit (HOSPITAL_COMMUNITY): Payer: Medicare Other | Admitting: Anesthesiology

## 2019-01-23 ENCOUNTER — Observation Stay (HOSPITAL_COMMUNITY)
Admission: RE | Admit: 2019-01-23 | Discharge: 2019-01-24 | Disposition: A | Discharge status: Home / Self Care | Payer: Medicare Other | Attending: Neurosurgery | Admitting: Neurosurgery

## 2019-01-23 ENCOUNTER — Other Ambulatory Visit: Payer: Self-pay

## 2019-01-23 ENCOUNTER — Encounter (HOSPITAL_COMMUNITY)
Admission: RE | Disposition: A | Discharge status: Home / Self Care | Payer: Self-pay | Source: Home / Self Care | Attending: Neurosurgery

## 2019-01-23 ENCOUNTER — Observation Stay (HOSPITAL_COMMUNITY): Payer: Medicare Other

## 2019-01-23 DIAGNOSIS — Z882 Allergy status to sulfonamides status: Secondary | ICD-10-CM | POA: Diagnosis not present

## 2019-01-23 DIAGNOSIS — G20 Parkinson's disease: Secondary | ICD-10-CM | POA: Diagnosis not present

## 2019-01-23 DIAGNOSIS — M545 Low back pain: Secondary | ICD-10-CM | POA: Insufficient documentation

## 2019-01-23 DIAGNOSIS — R030 Elevated blood-pressure reading, without diagnosis of hypertension: Secondary | ICD-10-CM | POA: Insufficient documentation

## 2019-01-23 DIAGNOSIS — G4733 Obstructive sleep apnea (adult) (pediatric): Secondary | ICD-10-CM | POA: Diagnosis not present

## 2019-01-23 DIAGNOSIS — E039 Hypothyroidism, unspecified: Secondary | ICD-10-CM | POA: Diagnosis not present

## 2019-01-23 DIAGNOSIS — Z888 Allergy status to other drugs, medicaments and biological substances status: Secondary | ICD-10-CM | POA: Diagnosis not present

## 2019-01-23 DIAGNOSIS — Z79899 Other long term (current) drug therapy: Secondary | ICD-10-CM | POA: Insufficient documentation

## 2019-01-23 DIAGNOSIS — R2689 Other abnormalities of gait and mobility: Secondary | ICD-10-CM | POA: Diagnosis not present

## 2019-01-23 DIAGNOSIS — Z4542 Encounter for adjustment and management of neuropacemaker (brain) (peripheral nerve) (spinal cord): Principal | ICD-10-CM | POA: Insufficient documentation

## 2019-01-23 DIAGNOSIS — Z79890 Hormone replacement therapy (postmenopausal): Secondary | ICD-10-CM | POA: Diagnosis not present

## 2019-01-23 HISTORY — PX: SUBTHALAMIC STIMULATOR INSERTION: SHX5375

## 2019-01-23 HISTORY — PX: PULSE GENERATOR IMPLANT: SHX5370

## 2019-01-23 LAB — TYPE AND SCREEN
ABO/RH(D): A POS
Antibody Screen: NEGATIVE

## 2019-01-23 SURGERY — UNILATERAL PULSE GENERATOR IMPLANT
Anesthesia: General | Site: Head | Laterality: Right

## 2019-01-23 MED ORDER — CEFAZOLIN SODIUM-DEXTROSE 2-4 GM/100ML-% IV SOLN
2.0000 g | INTRAVENOUS | Status: AC
  Administered 2019-01-23: 2 g via INTRAVENOUS

## 2019-01-23 MED ORDER — BACITRACIN ZINC 500 UNIT/GM EX OINT
TOPICAL_OINTMENT | CUTANEOUS | Status: AC
  Filled 2019-01-23: qty 28.35

## 2019-01-23 MED ORDER — LIDOCAINE-EPINEPHRINE 1 %-1:100000 IJ SOLN
INTRAMUSCULAR | Status: DC | PRN
  Administered 2019-01-23: 9.5 mL

## 2019-01-23 MED ORDER — SODIUM CHLORIDE 0.9% FLUSH: 3.0000 mL | INTRAVENOUS | Status: DC | PRN

## 2019-01-23 MED ORDER — CEPHALEXIN 250 MG PO CAPS: 250.0000 mg | ORAL_CAPSULE | Freq: Three times a day (TID) | ORAL | Status: DC

## 2019-01-23 MED ORDER — METHOCARBAMOL 500 MG PO TABS: 500.0000 mg | ORAL_TABLET | Freq: Four times a day (QID) | ORAL | Status: DC | PRN

## 2019-01-23 MED ORDER — PROMETHAZINE HCL 25 MG/ML IJ SOLN: 6.2500 mg | INTRAMUSCULAR | Status: DC | PRN

## 2019-01-23 MED ORDER — THROMBIN 5000 UNITS EX SOLR
CUTANEOUS | Status: AC
  Filled 2019-01-23: qty 5000

## 2019-01-23 MED ORDER — THROMBIN 5000 UNITS EX SOLR
OROMUCOSAL | Status: DC | PRN
  Administered 2019-01-23: 5 mL via TOPICAL

## 2019-01-23 MED ORDER — VANCOMYCIN HCL IN DEXTROSE 1-5 GM/200ML-% IV SOLN
INTRAVENOUS | Status: AC
  Filled 2019-01-23: qty 200

## 2019-01-23 MED ORDER — FENTANYL CITRATE (PF) 250 MCG/5ML IJ SOLN
INTRAMUSCULAR | Status: DC | PRN
  Administered 2019-01-23: 50 ug via INTRAVENOUS
  Administered 2019-01-23: 100 ug via INTRAVENOUS
  Administered 2019-01-23: 50 ug via INTRAVENOUS

## 2019-01-23 MED ORDER — DEXAMETHASONE SODIUM PHOSPHATE 10 MG/ML IJ SOLN
INTRAMUSCULAR | Status: DC | PRN
  Administered 2019-01-23: 5 mg via INTRAVENOUS

## 2019-01-23 MED ORDER — PHENYLEPHRINE 40 MCG/ML (10ML) SYRINGE FOR IV PUSH (FOR BLOOD PRESSURE SUPPORT)
PREFILLED_SYRINGE | INTRAVENOUS | Status: DC | PRN
  Administered 2019-01-23: 80 ug via INTRAVENOUS
  Administered 2019-01-23: 40 ug via INTRAVENOUS
  Administered 2019-01-23: 80 ug via INTRAVENOUS
  Administered 2019-01-23: 40 ug via INTRAVENOUS
  Administered 2019-01-23: 80 ug via INTRAVENOUS

## 2019-01-23 MED ORDER — 0.9 % SODIUM CHLORIDE (POUR BTL) OPTIME
TOPICAL | Status: DC | PRN
  Administered 2019-01-23: 1000 mL

## 2019-01-23 MED ORDER — FENTANYL CITRATE (PF) 100 MCG/2ML IJ SOLN: 25.0000 ug | INTRAMUSCULAR | Status: DC | PRN

## 2019-01-23 MED ORDER — MENTHOL 3 MG MT LOZG: 1.0000 | LOZENGE | OROMUCOSAL | Status: DC | PRN

## 2019-01-23 MED ORDER — ACETAMINOPHEN 500 MG PO TABS
1000.0000 mg | ORAL_TABLET | Freq: Two times a day (BID) | ORAL | Status: DC
  Administered 2019-01-23 – 2019-01-24 (×2): 1000 mg via ORAL
  Filled 2019-01-23 (×2): qty 2

## 2019-01-23 MED ORDER — KCL IN DEXTROSE-NACL 20-5-0.45 MEQ/L-%-% IV SOLN: INTRAVENOUS | Status: DC

## 2019-01-23 MED ORDER — LEVOTHYROXINE SODIUM 137 MCG PO TABS
137.0000 ug | ORAL_TABLET | Freq: Every day | ORAL | Status: DC
  Administered 2019-01-24: 05:00:00 137 ug via ORAL
  Filled 2019-01-23: qty 1

## 2019-01-23 MED ORDER — POLYETHYLENE GLYCOL 3350 17 G PO PACK: 17.0000 g | PACK | Freq: Every day | ORAL | Status: DC | PRN

## 2019-01-23 MED ORDER — LIDOCAINE 2% (20 MG/ML) 5 ML SYRINGE
INTRAMUSCULAR | Status: DC | PRN
  Administered 2019-01-23: 100 mg via INTRAVENOUS

## 2019-01-23 MED ORDER — ZOLPIDEM TARTRATE 5 MG PO TABS: 5.0000 mg | ORAL_TABLET | Freq: Every evening | ORAL | Status: DC | PRN

## 2019-01-23 MED ORDER — METHOCARBAMOL 1000 MG/10ML IJ SOLN
500.0000 mg | Freq: Four times a day (QID) | INTRAVENOUS | Status: DC | PRN
  Filled 2019-01-23: qty 5

## 2019-01-23 MED ORDER — CARBIDOPA-LEVODOPA ER 50-200 MG PO TBCR
1.0000 | EXTENDED_RELEASE_TABLET | Freq: Every day | ORAL | Status: DC
  Administered 2019-01-23: 1 via ORAL
  Filled 2019-01-23: qty 1

## 2019-01-23 MED ORDER — PANTOPRAZOLE SODIUM 40 MG PO TBEC
40.0000 mg | DELAYED_RELEASE_TABLET | Freq: Every day | ORAL | Status: DC
  Administered 2019-01-23: 21:00:00 40 mg via ORAL
  Filled 2019-01-23: qty 1

## 2019-01-23 MED ORDER — VANCOMYCIN HCL 1000 MG IV SOLR
INTRAVENOUS | Status: DC | PRN
  Administered 2019-01-23: 1000 mg via INTRAVENOUS

## 2019-01-23 MED ORDER — MORPHINE SULFATE (PF) 2 MG/ML IV SOLN: 2.0000 mg | INTRAVENOUS | Status: DC | PRN

## 2019-01-23 MED ORDER — CEFAZOLIN SODIUM-DEXTROSE 2-4 GM/100ML-% IV SOLN
2.0000 g | Freq: Three times a day (TID) | INTRAVENOUS | Status: AC
  Administered 2019-01-23 – 2019-01-24 (×2): 2 g via INTRAVENOUS
  Filled 2019-01-23 (×2): qty 100

## 2019-01-23 MED ORDER — DOCUSATE SODIUM 100 MG PO CAPS
100.0000 mg | ORAL_CAPSULE | Freq: Two times a day (BID) | ORAL | Status: DC
  Administered 2019-01-23: 100 mg via ORAL
  Filled 2019-01-23: qty 1

## 2019-01-23 MED ORDER — PROPOFOL 10 MG/ML IV BOLUS
INTRAVENOUS | Status: DC | PRN
  Administered 2019-01-23: 150 mg via INTRAVENOUS
  Administered 2019-01-23: 50 mg via INTRAVENOUS

## 2019-01-23 MED ORDER — TURMERIC 500 MG PO CAPS: ORAL_CAPSULE | Freq: Every day | ORAL | Status: DC

## 2019-01-23 MED ORDER — GABAPENTIN 300 MG PO CAPS
300.0000 mg | ORAL_CAPSULE | Freq: Two times a day (BID) | ORAL | Status: DC
  Administered 2019-01-23 – 2019-01-24 (×2): 300 mg via ORAL
  Filled 2019-01-23 (×2): qty 1

## 2019-01-23 MED ORDER — CARBIDOPA-LEVODOPA 25-100 MG PO TABS
1.0000 | ORAL_TABLET | Freq: Every day | ORAL | Status: DC
  Administered 2019-01-23 – 2019-01-24 (×4): 1 via ORAL
  Filled 2019-01-23 (×6): qty 1

## 2019-01-23 MED ORDER — LIDOCAINE 2% (20 MG/ML) 5 ML SYRINGE
INTRAMUSCULAR | Status: AC
  Filled 2019-01-23: qty 10

## 2019-01-23 MED ORDER — LIDOCAINE-EPINEPHRINE 1 %-1:100000 IJ SOLN
INTRAMUSCULAR | Status: AC
  Filled 2019-01-23: qty 1

## 2019-01-23 MED ORDER — CLONAZEPAM 0.25 MG PO TBDP
0.7500 mg | ORAL_TABLET | Freq: Every day | ORAL | Status: DC
  Administered 2019-01-23: 21:00:00 0.75 mg via ORAL
  Filled 2019-01-23: qty 3

## 2019-01-23 MED ORDER — SUCCINYLCHOLINE CHLORIDE 200 MG/10ML IV SOSY
PREFILLED_SYRINGE | INTRAVENOUS | Status: AC
  Filled 2019-01-23: qty 10

## 2019-01-23 MED ORDER — LISINOPRIL 20 MG PO TABS
20.0000 mg | ORAL_TABLET | Freq: Every day | ORAL | Status: DC
  Administered 2019-01-23: 20 mg via ORAL
  Filled 2019-01-23: qty 1

## 2019-01-23 MED ORDER — CHLORHEXIDINE GLUCONATE CLOTH 2 % EX PADS: 6.0000 | MEDICATED_PAD | Freq: Once | CUTANEOUS | Status: DC

## 2019-01-23 MED ORDER — ACETAMINOPHEN 325 MG PO TABS: 650.0000 mg | ORAL_TABLET | ORAL | Status: DC | PRN

## 2019-01-23 MED ORDER — PANTOPRAZOLE SODIUM 40 MG IV SOLR: 40.0000 mg | Freq: Every day | INTRAVENOUS | Status: DC

## 2019-01-23 MED ORDER — LACTATED RINGERS IV SOLN
INTRAVENOUS | Status: DC
  Administered 2019-01-23: 13:00:00 via INTRAVENOUS

## 2019-01-23 MED ORDER — ACETAMINOPHEN 650 MG RE SUPP: 650.0000 mg | RECTAL | Status: DC | PRN

## 2019-01-23 MED ORDER — VANCOMYCIN HCL 1000 MG IV SOLR
INTRAVENOUS | Status: AC
  Filled 2019-01-23: qty 1000

## 2019-01-23 MED ORDER — PHENYLEPHRINE 40 MCG/ML (10ML) SYRINGE FOR IV PUSH (FOR BLOOD PRESSURE SUPPORT)
PREFILLED_SYRINGE | INTRAVENOUS | Status: AC
  Filled 2019-01-23: qty 10

## 2019-01-23 MED ORDER — ALUM & MAG HYDROXIDE-SIMETH 200-200-20 MG/5ML PO SUSP: 30.0000 mL | Freq: Four times a day (QID) | ORAL | Status: DC | PRN

## 2019-01-23 MED ORDER — ONDANSETRON HCL 4 MG/2ML IJ SOLN: 4.0000 mg | Freq: Four times a day (QID) | INTRAMUSCULAR | Status: DC | PRN

## 2019-01-23 MED ORDER — ROCURONIUM BROMIDE 10 MG/ML (PF) SYRINGE
PREFILLED_SYRINGE | INTRAVENOUS | Status: AC
  Filled 2019-01-23: qty 10

## 2019-01-23 MED ORDER — SODIUM CHLORIDE 0.9% FLUSH
3.0000 mL | Freq: Two times a day (BID) | INTRAVENOUS | Status: DC
  Administered 2019-01-23: 3 mL via INTRAVENOUS

## 2019-01-23 MED ORDER — FLEET ENEMA 7-19 GM/118ML RE ENEM: 1.0000 | ENEMA | Freq: Once | RECTAL | Status: DC | PRN

## 2019-01-23 MED ORDER — BUPIVACAINE HCL 0.5 % IJ SOLN
INTRAMUSCULAR | Status: DC | PRN
  Administered 2019-01-23: 9.5 mL

## 2019-01-23 MED ORDER — AMANTADINE HCL 100 MG PO CAPS
100.0000 mg | ORAL_CAPSULE | Freq: Three times a day (TID) | ORAL | Status: DC
  Administered 2019-01-23 (×2): 100 mg via ORAL
  Filled 2019-01-23 (×3): qty 1

## 2019-01-23 MED ORDER — ONDANSETRON HCL 4 MG PO TABS: 4.0000 mg | ORAL_TABLET | Freq: Four times a day (QID) | ORAL | Status: DC | PRN

## 2019-01-23 MED ORDER — POLYETHYLENE GLYCOL 3350 17 G PO PACK: 17.0000 g | PACK | Freq: Every day | ORAL | Status: DC

## 2019-01-23 MED ORDER — PHENOL 1.4 % MT LIQD: 1.0000 | OROMUCOSAL | Status: DC | PRN

## 2019-01-23 MED ORDER — CEFAZOLIN SODIUM-DEXTROSE 2-4 GM/100ML-% IV SOLN
INTRAVENOUS | Status: AC
  Filled 2019-01-23: qty 100

## 2019-01-23 MED ORDER — ONDANSETRON HCL 4 MG/2ML IJ SOLN
INTRAMUSCULAR | Status: DC | PRN
  Administered 2019-01-23: 4 mg via INTRAVENOUS

## 2019-01-23 MED ORDER — VANCOMYCIN HCL 1000 MG IV SOLR
INTRAVENOUS | Status: DC | PRN
  Administered 2019-01-23: 1000 mg

## 2019-01-23 MED ORDER — HYDROCODONE-ACETAMINOPHEN 5-325 MG PO TABS: 1.0000 | ORAL_TABLET | ORAL | Status: DC | PRN

## 2019-01-23 MED ORDER — SODIUM CHLORIDE 0.9 % IV SOLN: 250.0000 mL | INTRAVENOUS | Status: DC

## 2019-01-23 MED ORDER — EPHEDRINE 5 MG/ML INJ
INTRAVENOUS | Status: AC
  Filled 2019-01-23: qty 10

## 2019-01-23 MED ORDER — PANTOPRAZOLE SODIUM 40 MG PO TBEC: 40.0000 mg | DELAYED_RELEASE_TABLET | Freq: Every day | ORAL | Status: DC

## 2019-01-23 MED ORDER — BACITRACIN ZINC 500 UNIT/GM EX OINT
TOPICAL_OINTMENT | CUTANEOUS | Status: DC | PRN
  Administered 2019-01-23: 1 via TOPICAL

## 2019-01-23 MED ORDER — BISACODYL 10 MG RE SUPP: 10.0000 mg | Freq: Every day | RECTAL | Status: DC | PRN

## 2019-01-23 MED ORDER — RISAQUAD PO CAPS
1.0000 | ORAL_CAPSULE | Freq: Every day | ORAL | Status: DC
  Administered 2019-01-23: 1 via ORAL
  Filled 2019-01-23: qty 1

## 2019-01-23 MED ORDER — HYDROCODONE-ACETAMINOPHEN 5-325 MG PO TABS: 2.0000 | ORAL_TABLET | ORAL | Status: DC | PRN

## 2019-01-23 MED ORDER — FENTANYL CITRATE (PF) 250 MCG/5ML IJ SOLN
INTRAMUSCULAR | Status: AC
  Filled 2019-01-23: qty 5

## 2019-01-23 MED ORDER — BUPIVACAINE HCL (PF) 0.5 % IJ SOLN
INTRAMUSCULAR | Status: AC
  Filled 2019-01-23: qty 30

## 2019-01-23 MED ORDER — OXYCODONE HCL 5 MG PO TABS: 5.0000 mg | ORAL_TABLET | ORAL | Status: DC | PRN

## 2019-01-23 MED ORDER — PROPOFOL 10 MG/ML IV BOLUS
INTRAVENOUS | Status: AC
  Filled 2019-01-23: qty 40

## 2019-01-23 SURGICAL SUPPLY — 76 items
BIT DRILL NEURO 2X3.1 SFT TUCH (MISCELLANEOUS) IMPLANT
BLADE CLIPPER SURG (BLADE) ×4 IMPLANT
BLADE SURG 11 STRL SS (BLADE) IMPLANT
BNDG ADH 1X3 SHEER STRL LF (GAUZE/BANDAGES/DRESSINGS) IMPLANT
BNDG GAUZE ELAST 4 BULKY (GAUZE/BANDAGES/DRESSINGS) IMPLANT
BOOT SUTURE AID YELLOW STND (SUTURE) ×4 IMPLANT
CANISTER SUCT 3000ML PPV (MISCELLANEOUS) ×4 IMPLANT
CARTRIDGE OIL MAESTRO DRILL (MISCELLANEOUS) IMPLANT
CLIP RANEY DISP (INSTRUMENTS) IMPLANT
COVER BACK TABLE 60X90IN (DRAPES) IMPLANT
COVER WAND RF STERILE (DRAPES) ×4 IMPLANT
DECANTER SPIKE VIAL GLASS SM (MISCELLANEOUS) ×4 IMPLANT
DERMABOND ADVANCED (GAUZE/BANDAGES/DRESSINGS) ×2
DERMABOND ADVANCED .7 DNX12 (GAUZE/BANDAGES/DRESSINGS) ×2 IMPLANT
DIFFUSER DRILL AIR PNEUMATIC (MISCELLANEOUS) IMPLANT
DRAPE C-ARM 42X72 X-RAY (DRAPES) IMPLANT
DRAPE CAMERA CLOSED 9X96 (DRAPES) ×4 IMPLANT
DRAPE CAMERA VIDEO/LASER (DRAPES) ×4 IMPLANT
DRAPE LAPAROTOMY 100X72 PEDS (DRAPES) ×4 IMPLANT
DRAPE ORTHO SPLIT 77X108 STRL (DRAPES) ×4
DRAPE POUCH INSTRU U-SHP 10X18 (DRAPES) IMPLANT
DRAPE STERI IOBAN 125X83 (DRAPES) ×4 IMPLANT
DRAPE SURG ORHT 6 SPLT 77X108 (DRAPES) ×4 IMPLANT
DRILL NEURO 2X3.1 SOFT TOUCH (MISCELLANEOUS)
DRSG OPSITE 4X5.5 SM (GAUZE/BANDAGES/DRESSINGS) IMPLANT
DRSG OPSITE POSTOP 4X6 (GAUZE/BANDAGES/DRESSINGS) ×4 IMPLANT
DRSG TEGADERM 2-3/8X2-3/4 SM (GAUZE/BANDAGES/DRESSINGS) ×4 IMPLANT
DRSG TELFA 3X8 NADH (GAUZE/BANDAGES/DRESSINGS) ×4 IMPLANT
DURAPREP 26ML APPLICATOR (WOUND CARE) ×8 IMPLANT
GAUZE 4X4 16PLY RFD (DISPOSABLE) IMPLANT
GAUZE SPONGE 4X4 12PLY STRL (GAUZE/BANDAGES/DRESSINGS) IMPLANT
GLOVE BIO SURGEON STRL SZ8 (GLOVE) ×8 IMPLANT
GLOVE BIOGEL PI IND STRL 8 (GLOVE) ×2 IMPLANT
GLOVE BIOGEL PI IND STRL 8.5 (GLOVE) ×2 IMPLANT
GLOVE BIOGEL PI INDICATOR 8 (GLOVE) ×2
GLOVE BIOGEL PI INDICATOR 8.5 (GLOVE) ×2
GLOVE ECLIPSE 8.0 STRL XLNG CF (GLOVE) ×4 IMPLANT
GLOVE EXAM NITRILE XL STR (GLOVE) IMPLANT
GLOVE INDICATOR 8.0 STRL GRN (GLOVE) ×4 IMPLANT
GLOVE INDICATOR 8.5 STRL (GLOVE) ×4 IMPLANT
GOWN STRL REUS W/ TWL LRG LVL3 (GOWN DISPOSABLE) ×2 IMPLANT
GOWN STRL REUS W/ TWL XL LVL3 (GOWN DISPOSABLE) ×2 IMPLANT
GOWN STRL REUS W/TWL 2XL LVL3 (GOWN DISPOSABLE) ×4 IMPLANT
GOWN STRL REUS W/TWL LRG LVL3 (GOWN DISPOSABLE) ×2
GOWN STRL REUS W/TWL XL LVL3 (GOWN DISPOSABLE) ×2
HEMOSTAT POWDER KIT SURGIFOAM (HEMOSTASIS) ×4 IMPLANT
KIT BASIN OR (CUSTOM PROCEDURE TRAY) ×4 IMPLANT
KIT HEADREST PASSIVE (NEUROSURGERY SUPPLIES) IMPLANT
KIT PLATFORM UNI 4LEG STARFIX (KITS) IMPLANT
KIT REMOVER STAPLE SKIN (MISCELLANEOUS) ×4 IMPLANT
KIT SUTURE REMOVAL HAMOT (SET/KITS/TRAYS/PACK) IMPLANT
KIT TURNOVER KIT B (KITS) ×4 IMPLANT
MARKER SKIN DUAL TIP RULER LAB (MISCELLANEOUS) ×4 IMPLANT
NEEDLE HYPO 25X1 1.5 SAFETY (NEEDLE) ×4 IMPLANT
NEEDLE SPNL 18GX3.5 QUINCKE PK (NEEDLE) IMPLANT
NEEDLE SPNL 22GX3.5 QUINCKE BK (NEEDLE) IMPLANT
NEUROSTIM OCTOPOLAR ~~LOC~~ 60X55 (Neuro Prosthesis/Implant) ×4 IMPLANT
NS IRRIG 1000ML POUR BTL (IV SOLUTION) ×4 IMPLANT
OIL CARTRIDGE MAESTRO DRILL (MISCELLANEOUS)
PACK LAMINECTOMY NEURO (CUSTOM PROCEDURE TRAY) ×4 IMPLANT
PAD ARMBOARD 7.5X6 YLW CONV (MISCELLANEOUS) ×12 IMPLANT
PASSER CATH 36 CODMAN DISP (NEUROSURGERY SUPPLIES) IMPLANT
PERFORATOR LRG  14-11MM (BIT)
PERFORATOR LRG 14-11MM (BIT) IMPLANT
PLATFORM BILATERAL KIT (MISCELLANEOUS) IMPLANT
SPONGE SURGIFOAM ABS GEL SZ50 (HEMOSTASIS) IMPLANT
STAPLER SKIN PROX WIDE 3.9 (STAPLE) ×4 IMPLANT
SUT ETHILON 3 0 PS 1 (SUTURE) IMPLANT
SUT SILK 2 0 TIES 10X30 (SUTURE) ×4 IMPLANT
SUT VIC AB 2-0 CP2 18 (SUTURE) ×4 IMPLANT
SUT VIC AB 2-0 CT2 18 VCP726D (SUTURE) IMPLANT
SUT VIC AB 3-0 SH 8-18 (SUTURE) ×4 IMPLANT
TOWEL GREEN STERILE (TOWEL DISPOSABLE) ×4 IMPLANT
TOWEL GREEN STERILE FF (TOWEL DISPOSABLE) ×4 IMPLANT
TRAY FOLEY MTR SLVR 16FR STAT (SET/KITS/TRAYS/PACK) IMPLANT
WATER STERILE IRR 1000ML POUR (IV SOLUTION) ×4 IMPLANT

## 2019-01-23 NOTE — Op Note (Signed)
01/23/2019  2:48 PM  PATIENT:  Patrick Ross  73 y.o. male  PRE-OPERATIVE DIAGNOSIS:  Parkinson's Disease  POST-OPERATIVE DIAGNOSIS:  Parkinson's Disease  PROCEDURE:  Procedure(s): Right chest implantable pulse generator change (Right) Repositioning of Left Deep Brain Stimulator Electrode (Left)   SURGEON:  Surgeon(s) and Role:    Erline Levine, MD - Primary  PHYSICIAN ASSISTANT:   ASSISTANTS: Poteat, RN   ANESTHESIA:   local and general  EBL:  Minimal  BLOOD ADMINISTERED:none  DRAINS: none   LOCAL MEDICATIONS USED:  MARCAINE    and LIDOCAINE   SPECIMEN:  No Specimen  DISPOSITION OF SPECIMEN:  N/A  COUNTS:  YES  TOURNIQUET:  * No tourniquets in log *  DICTATION: Patient has implanted subthalamic stimulator electrodes and IPG on the right chest, which is now depleted.  It was elected for patient to undergo IPG revision.  Also, the left frontal DBS electrode was felt to be too deep after volumetric postop CT scan, so we elected to pull the electrode back an additional 5 mm.  PROCEDURE: Patient was brought to the operating room and given general anesthesia.  Old staples in scalp were removed and left frontal scalp was prepped with betadine scrub and Duraprep.  Areas of planned incision were infiltrated with lidocaine.  Left frontal scalp incision was reopened and electrode was exposed.  An additional incision was made overlying the electrode, which was carefully dissected from scar tissue.  The cap overlying the stim lock was carefully removed.  The electrode was then withdrawn 5 mm and then locked into position.  The locking cap was replaced.  The wound was irrigated, then closed with 2-0 vicryl sutures and staples.   Right upper chest had also been prepped with betadine scrub and Duraprep and draped.  Area of planned incision was infiltrated with lidocaine.  Prior incision was reopened and the old IPG was externalized.  Adaptor was connected to new IPG which was placed in  the pocket.  Wound was irrigated with bacitracin and with vancomycin. Then irrigated once more.  Incision was closed with 2-0 Vicryl and 3-0 vicryl sutures and dressed with a sterile occlusive dressing.  Counts were correct at the end of the case.  PLAN OF CARE: Admit for overnight observation  PATIENT DISPOSITION:  PACU - hemodynamically stable.   Delay start of Pharmacological VTE agent (>24hrs) due to surgical blood loss or risk of bleeding: yes

## 2019-01-23 NOTE — Brief Op Note (Signed)
01/23/2019  2:48 PM  PATIENT:  Patrick Ross  73 y.o. male  PRE-OPERATIVE DIAGNOSIS:  Parkinson's Disease  POST-OPERATIVE DIAGNOSIS:  Parkinson's Disease  PROCEDURE:  Procedure(s): Right chest implantable pulse generator change (Right) Repositioning of Left Deep Brain Stimulator Electrode (Left)   SURGEON:  Surgeon(s) and Role:    Erline Levine, MD - Primary  PHYSICIAN ASSISTANT:   ASSISTANTS: Poteat, RN   ANESTHESIA:   local and general  EBL:  Minimal  BLOOD ADMINISTERED:none  DRAINS: none   LOCAL MEDICATIONS USED:  MARCAINE    and LIDOCAINE   SPECIMEN:  No Specimen  DISPOSITION OF SPECIMEN:  N/A  COUNTS:  YES  TOURNIQUET:  * No tourniquets in log *  DICTATION: Patient has implanted subthalamic stimulator electrodes and IPG on the right chest, which is now depleted.  It was elected for patient to undergo IPG revision.  Also, the left frontal DBS electrode was felt to be too deep after volumetric postop CT scan, so we elected to pull the electrode back an additional 5 mm.  PROCEDURE: Patient was brought to the operating room and given general anesthesia.  Old staples in scalp were removed and left frontal scalp was prepped with betadine scrub and Duraprep.  Areas of planned incision were infiltrated with lidocaine.  Left frontal scalp incision was reopened and electrode was exposed.  An additional incision was made overlying the electrode, which was carefully dissected from scar tissue.  The cap overlying the stim lock was carefully removed.  The electrode was then withdrawn 5 mm and then locked into position.  The locking cap was replaced.  The wound was irrigated, then closed with 2-0 vicryl sutures and staples.   Right upper chest had also been prepped with betadine scrub and Duraprep and draped.  Area of planned incision was infiltrated with lidocaine.  Prior incision was reopened and the old IPG was externalized.  Adaptor was connected to new IPG which was placed in  the pocket.  Wound was irrigated with bacitracin and with vancomycin. Then irrigated once more.  Incision was closed with 2-0 Vicryl and 3-0 vicryl sutures and dressed with a sterile occlusive dressing.  Counts were correct at the end of the case.  PLAN OF CARE: Admit for overnight observation  PATIENT DISPOSITION:  PACU - hemodynamically stable.   Delay start of Pharmacological VTE agent (>24hrs) due to surgical blood loss or risk of bleeding: yes

## 2019-01-23 NOTE — Progress Notes (Signed)
Patient is awake, alert, conversant.  He is at his baseline.  DBS has been turned on at low settings.

## 2019-01-23 NOTE — Anesthesia Preprocedure Evaluation (Signed)
Anesthesia Evaluation  Patient identified by MRN, date of birth, ID band Patient awake    Reviewed: Allergy & Precautions, NPO status , Patient's Chart, lab work & pertinent test results  History of Anesthesia Complications (+) PONV  Airway Mallampati: II  TM Distance: >3 FB Neck ROM: Full    Dental no notable dental hx.    Pulmonary sleep apnea ,    Pulmonary exam normal breath sounds clear to auscultation       Cardiovascular hypertension, Normal cardiovascular exam Rhythm:Regular Rate:Normal     Neuro/Psych parkinsons dz negative neurological ROS  negative psych ROS   GI/Hepatic negative GI ROS, Neg liver ROS,   Endo/Other  Hypothyroidism   Renal/GU negative Renal ROS  negative genitourinary   Musculoskeletal negative musculoskeletal ROS (+)   Abdominal   Peds negative pediatric ROS (+)  Hematology negative hematology ROS (+)   Anesthesia Other Findings   Reproductive/Obstetrics negative OB ROS                             Anesthesia Physical Anesthesia Plan  ASA: III  Anesthesia Plan: General   Post-op Pain Management:    Induction: Intravenous  PONV Risk Score and Plan: 2 and Ondansetron, Dexamethasone and Treatment may vary due to age or medical condition  Airway Management Planned: Oral ETT  Additional Equipment:   Intra-op Plan:   Post-operative Plan: Extubation in OR  Informed Consent: I have reviewed the patients History and Physical, chart, labs and discussed the procedure including the risks, benefits and alternatives for the proposed anesthesia with the patient or authorized representative who has indicated his/her understanding and acceptance.     Dental advisory given  Plan Discussed with: CRNA and Surgeon  Anesthesia Plan Comments:         Anesthesia Quick Evaluation

## 2019-01-23 NOTE — Transfer of Care (Signed)
Immediate Anesthesia Transfer of Care Note  Patient: Patrick Ross  Procedure(s) Performed: Right chest implantable pulse generator change (Right Chest) Repositioning of Left Deep Brain Stimulator Electrode (Left Head)  Patient Location: PACU  Anesthesia Type:General  Level of Consciousness: drowsy  Airway & Oxygen Therapy: Patient Spontanous Breathing and Patient connected to nasal cannula oxygen  Post-op Assessment: Report given to RN and Post -op Vital signs reviewed and stable  Post vital signs: Reviewed and stable  Last Vitals:  Vitals Value Taken Time  BP 163/96 01/23/19 1453  Temp 36.4 C 01/23/19 1453  Pulse 87 01/23/19 1456  Resp 16 01/23/19 1456  SpO2 100 % 01/23/19 1456  Vitals shown include unvalidated device data.  Last Pain:  Vitals:   01/23/19 1453  TempSrc:   PainSc: (P) 0-No pain      Patients Stated Pain Goal: 2 ([id number])  Complications: No apparent anesthesia complications

## 2019-01-23 NOTE — Progress Notes (Signed)
PHARMACIST - PHYSICIAN ORDER COMMUNICATION  CONCERNING: P&T Medication Policy on Herbal Medications  DESCRIPTION:  This patient's order for: Tumeric caps has been noted.  This product(s) is classified as an "herbal" or natural product. Due to a lack of definitive safety studies or FDA approval, nonstandard manufacturing practices, plus the potential risk of unknown drug-drug interactions while on inpatient medications, the Pharmacy and Therapeutics Committee does not permit the use of "herbal" or natural products of this type within [facility].   ACTION TAKEN: The pharmacy department is unable to verify this order at this time and your patient has been informed of this safety policy. Please reevaluate patient's clinical condition at discharge and address if the herbal or natural product(s) should be resumed at that time.

## 2019-01-23 NOTE — Interval H&P Note (Signed)
History and Physical Interval Note:  01/23/2019 2:41 PM  Patrick Ross  has presented today for surgery, with the diagnosis of Parkinsons.  The various methods of treatment have been discussed with the patient and family. After consideration of risks, benefits and other options for treatment, the patient has consented to  Procedure(s): Right chest implantable pulse generator change (Right) Repositioning of Left Deep Brain Stimulator Electrode (Left) as a surgical intervention.  The patient's history has been reviewed, patient examined, no change in status, stable for surgery.  I have reviewed the patient's chart and labs.  Questions were answered to the patient's satisfaction.     Peggyann Shoals

## 2019-01-23 NOTE — Anesthesia Postprocedure Evaluation (Signed)
Anesthesia Post Note  Patient: Patrick Ross  Procedure(s) Performed: Right chest implantable pulse generator change (Right Chest) Repositioning of Left Deep Brain Stimulator Electrode (Left Head)     Patient location during evaluation: PACU Anesthesia Type: General Level of consciousness: awake and alert Pain management: pain level controlled Vital Signs Assessment: post-procedure vital signs reviewed and stable Respiratory status: spontaneous breathing, nonlabored ventilation, respiratory function stable and patient connected to nasal cannula oxygen Cardiovascular status: blood pressure returned to baseline and stable Postop Assessment: no apparent nausea or vomiting Anesthetic complications: no    Last Vitals:  Vitals:   01/23/19 1453 01/23/19 1508  BP: (!) 163/96 (!) 159/101  Pulse: 87 85  Resp: 14 14  Temp: 36.4 C   SpO2: 100% 100%    Last Pain:  Vitals:   01/23/19 1453  TempSrc:   PainSc: 0-No pain                 Johnluke Haugen S

## 2019-01-23 NOTE — Progress Notes (Signed)
Patrick Ross was seen today in follow up for Parkinsons disease.   Patient had revision surgery on January 16, 2019.  His lead was retracted about 7.5 mm, but the lead was still anterior and too deep, so when he went back for his battery revision, Dr. Vertell Limber retracted the lead another 5 mm.  His medications have been decreased since that time.  Dizziness is better.  Speech is still troublesome.  He is having toe curling every few days rather than every day.  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  No arm dyskinesia.  Pt denies falls.  Pt denies lightheadedness, near syncope.  No hallucinations.  Mood has been good.  Current prescribed movement disorder medications:  carbidopa/levodopa 25/100, , 1/2 at 7am, 1 at 9:30, 1 at 12noon, 1 at 2:30, 1 at 5pm, 1 at 7:30PM (prior 1100 mg of levodopa plus carbidopa/levodopa 50/200 at bed which they have d/c) Amantadine 100 mg tid Klonopin 0.5 mg, 1.5 tablets q hs   ALLERGIES:   Allergies  Allergen Reactions   Other Other (See Comments)    Surgical spray prior to adhesive causes blisters   Sulfamethoxazole Other (See Comments)    Unsure--childhood allergy    CURRENT MEDICATIONS:  Outpatient Encounter Medications as of 01/29/2019  Medication Sig   acetaminophen (TYLENOL) 500 MG tablet Take 1,000 mg by mouth 2 (two) times daily.   amantadine (SYMMETREL) 100 MG capsule TAKE 1 CAPSULE THREE TIMES A DAY AT 7 A.M., 1 P.M., AND 7 P.M. (Patient taking differently: Take 100 mg by mouth 3 (three) times daily. Take at 0700, 1300, and 1900)   carbidopa-levodopa (SINEMET CR) 50-200 MG tablet Take 1 tablet by mouth at bedtime.   carbidopa-levodopa (SINEMET IR) 25-100 MG tablet Take 1 tablet by mouth 6 (six) times daily.   cephALEXin (KEFLEX) 250 MG capsule Take 250 mg by mouth every 8 (eight) hours.   clonazePAM (KLONOPIN) 0.5 MG tablet Take 1.5 tablets (0.75 mg total) by mouth at bedtime.   gabapentin (NEURONTIN) 300 MG capsule Take  300 mg by mouth 2 (two) times daily.    HYDROcodone-acetaminophen (NORCO/VICODIN) 5-325 MG tablet Take 1 tablet by mouth every 6 (six) hours as needed.   Levothyroxine Sodium 137 MCG CAPS Take 137 mcg by mouth daily.    lisinopril (PRINIVIL,ZESTRIL) 20 MG tablet Take 20 mg by mouth daily.    omeprazole (PRILOSEC) 20 MG capsule Take 20 mg by mouth daily.    OVER THE COUNTER MEDICATION Take 2 tablets by mouth 2 (two) times daily. Alpha CRS+ otc supplement   OVER THE COUNTER MEDICATION Take 2 capsules by mouth 2 (two) times daily. Microplex VMZ otc supplement   OVER THE COUNTER MEDICATION Take 2 capsules by mouth 2 (two) times daily. EOmega oil complex   OVER THE COUNTER MEDICATION Take 1 capsule by mouth 2 (two) times daily. Mito76max otc supplement   OVER THE COUNTER MEDICATION Take 1 tablet by mouth daily. onguard otc supplement   OVER THE COUNTER MEDICATION Take 1 capsule by mouth daily with lunch. DDR Prime   OVER THE COUNTER MEDICATION Take 3 capsules by mouth at bedtime. terrazyme supplement   polyethylene glycol (MIRALAX / GLYCOLAX) 17 g packet Take 17 g by mouth daily.   Probiotic CAPS Take 1 capsule by mouth at bedtime. PB Assist   Testosterone (FORTESTA) 10 MG/ACT (2%) GEL Place 6 Pump onto the skin daily. 3 pump on each inner thigh   TURMERIC PO Take  1 tablet by mouth daily.    [EXPIRED] ceFAZolin (ANCEF) IVPB 2g/100 mL premix    [DISCONTINUED] 0.9 %  sodium chloride infusion    [DISCONTINUED] acetaminophen (TYLENOL) suppository 650 mg    [DISCONTINUED] acetaminophen (TYLENOL) tablet 1,000 mg    [DISCONTINUED] acetaminophen (TYLENOL) tablet 650 mg    [DISCONTINUED] acidophilus (RISAQUAD) capsule 1 capsule    [DISCONTINUED] alum & mag hydroxide-simeth (MAALOX/MYLANTA) 200-200-20 MG/5ML suspension 30 mL    [DISCONTINUED] amantadine (SYMMETREL) capsule 100 mg    [DISCONTINUED] bisacodyl (DULCOLAX) suppository 10 mg    [DISCONTINUED] carbidopa-levodopa (SINEMET  CR) 50-200 MG per tablet controlled release 1 tablet    [DISCONTINUED] carbidopa-levodopa (SINEMET IR) 25-100 MG per tablet immediate release 1 tablet    [DISCONTINUED] cephALEXin (KEFLEX) capsule 250 mg    [DISCONTINUED] clonazePAM (KLONOPIN) disintegrating tablet 0.75 mg    [DISCONTINUED] dextrose 5 % and 0.45 % NaCl with KCl 20 mEq/L infusion    [DISCONTINUED] docusate sodium (COLACE) capsule 100 mg    [DISCONTINUED] gabapentin (NEURONTIN) capsule 300 mg    [DISCONTINUED] HYDROcodone-acetaminophen (NORCO/VICODIN) 5-325 MG per tablet 1 tablet    [DISCONTINUED] HYDROcodone-acetaminophen (NORCO/VICODIN) 5-325 MG per tablet 2 tablet    [DISCONTINUED] levothyroxine (SYNTHROID) tablet 137 mcg    [DISCONTINUED] lisinopril (ZESTRIL) tablet 20 mg    [DISCONTINUED] menthol-cetylpyridinium (CEPACOL) lozenge 3 mg    [DISCONTINUED] methocarbamol (ROBAXIN) 500 mg in dextrose 5 % 50 mL IVPB    [DISCONTINUED] methocarbamol (ROBAXIN) tablet 500 mg    [DISCONTINUED] morphine 2 MG/ML injection 2 mg    [DISCONTINUED] ondansetron (ZOFRAN) injection 4 mg    [DISCONTINUED] ondansetron (ZOFRAN) tablet 4 mg    [DISCONTINUED] oxyCODONE (Oxy IR/ROXICODONE) immediate release tablet 5 mg    [DISCONTINUED] pantoprazole (PROTONIX) EC tablet 40 mg    [DISCONTINUED] pantoprazole (PROTONIX) injection 40 mg    [DISCONTINUED] phenol (CHLORASEPTIC) mouth spray 1 spray    [DISCONTINUED] polyethylene glycol (MIRALAX / GLYCOLAX) packet 17 g    [DISCONTINUED] polyethylene glycol (MIRALAX / GLYCOLAX) packet 17 g    [DISCONTINUED] sodium chloride flush (NS) 0.9 % injection 3 mL    [DISCONTINUED] sodium chloride flush (NS) 0.9 % injection 3 mL    [DISCONTINUED] sodium phosphate (FLEET) 7-19 GM/118ML enema 1 enema    [DISCONTINUED] Turmeric CAPS    [DISCONTINUED] zolpidem (AMBIEN) tablet 5 mg    No facility-administered encounter medications on file as of 01/29/2019.     PAST MEDICAL HISTORY:     Past Medical History:  Diagnosis Date   GERD (gastroesophageal reflux disease)    History of kidney stones    passed   Hypertension    Hypothyroidism    Lumbar stenosis    OSA (obstructive sleep apnea)    wears cpap   Parkinson's disease (HCC)    PONV (postoperative nausea and vomiting)    REM behavioral disorder    Urinary incontinence     PAST SURGICAL HISTORY:   Past Surgical History:  Procedure Laterality Date   BACK SURGERY     x 2 - not fusion   COLONOSCOPY     DEEP BRAIN STIMULATOR PLACEMENT     left 12/27/2012, right 12/10/2011, left revision 02/02/2013, replacement bilateral 11/21/2015   MINOR PLACEMENT OF FIDUCIAL N/A 01/09/2019   Procedure: Fiducial placement;  Surgeon: Erline Levine, MD;  Location: [place];  Service: Neurosurgery;  Laterality: N/A;  Fiducial placement   PARTIAL NEPHRECTOMY Right 2005 ish   PULSE GENERATOR IMPLANT Right 01/23/2019   Procedure: Right chest implantable pulse generator  change;  Surgeon: Erline Levine, MD;  Location: [place];  Service: Neurosurgery;  Laterality: Right;   SPINAL FUSION     lumbar Fusion x 2   SUBTHALAMIC STIMULATOR BATTERY REPLACEMENT Left 02/02/2018   Procedure: Change implantable pulse generator battery, Left chest;  Surgeon: Erline Levine, MD;  Location: [place];  Service: Neurosurgery;  Laterality: Left;   SUBTHALAMIC STIMULATOR INSERTION Left 01/16/2019   Procedure: Left deep brain stimulator electrode revision.;  Surgeon: Erline Levine, MD;  Location: [place];  Service: Neurosurgery;  Laterality: Left;   SUBTHALAMIC STIMULATOR INSERTION Left 01/23/2019   Procedure: Repositioning of Left Deep Brain Stimulator Electrode;  Surgeon: Erline Levine, MD;  Location: [place];  Service: Neurosurgery;  Laterality: Left;   TONSILLECTOMY      SOCIAL HISTORY:   Social History   Socioeconomic History   Marital status: Married    Spouse name: Not on file   Number of children: 3   Years of education: Not on file    Highest education level: Master's degree (e.g., MA, MS, MEng, MEd, MSW, MBA)  Occupational History   Not on file  Social Needs   Financial resource strain: Not on file   Food insecurity    Worry: Not on file    Inability: Not on file   Transportation needs    Medical: Not on file    Non-medical: Not on file  Tobacco Use   Smoking status: Never Smoker   Smokeless tobacco: Never Used  Substance and Sexual Activity   Alcohol use: Not Currently   Drug use: Never   Sexual activity: Not on file  Lifestyle   Physical activity    Days per week: Not on file    Minutes per session: Not on file   Stress: Not on file  Relationships   Social connections    Talks on phone: Not on file    Gets together: Not on file    Attends religious service: Not on file    Active member of club or organization: Not on file    Attends meetings of clubs or organizations: Not on file    Relationship status: Not on file   Intimate partner violence    Fear of current or ex partner: Not on file    Emotionally abused: Not on file    Physically abused: Not on file    Forced sexual activity: Not on file  Other Topics Concern   Not on file  Social History Narrative   Not on file    FAMILY HISTORY:   Family Status  Relation Name Status   Mother  Alive   Father  Deceased   Brother  Alive   Child x3 Alive    ROS:  Review of Systems  Constitutional: Positive for malaise/fatigue.  HENT: Negative.   Eyes: Negative.   Respiratory: Negative.   Cardiovascular: Negative.   Gastrointestinal: Negative.   Skin: Negative.   Endo/Heme/Allergies: Negative.     PHYSICAL EXAMINATION:    VITALS:   Vitals:   01/29/19 1008  BP: (!) 168/84  Pulse: 98  Resp: 14  SpO2: 98%  Weight: 209 lb 3.2 oz (94.9 kg)  Height: 6\' 2"  (1.88 m)    GEN:  The patient appears stated age and is in NAD. HEENT:  Normocephalic.  Staples in place and without erythema or drainage The mucous membranes are  moist. The superficial temporal arteries are without ropiness or tenderness. CV:  RRR Lungs:  CTAB Neck/HEME:  There are no  carotid bruits bilaterally. Skin:  Chest incision is well healed.  Neurological examination:  Orientation: The patient is alert and oriented x3. Cranial nerves: There is good facial symmetry with mild facial hypomimia. The speech is fluent and slightly dysarthric and hypophonic. Soft palate rises symmetrically and there is no tongue deviation. Hearing is intact to conversational tone. Sensation: Sensation is intact to light touch throughout Motor: Strength is at least antigravity x4.  Movement examination: Tone: There is normal tone in the UE/LE Abnormal movements: none Coordination:  There is no decremation with RAM's, with any form of RAMS, including alternating supination and pronation of the forearm, hand opening and closing, finger taps, heel taps and toe taps. Gait and Station: The patient pushes off of the chair to arise.  He has a U step walker.  He is forward flexed at the waist.  He drags the left leg with ambulation, but walks fairly well down the hall.  He slightly freezes at the turn.     ASSESSMENT/PLAN:  1. Idiopathic Parkinson's disease, diagnosed in the year 2000 -The patient underwent right STN DBS on December 10, 2011 and a left STN DBS on December 27, 2012. Postoperatively, his left STN DBS lead was found to be displaced and had revision of the left lead on February 02, 2013 (pulled back 1 cm without MER).  IPG on the RIGHT changed on 01/23/19.  IPG on the left last changed on 02/02/18  -Patient underwent lead revision on January 16, 2019 on the left, as the lead was still displaced/deep.  It was pulled back 7.5 mm.  Postoperative imaging indicated that it was still 5 mm too deep, so when the battery was changed to the following week, it was pulled back another 5 mm.  -Patient will remain on carbidopa/levodopa 25/100, but will change his  first morning dose from half tablet to full tablet at 7 AM and then space out dosing to every 3 hours so that he is taking 1 tablet at 7 AM/10 a.m./1 PM/4 PM/7 PM   -Patient will remain off of carbidopa/levodopa 50/200 for now.  Is not noticing any bedtime cramping. -Continue amantadine, 100 mg 3 times per day.  Did discuss with him that if he remains free of dyskinesia, I will likely lower this in the future. 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  -refer to [facility] for physical and speech therapy  -Postoperative video done today.  Shown patient is preop video.  He had marked improvement compared to preop.  -Patient will be following up with Dr. Vertell Limber next week for staple removal  2.REM behavior disorder -Better, although not completely controlled, with clonazepam 0.5 mg, 1.5 tablets nightly.  3. urinary incontinence -Now following withDr. Felipa Eth in [facility].  4.Low back pain -Following with Dr. Letta Pate and [facility] neurosurgery.consideration for Harrells stim has been made  5.  Patient has a follow-up with me both in December and January.  I told him to keep 1 of those appointments, unless he is doing really well and would like to cancel those.  Otherwise, I will plan on seeing him in the next 5 months.  Much greater than 50% of this visit was spent in counseling and coordinating care.  Total face to face time:  40 min, not including the DBS program time described on different procedural sheet  Cc:  Jefm Petty, MD

## 2019-01-24 ENCOUNTER — Encounter (HOSPITAL_COMMUNITY): Payer: Self-pay | Admitting: Neurosurgery

## 2019-01-24 DIAGNOSIS — Z4542 Encounter for adjustment and management of neuropacemaker (brain) (peripheral nerve) (spinal cord): Secondary | ICD-10-CM | POA: Diagnosis not present

## 2019-01-24 MED ORDER — HYDROCODONE-ACETAMINOPHEN 5-325 MG PO TABS: 2.0000 | ORAL_TABLET | ORAL | 0 refills | Status: DC | PRN

## 2019-01-24 NOTE — Progress Notes (Addendum)
Subjective: Patient reports "I am doing okay, better."  Objective: Vital signs in last 24 hours: Temp:  [97.5 F (36.4 C)-98.5 F (36.9 C)] 97.6 F (36.4 C) (11/25 0803) Pulse Rate:  [83-102] 85 (11/25 0803) Resp:  [14-20] 16 (11/25 0803) BP: (127-194)/(71-110) 156/98 (11/25 0803) SpO2:  [99 %-100 %] 100 % (11/25 0803) Weight:  [94.3 kg] 94.3 kg (11/24 1143)  Intake/Output from previous day: 11/24 0701 - 11/25 0700 In: 403 [I.V.:403] Out: 0  Intake/Output this shift: Total I/O In: 360 [P.O.:360] Out: -   Alert and conversant sitting on side of bed eating breakfast.  Reports no pain or discomfort.  Scalp dressing intact without blood.  no visible erythema or swelling.   Right chest dressing intact.  incision without erythema swelling or drainage.   Tremor at baseline but no dyskinetic activity.    Awaits adjustment of deep brain stimulator settings from [employer]  Clinical specialist.  Lab Results: No results for input(s): WBC, HGB, HCT, PLT in the last 72 hours. BMET No results for input(s): NA, K, CL, CO2, GLUCOSE, BUN, CREATININE, CALCIUM in the last 72 hours.  Studies/Results: Ct Dbs Head W/o Contrast  Result Date: 01/23/2019 CLINICAL DATA:  Deep brain stimulator.  Evaluate electrode position EXAM: CT HEAD WITHOUT CONTRAST TECHNIQUE: Contiguous axial images were obtained from the base of the skull through the vertex without intravenous contrast. COMPARISON:  CT head 01/09/2019, MRI 01/16/2019 FINDINGS: Brain: Bilateral deep brain stimulators in place. The electrodes are at approximately the same level of the inferior thalamus bilaterally. Generalized atrophy. Negative for acute infarct, hemorrhage, mass. No fluid collection or midline shift. Vascular: Negative for hyperdense vessel. Mild atherosclerotic calcification in the carotid and vertebral arteries. Skull: Negative Sinuses/Orbits: Mucosal edema paranasal sinuses. Negative orbit. Bilateral ocular surgery. Other: None  IMPRESSION: Deep brain stimulator electrodes are in symmetric position in the inferior thalamus bilaterally. No complication or acute abnormality. Electronically Signed   By: Franchot Gallo M.D.   On: 01/23/2019 16:11    Assessment/Plan:   Improving  LOS: 0 days  Okay to discharge home per Dr. Vertell Limber following  adjustment of deep brain stimulator settings.   Office follow-up in 2 weeks for staple removal and incision checks.   Patient and wife understand to call with questions or concerns.    Verdis Prime 01/24/2019, 8:12 AM   Patient is doing well.  Discharge home.

## 2019-01-24 NOTE — Evaluation (Signed)
Physical Therapy Evaluation Patient Details Name: Patrick Ross MRN: [id number] DOB: 10/07/1945 Today's Date: 01/24/2019   History of Present Illness  Patient is a 73 y/o male s/p left deep brain stimulator repositioning and right chest implantable pulse generator change. PMH including Parkinson's Disease with Deep Brain Stimulator placed 12/10/2011 right STN DBS and 12/27/2012 left STN DBS with revision 02/02/2013; Left deep brain stimulator electrode revision (01/16/19), and partial right nephrectomy 2010, multiple lumbar surgeries.    Clinical Impression  Patient evaluated by Physical Therapy with no further acute PT needs identified. PTA, pt ambulatory with rollator/RW, lives with wife and working with outpatient PT. Today, pt moving well with RW and intermittent min guard for balance. Educ on some strategies for gait mechanics with Parkinson's disease. All education has been completed and the patient has no further questions. Acute PT is signing off; recommend pt continue with OP PT services. Thank you for this referral.    Follow Up Recommendations Outpatient PT;Supervision/Assistance - 24 hour    Equipment Recommendations  None recommended by PT    Recommendations for Other Services       Precautions / Restrictions Precautions Precautions: Fall Restrictions Weight Bearing Restrictions: No      Mobility  Bed Mobility Overal bed mobility: Needs Assistance Bed Mobility: Sidelying to Sit;Rolling;Sit to Supine Rolling: Supervision Sidelying to sit: Supervision   Sit to supine: Supervision   General bed mobility comments: Received sitting EOB  Transfers Overall transfer level: Needs assistance Equipment used: Rolling walker (2 wheeled) Transfers: Sit to/from Stand Sit to Stand: Supervision         General transfer comment: Reliant on momentum to power into standing, good hand placement  Ambulation/Gait Ambulation/Gait assistance: Min guard Gait Distance (Feet):  150 Feet Assistive device: Rolling walker (2 wheeled) Gait Pattern/deviations: Step-through pattern;Steppage Gait velocity: Decreased   General Gait Details: Initially requiring cues for upright posture/relaxed shoulders and step-through gait pattern; pt increasing steppage/shuffling especially when multitasking with conversation, but starting to correct without cues. Steppage when crossing threshold into room and with turns, discussed techniques for this (i.e. counting in head/out loud)  Stairs Stairs: (declined)          Wheelchair Mobility    Modified Rankin (Stroke Patients Only)       Balance Overall balance assessment: Needs assistance Sitting-balance support: Feet supported Sitting balance-Leahy Scale: Fair     Standing balance support: Bilateral upper extremity supported Standing balance-Leahy Scale: Poor Standing balance comment: Reliant on UE support                             Pertinent Vitals/Pain Pain Assessment: Faces Faces Pain Scale: Hurts a little bit Pain Location: head Pain Descriptors / Indicators: Aching Pain Intervention(s): Limited activity within patient's tolerance;Monitored during session    Home Living Family/patient expects to be discharged to:: Private residence Living Arrangements: Spouse/significant other Available Help at Discharge: Family;Available 24 hours/day Type of Home: House Home Access: Ramped entrance     Home Layout: Two level;Bed/bath upstairs Home Equipment: Shower seat;Grab bars - tub/shower;Walker - 4 wheels;Grab bars - toilet;Other (comment)      Prior Function Level of Independence: Needs assistance   Gait / Transfers Assistance Needed: Uses rollator since recent sx.  ADL's / Homemaking Assistance Needed: Performs BADLs with supervision from his wife for balance and safety. Assistance for IADLs        Hand Dominance   Dominant Hand: Right  Extremity/Trunk Assessment   Upper Extremity  Assessment Upper Extremity Assessment: Overall WFL for tasks assessed    Lower Extremity Assessment Lower Extremity Assessment: Generalized weakness    Cervical / Trunk Assessment Cervical / Trunk Assessment: Kyphotic  Communication   Communication: No difficulties  Cognition Arousal/Alertness: Awake/alert Behavior During Therapy: WFL for tasks assessed/performed Overall Cognitive Status: History of cognitive impairments - at baseline                                 General Comments: baseline memory deficits      General Comments General comments (skin integrity, edema, etc.): Wife present and supportive    Exercises Other Exercises Other Exercises: shoulder/cervical stretches   Assessment/Plan    PT Assessment All further PT needs can be met in the next venue of care  PT Problem List Decreased activity tolerance;Decreased cognition;Decreased mobility;Decreased balance;Decreased knowledge of use of DME       PT Treatment Interventions      PT Goals (Current goals can be found in the Care Plan section)  Acute Rehab PT Goals Patient Stated Goal: Go home PT Goal Formulation: All assessment and education complete, DC therapy    Frequency     Barriers to discharge        Co-evaluation               AM-PAC PT "6 Clicks" Mobility  Outcome Measure Help needed turning from your back to your side while in a flat bed without using bedrails?: None Help needed moving from lying on your back to sitting on the side of a flat bed without using bedrails?: None Help needed moving to and from a bed to a chair (including a wheelchair)?: A Little Help needed standing up from a chair using your arms (e.g., wheelchair or bedside chair)?: A Little Help needed to walk in hospital room?: A Little Help needed climbing 3-5 steps with a railing? : A Little 6 Click Score: 20    End of Session   Activity Tolerance: Patient tolerated treatment well Patient left: with  family/visitor present;with call bell/phone within reach(seated EOB) Nurse Communication: Mobility status PT Visit Diagnosis: Other abnormalities of gait and mobility (R26.89);Other symptoms and signs involving the nervous system (Q98.264)    Time: 1583-0940 PT Time Calculation (min) (ACUTE ONLY): 14 min   Charges:   PT Evaluation $PT Eval Moderate Complexity: [place], PT, DPT Acute Rehabilitation Services  Pager [phone] Office [place] 01/24/2019, 9:28 AM

## 2019-01-24 NOTE — Evaluation (Addendum)
Occupational Therapy Evaluation Patient Details Name: Patrick Ross MRN: [id number] DOB: 03-16-1945 Today's Date: 01/24/2019    History of Present Illness Patient is a 73 y/o male s/p left deep brain stimulator repositioning and right chest implantable pulse generator change on 11/24. PMH including Parkinson's Disease with Deep Brain Stimulator placed 12/10/2011 right STN DBS and 12/27/2012 left STN DBS with revision 02/02/2013; Left deep brain stimulator electrode revision (01/16/19), and partial right nephrectomy 2010, multiple lumbar surgeries.   Clinical Impression   PTA, pt was living with his wife who provided supervision for BADLs due to decreased balance and performed IADLs. Currently, pt requires Supervision-Min Guard A for ADLs and functional mobility using RW. Provided education on safety and compensatory techniques for grooming, LB ADLs, toileting, and shower transfer; pt demonstrated and verbalized understanding. Answered all pt questions. Recommend dc home once medically stable per physician. All acute OT needs met and will sign off. Thank you.    Follow Up Recommendations  No OT follow up;Supervision - Intermittent(Would like to resume OP PT)    Equipment Recommendations  None recommended by OT    Recommendations for Other Services PT consult     Precautions / Restrictions Precautions Precautions: Fall Restrictions Weight Bearing Restrictions: No      Mobility Bed Mobility Overal bed mobility: Needs Assistance Bed Mobility: Sidelying to Sit;Rolling;Sit to Supine Rolling: Supervision Sidelying to sit: Supervision   Sit to supine: Supervision   General bed mobility comments: Supervision for safety  Transfers Overall transfer level: Needs assistance Equipment used: None;Rolling walker (2 wheeled) Transfers: Sit to/from Stand Sit to Stand: Min guard         General transfer comment: Min Guard A for safety. Pt performing sit<>stand during LB dressing and  presenting with posterior lean where he sit back down on the EOB as needed. Educating pt on forward weight shift with use of RW for safety    Balance Overall balance assessment: Needs assistance Sitting-balance support: Feet supported Sitting balance-Leahy Scale: Fair     Standing balance support: Bilateral upper extremity supported Standing balance-Leahy Scale: Poor Standing balance comment: posterior lean                           ADL either performed or assessed with clinical judgement   ADL Overall ADL's : Needs assistance/impaired                                       General ADL Comments: Pt performing ADLs and functional mobility at [employer] A. Pt presenting with posterior lean during ADLs. Educating pt on forward weight shift and use of RW for balance and safety. Wife and pt verablized understanding to practice forward weight shifting. Pt able to use figure four method for LB ADLs and educating pt on use of cup for oral care.     Vision Baseline Vision/History: Wears glasses Wears Glasses: At all times Patient Visual Report: No change from baseline       Perception     Praxis      Pertinent Vitals/Pain Pain Assessment: Faces Faces Pain Scale: Hurts a little bit Pain Location: head Pain Descriptors / Indicators: Aching Pain Intervention(s): Limited activity within patient's tolerance;Monitored during session;Repositioned     Hand Dominance Right   Extremity/Trunk Assessment Upper Extremity Assessment Upper Extremity Assessment: Overall WFL for tasks assessed   Lower Extremity  Assessment Lower Extremity Assessment: Defer to PT evaluation   Cervical / Trunk Assessment Cervical / Trunk Assessment: Kyphotic   Communication Communication Communication: No difficulties   Cognition Arousal/Alertness: Awake/alert Behavior During Therapy: WFL for tasks assessed/performed Overall Cognitive Status: History of cognitive  impairments - at baseline                                 General Comments: baseline memory deficits   General Comments  Wife present throughout    Exercises     Shoulder Instructions      Home Living Family/patient expects to be discharged to:: Private residence Living Arrangements: Spouse/significant other Available Help at Discharge: Family;Available 24 hours/day Type of Home: House Home Access: Ramped entrance     Home Layout: Two level;Bed/bath upstairs Alternate Level Stairs-Number of Steps: flight Alternate Level Stairs-Rails: Can reach both Bathroom Shower/Tub: Occupational psychologist: Handicapped height     Home Equipment: Shower seat;Grab bars - tub/shower;Walker - 4 wheels;Grab bars - toilet;Other (comment)(walking sticks)          Prior Functioning/Environment Level of Independence: Needs assistance  Gait / Transfers Assistance Needed: Uses rollator since recent sx. ADL's / Homemaking Assistance Needed: Performs BADLs with supervision from his wife for balance and safety. Assistance for IADLs            OT Problem List:        OT Treatment/Interventions:      OT Goals(Current goals can be found in the care plan section) Acute Rehab OT Goals Patient Stated Goal: Go home OT Goal Formulation: All assessment and education complete, DC therapy  OT Frequency:     Barriers to D/C:            Co-evaluation              AM-PAC OT "6 Clicks" Daily Activity     Outcome Measure Help from another person eating meals?: None Help from another person taking care of personal grooming?: A Little Help from another person toileting, which includes using toliet, bedpan, or urinal?: A Little Help from another person bathing (including washing, rinsing, drying)?: A Little Help from another person to put on and taking off regular upper body clothing?: None Help from another person to put on and taking off regular lower body clothing?:  A Little 6 Click Score: 20   End of Session Equipment Utilized During Treatment: Rolling walker Nurse Communication: Mobility status  Activity Tolerance: Patient tolerated treatment well Patient left: in bed;with call bell/phone within reach;with family/visitor present  OT Visit Diagnosis: Pain;Other abnormalities of gait and mobility (R26.89)                Time: 4268-3419 OT Time Calculation (min): 19 min Charges:  OT General Charges $OT Visit: 1 Visit OT Evaluation $OT Eval Low Complexity: [place], OTR/L Acute Rehab Pager: [phone] Office: [place] 01/24/2019, 8:03 AM

## 2019-01-24 NOTE — Discharge Summary (Signed)
Physician Discharge Summary  Patient ID: Patrick Ross MRN: [id number] DOB/AGE: 03-20-1945 73 y.o.  Admit date: 01/23/2019 Discharge date: 01/24/2019  Admission Diagnoses: Parkinson's Disease    Discharge Diagnoses: Parkinson's Disease s/p Right chest implantable pulse generator change (Right) Repositioning of Left Deep Brain Stimulator Electrode (Left)      Active Problems:   Parkinson's disease [facility])   Discharged Condition: good  Hospital Course:  Patrick Ross was admitted for surgery to replace right chest  Implanted pulse generator for deep brain stimulator  and repositioning  of left deep brain stimulator electrode.   Following uncomplicated surgeries, patient was transferred to [facility] for nursing care.  He is mobilizing well without dyskinesias.  Consults: None  Significant Diagnostic Studies:   Postop head CT confirmed successful left DBS electrode reposition.  Treatments: surgery: Right chest implantable pulse generator change (Right) Repositioning of Left Deep Brain Stimulator Electrode (Left)     Discharge Exam: Blood pressure (!) 156/98, pulse 85, temperature 97.6 F (36.4 C), resp. rate 16, height 6\' 2"  (1.88 m), weight 94.3 kg, SpO2 100 %. Alert and conversant sitting on side of bed eating breakfast. Reports no pain or discomfort. Scalp dressing intact without blood. no visible erythema or swelling. Right chest dressing intact. incision without erythema swelling or drainage. Tremor at baseline but no dyskinetic activity. Awaits adjustment of deep brain stimulator settings from [employer] Clinical specialist.    Disposition:  Discharge home following adjustment of deep brain stimulator settings. Office follow-up in 2 weeks for staple removal and incision checks. Patient and wife understand to call with questions or concerns.     Discharge Instructions    Diet - low sodium heart healthy   Complete by: As directed     Increase activity slowly   Complete by: As directed      Allergies as of 01/24/2019      Reactions   Other Other (See Comments)   Surgical spray prior to adhesive causes blisters   Sulfamethoxazole Other (See Comments)   Unsure--childhood allergy      Medication List    TAKE these medications   acetaminophen 500 MG tablet Commonly known as: TYLENOL Take 1,000 mg by mouth 2 (two) times daily.   amantadine 100 MG capsule Commonly known as: SYMMETREL TAKE 1 CAPSULE THREE TIMES A DAY AT 7 A.M., 1 P.M., AND 7 P.M. What changed:   how much to take  how to take this  when to take this  additional instructions   carbidopa-levodopa 50-200 MG tablet Commonly known as: SINEMET CR Take 1 tablet by mouth at bedtime.   carbidopa-levodopa 25-100 MG tablet Commonly known as: SINEMET IR Take 1 tablet by mouth 6 (six) times daily.   cephALEXin 250 MG capsule Commonly known as: KEFLEX Take 250 mg by mouth every 8 (eight) hours.   clonazePAM 0.5 MG tablet Commonly known as: KLONOPIN Take 1.5 tablets (0.75 mg total) by mouth at bedtime.   Fortesta 10 MG/ACT (2%) Gel Generic drug: Testosterone Place 6 Pump onto the skin daily. 3 pump on each inner thigh   gabapentin 300 MG capsule Commonly known as: NEURONTIN Take 300 mg by mouth 2 (two) times daily.   HYDROcodone-acetaminophen 5-325 MG tablet Commonly known as: NORCO/VICODIN Take 1 tablet by mouth every 6 (six) hours as needed. What changed: Another medication with the same name was added. Make sure you understand how and when to take each.   HYDROcodone-acetaminophen 5-325 MG tablet Commonly known as: NORCO/VICODIN Take 2 tablets  by mouth every 4 (four) hours as needed for severe pain ((score 7 to 10)). What changed: You were already taking a medication with the same name, and this prescription was added. Make sure you understand how and when to take each.   Levothyroxine Sodium 137 MCG Caps Take 137 mcg by mouth  daily.   lisinopril 20 MG tablet Commonly known as: ZESTRIL Take 20 mg by mouth daily.   omeprazole 20 MG capsule Commonly known as: PRILOSEC Take 20 mg by mouth daily.   OVER THE COUNTER MEDICATION Take 2 tablets by mouth 2 (two) times daily. Alpha CRS+ otc supplement   OVER THE COUNTER MEDICATION Take 2 capsules by mouth 2 (two) times daily. Microplex VMZ otc supplement   OVER THE COUNTER MEDICATION Take 2 capsules by mouth 2 (two) times daily. EOmega oil complex   OVER THE COUNTER MEDICATION Take 1 capsule by mouth 2 (two) times daily. Mito49max otc supplement   OVER THE COUNTER MEDICATION Take 1 tablet by mouth daily. onguard otc supplement   OVER THE COUNTER MEDICATION Take 1 capsule by mouth daily with lunch. DDR Prime   OVER THE COUNTER MEDICATION Take 3 capsules by mouth at bedtime. terrazyme supplement   polyethylene glycol 17 g packet Commonly known as: MIRALAX / GLYCOLAX Take 17 g by mouth daily.   Probiotic Caps Take 1 capsule by mouth at bedtime. PB Assist   TURMERIC PO Take 1 tablet by mouth daily.        Signed: Peggyann Shoals, MD 01/24/2019, 8:22 AM

## 2019-01-24 NOTE — Discharge Instructions (Signed)
Wound Care Leave incision open to air. You may shower. Do not scrub directly on incision.  Do not put any creams, lotions, or ointments on incision.  Call Your Doctor If Any of These Occur Redness, drainage, or swelling at the wound.  Temperature greater than 101 degrees. Severe pain not relieved by pain medication. Incision starts to come apart. Follow Up Appt Call today for appointment in 3-4 weeks [phone]) or for problems.  If you have any hardware placed in your spine, you will need an x-ray before your appointment.

## 2019-01-29 ENCOUNTER — Encounter: Payer: Self-pay | Admitting: Neurology

## 2019-01-29 ENCOUNTER — Ambulatory Visit (INDEPENDENT_AMBULATORY_CARE_PROVIDER_SITE_OTHER): Payer: Medicare Other | Admitting: Neurology

## 2019-01-29 ENCOUNTER — Other Ambulatory Visit: Payer: Self-pay

## 2019-01-29 VITALS — BP 168/84 | HR 98 | Resp 14 | Ht 74.0 in | Wt 209.2 lb

## 2019-01-29 DIAGNOSIS — G20 Parkinson's disease: Secondary | ICD-10-CM

## 2019-01-29 DIAGNOSIS — R471 Dysarthria and anarthria: Secondary | ICD-10-CM | POA: Diagnosis not present

## 2019-01-29 NOTE — Procedures (Signed)
DBS Programming was performed.    Total time spent programming was 10 minutes.  Device was confirmed to be on.  Soft start was confirmed to be on.  Impedences were checked and were within normal limits.  Battery was checked and was 2.95 on the left.   Battery on the right was normal at 3.08.  Final settings were as follows: Group A (active) Left brain electrode:    3-C+           ; Amplitude  2.4   V   ; Pulse width 60 microseconds;   Frequency   135   Hz. Right brain electrode:     3-C+          ; Amplitude   3.2  V ;  Pulse width 90  microseconds;  Frequency   135    Hz. Group B: Left brain electrode:     2-C+           ; Amplitude  1.5  V   ; Pulse width 60 microseconds;   Frequency   135   Hz.  Right brain electrode:     3-C+          ; Amplitude   3.2  V ;  Pulse width 90  microseconds;  Frequency   135    Hz.

## 2019-02-19 ENCOUNTER — Encounter: Payer: Medicare Other | Admitting: Neurology

## 2019-03-21 ENCOUNTER — Telehealth: Payer: Self-pay | Admitting: Neurology

## 2019-03-21 NOTE — Telephone Encounter (Signed)
Cecilie Lowers called from [facility] regarding this patient and him having visual changes as well as headaches. He also wants to report patient having an increase in BP around 170/90 the past 2 visits. He wanted Dr. Carles Collet to be aware. Also, he has made Dr. Jeralene Huff aware as well. Thank you

## 2019-03-21 NOTE — Telephone Encounter (Signed)
See below

## 2019-03-21 NOTE — Telephone Encounter (Signed)
Pt has an appointment next week and we will discuss.  Please call patient and make sure that nothing more urgent than next week

## 2019-03-22 NOTE — Procedures (Signed)
DBS Programming was performed.    Manufacturer of DBS device: [employer]onic  Total time spent programming was 5 minutes.  Device was confirmed to be on.  Soft start was confirmed to be on.  Impedences were checked and were within normal limits.  Battery was checked and was determined to be functioning normally and not near the end of life.  Final settings were as follows:   Active Contacts Amplitude (V) PW (ms) Frequency (hz)  Left Brain      Group A(active) 3-C+ 2.4 60 135        Group B 2-C+ 2.0 70 130        Right Brain      Group A (active) 3-C+ 3.2 90 135

## 2019-03-22 NOTE — Progress Notes (Signed)
Patrick Ross was seen today in follow up for Parkinsons disease.  This patient is accompanied in the office by his spouse who supplements the history.My previous records were reviewed prior to todays visit. Doing well since last visit.   Pt had one fall.  It was dark and he missed a step.  He didn't get hurt.  Pt denies lightheadedness, near syncope.  No hallucinations.  Mood has been good.  [facility].  He has been doing well with this, but I just got a call recently from them that the patient has been having high blood pressure and vision changes (blurry, no diplopia).  He has been working with his primary care physician in that regard.  Patient reports today that they are keeping a closer eye on BP - it has been up and down.  He has an eye appt this wed.  Medical records are reviewed since last visit.  I saw his primary care physician on December 22.  At that point time, his blood pressure was good.  He was told to continue his lisinopril.  Current prescribed movement disorder medications: Currently taking carbidopa/levodopa 25/100 1 tablet 6 per day Amantadine 100 mg tid Klonopin, 0.5 mg, 1.5 tabs q hs   ALLERGIES:   Allergies  Allergen Reactions  . Other Other (See Comments)    Surgical spray prior to adhesive causes blisters  . Sulfamethoxazole Other (See Comments)    Unsure--childhood allergy    CURRENT MEDICATIONS:  Outpatient Encounter Medications as of 03/26/2019  Medication Sig  . acetaminophen (TYLENOL) 500 MG tablet Take 1,000 mg by mouth 2 (two) times daily.  Marland Kitchen amantadine (SYMMETREL) 100 MG capsule TAKE 1 CAPSULE THREE TIMES A DAY AT 7 A.M., 1 P.M., AND 7 P.M. (Patient taking differently: Take 100 mg by mouth 3 (three) times daily. Take at 0700, 1300, and 1900)  . carbidopa-levodopa (SINEMET CR) 50-200 MG tablet Take 1 tablet by mouth at bedtime.  . carbidopa-levodopa (SINEMET IR) 25-100 MG tablet Take 1 tablet by  mouth 6 (six) times daily.  . cephALEXin (KEFLEX) 250 MG capsule Take 250 mg by mouth every 8 (eight) hours.  . clonazePAM (KLONOPIN) 0.5 MG tablet Take 1.5 tablets (0.75 mg total) by mouth at bedtime.  . gabapentin (NEURONTIN) 300 MG capsule Take 300 mg by mouth 2 (two) times daily.   . Levothyroxine Sodium 137 MCG CAPS Take 137 mcg by mouth daily.   Marland Kitchen lisinopril (PRINIVIL,ZESTRIL) 20 MG tablet Take 20 mg by mouth daily.   Marland Kitchen omeprazole (PRILOSEC) 20 MG capsule Take 20 mg by mouth daily.   Marland Kitchen OVER THE COUNTER MEDICATION Take 2 tablets by mouth 2 (two) times daily. Alpha CRS+ otc supplement  . OVER THE COUNTER MEDICATION Take 2 capsules by mouth 2 (two) times daily. Microplex VMZ otc supplement  . OVER THE COUNTER MEDICATION Take 2 capsules by mouth 2 (two) times daily. EOmega oil complex  . OVER THE COUNTER MEDICATION Take 1 capsule by mouth 2 (two) times daily. Mito68max otc supplement  . OVER THE COUNTER MEDICATION Take 1 tablet by mouth daily. onguard otc supplement  . OVER THE COUNTER MEDICATION Take 1 capsule by mouth daily with lunch. DDR Prime  . OVER THE COUNTER MEDICATION Take 3 capsules by mouth at bedtime. terrazyme supplement  . polyethylene glycol (MIRALAX / GLYCOLAX) 17 g packet Take 17 g by mouth daily.  . Probiotic CAPS Take 1 capsule by mouth  at bedtime. PB Assist  . Testosterone (FORTESTA) 10 MG/ACT (2%) GEL Place 6 Pump onto the skin daily. 3 pump on each inner thigh  . TURMERIC PO Take 1 tablet by mouth daily.   . [DISCONTINUED] HYDROcodone-acetaminophen (NORCO/VICODIN) 5-325 MG tablet Take 1 tablet by mouth every 6 (six) hours as needed.  . [DISCONTINUED] HYDROcodone-acetaminophen (NORCO/VICODIN) 5-325 MG tablet Take 2 tablets by mouth every 4 (four) hours as needed for severe pain ((score 7 to 10)). (Patient not taking: Reported on 03/26/2019)   No facility-administered encounter medications on file as of 03/26/2019.    PHYSICAL EXAMINATION:    VITALS:   Vitals:    03/26/19 1303  BP: (!) 174/92  Pulse: 89  Resp: 16  SpO2: 97%  Weight: 215 lb (97.5 kg)  Height: 6\' 3"  (1.905 m)    GEN:  The patient appears stated age and is in NAD. HEENT:  Normocephalic, atraumatic.  The mucous membranes are moist. The superficial temporal arteries are without ropiness or tenderness. CV:  RRR Lungs:  CTAB Neck/HEME:  There are no carotid bruits bilaterally.  Neurological examination:  Orientation: The patient is alert and oriented x3. Cranial nerves: There is good facial symmetry with mild facial hypomimia. The speech is fluent and clear. Soft palate rises symmetrically and there is no tongue deviation. Hearing is intact to conversational tone. Sensation: Sensation is intact to light touch throughout Motor: Strength is at least antigravity x4.  Movement examination: Tone: There is normal tone in the UE/LE Abnormal movements: None.  No dyskinesia.  No tremor Coordination:  There is no significant decremation with RAM's, with hand opening and closing or finger taps.  There is just mild slow to taps on the left. Gait and Station: The patient has no difficulty arising out of a deep-seated chair without the use of the hands. The patient's stride length is good with a walker.  He freezes just a little bit in the turn with the right foot.    I have reviewed and interpreted the following labs independently Patient had lab work on February 15, 2019.  Sodium was 142, potassium 4.4, chloride 105, CO2 32, BUN 14, creatinine 0.90, 79 was glucose.  AST 12, ALT 6, TSH was low at 0.328.  ASSESSMENT/PLAN:  1. Idiopathic Parkinson's disease, diagnosed in the year 2000 -The patient underwent right STN DBS on December 10, 2011 and a left STN DBS on December 27, 2012. Postoperatively, his left STN DBS lead was found to be displaced and had revision of the left lead on February 02, 2013 (pulled back 1 cm without MER).  IPG on the RIGHT changed on 01/23/19.  IPG on the  left last changed on 02/02/18             -Patient underwent lead revision on January 16, 2019 on the left, as the lead was still displaced/deep.  It was pulled back 7.5 mm.  Postoperative imaging indicated that it was still 5 mm too deep, so when the battery was changed to the following week, it was pulled back another 5 mm.  -Over the next 3 weeks, we will wean off of amantadine.  I do not think he needs it any longer for dyskinesia, and it may be contributing to vision change.  It could also be contributing to any memory issues, although he is actually doing fairly well.             -In 4 weeks, the patient will drop 1 tablet of levodopa so  that he will be taking carbidopa/levodopa 25/100, 1 tablet at 7 AM/10 a.m./1 PM/4 PM/7 PM              -Patient will remain off of carbidopa/levodopa 50/200 for now.  Is not noticing any bedtime cramping. -Continue amantadine, 100 mg 3 times per day.  Did discuss with him that if he remains free of dyskinesia, I will likely lower this in the future. 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             -[facility].  Is doing well with that.            2.REM behavior disorder -Better, although not completely controlled, with clonazepam 0.5 mg, 1.5 tablets nightly.  3. urinary incontinence -Now following withDr. Felipa Eth in [facility].  4.Low back pain -Following with Dr. Letta Pate and [facility] neurosurgery.consideration for Guinda stim has been made  5.  htn  -f/u with PCP  -monitor bp at home  Total time spent on today's visit was 30 min, including both face-to-face time and nonface-to-face time.  Time included that spent on review of records (prior notes available to me/labs/imaging if pertinent), discussing treatment and goals, answering patient's questions and  coordinating care.  Cc:  Jefm Petty, MD

## 2019-03-26 ENCOUNTER — Ambulatory Visit (INDEPENDENT_AMBULATORY_CARE_PROVIDER_SITE_OTHER): Payer: Medicare Other | Admitting: Neurology

## 2019-03-26 ENCOUNTER — Other Ambulatory Visit: Payer: Self-pay

## 2019-03-26 ENCOUNTER — Encounter: Payer: Self-pay | Admitting: Neurology

## 2019-03-26 VITALS — BP 174/92 | HR 89 | Resp 16 | Ht 75.0 in | Wt 215.0 lb

## 2019-03-26 DIAGNOSIS — G20 Parkinson's disease: Secondary | ICD-10-CM | POA: Diagnosis not present

## 2019-03-26 DIAGNOSIS — G4752 REM sleep behavior disorder: Secondary | ICD-10-CM | POA: Diagnosis not present

## 2019-03-26 DIAGNOSIS — H539 Unspecified visual disturbance: Secondary | ICD-10-CM

## 2019-03-26 DIAGNOSIS — I10 Essential (primary) hypertension: Secondary | ICD-10-CM

## 2019-03-26 NOTE — Patient Instructions (Addendum)
1.  Week 1:  Decrease amantadine to 100 mg twice per day 2.  Week 2 :  Decrease amantadine to 100 mg once per day 3.  Week 3:  Stop amantadine 4.  Week 4:  Take carbidopa/levodopa 25/100,  1 tablet at 7 AM/10 a.m./1 PM/4 PM/7 PM   The physicians and staff at [facility] are committed to providing excellent care. You may receive a survey requesting feedback about your experience at our office. We strive to receive "very good" responses to the survey questions. If you feel that your experience would prevent you from giving the office a "very good " response, please contact our office to try to remedy the situation. We may be reached at [phone]. Thank you for taking the time out of your busy day to complete the survey.

## 2019-04-11 ENCOUNTER — Telehealth: Payer: Self-pay | Admitting: Neurology

## 2019-04-11 NOTE — Telephone Encounter (Signed)
10 day supply sent to [facility] on [address]

## 2019-04-11 NOTE — Telephone Encounter (Signed)
PDMP reviewed.  Looks like last filled on 11/18 for 90 days.  That should get him to when he can fill [facility], although may need new RX for that?

## 2019-04-11 NOTE — Telephone Encounter (Signed)
Patient's wife called regarding his 0.5 MG Clonazepam medication. He uses [facility] and will not be processed until 04/13/19. He would like a prescription sent to [facility] on [address]. Thank you

## 2019-04-11 NOTE — Telephone Encounter (Signed)
Please advise on below  

## 2019-04-30 NOTE — Progress Notes (Addendum)
Patrick Ross was seen today in follow up for Parkinsons disease.  Patient seen as a work in today.  Wife reports that patient just has not been doing well and they emailed me about that and I worked him in today.  His amantadine was stopped not long ago (was a 3-week wean after our last visit on January 25).  Wife and patient both feel that he started not doing well very acutely several weeks ago, but that it also has been getting worse.  He has been having confusion.  He is having trouble using his phone.  This is very unusual for him.  My previous records were reviewed prior to todays visit as well as outside records available to me. Pt [facility].  Those records have been reviewed.  Patient was also seen in urgent care on February 22 for frequent urination.  He was started on Myrbetriq but apparently insurance did not pay for that, and was given a prescription for tolterodine but they have not started that.  A urine culture was done that was negative.  I personally reviewed and interpreted that.  Patient has had no fevers or chills.  He has not had the Covid vaccine.  Current prescribed movement disorder medications: Carbidopa/levodopa 25/100, 1 tablet at 7 AM/10 AM/1 PM/4 PM/7 PM Clonazepam 0.5 mg, 1.5 tablets at bedtime    ALLERGIES:   Allergies  Allergen Reactions  . Other Other (See Comments)    Surgical spray prior to adhesive causes blisters  . Sulfamethoxazole Other (See Comments)    Unsure--childhood allergy    CURRENT MEDICATIONS:  Outpatient Encounter Medications as of 05/01/2019  Medication Sig  . acetaminophen (TYLENOL) 500 MG tablet Take 1,000 mg by mouth 2 (two) times daily.  . carbidopa-levodopa (SINEMET IR) 25-100 MG tablet Take 1 tablet by mouth 6 (six) times daily.  . clonazePAM (KLONOPIN) 0.5 MG tablet Take 1.5 tablets (0.75 mg total) by mouth at bedtime.  . gabapentin (NEURONTIN) 300 MG capsule Take 300 mg by mouth 2 (two)  times daily.   . Levothyroxine Sodium 137 MCG CAPS Take 137 mcg by mouth daily.   Marland Kitchen lisinopril (PRINIVIL,ZESTRIL) 20 MG tablet Take 20 mg by mouth daily.   Marland Kitchen omeprazole (PRILOSEC) 20 MG capsule Take 20 mg by mouth daily.   Marland Kitchen OVER THE COUNTER MEDICATION Take 2 tablets by mouth 2 (two) times daily. Alpha CRS+ otc supplement  . OVER THE COUNTER MEDICATION Take 2 capsules by mouth 2 (two) times daily. Microplex VMZ otc supplement  . OVER THE COUNTER MEDICATION Take 2 capsules by mouth 2 (two) times daily. EOmega oil complex  . OVER THE COUNTER MEDICATION Take 1 capsule by mouth 2 (two) times daily. Mito81max otc supplement  . OVER THE COUNTER MEDICATION Take 1 tablet by mouth daily. onguard otc supplement  . OVER THE COUNTER MEDICATION Take 1 capsule by mouth daily with lunch. DDR Prime  . OVER THE COUNTER MEDICATION Take 3 capsules by mouth at bedtime. terrazyme supplement  . polyethylene glycol (MIRALAX / GLYCOLAX) 17 g packet Take 17 g by mouth daily.  . Probiotic CAPS Take 1 capsule by mouth at bedtime. PB Assist  . Testosterone (FORTESTA) 10 MG/ACT (2%) GEL Place 6 Pump onto the skin daily. 3 pump on each inner thigh  . tolterodine (DETROL LA) 4 MG 24 hr capsule Take 1 capsule by mouth daily.  . TURMERIC PO Take 1 tablet by mouth daily.   . [  DISCONTINUED] amantadine (SYMMETREL) 100 MG capsule TAKE 1 CAPSULE THREE TIMES A DAY AT 7 A.M., 1 P.M., AND 7 P.M. (Patient not taking: Reported on 05/01/2019)  . [DISCONTINUED] carbidopa-levodopa (SINEMET CR) 50-200 MG tablet Take 1 tablet by mouth at bedtime.  . [DISCONTINUED] cephALEXin (KEFLEX) 250 MG capsule Take 250 mg by mouth every 8 (eight) hours.   No facility-administered encounter medications on file as of 05/01/2019.    PHYSICAL EXAMINATION:    VITALS:   Vitals:   05/01/19 0841  BP: (!) 180/99  Pulse: 92  SpO2: 100%  Weight: 214 lb (97.1 kg)  Height: 6\' 3"  (1.905 m)    GEN:  The patient appears stated age and is in NAD. HEENT:   Normocephalic, atraumatic.  The mucous membranes are moist. The superficial temporal arteries are without ropiness or tenderness. CV:  RRR Lungs:  CTAB Neck/HEME:  There are no carotid bruits bilaterally.  Neurological examination:  Orientation: The patient is alert and oriented x3.  However, he is very slow to answer and looks to his wife for more sensitive aspects of the history (very unusual for him). Cranial nerves: There is good facial symmetry with significant facial hypomimia. The speech is fluent and clear. Soft palate rises symmetrically and there is no tongue deviation. Hearing is intact to conversational tone. Sensation: Sensation is intact to light touch throughout Motor: Strength is 5/5 in the bilateral upper and RLE.  Strength is 4/5 in the LLE  Movement examination: Tone: There is normal tone in the upper and lower extremities. Abnormal movements: None seen Coordination:  There is no decremation with RAM's, but he is very slow Gait and Station: The patient pushes off of the chair to arise.  He has significant start hesitation.  He is very slow, even with a walker.  This is very unusual.   ASSESSMENT/PLAN:  1.  Parkinsons Disease  -The patient underwent right STN DBS on December 10, 2011 and a left STN DBS on December 27, 2012. Postoperatively, his left STN DBS lead was found to be displaced and had revision of the left lead on February 02, 2013 (pulled back 1 cm without MER). IPGon the RIGHTchanged on 01/23/19. IPG on the left last changed on 02/02/18 -Patient underwent lead revision on January 16, 2019 on the left, as the lead was still displaced/deep. It was pulled back 7.5 mm. Postoperative imaging indicated that it was still 5 mm toodeep, so when the battery was changed to the following week, it was pulled back another 5 mm.  -I stopped his amantadine last visit, and wife thought he got weaker because of that.  I would be surprised if it was that.  Patient  is exhibiting confusion and some lateralizing weakness.  I am very concerned that perhaps he has had infection or postoperative bleed.  We are going to do a stat CT now.  I asked the patient not to leave the building until they got the results.  -If his stat CT is normal, then we will go ahead and proceed with an EEG.  That may or may not be done today.  -If the above is normal, we can go ahead and adjust his levodopa or amantadine, but I just do not think that is the primary issue.  Addendum:  Reviewed CT and normal.  Discussed doing MRI and they will hold on that until they do EEG and restart amantadine at 100 mg bid.  I asked them to keep close in contact with me re:  status update.  2.  RBD  -Continue clonazepam, 0.5 mg, 1.5 tablets at night  3.  Urinary incontinence  -Follows with Dr. Felipa Eth in [facility]  4.  Low back pain  -Follows with Dr. Letta Pate as well as [facility] neurosurgery   Total time spent on today's visit was 45 minutes, including both face-to-face time and nonface-to-face time.  Time included that spent on review of records (prior notes available to me/labs/imaging if pertinent), discussing treatment and goals, answering patient's questions and coordinating care.  Cc:  Jefm Petty, MD

## 2019-05-01 ENCOUNTER — Encounter: Payer: Self-pay | Admitting: Neurology

## 2019-05-01 ENCOUNTER — Telehealth: Payer: Self-pay | Admitting: Neurology

## 2019-05-01 ENCOUNTER — Ambulatory Visit
Admission: RE | Admit: 2019-05-01 | Discharge: 2019-05-01 | Disposition: A | Discharge status: Home / Self Care | Payer: Medicare Other | Source: Ambulatory Visit | Attending: Neurology | Admitting: Neurology

## 2019-05-01 ENCOUNTER — Ambulatory Visit (INDEPENDENT_AMBULATORY_CARE_PROVIDER_SITE_OTHER): Payer: Medicare Other | Admitting: Neurology

## 2019-05-01 ENCOUNTER — Telehealth: Payer: Self-pay | Admitting: *Deleted

## 2019-05-01 ENCOUNTER — Other Ambulatory Visit: Payer: Self-pay

## 2019-05-01 DIAGNOSIS — G9340 Encephalopathy, unspecified: Secondary | ICD-10-CM

## 2019-05-01 DIAGNOSIS — G20 Parkinson's disease: Secondary | ICD-10-CM | POA: Diagnosis not present

## 2019-05-01 NOTE — Addendum Note (Signed)
Addended by: Alonza Bogus S on: 05/01/2019 12:45 PM   Modules accepted: Orders

## 2019-05-01 NOTE — Procedures (Signed)
DBS Programming was performed.    Manufacturer of DBS device: Medtronic  Total time spent programming was 5 minutes.  Device was confirmed to be on.  Soft start was confirmed to be on.  Impedences were checked and were within normal limits.  Battery was checked and was determined to be functioning normally and not near the end of life.  Final settings were as follows:   Active Contacts Amplitude (V) PW (ms) Frequency (hz) Battery  Left Brain       Group A(active) 3-C+ 2.4 60 135   05/01/19 3-C+ 2.4 60 135 2.94  Group B 2-C+ 2.0 70 130   Same as prior       Right Brain       Group A (active) 3-C+ 3.2 90 135   05/01/19 3-C+ 3.2 90 135 2.96

## 2019-05-01 NOTE — Telephone Encounter (Signed)
Cecilie Lowers from [facility] called and left a message to give a verbal update on the patient.

## 2019-05-01 NOTE — Telephone Encounter (Signed)
LMOM for the available dates for EEG this week and asked that he call us back and leave a message if any of these work for him. I told him he may call us tomorrow morning for the morning appointment it will be fine. The following dates and times: Tomorrow Wednesday 05/02/2019 at 10 am or 2:30 pm And Friday 05/04/2019 9 am

## 2019-05-02 ENCOUNTER — Ambulatory Visit (INDEPENDENT_AMBULATORY_CARE_PROVIDER_SITE_OTHER): Payer: Medicare Other | Admitting: Neurology

## 2019-05-02 DIAGNOSIS — R9401 Abnormal electroencephalogram [EEG]: Secondary | ICD-10-CM | POA: Diagnosis not present

## 2019-05-02 DIAGNOSIS — G9340 Encephalopathy, unspecified: Secondary | ICD-10-CM

## 2019-05-02 NOTE — Telephone Encounter (Signed)
Left message for Cecilie Lowers, stating that I was returning his call to give him a verbal for therapy. Advised him to contact office with any questions or concerns.

## 2019-05-04 ENCOUNTER — Telehealth: Payer: Self-pay | Admitting: Neurology

## 2019-05-04 DIAGNOSIS — G9340 Encephalopathy, unspecified: Secondary | ICD-10-CM

## 2019-05-04 NOTE — Progress Notes (Signed)
TECHNICAL SUMMARY:  A multichannel referential and bipolar montage EEG using the standard international 10-20 system was performed on the patient described as awake and drowsy.  The dominant background activity consists of 7 hertz activity seen most prominantly over the posterior head region.  The backgound activity is minimally to eye opening and closing procedures.  Low voltage fast (beta) activity is distributed symmetrically and maximally over the anterior head regions.  ACTIVATION:  Stepwise photic stimulation at 4-20 flashes per second was performed and did not elicit any abnormal waveforms.  Hyperventilation was not performed.  EPILEPTIFORM ACTIVITY:  There were no spikes, sharp waves or paroxysmal activity.  SLEEP: Physiologic drowsiness is noted, but no stage II sleep.   IMPRESSION:  This is an abnormal EEG demonstrating a mild diffuse slowing of electrocerebral activity.  This can be seen in a wide variety of encephalopathic state including those of a toxic, metabolic, or degenerative nature.  There were no focal, hemispheric, or lateralizing features.  No epileptiform activity was recorded.

## 2019-05-04 NOTE — Telephone Encounter (Signed)
Please call patient/wife Patrick Ross and let them know that while EEG was a tad slow, there were no seizure waves on there.  I want to order MRI brain (I know that she will tell you last time that they had to go to [facility] for that but lumbar spines are different and we can do brain MRI's on DBS patients here).  Please order MRI brain with post op DBS protocol, 1.5 T scanner ONLY.  Pt has a DBS in place.  I may have to help you put this order in.  It has to be done at [facility] and the Med[employer] rep, cindy , will need to be there (her number in the order please - I gave that to you).  This is a noncontrast MRI.  Dx:  Encephalopathy.  Please ask wife also to make sure that they get an appt with PCP and Dr. Vertell Limber to make sure that nothing else going on.  So far, the device itself looks fine so I want others to be looking for causes.  How did he do with medication change the other day?

## 2019-05-04 NOTE — Telephone Encounter (Signed)
Order placed.  Patients wife notified and voiced understanding.

## 2019-05-17 ENCOUNTER — Telehealth: Payer: Self-pay | Admitting: Neurology

## 2019-05-17 NOTE — Telephone Encounter (Signed)
Patrick Ross, could you call the hospital radiology and find out why he has not yet been scheduled for his MRI.  It has been weeks since I sent the referral.  I did send an email to the MRI supervisor and copied you on it

## 2019-05-18 NOTE — Telephone Encounter (Signed)
Patrick Ross from [facility] returned the call and said the patient will need to call their facility for scheduling the MRI, they do not call patient's for scheduling.   Please notify patient to call and schedule the appointment at: [phone].

## 2019-05-18 NOTE — Telephone Encounter (Signed)
Spoke with Aniceto Boss and she let me know that once the test is approve our office is supposed to contact them to schedule the appt. Info noted.   MRI has been scheduled for 05/21/2019 at [place] is aware and patient is aware and thanked me for calling.

## 2019-05-21 ENCOUNTER — Other Ambulatory Visit: Payer: Self-pay

## 2019-05-21 ENCOUNTER — Telehealth: Payer: Self-pay | Admitting: Neurology

## 2019-05-21 ENCOUNTER — Ambulatory Visit (HOSPITAL_COMMUNITY)
Admission: RE | Admit: 2019-05-21 | Discharge: 2019-05-21 | Disposition: A | Discharge status: Home / Self Care | Payer: Medicare Other | Source: Ambulatory Visit | Attending: Neurology | Admitting: Neurology

## 2019-05-21 DIAGNOSIS — G9340 Encephalopathy, unspecified: Secondary | ICD-10-CM | POA: Insufficient documentation

## 2019-05-21 NOTE — Telephone Encounter (Signed)
Please let pt and wife know that I reviewed MRI done today and it looked good.  If patient is still having trouble with confusion and walking, needs to f/u with PCP to make sure nothing medically going on with infection, UTI, etc

## 2019-05-22 NOTE — Telephone Encounter (Signed)
Left detailed message on patients voicemail with Dr Arturo Morton results and advised patient to contact the office with any questions or concerns.

## 2019-06-26 ENCOUNTER — Encounter: Payer: Medicare Other | Admitting: Neurology

## 2019-07-12 ENCOUNTER — Other Ambulatory Visit: Payer: Self-pay | Admitting: Neurology

## 2019-07-13 ENCOUNTER — Other Ambulatory Visit: Payer: Self-pay | Admitting: Neurology

## 2019-07-13 MED ORDER — CLONAZEPAM 0.5 MG PO TABS: ORAL_TABLET | ORAL | 0 refills | Status: DC

## 2019-07-13 NOTE — Telephone Encounter (Signed)
Rx(s) sent to pharmacy electronically.  

## 2019-08-23 NOTE — Progress Notes (Signed)
Assessment/Plan:   1.  Parkinsons Disease             -The patient underwent right STN DBS on December 10, 2011 and a left STN DBS on December 27, 2012. Postoperatively, his left STN DBS lead was found to be displaced and had revision of the left lead on February 02, 2013 (pulled back 1 cm without MER). IPGon the RIGHTchanged on 01/23/19. IPG on the left last changed on 02/02/18 -Patient underwent lead revision on January 16, 2019 on the left, as the lead was still displaced/deep. It was pulled back 7.5 mm. Postoperative imaging indicated that it was still 5 mm toodeep, so when the battery was changed to the following week, it was pulled back another 5 mm.  Postoperative imaging indicates that the leads are now in good place.  Interestingly, wife reports back pain better since repositioning surgery  -Continue carbidopa/levodopa 25/100, 1 tablet at 7am/10am/1pm/4pm/7pm  -Continue amantadine, 100 mg twice per day  -Start trial of Inbrija.  Patient shown how to use this.  Can use up to 5 times per day separated by 2 hours each.  2.  RBD             -Continue clonazepam, 0.5 mg, 1.5 tablets at night  3.  Urinary incontinence             -huge QOL issue and patient asked about that and sexual dysfunction.  Told him to make f/u with Dr. Felipa Eth in [facility] (urology)  4.  Low back pain             -Follows with Dr. Letta Pate as well as [facility] neurosurgery  Subjective:   [name] was seen today in follow up for Parkinsons disease.  My previous records were reviewed prior to todays visit as well as outside records available to me. Pt really did not look well to me last time.  He ended up having an EEG that just demonstrated mild slowing.  He had a CT of the brain that was nonacute.  He ended up having an MRI of the brain that was personally reviewed that was also nonacute and just showed his DBS tips the STN.  We subsequently referred [facility] to therapy [facility].  I ended up getting some video from them on him that he was improving remarkably. Pt reports PCP started him on lexapro, 10 mg, in May.  Fell 3 weeks ago going into room - carrying food and not using walker.  Asks about freezing - no regular pattern.  Asks about bladder and sexual dysfunction.  Not been to see neurology in about a year.  Wife does note that back pain is much better than prior to repositioning surgery.  Current prescribed movement disorder medications: Carbidopa/levodopa 25/100, 1 tablet at 7 AM/10 AM/1 PM/4 PM/7 PM Clonazepam 0.5 mg, 1-1/2 tablets at bedtime Amantadine, 100 mg twice per day (started last visit again)    ALLERGIES:   Allergies  Allergen Reactions  . Other Other (See Comments)    Surgical spray prior to adhesive causes blisters  . Sulfamethoxazole Other (See Comments)    Unsure--childhood allergy    CURRENT MEDICATIONS:  Outpatient Encounter Medications as of 08/27/2019  Medication Sig  . acetaminophen (TYLENOL) 500 MG tablet Take 1,000 mg by mouth 2 (two) times daily.  . carbidopa-levodopa (SINEMET IR) 25-100 MG tablet TAKE 1 TABLET THREE TIMES A DAY (Patient taking differently: Take 1 tablet by mouth  5 (five) times daily. )  . clonazePAM (KLONOPIN) 0.5 MG tablet 1.5 tabs at bed  . escitalopram (LEXAPRO) 10 MG tablet Take 10 mg by mouth at bedtime.  . gabapentin (NEURONTIN) 300 MG capsule Take 300 mg by mouth 2 (two) times daily.   Marland Kitchen levothyroxine (SYNTHROID) 125 MCG tablet Take 125 mcg by mouth daily.   Marland Kitchen lisinopril (PRINIVIL,ZESTRIL) 20 MG tablet Take 20 mg by mouth daily.   Marland Kitchen omeprazole (PRILOSEC) 20 MG capsule Take 20 mg by mouth daily.   Marland Kitchen OVER THE COUNTER MEDICATION Take 2 tablets by mouth 2 (two) times daily. Alpha CRS+ otc supplement  . OVER THE COUNTER MEDICATION Take 2 capsules by mouth 2 (two) times daily. Microplex VMZ otc supplement  . OVER THE COUNTER MEDICATION Take 2 capsules by mouth 2 (two) times daily. EOmega oil complex   . OVER THE COUNTER MEDICATION Take 1 capsule by mouth 2 (two) times daily. Mito80max otc supplement  . OVER THE COUNTER MEDICATION Take 1 tablet by mouth daily. onguard otc supplement  . OVER THE COUNTER MEDICATION Take 1 capsule by mouth daily with lunch. DDR Prime  . OVER THE COUNTER MEDICATION Take 3 capsules by mouth at bedtime. terrazyme supplement  . polyethylene glycol (MIRALAX / GLYCOLAX) 17 g packet Take 17 g by mouth daily.  . Probiotic CAPS Take 1 capsule by mouth at bedtime. PB Assist  . Testosterone (FORTESTA) 10 MG/ACT (2%) GEL Place 6 Pump onto the skin daily. 3 pump on each inner thigh  . TURMERIC PO Take 1 tablet by mouth daily.    No facility-administered encounter medications on file as of 08/27/2019.    Objective:   PHYSICAL EXAMINATION:    VITALS:   Vitals:   08/27/19 1425  BP: (!) 153/86  Pulse: 83  SpO2: 95%  Weight: 225 lb (102.1 kg)  Height: 6\' 3"  (1.905 m)    GEN:  The patient appears stated age and is in NAD. HEENT:  Normocephalic, atraumatic.  The mucous membranes are moist. The superficial temporal arteries are without ropiness or tenderness. CV:  RRR Lungs:  CTAB Neck/HEME:  There are no carotid bruits bilaterally.  Neurological examination:  Orientation: The patient is alert and oriented x3. Cranial nerves: There is good facial symmetry with facial hypomimia. The speech is fluent and clear. Soft palate rises symmetrically and there is no tongue deviation. Hearing is intact to conversational tone. Sensation: Sensation is intact to light touch throughout Motor: Strength is at least antigravity x4.  Movement examination: Tone: There is normal tone in the UE/LE Abnormal movements: none Coordination:  There is  decremation with RAM's, with any form of RAMS, including alternating supination and pronation of the forearm, hand opening and closing, finger taps, heel taps and toe taps on the right Gait and Station: The patient has  difficulty arising  out of a deep-seated chair without the use of the hands. He pushes off to arise.  There is start hesitation.  He has a U step walker.  He has some freezing, especially with the right leg.  I have reviewed and interpreted the following labs independently    Chemistry      Component Value Date/Time   NA 144 01/16/2019 0601   K 4.1 01/16/2019 0601   CL 109 01/16/2019 0601   CO2 26 01/16/2019 0601   BUN 19 01/16/2019 0601   CREATININE 1.08 01/16/2019 0601      Component Value Date/Time   CALCIUM 9.2 01/16/2019 0601  ALKPHOS 75 04/12/2008 0524   AST 32 04/12/2008 0524   ALT 27 04/12/2008 0524   BILITOT 1.2 04/12/2008 0524       Lab Results  Component Value Date   WBC 4.5 01/16/2019   HGB 15.0 01/16/2019   HCT 44.4 01/16/2019   MCV 97.2 01/16/2019   PLT 169 01/16/2019    No results found for: TSH  DBS programming was performed today which is described in more detail on a separate programming procedure note.  In brief, rapid alternating movements were markedly better after reprogramming.  Total time spent on today's visit was 45 minutes, including both face-to-face time and nonface-to-face time.  Time included that spent on review of records (prior notes available to me/labs/imaging if pertinent), discussing treatment and goals, answering patient's questions and coordinating care.  This did not include DBS programming time, described in more detail on separate programming procedural note.  Cc:  Jefm Petty, MD

## 2019-08-27 ENCOUNTER — Ambulatory Visit (INDEPENDENT_AMBULATORY_CARE_PROVIDER_SITE_OTHER): Payer: Medicare Other | Admitting: Neurology

## 2019-08-27 ENCOUNTER — Other Ambulatory Visit: Payer: Self-pay

## 2019-08-27 ENCOUNTER — Encounter: Payer: Self-pay | Admitting: Neurology

## 2019-08-27 VITALS — BP 153/86 | HR 83 | Ht 75.0 in | Wt 225.0 lb

## 2019-08-27 DIAGNOSIS — G20 Parkinson's disease: Secondary | ICD-10-CM | POA: Diagnosis not present

## 2019-08-27 DIAGNOSIS — R37 Sexual dysfunction, unspecified: Secondary | ICD-10-CM | POA: Diagnosis not present

## 2019-08-27 DIAGNOSIS — R32 Unspecified urinary incontinence: Secondary | ICD-10-CM

## 2019-08-27 NOTE — Procedures (Signed)
DBS Programming was performed.    Manufacturer of DBS device: Medtronic  Total time spent programming was 30 minutes.  Device was confirmed to be on.  Soft start was confirmed to be on.  Impedences were checked and were within normal limits.  Battery was checked and was determined to be functioning normally and not near the end of life.  Final settings were as follows:   Active Contacts Amplitude (V) PW (ms) Frequency (hz) Battery  Left Brain       Group A(active) 3-C+ 2.4 60 135   05/01/19 3-C+ 2.4 60 135 2.94  08/27/19 3-C+ 3.0 (max 3.4-min 0.00) 60 135 2.92                       Group B 2-C+ 2.0 70 130          Right Brain       Group A (active) 3-C+ 3.2 90 135   05/01/19 3-C+ 3.2 90 135 2.96  08/27/19 3-C+ 3.2 90 135 2.95

## 2019-08-27 NOTE — Patient Instructions (Addendum)
1.  Start inbrija - 2 capsules is one dosage.  Take a sip of water before using it.  You can inhale it up to 5 times per day, separated by at least 2 hours.  Let us know if we need to send away a RX for this  2.  Make an appointment with Dr. Felipa Eth  The physicians and staff at [facility] are committed to providing excellent care. You may receive a survey requesting feedback about your experience at our office. We strive to receive "very good" responses to the survey questions. If you feel that your experience would prevent you from giving the office a "very good " response, please contact our office to try to remedy the situation. We may be reached at [phone]. Thank you for taking the time out of your busy day to complete the survey.

## 2019-11-29 ENCOUNTER — Telehealth: Payer: Self-pay | Admitting: Neurology

## 2019-11-29 MED ORDER — CLONAZEPAM 0.5 MG PO TABS: ORAL_TABLET | ORAL | 1 refills | Status: DC

## 2019-11-29 NOTE — Telephone Encounter (Signed)
Patient needs to have [place] send in a new RX for Carbidopa levidopa and the clonazepam to [facility]

## 2019-11-29 NOTE — Telephone Encounter (Signed)
Reviewed pt chart [facility] sent in 06/2019 270 tabs with 2 refills, Clonzepam 0.5 mg tabs sent in as well with 0 refills.   Please advise

## 2019-12-12 ENCOUNTER — Telehealth: Payer: Self-pay | Admitting: Neurology

## 2019-12-12 ENCOUNTER — Other Ambulatory Visit: Payer: Self-pay

## 2019-12-12 ENCOUNTER — Ambulatory Visit
Admission: RE | Admit: 2019-12-12 | Discharge: 2019-12-12 | Disposition: A | Discharge status: Home / Self Care | Payer: Medicare Other | Source: Ambulatory Visit | Attending: Neurology | Admitting: Neurology

## 2019-12-12 DIAGNOSIS — G20 Parkinson's disease: Secondary | ICD-10-CM

## 2019-12-12 NOTE — Telephone Encounter (Signed)
I contacted [place], pt is on the way for a walk in CT with no contrast of head. Patient advised and verbally understood direcitons where to go.

## 2019-12-12 NOTE — Telephone Encounter (Signed)
Received a message from Dr. Melven Sartorius nurse that patient saw them after a fall and he was told to contact our office.  I am out of the office the remainder of the week.  Patient apparently sustained a laceration over the left lead anchor/cap.  Fall was 2 days ago.  Laceration was healing.  Apparently, right side was weaker and there was gait changes.  They did not feel that the patient needed a CT brain, but they were concerned that the hit to the head caused change in the DBS.  I am not concerned about that, as the DBS can certainly take great trauma.  However, I do think that he needs a CT brain without contrast.  Christy, please order that stat today.  Dx:  R sided weakness, fall.  Hinton Dyer, please put him in NP slot next week after I am back for me to look at the DBS but don't close the message until [place] orders the CT

## 2019-12-12 NOTE — Telephone Encounter (Signed)
Patient's wife called office to inform Dr Tat of the patient's fall. Since Dr Tat has already been made aware of the situation, I scheduled patient for an appointment with Dr Tat 12/20/19, per Dr Doristine Devoid instructions below.

## 2019-12-12 NOTE — Telephone Encounter (Signed)
Patient notified CT ordered with out contrast. Stat sent to [facility]

## 2019-12-13 NOTE — Telephone Encounter (Signed)
CT results given to patient and he voiced understanding.

## 2019-12-13 NOTE — Telephone Encounter (Signed)
Patient returned call from nurse about results and left a message to call him back.

## 2019-12-17 NOTE — Progress Notes (Signed)
Assessment/Plan:   1.  Parkinsons Disease             -The patient underwent right STN DBS on December 10, 2011 and a left STN DBS on December 27, 2012. Postoperatively, his left STN DBS lead was found to be displaced and had revision of the left lead on February 02, 2013 (pulled back 1 cm without MER). IPGon the RIGHTchanged on 01/23/19. IPG on the left last changed on 02/02/18 -Patient underwent lead revision on January 16, 2019 on the left, as the lead was still displaced/deep. It was pulled back 7.5 mm. Postoperative imaging indicated that it was still 5 mm toodeep, so when the battery was changed to the following week, it was pulled back another 5 mm.  Postoperative imaging indicates that the leads are now in good place.  Interestingly, wife reports back pain better since repositioning surgery  -Reassured patient that DBS programming looks good after he hit his head recently on the wall. His head CT likewise was unremarkable.  -Continue carbidopa/levodopa 25/100, 1 tablet at 7am/10am/1pm/4pm/7pm  -Continue amantadine, 100 mg twice per day  -Discussed Inbrija and how to use it in detail.  They do think it is helping.  Samples provided and a prescription has been started.  -RX PT/OT/ST for [facility].  His speech sounds much better than in the past  -Discussed how to take care of the scalp over the lead.  2.  RBD             -Continue clonazepam, 0.5 mg, 1.5 tablets at night  3.  Urinary incontinence             -Following with Dr. Felipa Eth in [facility] (urology), who started him on Myrbetriq in September, 2021.  4.  Low back pain             -Follows with Dr. Letta Pate as well as [facility] neurosurgery  5.  Depression  -They asked about counseling.  I think this is a good idea.  We gave him information to [facility], but he thinks he is going to explore counseling through a pastor first.  Subjective:   Patrick Ross was seen today.  I  was contacted by Dr. Melven Sartorius nurse that they were concerned about his DBS because he had some weakness on the right side after the fall and he had a laceration over the DBS scalp electrode.  I was somewhat concerned about bleeding, so did go ahead and get a stat CT, which was negative.  He subsequently was asked to follow-up here today. It turns out that he had actually not been seen at Dr. Melven Sartorius office until yesterday. I got the notes from yesterday. Patient actually did not fall, but he tripped and hit his head on the corner of a wall, causing a laceration over the electrode. Patient felt that tremor was worse after that and Dr. Vertell Limber was concerned that there was something wrong with the programming. Patient was just seen by Dr. Vertell Limber yesterday. My previous records were reviewed prior to todays visit as well as outside records available to me.  Wife reports that patient actually did not notice any symptoms for about a week, and then he felt he was not walking quite as well.  She did not notice that initially, but he has been using his walker more and was previously using his walking sticks.  Pt has been working with [facility].  I have personally  talked with them and he really made good progress with them.  He did see Dr. Felipa Eth, urology, since our last visit.  He and I had discussed this at our last visit, as his urinary incontinence was becoming a very large quality of life issue.  He last saw Dr. Felipa Eth on September 15.  I reviewed those records.  He was started on Myrbetriq  Current prescribed movement disorder medications: Carbidopa/levodopa 25/100, 1 tablet at 7 AM/10 AM/1 PM/4 PM/7 PM Clonazepam 0.5 mg, 1-1/2 tablets at bedtime Amantadine, 100 mg twice per day  Inbrija (started last visit)   ALLERGIES:   Allergies  Allergen Reactions  . Other Other (See Comments)    Surgical spray prior to adhesive causes blisters  . Sulfamethoxazole Other (See Comments)    Unsure--childhood  allergy    CURRENT MEDICATIONS:  Outpatient Encounter Medications as of 12/20/2019  Medication Sig  . amantadine (SYMMETREL) 100 MG capsule Take 100 mg by mouth 2 (two) times daily.  . carbidopa-levodopa (SINEMET IR) 25-100 MG tablet Take 1 tablet by mouth 5 (five) times daily.  . clonazePAM (KLONOPIN) 0.5 MG tablet 1.5 tabs at bed  . escitalopram (LEXAPRO) 10 MG tablet Take 10 mg by mouth at bedtime.  Marland Kitchen levothyroxine (SYNTHROID) 125 MCG tablet Take 125 mcg by mouth daily.   Marland Kitchen lisinopril (PRINIVIL,ZESTRIL) 20 MG tablet Take 20 mg by mouth daily.   Marland Kitchen omeprazole (PRILOSEC) 20 MG capsule Take 20 mg by mouth daily.   Marland Kitchen OVER THE COUNTER MEDICATION Take 2 tablets by mouth 2 (two) times daily. Alpha CRS+ otc supplement  . OVER THE COUNTER MEDICATION Take 2 capsules by mouth 2 (two) times daily. Microplex VMZ otc supplement  . OVER THE COUNTER MEDICATION Take 2 capsules by mouth 2 (two) times daily. EOmega oil complex  . OVER THE COUNTER MEDICATION Take 1 capsule by mouth 2 (two) times daily. Mito39max otc supplement  . OVER THE COUNTER MEDICATION Take 1 tablet by mouth daily. onguard otc supplement  . OVER THE COUNTER MEDICATION Take 1 capsule by mouth daily with lunch. DDR Prime  . OVER THE COUNTER MEDICATION Take 3 capsules by mouth at bedtime. terrazyme supplement  . Probiotic CAPS Take 1 capsule by mouth at bedtime. PB Assist  . Testosterone (FORTESTA) 10 MG/ACT (2%) GEL Place 6 Pump onto the skin daily. 3 pump on each inner thigh  . TURMERIC PO Take 1 tablet by mouth daily.   Marland Kitchen acetaminophen (TYLENOL) 500 MG tablet Take 1,000 mg by mouth 2 (two) times daily. (Patient not taking: Reported on 12/20/2019)  . polyethylene glycol (MIRALAX / GLYCOLAX) 17 g packet Take 17 g by mouth daily. (Patient not taking: Reported on 12/20/2019)  . [DISCONTINUED] carbidopa-levodopa (SINEMET IR) 25-100 MG tablet TAKE 1 TABLET THREE TIMES A DAY (Patient taking differently: Take 1 tablet by mouth 5 (five) times  daily. )  . [DISCONTINUED] gabapentin (NEURONTIN) 300 MG capsule Take 300 mg by mouth 2 (two) times daily.  (Patient not taking: Reported on 12/20/2019)   No facility-administered encounter medications on file as of 12/20/2019.    Objective:   PHYSICAL EXAMINATION:    VITALS:   Vitals:   12/20/19 0844  BP: 133/70  Pulse: 95  SpO2: 98%  Weight: 226 lb (102.5 kg)  Height: 6\' 1"  (1.854 m)    GEN:  The patient appears stated age and is in NAD. HEENT:  Normocephalic, atraumatic.  The mucous membranes are moist. The superficial temporal arteries are without ropiness or tenderness.  CV:  RRR Lungs:  CTAB Neck/HEME:  There are no carotid bruits bilaterally.  Neurological examination:  Orientation: The patient is alert and oriented x3. Cranial nerves: There is good facial symmetry with facial hypomimia. The speech is fluent and much less hypophonic.  He is less dysarthric. Soft palate rises symmetrically and there is no tongue deviation. Hearing is intact to conversational tone. Sensation: Sensation is intact to light touch throughout Motor: Strength is at least antigravity x4.  Movement examination: Tone: There is normal tone in the UE/LE Abnormal movements: none Coordination:  There is  Just minor decremation of foot taps on the right and finger taps on the right Gait and Station: The patient has  difficulty arising out of a deep-seated chair without the use of the hands. He pushes off to arise.  There is start hesitation.  He has a U step walker.  He has some freezing, especially with the right leg (same as last visit)  I have reviewed and interpreted the following labs independently Patient had lab work on November 23, 2019.  Sodium was 142, potassium 4.4, chloride 106, CO2 28, BUN 19, creatinine 0.91, glucose 87, AST 15, ALT 6, white blood cells 4.2, hemoglobin 15.8, hematocrit 47.0, platelets 162  DBS programming was performed today which is described in more detail on a  separate programming procedure note.  In brief, rapid alternating movements were markedly better after reprogramming.  Total time spent on today's visit was 30 minutes, including both face-to-face time and nonface-to-face time.  Time included that spent on review of records (prior notes available to me/labs/imaging if pertinent), discussing treatment and goals, answering patient's questions and coordinating care.  This did not include DBS programming time, described in more detail on separate programming procedural note.  Cc:  Jefm Petty, MD

## 2019-12-20 ENCOUNTER — Encounter: Payer: Self-pay | Admitting: Neurology

## 2019-12-20 ENCOUNTER — Ambulatory Visit (INDEPENDENT_AMBULATORY_CARE_PROVIDER_SITE_OTHER): Payer: Medicare Other | Admitting: Neurology

## 2019-12-20 ENCOUNTER — Other Ambulatory Visit: Payer: Self-pay

## 2019-12-20 VITALS — BP 133/70 | HR 95 | Ht 73.0 in | Wt 226.0 lb

## 2019-12-20 DIAGNOSIS — G20 Parkinson's disease: Secondary | ICD-10-CM

## 2019-12-20 MED ORDER — INBRIJA 42 MG IN CAPS: 2.0000 | ORAL_CAPSULE | Freq: Every day | RESPIRATORY_TRACT | 0 refills | Status: DC | PRN

## 2019-12-20 NOTE — Procedures (Signed)
DBS Programming was performed.    Manufacturer of DBS device: Medtronic  Total time spent programming was 15 minutes.  Device was confirmed to be on.  Soft start was confirmed to be on.  Impedences were checked and were within normal limits.  Battery was checked and was determined to be functioning normally and not near the end of life.  Final settings were as follows with group A active:   Active Contact Amplitude (V) PW (ms) Frequency (hz) Side Effects Battery  Left Brain        Group A        12/20/19 3-C+ 3.1 70 140  2.89                          Group B        12/20/19 2-C+ 2.0 70 130            Right Brain        Group A        12/20/19 3-C+ 3.2 90 135  2.94

## 2019-12-21 ENCOUNTER — Telehealth: Payer: Self-pay | Admitting: Neurology

## 2019-12-24 NOTE — Telephone Encounter (Signed)
Spoke with Lelon Frohlich at [facility] and she stated a prior auth needs to be completed through [employer] for the patient to get his medication. And if the rx is denied then we can follow up with the patient assistance.

## 2019-12-26 ENCOUNTER — Encounter: Payer: Self-pay | Admitting: Neurology

## 2019-12-26 NOTE — Progress Notes (Addendum)
Received fax form to be completed for PA for Inbrija medication from [facility]. Completed forms and faxed back same day.   Received approval for medication valid from 12/26/19 to 02/28/98.

## 2019-12-31 ENCOUNTER — Telehealth: Payer: Self-pay

## 2019-12-31 NOTE — Telephone Encounter (Signed)
Opened in error

## 2020-01-02 ENCOUNTER — Other Ambulatory Visit: Payer: Self-pay | Admitting: Neurology

## 2020-01-02 MED ORDER — AMANTADINE HCL 100 MG PO CAPS
100.0000 mg | ORAL_CAPSULE | Freq: Two times a day (BID) | ORAL | 1 refills | Status: DC
Start: 2020-01-02 — End: 2020-07-09

## 2020-01-02 NOTE — Telephone Encounter (Signed)
Rx(s) sent to pharmacy electronically.  Patient notified and voiced understanding.  

## 2020-01-02 NOTE — Telephone Encounter (Signed)
Patient called and said he is out of refills for his amantadine 100 MG prescription and has no refills. He is requesting refills to be sent to [facility] to last until his next appointment in January 2022.

## 2020-01-21 ENCOUNTER — Other Ambulatory Visit: Payer: Self-pay | Admitting: Neurosurgery

## 2020-01-21 ENCOUNTER — Other Ambulatory Visit: Payer: Self-pay

## 2020-01-21 ENCOUNTER — Telehealth: Payer: Self-pay | Admitting: Neurology

## 2020-01-21 ENCOUNTER — Encounter (HOSPITAL_COMMUNITY): Payer: Self-pay | Admitting: Neurosurgery

## 2020-01-21 NOTE — Telephone Encounter (Signed)
Received a call from Dr. Vertell Limber that he is going to have to take the left DBS system out of the patient. Sx scheduled tomorrow.   It cultured Pseudomonas.  He is worried that it will be difficult to directly replace that in the future at that location because of the multiple surgeries and compromised, necrotic skin.

## 2020-01-21 NOTE — Progress Notes (Signed)
Spoke with pt and his wife, Patrick Ross for pre-op call. Pt denies cardiac history or Diabetes. Pt does have Parkinson's Disease.  Will need Covid test on arrival in the AM.

## 2020-01-21 NOTE — Telephone Encounter (Signed)
Pt is sch for 60 mins on 01-29-20 and he is aware of the appt date and time

## 2020-01-22 ENCOUNTER — Encounter (HOSPITAL_COMMUNITY): Payer: Self-pay | Admitting: Neurosurgery

## 2020-01-22 ENCOUNTER — Encounter (HOSPITAL_COMMUNITY)
Admission: RE | Disposition: A | Discharge status: Home / Self Care | Payer: Self-pay | Source: Home / Self Care | Attending: Neurosurgery

## 2020-01-22 ENCOUNTER — Inpatient Hospital Stay (HOSPITAL_COMMUNITY): Payer: Medicare Other | Admitting: Registered Nurse

## 2020-01-22 ENCOUNTER — Observation Stay (HOSPITAL_COMMUNITY)
Admission: RE | Admit: 2020-01-22 | Discharge: 2020-01-23 | Disposition: A | Discharge status: Home / Self Care | Payer: Medicare Other | Attending: Neurosurgery | Admitting: Neurosurgery

## 2020-01-22 DIAGNOSIS — Z20822 Contact with and (suspected) exposure to covid-19: Secondary | ICD-10-CM | POA: Diagnosis not present

## 2020-01-22 DIAGNOSIS — Z79899 Other long term (current) drug therapy: Secondary | ICD-10-CM | POA: Diagnosis not present

## 2020-01-22 DIAGNOSIS — S0100XA Unspecified open wound of scalp, initial encounter: Principal | ICD-10-CM

## 2020-01-22 DIAGNOSIS — A498 Other bacterial infections of unspecified site: Secondary | ICD-10-CM | POA: Insufficient documentation

## 2020-01-22 DIAGNOSIS — Z4800 Encounter for change or removal of nonsurgical wound dressing: Secondary | ICD-10-CM | POA: Diagnosis not present

## 2020-01-22 DIAGNOSIS — X58XXXA Exposure to other specified factors, initial encounter: Secondary | ICD-10-CM | POA: Insufficient documentation

## 2020-01-22 DIAGNOSIS — G20 Parkinson's disease: Secondary | ICD-10-CM | POA: Insufficient documentation

## 2020-01-22 DIAGNOSIS — E039 Hypothyroidism, unspecified: Secondary | ICD-10-CM | POA: Insufficient documentation

## 2020-01-22 DIAGNOSIS — M8660 Other chronic osteomyelitis, unspecified site: Secondary | ICD-10-CM

## 2020-01-22 HISTORY — PX: SUBTHALAMIC STIMULATOR BATTERY REPLACEMENT: SHX5405

## 2020-01-22 HISTORY — DX: Depression, unspecified: F32.A

## 2020-01-22 LAB — CBC
HCT: 50 % (ref 39.0–52.0)
Hemoglobin: 16.3 g/dL (ref 13.0–17.0)
MCH: 30.9 pg (ref 26.0–34.0)
MCHC: 32.6 g/dL (ref 30.0–36.0)
MCV: 94.9 fL (ref 80.0–100.0)
Platelets: 155 10*3/uL (ref 150–400)
RBC: 5.27 MIL/uL (ref 4.22–5.81)
RDW: 12.6 % (ref 11.5–15.5)
WBC: 5 10*3/uL (ref 4.0–10.5)
nRBC: 0 % (ref 0.0–0.2)

## 2020-01-22 LAB — BASIC METABOLIC PANEL
Anion gap: 11 (ref 5–15)
BUN: 15 mg/dL (ref 8–23)
CO2: 24 mmol/L (ref 22–32)
Calcium: 9 mg/dL (ref 8.9–10.3)
Chloride: 105 mmol/L (ref 98–111)
Creatinine, Ser: 0.86 mg/dL (ref 0.61–1.24)
GFR, Estimated: >60 mL/min (ref >60)
Glucose, Bld: 95 mg/dL (ref 70–99)
Potassium: 4.1 mmol/L (ref 3.5–5.1)
Sodium: 140 mmol/L (ref 135–145)

## 2020-01-22 LAB — SARS CORONAVIRUS 2 BY RT PCR (HOSPITAL ORDER, PERFORMED IN ~~LOC~~ HOSPITAL LAB): SARS Coronavirus 2: NEGATIVE

## 2020-01-22 SURGERY — SUBTHALAMIC STIMULATOR BATTERY REPLACEMENT
Anesthesia: General | Site: Head

## 2020-01-22 MED ORDER — LIDOCAINE-EPINEPHRINE 1 %-1:100000 IJ SOLN
INTRAMUSCULAR | Status: AC
  Filled 2020-01-22: qty 1

## 2020-01-22 MED ORDER — BUPIVACAINE HCL (PF) 0.5 % IJ SOLN
INTRAMUSCULAR | Status: DC | PRN
  Administered 2020-01-22: 7 mL

## 2020-01-22 MED ORDER — BUPIVACAINE HCL (PF) 0.5 % IJ SOLN
INTRAMUSCULAR | Status: AC
  Filled 2020-01-22: qty 30

## 2020-01-22 MED ORDER — CLONAZEPAM 0.5 MG PO TABS
1.5000 mg | ORAL_TABLET | Freq: Every day | ORAL | Status: DC
  Administered 2020-01-22: 1.5 mg via ORAL
  Filled 2020-01-22: qty 3

## 2020-01-22 MED ORDER — AMANTADINE HCL 100 MG PO CAPS
100.0000 mg | ORAL_CAPSULE | Freq: Two times a day (BID) | ORAL | Status: DC
  Administered 2020-01-23: 100 mg via ORAL
  Filled 2020-01-22 (×3): qty 1

## 2020-01-22 MED ORDER — ACETAMINOPHEN 650 MG RE SUPP: 650.0000 mg | RECTAL | Status: DC | PRN

## 2020-01-22 MED ORDER — DEXAMETHASONE SODIUM PHOSPHATE 4 MG/ML IJ SOLN
INTRAMUSCULAR | Status: DC | PRN
  Administered 2020-01-22: 10 mg via INTRAVENOUS

## 2020-01-22 MED ORDER — ACETAMINOPHEN 325 MG PO TABS: 650.0000 mg | ORAL_TABLET | ORAL | Status: DC | PRN

## 2020-01-22 MED ORDER — ORAL CARE MOUTH RINSE: 15.0000 mL | Freq: Once | OROMUCOSAL | Status: AC

## 2020-01-22 MED ORDER — SODIUM CHLORIDE 0.9 % IV SOLN
2.0000 g | Freq: Three times a day (TID) | INTRAVENOUS | Status: DC
  Administered 2020-01-22 – 2020-01-23 (×3): 2 g via INTRAVENOUS
  Filled 2020-01-22 (×6): qty 2

## 2020-01-22 MED ORDER — CEFAZOLIN SODIUM-DEXTROSE 2-4 GM/100ML-% IV SOLN
2.0000 g | INTRAVENOUS | Status: AC
  Administered 2020-01-22: 2 g via INTRAVENOUS
  Filled 2020-01-22: qty 100

## 2020-01-22 MED ORDER — FAMOTIDINE 20 MG PO TABS
20.0000 mg | ORAL_TABLET | Freq: Two times a day (BID) | ORAL | Status: DC
  Administered 2020-01-22 – 2020-01-23 (×2): 20 mg via ORAL
  Filled 2020-01-22: qty 1

## 2020-01-22 MED ORDER — VANCOMYCIN HCL 1000 MG IV SOLR
INTRAVENOUS | Status: DC | PRN
  Administered 2020-01-22: 1000 mg

## 2020-01-22 MED ORDER — BISACODYL 10 MG RE SUPP: 10.0000 mg | Freq: Every day | RECTAL | Status: DC | PRN

## 2020-01-22 MED ORDER — SODIUM CHLORIDE 0.9% FLUSH
3.0000 mL | Freq: Two times a day (BID) | INTRAVENOUS | Status: DC
  Administered 2020-01-22: 3 mL via INTRAVENOUS

## 2020-01-22 MED ORDER — TURMERIC 500 MG PO CAPS: ORAL_CAPSULE | Freq: Every day | ORAL | Status: DC

## 2020-01-22 MED ORDER — SODIUM CHLORIDE 0.9% FLUSH: 3.0000 mL | INTRAVENOUS | Status: DC | PRN

## 2020-01-22 MED ORDER — HYDROCODONE-ACETAMINOPHEN 5-325 MG PO TABS
1.0000 | ORAL_TABLET | ORAL | Status: DC | PRN
  Administered 2020-01-22 – 2020-01-23 (×2): 1 via ORAL
  Filled 2020-01-22 (×2): qty 1

## 2020-01-22 MED ORDER — VANCOMYCIN HCL 1000 MG IV SOLR
INTRAVENOUS | Status: AC
  Filled 2020-01-22: qty 1000

## 2020-01-22 MED ORDER — CHLORHEXIDINE GLUCONATE 0.12 % MT SOLN
15.0000 mL | Freq: Once | OROMUCOSAL | Status: AC
  Administered 2020-01-22: 15 mL via OROMUCOSAL
  Filled 2020-01-22: qty 15

## 2020-01-22 MED ORDER — LIDOCAINE HCL (CARDIAC) PF 100 MG/5ML IV SOSY
PREFILLED_SYRINGE | INTRAVENOUS | Status: DC | PRN
  Administered 2020-01-22: 100 mg via INTRAVENOUS

## 2020-01-22 MED ORDER — SODIUM CHLORIDE 0.9 % IV SOLN
2.0000 g | Freq: Once | INTRAVENOUS | Status: AC
  Administered 2020-01-22: 2 g via INTRAVENOUS
  Filled 2020-01-22: qty 2

## 2020-01-22 MED ORDER — LEVODOPA 42 MG IN CAPS: 2.0000 | ORAL_CAPSULE | Freq: Every day | RESPIRATORY_TRACT | Status: DC | PRN

## 2020-01-22 MED ORDER — MENTHOL 3 MG MT LOZG: 1.0000 | LOZENGE | OROMUCOSAL | Status: DC | PRN

## 2020-01-22 MED ORDER — KCL IN DEXTROSE-NACL 20-5-0.45 MEQ/L-%-% IV SOLN: INTRAVENOUS | Status: DC

## 2020-01-22 MED ORDER — ONDANSETRON HCL 4 MG/2ML IJ SOLN
INTRAMUSCULAR | Status: DC | PRN
  Administered 2020-01-22: 4 mg via INTRAVENOUS

## 2020-01-22 MED ORDER — ONDANSETRON HCL 4 MG PO TABS: 4.0000 mg | ORAL_TABLET | Freq: Four times a day (QID) | ORAL | Status: DC | PRN

## 2020-01-22 MED ORDER — FENTANYL CITRATE (PF) 250 MCG/5ML IJ SOLN
INTRAMUSCULAR | Status: AC
  Filled 2020-01-22: qty 5

## 2020-01-22 MED ORDER — MIRABEGRON ER 25 MG PO TB24
25.0000 mg | ORAL_TABLET | Freq: Every day | ORAL | Status: DC
  Filled 2020-01-22 (×2): qty 1

## 2020-01-22 MED ORDER — CARBIDOPA-LEVODOPA 25-100 MG PO TABS
1.0000 | ORAL_TABLET | Freq: Every day | ORAL | Status: DC
  Administered 2020-01-22 – 2020-01-23 (×3): 1 via ORAL
  Filled 2020-01-22 (×8): qty 1

## 2020-01-22 MED ORDER — 0.9 % SODIUM CHLORIDE (POUR BTL) OPTIME
TOPICAL | Status: DC | PRN
  Administered 2020-01-22: 1000 mL

## 2020-01-22 MED ORDER — POLYETHYLENE GLYCOL 3350 17 G PO PACK: 17.0000 g | PACK | Freq: Every day | ORAL | Status: DC | PRN

## 2020-01-22 MED ORDER — ALUM & MAG HYDROXIDE-SIMETH 200-200-20 MG/5ML PO SUSP: 30.0000 mL | Freq: Four times a day (QID) | ORAL | Status: DC | PRN

## 2020-01-22 MED ORDER — LEVOTHYROXINE SODIUM 25 MCG PO TABS
125.0000 ug | ORAL_TABLET | Freq: Every day | ORAL | Status: DC
  Administered 2020-01-23: 125 ug via ORAL
  Filled 2020-01-22: qty 1

## 2020-01-22 MED ORDER — PHENYLEPHRINE 40 MCG/ML (10ML) SYRINGE FOR IV PUSH (FOR BLOOD PRESSURE SUPPORT)
PREFILLED_SYRINGE | INTRAVENOUS | Status: DC | PRN
  Administered 2020-01-22 (×2): 80 ug via INTRAVENOUS
  Administered 2020-01-22: 40 ug via INTRAVENOUS
  Administered 2020-01-22: 80 ug via INTRAVENOUS

## 2020-01-22 MED ORDER — CEFAZOLIN SODIUM-DEXTROSE 2-4 GM/100ML-% IV SOLN: 2.0000 g | Freq: Three times a day (TID) | INTRAVENOUS | Status: DC

## 2020-01-22 MED ORDER — MIDAZOLAM HCL 2 MG/2ML IJ SOLN
INTRAMUSCULAR | Status: AC
  Filled 2020-01-22: qty 2

## 2020-01-22 MED ORDER — ROCURONIUM BROMIDE 10 MG/ML (PF) SYRINGE
PREFILLED_SYRINGE | INTRAVENOUS | Status: DC | PRN
  Administered 2020-01-22: 70 mg via INTRAVENOUS

## 2020-01-22 MED ORDER — PROPOFOL 10 MG/ML IV BOLUS
INTRAVENOUS | Status: AC
  Filled 2020-01-22: qty 20

## 2020-01-22 MED ORDER — LACTATED RINGERS IV SOLN: INTRAVENOUS | Status: DC

## 2020-01-22 MED ORDER — LISINOPRIL 20 MG PO TABS
20.0000 mg | ORAL_TABLET | Freq: Every day | ORAL | Status: DC
  Administered 2020-01-23: 20 mg via ORAL

## 2020-01-22 MED ORDER — PANTOPRAZOLE SODIUM 40 MG IV SOLR: 40.0000 mg | Freq: Every day | INTRAVENOUS | Status: DC

## 2020-01-22 MED ORDER — CHLORHEXIDINE GLUCONATE CLOTH 2 % EX PADS: 6.0000 | MEDICATED_PAD | Freq: Once | CUTANEOUS | Status: DC

## 2020-01-22 MED ORDER — ONDANSETRON HCL 4 MG/2ML IJ SOLN: 4.0000 mg | Freq: Four times a day (QID) | INTRAMUSCULAR | Status: DC | PRN

## 2020-01-22 MED ORDER — PHENOL 1.4 % MT LIQD: 1.0000 | OROMUCOSAL | Status: DC | PRN

## 2020-01-22 MED ORDER — BACITRACIN ZINC 500 UNIT/GM EX OINT
TOPICAL_OINTMENT | CUTANEOUS | Status: DC | PRN
  Administered 2020-01-22: 1 via TOPICAL

## 2020-01-22 MED ORDER — BACITRACIN ZINC 500 UNIT/GM EX OINT
TOPICAL_OINTMENT | CUTANEOUS | Status: AC
  Filled 2020-01-22: qty 28.35

## 2020-01-22 MED ORDER — LIDOCAINE-EPINEPHRINE 1 %-1:100000 IJ SOLN
INTRAMUSCULAR | Status: DC | PRN
  Administered 2020-01-22: 7 mL

## 2020-01-22 MED ORDER — ESCITALOPRAM OXALATE 10 MG PO TABS
10.0000 mg | ORAL_TABLET | Freq: Every day | ORAL | Status: DC
  Administered 2020-01-22: 10 mg via ORAL
  Filled 2020-01-22 (×2): qty 1

## 2020-01-22 MED ORDER — HYDROCODONE-ACETAMINOPHEN 5-325 MG PO TABS: 2.0000 | ORAL_TABLET | ORAL | Status: DC | PRN

## 2020-01-22 MED ORDER — DOCUSATE SODIUM 100 MG PO CAPS
100.0000 mg | ORAL_CAPSULE | Freq: Two times a day (BID) | ORAL | Status: DC
  Administered 2020-01-22 – 2020-01-23 (×2): 100 mg via ORAL
  Filled 2020-01-22: qty 1

## 2020-01-22 MED ORDER — THROMBIN 5000 UNITS EX SOLR
OROMUCOSAL | Status: DC | PRN
  Administered 2020-01-22: 5 mL via TOPICAL

## 2020-01-22 MED ORDER — SODIUM CHLORIDE 0.9 % IV SOLN
250.0000 mL | INTRAVENOUS | Status: DC
  Administered 2020-01-22: 250 mL via INTRAVENOUS

## 2020-01-22 MED ORDER — THROMBIN 5000 UNITS EX SOLR
CUTANEOUS | Status: AC
  Filled 2020-01-22: qty 5000

## 2020-01-22 MED ORDER — TESTOSTERONE 10 MG/ACT (2%) TD GEL: 6.0000 | Freq: Every day | TRANSDERMAL | Status: DC

## 2020-01-22 MED ORDER — PROPOFOL 10 MG/ML IV BOLUS
INTRAVENOUS | Status: DC | PRN
  Administered 2020-01-22: 150 mg via INTRAVENOUS

## 2020-01-22 MED ORDER — FENTANYL CITRATE (PF) 100 MCG/2ML IJ SOLN
INTRAMUSCULAR | Status: DC | PRN
  Administered 2020-01-22: 50 ug via INTRAVENOUS
  Administered 2020-01-22: 125 ug via INTRAVENOUS

## 2020-01-22 MED ORDER — FLEET ENEMA 7-19 GM/118ML RE ENEM: 1.0000 | ENEMA | Freq: Once | RECTAL | Status: DC | PRN

## 2020-01-22 SURGICAL SUPPLY — 40 items
CANISTER SUCT 3000ML PPV (MISCELLANEOUS) ×3 IMPLANT
CARTRIDGE OIL MAESTRO DRILL (MISCELLANEOUS) IMPLANT
COVER WAND RF STERILE (DRAPES) ×3 IMPLANT
DECANTER SPIKE VIAL GLASS SM (MISCELLANEOUS) ×3 IMPLANT
DERMABOND ADVANCED (GAUZE/BANDAGES/DRESSINGS) ×2
DERMABOND ADVANCED .7 DNX12 (GAUZE/BANDAGES/DRESSINGS) ×1 IMPLANT
DIFFUSER DRILL AIR PNEUMATIC (MISCELLANEOUS) IMPLANT
DRAPE LAPAROTOMY 100X72 PEDS (DRAPES) ×3 IMPLANT
DRSG OPSITE 4X5.5 SM (GAUZE/BANDAGES/DRESSINGS) ×9 IMPLANT
DRSG TELFA 3X8 NADH (GAUZE/BANDAGES/DRESSINGS) ×9 IMPLANT
DURAPREP 26ML APPLICATOR (WOUND CARE) ×3 IMPLANT
GAUZE 4X4 16PLY RFD (DISPOSABLE) IMPLANT
GLOVE BIO SURGEON STRL SZ8 (GLOVE) ×3 IMPLANT
GLOVE BIOGEL PI IND STRL 7.0 (GLOVE) ×1 IMPLANT
GLOVE BIOGEL PI IND STRL 8 (GLOVE) ×3 IMPLANT
GLOVE BIOGEL PI IND STRL 8.5 (GLOVE) ×1 IMPLANT
GLOVE BIOGEL PI INDICATOR 7.0 (GLOVE) ×2
GLOVE BIOGEL PI INDICATOR 8 (GLOVE) ×6
GLOVE BIOGEL PI INDICATOR 8.5 (GLOVE) ×2
GLOVE ECLIPSE 7.5 STRL STRAW (GLOVE) ×12 IMPLANT
GLOVE ECLIPSE 8.0 STRL XLNG CF (GLOVE) ×3 IMPLANT
GLOVE EXAM NITRILE XL STR (GLOVE) IMPLANT
GOWN STRL REUS W/ TWL LRG LVL3 (GOWN DISPOSABLE) IMPLANT
GOWN STRL REUS W/ TWL XL LVL3 (GOWN DISPOSABLE) ×1 IMPLANT
GOWN STRL REUS W/TWL 2XL LVL3 (GOWN DISPOSABLE) ×6 IMPLANT
GOWN STRL REUS W/TWL LRG LVL3 (GOWN DISPOSABLE)
GOWN STRL REUS W/TWL XL LVL3 (GOWN DISPOSABLE) ×2
HEMOSTAT POWDER KIT SURGIFOAM (HEMOSTASIS) ×3 IMPLANT
KIT BASIN OR (CUSTOM PROCEDURE TRAY) ×3 IMPLANT
KIT TURNOVER KIT B (KITS) ×3 IMPLANT
NEEDLE HYPO 25X1 1.5 SAFETY (NEEDLE) ×3 IMPLANT
NS IRRIG 1000ML POUR BTL (IV SOLUTION) ×3 IMPLANT
OIL CARTRIDGE MAESTRO DRILL (MISCELLANEOUS)
PACK LAMINECTOMY NEURO (CUSTOM PROCEDURE TRAY) ×3 IMPLANT
PAD ARMBOARD 7.5X6 YLW CONV (MISCELLANEOUS) ×9 IMPLANT
SUT VIC AB 2-0 CP2 18 (SUTURE) ×6 IMPLANT
SUT VIC AB 3-0 SH 8-18 (SUTURE) ×3 IMPLANT
TOWEL GREEN STERILE (TOWEL DISPOSABLE) ×3 IMPLANT
TOWEL GREEN STERILE FF (TOWEL DISPOSABLE) ×3 IMPLANT
WATER STERILE IRR 1000ML POUR (IV SOLUTION) ×3 IMPLANT

## 2020-01-22 NOTE — Anesthesia Preprocedure Evaluation (Signed)
Anesthesia Evaluation  Patient identified by MRN, date of birth, ID band Patient awake    Reviewed: Allergy & Precautions, NPO status , Patient's Chart, lab work & pertinent test results  History of Anesthesia Complications (+) PONV  Airway Mallampati: II  TM Distance: >3 FB Neck ROM: Limited    Dental no notable dental hx.    Pulmonary sleep apnea ,    Pulmonary exam normal breath sounds clear to auscultation       Cardiovascular hypertension, Normal cardiovascular exam Rhythm:Regular Rate:Normal     Neuro/Psych Parkinsons dz  Neuromuscular disease negative psych ROS   GI/Hepatic Neg liver ROS, GERD  ,  Endo/Other  Hypothyroidism   Renal/GU negative Renal ROS  negative genitourinary   Musculoskeletal negative musculoskeletal ROS (+)   Abdominal   Peds negative pediatric ROS (+)  Hematology negative hematology ROS (+)   Anesthesia Other Findings   Reproductive/Obstetrics negative OB ROS                             Anesthesia Physical Anesthesia Plan  ASA: III  Anesthesia Plan: General   Post-op Pain Management:    Induction: Intravenous  PONV Risk Score and Plan: 3 and Ondansetron, Dexamethasone and Treatment may vary due to age or medical condition  Airway Management Planned: LMA  Additional Equipment:   Intra-op Plan:   Post-operative Plan: Extubation in OR  Informed Consent: I have reviewed the patients History and Physical, chart, labs and discussed the procedure including the risks, benefits and alternatives for the proposed anesthesia with the patient or authorized representative who has indicated his/her understanding and acceptance.     Dental advisory given  Plan Discussed with: CRNA and Surgeon  Anesthesia Plan Comments:        Anesthesia Quick Evaluation

## 2020-01-22 NOTE — Anesthesia Procedure Notes (Signed)
Procedure Name: Intubation Date/Time: 01/22/2020 2:10 PM Performed by: Babs Bertin, CRNA Pre-anesthesia Checklist: Patient identified, Emergency Drugs available, Suction available and Patient being monitored Patient Re-evaluated:Patient Re-evaluated prior to induction Oxygen Delivery Method: Circle System Utilized Preoxygenation: Pre-oxygenation with 100% oxygen Induction Type: IV induction Ventilation: Mask ventilation without difficulty Laryngoscope Size: Miller and 2 Grade View: Grade I Tube type: Oral Tube size: 7.0 mm Number of attempts: 1 Airway Equipment and Method: Stylet and Oral airway Placement Confirmation: ETT inserted through vocal cords under direct vision,  positive ETCO2 and breath sounds checked- equal and bilateral Secured at: 22 cm Tube secured with: Tape Dental Injury: Teeth and Oropharynx as per pre-operative assessment

## 2020-01-22 NOTE — Consult Note (Signed)
[facility] for Infectious Disease    Date of Admission:  01/22/2020     Reason for Consult: wound infection/cellulitis, hardware complicating wound infection     Referring Provider: Vertell Limber    Lines:  Peripheral IV  Abx: 11/23-c cefepime  Prior to admission doxycycline and topical gentamycin         Assessment: 74 yo male OSA, anxiety, GERD, hypothyroidism, lumbar radiculopathy s/p lumbar fusion 2014,  parkinson's disease, s/p right deep brain stimulator placement 11/2011 and left DBS 62/9528, complicated by displacement of left DBS needing revision  01/2013 and again 12/2018, and most recently soft tissue infection with exposed electrode of the left DBS with pseudomonas admitted 11/23 for semi-elective I&D and s/p hardware removal.   Of note he had been receiving doxycycline and getting topical gentamycin from his dermatology office for the soft tissue infection process  Given the exposed hardware, which connects through bone into the cns area, will treat as Osteomyelitis which will cover for any potential nosocomial meningitis  No recent cns imaging but doesn't appear to exhibit intracranial s/s of meningitis/encephalitis   Plan: 1. Will discuss with dr Vertell Limber regarding previous wound cx pseudomonas susceptibility result 2. F/u operative culture 3. Agree with cefepime for now cns dosing 2 gram q8hr Lab Results  Component Value Date   CREATININE 0.86 01/22/2020  4.   Duration of cefepime (or appropriate abx per culture result) will be 6 weeks starting 11/24 until 03/05/2020 5.   Cns imaging in future will be determine clinically if needed   Active Problems:   * No active hospital problems. *   Scheduled Meds: . Chlorhexidine Gluconate Cloth  6 each Topical Once   And  . Chlorhexidine Gluconate Cloth  6 each Topical Once   Continuous Infusions: .  ceFAZolin (ANCEF) IV    . lactated ringers 10 mL/hr at 01/22/20 1121   PRN Meds:.  HPI: Patrick Ross is a 74 y.o. male parkinson disease s/p bilateral dbs, osa, gerd, anxiety, hypothyroidism, admitted for I&d and left dbs hardware removal in setting of several weeks open wound exposing the left DBS electrode, with wound cx growing pseudomonas  Hx per patient/chart Patient had bilateral dbs placement 2013 right and 2014 left but the left sided displaced and needed a few revision most recently in 2020.  Patient bumped his head beginning of 11/2019, and it appears patient has been having recent left DBS surgical site open wound/discharge a week after the bump. Denies f/c/headache, confusion. Said the discharge/oozing is clear fluid. He said he was given some topical cream; doesn't remember if given abx. Chart review indicate doxycycline was given around early-mid 11/2019 and subsequently topical gentamycin  Per Dr Melven Sartorius note, the wound had opened up more with electrode exposure as well  He was admitted 11/23 for I&d and left dbs extraction  I saw him in the pacu 1 hour after procedure  Review of Systems: ROS Negative 11 point ros unless mentioned above  Past Medical History:  Diagnosis Date  . Depression   . GERD (gastroesophageal reflux disease)   . History of kidney stones    passed  . Hypertension   . Hypothyroidism   . Lumbar stenosis   . OSA (obstructive sleep apnea)    wears cpap  . Parkinson's disease ([place])   . PONV (postoperative nausea and vomiting)   . REM behavioral disorder   . Urinary incontinence     Social History  Tobacco Use  . Smoking status: Never Smoker  . Smokeless tobacco: Never Used  Vaping Use  . Vaping Use: Never used  Substance Use Topics  . Alcohol use: Not Currently  . Drug use: Never    Family History  Problem Relation Age of Onset  . AAA (abdominal aortic aneurysm) Mother        72 yo  . Heart disease Father   . Diabetes Father   . Heart disease Brother   . Diabetes Brother   . Healthy Child    Allergies  Allergen Reactions   . Other Other (See Comments)    Surgical spray prior to adhesive causes blisters  . Sulfamethoxazole Other (See Comments)    Unsure--childhood allergy    OBJECTIVE: Blood pressure (!) 176/105, pulse 87, temperature 97.7 F (36.5 C), temperature source Oral, resp. rate 18, height 6\' 1"  (1.854 m), weight 104.3 kg, SpO2 99 %.  Physical Exam No distress, conversant, follows commands Left head shaved; wound dressign clean/dry; conj clear; eomi Neck supple cv rrr no mrg Lungs clear; normal respiratory effort abd s/nt Ext no edema Skin no rash Neuro stuttering and mild resting tremor; strength symmetric 4-5/5 all extremities; no rigidity Psych alert/oriented; good insight  Lab Results Lab Results  Component Value Date   WBC 5.0 01/22/2020   HGB 16.3 01/22/2020   HCT 50.0 01/22/2020   MCV 94.9 01/22/2020   PLT 155 01/22/2020    Lab Results  Component Value Date   CREATININE 0.86 01/22/2020   BUN 15 01/22/2020   NA 140 01/22/2020   K 4.1 01/22/2020   CL 105 01/22/2020   CO2 24 01/22/2020    Lab Results  Component Value Date   ALT 27 04/12/2008   AST 32 04/12/2008   ALKPHOS 75 04/12/2008   BILITOT 1.2 04/12/2008     Microbiology: Recent Results (from the past 240 hour(s))  SARS Coronavirus 2 by RT PCR (hospital order, performed in [facility] hospital lab) Nasopharyngeal Nasopharyngeal Swab     Status: None   Collection Time: 01/22/20 10:23 AM   Specimen: Nasopharyngeal Swab  Result Value Ref Range Status   SARS Coronavirus 2 NEGATIVE NEGATIVE Final    Comment: (NOTE) SARS-CoV-2 target nucleic acids are NOT DETECTED.  The SARS-CoV-2 RNA is generally detectable in upper and lower respiratory specimens during the acute phase of infection. The lowest concentration of SARS-CoV-2 viral copies this assay can detect is 250 copies / mL. A negative result does not preclude SARS-CoV-2 infection and should not be used as the sole basis for treatment or other patient  management decisions.  A negative result may occur with improper specimen collection / handling, submission of specimen other than nasopharyngeal swab, presence of viral mutation(s) within the areas targeted by this assay, and inadequate number of viral copies (<250 copies / mL). A negative result must be combined with clinical observations, patient history, and epidemiological information.  Fact Sheet for Patients:   StrictlyIdeas.no  Fact Sheet for Healthcare Providers: BankingDealers.co.za  This test is not yet approved or  cleared by the [place] FDA and has been authorized for detection and/or diagnosis of SARS-CoV-2 by FDA under an Emergency Use Authorization (EUA).  This EUA will remain in effect (meaning this test can be used) for the duration of the COVID-19 declaration under Section 564(b)(1) of the Act, 21 U.S.C. section 360bbb-3(b)(1), unless the authorization is terminated or revoked sooner.  Performed at [facility], [place] [address] [place], [place]  [address]    Imaging: 12/12/2019 head ct noncontrast 1. No acute intracranial abnormality. Stable and satisfactory post DBS non contrast CT appearance of the brain. 2. Stable chronic paranasal sinus disease.  Jabier Mutton, [facility] [place] [phone] pager    01/22/2020, 2:13 PM

## 2020-01-22 NOTE — Op Note (Signed)
01/22/2020  3:30 PM  PATIENT:  Patrick Ross  74 y.o. male  PRE-OPERATIVE DIAGNOSIS:  Open scalp wound over deep brain stimulator electrode on the left  POST-OPERATIVE DIAGNOSIS: Open scalp wound over deep brain stimulator electrode on the left  PROCEDURE:  Procedure(s): REMOVAL OF LEFT BRAIN DEEP BRAIN STIMULATOR ELECTRODE, EXTENSION, AND PULSE GENERATOR (N/A)  SURGEON:  Surgeon(s) and Role:    * Erline Levine, MD - Primary  PHYSICIAN ASSISTANT: Glenford Peers, NP  ASSISTANTS: [place], RN   ANESTHESIA:   general  EBL:  75 mL   BLOOD ADMINISTERED:none  DRAINS: none   LOCAL MEDICATIONS USED:  MARCAINE    and LIDOCAINE   SPECIMEN:  Source of Specimen:  DBS cap, elctrode and cultures sent to microbiology  DISPOSITION OF SPECIMEN:  Microbiology  COUNTS:  YES  TOURNIQUET:  * No tourniquets in log *   DICTATION: Patient has implanted bilateral subthalamic stimulator electrodes for Parkinson's Disease, initially placed at [facility] in 2017, revised electrode positioning on the left approximately one year ago.  Patient hit his head a few weeks ago and cut his scalp direct over left DBS cap and lead.  This became infected and wound broke down.  He grew pseudomonas from this wound, so it was elected to remove the entire left sided system.  His right sided system was intact and was preserved.   PROCEDURE: Patient was brought to the operating room and GETA anesthesia was induced.  Left upper chest, scalp, neck were prepped with betadine scrub and Duraprep.  Area of planned incision was infiltrated with lidocaine.  The scalp wound was reopened and the DBS cap and lead were exposed.  These were removed and multiple cultures were obtained.  The scalp was undermined to be able to close the scalp defect.  Posterior (post-auricular) scalp incision was made and the lead extension was exposed and the lead was cut. An incision was made in the left upper chest overlying the previously implanted implantable  pulse generator.The generator and extensions were removed.  All hardware was removed.  Wounds were irrigated with vancomycin.  Incisions were closed with 2-0 Vicryl and staples at the pocket and 2-0 vicryl at the scalp with staples.  We were able to close the scalp wound quite well with Vicryl sutures and staples.   Wounds were dressed with sterile occlusive dressings.  Counts were correct at the end of the case.  PLAN OF CARE: Admit to inpatient   PATIENT DISPOSITION:  PACU - hemodynamically stable.   Delay start of Pharmacological VTE agent (>24hrs) due to surgical blood loss or risk of bleeding: yes

## 2020-01-22 NOTE — Transfer of Care (Signed)
Immediate Anesthesia Transfer of Care Note  Patient: Patrick Ross  Procedure(s) Performed: REMOVAL OF LEFT BRAIN DEEP BRAIN STIMULATOR ELECTRODE, EXTENSION, AND PULSE GENERATOR (N/A Head)  Patient Location: PACU  Anesthesia Type:General  Level of Consciousness: awake, alert , oriented and patient cooperative  Airway & Oxygen Therapy: Patient Spontanous Breathing and Patient connected to face mask oxygen  Post-op Assessment: Report given to RN and Post -op Vital signs reviewed and stable  Post vital signs: Reviewed and stable  Last Vitals:  Vitals Value Taken Time  BP 153/92 01/22/20 1559  Temp    Pulse 84 01/22/20 1603  Resp 14 01/22/20 1603  SpO2 100 % 01/22/20 1603  Vitals shown include unvalidated device data.  Last Pain:  Vitals:   01/22/20 1113  TempSrc:   PainSc: 0-No pain         Complications: No complications documented.

## 2020-01-22 NOTE — Progress Notes (Signed)
  Pharmacy Antibiotic Note  Patrick Ross is a 74 y.o. male admitted on 01/22/2020 with Open scalp wound over deep brain stimulator electrode on the left. Pharmacy has been consulted for cefepime dosing.  Cefepime given at 14:30 intra-op. Good renal function. OR culture sent.   Plan: Cefepime 2g Q 8 hr Monitor cultures, clinical status, renal fx Narrow abx as able and f/u duration     Height: 6\' 1"  (185.4 cm) Weight: 104.3 kg (230 lb) IBW/kg (Calculated) : 79.9  Temp (24hrs), Avg:97.7 F (36.5 C), Min:97.7 F (36.5 C), Max:97.7 F (36.5 C)  Recent Labs  Lab 01/22/20 1109  WBC 5.0  CREATININE 0.86    Estimated Creatinine Clearance: 98.5 mL/min (by C-G formula based on SCr of 0.86 mg/dL).    Allergies  Allergen Reactions  . Other Other (See Comments)    Surgical spray prior to adhesive causes blisters  . Sulfamethoxazole Other (See Comments)    Unsure--childhood allergy    Antimicrobials this admission: Cefaz 11/23   Vanc 11/23 Cefepime 11/23 >>   Microbiology results: 11/23 OR tissue cx  11/23OR hardware cx  Benetta Spar, PharmD, BCPS, BCCP Clinical Pharmacist  Please check AMION for all [place] phone numbers After 10:00 PM, call [place] [phone]

## 2020-01-22 NOTE — Interval H&P Note (Signed)
History and Physical Interval Note:  01/22/2020 1:22 PM  Katha Hamming  has presented today for surgery, with the diagnosis of PSEUDOMONAS AERUGINOSA INFECTION.  The various methods of treatment have been discussed with the patient and family. After consideration of risks, benefits and other options for treatment, the patient has consented to  Procedure(s): REMOVAL OF LEFT BRAIN DEEP BRAIN STIMULATOR ELECTRODE, EXTENSION, AND PULSE GENERATOR (N/A) as a surgical intervention.  The patient's history has been reviewed, patient examined, no change in status, stable for surgery.  I have reviewed the patient's chart and labs.  Questions were answered to the patient's satisfaction.     Peggyann Shoals

## 2020-01-22 NOTE — Progress Notes (Addendum)
Assessment/Plan:   1.  Parkinsons Disease             -The patient underwent right STN DBS on December 10, 2011 and a left STN DBS on December 27, 2012. Postoperatively, his left STN DBS lead was found to be displaced and had revision of the left lead on February 02, 2013 (pulled back 1 cm without MER). IPGon the RIGHTchanged on 01/23/19. IPG on the left last changed on 02/02/18 -Patient underwent lead revision on January 16, 2019 on the left, as the lead was still displaced/deep. It was pulled back 7.5 mm. Postoperative imaging indicated that it was still 5 mm toodeep, so when the battery was changed to the following week, it was pulled back another 5 mm.  Postoperative imaging indicates that the leads were in good place, but the patient hit his head on the corner of a wall and developed an abrasion over the lead, which ultimately exposed to lead.  This led to a wound infection, requiring that the entire system on the left be removed on January 22, 2020.    -Discussed with patient that DBS system on the left will likely need to be left out for at least 6 months.  When we do replace it, it will be challenging given skin integrity on the scalp.  -Patient given the following instructions: Take carbidopa/levodopa 25/100 2 tablet at 7 AM/ 2 tablets at 10 AM/ 1 at 1 PM/4 PM/ and 7 PM for the next 2 days.  If still not doing well, take carbidopa/levodopa 25/100, 2 at 7am/2 at 10am, 2 at 1 pm, 1 at 4pm, 1 at 7pm  -Patient has had significant dyskinesia in the past with higher dosages of levodopa, but currently has much less motor control with the DBS system removed on the left.  If he does have significant dyskinesia on the left body with higher dosages of levodopa, we will need to potentially either turn down that side or turn off that side.  I spent time with the patient and wife giving him adjustment capability for the right brain/left body and showing them how to adjust it.  -Continue  amantadine, 100 mg twice per day  -Continue Inbrija as needed, up to 5 times per day, separated by 2-hour intervals.  -pt needs hospital bed.  Patient unable to turn in the bed.  Patient actually not even sleeping in bed currently because cannot get in it.  He has been in a Careers adviser.  Patient requires repositioning to reduce pressure, which he is not able to do on his own because of Parkinson's disease and the fact that we just took his stimulator out.  Wife has attempted to do this on her own, but has been unable (it took wife and 2 of my staff members just to get him into the office today).  Patient does require the head of the bed to be elevated more than 30 degrees because of risk of aspiration with Parkinson's disease.  Patient requires trapeze to help him turn over in the bed because of Parkinson's disease.  Gel overlay required due to the fact that he is currently completely immobile due to the fact that we took his deep brain stimulator out and due to the fact that he currently has bowel incontinence.  Will write [facility] and [facility]  -encouraged pt to reconsider SNF for therapy and he said he was going to  2.  RBD             -  Continue clonazepam, 0.5 mg, 1.5 tablets at night  3.  Urinary incontinence             -Following with Dr. Felipa Eth in [facility] (urology), who started him on Myrbetriq in September, 2021.  4.  Low back pain             -Follows with Dr. Letta Pate as well as [facility] neurosurgery  5.  Depression  -They asked about counseling.  I think this is a good idea.  We gave him information to Myra Gianotti, but he thinks he is going to explore counseling through a pastor first.  6.  Patient has an appointment with me in a month.  I think he should keep that.  Subjective:   Patrick Ross was seen today.  Patient worked in today.  I talked to Dr. Vertell Limber since last visit on the phone about the patient.  Patient had exposure of the  lead.  He saw dermatology and the wound grew out Pseudomonas.  Ultimately, it was decided to remove the entire lead/DBS system on the left on January 22, 2020.  The right system remains intact.  Dr. Vertell Limber expressed concerned that the skin over that area has become weakened and necrotic and worries about going in over that exact area in the future.  Since the removal of the lead, the patient states that he has had the worst time that he has had in 20 years.  He is having uncontrollable diarrhea as well due to abx.  He has a PICC line in.   [facility] and helping with infusions.    Current prescribed movement disorder medications: Carbidopa/levodopa 25/100, 1 tablet at 7 AM/10 AM/1 PM/4 PM/7 PM Clonazepam 0.5 mg, 1-1/2 tablets at bedtime Amantadine, 100 mg twice per day  Inbrija (using bid)   ALLERGIES:   Allergies  Allergen Reactions  . Other Other (See Comments)    Surgical spray prior to adhesive causes blisters  . Sulfamethoxazole Other (See Comments)    Unsure--childhood allergy    CURRENT MEDICATIONS:  Outpatient Encounter Medications as of 01/29/2020  Medication Sig  . amantadine (SYMMETREL) 100 MG capsule Take 1 capsule (100 mg total) by mouth 2 (two) times daily.  . carbidopa-levodopa (SINEMET IR) 25-100 MG tablet Take 1 tablet by mouth 5 (five) times daily.  Marland Kitchen ceFEPime (MAXIPIME) IVPB Inject 2 g into the vein every 8 (eight) hours. Indication:  Osteomyelitis/CNS infection First Dose: No Last Day of Therapy:  03/05/2020 Labs - Once weekly:  CBC/D and BMP, Labs - Every other week:  ESR and CRP Method of administration: IV Push Method of administration may be changed at the discretion of home infusion pharmacist based upon assessment of the patient and/or caregiver's ability to self-administer the medication ordered.  . clonazePAM (KLONOPIN) 0.5 MG tablet 1.5 tabs at bed (Patient taking differently: Take 1.5 mg by mouth at bedtime. )  . escitalopram  (LEXAPRO) 10 MG tablet Take 10 mg by mouth at bedtime.  . famotidine (PEPCID) 20 MG tablet Take 20 mg by mouth 2 (two) times daily.  . Levodopa (INBRIJA) 42 MG CAPS Place 2 capsules into inhaler and inhale 5 (five) times daily as needed (up to 5 times a day as needed). (Patient taking differently: Place 2 capsules into inhaler and inhale 5 (five) times daily as needed (movement). )  . levothyroxine (SYNTHROID) 125 MCG tablet Take 125 mcg by mouth daily.   Marland Kitchen lisinopril (PRINIVIL,ZESTRIL) 20  MG tablet Take 20 mg by mouth daily.   . mirabegron ER (MYRBETRIQ) 25 MG TB24 tablet Take 25 mg by mouth at bedtime.  Marland Kitchen OVER THE COUNTER MEDICATION Take 2 tablets by mouth 2 (two) times daily. Alpha CRS otc supplement  . OVER THE COUNTER MEDICATION Take 2 capsules by mouth 2 (two) times daily. Microplex VMz otc supplement  . OVER THE COUNTER MEDICATION Take 2 capsules by mouth 2 (two) times daily. XEOmega 3 oil complex  . OVER THE COUNTER MEDICATION Take 1 tablet by mouth daily. onguard+ otc supplement  . OVER THE COUNTER MEDICATION Take 3 capsules by mouth at bedtime. terrazyme supplement  . OVER THE COUNTER MEDICATION Take 1 tablet by mouth 2 (two) times daily. Bone nutrient otc supplement  . OVER THE COUNTER MEDICATION Take 1 capsule by mouth at bedtime. PB Assist pro/prebiotic  . Testosterone (FORTESTA) 10 MG/ACT (2%) GEL Place 6 Pump onto the skin daily. 3 pump on each inner thigh  . [DISCONTINUED] gentamicin ointment (GARAMYCIN) 0.1 % Apply 1 application topically 3 (three) times daily.  . [DISCONTINUED] TURMERIC PO Take 1 tablet by mouth daily.    No facility-administered encounter medications on file as of 01/29/2020.    Objective:   PHYSICAL EXAMINATION:    VITALS:   Vitals:   01/29/20 1005  BP: (!) 142/85  Pulse: 88  Weight: 230 lb (104.3 kg)  Height: 6' 3" (1.905 m)    GEN:  The patient appears stated age and is in NAD. HEENT:  Normocephalic.  Staples in place, L scalp.  Wound looks  good  The mucous membranes are moist. The superficial temporal arteries are without ropiness or tenderness. CV:  RRR Lungs:  CTAB Neck/HEME:  There are no carotid bruits bilaterally.  Neurological examination:  Orientation: The patient is alert and oriented x3. Cranial nerves: There is good facial symmetry with facial hypomimia. The speech is fluent and clear.  He is less dysarthric. Soft palate rises symmetrically and there is no tongue deviation. Hearing is intact to conversational tone. Sensation: Sensation is intact to light touch throughout Motor: Strength is at least antigravity x4.  Movement examination: Tone: There is mod increased tone in the RUE/RLE Abnormal movements: none Coordination:  There is mod to severe decremation on the R, LE>UE Gait and Station: The patient requests not trying to ambulate  I have reviewed and interpreted the following labs independently   Chemistry      Component Value Date/Time   NA 140 01/22/2020 1109   K 4.1 01/22/2020 1109   CL 105 01/22/2020 1109   CO2 24 01/22/2020 1109   BUN 15 01/22/2020 1109   CREATININE 0.86 01/22/2020 1109      Component Value Date/Time   CALCIUM 9.0 01/22/2020 1109   ALKPHOS 75 04/12/2008 0524   AST 32 04/12/2008 0524   ALT 27 04/12/2008 0524   BILITOT 1.2 04/12/2008 0524       DBS programming was performed today which is described in more detail on a separate programming procedure note.  In brief, rapid alternating movements were markedly better after reprogramming.  Total time spent on today's visit was 45 minutes, including both face-to-face time and nonface-to-face time.  Time included that spent on review of records (prior notes available to me/labs/imaging if pertinent), discussing treatment and goals, answering patient's questions and coordinating care.  This did not include DBS programming time, described in more detail on separate programming procedural note.  Cc:  Jefm Petty, MD

## 2020-01-22 NOTE — Brief Op Note (Signed)
01/22/2020  3:30 PM  PATIENT:  Patrick Ross  74 y.o. male  PRE-OPERATIVE DIAGNOSIS:  Open scalp wound over deep brain stimulator electrode on the left  POST-OPERATIVE DIAGNOSIS: Open scalp wound over deep brain stimulator electrode on the left  PROCEDURE:  Procedure(s): REMOVAL OF LEFT BRAIN DEEP BRAIN STIMULATOR ELECTRODE, EXTENSION, AND PULSE GENERATOR (N/A)  SURGEON:  Surgeon(s) and Role:    * Erline Levine, MD - Primary  PHYSICIAN ASSISTANT: Glenford Peers, NP  ASSISTANTS: Poteat, RN   ANESTHESIA:   general  EBL:  75 mL   BLOOD ADMINISTERED:none  DRAINS: none   LOCAL MEDICATIONS USED:  MARCAINE    and LIDOCAINE   SPECIMEN:  Source of Specimen:  DBS cap, elctrode and cultures sent to microbiology  DISPOSITION OF SPECIMEN:  Microbiology  COUNTS:  YES  TOURNIQUET:  * No tourniquets in log *   DICTATION: Patient has implanted bilateral subthalamic stimulator electrodes for Parkinson's Disease, initially placed at [facility] in 2017, revised electrode positioning on the left approximately one year ago.  Patient hit his head a few weeks ago and cut his scalp direct over left DBS cap and lead.  This became infected and wound broke down.  He grew pseudomonas from this wound, so it was elected to remove the entire left sided system.  His right sided system was intact and was preserved.   PROCEDURE: Patient was brought to the operating room and GETA anesthesia was induced.  Left upper chest, scalp, neck were prepped with betadine scrub and Duraprep.  Area of planned incision was infiltrated with lidocaine.  The scalp wound was reopened and the DBS cap and lead were exposed.  These were removed and multiple cultures were obtained.  The scalp was undermined to be able to close the scalp defect.  Posterior (post-auricular) scalp incision was made and the lead extension was exposed and the lead was cut. An incision was made in the left upper chest overlying the previously implanted implantable  pulse generator.The generator and extensions were removed.  All hardware was removed.  Wounds were irrigated with vancomycin.  Incisions were closed with 2-0 Vicryl and staples at the pocket and 2-0 vicryl at the scalp with staples.  We were able to close the scalp wound quite well with Vicryl sutures and staples.   Wounds were dressed with sterile occlusive dressings.  Counts were correct at the end of the case.  PLAN OF CARE: Admit to inpatient   PATIENT DISPOSITION:  PACU - hemodynamically stable.   Delay start of Pharmacological VTE agent (>24hrs) due to surgical blood loss or risk of bleeding: yes

## 2020-01-22 NOTE — H&P (Signed)
Patient ID:   [id number] Patient: Patrick Ross  Date of Birth: 11-18-1945 Visit Type: Office Visit   Date: 01/21/2020 11:00 AM Provider: Marchia Meiers. Vertell Limber MD   This 74 year old male presents for wound check.  HISTORY OF PRESENT ILLNESS: 1.  wound check  Patient returns and I request after speaking with his dermatologist last week.  Heavy Pseudomonas was shown on wound culture from 2 weeks ago.  Topical steroid was stopped by the dermatologist and gentamicin topically was initiated last week. Unfortunately, inspection today reveals a larger wound than was present at his last visit.  Opening diameter now of approximately 3 mm with implanted hardware visible.  No surrounding erythema or drainage.  Gentamicin ointment is visible.      Medical/Surgical/Interim History Reviewed, no change.     Family History: Reviewed, no changes.    Social History: Reviewed, no changes.   MEDICATIONS: (added, continued or stopped this visit) Started Medication Directions Instruction Stopped  amantadine HCl 100 mg tablet take 1 tablet by oral route 2 times every day    carbidopa 25 mg-levodopa 100 mg tablet take 1 tablet by oral route 3 times every day    carbidopa ER 25 mg-levodopa 100 mg tablet,extended release take 1 tablet by oral route 2 times every day   12/26/2019 doxycycline monohydrate 100 mg tablet take 1 tablet by oral route 2 times every day x 2weeks    Fortesta 10 mg/0.5 gram/actuation transdermal gel pump apply (40MG )  by topical route  every day in the morning to the front and inside area of the thighs    gabapentin 300 mg capsule take 1 capsule by oral route 3 times every day   01/09/2019 hydrocodone 5 mg-acetaminophen 325 mg tablet take 1 tablet by oral route  every 6 hours as needed for pain    Ibuprofen IB 200 mg tablet take 4 tablet by oral route  every 8 hours as needed with food   01/09/2019 Keflex 250 mg capsule take 1 capsule by oral  route  every 8 hours    Synthroid 25 mcg tablet take 1 tablet by oral route  every day    Tylenol 325 mg tablet take 1 tablet by oral route  every 4 hours as needed      ALLERGIES: Ingredient Reaction Medication Name Comment SULFA (SULFONAMIDE ANTIBIOTICS)    ADHESIVE     Reviewed, no changes.    PHYSICAL EXAM:  Vitals Date Temp F BP Pulse Ht In Wt Lb BMI BSA Pain Score 01/21/2020  174/107 87 74 230.6 29.61  3/10     IMPRESSION:  Jourdin Connors returns, in good spirits.  Unfortunately, his wound dehiscence has increased over the past 2 weeks.  This and the discovery of heavy Pseudomonas presents, he is at ever increasing risk of infection moving into his brain.  PLAN: Concerns are discussed with patient and his wife.  They agree to proceed with removal of left deep brain stimulator electrode, extension, and pulse generator.  We will plan to do this tomorrow at [facility].    Assessment/Plan  # Detail Type Description  1. 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     2. Assessment Laceration of skin of scalp, subsequent encounter (S01.01XD).                   Provider:  Marchia Meiers. Vertell Limber MD  01/21/2020 11:42 AM    Dictation edited by: Mike Craze. Poteat RN    CC Providers: Legrand Como  [facility] [address]  [place]  [address]-   Rebecca Tat  [address] [place], [place] [address]               Electronically signed by Marchia Meiers. Vertell Limber MD on 01/21/2020 05:18 PM

## 2020-01-22 NOTE — Anesthesia Postprocedure Evaluation (Signed)
Anesthesia Post Note  Patient: Patrick Ross  Procedure(s) Performed: REMOVAL OF LEFT BRAIN DEEP BRAIN STIMULATOR ELECTRODE, EXTENSION, AND PULSE GENERATOR (N/A Head)     Patient location during evaluation: PACU Anesthesia Type: General Level of consciousness: awake and alert Pain management: pain level controlled Vital Signs Assessment: post-procedure vital signs reviewed and stable Respiratory status: spontaneous breathing, nonlabored ventilation, respiratory function stable and patient connected to nasal cannula oxygen Cardiovascular status: blood pressure returned to baseline and stable Postop Assessment: no apparent nausea or vomiting Anesthetic complications: no   No complications documented.  Last Vitals:  Vitals:   01/22/20 1615 01/22/20 1629  BP: (!) 143/85 (!) (P) 143/79  Pulse: 87 (P) 88  Resp: (!) 21   Temp:    SpO2: 95% (P) 98%    Last Pain:  Vitals:   01/22/20 1629  TempSrc:   PainSc: (P) 2                  Cameron Schwinn S

## 2020-01-23 ENCOUNTER — Encounter (HOSPITAL_COMMUNITY): Payer: Self-pay | Admitting: Neurosurgery

## 2020-01-23 ENCOUNTER — Inpatient Hospital Stay: Payer: Self-pay

## 2020-01-23 ENCOUNTER — Telehealth: Payer: Self-pay | Admitting: Neurology

## 2020-01-23 DIAGNOSIS — M8668 Other chronic osteomyelitis, other site: Secondary | ICD-10-CM | POA: Diagnosis not present

## 2020-01-23 DIAGNOSIS — B965 Pseudomonas (aeruginosa) (mallei) (pseudomallei) as the cause of diseases classified elsewhere: Secondary | ICD-10-CM

## 2020-01-23 DIAGNOSIS — G4733 Obstructive sleep apnea (adult) (pediatric): Secondary | ICD-10-CM | POA: Diagnosis not present

## 2020-01-23 DIAGNOSIS — G20 Parkinson's disease: Secondary | ICD-10-CM

## 2020-01-23 DIAGNOSIS — F419 Anxiety disorder, unspecified: Secondary | ICD-10-CM

## 2020-01-23 DIAGNOSIS — M5416 Radiculopathy, lumbar region: Secondary | ICD-10-CM

## 2020-01-23 DIAGNOSIS — S0100XA Unspecified open wound of scalp, initial encounter: Secondary | ICD-10-CM | POA: Diagnosis not present

## 2020-01-23 DIAGNOSIS — T8469XA Infection and inflammatory reaction due to internal fixation device of other site, initial encounter: Secondary | ICD-10-CM | POA: Diagnosis not present

## 2020-01-23 DIAGNOSIS — K219 Gastro-esophageal reflux disease without esophagitis: Secondary | ICD-10-CM

## 2020-01-23 DIAGNOSIS — E039 Hypothyroidism, unspecified: Secondary | ICD-10-CM

## 2020-01-23 MED ORDER — CEFEPIME IV (FOR PTA / DISCHARGE USE ONLY): 2.0000 g | Freq: Three times a day (TID) | INTRAVENOUS | 0 refills | Status: AC

## 2020-01-23 MED ORDER — CHLORHEXIDINE GLUCONATE CLOTH 2 % EX PADS
6.0000 | MEDICATED_PAD | Freq: Every day | CUTANEOUS | Status: DC
  Administered 2020-01-23: 6 via TOPICAL

## 2020-01-23 MED ORDER — SODIUM CHLORIDE 0.9% FLUSH: 10.0000 mL | INTRAVENOUS | Status: DC | PRN

## 2020-01-23 NOTE — TOC Initial Note (Signed)
Transition of Care [facility]) - Initial/Assessment Note    Patient Details  Name: Patrick Ross MRN: [id number] Date of Birth: April 26, 1945  [facility] CM/SW Contact:    Benard Halsted, LCSW Phone Number: 01/23/2020, 4:38 PM  Clinical Narrative:                 CSW spoke with patient and spouse at bedside. They are requesting to go home with home health services at discharge rather than SNF. [facility] and they have accepted patient with a start of care tomorrow for IV antibiotics. Pam with [facility] has arranged IV infusion at home. No other needs identified for patient.  Expected Discharge Plan: [place] Barriers to Discharge: No Barriers Identified   Patient Goals and CMS Choice   CMS Medicare.gov Compare Post Acute Care list provided to:: Patient Choice offered to / list presented to : Patient, Spouse  Expected Discharge Plan and Services Expected Discharge Plan: [place] In-house Referral: Clinical Social Work Discharge Planning Services: CM Consult Post Acute Care Choice: Home Health, Durable Medical Equipment Living arrangements for the past 2 months: [place] Expected Discharge Date: 01/23/20               DME Arranged: IV pump/equipment DME Agency:  [facility]) Date DME Agency Contacted: 01/23/20 Time DME Agency Contacted: [phone] Representative spoke with at DME Agency: [place]: RN, PT, OT, Nurse's Aide, Social Work [employer] Agency: [place] ([place]) Date [place]: 01/23/20 Time [place]: 1636 Representative spoke with at [place]: [place] Arrangements/Services Living arrangements for the past 2 months: [place] with:: Spouse Patient language and need for interpreter reviewed:: Yes Do you feel safe going back to the place where you live?: Yes      Need for Family Participation in Patient Care: Yes (Comment) Care  giver support system in place?: Yes (comment)   Criminal Activity/Legal Involvement Pertinent to Current Situation/Hospitalization: No - Comment as needed  Activities of Daily Living Home Assistive Devices/Equipment: Eyeglasses, Cane (specify quad or straight), Walker (specify type), Wheelchair, Raised toilet seat with rails, Shower chair with back, CPAP, Blood pressure cuff, Grab bars in shower, Hand-held shower hose ADL Screening (condition at time of admission) Patient's cognitive ability adequate to safely complete daily activities?: Yes Is the patient deaf or have difficulty hearing?: Yes Does the patient have difficulty seeing, even when wearing glasses/contacts?: No Does the patient have difficulty concentrating, remembering, or making decisions?: No Patient able to express need for assistance with ADLs?: Yes Does the patient have difficulty dressing or bathing?: No Independently performs ADLs?: Yes (appropriate for developmental age) Does the patient have difficulty walking or climbing stairs?: No Weakness of Legs: Both Weakness of Arms/Hands: None  Permission Sought/Granted Permission sought to share information with : Facility Sport and exercise psychologist, Family Supports Permission granted to share information with : Yes, Verbal Permission Granted              Emotional Assessment Appearance:: Appears stated age Attitude/Demeanor/Rapport: Engaged Affect (typically observed): Accepting, Appropriate Orientation: : Oriented to Self, Oriented to Place, Oriented to  Time, Oriented to Situation Alcohol / Substance Use: Not Applicable Psych Involvement: No (comment)  Admission diagnosis:  Open scalp wound [S01.00XA] Patient Active Problem List   Diagnosis Date Noted  . Open scalp wound 01/22/2020  . 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[place])   . 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  Parkinson's disease ([place]) 01/16/2018  . 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

## 2020-01-23 NOTE — Progress Notes (Signed)
Inpatient Rehab Admissions Coordinator Note:   Per therapy recommendations, pt was screened for CIR candidacy by Shann Medal, PT, DPT.  At this time pt already mobilizing with min assist or better.  Discussed with rehab MD, Dr. Posey Pronto, who feels pt not likely to require intensity of CIR.  Will not request a consult at this time.  Please contact me with questions.   Shann Medal, PT, DPT [phone] 01/23/20 10:54 AM

## 2020-01-23 NOTE — Progress Notes (Signed)
Peripherally Inserted Central Catheter Placement  The IV Nurse has discussed with the patient and/or persons authorized to consent for the patient, the purpose of this procedure and the potential benefits and risks involved with this procedure.  The benefits include less needle sticks, lab draws from the catheter, and the patient may be discharged home with the catheter. Risks include, but not limited to, infection, bleeding, blood clot (thrombus formation), and puncture of an artery; nerve damage and irregular heartbeat and possibility to perform a PICC exchange if needed/ordered by physician.  Alternatives to this procedure were also discussed.  Bard Power PICC patient education guide, fact sheet on infection prevention and patient information card has been provided to patient /or left at bedside.    PICC Placement Documentation  PICC Single Lumen 01/23/20 PICC Right Cephalic 39 cm 0 cm (Active)  Indication for Insertion or Continuance of Line Home intravenous therapies (PICC only) 01/23/20 1000  Exposed Catheter (cm) 0 cm 01/23/20 1000  Site Assessment Clean;Dry;Intact 01/23/20 1000  Line Status Flushed;Blood return noted;Saline locked 01/23/20 1000  Dressing Type Transparent 01/23/20 1000  Dressing Status Dry;Clean;Intact 01/23/20 1000  Antimicrobial disc in place? Yes 01/23/20 1000  Dressing Change Due 01/30/20 01/23/20 1000       Scotty Court 01/23/2020, 10:40 AM

## 2020-01-23 NOTE — Progress Notes (Signed)
Patient alert and oriented, mae's well, voiding adequate amount of urine, swallowing without difficulty, no c/o pain at time of discharge. Patient discharged home with spouse. Home antibiotics discharged instructions given to patient. Dressing changed prior to discharged.Patient and spouse stated understanding of instructions given. Patient has an appointment with Dr. Vertell Limber and infection control MD

## 2020-01-23 NOTE — Telephone Encounter (Signed)
I advised to patient that Dr.Tat was out of the office till Monday. Will forward message to her.

## 2020-01-23 NOTE — Evaluation (Signed)
Occupational Therapy Evaluation Patient Details Name: Patrick Ross MRN: [id number] DOB: [birth year] Today's Date: 01/23/2020    History of Present Illness Patient is a 74 y/o male presenting for removal of L brain deep brain stimulator electrode, extension and pulse generator due to worsening wound dehiscence and pseudomonas aeruginosa infection over the past 2 weeks.  PMH includes: HTN, lumbar stenosis, depression, parkinsons disease, urinary incontinence, REM behavioral disorder.    Clinical Impression   PTA patient reports using walker for mobility with independence, ADLs with assist for LB but otherwise independent; spouse assisting with IADLs. Admitted for above and limited by problem list below, including impaired balance, decreased activity tolerance, generalized weakness, decreased coordination and impaired cognition (processing, problem solving, safety awareness).  Patient currently requires supervision for bed mobility, min guard for basic transfers, and up to mod-max assist for ADLs.  He is at high risk for fall due to weakness and freezing episodes, requiring max verbal cues at times; limited in room mobility completed with up to min assist for mobility using RW.  Recommend continued OT services while admitted and after dc at CIR level to optimize independence, safety and to decrease burden of care at dc in hopes to return to modified independent level.  Will follow.      Follow Up Recommendations  CIR;Supervision/Assistance - 24 hour    Equipment Recommendations  3 in 1 bedside commode    Recommendations for Other Services Rehab consult     Precautions / Restrictions Precautions Precautions: Fall Restrictions Weight Bearing Restrictions: No      Mobility Bed Mobility Overal bed mobility: Needs Assistance Bed Mobility: Supine to Sit     Supine to sit: Supervision Sit to supine: Supervision   General bed mobility comments: use of rails, increased time and effort  with verbal cueing     Transfers Overall transfer level: Needs assistance Equipment used: Rolling walker (2 wheeled) Transfers: Sit to/from Stand Sit to Stand: Min guard         General transfer comment: for safety and balance, verbal cueing for technique    Balance Overall balance assessment: Needs assistance Sitting-balance support: No upper extremity supported;Feet supported Sitting balance-Leahy Scale: Poor Sitting balance - Comments: min assist to min guard for safety/balance Postural control: Right lateral lean Standing balance support: Bilateral upper extremity supported;During functional activity Standing balance-Leahy Scale: Poor Standing balance comment: relies on BUE support and intermittent min assist with posterior lean                           ADL either performed or assessed with clinical judgement   ADL Overall ADL's : Needs assistance/impaired     Grooming: Minimal assistance;Sitting   Upper Body Bathing: Set up;Supervision/ safety;Sitting   Lower Body Bathing: Moderate assistance;Sit to/from stand   Upper Body Dressing : Set up;Sitting   Lower Body Dressing: Sit to/from stand;Maximal assistance Lower Body Dressing Details (indicate cue type and reason): would require assist with socks/shoes, min guard sit to stand but relies on BUE support Toilet Transfer: Ambulation;RW;Min guard Toilet Transfer Details (indicate cue type and reason): simulated in room         Functional mobility during ADLs: Minimal assistance;Rolling walker;Cueing for safety;Cueing for sequencing General ADL Comments: pt limited by decreased activity tolerance, impaired balance, and coordination      Vision   Vision Assessment?: No apparent visual deficits     Perception     Praxis  Pertinent Vitals/Pain Pain Assessment: Faces Faces Pain Scale: No hurt     Hand Dominance Right   Extremity/Trunk Assessment Upper Extremity Assessment Upper Extremity  Assessment: Generalized weakness   Lower Extremity Assessment Lower Extremity Assessment: Defer to PT evaluation   Cervical / Trunk Assessment Cervical / Trunk Assessment: Kyphotic   Communication Communication Communication:  (intermittent mumbled speech)   Cognition Arousal/Alertness: Awake/alert Behavior During Therapy: Flat affect Overall Cognitive Status: Impaired/Different from baseline Area of Impairment: Safety/judgement;Awareness;Problem solving                         Safety/Judgement: Decreased awareness of deficits;Decreased awareness of safety Awareness: Emergent Problem Solving: Slow processing;Decreased initiation;Difficulty sequencing;Requires verbal cues General Comments: pt with slow processing and decreased initation; decreased awareness of safety and current deficts    General Comments  VSS on RA    Exercises     Shoulder Instructions      Home Living Family/patient expects to be discharged to:: Private residence Living Arrangements: Spouse/significant other Available Help at Discharge: Family;Available 24 hours/day Type of Home: House Home Access: Ramped entrance     Home Layout: Two level;Bed/bath upstairs Alternate Level Stairs-Number of Steps: 20 Alternate Level Stairs-Rails: Right (partial left rail) Bathroom Shower/Tub: Hospital doctor Toilet: Handicapped height     Home Equipment: Environmental consultant - 2 wheels;Wheelchair - manual;Cane - single point;Shower seat Radiographer, therapeutic (similar to rollator, designes for individuals w/ PD))          Prior Functioning/Environment Level of Independence: Needs assistance  Gait / Transfers Assistance Needed: ambulates independently with MachE downstairs, RW upstairs, recently walked a quarter mile with 2 canes outdoors ADL's / Homemaking Assistance Needed: pt requires assistance with lower body dressing, otherwise independent with ADLs; spouse completes IADLs             OT Problem List:  Decreased strength;Decreased activity tolerance;Impaired balance (sitting and/or standing);Decreased coordination;Decreased cognition;Decreased safety awareness;Decreased knowledge of use of DME or AE;Decreased knowledge of precautions      OT Treatment/Interventions: Self-care/ADL training;Energy conservation;DME and/or AE instruction;Therapeutic activities;Balance training;Patient/family education;Neuromuscular education    OT Goals(Current goals can be found in the care plan section) Acute Rehab OT Goals Patient Stated Goal: To go home and return to prior level of function OT Goal Formulation: With patient Time For Goal Achievement: 02/06/20 Potential to Achieve Goals: Good  OT Frequency: Min 2X/week   Barriers to D/C:            Co-evaluation              AM-PAC OT "6 Clicks" Daily Activity     Outcome Measure Help from another person eating meals?: A Little Help from another person taking care of personal grooming?: A Little Help from another person toileting, which includes using toliet, bedpan, or urinal?: A Lot Help from another person bathing (including washing, rinsing, drying)?: A Little Help from another person to put on and taking off regular upper body clothing?: A Little Help from another person to put on and taking off regular lower body clothing?: A Lot 6 Click Score: 16   End of Session Equipment Utilized During Treatment: Gait belt;Rolling walker Nurse Communication: Mobility status;Precautions  Activity Tolerance: Patient tolerated treatment well Patient left: with call bell/phone within reach;Other (comment);with family/visitor present (seated EOB, PT present )  OT Visit Diagnosis: Other abnormalities of gait and mobility (R26.89);Muscle weakness (generalized) (M62.81)  Time: 0510-7125 OT Time Calculation (min): 31 min Charges:  OT General Charges $OT Visit: 1 Visit OT Evaluation $OT Eval Moderate Complexity: 1 Mod OT  Treatments $Self Care/Home Management : 8-22 mins  Jolaine Artist, OT Acute Rehabilitation Services Pager [phone] Office [phone]   Delight Stare 01/23/2020, 10:28 AM

## 2020-01-23 NOTE — Progress Notes (Signed)
[facility]  Date of Admission:  01/22/2020      Abx: 11/23-c cefepime  Prior to admission doxycycline and topical gentamycin                                                     Assessment: 74 yo male OSA, anxiety, GERD, hypothyroidism, lumbar radiculopathy s/p lumbar fusion 2014,  parkinson's disease, s/p right deep brain stimulator placement 11/2011 and left DBS 62/8315, complicated by displacement of left DBS needing revision  01/2013 and again 12/2018, and most recently soft tissue infection with exposed electrode of the left DBS with pseudomonas admitted 11/23 for semi-elective I&D and s/p hardware removal.   Of note he had been receiving doxycycline and getting topical gentamycin from his dermatology office for the soft tissue infection process  Given the exposed hardware, which connects through bone into the cns area, will treat as Osteomyelitis which will cover for any potential nosocomial meningitis  No recent cns imaging but doesn't appear to exhibit intracranial s/s of meningitis/encephalitis  11/24 id f/u Pseudomonas growing from wound cx Patient clinically doing well on cefepime, pending discharge home today  I don't have his prior wound swab culture yet; had asked dr Vertell Limber yesterday; will request again once he is in clinic with [place]  Plan: 1. opat below. Cefepime 2 gram iv q8hour 6 weeks 11/24-1/06/2020 2. F/u with me in 3 weeks; appointment below   OPAT Order Details            .Outpatient Parenteral Antibiotic Therapy Consult  Until discontinued       Provider:  (Not yet assigned)  Question Answer Comment  Antibiotic Cefepime (Maxipime) IVPB   Indications for use osteomyelitis/CNS infection   End Date 03/05/2020                Appointment date and time: 12/16 @ 1045    Labs Monitor (check all applied): _x_ weekly cbc, cmp, crp __ weekly vancomycin __ trough or __ predialysis level __ other TDM     [facility] address: [facility] Located in: [facility] Address: [address], [place], [place] [address] Phone: [phone]  Active Problems:   Open scalp wound   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[place])   Scheduled Meds: . amantadine  100 mg Oral BID  . carbidopa-levodopa  1 tablet Oral 5 X Daily  . Chlorhexidine Gluconate Cloth  6 each Topical Daily  . clonazePAM  1.5 mg Oral QHS  . docusate sodium  100 mg Oral BID  . escitalopram  10 mg Oral QHS  . famotidine  20 mg Oral BID  . levothyroxine  125 mcg Oral Daily  . lisinopril  20 mg Oral Daily  . mirabegron ER  25 mg Oral QHS  . sodium chloride flush  3 mL Intravenous Q12H   Continuous Infusions: . sodium chloride 250 mL (01/22/20 1810)  . ceFEPime (MAXIPIME) IV 2 g (01/23/20 0511)  . dextrose 5 % and 0.45 % NaCl with KCl 20 mEq/L     PRN Meds:.acetaminophen **OR** acetaminophen, alum & mag hydroxide-simeth, bisacodyl, HYDROcodone-acetaminophen, HYDROcodone-acetaminophen, menthol-cetylpyridinium **OR** phenol, ondansetron **OR** ondansetron (ZOFRAN) IV, polyethylene glycol, sodium chloride flush, sodium chloride flush, sodium phosphate   SUBJECTIVE: Doing well No headache, n/v/diarrhea, rash Disposition in plan  Review of Systems: ROS Negative 11 point ros unless mentioned above  Allergies  Allergen Reactions  . Other Other (See Comments)    Surgical spray prior to adhesive causes blisters  . Sulfamethoxazole Other (See Comments)    Unsure--childhood allergy    OBJECTIVE: Vitals:   01/22/20 2014 01/22/20 2323 01/23/20 0330 01/23/20 0757  BP: (!) 157/102 (!) 142/89 118/64 (!) 153/93  Pulse: 97 94 86 81  Resp: 16 18 18 16   Temp: (!) 97.5 F (36.4 C) 97.9 F (36.6 C) 97.6 F (36.4 C) 98.2 F (36.8 C)  TempSrc: Oral Oral Oral   SpO2: 97% 96% 97% 99%  Weight:      Height:       Body mass index is 30.34 kg/m.  Physical Exam No distress, conversant, follows  commands Left head shaved; wound dressign clean/dry; conj clear; eomi Neck supple cv rrr no mrg Lungs clear; normal respiratory effort abd s/nt Ext no edema Skin no rash Neuro nonfocal Psych alert/oriented  Lab Results Lab Results  Component Value Date   WBC 5.0 01/22/2020   HGB 16.3 01/22/2020   HCT 50.0 01/22/2020   MCV 94.9 01/22/2020   PLT 155 01/22/2020    Lab Results  Component Value Date   CREATININE 0.86 01/22/2020   BUN 15 01/22/2020   NA 140 01/22/2020   K 4.1 01/22/2020   CL 105 01/22/2020   CO2 24 01/22/2020    Lab Results  Component Value Date   ALT 27 04/12/2008   AST 32 04/12/2008   ALKPHOS 75 04/12/2008   BILITOT 1.2 04/12/2008     Microbiology: Recent Results (from the past 240 hour(s))  SARS Coronavirus 2 by RT PCR (hospital order, performed in [facility] hospital lab) Nasopharyngeal Nasopharyngeal Swab     Status: None   Collection Time: 01/22/20 10:23 AM   Specimen: Nasopharyngeal Swab  Result Value Ref Range Status   SARS Coronavirus 2 NEGATIVE NEGATIVE Final    Comment: (NOTE) SARS-CoV-2 target nucleic acids are NOT DETECTED.  The SARS-CoV-2 RNA is generally detectable in upper and lower respiratory specimens during the acute phase of infection. The lowest concentration of SARS-CoV-2 viral copies this assay can detect is 250 copies / mL. A negative result does not preclude SARS-CoV-2 infection and should not be used as the sole basis for treatment or other patient management decisions.  A negative result may occur with improper specimen collection / handling, submission of specimen other than nasopharyngeal swab, presence of viral mutation(s) within the areas targeted by this assay, and inadequate number of viral copies (<250 copies / mL). A negative result must be combined with clinical observations, patient history, and epidemiological information.  Fact Sheet for Patients:   StrictlyIdeas.no  Fact Sheet  for Healthcare Providers: BankingDealers.co.za  This test is not yet approved or  cleared by the [place] FDA and has been authorized for detection and/or diagnosis of SARS-CoV-2 by FDA under an Emergency Use Authorization (EUA).  This EUA will remain in effect (meaning this test can be used) for the duration of the COVID-19 declaration under Section 564(b)(1) of the Act, 21 U.S.C. section 360bbb-3(b)(1), unless the authorization is terminated or revoked sooner.  Performed at [facility], [place] [address], [place], [place] [address]   Aerobic/Anaerobic Culture (surgical/deep wound)     Status: None (Preliminary result)   Collection Time: 01/22/20  2:41 PM   Specimen: Soft Tissue, Other  Result Value Ref Range Status   Specimen Description WOUND  Final   Special Requests LEFT BRAIN SITE PT ON ANCEF  Final   Gram Stain   Final    ABUNDANT WBC PRESENT, PREDOMINANTLY PMN NO ORGANISMS SEEN    Culture   Final    FEW PSEUDOMONAS AERUGINOSA SUSCEPTIBILITIES TO FOLLOW Performed at [facility], [place] [address] [place], [place] [address]    Report Status PENDING  Incomplete    Serology: n/a  Jabier Mutton, [place] for [place] [phone] pager    01/23/2020, 11:23 AM

## 2020-01-23 NOTE — Care Management CC44 (Signed)
Condition Code 44 Documentation Completed  Patient Details  Name: BRAYLYNN GHAN MRN: [id number] Date of Birth: 25-Aug-1945   Condition Code 44 given:  Yes Patient signature on Condition Code 44 notice:  Yes Documentation of 2 MD's agreement:  Yes Code 44 added to claim:  Yes    Benard Halsted, LCSW 01/23/2020, 4:33 PM

## 2020-01-23 NOTE — Discharge Summary (Signed)
Physician Discharge Summary  Patient ID: Patrick Ross MRN: [id number] DOB/AGE: 1945-04-06 74 y.o.  Admit date: 01/22/2020 Discharge date: 01/23/2020  Admission Diagnoses: Open scalp wound over deep brain stimulator electrode on the left  Discharge Diagnoses: Open scalp wound over deep brain stimulator electrode on the left Active Problems:   Open scalp wound   Chronic osteomyelitis [facility])   Discharged Condition: good  Hospital Course: The patient was admitted on 01/22/2020 and taken to the operating room where the patient underwent removal of left deep brain stimulator electrode, extension, and pulse generator. The patient tolerated the procedure well and was taken to the recovery room and then to the floor in stable condition. The hospital course was routine. There were no complications. The wound remained clean dry and intact. Pt had appropriate incisional soreness. No complaints of neurological deficits. The patient remained afebrile with stable vital signs, and tolerated a regular diet. The patient continued to increase activities, and pain was well controlled with oral pain medications. PT and OT recommended CIR.  However, the patient's insurance denied CIR and the patient declined CIR.    Consults: ID  Significant Diagnostic Studies: None  Treatments: surgery: REMOVAL OF LEFT BRAIN DEEP BRAIN STIMULATOR ELECTRODE, EXTENSION, AND PULSE GENERATOR  Discharge Exam: Blood pressure (!) 153/93, pulse 81, temperature 98.2 F (36.8 C), resp. rate 16, height 6\' 1"  (1.854 m), weight 104.3 kg, SpO2 99 %.  Physical Exam: Awake,A/O X 4, and conversant. Patient is doing welloverall and is in good spirits. MAEW with good strength that is symmetric bilaterally. Dressings are intact with old drainage. Incisions are well approximated with no drainage, erythema, or edema.  Disposition: Home / Self care   Allergies as of 01/23/2020      Reactions   Other Other (See Comments)   Surgical  spray prior to adhesive causes blisters   Sulfamethoxazole Other (See Comments)   Unsure--childhood allergy      Medication List    TAKE these medications   amantadine 100 MG capsule Commonly known as: SYMMETREL Take 1 capsule (100 mg total) by mouth 2 (two) times daily.   carbidopa-levodopa 25-100 MG tablet Commonly known as: SINEMET IR Take 1 tablet by mouth 5 (five) times daily.   clonazePAM 0.5 MG tablet Commonly known as: KLONOPIN 1.5 tabs at bed What changed:   how much to take  how to take this  when to take this  additional instructions   escitalopram 10 MG tablet Commonly known as: LEXAPRO Take 10 mg by mouth at bedtime.   famotidine 20 MG tablet Commonly known as: PEPCID Take 20 mg by mouth 2 (two) times daily.   Fortesta 10 MG/ACT (2%) Gel Generic drug: Testosterone Place 6 Pump onto the skin daily. 3 pump on each inner thigh   gentamicin ointment 0.1 % Commonly known as: GARAMYCIN Apply 1 application topically 3 (three) times daily.   Inbrija 42 MG Caps Generic drug: Levodopa Place 2 capsules into inhaler and inhale 5 (five) times daily as needed (up to 5 times a day as needed). What changed: reasons to take this   levothyroxine 125 MCG tablet Commonly known as: SYNTHROID Take 125 mcg by mouth daily.   lisinopril 20 MG tablet Commonly known as: ZESTRIL Take 20 mg by mouth daily.   Myrbetriq 25 MG Tb24 tablet Generic drug: mirabegron ER Take 25 mg by mouth at bedtime.   OVER THE COUNTER MEDICATION Take 2 tablets by mouth 2 (two) times daily. Alpha CRS otc supplement  OVER THE COUNTER MEDICATION Take 2 capsules by mouth 2 (two) times daily. Microplex VMz otc supplement   OVER THE COUNTER MEDICATION Take 2 capsules by mouth 2 (two) times daily. XEOmega 3 oil complex   OVER THE COUNTER MEDICATION Take 1 tablet by mouth daily. onguard+ otc supplement   OVER THE COUNTER MEDICATION Take 3 capsules by mouth at bedtime. terrazyme  supplement   OVER THE COUNTER MEDICATION Take 1 tablet by mouth 2 (two) times daily. Bone nutrient otc supplement   OVER THE COUNTER MEDICATION Take 1 capsule by mouth at bedtime. PB Assist pro/prebiotic   TURMERIC PO Take 1 tablet by mouth daily.        Signed: Marvis Moeller, DNP, NP-C 01/23/2020, 8:45 AM

## 2020-01-23 NOTE — Progress Notes (Signed)
CSW received request for home IV infusion for patient. CSW sent referral to [facility] with [facility]. She will need OPAT orders by 12pm today to obtain medication for home, otherwise will not be able to obtain until Friday; RN aware.   Ileane Sando LCSW

## 2020-01-23 NOTE — Progress Notes (Signed)
Subjective: Patient reports that he is doing well and was able to get some sleep last night. No acute events reported overnight.   Objective: Vital signs in last 24 hours: Temp:  [97 F (36.1 C)-98.2 F (36.8 C)] 98.2 F (36.8 C) (11/24 0757) Pulse Rate:  [81-97] 81 (11/24 0757) Resp:  [13-21] 16 (11/24 0757) BP: (118-176)/(64-105) 153/93 (11/24 0757) SpO2:  [94 %-100 %] 99 % (11/24 0757) Weight:  [104.3 kg] 104.3 kg (11/23 1035)  Intake/Output from previous day: 11/23 0701 - 11/24 0700 In: 1600 [I.V.:1400; IV Piggyback:200] Out: 325 [Urine:250; Blood:75] Intake/Output this shift: No intake/output data recorded.  Physical Exam: Awake, A/O X 4, and conversant. Patient is doing well overall and is in good spirits. MAEW with good strength that is symmetric bilaterally.  Dressings are intact with old drainage.  Incisions are well approximated with no drainage, erythema, or edema.  Lab Results: Recent Labs    01/22/20 1109  WBC 5.0  HGB 16.3  HCT 50.0  PLT 155   BMET Recent Labs    01/22/20 1109  NA 140  K 4.1  CL 105  CO2 24  GLUCOSE 95  BUN 15  CREATININE 0.86  CALCIUM 9.0    Studies/Results: [place] EKG SITE RITE  Result Date: 01/23/2020 If Site Rite image not attached, placement could not be confirmed due to current cardiac rhythm.   Assessment/Plan: The patient is postop day 1 S/P removal of left deep brain stimulator electrode, extension, and pulse generator. The patient is recovering well.  Plan for PICC line insertion today.  Will discharge patient once Infectious Disease has placed recommendations on antibiotic treatment and duration.    LOS: 1 day     Marvis Moeller, DNP, NP-C 01/23/2020, 8:36 AM

## 2020-01-23 NOTE — Evaluation (Signed)
Physical Therapy Evaluation Patient Details Name: Patrick Ross MRN: [id number] DOB: Jul 21, 1945 Today's Date: 01/23/2020   History of Present Illness  Patient is a 74 y/o male presenting for removal of L brain deep brain stimulator electrode, extension and pulse generator due to worsening wound dehiscence and pseudomonas aeruginosa infection over the past 2 weeks.  PMH includes: HTN, lumbar stenosis, depression, parkinsons disease, urinary incontinence, REM behavioral disorder.   Clinical Impression  Pt presents to PT with deficits in functional mobility, gait, balance, power, strength, endurance, coordination. Pt currently requires physical assistance to perform all out of bed mobility and fatigues very quickly with mobility. Pt has no bedrooms on the 1st level of his home and must ascend 20 steps to reach the 2nd level. Currently the pt is at a high risk for falls due to endurance deficits, freezing episodes, and impaired awareness of his current deficits. Pt often reports that negotiating stairs and ambulating are not difficult for him at home and PT has to remind the patient that things may be different after DBS removal. PT recommends CIR placement at this time as the pt demonstrates the potential to make significant functional gains and potentially return to a modI level of mobility with high intensity inpatient therapies.    Follow Up Recommendations CIR (HHPT if pt refuses CIR)    Equipment Recommendations  3in1 (PT)    Recommendations for Other Services Rehab consult     Precautions / Restrictions Precautions Precautions: Fall Restrictions Weight Bearing Restrictions: No      Mobility  Bed Mobility Overal bed mobility: Needs Assistance Bed Mobility: Supine to Sit;Sit to Supine     Supine to sit: Supervision Sit to supine: Supervision   General bed mobility comments: use of rails    Transfers Overall transfer level: Needs assistance Equipment used: Rolling walker (2  wheeled) Transfers: Sit to/from Stand Sit to Stand: Min guard            Ambulation/Gait Ambulation/Gait assistance: Min Web designer (Feet): 20 Feet Assistive device: Rolling walker (2 wheeled) Gait Pattern/deviations: Step-to pattern;Shuffle Gait velocity: reduced Gait velocity interpretation: <1.31 ft/sec, indicative of household ambulator General Gait Details: pt with short step to gait, reduced foot clearance, multiple periods of freezing, pt with mild left lateral trunk lean with fatigue  Stairs            Wheelchair Mobility    Modified Rankin (Stroke Patients Only)       Balance Overall balance assessment: Needs assistance Sitting-balance support: No upper extremity supported;Feet supported Sitting balance-Leahy Scale: Poor Sitting balance - Comments: intermittent minA with dynamic sitting balance Postural control: Right lateral lean Standing balance support: Single extremity supported;Bilateral upper extremity supported Standing balance-Leahy Scale: Poor Standing balance comment: reliant on UE support of RW or railing and intermittent minA                             Pertinent Vitals/Pain Pain Assessment: Faces Faces Pain Scale: No hurt    Home Living Family/patient expects to be discharged to:: Private residence Living Arrangements: Spouse/significant other Available Help at Discharge: Family;Available 24 hours/day Type of Home: House Home Access: Ramped entrance     Home Layout: Two level;Bed/bath upstairs Home Equipment: Walker - 2 wheels;Wheelchair - manual;Cane - single point;Shower seat Radiographer, therapeutic (similar to rollator, designes for individuals w/ PD))      Prior Function Level of Independence: Needs assistance   Gait / Transfers Assistance  Needed: ambulates independently with MachE downstairs, RW upstairs, recently walked a quarter mile with 2 canes outdoors  ADL's / Homemaking Assistance Needed: pt requires assistance with  lower body dressing        Hand Dominance   Dominant Hand: Right    Extremity/Trunk Assessment   Upper Extremity Assessment Upper Extremity Assessment: Defer to OT evaluation    Lower Extremity Assessment Lower Extremity Assessment: Generalized weakness (ROM WFL)    Cervical / Trunk Assessment Cervical / Trunk Assessment: Kyphotic  Communication   Communication:  (intermittent mumbled speech)  Cognition Arousal/Alertness: Awake/alert Behavior During Therapy: Flat affect Overall Cognitive Status: Impaired/Different from baseline Area of Impairment: Safety/judgement;Awareness;Problem solving                         Safety/Judgement: Decreased awareness of deficits;Decreased awareness of safety Awareness: Emergent Problem Solving: Slow processing        General Comments General comments (skin integrity, edema, etc.): VSS on RA    Exercises     Assessment/Plan    PT Assessment Patient needs continued PT services  PT Problem List Decreased strength;Decreased activity tolerance;Decreased balance;Decreased coordination;Decreased mobility;Decreased knowledge of use of DME;Decreased safety awareness       PT Treatment Interventions DME instruction;Gait training;Stair training;Functional mobility training;Therapeutic activities;Balance training;Neuromuscular re-education;Patient/family education;Cognitive remediation;Therapeutic exercise    PT Goals (Current goals can be found in the Care Plan section)  Acute Rehab PT Goals Patient Stated Goal: To go home and return to prior level of function PT Goal Formulation: With patient/family Time For Goal Achievement: 02/06/20 Potential to Achieve Goals: Good    Frequency Min 3X/week   Barriers to discharge   2nd level bedrooms    Co-evaluation               AM-PAC PT "6 Clicks" Mobility  Outcome Measure Help needed turning from your back to your side while in a flat bed without using bedrails?:  None Help needed moving from lying on your back to sitting on the side of a flat bed without using bedrails?: None Help needed moving to and from a bed to a chair (including a wheelchair)?: A Little Help needed standing up from a chair using your arms (e.g., wheelchair or bedside chair)?: A Little Help needed to walk in hospital room?: A Little Help needed climbing 3-5 steps with a railing? : Total 6 Click Score: 18    End of Session   Activity Tolerance: Patient tolerated treatment well Patient left: in bed;with call bell/phone within reach;with bed alarm set;with family/visitor present Nurse Communication: Mobility status PT Visit Diagnosis: Other abnormalities of gait and mobility (R26.89);Other symptoms and signs involving the nervous system (R29.898)    Time: 7262-0355 PT Time Calculation (min) (ACUTE ONLY): 54 min   Charges:   PT Evaluation $PT Eval Moderate Complexity: 1 Mod PT Treatments $Gait Training: 8-22 mins $Therapeutic Activity: 8-22 mins        Zenaida Niece, PT, DPT Acute Rehabilitation Pager: [phone]   Zenaida Niece 01/23/2020, 9:46 AM

## 2020-01-23 NOTE — Telephone Encounter (Signed)
Patient had surgery yesterday, wife is calling to ask for patient if his medication needs to be changed? Patient reports he is having a hard time moving. He isn't having any symptoms like he's had before, like flailing. The hospital is wanting him to stay to have PT but patient is wanting to leave. Please call.

## 2020-01-23 NOTE — Discharge Instructions (Addendum)
Wound Care Leave incision open to air. You may shower. Do not scrub directly on incision.  Do not put any creams, lotions, or ointments on incision.  Activity  Walk each and every day, as much as you can do    Diet Resume your normal diet.   Call Your Doctor If Any of These Occur Redness, drainage, or swelling at the wound.  Temperature greater than 101 degrees. Severe pain not relieved by pain medication. Incision starts to come apart. Follow Up Appt Call  for any problem [phone]

## 2020-01-23 NOTE — Care Management Obs Status (Signed)
[facility] NOTIFICATION   Patient Details  Name: Patrick Ross MRN: [id number] Date of Birth: Aug 01, 1945   Medicare Observation Status Notification Given:  Yes    Benard Halsted, LCSW 01/23/2020, 4:33 PM

## 2020-01-23 NOTE — Progress Notes (Signed)
PHARMACY CONSULT NOTE FOR:  OUTPATIENT  PARENTERAL ANTIBIOTIC THERAPY (OPAT)  Indication: osteomyelitis/CNS infection Regimen: cefepime 2g IV q8hrs  End date: 03/05/2020  IV antibiotic discharge orders are pended. To discharging provider:  please sign these orders via discharge navigator,  Select New Orders & click on the button choice - Manage This Unsigned Work.     Thank you for allowing pharmacy to be a part of this patient's care.  Wilson Singer, PharmD PGY1 Pharmacy Resident 01/23/2020 11:16 AM

## 2020-01-27 LAB — AEROBIC/ANAEROBIC CULTURE W GRAM STAIN (SURGICAL/DEEP WOUND)

## 2020-01-27 NOTE — Telephone Encounter (Signed)
I'm aware of his surgery and its why I worked him into the clinic on tuesday

## 2020-01-29 ENCOUNTER — Encounter: Payer: Self-pay | Admitting: Neurology

## 2020-01-29 ENCOUNTER — Ambulatory Visit (INDEPENDENT_AMBULATORY_CARE_PROVIDER_SITE_OTHER): Payer: Medicare Other | Admitting: Neurology

## 2020-01-29 ENCOUNTER — Other Ambulatory Visit: Payer: Self-pay

## 2020-01-29 ENCOUNTER — Telehealth: Payer: Self-pay

## 2020-01-29 VITALS — BP 142/85 | HR 88 | Ht 75.0 in | Wt 230.0 lb

## 2020-01-29 DIAGNOSIS — G20 Parkinson's disease: Secondary | ICD-10-CM | POA: Diagnosis not present

## 2020-01-29 MED ORDER — CARBIDOPA-LEVODOPA 25-100 MG PO TABS
ORAL_TABLET | ORAL | 0 refills | Status: DC
Start: 2020-01-29 — End: 2020-02-01

## 2020-01-29 NOTE — Procedures (Signed)
DBS Programming was performed.    Manufacturer of DBS device: [employer]onic  Total time spent programming was 25  minutes.  Device was confirmed to be on.  Soft start was confirmed to be on.  Impedences were checked and were within normal limits.  Battery was checked and was determined to be functioning normally and not near the end of life.  Final settings were as follows with group A active:   Active Contact Amplitude (V) PW (ms) Frequency (hz) Side Effects Battery  Left Brain        Group A        12/20/19 3-C+ 3.1 70 140  2.89  01/29/20 N/A                       Group B        12/20/19 2-C+ 2.0 70 130            Right Brain        Group A        12/20/19 3-C+ 3.2 90 135  2.94  01/29/20 3-C+ 3.2 (2.4-3.6) 90 135  2.94

## 2020-01-29 NOTE — Telephone Encounter (Signed)
I'm not sure what they are needing but the patient has an appointment today.  They don't need anything regarding his medical therapy except whether or not we continue inbrija

## 2020-01-29 NOTE — Telephone Encounter (Signed)
Received a voicemail for [facility] stating the patient was on Inbrija when he was hospitalized because of an infection and had his DBS removed. They are requesting a call back to make sure Dr Tat is aware and what her thoughts are about Inbrija, the infection, the hospitalization and removal of the DBS.    Reference ID: [id number]  Call back number [phone]

## 2020-01-29 NOTE — Patient Instructions (Addendum)
Take carbidopa/levodopa 25/100 2 tablet at 7 AM/ 2 tablets at 10 AM/ 1 at 1 PM/4 PM/ and 7 PM for the next 2 days.  If still not doing well, take carbidopa/levodopa 25/100, 2 at 7am/2 at 10am, 2 at 1 pm, 1 at 4pm, 1 at 7pm  I will write an order for an order for a hospital bed  I need you to think about subacute rehab at facilities like [facility], [facility], [facility], etc.

## 2020-01-30 ENCOUNTER — Telehealth: Payer: Self-pay | Admitting: Neurology

## 2020-01-30 NOTE — Telephone Encounter (Signed)
Patient's wife called and said, "We're trying to get respite care through the [place] for home care services. Dr. Carles Collet has ordered a hospital bed for my husband and they are needing medical records to go with the order."  Fax [phone] Dr. Hoy Finlay, [place]

## 2020-01-31 MED ORDER — AMBULATORY NON FORMULARY MEDICATION: 0 refills | Status: AC

## 2020-01-31 NOTE — Telephone Encounter (Signed)
I completed this yesterday AM

## 2020-01-31 NOTE — Telephone Encounter (Signed)
Spoke with patient's wife who states she spoke with a Education officer, museum and thinks the patient will have trouble getting out of the hospital bed. She states she is working on trying to get the patient a lift chair.   Wife states she is trying to get some assistance from the [place].   Asked if anything is needed from Dr Tat.  Wife states she wants to know if Dr Tat could put something in her note that states the patient needs a lift chair. She is requesting that we send records to the [place] so they can assist the patient with getting the lift chair.   She states at the last office visit they (her and Dr Tat) only spoke about the hospital bed. She states again that the patient does not need a hospital bed due to him having multiple back surgeries in the past and the bed may not be comfortable. She states if the patient gets a lift chair it will be easier to help get him up since he is incontinent.    Advised patients wife that I would speak with Dr Tat and get back to her. She voiced understanding.

## 2020-01-31 NOTE — Telephone Encounter (Signed)
Spoke with wife who states she would like to have the referral for he hospital bed. She changed her mind and does not want the lift chair rx.   Explained to her that a release would need to be signed by the patient in order for our office to be able to send records to another provider. Wife states that its impossible for there to get him back to our office or to the [place] to sign a form.   Informed wife that the referral to [facility] for the hospital bed has been placed and they will be in touch with her soon. Gave patients wife their phone number and she stated she will give them a call.   Wife voiced understanding and states she will contact the office if she has any questions or concerns.

## 2020-01-31 NOTE — Telephone Encounter (Signed)
Spoke with patients wife and she states she we need to send records to the [place] so the patient can get respite care, the lift chair and the hospital bed.

## 2020-01-31 NOTE — Telephone Encounter (Signed)
Lift chair RX written and printed

## 2020-01-31 NOTE — Telephone Encounter (Signed)
Insurance should pay for both (maybe) but I disagree with the Education officer, museum.  The bed has a trapeze on it AND he currently has bowel incontinence because of the abx, which is going to be an issue with a lift chair I would think?

## 2020-01-31 NOTE — Telephone Encounter (Signed)
Patient's wife called in and stated she trusts Dr. Carles Collet and agrees with what she thinks.

## 2020-02-01 ENCOUNTER — Telehealth: Payer: Self-pay | Admitting: Neurology

## 2020-02-01 ENCOUNTER — Other Ambulatory Visit: Payer: Self-pay | Admitting: Neurology

## 2020-02-01 MED ORDER — CARBIDOPA-LEVODOPA 25-100 MG PO TABS
ORAL_TABLET | ORAL | 0 refills | Status: DC
Start: 2020-02-01 — End: 2020-03-03

## 2020-02-01 NOTE — Telephone Encounter (Signed)
Previous rx was not enough for a 90 day supply rx resent.

## 2020-02-01 NOTE — Telephone Encounter (Signed)
Amber from [employer] called to ask if Dr Tat will sign order for skilled nursing for patient? They have the orders for the IV but they are needing the order for the nurses to go out to the house to be signed. Please call.

## 2020-02-01 NOTE — Telephone Encounter (Signed)
That needs to come from Dr. Vertell Limber.  He ordered the IV abx and skilled nursing

## 2020-02-04 ENCOUNTER — Telehealth: Payer: Self-pay

## 2020-02-04 NOTE — Telephone Encounter (Signed)
[facility] notified 

## 2020-02-04 NOTE — Telephone Encounter (Signed)
Received a voicemail from [place] at [facility] who is calling to get verbal orders for speech therapy.   Twice a week for 2 weeks  None for once week  Once a week for 3 weeks    To treat dysphagia.   Is this ok?

## 2020-02-04 NOTE — Telephone Encounter (Signed)
Spoke with Patrick Ross who is calling to get verbal orders for occupational therapy.   2 times a week for 2 weeks  once time a week for 4 weeks.   Ok per Dr Carles Collet.

## 2020-02-04 NOTE — Telephone Encounter (Signed)
[facility].  As previous though, the skilled nursing and Antibiotics orders need to come from Dr. Donald Pore office

## 2020-02-04 NOTE — Telephone Encounter (Signed)
I advised [facility].

## 2020-02-06 ENCOUNTER — Inpatient Hospital Stay: Payer: Medicare Other | Admitting: Internal Medicine

## 2020-02-14 ENCOUNTER — Other Ambulatory Visit: Payer: Self-pay

## 2020-02-14 ENCOUNTER — Telehealth: Payer: Self-pay

## 2020-02-14 ENCOUNTER — Ambulatory Visit (INDEPENDENT_AMBULATORY_CARE_PROVIDER_SITE_OTHER): Payer: Medicare Other | Admitting: Internal Medicine

## 2020-02-14 ENCOUNTER — Encounter: Payer: Self-pay | Admitting: Internal Medicine

## 2020-02-14 VITALS — BP 121/76 | HR 86 | Temp 97.4°F

## 2020-02-14 DIAGNOSIS — T847XXD Infection and inflammatory reaction due to other internal orthopedic prosthetic devices, implants and grafts, subsequent encounter: Secondary | ICD-10-CM

## 2020-02-14 DIAGNOSIS — M869 Osteomyelitis, unspecified: Secondary | ICD-10-CM | POA: Diagnosis present

## 2020-02-14 NOTE — Telephone Encounter (Signed)
Verbal orders given to [facility] with [facility] that patient's last dose for IV antibiotics will be on 03/05/20 and picc can be pulled after last dose per Dr. Gale Journey. [name] verbalized understanding. [name]

## 2020-02-14 NOTE — Patient Instructions (Signed)
From an infectious disease standpoint, your infection is currently under good control  Your inflammation is decreasing  Your labs show no adverse effect of antibiotics on your kidney, liver, or bone marrow  Continue cefepime until we finish the planned initial 6 weeks You'll continue to have weekly labs   We'll also get duplex ultrasound right leg to make sure there is no blood clot. But I suspect lack of muscle movement on that side is causing the swelling rather    Please see [place] again in 3-4 weeks at the time you finish your antibiotics; it is ok to remove the picc before you see me

## 2020-02-14 NOTE — Progress Notes (Signed)
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   MRN: [id number]  Chief Complaint  Patient presents with   Hospitalization Follow-up    HPI:  Patrick Ross is a 74 y.o. male parkinson's disease s/p bilateral brain stimulator placement but complicated by a few displacement of left device requiring adjustment, ultimately admitted 01/22/2020 for left device insertion site infection after a few weeks of focal cellulitic change not responding to oral abx and topical gent  Presurgical swab cx grew pseudomonas  He underwent 11/23 I&d and left dbs extraciton  Never had s/s of meningoencephalitis; no csf sampled  12/16 id visit Doing well; sinemet dosing increased after November admission after dbs removed on left side. Had trouble with initiating movement right side since dbs removed and noted rle swelling No f/c/n/v/diarrhea/rash No confusion No headache On cefepime  Labs reviewed no aki/lft abnormality; crp improving   Allergies  Allergen Reactions   Other Other (See Comments)    Surgical spray prior to adhesive causes blisters   Sulfamethoxazole Other (See Comments)    Unsure--childhood allergy      Outpatient Medications Prior to Visit  Medication Sig Dispense Refill   amantadine (SYMMETREL) 100 MG capsule Take 1 capsule (100 mg total) by mouth 2 (two) times daily. (Patient not taking: Reported on 02/14/2020) 180 capsule 1   AMBULATORY NON FORMULARY MEDICATION Lift chair Dx:  G20 (Patient not taking: Reported on 02/14/2020) 1 Device 0   carbidopa-levodopa (SINEMET IR) 25-100 MG tablet 2 at  7am/2 at 10am, 2 at 1 pm, 1 at 4pm, 1 at 7pm (Patient not taking: Reported on 02/14/2020) 720 tablet 0   ceFEPime (MAXIPIME) IVPB Inject 2 g into the vein every 8 (eight) hours. Indication:  Osteomyelitis/CNS infection First Dose: No Last Day of Therapy:  03/05/2020 Labs - Once weekly:  CBC/D and BMP, Labs - Every other week:  ESR and CRP Method of administration: IV Push Method of administration may be changed at the discretion of home infusion pharmacist based upon assessment of the patient and/or caregiver's ability to self-administer the medication ordered. (Patient not taking: Reported on 02/14/2020) 126 Units 0   clonazePAM (KLONOPIN) 0.5 MG tablet 1.5 tabs at bed (Patient not taking: Reported on 02/14/2020) 135 tablet 1   escitalopram (LEXAPRO) 10 MG tablet Take 10 mg by mouth at bedtime. (Patient not taking: Reported on 02/14/2020)     famotidine (PEPCID) 20 MG tablet Take 20 mg by mouth 2 (two) times daily. (Patient not taking: Reported on 02/14/2020)     Levodopa (INBRIJA) 42 MG CAPS Place 2 capsules into inhaler and inhale 5 (five) times daily as needed (up to 5 times a day as needed). (Patient not taking: Reported on 02/14/2020) 60 capsule 0   levothyroxine (SYNTHROID) 125 MCG tablet Take 125 mcg by mouth daily.  (Patient not taking: Reported on 02/14/2020)     lisinopril (PRINIVIL,ZESTRIL) 20 MG tablet Take 20 mg by mouth daily.  (Patient not taking: Reported on 02/14/2020)     mirabegron ER (MYRBETRIQ) 25 MG TB24 tablet Take 25 mg by  mouth at bedtime. (Patient not taking: Reported on 02/14/2020)     OVER THE COUNTER MEDICATION Take 2 tablets by mouth 2 (two) times daily. Alpha CRS otc supplement (Patient not taking: Reported on 02/14/2020)     OVER THE COUNTER MEDICATION Take 2 capsules by mouth 2 (two) times daily. Microplex VMz otc supplement (Patient not taking: Reported on 02/14/2020)     OVER THE COUNTER MEDICATION Take 2 capsules by mouth 2 (two) times daily. XEOmega 3  oil complex (Patient not taking: Reported on 02/14/2020)     OVER THE COUNTER MEDICATION Take 1 tablet by mouth daily. onguard+ otc supplement (Patient not taking: Reported on 02/14/2020)     OVER THE COUNTER MEDICATION Take 3 capsules by mouth at bedtime. terrazyme supplement (Patient not taking: Reported on 02/14/2020)     OVER THE COUNTER MEDICATION Take 1 tablet by mouth 2 (two) times daily. Bone nutrient otc supplement (Patient not taking: Reported on 02/14/2020)     OVER THE COUNTER MEDICATION Take 1 capsule by mouth at bedtime. PB Assist pro/prebiotic (Patient not taking: Reported on 02/14/2020)     Testosterone 10 MG/ACT (2%) GEL Place 6 Pump onto the skin daily. 3 pump on each inner thigh (Patient not taking: Reported on 02/14/2020)     No facility-administered medications prior to visit.     Social History   Socioeconomic History   Marital status: Married    Spouse name: Not on file   Number of children: 3   Years of education: Not on file   Highest education level: Master's degree (e.g., MA, MS, MEng, MEd, MSW, MBA)  Occupational History   Not on file  Tobacco Use   Smoking status: Never Smoker   Smokeless tobacco: Never Used  Vaping Use   Vaping Use: Never used  Substance and Sexual Activity   Alcohol use: Not Currently   Drug use: Never   Sexual activity: Not on file  Other Topics Concern   Not on file  Social History Narrative   Right handed   2 story home    Lives with spouse    Social Determinants of Health   Financial Resource Strain: Not on file  Food Insecurity: Not on file  Transportation Needs: Not on file  Physical Activity: Not on file  Stress: Not on file  Social Connections: Not on file  Intimate Partner Violence: Not on file      Review of Systems   negative 11 point ros unless mentioned above  Objective:    BP 121/76    Pulse 86    Temp (!) 97.4 F (36.3 C) (Oral)  Nursing note and vital signs reviewed.  Physical  Exam Patient here with wife He is in wheel chair Heent: left frontotemporal area and post-auricular area surgical incision all healed no fluctuance, swelling, tenderness; normocephalic otherwise; per; conj clear Neck supple cv rrr no mrg Skin left chest (?battery site of left dbs) incision healed no erythema/tenderness/fluctuance; rue picc site clean no erythema/flutuance/tenderness Ext RLE edema Neuro: stiff extremities with mild resting tremor of right UE  Psych alert/oriented    Labs: Reviewed on [facility] website  Micro:  Serology:  Imaging:  Assessment & Plan:   Problem List Items Addressed This Visit   None    Abx: 11/23-c cefepime  Prior to admission doxycycline and topical gentamycin     Assessment: 74 yo male OSA, anxiety, GERD, hypothyroidism, lumbar radiculopathy s/p lumbar fusion 2014, parkinson's disease, s/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 He presented to ID clinic for f/u  Of note he had been receiving doxycycline and getting topical gentamycin from his dermatology office for the soft tissue infection process  Given the exposed hardware, which connects through bone into the cns area, will treat as Osteomyelitis which will cover for any potential nosocomial meningitis  No recent cns imaging but doesn't appear to exhibit intracranial s/s of meningitis/encephalitis  Pseudomonas growing from wound cx Patient clinically doing well on cefepime, pending discharge home today  I don't have his prior wound swab culture yet; had asked dr Vertell Limber yesterday; will request again once he is in clinic with [place]  12/16 id outpatient  f/u Clinically doing well; right sided decreased motor function likely due to parkinson/removal of dbs. Right le swelling likely same; but will r/o dvt No abx adverse effect crp improving   Plan: 1. continue Cefepime 2 gram iv q8hour 6 weeks 11/24-1/06/2020 2. F/u with me 4 weeks 3. Continue weekly cbc, cmp, crp   I am having Patrick C. Kovacik "Charlie" maintain his lisinopril, levothyroxine, Testosterone, OVER THE COUNTER MEDICATION, OVER THE COUNTER MEDICATION, OVER THE COUNTER MEDICATION, OVER THE COUNTER MEDICATION, OVER THE COUNTER MEDICATION, escitalopram, clonazePAM, Inbrija, amantadine, famotidine, mirabegron ER, OVER THE COUNTER MEDICATION, OVER THE COUNTER MEDICATION, ceFEPime, AMBULATORY NON FORMULARY MEDICATION, and carbidopa-levodopa.   No orders of the defined types were placed in this encounter.    Follow-up: Return in about 4 weeks (around 03/13/2020).      Jabier Mutton, [facility] [place] -- -- pager   [phone] cell 02/14/2020, 10:56 AM

## 2020-02-15 ENCOUNTER — Telehealth: Payer: Self-pay | Admitting: Infectious Disease

## 2020-02-15 ENCOUNTER — Other Ambulatory Visit: Payer: Self-pay | Admitting: Infectious Disease

## 2020-02-15 ENCOUNTER — Other Ambulatory Visit: Payer: Self-pay

## 2020-02-15 ENCOUNTER — Emergency Department (HOSPITAL_COMMUNITY)
Admission: EM | Admit: 2020-02-15 | Discharge: 2020-02-15 | Disposition: A | Discharge status: Home / Self Care | Payer: Medicare Other | Attending: Emergency Medicine | Admitting: Emergency Medicine

## 2020-02-15 ENCOUNTER — Ambulatory Visit (INDEPENDENT_AMBULATORY_CARE_PROVIDER_SITE_OTHER)
Admission: RE | Admit: 2020-02-15 | Discharge: 2020-02-15 | Disposition: A | Discharge status: Home / Self Care | Payer: Medicare Other | Source: Ambulatory Visit | Attending: Infectious Disease | Admitting: Infectious Disease

## 2020-02-15 ENCOUNTER — Encounter (HOSPITAL_COMMUNITY): Payer: Self-pay | Admitting: Emergency Medicine

## 2020-02-15 DIAGNOSIS — R600 Localized edema: Secondary | ICD-10-CM

## 2020-02-15 DIAGNOSIS — M549 Dorsalgia, unspecified: Secondary | ICD-10-CM | POA: Insufficient documentation

## 2020-02-15 DIAGNOSIS — I82401 Acute embolism and thrombosis of unspecified deep veins of right lower extremity: Secondary | ICD-10-CM | POA: Diagnosis not present

## 2020-02-15 DIAGNOSIS — M79604 Pain in right leg: Secondary | ICD-10-CM | POA: Diagnosis present

## 2020-02-15 DIAGNOSIS — Z9682 Presence of neurostimulator: Secondary | ICD-10-CM | POA: Diagnosis not present

## 2020-02-15 DIAGNOSIS — Z79899 Other long term (current) drug therapy: Secondary | ICD-10-CM | POA: Insufficient documentation

## 2020-02-15 DIAGNOSIS — E039 Hypothyroidism, unspecified: Secondary | ICD-10-CM | POA: Insufficient documentation

## 2020-02-15 DIAGNOSIS — I10 Essential (primary) hypertension: Secondary | ICD-10-CM | POA: Diagnosis not present

## 2020-02-15 LAB — BASIC METABOLIC PANEL
Anion gap: 14 (ref 5–15)
BUN: 17 mg/dL (ref 8–23)
CO2: 23 mmol/L (ref 22–32)
Calcium: 9.4 mg/dL (ref 8.9–10.3)
Chloride: 103 mmol/L (ref 98–111)
Creatinine, Ser: 0.75 mg/dL (ref 0.61–1.24)
GFR, Estimated: >60 mL/min (ref >60)
Glucose, Bld: 120 mg/dL — ABNORMAL HIGH (ref 70–99)
Potassium: 3.7 mmol/L (ref 3.5–5.1)
Sodium: 140 mmol/L (ref 135–145)

## 2020-02-15 LAB — CBC
HCT: 43.3 % (ref 39.0–52.0)
Hemoglobin: 14.7 g/dL (ref 13.0–17.0)
MCH: 32 pg (ref 26.0–34.0)
MCHC: 33.9 g/dL (ref 30.0–36.0)
MCV: 94.1 fL (ref 80.0–100.0)
Platelets: 160 10*3/uL (ref 150–400)
RBC: 4.6 MIL/uL (ref 4.22–5.81)
RDW: 12.4 % (ref 11.5–15.5)
WBC: 5.6 10*3/uL (ref 4.0–10.5)
nRBC: 0 % (ref 0.0–0.2)

## 2020-02-15 LAB — APTT: aPTT: 30 seconds (ref 24–36)

## 2020-02-15 LAB — PROTIME-INR
INR: 1.1 (ref 0.8–1.2)
Prothrombin Time: 13.6 seconds (ref 11.4–15.2)

## 2020-02-15 MED ORDER — APIXABAN 5 MG PO TABS: 5.0000 mg | ORAL_TABLET | Freq: Two times a day (BID) | ORAL | Status: DC

## 2020-02-15 MED ORDER — APIXABAN 5 MG PO TABS: 5.0000 mg | ORAL_TABLET | Freq: Two times a day (BID) | ORAL | 0 refills | Status: DC

## 2020-02-15 MED ORDER — APIXABAN (ELIQUIS) EDUCATION KIT FOR DVT/PE PATIENTS
PACK | Freq: Once | Status: AC
  Filled 2020-02-15 (×2): qty 1

## 2020-02-15 MED ORDER — APIXABAN 5 MG PO TABS: 10.0000 mg | ORAL_TABLET | Freq: Two times a day (BID) | ORAL | Status: DC

## 2020-02-15 MED ORDER — APIXABAN 5 MG PO TABS
10.0000 mg | ORAL_TABLET | Freq: Two times a day (BID) | ORAL | Status: DC
  Administered 2020-02-15: 20:00:00 10 mg via ORAL
  Filled 2020-02-15: qty 2

## 2020-02-15 NOTE — Progress Notes (Signed)
Information on my medicine - ELIQUIS (apixaban)  This medication education was reviewed with me or my healthcare representative as part of my discharge preparation.  The pharmacist that spoke with me during my hospital stay was:  Duanne Limerick, [facility]  Why was Eliquis prescribed for you? Eliquis was prescribed for you to reduce the risk of forming blood clots that can cause a stroke if you have a medical condition called atrial fibrillation (a type of irregular heartbeat) OR to reduce the risk of a blood clots forming after orthopedic surgery.  What do You need to know about Eliquis ? Take your Eliquis TWICE DAILY - one tablet in the morning and one tablet in the evening with or without food.  It would be best to take the doses about the same time each day.  If you have difficulty swallowing the tablet whole please discuss with your pharmacist how to take the medication safely.  Take Eliquis exactly as prescribed by your doctor and DO NOT stop taking Eliquis without talking to the doctor who prescribed the medication.  Stopping may increase your risk of developing a new clot or stroke.  Refill your prescription before you run out.  After discharge, you should have regular check-up appointments with your healthcare provider that is prescribing your Eliquis.  In the future your dose may need to be changed if your kidney function or weight changes by a significant amount or as you get older.  What do you do if you miss a dose? If you miss a dose, take it as soon as you remember on the same day and resume taking twice daily.  Do not take more than one dose of ELIQUIS at the same time.  Important Safety Information A possible side effect of Eliquis is bleeding. You should call your healthcare provider right away if you experience any of the following: ? Bleeding from an injury or your nose that does not stop. ? Unusual colored urine (red or dark brown) or unusual colored stools (red or  black). ? Unusual bruising for unknown reasons. ? A serious fall or if you hit your head (even if there is no bleeding).  Some medicines may interact with Eliquis and might increase your risk of bleeding or clotting while on Eliquis. To help avoid this, consult your healthcare provider or pharmacist prior to using any new prescription or non-prescription medications, including herbals, vitamins, non-steroidal anti-inflammatory drugs (NSAIDs) and supplements.  This website has more information on Eliquis (apixaban): [email or web address].no.

## 2020-02-15 NOTE — Progress Notes (Signed)
[facility] had a venous duplex at our office.  He is positive.  The referring MD had him go to the ED.  Preliminary report can be found in the CV Images tab in Chart Review.  Ralene Cork, RVT

## 2020-02-15 NOTE — ED Triage Notes (Signed)
Pt presents to ED POV. Pt sent here by vascular/vein center. 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 Unable to doppler pulse in triage but pt has good cap refill.

## 2020-02-15 NOTE — Telephone Encounter (Signed)
Patients wife called concerned that duplex not yet done. I have ordered one right now  Sounds like he had LE edema on R x week not getting better or worse  Will touch base with Dr. Gale Journey if he feels urgent and pt needs to go to ER  (wife would prefer not to do that)

## 2020-02-15 NOTE — ED Provider Notes (Signed)
[facility] DEPARTMENT Provider Note   CSN: [id number] Arrival date & time: 02/15/20  1657     History Chief Complaint  Patient presents with  . Leg Pain    Patrick Ross is a 74 y.o. male.  He has a history of Parkinson's and had a brain stimulator that needed to be removed due to infection.  He has been slow to recover from that and is still getting antibiotics through PICC line.  For about a week he has had progressive swelling and discomfort in his right leg.  His ID doctor sent him for an ultrasound today which ultimately showed an acute DVT up to the iliac.  He denies any chest pain or shortness of breath.  No prior history of DVT.  Not on any current anticoagulation  The history is provided by the patient and the spouse.  Leg Pain Location:  Leg Leg location:  R leg Pain details:    Quality:  Aching   Severity:  Mild   Onset quality:  Gradual   Duration:  1 week   Timing:  Constant   Progression:  Worsening Chronicity:  New Relieved by:  None tried Worsened by:  Nothing Ineffective treatments:  None tried Associated symptoms: back pain, decreased ROM and swelling   Associated symptoms: no fever        Past Medical History:  Diagnosis Date  . Depression   . GERD (gastroesophageal reflux disease)   . History of kidney stones    passed  . Hypertension   . Hypothyroidism   . Lumbar stenosis   . OSA (obstructive sleep apnea)    wears cpap  . Parkinson's disease ([place])   . PONV (postoperative nausea and vomiting)   . REM behavioral disorder   . Urinary incontinence     Patient Active Problem List   Diagnosis Date Noted  . Open scalp wound 01/22/2020  . 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[place])   . 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 Parkinson's disease ([place]) 01/16/2018  . 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 BACK SURGERY      x 2 - not fusion  . COLONOSCOPY    . DEEP BRAIN STIMULATOR PLACEMENT     left 12/27/2012, right 12/10/2011, left revision 02/02/2013, replacement bilateral 11/21/2015  . MINOR PLACEMENT OF FIDUCIAL N/A 01/09/2019   Procedure: Fiducial placement;  Surgeon: Erline Levine, MD;  Location: [facility];  Service: Neurosurgery;  Laterality: N/A;  Fiducial placement  . PARTIAL NEPHRECTOMY Right 2005 ish  . PULSE GENERATOR IMPLANT Right 01/23/2019   Procedure: Right chest implantable pulse generator change;  Surgeon: Erline Levine, MD;  Location: [place];  Service: Neurosurgery;  Laterality: Right;  . SPINAL FUSION     lumbar Fusion x 2  . SUBTHALAMIC STIMULATOR BATTERY REPLACEMENT Left 02/02/2018   Procedure: Change implantable pulse generator battery, Left chest;  Surgeon: Erline Levine, MD;  Location: [place];  Service: Neurosurgery;  Laterality: Left;  . SUBTHALAMIC STIMULATOR BATTERY REPLACEMENT N/A 01/22/2020   Procedure: REMOVAL OF LEFT BRAIN DEEP BRAIN STIMULATOR ELECTRODE, EXTENSION, AND PULSE GENERATOR;  Surgeon: Erline Levine, MD;  Location: [place];  Service: Neurosurgery;  Laterality: N/A;  . SUBTHALAMIC STIMULATOR INSERTION Left 01/16/2019   Procedure: Left deep brain stimulator electrode revision.;  Surgeon: Erline Levine, MD;  Location: [facility];  Service: Neurosurgery;  Laterality: Left;  . SUBTHALAMIC STIMULATOR INSERTION Left  01/23/2019   Procedure: Repositioning of Left Deep Brain Stimulator Electrode;  Surgeon: Erline Levine, MD;  Location: [place];  Service: Neurosurgery;  Laterality: Left;  . TONSILLECTOMY         Family History  Problem Relation Age of Onset  . AAA (abdominal aortic aneurysm) Mother        58 yo  . Heart disease Father   . Diabetes Father   . Heart disease Brother   . Diabetes Brother   . Healthy Child     Social History   Tobacco Use  . Smoking status: Never Smoker  . Smokeless tobacco: Never Used  Vaping Use  . Vaping Use: Never used  Substance Use Topics  .  Alcohol use: Not Currently  . Drug use: Never    Home Medications Prior to Admission medications   Medication Sig Start Date End Date Taking? Authorizing Provider  amantadine (SYMMETREL) 100 MG capsule Take 1 capsule (100 mg total) by mouth 2 (two) times daily. Patient not taking: Reported on 02/14/2020 01/02/20   Tat, Eustace Quail, DO  AMBULATORY NON FORMULARY MEDICATION Lift chair Dx:  G20 Patient not taking: Reported on 02/14/2020 01/31/20   Tat, Eustace Quail, DO  carbidopa-levodopa (SINEMET IR) 25-100 MG tablet 2 at 7am/2 at 10am, 2 at 1 pm, 1 at 4pm, 1 at 7pm Patient not taking: Reported on 02/14/2020 02/01/20   Tat, Eustace Quail, DO  ceFEPime (MAXIPIME) IVPB Inject 2 g into the vein every 8 (eight) hours. Indication:  Osteomyelitis/CNS infection First Dose: No Last Day of Therapy:  03/05/2020 Labs - Once weekly:  CBC/D and BMP, Labs - Every other week:  ESR and CRP Method of administration: IV Push Method of administration may be changed at the discretion of home infusion pharmacist based upon assessment of the patient and/or caregiver's ability to self-administer the medication ordered. Patient not taking: Reported on 02/14/2020 01/23/20 03/05/20  Erline Levine, MD  clonazePAM Bobbye [place]) 0.5 MG tablet 1.5 tabs at bed Patient not taking: Reported on 02/14/2020 11/29/19   Tat, Eustace Quail, DO  escitalopram (LEXAPRO) 10 MG tablet Take 10 mg by mouth at bedtime. Patient not taking: Reported on 02/14/2020    [provider]  famotidine (PEPCID) 20 MG tablet Take 20 mg by mouth 2 (two) times daily. Patient not taking: Reported on 02/14/2020    [provider]  Levodopa (INBRIJA) 42 MG CAPS Place 2 capsules into inhaler and inhale 5 (five) times daily as needed (up to 5 times a day as needed). Patient not taking: Reported on 02/14/2020 12/20/19   Tat, Eustace Quail, DO  levothyroxine (SYNTHROID) 125 MCG tablet Take 125 mcg by mouth daily.  Patient not taking: Reported on 02/14/2020 02/17/18    [provider]  lisinopril (PRINIVIL,ZESTRIL) 20 MG tablet Take 20 mg by mouth daily.  Patient not taking: Reported on 02/14/2020 05/04/17   [provider]  mirabegron ER (MYRBETRIQ) 25 MG TB24 tablet Take 25 mg by mouth at bedtime. Patient not taking: Reported on 02/14/2020    [provider]  OVER THE COUNTER MEDICATION Take 2 tablets by mouth 2 (two) times daily. Alpha CRS otc supplement Patient not taking: Reported on 02/14/2020    [provider]  OVER THE COUNTER MEDICATION Take 2 capsules by mouth 2 (two) times daily. Microplex VMz otc supplement Patient not taking: Reported on 02/14/2020    [provider]  OVER THE COUNTER MEDICATION Take 2 capsules by mouth 2 (two) times daily. XEOmega 3  oil complex Patient not taking: Reported on 02/14/2020    [provider]  OVER THE COUNTER MEDICATION Take 1 tablet by mouth daily. onguard+ otc supplement Patient not taking: Reported on 02/14/2020    [provider]  OVER THE COUNTER MEDICATION Take 3 capsules by mouth at bedtime. terrazyme supplement Patient not taking: Reported on 02/14/2020    [provider]  OVER THE COUNTER MEDICATION Take 1 tablet by mouth 2 (two) times daily. Bone nutrient otc supplement Patient not taking: Reported on 02/14/2020    [provider]  OVER THE COUNTER MEDICATION Take 1 capsule by mouth at bedtime. PB Assist pro/prebiotic Patient not taking: Reported on 02/14/2020    [provider]  Testosterone 10 MG/ACT (2%) GEL Place 6 Pump onto the skin daily. 3 pump on each inner thigh Patient not taking: Reported on 02/14/2020    [provider]    Allergies    Other and Sulfamethoxazole  Review of Systems   Review of Systems  Constitutional: Negative for fever.  HENT: Negative for sore throat.   Eyes: Negative for visual disturbance.  Respiratory: Negative for shortness of breath.   Cardiovascular: Positive  for leg swelling. Negative for chest pain.  Gastrointestinal: Negative for abdominal pain.  Genitourinary: Negative for dysuria.  Musculoskeletal: Positive for back pain.  Skin: Negative for rash.  Neurological: Positive for tremors.    Physical Exam Updated Vital Signs BP (!) 144/94   Pulse (!) 104   Temp 98.4 F (36.9 C) (Oral)   Resp 18   Ht 6' 3"  (1.905 m)   Wt 104.3 kg   SpO2 100%   BMI 28.75 kg/m   Physical Exam Vitals and nursing note reviewed.  Constitutional:      Appearance: Normal appearance. He is well-developed and well-nourished.  HENT:     Head: Normocephalic and atraumatic.  Eyes:     Conjunctiva/sclera: Conjunctivae normal.  Cardiovascular:     Rate and Rhythm: Normal rate and regular rhythm.     Heart sounds: No murmur heard.   Pulmonary:     Effort: Pulmonary effort is normal. No respiratory distress.     Breath sounds: Normal breath sounds.  Abdominal:     Palpations: Abdomen is soft.     Tenderness: There is no abdominal tenderness.  Musculoskeletal:        General: Tenderness present.     Cervical back: Neck supple.     Right lower leg: Edema present.     Left lower leg: No edema.     Comments: Right lower extremity is diffusely swollen.  No obvious cords palpated.  Distal pulses difficult to appreciate but leg pink and well perfused.  Doppler signals were obtained.  Skin:    General: Skin is warm and dry.     Capillary Refill: Capillary refill takes less than 2 seconds.  Neurological:     Mental Status: He is alert. Mental status is at baseline.  Psychiatric:        Mood and Affect: Mood and affect normal.     ED Results / Procedures / Treatments   Labs (all labs ordered are listed, but only abnormal results are displayed) Labs Reviewed  BASIC METABOLIC PANEL - Abnormal; Notable for the following components:      Result Value   Glucose, Bld 120 (*)    All other components within normal limits  CBC  APTT  PROTIME-INR     EKG None  Radiology LE VENOUS  Result  Date: 02/15/2020  Lower Venous DVT Study Indications: DVT.  Performing Technologist: Ralene Cork RVT  Examination Guidelines: A complete evaluation includes B-mode imaging, spectral Doppler, color Doppler, and power Doppler as needed of all accessible portions of each vessel. Bilateral testing is considered an integral part of a complete examination. Limited examinations for reoccurring indications may be performed as noted. The reflux portion of the exam is performed with the patient in reverse Trendelenburg.  +---------+---------------+---------+-----------+----------+--------------+ RIGHT    CompressibilityPhasicitySpontaneityPropertiesThrombus Aging +---------+---------------+---------+-----------+----------+--------------+ CFV      None           No       No                                  +---------+---------------+---------+-----------+----------+--------------+ SFJ      None                    No                                  +---------+---------------+---------+-----------+----------+--------------+ FV Prox  None           No       No                                  +---------+---------------+---------+-----------+----------+--------------+ FV Mid   None           No       No                                  +---------+---------------+---------+-----------+----------+--------------+ FV DistalNone           No       No                                  +---------+---------------+---------+-----------+----------+--------------+ POP      None           No       No                                  +---------+---------------+---------+-----------+----------+--------------+ PTV      None                    No                                  +---------+---------------+---------+-----------+----------+--------------+ Gastroc  None           No       No                                   +---------+---------------+---------+-----------+----------+--------------+ GSV      None           No       No                                  +---------+---------------+---------+-----------+----------+--------------+  Right EIV               No       No                                  +---------+---------------+---------+-----------+----------+--------------+ Right CIV               No       No                                  +---------+---------------+---------+-----------+----------+--------------+ IVC and proximal CIV are patent. Findings reported to Dr. Drucilla Schmidt at 4:220pm. Instructed to have patient go to the Emergency Department.  Summary: RIGHT: - Acute DVT from the mid common iliac vein throough the distal posterior tibial vein and including the gastrocnemious vein. The great saphenous vein is thrombosed thoughout its length.   *See table(s) above for measurements and observations.    Preliminary     Procedures Procedures (including critical care time)  Medications Ordered in ED Medications  apixaban (ELIQUIS) tablet 10 mg (10 mg Oral Given 02/15/20 1955)    Followed by  apixaban (ELIQUIS) tablet 5 mg (has no administration in time range)  apixaban Arne Cleveland) Education Kit for DVT/PE patients ( Does not apply Given 02/15/20 2004)    ED Course  I have reviewed the triage vital signs and the nursing notes.  Pertinent labs & imaging results that were available during my care of the patient were reviewed by me and considered in my medical decision making (see chart for details).  Clinical Course as of 02/15/20 2301  Fri Feb 15, 2020  0240 Dr. Vertell Limber 11/23 - patient underwent removal of left deep brain stimulator electrode, extension, and pulse generator [MB]  1801 Duplex today - Summary:  RIGHT:  - Acute DVT from the mid common iliac vein throough the distal posterior  tibial vein and including the gastrocnemious vein. The great saphenous  vein is thrombosed  thoughout its length.  [MB]  9735 Discussed with Dr. Ellene Route neurosurgery who confirm with Dr. Vertell Limber, it is okay to proceed with anticoagulation. [MB]  1841 Discussed with Dr. Carlis Abbott.  He says that there is no evidence of phlegmasia that the patient could be started on oral anticoagulation and he will follow up in the office in a week or so make sure he is improving. [MB]  1852 Unable to appreciate pulses due to edema but he has both PT and DP signal [MB]    Clinical Course User Index [MB] Hayden Rasmussen, MD   MDM Rules/Calculators/A&P                         This patient complains of right leg swelling and pain.; this involves an extensive number of treatment Options and is a complaint that carries with it a high risk of complications and Morbidity. The differential includes DVT, arterial occlusion, cellulitis  I ordered, reviewed and interpreted labs, which included CBC with normal white count normal hemoglobin, chemistries normal other than elevated glucose, normal coags I ordered medication oral Eliquis I reviewed the interpretation of the patient's outpatient DVT study as positive DVT Additional history obtained from patient's wife Previous records obtained and reviewed in epic including visits to infectious disease and ordering him an outpatient DVT study I consulted neurosurgery Dr. Ellene Route and vascular  surgery Dr. Carlis Abbott and discussed lab and imaging findings  Critical Interventions: None  After the interventions stated above, I reevaluated the patient and found patient to be in no particular pain.  No clinical evidence of PE as pulse ox is 100% on room air.  Recommended close follow-up outpatient with vascular and along with his ID doctors.  Return instructions discussed.   Final Clinical Impression(s) / ED Diagnoses Final diagnoses:  Acute deep vein thrombosis (DVT) of right lower extremity, unspecified vein ([place])    Rx / DC Orders ED Discharge Orders         Ordered     apixaban (ELIQUIS) 5 MG TABS tablet  2 times daily        02/15/20 Vilma Prader, MD 02/15/20 2304

## 2020-02-15 NOTE — Discharge Instructions (Signed)
You were seen in the emergency department for evaluation of right leg swelling and an abnormal ultrasound.  You had a deep vein thrombosis of your right leg.  This will require blood thinners.  We confirmed with neurosurgery that it was okay to start on blood thinners.

## 2020-02-19 NOTE — Progress Notes (Signed)
Assessment/Plan:   1.  Parkinsons Disease             -The patient underwent right STN DBS on December 10, 2011 and a left STN DBS on December 27, 2012. Postoperatively, his left STN DBS lead was found to be displaced and had revision of the left lead on February 02, 2013 (pulled back 1 cm without MER). IPGon the RIGHTchanged on 01/23/19. IPG on the left last changed on 02/02/18 -Patient underwent lead revision on January 16, 2019 on the left, as the lead was still displaced/deep. It was pulled back 7.5 mm. Postoperative imaging indicated that it was still 5 mm toodeep, so when the battery was changed to the following week, it was pulled back another 5 mm.  Postoperative imaging indicates that the leads were in good place, but the patient hit his head on the corner of a wall and developed an abrasion over the lead, which ultimately exposed to lead.  This led to a wound infection, requiring that the entire system on the left be removed on January 22, 2020.    -Discussed with patient that DBS system on the left will likely need to be left out for at least 6 months.  When we do replace it, it will be challenging given skin integrity on the scalp.  Goal is tentatively to replace in approximately May, 2021, if he is able to be off of Eliquis by that time.  -Patient given the following instructions to increase levodopa: Increase carbidopa/levodopa 25/100, 2 at 7am, 2 at 9:30am, 2 at noon, 2 at 2:30 pm, 2 at 5pm.  If you need an extra late evening, you may take 1 extra  -Continue amantadine, 100 mg twice per day  -Continue Inbrija as needed, up to 5 times per day, separated by 2-hour intervals.   2.  RBD             -Continue clonazepam, 0.5 mg, 1.5 tablets at night.  Wife brought bottle and compared this to PDMP.  He last filled his medication on October 20 according to the bottle and October 22 according to [place].  Regardless, his bottle states that he cannot fill it until January 26,  which does not make sense, and makes him running out of medication early.  I told him to start filling the clonazepam locally.  3.  Urinary incontinence             -Following with Dr. Felipa Eth in [facility] (urology), who started him on Myrbetriq in September, 2021.  4.  Low back pain             -Follows with Dr. Letta Pate as well as [facility] neurosurgery.  He is having more difficulties with his back since his DBS was removed (not able to move nearly as well)  5.  New DVT  -Recently placed on Eliquis  -This may present some difficulties with re-implantation of DBS lead, given that this is generally a staged surgery, having to be off anticoagulants for quite some time.  We have him tentatively scheduled for surgery for May timeframe, but will just have to see how this develops.  However, according to vascular surgery, he should only need to be on Eliquis for 3 months or so, but I will defer to vascular surgery and to primary care, as to when he will be able to get off of the Eliquis.   Subjective:   Patrick Ross was seen today.  Patient followed up with infectious disease on December 16.  Doppler ultrasound was ordered given swelling of the leg.  Acute DVT up to the iliac was identified.  Patient was discharged on Eliquis.  The plan per vascular surgery notes is to continue Eliquis for at least 3 months.  Pt states that physically he is doing better than last visit - he is better with movement and with pain.  He has specific trouble around 10-10:30 am and has more freezing then and trouble between 3-4.  He is wanting to go to bed in the early evening b/c of wearing off of med.  No hallucination.  Had one episode of a "hallucination" that arose out of sleep - thought that 2 men were trying to break in.  PT is coming out to the home as well as OT/ST.  Walking with the walker some but trouble with remembering to straighten the R leg with ambulation.  Using condom cath at night.     Current prescribed movement disorder medications: Carbidopa/levodopa 25/100, 2 at 7am/2 at 10am, 2 at 1 pm, 1 at 4pm, 1 at 7pm Clonazepam 0.5 mg, 1-1/2 tablets at bedtime Amantadine, 100 mg twice per day  Inbrija (using qd-bid)   ALLERGIES:   Allergies  Allergen Reactions   Other Other (See Comments)    Surgical spray prior to adhesive causes blisters   Sulfamethoxazole Other (See Comments)    Unsure--childhood allergy    CURRENT MEDICATIONS:  Outpatient Encounter Medications as of 03/03/2020  Medication Sig   amantadine (SYMMETREL) 100 MG capsule Take 1 capsule (100 mg total) by mouth 2 (two) times daily.   AMBULATORY NON FORMULARY MEDICATION Lift chair Dx:  G20 (Patient taking differently: Lift chair Dx:  G20)   apixaban (ELIQUIS) 5 MG TABS tablet Take 1 tablet (5 mg total) by mouth 2 (two) times daily. 2 tablets 2 times a day for first week.   ceFEPime (MAXIPIME) IVPB Inject 2 g into the vein every 8 (eight) hours. Indication:  Osteomyelitis/CNS infection First Dose: No Last Day of Therapy:  03/05/2020 Labs - Once weekly:  CBC/D and BMP, Labs - Every other week:  ESR and CRP Method of administration: IV Push Method of administration may be changed at the discretion of home infusion pharmacist based upon assessment of the patient and/or caregiver's ability to self-administer the medication ordered. (Patient taking differently: Inject 2 g into the vein every 8 (eight) hours. Indication:  Osteomyelitis/CNS infection First Dose: No Last Day of Therapy:  03/05/2020 Labs - Once weekly:  CBC/D and BMP, Labs - Every other week:  ESR and CRP Method of administration: IV Push Method of administration may be changed at the discretion of home infusion pharmacist based upon assessment of the patient and/or caregiver's ability to self-administer the medication ordered.)   clonazePAM (KLONOPIN) 0.5 MG tablet 1.5 tabs at bed   escitalopram (LEXAPRO) 10 MG tablet Take 10 mg by mouth at  bedtime.   famotidine (PEPCID) 20 MG tablet Take 20 mg by mouth 2 (two) times daily.   Levodopa (INBRIJA) 42 MG CAPS Place 2 capsules into inhaler and inhale 5 (five) times daily as needed (up to 5 times a day as needed).   levothyroxine (SYNTHROID) 125 MCG tablet Take 125 mcg by mouth daily.   lisinopril (PRINIVIL,ZESTRIL) 20 MG tablet Take 20 mg by mouth daily.   mirabegron ER (MYRBETRIQ) 25 MG TB24 tablet Take 25 mg by mouth at bedtime.   OVER THE COUNTER MEDICATION Take 2 capsules  by mouth 2 (two) times daily. Microplex VMz otc supplement   OVER THE COUNTER MEDICATION Take 2 capsules by mouth 2 (two) times daily. XEOmega 3 oil complex   OVER THE COUNTER MEDICATION Take 1 tablet by mouth daily. onguard+ otc supplement   OVER THE COUNTER MEDICATION Take 3 capsules by mouth at bedtime. terrazyme supplement   OVER THE COUNTER MEDICATION Take 1 tablet by mouth 2 (two) times daily. Bone nutrient otc supplement   OVER THE COUNTER MEDICATION Take 1 capsule by mouth at bedtime. PB Assist pro/prebiotic   Testosterone 10 MG/ACT (2%) GEL Place 6 Pump onto the skin daily. 3 pump on each inner thigh   [DISCONTINUED] carbidopa-levodopa (SINEMET IR) 25-100 MG tablet 2 at 7am/2 at 10am, 2 at 1 pm, 1 at 4pm, 1 at 7pm   carbidopa-levodopa (SINEMET IR) 25-100 MG tablet 2 at 7am, 2 at 9:30am, 2 at noon, 2 at 2:30 pm, 2 at 5pm.  If you need an extra late evening, you may take 1 extra   OVER THE COUNTER MEDICATION Take 2 tablets by mouth 2 (two) times daily. Alpha CRS otc supplement (Patient not taking: Reported on 03/03/2020)   No facility-administered encounter medications on file as of 03/03/2020.    Objective:   PHYSICAL EXAMINATION:    VITALS:   Vitals:   03/03/20 1358  BP: 126/84  Pulse: 95  Resp: 20  SpO2: 95%  Weight: 231 lb (104.8 kg)  Height: 6' 2" (1.88 m)    GEN:  The patient appears stated age and is in NAD. HEENT:  Normocephalic.  Scalp incision is well-healed.  The mucous  membranes are moist. The superficial temporal arteries are without ropiness or tenderness. CV:  RRR Lungs:  CTAB Neck/HEME:  There are no carotid bruits bilaterally.  Neurological examination:  Orientation: The patient is alert and oriented x3. Cranial nerves: There is good facial symmetry with facial hypomimia. The speech is fluent and clear.  He is somewhat dysarthric. Soft palate rises symmetrically and there is no tongue deviation. Hearing is intact to conversational tone. Sensation: Sensation is intact to light touch throughout Motor: Strength is at least antigravity x4.  Movement examination: Tone: There is mild increased tone in the RUE/RLE Abnormal movements: none Coordination:  There is mild decremation on the right (this is markedly improved) Gait and Station: The patient is able to get up out of his wheelchair by himself (wife states that he does not often accomplish this by himself at home).  He is given a U step walker.  He does festinate, but actually walks with it quite well in the hallway (at his baseline even with the DBS in place).  I have reviewed and interpreted the following labs independently   Chemistry      Component Value Date/Time   NA 140 02/15/2020 1733   K 3.7 02/15/2020 1733   CL 103 02/15/2020 1733   CO2 23 02/15/2020 1733   BUN 17 02/15/2020 1733   CREATININE 0.75 02/15/2020 1733      Component Value Date/Time   CALCIUM 9.4 02/15/2020 1733   ALKPHOS 75 04/12/2008 0524   AST 32 04/12/2008 0524   ALT 27 04/12/2008 0524   BILITOT 1.2 04/12/2008 0524       DBS programming was performed today which is described in more detail on a separate programming procedure note.    Total time spent on today's visit was 45 minutes, including both face-to-face time and nonface-to-face time.  Time included that spent  on review of records (prior notes available to me/labs/imaging if pertinent), discussing treatment and goals, answering patient's questions and  coordinating care.  This did not include DBS programming time, described in more detail on separate programming procedural note.  Cc:  Jefm Petty, MD

## 2020-02-25 ENCOUNTER — Encounter (HOSPITAL_COMMUNITY): Payer: Medicare Other

## 2020-02-26 ENCOUNTER — Encounter (HOSPITAL_COMMUNITY): Payer: Medicare Other

## 2020-02-26 ENCOUNTER — Other Ambulatory Visit: Payer: Self-pay

## 2020-02-26 ENCOUNTER — Encounter: Payer: Self-pay | Admitting: Vascular Surgery

## 2020-02-26 ENCOUNTER — Ambulatory Visit (INDEPENDENT_AMBULATORY_CARE_PROVIDER_SITE_OTHER): Payer: Medicare Other | Admitting: Vascular Surgery

## 2020-02-26 DIAGNOSIS — I824Y1 Acute embolism and thrombosis of unspecified deep veins of right proximal lower extremity: Secondary | ICD-10-CM | POA: Diagnosis not present

## 2020-02-26 NOTE — Progress Notes (Signed)
ASSESSMENT & PLAN:  74 y.o. male with right lower extremity iliofemoral acute DVT.  No evidence of phlegmasia.  He is not a good candidate for endovascular recanalization given his age, the chronicity of the thrombus, recent neurosurgery, and debility with Parkinson's.  Recommend medical therapy.  Continue Eliquis for at least 3 months.  Compression therapy to the leg.  Elevate leg when not active.  Follow-up as needed.  CHIEF COMPLAINT:   DVT  HISTORY:  HISTORY OF PRESENT ILLNESS: Patrick Ross is a 74 y.o. male with past medical history of Parkinson's disease, status post deep brain stimulator 33/54/5625 which was complicated by infection requiring removal of the device 01/22/2020.  He presented to the ER 02/15/2020 with a week of right leg pain.  He was found to have an iliofemoral DVT.  He was started on Eliquis. He reports mild improvement in his swelling since starting eliquis. He has chronic pain in his lower extremities from PD.   Past Medical History:  Diagnosis Date  . Depression   . DVT (deep venous thrombosis) ([place])   . GERD (gastroesophageal reflux disease)   . History of kidney stones    passed  . Hypertension   . Hypothyroidism   . Lumbar stenosis   . OSA (obstructive sleep apnea)    wears cpap  . Parkinson's disease ([place])   . PONV (postoperative nausea and vomiting)   . REM behavioral disorder   . Urinary incontinence     Past Surgical History:  Procedure Laterality Date  . BACK SURGERY     x 2 - not fusion  . COLONOSCOPY    . DEEP BRAIN STIMULATOR PLACEMENT     left 12/27/2012, right 12/10/2011, left revision 02/02/2013, replacement bilateral 11/21/2015  . MINOR PLACEMENT OF FIDUCIAL N/A 01/09/2019   Procedure: Fiducial placement;  Surgeon: Erline Levine, MD;  Location: [place];  Service: Neurosurgery;  Laterality: N/A;  Fiducial placement  . PARTIAL NEPHRECTOMY Right 2005 ish  . PULSE GENERATOR IMPLANT Right 01/23/2019   Procedure: Right chest implantable  pulse generator change;  Surgeon: Erline Levine, MD;  Location: [place];  Service: Neurosurgery;  Laterality: Right;  . SPINAL FUSION     lumbar Fusion x 2  . SUBTHALAMIC STIMULATOR BATTERY REPLACEMENT Left 02/02/2018   Procedure: Change implantable pulse generator battery, Left chest;  Surgeon: Erline Levine, MD;  Location: [place];  Service: Neurosurgery;  Laterality: Left;  . SUBTHALAMIC STIMULATOR BATTERY REPLACEMENT N/A 01/22/2020   Procedure: REMOVAL OF LEFT BRAIN DEEP BRAIN STIMULATOR ELECTRODE, EXTENSION, AND PULSE GENERATOR;  Surgeon: Erline Levine, MD;  Location: [place];  Service: Neurosurgery;  Laterality: N/A;  . SUBTHALAMIC STIMULATOR INSERTION Left 01/16/2019   Procedure: Left deep brain stimulator electrode revision.;  Surgeon: Erline Levine, MD;  Location: [place];  Service: Neurosurgery;  Laterality: Left;  . SUBTHALAMIC STIMULATOR INSERTION Left 01/23/2019   Procedure: Repositioning of Left Deep Brain Stimulator Electrode;  Surgeon: Erline Levine, MD;  Location: [place];  Service: Neurosurgery;  Laterality: Left;  . TONSILLECTOMY      Family History  Problem Relation Age of Onset  . AAA (abdominal aortic aneurysm) Mother        41 yo  . Heart disease Father   . Diabetes Father   . Heart disease Brother   . Diabetes Brother   . Healthy Child     Social History   Socioeconomic History  . Marital status: Married    Spouse name: Not on file  .  Number of children: 3  . Years of education: Not on file  . Highest education level: Master's degree (e.g., MA, MS, MEng, MEd, MSW, MBA)  Occupational History  . Not on file  Tobacco Use  . Smoking status: Never Smoker  . Smokeless tobacco: Never Used  Vaping Use  . Vaping Use: Never used  Substance and Sexual Activity  . Alcohol use: Not Currently  . Drug use: Never  . Sexual activity: Not on file  Other Topics Concern  . Not on file  Social History Narrative   Right handed   2 story home    Lives with spouse    Social  Determinants of Health   Financial Resource Strain: Not on file  Food Insecurity: Not on file  Transportation Needs: Not on file  Physical Activity: Not on file  Stress: Not on file  Social Connections: Not on file  Intimate Partner Violence: Not on file    Allergies  Allergen Reactions  . Other Other (See Comments)    Surgical spray prior to adhesive causes blisters  . Sulfamethoxazole Other (See Comments)    Unsure--childhood allergy    Current Outpatient Medications  Medication Sig Dispense Refill  . amantadine (SYMMETREL) 100 MG capsule Take 1 capsule (100 mg total) by mouth 2 (two) times daily. 180 capsule 1  . AMBULATORY NON FORMULARY MEDICATION Lift chair Dx:  G20 (Patient taking differently: Lift chair Dx:  G20) 1 Device 0  . apixaban (ELIQUIS) 5 MG TABS tablet Take 1 tablet (5 mg total) by mouth 2 (two) times daily. 2 tablets 2 times a day for first week. 60 tablet 0  . carbidopa-levodopa (SINEMET IR) 25-100 MG tablet 2 at 7am/2 at 10am, 2 at 1 pm, 1 at 4pm, 1 at 7pm 720 tablet 0  . ceFEPime (MAXIPIME) IVPB Inject 2 g into the vein every 8 (eight) hours. Indication:  Osteomyelitis/CNS infection First Dose: No Last Day of Therapy:  03/05/2020 Labs - Once weekly:  CBC/D and BMP, Labs - Every other week:  ESR and CRP Method of administration: IV Push Method of administration may be changed at the discretion of home infusion pharmacist based upon assessment of the patient and/or caregiver's ability to self-administer the medication ordered. (Patient taking differently: Inject 2 g into the vein every 8 (eight) hours. Indication:  Osteomyelitis/CNS infection First Dose: No Last Day of Therapy:  03/05/2020 Labs - Once weekly:  CBC/D and BMP, Labs - Every other week:  ESR and CRP Method of administration: IV Push Method of administration may be changed at the discretion of home infusion pharmacist based upon assessment of the patient and/or caregiver's ability to self-administer  the medication ordered.) 126 Units 0  . clonazePAM (KLONOPIN) 0.5 MG tablet 1.5 tabs at bed 135 tablet 1  . escitalopram (LEXAPRO) 10 MG tablet Take 10 mg by mouth at bedtime.    . famotidine (PEPCID) 20 MG tablet Take 20 mg by mouth 2 (two) times daily.    . Levodopa (INBRIJA) 42 MG CAPS Place 2 capsules into inhaler and inhale 5 (five) times daily as needed (up to 5 times a day as needed). 60 capsule 0  . levothyroxine (SYNTHROID) 125 MCG tablet Take 125 mcg by mouth daily.    Marland Kitchen lisinopril (PRINIVIL,ZESTRIL) 20 MG tablet Take 20 mg by mouth daily.    . mirabegron ER (MYRBETRIQ) 25 MG TB24 tablet Take 25 mg by mouth at bedtime.    Marland Kitchen OVER THE COUNTER MEDICATION Take 2 tablets  by mouth 2 (two) times daily. Alpha CRS otc supplement    . OVER THE COUNTER MEDICATION Take 2 capsules by mouth 2 (two) times daily. Microplex VMz otc supplement    . OVER THE COUNTER MEDICATION Take 2 capsules by mouth 2 (two) times daily. XEOmega 3 oil complex    . OVER THE COUNTER MEDICATION Take 1 tablet by mouth daily. onguard+ otc supplement    . OVER THE COUNTER MEDICATION Take 3 capsules by mouth at bedtime. terrazyme supplement    . OVER THE COUNTER MEDICATION Take 1 tablet by mouth 2 (two) times daily. Bone nutrient otc supplement    . OVER THE COUNTER MEDICATION Take 1 capsule by mouth at bedtime. PB Assist pro/prebiotic    . Testosterone 10 MG/ACT (2%) GEL Place 6 Pump onto the skin daily. 3 pump on each inner thigh     No current facility-administered medications for this visit.    REVIEW OF SYSTEMS:  $RemoveB'[X]'jOvdGfBB$  denotes positive finding, $RemoveBeforeDEI'[ ]'[id number]$  denotes negative finding Cardiac  Comments:  Chest pain or chest pressure:    Shortness of breath upon exertion:    Short of breath when lying flat:    Irregular heart rhythm:        Vascular    Pain in calf, thigh, or hip brought on by ambulation:    Pain in feet at night that wakes you up from your sleep:     Blood clot in your veins: x   Leg swelling:  x        Pulmonary    Oxygen at home:    Productive cough:     Wheezing:         Neurologic    Sudden weakness in arms or legs:     Sudden numbness in arms or legs:     Sudden onset of difficulty speaking or slurred speech:    Temporary loss of vision in one eye:     Problems with dizziness:         Gastrointestinal    Blood in stool:     Vomited blood:         Genitourinary    Burning when urinating:     Blood in urine:        Psychiatric    Major depression:         Hematologic    Bleeding problems:    Problems with blood clotting too easily:        Skin    Rashes or ulcers:        Constitutional    Fever or chills:     PHYSICAL EXAM:   Vitals:   02/26/20 0923  BP: (!) 145/86  Pulse: 87  Resp: 20  Temp: 98.6 F (37 C)  SpO2: 96%  Weight: 230 lb (104.3 kg)  Height: $Remove'6\' 3"'UZZGVmp$  (1.905 m)   Constitutional: Well appearing in no distress. Appears well nourished.  Neurologic: Normal gait and station. CN intact. No weakness. No sensory loss. Psychiatric: Mood and affect symmetric and appropriate. Eyes: No icterus. No conjunctival pallor. Ears, nose, throat: mucous membranes moist. Midline trachea. No carotid bruit. Cardiac: regular rate and rhythm.  Respiratory: unlabored. Abdominal: soft, non-tender, non-distended. No palpable pulsatile abdominal mass. Peripheral vascular:  RLE 2+ edema from toes to thigh  Warm and well perfused  No sensory / motor deficits Extremity: No edema. No cyanosis. No pallor.  Skin: No gangrene. No ulceration.  Lymphatic: No Stemmer's sign. No palpable lymphadenopathy.   DATA  REVIEW:    Most recent CBC CBC Latest Ref Rng & Units 02/15/2020 01/22/2020 01/16/2019  WBC 4.0 - 10.5 K/uL 5.6 5.0 4.5  Hemoglobin 13.0 - 17.0 g/dL 14.7 16.3 15.0  Hematocrit 39.0 - 52.0 % 43.3 50.0 44.4  Platelets 150 - 400 K/uL 160 155 169     Most recent CMP CMP Latest Ref Rng & Units 02/15/2020 01/22/2020 01/16/2019  Glucose 70 - 99 mg/dL 120(H) 95 101(H)   BUN 8 - 23 mg/dL _0 Creatinine 0.61 - 1.24 mg/dL 0.75 0.86 1.08  Sodium 135 - 145 mmol/L 140 140 144  Potassium 3.5 - 5.1 mmol/L 3.7 4.1 4.1  Chloride 98 - 111 mmol/L 103 105 109  CO2 22 - 32 mmol/L _1 Calcium 8.9 - 10.3 mg/dL 9.4 9.0 9.2  Total Protein 6.0 - 8.3 g/dL - - -  Total Bilirubin 0.3 - 1.2 mg/dL - - -  Alkaline Phos 39 - 117 U/L - - -  AST 0 - 37 U/L - - -  ALT 0 - 53 U/L - - -    02/15/20 DVT study  Lower Venous DVT Study   Indications: DVT.    Performing Technologist: Ralene Cork RVT     Examination Guidelines:  A complete evaluation includes B-mode imaging, spectral Doppler, color  Doppler,  and power Doppler as needed of all accessible portions of each vessel.  Bilateral  testing is considered an integral part of a complete examination. Limited  examinations for reoccurring indications may be performed as noted. The  reflux  portion of the exam is performed with the patient in reverse  Trendelenburg.     +---------+---------------+---------+-----------+----------+--------------+   RIGHT  CompressibilityPhasicitySpontaneityPropertiesThrombus  Aging  +---------+---------------+---------+-----------+----------+--------------+   CFV   None      No    No                     +---------+---------------+---------+-----------+----------+--------------+   SFJ   None           No                     +---------+---------------+---------+-----------+----------+--------------+   FV Prox None      No    No                     +---------+---------------+---------+-----------+----------+--------------+   FV Mid  None      No    No                     +---------+---------------+---------+-----------+----------+--------------+   FV DistalNone      No    No                      +---------+---------------+---------+-----------+----------+--------------+   POP   None      No    No                     +---------+---------------+---------+-----------+----------+--------------+   PTV   None           No                     +---------+---------------+---------+-----------+----------+--------------+   Gastroc None      No    No                     +---------+---------------+---------+-----------+----------+--------------+   GSV   None  No    No                     +---------+---------------+---------+-----------+----------+--------------+   Right EIV        No    No                     +---------+---------------+---------+-----------+----------+--------------+   Right CIV        No    No                     +---------+---------------+---------+-----------+----------+--------------+    IVC and proximal CIV are patent.   Findings reported to Dr. Drucilla Schmidt at 4:220pm. Instructed to have patient go  to the Emergency Department.    Summary:  RIGHT:  - Acute DVT from the mid common iliac vein throough the distal posterior  tibial vein and including the gastrocnemious vein. The great saphenous  vein is thrombosed thoughout its length.       *See table(s) above for measurements and observations.   Electronically signed by Monica Martinez MD on 02/16/2020 at 5:23:31 PM.   Yevonne Aline. Stanford Breed, MD [facility] Phone Number: [phone] 02/26/2020 9:28 AM

## 2020-03-03 ENCOUNTER — Other Ambulatory Visit: Payer: Self-pay

## 2020-03-03 ENCOUNTER — Ambulatory Visit (INDEPENDENT_AMBULATORY_CARE_PROVIDER_SITE_OTHER): Payer: Medicare Other | Admitting: Neurology

## 2020-03-03 ENCOUNTER — Encounter: Payer: Self-pay | Admitting: Neurology

## 2020-03-03 VITALS — BP 126/84 | HR 95 | Resp 20 | Ht 74.0 in | Wt 231.0 lb

## 2020-03-03 DIAGNOSIS — G20 Parkinson's disease: Secondary | ICD-10-CM

## 2020-03-03 MED ORDER — CARBIDOPA-LEVODOPA 25-100 MG PO TABS
ORAL_TABLET | ORAL | 1 refills | Status: DC
Start: 2020-03-03 — End: 2020-04-11

## 2020-03-03 NOTE — Patient Instructions (Signed)
Take carbidopa/levodopa 25/100, 2 at 7am, 2 at 9:30am, 2 at noon, 2 at 2:30 pm, 2 at 5pm.  If you need an extra late evening, you may take 1 extra  Continue amantadine and klonopin as you were.

## 2020-03-03 NOTE — Procedures (Signed)
DBS Programming was performed.    Manufacturer of DBS device: [employer]tronic  Total time spent programming was 25  minutes.  Device was confirmed to be on.  Soft start was confirmed to be on.  Impedences were checked and were within normal limits.  Battery was checked and was determined to be functioning normally and not near the end of life.  Final settings were as follows with group A active:   Active Contact Amplitude (V) PW (ms) Frequency (hz) Side Effects Battery  Left Brain        Group A        12/20/19 3-C+ 3.1 70 140  2.89  01/29/20 N/A       03/03/20 N/A               Group B        12/20/19 2-C+ 2.0 70 130            Right Brain        Group A        12/20/19 3-C+ 3.2 90 135  2.94  01/29/20 3-C+ 3.2 (2.4-3.6) 90 135  2.94  03/03/20 3-C+ 3.2 90 135  2.94

## 2020-03-04 ENCOUNTER — Telehealth: Payer: Self-pay

## 2020-03-04 NOTE — Telephone Encounter (Signed)
Received voicemail from Minerva Ends at [facility] requesting verbal orders for continuation and recertification. He would ike for the patient to have therapy 2 times a week for 4 more weeks.   He is also requesting that Dr Tat over the original order from 01/25/2020 (I believe the order was placed by the [facility].) is this ok to give verbal orders.

## 2020-03-04 NOTE — Telephone Encounter (Signed)
Left message for Patrick Ross to contact office.

## 2020-03-04 NOTE — Telephone Encounter (Signed)
ok 

## 2020-03-05 NOTE — Telephone Encounter (Signed)
Recieved call from Patrick Ross and gave him verbal orders. He states the patient is doing well.

## 2020-03-10 ENCOUNTER — Telehealth: Payer: Self-pay | Admitting: Neurology

## 2020-03-10 NOTE — Telephone Encounter (Signed)
Is this ok?

## 2020-03-10 NOTE — Telephone Encounter (Signed)
Verbal left on Patrick Ross's voicemail. Advised a call back with any questions or concerns.

## 2020-03-10 NOTE — Telephone Encounter (Signed)
yes

## 2020-03-10 NOTE — Telephone Encounter (Signed)
Patrick Ross called to request verbal order for patient to have OT 1x/week for 6 weeks.

## 2020-03-11 ENCOUNTER — Other Ambulatory Visit: Payer: Self-pay | Admitting: Neurosurgery

## 2020-03-14 ENCOUNTER — Other Ambulatory Visit: Payer: Self-pay

## 2020-03-14 ENCOUNTER — Encounter: Payer: Self-pay | Admitting: Internal Medicine

## 2020-03-14 ENCOUNTER — Other Ambulatory Visit: Payer: Self-pay | Admitting: Neurosurgery

## 2020-03-14 ENCOUNTER — Ambulatory Visit (INDEPENDENT_AMBULATORY_CARE_PROVIDER_SITE_OTHER): Payer: Medicare Other | Admitting: Internal Medicine

## 2020-03-14 VITALS — BP 150/94 | HR 80 | Wt 230.0 lb

## 2020-03-14 DIAGNOSIS — M869 Osteomyelitis, unspecified: Secondary | ICD-10-CM | POA: Diagnosis present

## 2020-03-14 DIAGNOSIS — I82A11 Acute embolism and thrombosis of right axillary vein: Secondary | ICD-10-CM

## 2020-03-14 NOTE — Patient Instructions (Signed)
You have finished your antibiotics regimen. At this time, continue to monitor for sign of infection relapse  This would include increasing headache, confusion, focal pain where the stimulator was placed, and potentially also fever/chill. The risk for relapse is greatest within the first few weeks after antibiotics are finished.   This is just precaution as I believe your treatment is very well treated   Follow up with your regular doctor to make sure you stop the blood thinner at around 6 months duration  I agree with compression stocking for the right leg swelling as well  If you have any concern, please give our clinic a call

## 2020-03-14 NOTE — Progress Notes (Signed)
[place] for Infectious Disease  Patient Active Problem List   Diagnosis Date Noted  . DVT, lower extremity, proximal, acute, right ([place]) 02/26/2020  . Open scalp wound 01/22/2020  . 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[place])   . 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 Parkinson's disease ([place]) 01/16/2018  . 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   MRN: [id number]  Chief Complaint  Patient presents with  . Follow-up    PICC removed 11/12; no complaints;    HPI:  Patrick Ross is a 75 y.o. male parkinson's disease s/p bilateral brain stimulator placement but complicated by a few displacement of left device requiring adjustment, ultimately admitted 01/22/2020 for left device insertion site infection after a few weeks of focal cellulitic change not responding to oral abx and topical gent  Presurgical swab cx grew pseudomonas  He underwent 11/23 I&d and left dbs extraciton  Never had s/s of meningoencephalitis; no csf sampled  12/16 id visit Doing well; sinemet dosing increased after November admission after dbs removed on left side. Had trouble with initiating movement right side since dbs removed and noted rle swelling No f/c/n/v/diarrhea/rash No confusion No headache On cefepime  Labs reviewed no aki/lft abnormality; crp improving  03/14/20 id visit Placed on eliquis for rle dvt No headache, n/v/diarrhea Finished iv abx 10 days ago Reviewed labs from [facility] To f/u neurosurgery for new dbs 06/2020 after anticoagulation course  Allergies  Allergen Reactions  . Other Other (See Comments)    Surgical spray prior to adhesive causes blisters  . Sulfamethoxazole Other (See Comments)    Unsure--childhood allergy      Outpatient Medications Prior to Visit  Medication Sig Dispense Refill  . amantadine (SYMMETREL) 100 MG capsule Take  1 capsule (100 mg total) by mouth 2 (two) times daily. 180 capsule 1  . AMBULATORY NON FORMULARY MEDICATION Lift chair Dx:  G20 (Patient taking differently: Lift chair Dx:  G20) 1 Device 0  . apixaban (ELIQUIS) 5 MG TABS tablet Take 1 tablet (5 mg total) by mouth 2 (two) times daily. 2 tablets 2 times a day for first week. 60 tablet 0  . carbidopa-levodopa (SINEMET IR) 25-100 MG tablet 2 at 7am, 2 at 9:30am, 2 at noon, 2 at 2:30 pm, 2 at 5pm.  If you need an extra late evening, you may take 1 extra 900 tablet 1  . clonazePAM (KLONOPIN) 0.5 MG tablet 1.5 tabs at bed 135 tablet 1  . escitalopram (LEXAPRO) 10 MG tablet Take 10 mg by mouth at bedtime.    . famotidine (PEPCID) 20 MG tablet Take 20 mg by mouth 2 (two) times daily.    . Levodopa (INBRIJA) 42 MG CAPS Place 2 capsules into inhaler and inhale 5 (five) times daily as needed (up to 5 times a day as needed). 60 capsule 0  . levothyroxine (SYNTHROID) 125 MCG tablet Take 125 mcg by mouth daily.    Marland Kitchen lisinopril (PRINIVIL,ZESTRIL) 20 MG tablet Take 20 mg by mouth daily.    . mirabegron ER (MYRBETRIQ) 25 MG TB24 tablet Take 25 mg by mouth at bedtime.    Marland Kitchen OVER THE COUNTER MEDICATION Take 2 tablets by mouth 2 (two) times daily. Alpha CRS otc supplement (Patient not taking: Reported on 03/03/2020)    . OVER THE COUNTER MEDICATION  Take 2 capsules by mouth 2 (two) times daily. Microplex VMz otc supplement    . OVER THE COUNTER MEDICATION Take 2 capsules by mouth 2 (two) times daily. XEOmega 3 oil complex    . OVER THE COUNTER MEDICATION Take 1 tablet by mouth daily. onguard+ otc supplement    . OVER THE COUNTER MEDICATION Take 3 capsules by mouth at bedtime. terrazyme supplement    . OVER THE COUNTER MEDICATION Take 1 tablet by mouth 2 (two) times daily. Bone nutrient otc supplement    . OVER THE COUNTER MEDICATION Take 1 capsule by mouth at bedtime. PB Assist pro/prebiotic    . Testosterone 10 MG/ACT (2%) GEL Place 6 Pump onto the skin daily. 3 pump on  each inner thigh     No facility-administered medications prior to visit.     Social History   Socioeconomic History  . Marital status: Married    Spouse name: Not on file  . Number of children: 3  . Years of education: Not on file  . Highest education level: Master's degree (e.g., MA, MS, MEng, MEd, MSW, MBA)  Occupational History  . Not on file  Tobacco Use  . Smoking status: Never Smoker  . Smokeless tobacco: Never Used  Vaping Use  . Vaping Use: Never used  Substance and Sexual Activity  . Alcohol use: Not Currently  . Drug use: Never  . Sexual activity: Not on file  Other Topics Concern  . Not on file  Social History Narrative   Right handed   2 story home    Lives with spouse    Social Determinants of Health   Financial Resource Strain: Not on file  Food Insecurity: Not on file  Transportation Needs: Not on file  Physical Activity: Not on file  Stress: Not on file  Social Connections: Not on file  Intimate Partner Violence: Not on file      Review of Systems   negative 11 point ros unless mentioned above  Objective:    BP (!) 150/94   Pulse 80   Wt 230 lb (104.3 kg)   BMI 29.53 kg/m  Nursing note and vital signs reviewed.  Physical Exam Patient here with wife He is in wheel chair Heent: left frontotemporal area and post-auricular area surgical incision all healed no fluctuance, swelling, tenderness; normocephalic otherwise; per; conj clear Neck supple cv rrr no mrg Skin no rash Ext RLE edema stable from last visit; nontender Neuro: stiff extremities with mild resting tremor of right UE  Psych alert/oriented    Labs: Reviewed on [facility] website 03/03/20 cbc 6/13/174; cr 0.7  Esr/crp normal 01/19/20 (has been normal since beginning of treatment in 12/2019)  Micro:  Serology:  Imaging:  Assessment & Plan:   Problem List Items Addressed This Visit   None   Visit Diagnoses    Osteomyelitis, unspecified site, unspecified type  ([place])    -  Primary   DVT of axillary vein, acute right ([place])         Abx: 11/23-c cefepime  Prior to admission doxycycline and topical gentamycin     Assessment: 75 yo male OSA, anxiety, GERD, hypothyroidism, lumbar radiculopathy s/p lumbar fusion 2014, parkinson's disease, s/p right deep brain stimulator placement 11/2011 and left DBS 59/7416, complicated by displacement of left DBS needing revision 01/2013 and again 12/2018, and most recently soft tissue infection with exposed electrode of the left DBS with pseudomonas admitted 11/23 for semi-elective I&D and s/p hardware removal. He presented to  ID clinic for f/u  Of note he had been receiving doxycycline and getting topical gentamycin from his dermatology office for the soft tissue infection process  Given the exposed hardware, which connects through bone into the cns area, will treat as Osteomyelitis which will cover for any potential nosocomial meningitis  No recent cns imaging but doesn't appear to exhibit intracranial s/s of meningitis/encephalitis  Pseudomonas growing from wound cx Patient clinically doing well on cefepime, pending discharge home today  I don't have his prior wound swab culture yet; had asked dr Vertell Limber yesterday; will request again once he is in clinic with [place]  12/16 id outpatient f/u Clinically doing well; right sided decreased motor function likely due to parkinson/removal of dbs. Right le swelling likely same; but will r/o dvt No abx adverse effect crp improving  03/14/20 outpatient assessment Continues to do well 10 days off abx. Right le dvt dx'ed last visit, on anticoagulation Advise patient to monitor for infectious relapse (fever/chill, headache, confusion, focal pain in scalp) to discuss with [place]  Plan: 1. F/u as needed with [place] 2. F/u nsg as requested by them for new DBS placement later this year   I am having Kieffer C. Mara "Charlie"  maintain his lisinopril, levothyroxine, Testosterone, OVER THE COUNTER MEDICATION, OVER THE COUNTER MEDICATION, OVER THE COUNTER MEDICATION, OVER THE COUNTER MEDICATION, OVER THE COUNTER MEDICATION, escitalopram, clonazePAM, Inbrija, amantadine, famotidine, mirabegron ER, OVER THE COUNTER MEDICATION, OVER THE COUNTER MEDICATION, AMBULATORY NON FORMULARY MEDICATION, apixaban, and carbidopa-levodopa.   No orders of the defined types were placed in this encounter.    Follow-up: Return if symptoms worsen or fail to improve.      Jabier Mutton, [place] for Infectious Disease [place] -- -- pager   [phone] cell 03/14/2020, 11:28 AM

## 2020-03-17 ENCOUNTER — Other Ambulatory Visit: Payer: Self-pay | Admitting: Neurosurgery

## 2020-03-17 ENCOUNTER — Other Ambulatory Visit (HOSPITAL_COMMUNITY): Payer: Self-pay | Admitting: Neurosurgery

## 2020-03-17 DIAGNOSIS — G20 Parkinson's disease: Secondary | ICD-10-CM

## 2020-03-19 ENCOUNTER — Other Ambulatory Visit: Payer: Self-pay | Admitting: Neurology

## 2020-03-19 MED ORDER — CLONAZEPAM 0.5 MG PO TABS: ORAL_TABLET | ORAL | 1 refills | Status: DC

## 2020-03-19 NOTE — Telephone Encounter (Signed)
Patient's wife called in stating it is time to refill the patient's Klonopin. She wants to go to a local pharmacy to be able to better manage it. Please send refill to [facility] on [address]. [place].

## 2020-04-01 ENCOUNTER — Telehealth: Payer: Self-pay

## 2020-04-01 NOTE — Telephone Encounter (Signed)
Verbal orders left on [name]'s voicemail. Advised him to contact the office with any questions or concerns.

## 2020-04-01 NOTE — Telephone Encounter (Signed)
ok 

## 2020-04-01 NOTE — Telephone Encounter (Signed)
Received a call from Brandon at [facility] n regards to patient. He wants patient to continue therapy for gait training, strength training, to shower. He states the patient is looking fabulous. He said he wants to do 2 times a week for 4 weeks to get the patient to [facility] for out patient therapy at the end of the 4 weeks.   Call back number: [phone]  Is this ok?

## 2020-04-04 ENCOUNTER — Telehealth: Payer: Self-pay

## 2020-04-04 DIAGNOSIS — G20 Parkinson's disease: Secondary | ICD-10-CM

## 2020-04-04 NOTE — Telephone Encounter (Signed)
ok 

## 2020-04-04 NOTE — Telephone Encounter (Signed)
Received a voicemail from [place] at [facility] yesterday requesting orders for PT/OTand ST.   Is this ok. Per last message patient will be transitioning to therapy at there facility from home PT.   Is this ok?

## 2020-04-04 NOTE — Telephone Encounter (Signed)
Orders placed.

## 2020-04-11 ENCOUNTER — Telehealth: Payer: Self-pay | Admitting: Neurology

## 2020-04-11 MED ORDER — CARBIDOPA-LEVODOPA 25-100 MG PO TABS: ORAL_TABLET | ORAL | 0 refills | Status: DC

## 2020-04-11 NOTE — Telephone Encounter (Signed)
Rx(s) sent to pharmacy electronically.  

## 2020-05-01 ENCOUNTER — Telehealth: Payer: Self-pay | Admitting: Neurology

## 2020-05-01 NOTE — Telephone Encounter (Signed)
Spoke with patients spouse and made her aware of Dr Doristine Devoid recommendations. She voiced understanding.   She states the patient is still doing physical therapy.

## 2020-05-01 NOTE — Telephone Encounter (Signed)
Probably is going to need to f/u with PCP for bowels (presume constipation??).  He has an appt in few weeks.  We can get him on cx list if he wants but not sure I can get him in before then.  Is he still in PT?

## 2020-05-01 NOTE — Telephone Encounter (Signed)
Patient is having trouble with moving around and standing up. His shoulders hunched over and having troubles with his bowels please call  He has appt with Tat on 05-19-20

## 2020-05-15 NOTE — Progress Notes (Signed)
Assessment/Plan:   1.  Parkinsons Disease             -The patient underwent right STN DBS on December 10, 2011 and a left STN DBS on December 27, 2012. Postoperatively, his left STN DBS lead was found to be displaced and had revision of the left lead on February 02, 2013 (pulled back 1 cm without MER). IPGon the RIGHTchanged on 01/23/19. IPG on the left last changed on 02/02/18 -Patient underwent lead revision on January 16, 2019 on the left, as the lead was still displaced/deep. It was pulled back 7.5 mm. Postoperative imaging indicated that it was still 5 mm toodeep, so when the battery was changed to the following week, it was pulled back another 5 mm.  Postoperative imaging indicates that the leads were in good place, but the patient hit his head on the corner of a wall and developed an abrasion over the lead, which ultimately exposed to lead.  This led to a wound infection, requiring that the entire system on the left be removed on January 22, 2020.    -Goal was to replace the left lead, if able to be off of Eliquis, in May, 2021.  He would need new preop MRI.  I talked to him today about the worry about replacing that lead, primarily that the integrity of that skin overlying the scalp in that location is poor, and the likelihood of it breaking down again is high.  In addition, the patient actually looked quite well today, and I am not sure that the benefits outweigh the risks.  Patient agrees, even though he does not feel that he is completely optimized.  He is going to attend an appointment with Dr. Vertell Limber on Thursday and discussed this just a little bit more.  -Continue carbidopa/levodopa 25/100, 2 at 7am, 2 at 9:30am, 2 at noon, 2 at 2:30 pm, 2 at 5pm.  Discussed with him that he really needs to take an extra half tablet to 1 tablet as needed for off episodes or for dose failure.  -Continue amantadine, 100 mg twice per day  -Continue Inbrija as needed, up to 5 times per day,  separated by 2-hour intervals.  Told him that he may need to schedule this for first morning on and perhaps 2 other times during the day.  -Patient needs to continue exercise.  He actually looked quite good today.   2.  RBD             -Continue clonazepam, 0.5 mg, 1.5 tablets at night.    3.  Urinary incontinence             -Following with Dr. Felipa Eth in [facility] (urology), who started him on Myrbetriq in September, 2021.  4.  Low back pain             -Follows with Dr. Letta Pate as well as [facility] neurosurgery.  He is having more difficulties with his back since his DBS was removed (not able to move nearly as well)  5.  DVT  -on Eliquis  -Goal is to be off of Eliquis by the end of this month.   Subjective:   Patrick Ross was seen today.  Patient accompanied by wife who supplements history.  Last saw primary care on January 19.  This was a telemedicine visit.  Primary care is managing Eliquis.  Last notes indicate that the hope was to stop the Eliquis at the end of March.  I talked to Dr. Vertell Limber today.  He is willing, but hesitant, to replace the DBS electrode on the left.  This is primarily because of worry about skin integrity and increased risk for repeat skin breakdown.  No falls since last visit - some near falls.  PT at [facility] - going 2 x per week.  Going to start cycle class at [facility]  Current prescribed movement disorder medications: carbidopa/levodopa 25/100, 2 at 7am, 2 at 9:30am, 2 at noon, 2 at 2:30 pm, 2 at 5pm and an extra as needed (increased last visit, not using the extra) Clonazepam 0.5 mg, 1-1/2 tablets at bedtime Amantadine, 100 mg twice per day  Inbrija (using qd-bid)   ALLERGIES:   Allergies  Allergen Reactions  . Other Other (See Comments)    Surgical spray prior to adhesive causes blisters  . Sulfamethoxazole Other (See Comments)    Unsure--childhood allergy    CURRENT MEDICATIONS:  Outpatient Encounter Medications as of 05/19/2020   Medication Sig  . amantadine (SYMMETREL) 100 MG capsule Take 1 capsule (100 mg total) by mouth 2 (two) times daily.  . AMBULATORY NON FORMULARY MEDICATION Lift chair Dx:  G20 (Patient taking differently: Lift chair Dx:  G20)  . carbidopa-levodopa (SINEMET IR) 25-100 MG tablet 2 at 7am, 2 at 9:30am, 2 at noon, 2 at 2:30 pm, 2 at 5pm.  If you need an extra late evening, you may take 1 extra  . clonazePAM (KLONOPIN) 0.5 MG tablet 1.5 tabs at bed  . escitalopram (LEXAPRO) 10 MG tablet Take 10 mg by mouth at bedtime.  . famotidine (PEPCID) 20 MG tablet Take 20 mg by mouth 2 (two) times daily.  . Levodopa (INBRIJA) 42 MG CAPS Place 2 capsules into inhaler and inhale 5 (five) times daily as needed (up to 5 times a day as needed).  Marland Kitchen levothyroxine (SYNTHROID) 125 MCG tablet Take 125 mcg by mouth daily.  Marland Kitchen lisinopril (PRINIVIL,ZESTRIL) 20 MG tablet Take 20 mg by mouth daily.  . mirabegron ER (MYRBETRIQ) 25 MG TB24 tablet Take 25 mg by mouth at bedtime.  Marland Kitchen OVER THE COUNTER MEDICATION Take 2 tablets by mouth 2 (two) times daily. Alpha CRS otc supplement  . OVER THE COUNTER MEDICATION Take 2 capsules by mouth 2 (two) times daily. Microplex VMz otc supplement  . OVER THE COUNTER MEDICATION Take 2 capsules by mouth 2 (two) times daily. XEOmega 3 oil complex  . OVER THE COUNTER MEDICATION Take 1 tablet by mouth daily. onguard+ otc supplement  . OVER THE COUNTER MEDICATION Take 3 capsules by mouth at bedtime. terrazyme supplement  . OVER THE COUNTER MEDICATION Take 1 tablet by mouth 2 (two) times daily. Bone nutrient otc supplement  . OVER THE COUNTER MEDICATION Take 1 capsule by mouth at bedtime. PB Assist pro/prebiotic  . rosuvastatin (CRESTOR) 20 MG tablet Take 20 mg by mouth daily.  . Testosterone 10 MG/ACT (2%) GEL Place 6 Pump onto the skin daily. 3 pump on each inner thigh  . apixaban (ELIQUIS) 5 MG TABS tablet Take 1 tablet (5 mg total) by mouth 2 (two) times daily. 2 tablets 2 times a day for first  week.   No facility-administered encounter medications on file as of 05/19/2020.    Objective:   PHYSICAL EXAMINATION:    VITALS:   Vitals:   05/19/20 1255  BP: (!) 142/84  Pulse: 91  SpO2: 98%  Weight: 233 lb (105.7 kg)  Height: 6\' 2"  (1.88 m)    GEN:  The patient  appears stated age and is in NAD. HEENT:  Normocephalic.  Scalp incision is well-healed.  The mucous membranes are moist. The superficial temporal arteries are without ropiness or tenderness. CV:  RRR Lungs:  CTAB Neck/HEME:  There are no carotid bruits bilaterally.  Neurological examination:  Orientation: The patient is alert and oriented x3. Cranial nerves: There is good facial symmetry with facial hypomimia. The speech is fluent and clear.  He is much less dysarthric than previous. Soft palate rises symmetrically and there is no tongue deviation. Hearing is intact to conversational tone. Sensation: Sensation is intact to light touch throughout Motor: Strength is at least antigravity x4.  Movement examination: Tone: There is nl tone in the UE/LE Abnormal movements: none Coordination:  There is mild decremation only with foot taps/toe taps on the right Gait and Station: The patient is able to get up out of his lchair by himself after rocking out.  Some start hesitation.  Walks well in the hall. Freezes with R leg in the turn.  Does well after the turn.  I have reviewed and interpreted the following labs independently   Chemistry      Component Value Date/Time   NA 140 02/15/2020 1733   K 3.7 02/15/2020 1733   CL 103 02/15/2020 1733   CO2 23 02/15/2020 1733   BUN 17 02/15/2020 1733   CREATININE 0.75 02/15/2020 1733      Component Value Date/Time   CALCIUM 9.4 02/15/2020 1733   ALKPHOS 75 04/12/2008 0524   AST 32 04/12/2008 0524   ALT 27 04/12/2008 0524   BILITOT 1.2 04/12/2008 0524       DBS programming was performed today which is described in more detail on a separate programming procedure note.     Total time spent on today's visit was 40 minutes, including both face-to-face time and nonface-to-face time.  Time included that spent on review of records (prior notes available to me/labs/imaging if pertinent), discussing treatment and goals, answering patient's questions and coordinating care.  This did not include DBS programming time, described in more detail on separate programming procedural note.  Cc:  Jefm Petty, MD

## 2020-05-19 ENCOUNTER — Encounter: Payer: Self-pay | Admitting: Neurology

## 2020-05-19 ENCOUNTER — Other Ambulatory Visit: Payer: Self-pay

## 2020-05-19 ENCOUNTER — Ambulatory Visit (INDEPENDENT_AMBULATORY_CARE_PROVIDER_SITE_OTHER): Payer: Medicare Other | Admitting: Neurology

## 2020-05-19 DIAGNOSIS — G20 Parkinson's disease: Secondary | ICD-10-CM

## 2020-05-19 NOTE — Procedures (Signed)
DBS Programming was performed.    Manufacturer of DBS device: Medtronic  Total time spent programming was 5  minutes.  Device was confirmed to be on.  Soft start was confirmed to be on.  Impedences were checked and were within normal limits.  Battery was checked and was determined to be functioning normally and not near the end of life.  Final settings were as follows with group A active:   Active Contact Amplitude (V) PW (ms) Frequency (hz) Side Effects Battery  Left Brain        Group A        12/20/19 3-C+ 3.1 70 140  2.89  01/29/20 N/A       03/03/20 N/A       05/19/20 n/a                               Group B        12/20/19 2-C+ 2.0 70 130            Right Brain        Group A        12/20/19 3-C+ 3.2 90 135  2.94  01/29/20 3-C+ 3.2 (2.4-3.6) 90 135  2.94  03/03/20 3-C+ 3.2 90 135  2.94  05/19/20 3-C+ 3.2 (2.4-3.6) 90 135  2.92

## 2020-05-19 NOTE — Patient Instructions (Signed)
Let me know if you decide to proceed with surgery but for now, we will plan on holding and cancelling that surgery.  I will wait to cancel it until the end of the week but will cancel it then.  The physicians and staff at [facility] are committed to providing excellent care. You may receive a survey requesting feedback about your experience at our office. We strive to receive "very good" responses to the survey questions. If you feel that your experience would prevent you from giving the office a "very good " response, please contact our office to try to remedy the situation. We may be reached at [phone]. Thank you for taking the time out of your busy day to complete the survey.

## 2020-06-30 ENCOUNTER — Ambulatory Visit (INDEPENDENT_AMBULATORY_CARE_PROVIDER_SITE_OTHER): Payer: Medicare Other | Admitting: Psychology

## 2020-06-30 DIAGNOSIS — F0632 Mood disorder due to known physiological condition with major depressive-like episode: Secondary | ICD-10-CM

## 2020-07-08 ENCOUNTER — Encounter (HOSPITAL_COMMUNITY): Payer: Self-pay

## 2020-07-08 ENCOUNTER — Ambulatory Visit (HOSPITAL_COMMUNITY): Payer: Medicare Other

## 2020-07-08 ENCOUNTER — Other Ambulatory Visit: Payer: Self-pay | Admitting: Neurology

## 2020-07-09 ENCOUNTER — Other Ambulatory Visit: Payer: Self-pay

## 2020-07-09 MED ORDER — CARBIDOPA-LEVODOPA 25-100 MG PO TABS: ORAL_TABLET | ORAL | 0 refills | Status: DC

## 2020-08-19 ENCOUNTER — Ambulatory Visit: Admit: 2020-08-19 | Payer: Medicare Other | Admitting: Neurosurgery

## 2020-08-19 ENCOUNTER — Ambulatory Visit (HOSPITAL_COMMUNITY): Payer: Medicare Other

## 2020-08-19 ENCOUNTER — Ambulatory Visit (INDEPENDENT_AMBULATORY_CARE_PROVIDER_SITE_OTHER): Payer: Medicare Other | Admitting: Psychology

## 2020-08-19 ENCOUNTER — Encounter (HOSPITAL_COMMUNITY): Payer: Self-pay

## 2020-08-19 DIAGNOSIS — F0632 Mood disorder due to known physiological condition with major depressive-like episode: Secondary | ICD-10-CM

## 2020-08-19 SURGERY — MINOR PLACEMENT OF FIDUCIAL
Anesthesia: LOCAL

## 2020-08-28 ENCOUNTER — Inpatient Hospital Stay: Admit: 2020-08-28 | Payer: Medicare Other | Admitting: Neurosurgery

## 2020-08-28 SURGERY — SUBTHALAMIC STIMULATOR INSERTION
Anesthesia: Moderate Sedation | Laterality: Left

## 2020-09-04 ENCOUNTER — Ambulatory Visit: Admit: 2020-09-04 | Payer: Medicare Other | Admitting: Neurosurgery

## 2020-09-04 SURGERY — UNILATERAL PULSE GENERATOR IMPLANT
Anesthesia: General | Laterality: Left

## 2020-09-15 NOTE — Progress Notes (Signed)
Assessment/Plan:   1.  Parkinsons Disease             -The patient underwent right STN DBS on December 10, 2011 and a left STN DBS on December 27, 2012.  Postoperatively, his left STN DBS lead was found to be displaced and had revision of the left lead on February 02, 2013 (pulled back 1 cm without MER).   IPG on the RIGHT changed on 01/23/19.  IPG on the left last changed on 02/02/18             -Patient underwent lead revision on January 16, 2019 on the left, as the lead was still displaced/deep.  It was pulled back 7.5 mm.  Postoperative imaging indicated that it was still 5 mm too deep, so when the battery was changed to the following week, it was pulled back another 5 mm.  Postoperative imaging indicates that the leads were in good place, but the patient hit his head on the corner of a wall and developed an abrasion over the lead, which ultimately exposed to lead.  This led to a wound infection, requiring that the entire system on the left be removed on January 22, 2020.    -asks about putting the L STN dbs back in.  We discussed risks and benefits.  I really do not think he would get great benefit, since he is actually fairly well controlled without it.  Discussed the risk, because his skin integrity is not good in that area.  He says he wants it back in primarily for right leg pain, and I worry that it is a consequence of a back issue as opposed to a Parkinson's issue.  -going to [employer] for biking 2 days per week  -Continue carbidopa/levodopa 25/100, 2 at 7am, 2 at 9:30am, 2 at noon, 2 at 2:30 pm, 2 at 5pm.  Discussed with him that he really needs to take an extra half tablet to 1 tablet as needed for off episodes or for dose failure.  -Continue amantadine, 100 mg twice per day  -Continue Inbrija as needed, up to 5 times per day, separated by 2-hour intervals.  Told him that he may need to schedule this for first morning on and perhaps 2 other times during the day.     2.  RBD              -Continue clonazepam, 0.5 mg, 1.5 tablets at night.     3.  Urinary incontinence             -was following with Dr. Felipa Eth in [facility] (urology), who started him on Myrbetriq in September, 2021.  He has followed up with a PA after that and were unsatisfied.  They are going to try and follow up with the [facility] medical care   4.  Low back pain             -Follows with Dr. Letta Pate as well as [facility] neurosurgery.  He is having more difficulties with his back since his DBS was removed (not able to move nearly as well).  I think that the right leg pain may be from this as opposed to Parkinson's disease.  He was told to try an extra levodopa when he has it, and if it does not work, he needs to get back to see either Dr. Prince Rome or Dr. Letta Pate.  5.  Depression  -increase lexapro 20 mg daily.  I think that this  is actually a big factor in his feeling of wellbeing, or lack thereof.  We discussed this today.     Subjective:   HEMI CHACKO was seen today.  Patient accompanied by wife who supplements history. One fall - had clogs on and tripped over the stoop on the front door and hadn't fallen all way down. No hallucinations.  No lightheadedness or near syncope.  Uses Inbrija 5 times per week per wife.  Last saw primary care April 27 which was a telemedicine visit.  Those notes are reviewed.  Patient is now off of Eliquis.  Walking and biking at [facility] [employer] 2 days per week.  Current prescribed movement disorder medications: carbidopa/levodopa 25/100, 2 at 7am, 2 at 9:30am, 2 at noon, 2 at 2:30 pm, 2 at 5pm and an extra as needed  Clonazepam 0.5 mg, 1-1/2 tablets at bedtime Amantadine, 100 mg twice per day  Inbrija (using qd-bid)   ALLERGIES:   Allergies  Allergen Reactions   Other Other (See Comments)    Surgical spray prior to adhesive causes blisters   Sulfamethoxazole Other (See Comments)    Unsure--childhood allergy    CURRENT MEDICATIONS:  Outpatient Encounter  Medications as of 09/16/2020  Medication Sig   AMBULATORY NON FORMULARY MEDICATION Lift chair Dx:  G20 (Patient taking differently: Lift chair Dx:  G20)   escitalopram (LEXAPRO) 10 MG tablet Take 10 mg by mouth at bedtime.   famotidine (PEPCID) 20 MG tablet Take 20 mg by mouth 2 (two) times daily.   Levodopa (INBRIJA) 42 MG CAPS Place 2 capsules into inhaler and inhale 5 (five) times daily as needed (up to 5 times a day as needed).   levothyroxine (SYNTHROID) 125 MCG tablet Take 125 mcg by mouth daily.   lisinopril (PRINIVIL,ZESTRIL) 20 MG tablet Take 20 mg by mouth daily.   mirabegron ER (MYRBETRIQ) 25 MG TB24 tablet Take 25 mg by mouth at bedtime.   OVER THE COUNTER MEDICATION Take 2 tablets by mouth 2 (two) times daily. Alpha CRS otc supplement   OVER THE COUNTER MEDICATION Take 2 capsules by mouth 2 (two) times daily. Microplex VMz otc supplement   OVER THE COUNTER MEDICATION Take 2 capsules by mouth 2 (two) times daily. XEOmega 3 oil complex   OVER THE COUNTER MEDICATION Take 1 tablet by mouth daily. onguard+ otc supplement   OVER THE COUNTER MEDICATION Take 3 capsules by mouth at bedtime. terrazyme supplement   OVER THE COUNTER MEDICATION Take 1 tablet by mouth 2 (two) times daily. Bone nutrient otc supplement   OVER THE COUNTER MEDICATION Take 1 capsule by mouth at bedtime. PB Assist pro/prebiotic   rosuvastatin (CRESTOR) 20 MG tablet Take 20 mg by mouth daily.   [DISCONTINUED] amantadine (SYMMETREL) 100 MG capsule TAKE 1 CAPSULE TWICE A DAY   [DISCONTINUED] carbidopa-levodopa (SINEMET IR) 25-100 MG tablet 2 at 7am, 2 at 9:30am, 2 at noon, 2 at 2:30 pm, 2 at 5pm.  If you need an extra late evening, you may take 1 extra   [DISCONTINUED] clonazePAM (KLONOPIN) 0.5 MG tablet 1.5 tabs at bed   amantadine (SYMMETREL) 100 MG capsule Take 1 capsule (100 mg total) by mouth 2 (two) times daily.   carbidopa-levodopa (SINEMET IR) 25-100 MG tablet 2 at 7am, 2 at 9:30am, 2 at noon, 2 at 2:30 pm, 2 at  5pm.  If you need an extra late evening, you may take 1 extra   clonazePAM (KLONOPIN) 0.5 MG tablet 1.5 tabs at bed   [DISCONTINUED]  apixaban (ELIQUIS) 5 MG TABS tablet Take 1 tablet (5 mg total) by mouth 2 (two) times daily. 2 tablets 2 times a day for first week. (Patient not taking: Reported on 09/16/2020)   [DISCONTINUED] Testosterone 10 MG/ACT (2%) GEL Place 6 Pump onto the skin daily. 3 pump on each inner thigh (Patient not taking: Reported on 09/16/2020)   No facility-administered encounter medications on file as of 09/16/2020.    Objective:   PHYSICAL EXAMINATION:    VITALS:   Vitals:   09/16/20 1320  BP: 132/72  Pulse: 92  SpO2: 95%  Weight: 227 lb (103 kg)  Height: 6\' 2"  (1.88 m)     GEN:  The patient appears stated age and is in NAD. HEENT:  Normocephalic.  Scalp incision is well-healed.  The mucous membranes are moist. The superficial temporal arteries are without ropiness or tenderness. CV:  RRR Lungs:  CTAB Neck/HEME:  There are no carotid bruits bilaterally.  Neurological examination:  Orientation: The patient is alert and oriented x3. Cranial nerves: There is good facial symmetry with facial hypomimia. The speech is fluent and clear.  He is much less dysarthric than previous. Soft palate rises symmetrically and there is no tongue deviation. Hearing is intact to conversational tone. Sensation: Sensation is intact to light touch throughout Motor: Strength is at least antigravity x4.  Movement examination: Tone: There is mild increased tone in the RUE Abnormal movements: none Coordination:  There is mild decremation on the R Gait and Station: Patient pushes off of the chair to arise. Walks well in the hall. Min freezing with R leg in the turn.  Does well after the turn.  I have reviewed and interpreted the following labs independently   Chemistry      Component Value Date/Time   NA 140 02/15/2020 1733   K 3.7 02/15/2020 1733   CL 103 02/15/2020 1733   CO2 23  02/15/2020 1733   BUN 17 02/15/2020 1733   CREATININE 0.75 02/15/2020 1733      Component Value Date/Time   CALCIUM 9.4 02/15/2020 1733   ALKPHOS 75 04/12/2008 0524   AST 32 04/12/2008 0524   ALT 27 04/12/2008 0524   BILITOT 1.2 04/12/2008 0524       DBS programming was performed today which is described in more detail on a separate programming procedure note.    Total time spent on today's visit was 40 minutes, including both face-to-face time and nonface-to-face time.  Time included that spent on review of records (prior notes available to me/labs/imaging if pertinent), discussing treatment and goals, answering patient's questions and coordinating care.  This did not include DBS programming time, described in more detail on separate programming procedural note.  Cc:  Jefm Petty, MD

## 2020-09-16 ENCOUNTER — Other Ambulatory Visit: Payer: Self-pay

## 2020-09-16 ENCOUNTER — Encounter: Payer: Self-pay | Admitting: Neurology

## 2020-09-16 ENCOUNTER — Other Ambulatory Visit: Payer: Self-pay | Admitting: Neurology

## 2020-09-16 ENCOUNTER — Ambulatory Visit (INDEPENDENT_AMBULATORY_CARE_PROVIDER_SITE_OTHER): Payer: Medicare Other | Admitting: Neurology

## 2020-09-16 VITALS — BP 132/72 | HR 92 | Ht 74.0 in | Wt 227.0 lb

## 2020-09-16 DIAGNOSIS — M5441 Lumbago with sciatica, right side: Secondary | ICD-10-CM | POA: Diagnosis not present

## 2020-09-16 DIAGNOSIS — F330 Major depressive disorder, recurrent, mild: Secondary | ICD-10-CM | POA: Diagnosis not present

## 2020-09-16 DIAGNOSIS — G20 Parkinson's disease: Secondary | ICD-10-CM | POA: Diagnosis not present

## 2020-09-16 DIAGNOSIS — G8929 Other chronic pain: Secondary | ICD-10-CM

## 2020-09-16 MED ORDER — AMANTADINE HCL 100 MG PO CAPS: 100.0000 mg | ORAL_CAPSULE | Freq: Two times a day (BID) | ORAL | 1 refills | Status: DC

## 2020-09-16 MED ORDER — CLONAZEPAM 0.5 MG PO TABS: ORAL_TABLET | ORAL | 1 refills | Status: DC

## 2020-09-16 MED ORDER — CARBIDOPA-LEVODOPA 25-100 MG PO TABS: ORAL_TABLET | ORAL | 1 refills | Status: DC

## 2020-09-16 MED ORDER — ESCITALOPRAM OXALATE 20 MG PO TABS: 20.0000 mg | ORAL_TABLET | Freq: Every day | ORAL | 1 refills | Status: DC

## 2020-09-16 NOTE — Procedures (Signed)
DBS Programming was performed.    Manufacturer of DBS device: Medtronic  Total time spent programming was 7  minutes.  Device was confirmed to be on.  Soft start was confirmed to be on.  Impedences were checked and were within normal limits.  Battery was checked and was determined to be functioning normally and not near the end of life.  Final settings were as follows with group A active:   Active Contact Amplitude (V) PW (ms) Frequency (hz) Side Effects Battery  Left Brain        Group A        12/20/19 3-C+ 3.1 70 140  2.89  01/29/20 N/A       03/03/20 N/A       05/19/20 n/a       09/16/20 N/a                       Group B        12/20/19 2-C+ 2.0 70 130            Right Brain        Group A        12/20/19 3-C+ 3.2 90 135  2.94  01/29/20 3-C+ 3.2 (2.4-3.6) 90 135  2.94  03/03/20 3-C+ 3.2 90 135  2.94  05/19/20 3-C+ 3.2 (2.4-3.6) 90 135  2.92  09/16/20 3-C+ 3.2 90 135  2.90

## 2020-09-16 NOTE — Patient Instructions (Signed)
Increase lexapro to 20 mg daily You can take an extra carbidopa/levodopa 25/100 if needed.  Try it for the leg pain and if no helpful, then may need to follow up with Dr. Prince Rome or [facility]

## 2020-09-17 ENCOUNTER — Ambulatory Visit (INDEPENDENT_AMBULATORY_CARE_PROVIDER_SITE_OTHER): Payer: Medicare Other | Admitting: Psychologist

## 2020-09-17 DIAGNOSIS — F330 Major depressive disorder, recurrent, mild: Secondary | ICD-10-CM | POA: Diagnosis not present

## 2020-09-18 ENCOUNTER — Encounter: Payer: Medicare Other | Admitting: Neurology

## 2020-09-26 ENCOUNTER — Ambulatory Visit (INDEPENDENT_AMBULATORY_CARE_PROVIDER_SITE_OTHER): Payer: Medicare Other | Admitting: Psychologist

## 2020-09-26 DIAGNOSIS — F330 Major depressive disorder, recurrent, mild: Secondary | ICD-10-CM

## 2020-10-01 ENCOUNTER — Ambulatory Visit (INDEPENDENT_AMBULATORY_CARE_PROVIDER_SITE_OTHER): Payer: Medicare Other | Admitting: Psychologist

## 2020-10-01 DIAGNOSIS — F330 Major depressive disorder, recurrent, mild: Secondary | ICD-10-CM | POA: Diagnosis not present

## 2020-10-15 ENCOUNTER — Ambulatory Visit (INDEPENDENT_AMBULATORY_CARE_PROVIDER_SITE_OTHER): Payer: Medicare Other | Admitting: Psychologist

## 2020-10-15 DIAGNOSIS — F330 Major depressive disorder, recurrent, mild: Secondary | ICD-10-CM | POA: Diagnosis not present

## 2020-10-22 IMAGING — CT CT HEAD W/O CM
1 series · 16 of 30 positions shown, 20 images · non-contrast
Comparison: None.

CLINICAL DATA: Deep brain stimulator

EXAM:
CT HEAD WITHOUT CONTRAST
TECHNIQUE: Contiguous axial images were obtained from the base of the skull
through the vertex without intravenous contrast.

[Series 3: ax standard · axial · 0.49mm/px · z∈[+1176,+1348]mm · 16 of 186 slices shown, 20 images]
[im 7/186  brain]
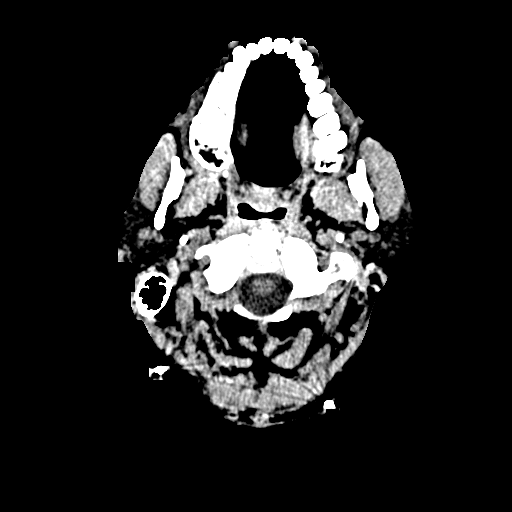
[im 7/186  bone]
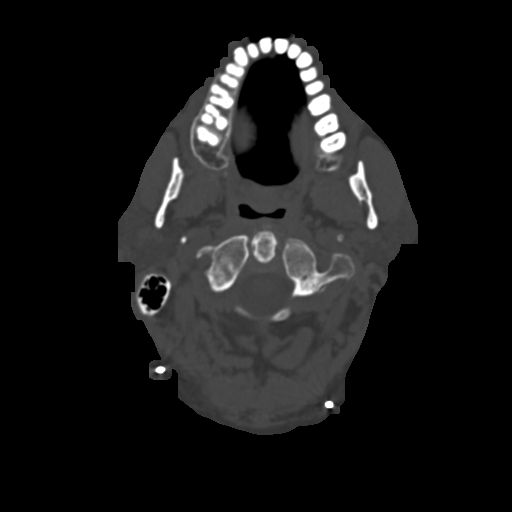
[im 20/186  brain]
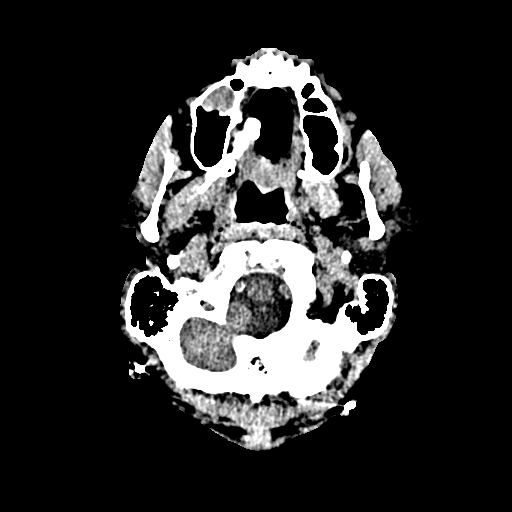
[im 32/186  brain]
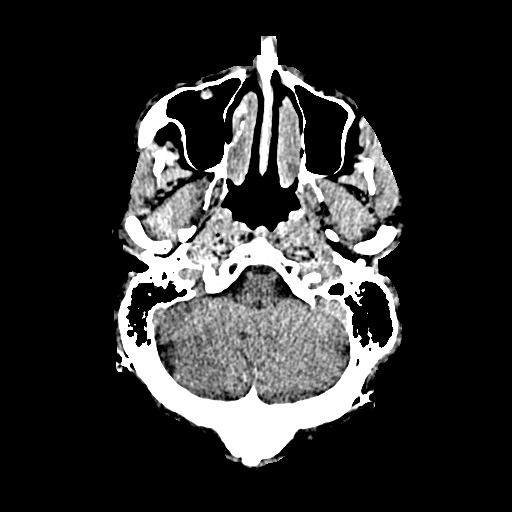
[im 45/186  brain]
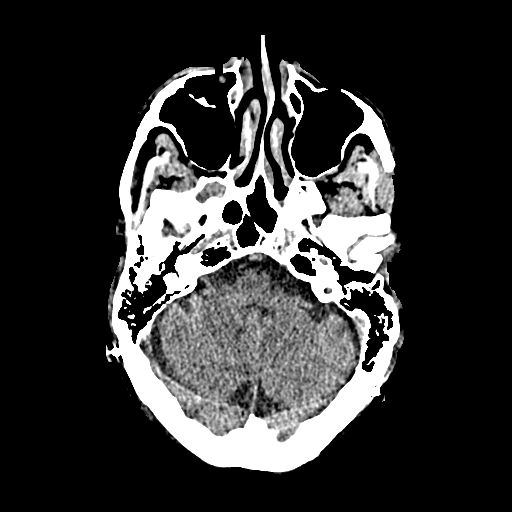
[im 52/186  brain]
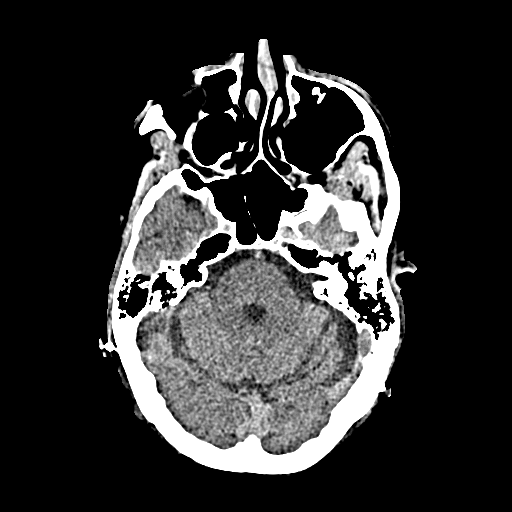
[im 52/186  bone]
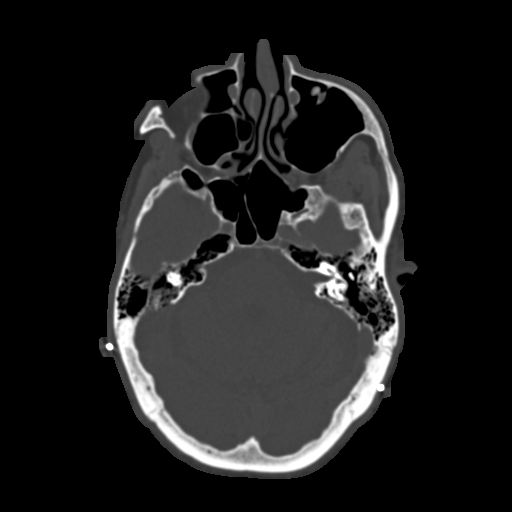
[im 64/186  brain]
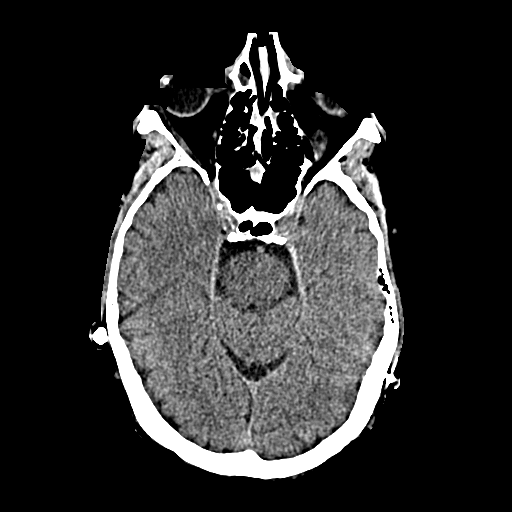
[im 77/186  brain]
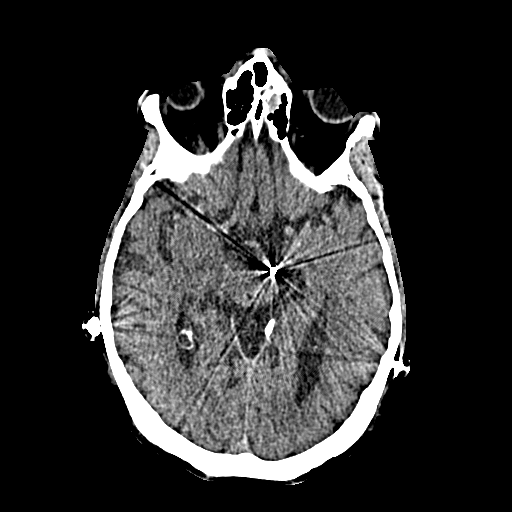
[im 90/186  brain]
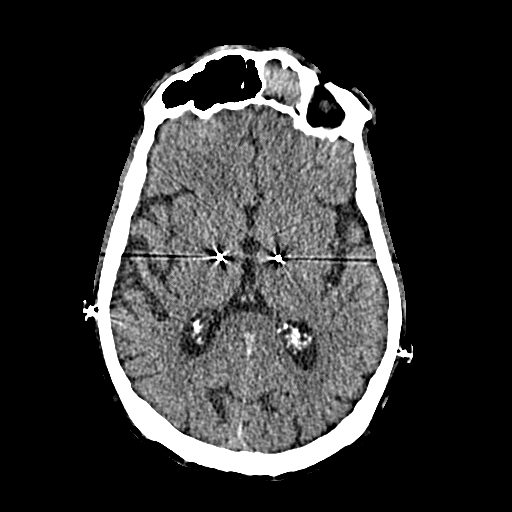
[im 96/186  brain]
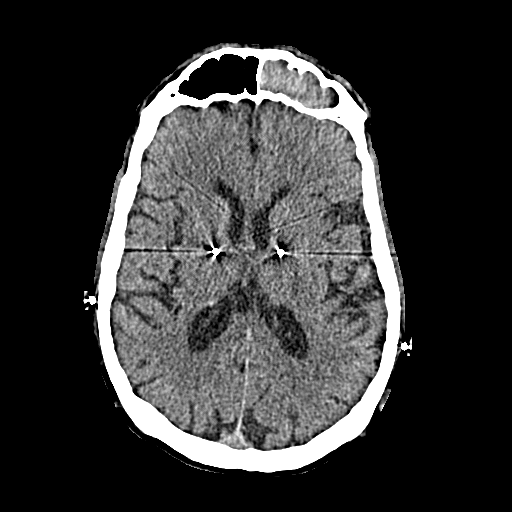
[im 96/186  bone]
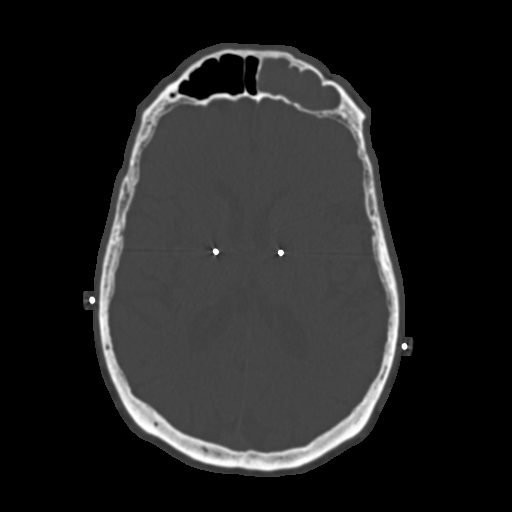
[im 109/186  brain]
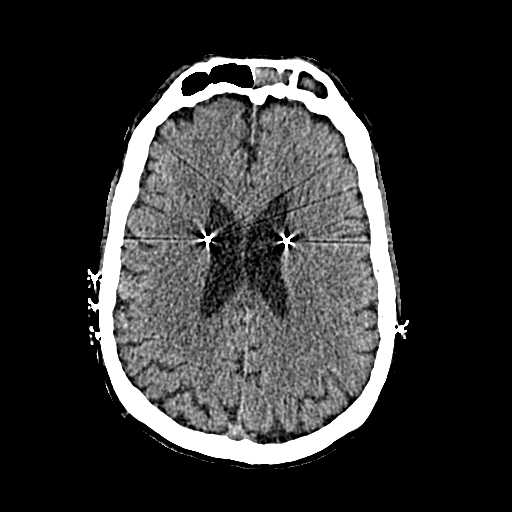
[im 122/186  brain]
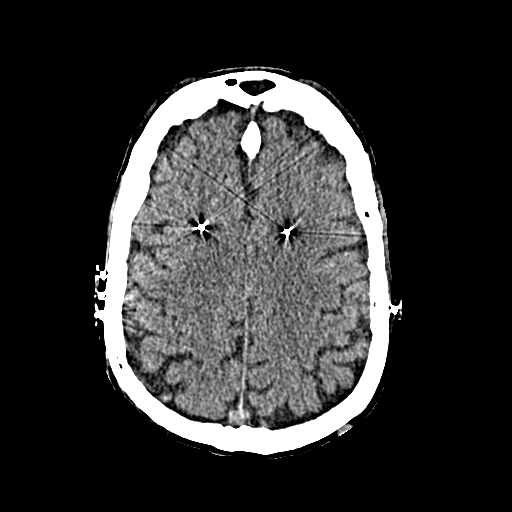
[im 134/186  brain]
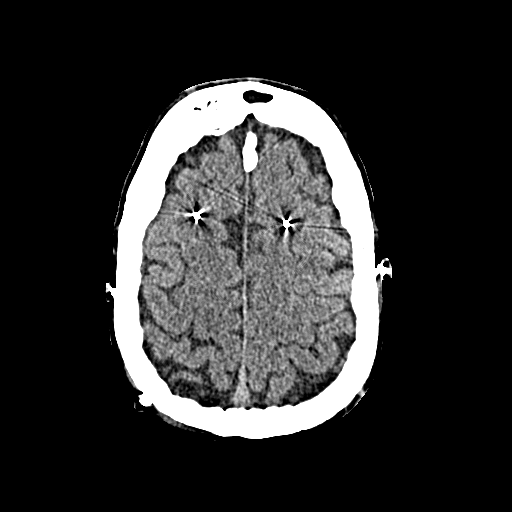
[im 141/186  brain]
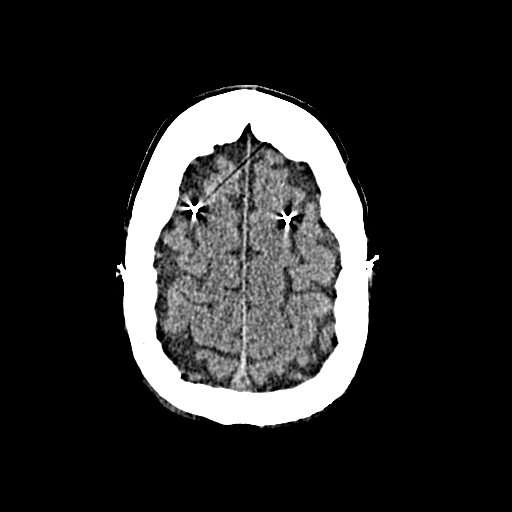
[im 141/186  bone]
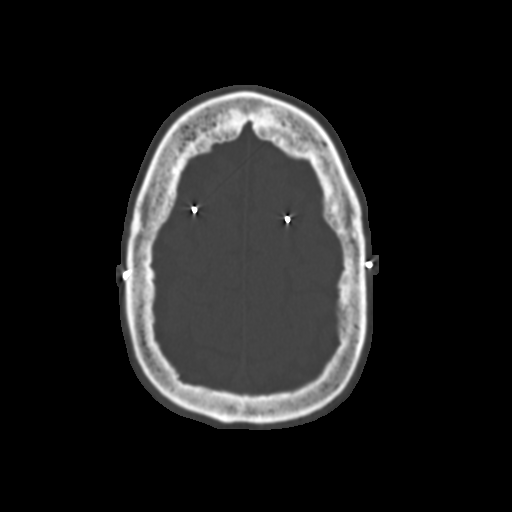
[im 154/186  brain]
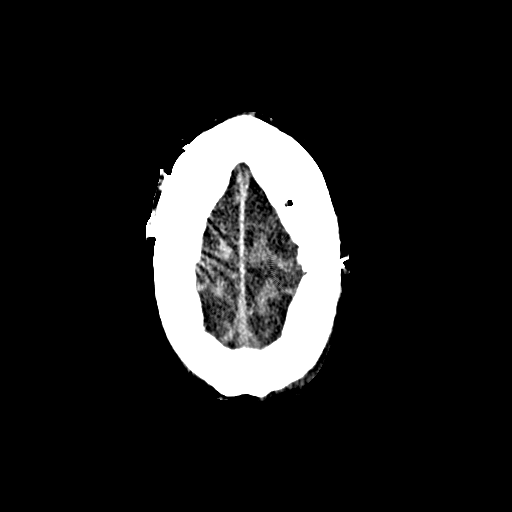
[im 166/186  brain]
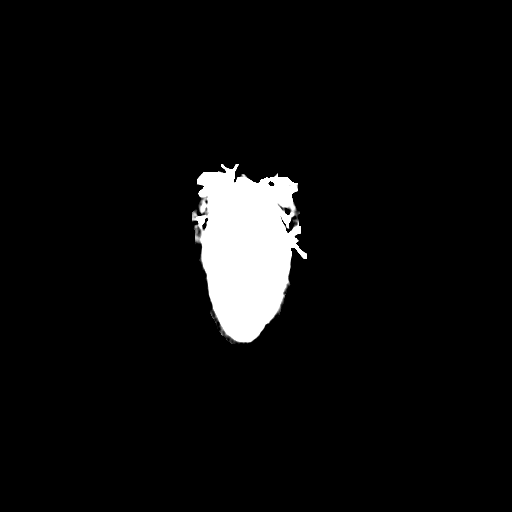
[im 179/186  brain]
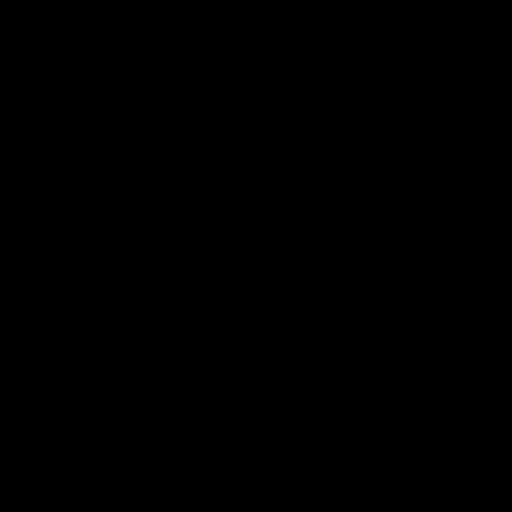

[16 of 30 positions shown; findings below may reference images not displayed]

FINDINGS: Brain: Bilateral stimulator leads extend through frontal burr hole
sites and terminate in the subthalamic region. The left lead
terminates slightly more inferiorly at the level of the midbrain.
Within limitation of associated streak artifact is no acute
hemorrhage mass effect, edema, or loss of gray-white
differentiation.

Vascular: Intracranial atherosclerotic calcification at the skull
base.

Skull: As above.  Otherwise unremarkable.

Sinuses/Orbits: Chronic left frontal sinusitis. Additional mild
patchy lobular paranasal sinus mucosal thickening. Orbits are
unremarkable.

Other: None.
IMPRESSION: Bilateral stimulator leads as described.  No acute abnormality.

## 2020-10-28 ENCOUNTER — Ambulatory Visit: Payer: Medicare Other | Admitting: Psychologist

## 2020-10-29 IMAGING — MR MR HEAD W/O CM
2 series · 48 of 48 positions shown · non-contrast
Comparison: MRI head 01/01/2019.  CT head 01/09/2019

CLINICAL DATA: Postop deep brain stimulator revision on the left.
Headache.

EXAM:
MRI HEAD WITHOUT CONTRAST
TECHNIQUE: Multiplanar, multiecho pulse sequences of the brain and surrounding
structures were obtained without intravenous contrast.

[Series 3: t1_mprage_tra_p2_iso no angle · axial · 1.0mm · 0.98mm/px · z∈[-83,+60]mm · 24 of 144 slices shown (1 of 2)]
[im 1/144]
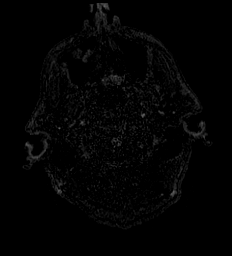
[im 7/144]
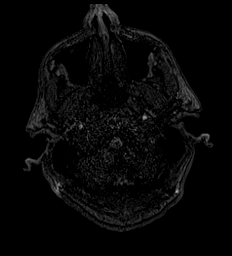
[im 13/144]
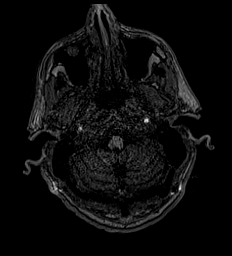
[im 19/144]
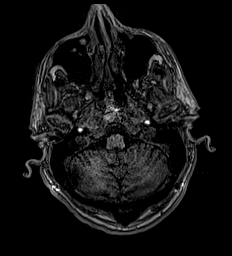
[im 25/144]
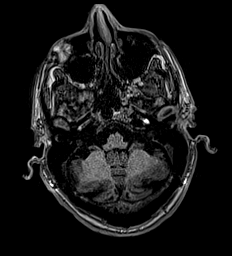
[im 32/144]
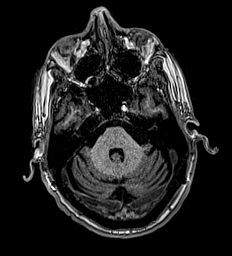
[im 38/144]
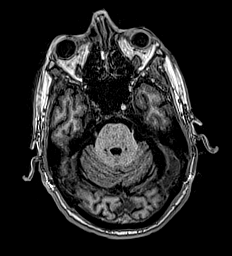
[im 44/144]
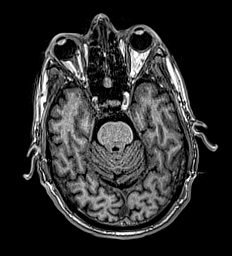
[im 50/144]
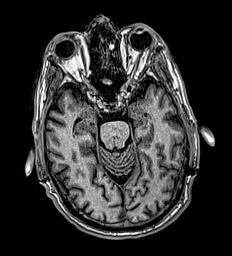
[im 56/144]
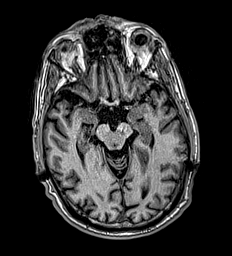
[im 63/144]
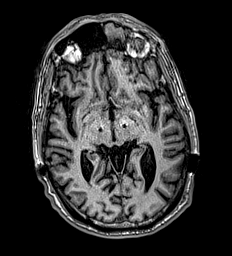
[im 69/144]
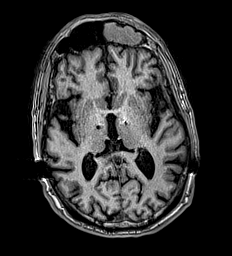
[im 75/144]
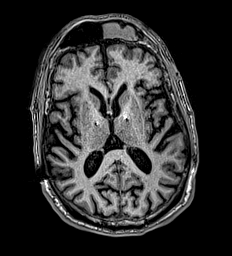
[im 81/144]
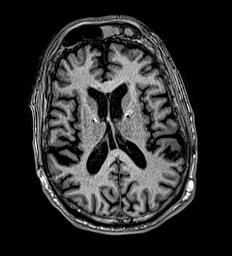
[im 88/144]
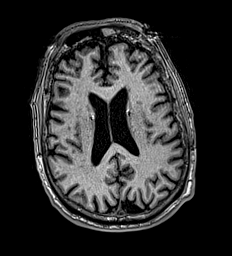
[im 94/144]
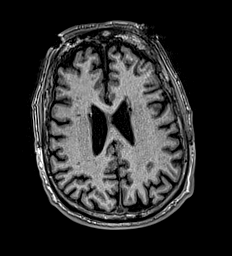
[im 100/144]
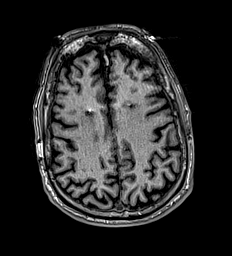
[im 106/144]
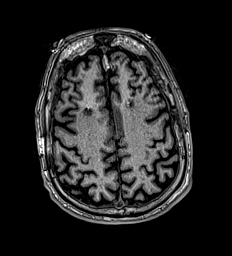
[im 112/144]
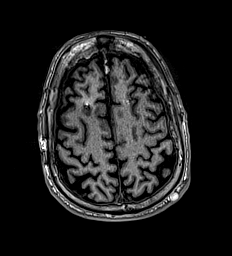
[im 119/144]
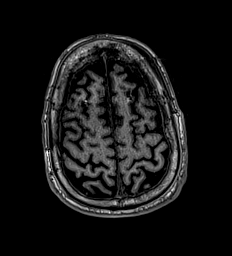
[im 125/144]
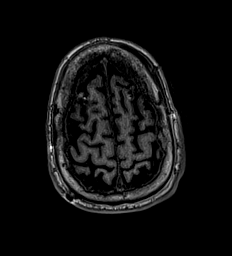
[im 131/144]
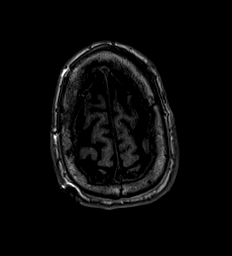
[im 137/144]
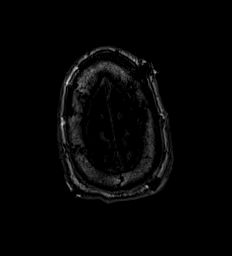
[im 144/144]
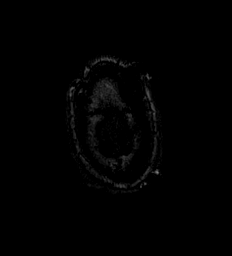

[Series 4: t1_mprage_tra_p2_iso no angle · axial · 1.0mm · 0.98mm/px · z∈[-92,+50]mm · 24 of 144 slices shown (2 of 2)]
[im 1/144]
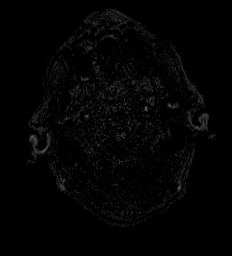
[im 7/144]
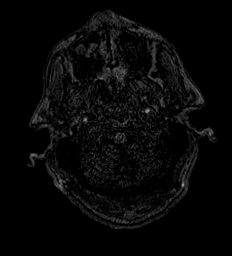
[im 13/144]
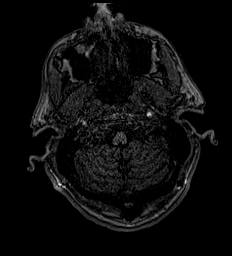
[im 19/144]
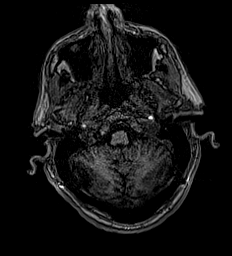
[im 25/144]
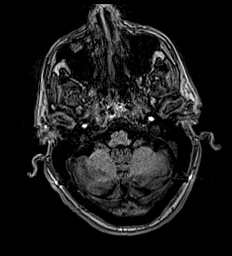
[im 32/144]
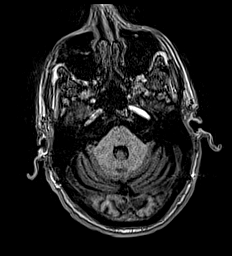
[im 38/144]
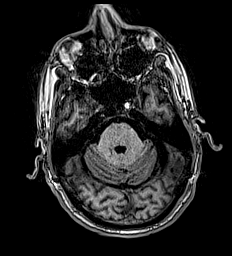
[im 44/144]
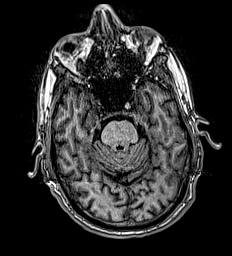
[im 50/144]
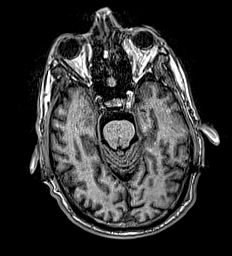
[im 56/144]
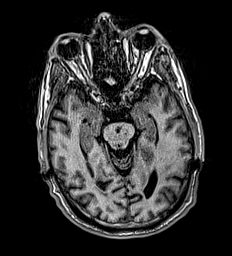
[im 63/144]
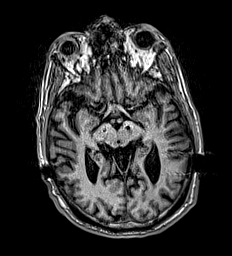
[im 69/144]
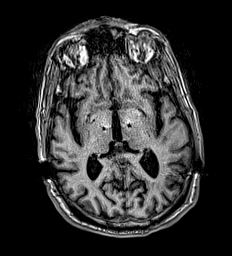
[im 75/144]
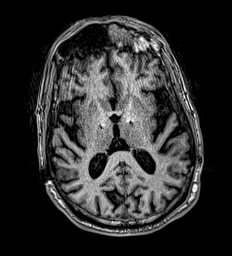
[im 81/144]
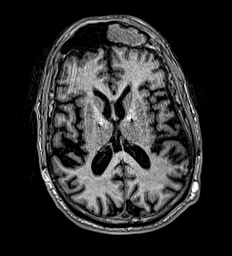
[im 88/144]
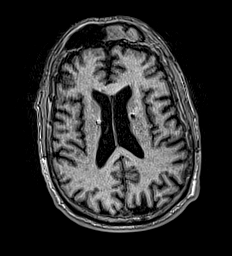
[im 94/144]
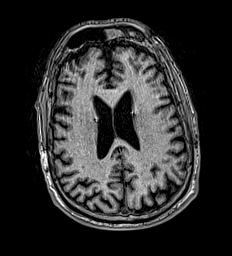
[im 100/144]
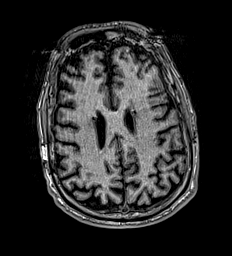
[im 106/144]
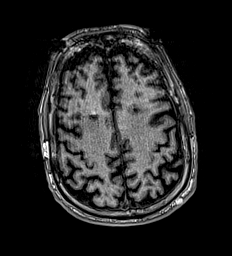
[im 112/144]
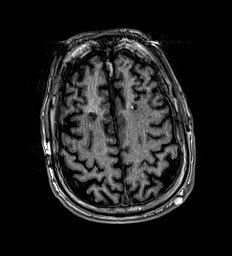
[im 119/144]
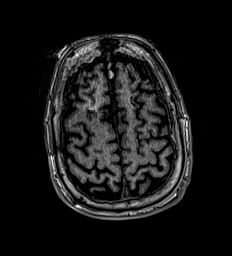
[im 125/144]
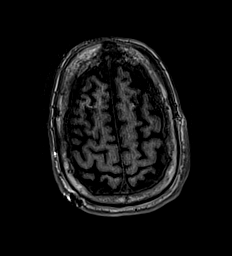
[im 131/144]
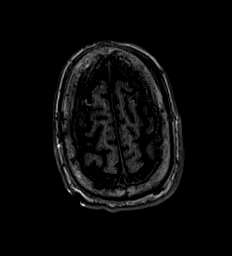
[im 137/144]
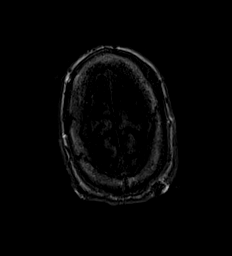
[im 144/144]
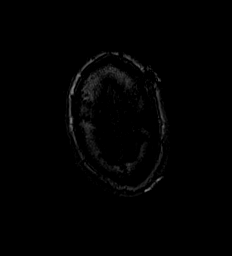

[48 of 48 positions shown; findings below may reference images not displayed]

FINDINGS: Limited sequences were obtained of the brain post revision of the
left deep brain stimulator which was withdrawn 7.5 mm. Both leads
are now at approximately the same level in the inferior thalamus.

No hemorrhage or fluid collection identified. Ventricle size normal.
No midline shift.

Normal vascular flow voids.

Mucosal thickening and retained secretions throughout the left
frontal sinus unchanged from prior study. Negative orbit
IMPRESSION: Left deep brain stimulator lead withdrawn 7.5 mm. No immediate
complication identified.

## 2020-10-30 ENCOUNTER — Ambulatory Visit (INDEPENDENT_AMBULATORY_CARE_PROVIDER_SITE_OTHER): Payer: Medicare Other | Admitting: Psychologist

## 2020-10-30 ENCOUNTER — Other Ambulatory Visit: Payer: Self-pay

## 2020-10-30 DIAGNOSIS — F330 Major depressive disorder, recurrent, mild: Secondary | ICD-10-CM | POA: Diagnosis not present

## 2020-11-20 ENCOUNTER — Ambulatory Visit (INDEPENDENT_AMBULATORY_CARE_PROVIDER_SITE_OTHER): Payer: Medicare Other | Admitting: Psychologist

## 2020-11-20 DIAGNOSIS — F330 Major depressive disorder, recurrent, mild: Secondary | ICD-10-CM | POA: Diagnosis not present

## 2020-11-21 ENCOUNTER — Other Ambulatory Visit: Payer: Self-pay

## 2020-11-21 DIAGNOSIS — G20 Parkinson's disease: Secondary | ICD-10-CM

## 2020-12-04 ENCOUNTER — Other Ambulatory Visit: Payer: Self-pay

## 2020-12-04 ENCOUNTER — Ambulatory Visit (INDEPENDENT_AMBULATORY_CARE_PROVIDER_SITE_OTHER): Payer: Medicare Other | Admitting: Psychologist

## 2020-12-04 DIAGNOSIS — F330 Major depressive disorder, recurrent, mild: Secondary | ICD-10-CM

## 2020-12-18 ENCOUNTER — Ambulatory Visit (INDEPENDENT_AMBULATORY_CARE_PROVIDER_SITE_OTHER): Payer: Medicare Other | Admitting: Psychologist

## 2020-12-18 ENCOUNTER — Other Ambulatory Visit: Payer: Self-pay

## 2020-12-18 DIAGNOSIS — F330 Major depressive disorder, recurrent, mild: Secondary | ICD-10-CM | POA: Diagnosis not present

## 2021-01-01 ENCOUNTER — Ambulatory Visit (INDEPENDENT_AMBULATORY_CARE_PROVIDER_SITE_OTHER): Payer: Medicare Other | Admitting: Psychologist

## 2021-01-01 DIAGNOSIS — F330 Major depressive disorder, recurrent, mild: Secondary | ICD-10-CM | POA: Diagnosis not present

## 2021-01-09 NOTE — Progress Notes (Signed)
Assessment/Plan:   1.  Parkinsons Disease             -The patient underwent right STN DBS on December 10, 2011 and a left STN DBS on December 27, 2012.  Postoperatively, his left STN DBS lead was found to be displaced and had revision of the left lead on February 02, 2013 (pulled back 1 cm without MER).   IPG on the RIGHT changed on 01/23/19.  IPG on the left last changed on 02/02/18             -Patient underwent lead revision on January 16, 2019 on the left, as the lead was still displaced/deep.  It was pulled back 7.5 mm.  Postoperative imaging indicated that it was still 5 mm too deep, so when the battery was changed to the following week, it was pulled back another 5 mm.  Postoperative imaging indicates that the leads were in good place, but the patient hit his head on the corner of a wall and developed an abrasion over the lead, which ultimately exposed to lead.  This led to a wound infection, requiring that the entire system on the left be removed on January 22, 2020.    -going to [employer] for biking 2 days per week  -Continue carbidopa/levodopa 25/100, 2 at 7am, 2 at 9:30am, 2 at noon, 2 at 2:30 pm, 2 at 5pm.  Discussed with him that he really needs to take an extra half tablet to 1 tablet as needed for off episodes or for dose failure.  -Continue amantadine, 100 mg twice per day  -Continue Inbrija as needed, up to 5 times per day, separated by 2-hour intervals.  Told him that he may need to schedule this for first morning on and perhaps 2 other times during the day.  -referral to [place] for speech therapy  -Wife asks me about palliative care.  Not sure that he needs this right now, but will go ahead and send referral and see if it offers him some services.     2.  RBD             -Continue clonazepam, 0.5 mg, 1.5 tablets at night.     3.  Urinary incontinence             -referred to urology to Dr. Felipa Eth.  We made appt while in the office.  Patient was a patient of Dr.  Carlyle Lipa at his previous office.   4.  Low back pain             -Follows with Dr. Letta Pate as well as [facility] neurosurgery.  He is having more difficulties with his back since his DBS was removed (not able to move nearly as well).  I think that the right leg pain may be from this as opposed to Parkinson's disease.  He was told to try an extra levodopa when he has it, and if it does not work, he needs to get back to see Dr. Letta Pate.  He previously saw Dr. Prince Rome as well at [facility], who has since retired.  5.  Depression  -Continue Lexapro, 20 mg daily  -Long discussion about this aspect today.  Wife feels that he is more depressed.  Patient is not convinced.  Discussed difference between depression and apathy.  Discussed psychiatry, but neither think that he really needs to go there.  He is already in counseling, but patient admits that he really is not participating.  After discussing several options, he ultimately stated that he really just needs to participate more with a counselor, primarily for the sake of his wife.  Following my discussion with the patient, he also talked with my LCSW.  6.  Sleep apnea  -Following with neurology in [facility] (sees the PA).  On CPAP.   Subjective:   Patrick Ross was seen today.  Patient accompanied by wife who supplements history.  One fall since last visit - cannot even remember what happened.  Exercising with [employer].  Going to [place].   No lightheadedness or near syncope.  He has had a nocturnal polysomnogram since our last visit.  AHI 12.4.  wearing cpap faithfully.  States has hallucinations but really describes vivid dreaming and not true hallucinations.  Doing speech study at [facility] via zoom.  Mentions several UTI's.  Trying to get to the [place] to urology.  Current prescribed movement disorder medications: carbidopa/levodopa 25/100, 2 at 7am, 2 at 9:30am, 2 at noon, 2 at 2:30 pm, 2 at 5pm and an extra as needed  Clonazepam 0.5 mg, 1-1/2  tablets at bedtime Amantadine, 100 mg twice per day  Inbrija (using qd-bid) Lexapro, 20 mg daily (increased last visit)   ALLERGIES:   Allergies  Allergen Reactions   Other Other (See Comments)    Surgical spray prior to adhesive causes blisters   Sulfamethoxazole Other (See Comments)    Unsure--childhood allergy    CURRENT MEDICATIONS:  Outpatient Encounter Medications as of 01/12/2021  Medication Sig   amantadine (SYMMETREL) 100 MG capsule Take 1 capsule (100 mg total) by mouth 2 (two) times daily.   AMBULATORY NON FORMULARY MEDICATION Lift chair Dx:  G20 (Patient taking differently: Lift chair Dx:  G20)   carbidopa-levodopa (SINEMET IR) 25-100 MG tablet 2 at 7am, 2 at 9:30am, 2 at noon, 2 at 2:30 pm, 2 at 5pm.  If you need an extra late evening, you may take 1 extra   clonazePAM (KLONOPIN) 0.5 MG tablet 1.5 tabs at bed   escitalopram (LEXAPRO) 20 MG tablet Take 1 tablet (20 mg total) by mouth daily.   famotidine (PEPCID) 20 MG tablet Take 20 mg by mouth 2 (two) times daily.   Levodopa (INBRIJA) 42 MG CAPS Place 2 capsules into inhaler and inhale 5 (five) times daily as needed (up to 5 times a day as needed).   levothyroxine (SYNTHROID) 125 MCG tablet Take 125 mcg by mouth daily.   lisinopril (PRINIVIL,ZESTRIL) 20 MG tablet Take 20 mg by mouth daily.   mirabegron ER (MYRBETRIQ) 25 MG TB24 tablet Take 25 mg by mouth at bedtime.   OVER THE COUNTER MEDICATION Take 2 tablets by mouth 2 (two) times daily. Alpha CRS otc supplement   OVER THE COUNTER MEDICATION Take 2 capsules by mouth 2 (two) times daily. Microplex VMz otc supplement   OVER THE COUNTER MEDICATION Take 2 capsules by mouth 2 (two) times daily. XEOmega 3 oil complex   OVER THE COUNTER MEDICATION Take 1 tablet by mouth daily. onguard+ otc supplement   OVER THE COUNTER MEDICATION Take 3 capsules by mouth at bedtime. terrazyme supplement   OVER THE COUNTER MEDICATION Take 1 tablet by mouth 2 (two) times daily. Bone nutrient  otc supplement   OVER THE COUNTER MEDICATION Take 1 capsule by mouth at bedtime. PB Assist pro/prebiotic   rosuvastatin (CRESTOR) 20 MG tablet Take 20 mg by mouth daily.   No facility-administered encounter medications on file as of 01/12/2021.    Objective:  PHYSICAL EXAMINATION:    VITALS:   Vitals:   01/12/21 1430  BP: (!) 142/82  Pulse: 61  SpO2: 97%  Weight: 227 lb (103 kg)  Height: 6\' 2"  (1.88 m)      GEN:  The patient appears stated age and is in NAD. HEENT:  Normocephalic.  Scalp incision is well-healed.  The mucous membranes are moist. The superficial temporal arteries are without ropiness or tenderness. CV:  RRR Lungs:  CTAB Neck/HEME:  There are no carotid bruits bilaterally.  Neurological examination:  Orientation: The patient is alert and oriented x3. Cranial nerves: There is good facial symmetry with facial hypomimia. The speech is fluent and clear.  He is much less dysarthric than previous. Soft palate rises symmetrically and there is no tongue deviation. Hearing is intact to conversational tone. Sensation: Sensation is intact to light touch throughout Motor: Strength is at least antigravity x4.  Movement examination: Tone: There is mild increased tone in the RUE Abnormal movements: none Coordination:  There is mild decremation on the R Gait and Station: Patient pushes off of the chair to arise. Walks well in the hall. Min freezing with R leg in the turn.  Does well after the turn.  I have reviewed and interpreted the following labs independently   Chemistry      Component Value Date/Time   NA 140 02/15/2020 1733   K 3.7 02/15/2020 1733   CL 103 02/15/2020 1733   CO2 23 02/15/2020 1733   BUN 17 02/15/2020 1733   CREATININE 0.75 02/15/2020 1733      Component Value Date/Time   CALCIUM 9.4 02/15/2020 1733   ALKPHOS 75 04/12/2008 0524   AST 32 04/12/2008 0524   ALT 27 04/12/2008 0524   BILITOT 1.2 04/12/2008 0524       DBS programming was  performed today which is described in more detail on a separate programming procedure note.    Total time spent on today's visit was 50 minutes, including both face-to-face time and nonface-to-face time.  Time included that spent on review of records (prior notes available to me/labs/imaging if pertinent), discussing treatment and goals, answering patient's questions and coordinating care.  This did not include DBS programming time, described in more detail on separate programming procedural note.  Cc:  Jefm Petty, MD

## 2021-01-12 ENCOUNTER — Encounter: Payer: Self-pay | Admitting: Neurology

## 2021-01-12 ENCOUNTER — Ambulatory Visit (INDEPENDENT_AMBULATORY_CARE_PROVIDER_SITE_OTHER): Payer: Medicare Other | Admitting: Neurology

## 2021-01-12 ENCOUNTER — Other Ambulatory Visit: Payer: Self-pay

## 2021-01-12 VITALS — BP 142/82 | HR 61 | Ht 74.0 in | Wt 227.0 lb

## 2021-01-12 DIAGNOSIS — G20 Parkinson's disease: Secondary | ICD-10-CM

## 2021-01-12 DIAGNOSIS — F330 Major depressive disorder, recurrent, mild: Secondary | ICD-10-CM

## 2021-01-12 DIAGNOSIS — R32 Unspecified urinary incontinence: Secondary | ICD-10-CM

## 2021-01-12 NOTE — Procedures (Signed)
DBS Programming was performed.    Manufacturer of DBS device: Medtronic  Total time spent programming was 8  minutes.  Device was confirmed to be on.  Soft start was confirmed to be on.  Impedences were checked and were within normal limits.  Battery was checked and was determined to be functioning normally and not near the end of life.  Final settings were as follows with group A active:   Active Contact Amplitude (V) PW (ms) Frequency (hz) Side Effects Battery  Left Brain        Group A        12/20/19 3-C+ 3.1 70 140  2.89  01/29/20 N/A       03/03/20 N/A       05/19/20 n/a       09/16/20 N/a                       Group B        12/20/19 2-C+ 2.0 70 130            Right Brain        Group A        12/20/19 3-C+ 3.2 90 135  2.94  01/29/20 3-C+ 3.2 (2.4-3.6) 90 135  2.94  03/03/20 3-C+ 3.2 90 135  2.94  05/19/20 3-C+ 3.2 (2.4-3.6) 90 135  2.92  09/16/20 3-C+ 3.2 90 135  2.90  01/12/21 3-C+ 3.25 90 135  2.88

## 2021-01-13 ENCOUNTER — Telehealth: Payer: Self-pay

## 2021-01-13 NOTE — Telephone Encounter (Signed)
Spoke with patient's wife Jackelyn Poling and scheduled an in-person Palliative Consult for 01/21/21 @ 8:30 AM with Dr. Hollace Kinnier. Documentation will be noted in [place].   COVID screening was negative. One dog in the home will put away. Patient lives with wife, daughter, and grandchildren.    Consent obtained; updated Outlook/Netsmart/Team List and Epic.   Family is aware they may be receiving a call from provider the day before or day of to confirm appointment.

## 2021-01-15 ENCOUNTER — Other Ambulatory Visit: Payer: Self-pay

## 2021-01-15 ENCOUNTER — Encounter: Payer: Self-pay | Admitting: Urology

## 2021-01-15 ENCOUNTER — Ambulatory Visit (INDEPENDENT_AMBULATORY_CARE_PROVIDER_SITE_OTHER): Payer: Medicare Other | Admitting: Urology

## 2021-01-15 VITALS — BP 160/91 | HR 89 | Temp 98.1°F

## 2021-01-15 DIAGNOSIS — D1771 Benign lipomatous neoplasm of kidney: Secondary | ICD-10-CM | POA: Diagnosis not present

## 2021-01-15 DIAGNOSIS — N3941 Urge incontinence: Secondary | ICD-10-CM | POA: Insufficient documentation

## 2021-01-15 DIAGNOSIS — Z87442 Personal history of urinary calculi: Secondary | ICD-10-CM | POA: Insufficient documentation

## 2021-01-15 DIAGNOSIS — R8290 Unspecified abnormal findings in urine: Secondary | ICD-10-CM | POA: Diagnosis not present

## 2021-01-15 DIAGNOSIS — Z87440 Personal history of urinary (tract) infections: Secondary | ICD-10-CM | POA: Diagnosis not present

## 2021-01-15 NOTE — Progress Notes (Signed)
Assessment: 1. Urge incontinence   2. History of UTI   3. Abnormal urine findings   4. Renal angiomyolipoma   5. History of nephrolithiasis     Plan: I reviewed his records from his prior visit with me in 10/21.  I also reviewed recent lab results regarding his UTIs. I have recommended further evaluation of his recent UTIs with imaging and cystoscopy. Urine culture sent today. He has a history of worsening urge incontinence, likely related to his severe Parkinson's disease. Increase Myrbetriq to 50 mg daily.  Samples given. Schedule for CT renal stone study Return for cystoscopy in 3-4 weeks    Chief Complaint: Chief Complaint  Patient presents with   Urinary Incontinence     HPI: Patrick Ross is a 75 y.o. 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  He has a history of urinary incontinence for a number of years.  He was last seen in [facility] in October 2021.  At that time he was having symptoms of frequency, urgency, nocturia x3 and urge incontinence.  He had previously been on Myrbetriq but had discontinued this as his insurance would not cover the medication.  At that time he had worsening of his urinary symptoms.  He was having nocturnal incontinence and was using incontinence diapers on a regular basis.  PVR = 11 mL.  He was given prescriptions for Myrbetriq and Vesicare.  Unfortunately his insurance would not cover either of these medications.  He was started on trospium 20 mg twice daily.  He was ultimately able to resume Myrbetriq 25 mg daily through the [place].  He is currently taking Myrbetriq 25 mg daily.  He continues to have frequency, urgency, nocturia x5, daytime urge incontinence, and nocturnal incontinence. IPSS = 13 today. He has recently had 2 UTIs.  A urine culture in August 2022 grew 50-100 K Enterococcus.  A urine culture from  11/22 grew >100 K E. coli.  He recently completed a course of Macrobid.  Typical symptoms associate with UTI include dysuria.  No gross hematuria.  He is not having any UTI symptoms today.  PSA from 9/22: 2.40  He has a history of a right renal angiomyolipoma and is status post a right partial nephrectomy in 4/20.  Follow-up imaging with a renal ultrasound in 7/20 showed bilateral simple renal cyst. He does have a prior history of nephrolithiasis.  No recent stone symptoms.   Portions of the above documentation were copied from a prior visit for review purposes only.  Allergies: Allergies  Allergen Reactions   Other Other (See Comments)    Surgical spray prior to adhesive causes blisters   Sulfamethoxazole Other (See Comments)    Unsure--childhood allergy    PMH: Past Medical History:  Diagnosis Date   Depression    DVT (deep venous thrombosis) (HCC)    GERD (gastroesophageal reflux disease)    History of kidney stones    passed   Hypertension    Hypothyroidism    Lumbar stenosis    OSA (obstructive sleep apnea)    wears cpap   Parkinson's disease (HCC)    PONV (postoperative nausea and vomiting)    REM behavioral disorder    Urinary incontinence     PSH: Past Surgical History:  Procedure Laterality Date   BACK SURGERY     x 2 - not fusion   COLONOSCOPY  DEEP BRAIN STIMULATOR PLACEMENT     left 12/27/2012, right 12/10/2011, left revision 02/02/2013, replacement bilateral 11/21/2015   MINOR PLACEMENT OF FIDUCIAL N/A 01/09/2019   Procedure: Fiducial placement;  Surgeon: Erline Levine, MD;  Location: [place];  Service: Neurosurgery;  Laterality: N/A;  Fiducial placement   PARTIAL NEPHRECTOMY Right 2005 ish   PULSE GENERATOR IMPLANT Right 01/23/2019   Procedure: Right chest implantable pulse generator change;  Surgeon: Erline Levine, MD;  Location: [place];  Service: Neurosurgery;  Laterality: Right;   SPINAL FUSION     lumbar Fusion x 2   SUBTHALAMIC STIMULATOR BATTERY  REPLACEMENT Left 02/02/2018   Procedure: Change implantable pulse generator battery, Left chest;  Surgeon: Erline Levine, MD;  Location: [place];  Service: Neurosurgery;  Laterality: Left;   SUBTHALAMIC STIMULATOR BATTERY REPLACEMENT N/A 01/22/2020   Procedure: REMOVAL OF LEFT BRAIN DEEP BRAIN STIMULATOR ELECTRODE, EXTENSION, AND PULSE GENERATOR;  Surgeon: Erline Levine, MD;  Location: [place];  Service: Neurosurgery;  Laterality: N/A;   SUBTHALAMIC STIMULATOR INSERTION Left 01/16/2019   Procedure: Left deep brain stimulator electrode revision.;  Surgeon: Erline Levine, MD;  Location: [place];  Service: Neurosurgery;  Laterality: Left;   SUBTHALAMIC STIMULATOR INSERTION Left 01/23/2019   Procedure: Repositioning of Left Deep Brain Stimulator Electrode;  Surgeon: Erline Levine, MD;  Location: [place];  Service: Neurosurgery;  Laterality: Left;   TONSILLECTOMY      SH: Social History   Tobacco Use   Smoking status: Never   Smokeless tobacco: Never  Vaping Use   Vaping Use: Never used  Substance Use Topics   Alcohol use: Not Currently   Drug use: Never    ROS: Constitutional:  Negative for fever, chills, weight loss CV: Negative for chest pain, previous MI, hypertension Respiratory:  Negative for shortness of breath, wheezing, sleep apnea, frequent cough GI:  Negative for nausea, vomiting, bloody stool, GERD  PE: BP (!) 160/91   Pulse 89   Temp 98.1 F (36.7 C)  GENERAL APPEARANCE:  Well appearing, well developed, well nourished, NAD HEENT:  Atraumatic, normocephalic, oropharynx clear NECK:  Supple without lymphadenopathy or thyromegaly ABDOMEN:  Soft, non-tender, no masses EXTREMITIES:  Moves all extremities well, without clubbing, cyanosis, or edema NEUROLOGIC:  Alert and oriented x 3, uses walker, CN II-XII grossly intact MENTAL STATUS:  appropriate BACK:  Non-tender to palpation, No CVAT SKIN:  Warm, dry, and intact   Results: U/A:  11-30 WBC, mod bacteria, nitrite  negative  PVR:  84 ml

## 2021-01-15 NOTE — Progress Notes (Signed)
post void residual = 69ml

## 2021-01-15 NOTE — Progress Notes (Signed)
Urological Symptom Review  Patient is experiencing the following symptoms: Frequent urination Leakage of urine Stream starts and stops Urinary tract infection Erection problems (male only)   Review of Systems  Gastrointestinal (upper)  : Negative for upper GI symptoms  Gastrointestinal (lower) : Negative for lower GI symptoms  Constitutional : Negative for symptoms  Skin: Negative for skin symptoms  Eyes: Negative for eye symptoms  Ear/Nose/Throat : Negative for Ear/Nose/Throat symptoms  Hematologic/Lymphatic: Negative for Hematologic/Lymphatic symptoms  Cardiovascular : Negative for cardiovascular symptoms  Respiratory : Negative for respiratory symptoms  Endocrine: Negative for endocrine symptoms  Musculoskeletal: Back pain  Neurological: Negative for neurological symptoms  Psychologic: Negative for psychiatric symptoms

## 2021-01-16 LAB — URINALYSIS, ROUTINE W REFLEX MICROSCOPIC
Bilirubin, UA: NEGATIVE
Glucose, UA: NEGATIVE
Ketones, UA: NEGATIVE
Nitrite, UA: NEGATIVE
Protein,UA: NEGATIVE
RBC, UA: NEGATIVE
Specific Gravity, UA: 1.015 (ref 1.005–1.030)
Urobilinogen, Ur: 0.2 mg/dL (ref 0.2–1.0)
pH, UA: 7 (ref 5.0–7.5)

## 2021-01-16 LAB — MICROSCOPIC EXAMINATION
Epithelial Cells (non renal): NONE SEEN /hpf (ref 0–10)
RBC, Urine: NONE SEEN /hpf (ref 0–2)
Renal Epithel, UA: NONE SEEN /hpf

## 2021-01-19 ENCOUNTER — Other Ambulatory Visit: Payer: Self-pay

## 2021-01-19 ENCOUNTER — Ambulatory Visit (HOSPITAL_BASED_OUTPATIENT_CLINIC_OR_DEPARTMENT_OTHER)
Admission: RE | Admit: 2021-01-19 | Discharge: 2021-01-19 | Disposition: A | Discharge status: Home / Self Care | Payer: Medicare Other | Source: Ambulatory Visit | Attending: Urology | Admitting: Urology

## 2021-01-19 DIAGNOSIS — Z87440 Personal history of urinary (tract) infections: Secondary | ICD-10-CM | POA: Diagnosis present

## 2021-01-19 DIAGNOSIS — D1771 Benign lipomatous neoplasm of kidney: Secondary | ICD-10-CM | POA: Diagnosis present

## 2021-01-19 DIAGNOSIS — Z87442 Personal history of urinary calculi: Secondary | ICD-10-CM | POA: Insufficient documentation

## 2021-01-19 LAB — URINE CULTURE

## 2021-01-20 ENCOUNTER — Telehealth: Payer: Self-pay

## 2021-01-20 MED ORDER — CEFDINIR 300 MG PO CAPS
300.0000 mg | ORAL_CAPSULE | Freq: Two times a day (BID) | ORAL | 0 refills | Status: AC
Start: 2021-01-20 — End: 2021-01-27

## 2021-01-20 MED ORDER — NITROFURANTOIN MONOHYD MACRO 100 MG PO CAPS: 100.0000 mg | ORAL_CAPSULE | Freq: Every day | ORAL | 1 refills | Status: DC

## 2021-01-20 NOTE — Addendum Note (Signed)
Addended by: Primus Bravo on: 01/20/2021 11:12 AM   Modules accepted: Orders

## 2021-01-20 NOTE — Telephone Encounter (Signed)
Patient was called and notified of a UTI, he was notified his prescription was called into his pharmacy and instructions were given on how to take medication. Patient confirmed understanding.

## 2021-01-20 NOTE — Telephone Encounter (Signed)
-----   Message from Primus Bravo, MD sent at 01/20/2021 11:12 AM EST ----- Please notify patient that his urine culture does show evidence of a UTI.  Recommend treatment with cefdinir twice a day for 7 days.  I would then like for him to begin daily Macrodantin for UTI prevention.  Prescriptions for both antibiotics sent to his pharmacy.

## 2021-02-03 ENCOUNTER — Telehealth: Payer: Self-pay

## 2021-02-03 NOTE — Telephone Encounter (Signed)
Vm left for patient's wife to follow up for request for education on Condom catheter.

## 2021-02-05 ENCOUNTER — Telehealth: Payer: Self-pay

## 2021-02-05 ENCOUNTER — Other Ambulatory Visit: Payer: Self-pay

## 2021-02-05 ENCOUNTER — Ambulatory Visit (INDEPENDENT_AMBULATORY_CARE_PROVIDER_SITE_OTHER): Payer: Medicare Other | Admitting: Psychologist

## 2021-02-05 DIAGNOSIS — F330 Major depressive disorder, recurrent, mild: Secondary | ICD-10-CM | POA: Diagnosis not present

## 2021-02-05 NOTE — Telephone Encounter (Signed)
Spoke with patient's wife regarding education on condom catheter. Plan is to email out educational references and f/u after patient's wife reviews if she has any further questions.

## 2021-02-05 NOTE — Progress Notes (Signed)
[place] Counselor/Therapist Progress Note  Patient ID: ZI [place], MRN: [id number],    Date: 02/05/2021  Time Spent: 1:07 pm to 1:29 pm; Total Time: 22 minutes   This session was held via in person. The patient consented to in-person therapy and was in the clinician's office. Limits of confidentiality were discussed with the patient.   Treatment Type: Individual Therapy  Reported Symptoms: Patient stated that he is experiencing less depressive symptoms.   Mental Status Exam: Appearance:  Well Groomed     Behavior: Appropriate  Motor: Normal  Speech/Language:  Normal Rate  Affect: Appropriate  Mood: normal  Thought process: normal  Thought content:   WNL  Sensory/Perceptual disturbances:   WNL  Orientation: oriented to person and place  Attention: Fair  Concentration: Fair  Memory: Vander of knowledge:  Fair  Insight:   Fair  Judgment:  Fair  Impulse Control: Good   Risk Assessment: Danger to Self:  No Self-injurious Behavior: No Danger to Others: No Duty to Warn:no Physical Aggression / Violence:No  Access to Firearms a concern: No  Gang Involvement:No   Subjective: The patient described himself as doing well and reflected on Thanksgiving. He also talked about Christmas. From there, he stated that he felt like he was better and that he did not need any more counseling from this clinician. He voiced wanting to participate in a support group for people with Parkinson's disease. He reflected on what he learned about himself from therapy. He terminated therapy.Marland Kitchen He denied suicidal and homicidal ideation.   Interventions: Worked on developing a therapeutic relationship with the patient using active listening and reflective statements. Provided emotional support using empathy and validation.  Reflected on events since the last session. Praised patient for doing better. Explored what had assisted the patient. Processed what patient learned about self from  counseling. Explored what will help the patient continue moving in a positive direction. Reflected on how the therapeutic relationship assisted the patient. Assessed for suicidal and homicidal ideation  Diagnosis: F33.0 major depressive affective disorder, recurrent, mild  Plan: Patient terminated services. Patient stated that he had accomplished his goals in therapy, which can be found in therapycharts. The patient's treatment plan is in therapycharts and patient agreed to the plan discussed. The patient will not return for services at this time. If patient would like to come back to therapy he is welcomed to do so. Again, patient terminated counseling services with  this clinician.   Conception Chancy, PsyD

## 2021-02-10 ENCOUNTER — Other Ambulatory Visit: Payer: Medicare Other | Admitting: Licensed Clinical Social Worker

## 2021-02-13 ENCOUNTER — Other Ambulatory Visit: Payer: Self-pay

## 2021-02-13 ENCOUNTER — Other Ambulatory Visit: Payer: Medicare Other | Admitting: Licensed Clinical Social Worker

## 2021-02-19 ENCOUNTER — Other Ambulatory Visit: Payer: Self-pay

## 2021-02-19 ENCOUNTER — Ambulatory Visit: Payer: Medicare Other | Admitting: Licensed Clinical Social Worker

## 2021-02-19 DIAGNOSIS — F330 Major depressive disorder, recurrent, mild: Secondary | ICD-10-CM

## 2021-02-19 NOTE — BH Specialist Note (Signed)
Integrated Behavioral Health Initial In-Person Visit  MRN: [id number] Name: Patrick Ross  Number of [place] Clinician visits:: 1/6 Session Start time: 10:30  Session End time: 11:20 Total time: 50  minutes  Types of Service: Family psychotherapy  Interpretor:No. Interpretor Name and Language: NA   Warm Hand Off Completed.    LCSW met with both Pt and Caregiver at medical appointment in clinic.    Subjective: Patrick Ross is a 75 y.o. male accompanied by Spouse Patient was referred by Dr. Wells Guiles Tat  for Mild Depression and issues related to caregiver strain. Patient reports the following symptoms/concerns: Pt reports feelings sadness related to loss of his mobility and wondering if he will regain it back.  Feels of being a burden .  Pt's wife Patrick Ross present and wants more communication from pt about his feelings and reports feeling strain of caregiver stress.  Duration of problem: several months; Severity of problem: mild  Objective: Mood: Depressed and Affect: Appropriate Risk of harm to self or others: No plan to harm self or others  Life Context: Family and Social: Pt is retired and lives with wife .  Does have strong social connections.   School/Work: Pt and wife are retired , but do have strong social connections including church and exercise community. Self-Care: Pt nneds assistance with some ADL's  Life Changes: There has been some progression of the Parkinson's disease.   Patient and/or Family's Strengths/Protective Factors: Social connections  Goals Addressed: Patient will: Reduce symptoms of: depression and stress Increase knowledge and/or ability of: coping skills  Demonstrate ability to: Increase healthy adjustment to current life circumstances  Progress towards Goals: Ongoing  Interventions: Interventions utilized: Solution-Focused Strategies and Supportive Counseling  Standardized Assessments completed: Not Needed  Patient  and/or Family Response: LCSW talked briefly about depression and Parkinson's and mood and then self care tips.  In addition, the importance of keeping communication lines open between the two and a plan was made for weekly check in between them.  We discussed ways of processing emotions and exploring that and being opening about one's feelings.    Patient Centered Plan: Patient is on the following Treatment Plan(s):  Weekly Check In meetings where they check in with how each one is doing with script with feelings and encouragement on exploring and identifying feelings with being open with one another.    Assessment: Patient currently experiencing Pt reports some mild depressive feelings but also felt some burden and came in with his wife who felt some caregiver stress as well . Pt has had Parkinson's for 20 plus years .    Patient may benefit from On going support that is currently in place , for caregiver to have respite and break . To review and be open with communication with one another .  Plan: Follow up with behavioral health clinician on : Three to four weeks  Behavioral recommendations: Follow up with weekly check ins with one another and support services in place  Referral(s):  NA "From scale of 1-10, how likely are you to follow plan?": 10   Kashena Novitski A Taylor-Paladino, LCSW

## 2021-02-25 ENCOUNTER — Ambulatory Visit (INDEPENDENT_AMBULATORY_CARE_PROVIDER_SITE_OTHER): Payer: Medicare Other | Admitting: Urology

## 2021-02-25 ENCOUNTER — Other Ambulatory Visit: Payer: Self-pay

## 2021-02-25 ENCOUNTER — Encounter: Payer: Self-pay | Admitting: Urology

## 2021-02-25 VITALS — BP 152/85 | HR 88 | Wt 227.0 lb

## 2021-02-25 DIAGNOSIS — D1771 Benign lipomatous neoplasm of kidney: Secondary | ICD-10-CM | POA: Diagnosis not present

## 2021-02-25 DIAGNOSIS — N3941 Urge incontinence: Secondary | ICD-10-CM

## 2021-02-25 DIAGNOSIS — Z87442 Personal history of urinary calculi: Secondary | ICD-10-CM | POA: Diagnosis not present

## 2021-02-25 DIAGNOSIS — R8290 Unspecified abnormal findings in urine: Secondary | ICD-10-CM

## 2021-02-25 DIAGNOSIS — Z87440 Personal history of urinary (tract) infections: Secondary | ICD-10-CM | POA: Diagnosis not present

## 2021-02-25 LAB — URINALYSIS, ROUTINE W REFLEX MICROSCOPIC
Bilirubin, UA: NEGATIVE
Glucose, UA: NEGATIVE
Leukocytes,UA: NEGATIVE
Nitrite, UA: NEGATIVE
Protein,UA: NEGATIVE
RBC, UA: NEGATIVE
Specific Gravity, UA: 1.025 (ref 1.005–1.030)
Urobilinogen, Ur: 1 mg/dL (ref 0.2–1.0)
pH, UA: 6 (ref 5.0–7.5)

## 2021-02-25 MED ORDER — NITROFURANTOIN MONOHYD MACRO 100 MG PO CAPS
100.0000 mg | ORAL_CAPSULE | Freq: Every day | ORAL | 1 refills | Status: DC
Start: 2021-02-25 — End: 2022-02-08

## 2021-02-25 NOTE — Progress Notes (Signed)
Assessment: 1. History of UTI   2. Urge incontinence   3. Renal angiomyolipoma   4. History of nephrolithiasis     Plan: I personally reviewed the CT study from 01/20/2021. I discussed the CT results as well as the findings on cystoscopy with the patient and his wife today. Continue Myrbetriq  50 mg daily.   Continue daily Macrobid Given his continued incontinence symptoms and Parkinson's disease, I think further evaluation with urodynamics would be beneficial. Schedule for urodynamic evaluation at [facility] Return to office after urodynamic study completed.  Chief Complaint: Chief Complaint  Patient presents with   Cysto    HPI: Patrick Ross is a 75 y.o. 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  He has a history of urinary incontinence for a number of years.  He was last seen in [facility] in October 2021.  At that time he was having symptoms of frequency, urgency, nocturia x3 and urge incontinence.  He had previously been on Myrbetriq but had discontinued this as his insurance would not cover the medication.  At that time he had worsening of his urinary symptoms.  He was having nocturnal incontinence and was using incontinence diapers on a regular basis.  PVR = 11 mL.  He was given prescriptions for Myrbetriq and Vesicare.  Unfortunately his insurance would not cover either of these medications.  He was started on trospium 20 mg twice daily.  He was ultimately able to resume Myrbetriq 25 mg daily through the [place].  He is currently taking Myrbetriq 25 mg daily.  He continues to have frequency, urgency, nocturia x5, daytime urge incontinence, and nocturnal incontinence. IPSS = 13. He has recently had 2 UTIs.  A urine culture in August 2022 grew 50-100 K Enterococcus.  A urine culture from 11/22 grew >100 K E. coli.  He recently completed a  course of Macrobid.  Typical symptoms associate with UTI include dysuria.  No gross hematuria.   Urine culture from 01/15/2021 grew 50-100 K E. coli.  He was treated with cefdinir and started on daily Macrobid.  PSA from 9/22: 2.40  He has a history of a right renal angiomyolipoma and is status post a right partial nephrectomy in 4/20.  Follow-up imaging with a renal ultrasound in 7/20 showed bilateral simple renal cyst. He does have a prior history of nephrolithiasis.  No recent stone symptoms.  CT imaging from 01/20/2021 showed no evidence of urolithiasis, renal mass, or obstruction.  Mildly enlarged prostate with findings suggestive of chronic bladder outlet obstruction noted. He presents today for further evaluation with cystoscopy. He continues on daily Macrobid.  No UTI symptoms today.  No dysuria or gross hematuria. He is taking Myrbetriq 50 mg daily.  He has noted a slight improvement in his symptoms.  He continues to have some frequency, urgency, nocturia, sensation of incomplete emptying. IPSS = 17 today.  Portions of the above documentation were copied from a prior visit for review purposes only.  Allergies: Allergies  Allergen Reactions   Other Other (See Comments)    Surgical spray prior to adhesive causes blisters   Sulfamethoxazole Other (See Comments)    Unsure--childhood allergy    PMH: Past Medical History:  Diagnosis Date   Depression    DVT (deep venous thrombosis) (HCC)    GERD (gastroesophageal reflux disease)    History of kidney stones    passed  Hypertension    Hypothyroidism    Lumbar stenosis    OSA (obstructive sleep apnea)    wears cpap   Parkinson's disease ([place])    PONV (postoperative nausea and vomiting)    REM behavioral disorder    Urinary incontinence     PSH: Past Surgical History:  Procedure Laterality Date   BACK SURGERY     x 2 - not fusion   COLONOSCOPY     DEEP BRAIN STIMULATOR PLACEMENT     left 12/27/2012, right  12/10/2011, left revision 02/02/2013, replacement bilateral 11/21/2015   MINOR PLACEMENT OF FIDUCIAL N/A 01/09/2019   Procedure: Fiducial placement;  Surgeon: Erline Levine, MD;  Location: [place];  Service: Neurosurgery;  Laterality: N/A;  Fiducial placement   PARTIAL NEPHRECTOMY Right 2005 ish   PULSE GENERATOR IMPLANT Right 01/23/2019   Procedure: Right chest implantable pulse generator change;  Surgeon: Erline Levine, MD;  Location: [place];  Service: Neurosurgery;  Laterality: Right;   SPINAL FUSION     lumbar Fusion x 2   SUBTHALAMIC STIMULATOR BATTERY REPLACEMENT Left 02/02/2018   Procedure: Change implantable pulse generator battery, Left chest;  Surgeon: Erline Levine, MD;  Location: [place];  Service: Neurosurgery;  Laterality: Left;   SUBTHALAMIC STIMULATOR BATTERY REPLACEMENT N/A 01/22/2020   Procedure: REMOVAL OF LEFT BRAIN DEEP BRAIN STIMULATOR ELECTRODE, EXTENSION, AND PULSE GENERATOR;  Surgeon: Erline Levine, MD;  Location: [place];  Service: Neurosurgery;  Laterality: N/A;   SUBTHALAMIC STIMULATOR INSERTION Left 01/16/2019   Procedure: Left deep brain stimulator electrode revision.;  Surgeon: Erline Levine, MD;  Location: [place];  Service: Neurosurgery;  Laterality: Left;   SUBTHALAMIC STIMULATOR INSERTION Left 01/23/2019   Procedure: Repositioning of Left Deep Brain Stimulator Electrode;  Surgeon: Erline Levine, MD;  Location: [place];  Service: Neurosurgery;  Laterality: Left;   TONSILLECTOMY      SH: Social History   Tobacco Use   Smoking status: Never   Smokeless tobacco: Never  Vaping Use   Vaping Use: Never used  Substance Use Topics   Alcohol use: Not Currently   Drug use: Never    ROS: Constitutional:  Negative for fever, chills, weight loss CV: Negative for chest pain, previous MI, hypertension Respiratory:  Negative for shortness of breath, wheezing, sleep apnea, frequent cough GI:  Negative for nausea, vomiting, bloody stool, GERD  PE: BP (!) 152/85    Pulse 88     Wt 227 lb (103 kg)    BMI 29.15 kg/m  GENERAL APPEARANCE:  Well appearing, well developed, well nourished, NAD HEENT:  Atraumatic, normocephalic, oropharynx clear NECK:  Supple without lymphadenopathy or thyromegaly ABDOMEN:  Soft, non-tender, no masses EXTREMITIES:  Moves all extremities well, without clubbing, cyanosis, or edema NEUROLOGIC:  Alert and oriented x 3, normal gait, CN II-XII grossly intact MENTAL STATUS:  appropriate BACK:  Non-tender to palpation, No CVAT SKIN:  Warm, dry, and intact   Results: U/A: dipstick negative  Procedure:  Flexible Cystourethroscopy  Pre-operative Diagnosis:  UTI, LUTS  Post-operative Diagnosis:  UTI, LUTS  Anesthesia:  local with lidocaine jelly  Surgical Narrative:  After appropriate informed consent was obtained, the patient was prepped and draped in the usual sterile fashion in the supine position.  He was correctly identified and the proper procedure delineated prior to proceeding.  Sterile lidocaine gel was instilled in the urethra. The flexible cystoscope was introduced without difficulty.  Findings:  Anterior urethra: Normal  Posterior urethra:  mild lateral lobe enlargement, no median lobe  Bladder:  trabeculations, cellules, no mucosal lesions seen  Ureteral orifices: normal  Additional findings: None  Saline bladder wash for cytology was not performed.    The cystoscope was then removed.  The patient tolerated the procedure well.

## 2021-02-26 ENCOUNTER — Telehealth: Payer: Self-pay

## 2021-02-26 DIAGNOSIS — Z515 Encounter for palliative care: Secondary | ICD-10-CM

## 2021-02-26 NOTE — Telephone Encounter (Signed)
(  12:46 pm) Per request of Dr. Hollace Kinnier, Palliative SW completed a follow-up call to patient and his wife to discuss options for needed home modifications to remain in his home as his condition is declining. He report that he is having difficulty getting up and down the stairs. Patient served 20 years in [employer] and receives service-related disability and goes to [place]. SW suggested that he apply for the [place] [employer]) through the [place] as it is specific to service-related conditions (website provided). SW also suggested that they consider getting a stair lift. Patient and his wife advised that they will look into both resources.  SW provided her contact information for patient/spouse to follow-up if needed.

## 2021-03-03 ENCOUNTER — Other Ambulatory Visit: Payer: Self-pay | Admitting: Neurology

## 2021-03-03 DIAGNOSIS — G20 Parkinson's disease: Secondary | ICD-10-CM

## 2021-03-09 ENCOUNTER — Other Ambulatory Visit: Payer: Self-pay

## 2021-03-09 ENCOUNTER — Ambulatory Visit: Payer: Medicare Other | Admitting: Licensed Clinical Social Worker

## 2021-03-09 DIAGNOSIS — F330 Major depressive disorder, recurrent, mild: Secondary | ICD-10-CM

## 2021-03-09 MED ORDER — CLONAZEPAM 0.5 MG PO TABS: ORAL_TABLET | ORAL | 1 refills | Status: DC

## 2021-03-09 NOTE — BH Specialist Note (Signed)
Integrated Behavioral Health Follow Up In-Person Visit  MRN: [id number] Name: Patrick Ross  Number of [place] Clinician visits: 2/6 Session Start time: 3:00  Session End time: 3:45 Total time: 45  minutes  Types of Service: Family psychotherapy  Interpretor:No. Interpretor Name and Language: NA   Subjective: Patrick Ross is a 76 y.o. male accompanied by Spouse Patient was referred by Dr. Wells Guiles Tat  for Depression and Caregiver strain. Patient reports the following symptoms/concerns: Depressed mood, feelings of problems in relationship, issues with self worth and function in daily life.  Duration of problem: Several months; Severity of problem: moderate  Objective: Mood: Depressed and Affect: Appropriate Risk of harm to self or others: No plan to harm self or others  Life Context: Family and Social: Pt resides with his wife and does have family support  School/Work: Pt is retired and does not work  Self-Care: Pt does require assistance with some ADL's  Life Changes: Progression of Parkinson's and other medical issues involved  Patient and/or Family's Strengths/Protective Factors: Social connections  Goals Addressed: Patient will:  Reduce symptoms of: depression   Increase knowledge and/or ability of: coping skills and stress reduction   Demonstrate ability to: Increase healthy adjustment to current life circumstances and Increase adequate support systems for patient/family  Progress towards Goals: Ongoing  Interventions: Interventions utilized:  Motivational Interviewing, Solution-Focused Strategies, and Supportive Counseling Standardized Assessments completed: Not Needed  Patient and/or Family Response: Building upon last session of how communication allowing each other  to learn how to talk and listen to one another effectively .  They are open to ideas and have made some progress.  Continued with reducing feelings of depressed mood and  reminded of progress and ways of building upon the progress for both with Parkinson's and with communication as partner's with his spouse .    Patient Centered Plan: Patient is on the following Treatment Plan(s): self-check list of progress made with PT/OT and what goals moving forward .  Continue with active listening I statement and weekly check ins for communication increase openness of  feelings and to reduce caregiver strain. To review progress that Pt has made and validation to those thoughts.   Assessment: Patient currently experiencing Depressed mood and feelings with concern with communication with spouse and the strain of the caregiver  .  In addition,. Feelings of self worth , reported not seeing progress with PT and OT and concern for the future with care.   Patient may benefit from Communication exercises and interventions to help with being open with feelings and strengthening that relationship.  In addition, replace depressive promoting thoughts with mood elevating thoughts and look at the progress patient has made .  Plan: Follow up with behavioral health clinician on : 4 weeks  Behavioral recommendations: Continue with active listening,  "I"statement and weekly check ins , other communication tools provided .  Furthermore, continue with check list and have rewards for progress and steps made towards goals .  Referral(s):  No additional referral  "From scale of 1-10, how likely are you to follow plan?": 10  Rosendo Couser A Taylor-Paladino, LCSW

## 2021-03-11 ENCOUNTER — Other Ambulatory Visit: Payer: Self-pay | Admitting: Neurology

## 2021-03-11 DIAGNOSIS — F330 Major depressive disorder, recurrent, mild: Secondary | ICD-10-CM

## 2021-03-11 DIAGNOSIS — G20 Parkinson's disease: Secondary | ICD-10-CM

## 2021-04-02 ENCOUNTER — Other Ambulatory Visit: Payer: Self-pay | Admitting: Neurology

## 2021-04-02 ENCOUNTER — Telehealth: Payer: Self-pay | Admitting: Neurology

## 2021-04-02 NOTE — Telephone Encounter (Signed)
Called patients wife and they received new medication and are re enrolled in the program. They received a  form stating tat there could be an interaction but we went over all medications and patient is good to continue medication at this time

## 2021-04-02 NOTE — Telephone Encounter (Signed)
Patients wife called stating they received a letter in the mail about the medication Inbrija.  It stated the med was expired, she just has questions on what the letter means.  The letter was from [place].

## 2021-04-03 ENCOUNTER — Encounter: Payer: Self-pay | Admitting: Urology

## 2021-04-03 ENCOUNTER — Ambulatory Visit (INDEPENDENT_AMBULATORY_CARE_PROVIDER_SITE_OTHER): Payer: Medicare Other | Admitting: Urology

## 2021-04-03 ENCOUNTER — Other Ambulatory Visit: Payer: Self-pay

## 2021-04-03 VITALS — BP 120/67 | HR 91 | Wt 227.0 lb

## 2021-04-03 DIAGNOSIS — N3941 Urge incontinence: Secondary | ICD-10-CM

## 2021-04-03 DIAGNOSIS — Z87440 Personal history of urinary (tract) infections: Secondary | ICD-10-CM | POA: Diagnosis not present

## 2021-04-03 DIAGNOSIS — D1771 Benign lipomatous neoplasm of kidney: Secondary | ICD-10-CM

## 2021-04-03 LAB — URINALYSIS, ROUTINE W REFLEX MICROSCOPIC
Bilirubin, UA: NEGATIVE
Glucose, UA: NEGATIVE
Leukocytes,UA: NEGATIVE
Nitrite, UA: NEGATIVE
Protein,UA: NEGATIVE
RBC, UA: NEGATIVE
Specific Gravity, UA: 1.02 (ref 1.005–1.030)
Urobilinogen, Ur: 2 mg/dL — ABNORMAL HIGH (ref 0.2–1.0)
pH, UA: 7 (ref 5.0–7.5)

## 2021-04-03 NOTE — Progress Notes (Signed)
Urological Symptom Review  Patient is experiencing the following symptoms: Frequent urination Hard to postpone urination Get up at night to urinate Leakage of urine Have to strain to urinate Erection problems (male only)   Review of Systems  Gastrointestinal (upper)  : Negative for upper GI symptoms  Gastrointestinal (lower) : Constipation  Constitutional : Negative for symptoms  Skin: Negative for skin symptoms  Eyes: Blurred vision  Ear/Nose/Throat : Negative for Ear/Nose/Throat symptoms  Hematologic/Lymphatic: Negative for Hematologic/Lymphatic symptoms  Cardiovascular : Negative for cardiovascular symptoms  Respiratory : Negative for respiratory symptoms  Endocrine: Negative for endocrine symptoms  Musculoskeletal: Back pain  Neurological: Negative for neurological symptoms  Psychologic: Negative for psychiatric symptoms

## 2021-04-03 NOTE — Progress Notes (Signed)
Assessment: 1. Urge incontinence   2. History of UTI   3. 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  I discussed these results with the patient and his wife today.  He does have evidence of a decreased bladder capacity and bladder instability with associated leakage.  He currently has a minimal response to medical therapy.  Given his severe Parkinson's disease and refractory urinary incontinence, I think referral to a incontinence specialist is advisable. Continue Myrbetriq  50 mg daily.   Continue daily Macrobid Refer to Dr. Nicki Reaper  MacDiarmid at [facility]  Return to office in 3 months  Chief Complaint: Chief Complaint  Patient presents with   Urinary Incontinence    HPI: Patrick Ross is a 76 y.o. 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  He has a history of urinary incontinence for a number of years.  He was last seen in [facility] in October 2021.  At that time he was having symptoms of frequency, urgency, nocturia x3 and urge incontinence.  He had previously been on Myrbetriq but had discontinued this as his insurance would not cover the medication.  At that time, he had worsening of his urinary symptoms.  He was having nocturnal incontinence and was using incontinence diapers on a regular basis.  PVR = 11 mL.  He was given prescriptions for Myrbetriq and Vesicare.  Unfortunately his insurance would not cover either of these medications.  He was started on trospium 20 mg twice daily.  He was ultimately able to resume Myrbetriq 25 mg daily through the [place].  He was taking Myrbetriq 25 mg daily.  He continued to have frequency, urgency, nocturia x5, daytime urge incontinence, and nocturnal incontinence. IPSS = 13. He was diagnosed with 2 UTIs.  A urine culture  in August 2022 grew 50-100 K Enterococcus.  A urine culture from 11/22 grew >100 K E. coli.  He completed a course of Macrobid.  Typical symptoms associate with UTI include dysuria.  No gross hematuria.   Urine culture from 01/15/2021 grew 50-100 K E. coli.  He was treated with cefdinir and started on daily Macrobid.  PSA from 9/22: 2.40  He has a history of a right renal angiomyolipoma and is status post a right partial nephrectomy in 4/20.  Follow-up imaging with a renal ultrasound in 7/20 showed bilateral simple renal cyst. He does have a prior history of nephrolithiasis.  No recent stone symptoms.  CT imaging from 01/20/2021 showed no evidence of urolithiasis, renal mass, or obstruction.  Mildly enlarged prostate with findings suggestive of chronic bladder outlet obstruction noted. He continued on daily Macrobid.   He is taking Myrbetriq 50 mg daily with a slight improvement in his symptoms.  He continued to have some frequency, urgency, nocturia, sensation of incomplete emptying. IPSS = 17. Cystoscopy from 02/25/2021 showed mild enlargement of the prostate with bladder trabeculations and cellules. He was evaluated with urodynamics on 03/06/2021.  This study showed a decreased bladder capacity of 140 mL with instability.  He did have urgency and incontinence associated with the instability.  He was able to generate a voluntary contraction and voided 120 mL with a max flow of 10 mL/s.  PVR was 57 mL.  He returns today for follow-up.  He continues on Myrbetriq 50 mg daily as well as daily  Macrobid.  His urinary symptoms are essentially unchanged.  He continues with frequency, urgency, nocturia, and incontinence.  He is using a condom catheter at night for management of his incontinence.  No dysuria or gross hematuria. IPSS = 25 today.  Portions of the above documentation were copied from a prior visit for review purposes only.  Allergies: Allergies  Allergen Reactions   Other Other (See  Comments)    Surgical spray prior to adhesive causes blisters   Sulfamethoxazole Other (See Comments)    Unsure--childhood allergy    PMH: Past Medical History:  Diagnosis Date   Depression    DVT (deep venous thrombosis) (HCC)    GERD (gastroesophageal reflux disease)    History of kidney stones    passed   Hypertension    Hypothyroidism    Lumbar stenosis    OSA (obstructive sleep apnea)    wears cpap   Parkinson's disease (HCC)    PONV (postoperative nausea and vomiting)    REM behavioral disorder    Urinary incontinence     PSH: Past Surgical History:  Procedure Laterality Date   BACK SURGERY     x 2 - not fusion   COLONOSCOPY     DEEP BRAIN STIMULATOR PLACEMENT     left 12/27/2012, right 12/10/2011, left revision 02/02/2013, replacement bilateral 11/21/2015   MINOR PLACEMENT OF FIDUCIAL N/A 01/09/2019   Procedure: Fiducial placement;  Surgeon: Erline Levine, MD;  Location: [place];  Service: Neurosurgery;  Laterality: N/A;  Fiducial placement   PARTIAL NEPHRECTOMY Right 2005 ish   PULSE GENERATOR IMPLANT Right 01/23/2019   Procedure: Right chest implantable pulse generator change;  Surgeon: Erline Levine, MD;  Location: [place];  Service: Neurosurgery;  Laterality: Right;   SPINAL FUSION     lumbar Fusion x 2   SUBTHALAMIC STIMULATOR BATTERY REPLACEMENT Left 02/02/2018   Procedure: Change implantable pulse generator battery, Left chest;  Surgeon: Erline Levine, MD;  Location: [place];  Service: Neurosurgery;  Laterality: Left;   SUBTHALAMIC STIMULATOR BATTERY REPLACEMENT N/A 01/22/2020   Procedure: REMOVAL OF LEFT BRAIN DEEP BRAIN STIMULATOR ELECTRODE, EXTENSION, AND PULSE GENERATOR;  Surgeon: Erline Levine, MD;  Location: [place];  Service: Neurosurgery;  Laterality: N/A;   SUBTHALAMIC STIMULATOR INSERTION Left 01/16/2019   Procedure: Left deep brain stimulator electrode revision.;  Surgeon: Erline Levine, MD;  Location: [place];  Service: Neurosurgery;  Laterality: Left;    SUBTHALAMIC STIMULATOR INSERTION Left 01/23/2019   Procedure: Repositioning of Left Deep Brain Stimulator Electrode;  Surgeon: Erline Levine, MD;  Location: [place];  Service: Neurosurgery;  Laterality: Left;   TONSILLECTOMY      SH: Social History   Tobacco Use   Smoking status: Never   Smokeless tobacco: Never  Vaping Use   Vaping Use: Never used  Substance Use Topics   Alcohol use: Not Currently   Drug use: Never    ROS: Constitutional:  Negative for fever, chills, weight loss CV: Negative for chest pain, previous MI, hypertension Respiratory:  Negative for shortness of breath, wheezing, sleep apnea, frequent cough GI:  Negative for nausea, vomiting, bloody stool, GERD  PE: BP 120/67    Pulse 91    Wt 227 lb (103 kg)    BMI 29.15 kg/m  GENERAL APPEARANCE:  Well appearing, well developed, well nourished, NAD HEENT:  Atraumatic, normocephalic, oropharynx clear NECK:  Supple without lymphadenopathy or thyromegaly ABDOMEN:  Soft, non-tender, no masses EXTREMITIES:  Moves all extremities well, without clubbing, cyanosis, or edema NEUROLOGIC:  Alert and oriented x 3, normal gait, CN II-XII grossly intact MENTAL STATUS:  appropriate BACK:  Non-tender to palpation, No CVAT SKIN:  Warm, dry, and intact   Results: U/A dipstick negative

## 2021-04-13 ENCOUNTER — Ambulatory Visit: Payer: Medicare Other | Admitting: Licensed Clinical Social Worker

## 2021-04-13 ENCOUNTER — Other Ambulatory Visit: Payer: Self-pay

## 2021-04-13 DIAGNOSIS — Z630 Problems in relationship with spouse or partner: Secondary | ICD-10-CM

## 2021-04-13 NOTE — Patient Instructions (Signed)
Continue with weekly check-ins and using techniques discussed with stating of feelings and repeat back .  In addition, using techniques discussed to strengthen communication and bond with one another.  Finally look at cause of some of the stressors associated with illness and ways of removing some of the barriers or obstacles .

## 2021-04-13 NOTE — BH Specialist Note (Signed)
Integrated Behavioral Health Follow Up In-Person Visit  MRN: [id number] Name: Patrick Ross  Number of [place] Clinician visits: 3- Third Visit  Session Start time: 1400   Session End time: 1450  Total time in minutes: 50   Types of Service: Family psychotherapy  Interpretor:No. Interpretor Name and Language: NA   Subjective: Patrick Ross is a 76 y.o. male accompanied by Spouse Patient was referred by Dr. Wells Guiles Tat for Relationship Conflict . Patient reports the following symptoms/concerns: Strain on relationship due to matters and progression of Parkinson's  Duration of problem: Several Months; Severity of problem: mild  Objective: Mood: NA and Affect: Appropriate Risk of harm to self or others: No plan to harm self or others  Life Context: Family and Social: Pt is retired and lives with wife .  Has strong ties to church community  School/Work: Pt is retired  Self-Care: Pt does require some assistance with ADL's  Life Changes: Pt has Parkinson's and manages issues associated with progression.   Patient and/or Family's Strengths/Protective Factors: Social connections and [place] supports in place (healthy food, safe environments, etc.)  Goals Addressed: Patient will:  Reduce symptoms of: depression and stress   Increase knowledge and/or ability of: coping skills and stress reduction   Demonstrate ability to: Increase healthy adjustment to current life circumstances  Progress towards Goals: Ongoing  Interventions: Interventions utilized:  Behavioral Activation and Supportive Counseling Standardized Assessments completed: Not Needed  Patient and/or Family Response: Both patient and caregiver are open to inventions and tools to strengthen their bond and reduce the strain and stress associated with Parkinson's   Patient Centered Plan: Patient is on the following Treatment Plan(s): To continue with weekly check in , ease stressful emotions,  self care for both and reduce barriers to goals they both have .   Assessment: Patient currently experiencing depressed mood, and stress strain on material relationship due to managing chronic issues associated with Parkinson's .   Patient and caregiver may benefit from continue discussion can lead to feelings of stress and strain. Furthermore, weekly check ins to talk opening  , also to look at some of the root cause of the stress associated to Parkinson's and work together as a team for strategies  on reducing stress and look at the barriers that are in place and removing those   Plan: Follow up with behavioral health clinician on :  In 4 weeks or as needed  Behavioral recommendations: For Pt and caregiver to continue discussion can lead to feelings of stress and strain. Furthermore, weekly check ins to talk opening  , also to look at some of the root cause of the stress associated to Parkinson's and work together as a team for strategies  on reducing stress and look at the barriers that are in place and removing those  Referral(s):  No referrals  "From scale of 1-10, how likely are you to follow plan?": 10  Patrick Guggenheim A Taylor-Paladino, LCSW

## 2021-04-14 ENCOUNTER — Other Ambulatory Visit: Payer: Self-pay

## 2021-04-14 ENCOUNTER — Telehealth: Payer: Self-pay | Admitting: Neurology

## 2021-04-14 DIAGNOSIS — G20 Parkinson's disease: Secondary | ICD-10-CM

## 2021-04-14 MED ORDER — CARBIDOPA-LEVODOPA 25-100 MG PO TABS: ORAL_TABLET | ORAL | 0 refills | Status: DC

## 2021-04-14 NOTE — Telephone Encounter (Signed)
Called patients wife and assured her I have not received a request or I would have sent it in right away. Prescription has been sent to [facility] today

## 2021-04-14 NOTE — Telephone Encounter (Signed)
Pt wife wants to know why seiji has not got his refill for carbidopa/levo 25-100.

## 2021-04-15 ENCOUNTER — Telehealth: Payer: Self-pay | Admitting: Neurology

## 2021-04-15 ENCOUNTER — Other Ambulatory Visit: Payer: Self-pay

## 2021-04-15 DIAGNOSIS — G20 Parkinson's disease: Secondary | ICD-10-CM

## 2021-04-15 MED ORDER — CARBIDOPA-LEVODOPA 25-100 MG PO TABS: ORAL_TABLET | ORAL | 0 refills | Status: DC

## 2021-04-15 NOTE — Telephone Encounter (Signed)
Called patient back and she is using the [employer] card so she should not need the insurance for this emergency 75 pills to hold her over until the mail order comes in from [facility]. Spoke to [employer] and the have put that  in for the patient with an amount of 21$

## 2021-04-15 NOTE — Telephone Encounter (Signed)
Patient needs to speak with chelsea about [facility] carbidopa/levo. Ins will not pay for the script that was sent.

## 2021-04-15 NOTE — Telephone Encounter (Signed)
Called patients wife back and have sent a  7 day supply to the [facility] in [place] while patient is waiting on prescription to be [facility]

## 2021-04-15 NOTE — Telephone Encounter (Signed)
Patients wife called back and stated the insurance will not pay for the medication until Feb the 17, it is too early.

## 2021-04-15 NOTE — Telephone Encounter (Signed)
Patients wife and wanted to see if Josephina Shih could get a short term supply of his carbidopa-levodopa.  [facility] is processing his request, but he only has a 5 day supply left.

## 2021-04-28 ENCOUNTER — Other Ambulatory Visit: Payer: Self-pay

## 2021-04-28 ENCOUNTER — Other Ambulatory Visit: Payer: Medicare Other | Admitting: Internal Medicine

## 2021-04-28 VITALS — BP 116/74 | HR 84 | Temp 97.7°F

## 2021-04-28 DIAGNOSIS — R2689 Other abnormalities of gait and mobility: Secondary | ICD-10-CM

## 2021-04-28 DIAGNOSIS — G20 Parkinson's disease: Secondary | ICD-10-CM

## 2021-04-28 DIAGNOSIS — N3941 Urge incontinence: Secondary | ICD-10-CM

## 2021-04-28 DIAGNOSIS — G20A1 Parkinson's disease without dyskinesia, without mention of fluctuations: Secondary | ICD-10-CM

## 2021-04-28 NOTE — Progress Notes (Signed)
[place] Visit Telephone: [phone]  Fax: [phone]   Date of encounter: 04/28/21 7:56 AM PATIENT NAME: Patrick Ross [address] [address]   [phone] (home) [phone] (work) DOB: [birth year] MRN: [id number] PRIMARY CARE PROVIDER:    Jefm Petty, MD,  [address] [place] [address] [phone]  REFERRING PROVIDER:   Alonza Bogus, neurology  RESPONSIBLE PARTY:    Contact Information     Name Relation Home Work Mobile   Scammon Spouse [phone]  [phone]        I met face to face with patient and family in his home. Palliative Care was asked to follow this patient by consultation request of  Jefm Petty, MD to address advance care planning and complex medical decision making. This is follow-up visit.                                     ASSESSMENT AND PLAN / RECOMMENDATIONS:   Advance Care Planning/Goals of Care: Goals include to maximize quality of life and symptom management.  CODE STATUS:  DNR, MOST on file--no changes made today  Symptom Management/Plan: 1. Parkinson's disease ([place]) -cont sinemet, amantadine, inbija; also twice a week cycling at the gym to stay active (missed last week due to URI)  2. Urge incontinence -continues to follow with urology and saw urology at [facility] most recently who recommended taking him off the abx prophylaxis for UTIs, continues on myrbetriq and has an appt with another specialist that Dr. Felipa Eth sent him to locally -continue scheduled voiding, but be more adherent when Jackelyn Poling suggests he go  3. Decreased functional mobility -due to his PD, he is challenged going up and down the stairs--large flight in home and there are areas without grab bars; discussed stair lift again today and working on this arrangement before it is an urgent need or a fall occurs  Follow up Palliative Care Visit: Palliative care will  continue to follow for complex medical decision making, advance care planning, and clarification of goals. Return 4 weeks or prn.  Jun 02, 2021  This visit was coded based on medical decision making (MDM).  PPS: 50%  HOSPICE ELIGIBILITY/DIAGNOSIS:  Not at this time; parkinson's  Chief Complaint: Follow-up palliative visit  HISTORY OF PRESENT ILLNESS:  Patrick Ross is a 76 y.o. year old male  with PMH of parkinson's disease, depression, HTN, hypothyroid, lumbar stenosis, GERD, OSA, and urge incontinence. Patient had a could with cough last week and did not go to gym or PT. Feeling better today and went for a long walk yesterday and enjoyed getting out. Appointment with [facility] - who suggesting trying a "duck" for nocturia, however, patient was unable to do this due to acute left side pain with sitting up. Continues to use condom catheter at night. Doing better with day time incontinence using a Q2H schedule. Still taking prophylactic ABX for UTI. [facility] cannot see specialists outside of [place], but they have an appointment on 3/15 with outside urology and may keep this to hear other options to manage incontinence.   Reports fluctuating HR at rest and with activity & elevated diastolic BP's: 37'T-024. Found BP discrepancy between left and right side's.  Patient states they did apply for additional disability benefits through [place] to help with home safety modification - stair lift or building ground level bedroom.  Still able to get up the stairs and reports no falls but a few "slips." Increased difficulty later in the evening. Takes last dose of meds at 5:00 pm and is considering taking one additional dose, which Dr. Carles Collet states is okay for him to do this. Difficulty standing from lower seats.   DBS removed from left side and will not replace.   Using expiratory muscle trainer and continues with Inbrija (levadopa) inhaler 1-2x/day (can take up to 5x/day)   History obtained from review  of EMR, discussion with primary team, and interview with family, facility staff/caregiver and/or Patrick Ross.  I reviewed available labs, medications, imaging, studies and related documents from the EMR.  Records reviewed and summarized above.   ROS  General: NAD EYES: denies vision changes ENMT: denies dysphagia Cardiovascular: denies chest pain, denies DOE Pulmonary:has cough with URI nearly resolved, denies increased SOB Abdomen: endorses good appetite, denies constipation, endorses continence of bowel GU: denies dysuria, endorses incontinence of urine and urgency MSK:  denies increased weakness,  no falls reported Skin: denies rashes or wounds Neurological: reports pain chronically on right side and intermittently on left (especially when sitting up in bed), denies insomnia Psych: Endorses positive mood Heme/lymph/immuno: denies bruises, abnormal bleeding  Physical Exam: 95% RA, 84 pulse, 116/74 (manual on left side) and 122/78 (automatic cuff on left side), 145/94 (automatic cuff on right side) 97.7 temp. Constitutional: NAD General: WNWD EYES: anicteric sclera, lids intact, no discharge , glasses ENMT: hard of hearing, oral mucous membranes moist, dentition intact CV: S1S2, RRR, no LE edema Pulmonary: LCTA, no increased work of breathing, dry cough, room air Abdomen: intake 100%, normo-active BS + 4 quadrants, soft and non tender, no ascites GU: deferred MSK: Right upper extremity increased stiffness. Left foot taps slower. 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   History of nephrolithiasis 01/15/2021   Resolved Ambulatory Problems    Diagnosis Date Noted   No Resolved Ambulatory Problems   Past Medical History:  Diagnosis Date   Depression    DVT (deep venous thrombosis) (HCC)    GERD (gastroesophageal reflux disease)    History of kidney stones    Hypertension    Hypothyroidism    Lumbar stenosis    OSA (obstructive sleep apnea)  PONV (postoperative nausea and vomiting)    REM behavioral disorder    Urinary incontinence    SOCIAL HX:  Social History   Tobacco Use   Smoking status: Never   Smokeless tobacco: Never  Substance Use Topics   Alcohol use: Not Currently     ALLERGIES:  Allergies  Allergen Reactions   Other Other (See Comments)    Surgical spray prior to adhesive causes blisters   Sulfamethoxazole Other (See Comments)    Unsure--childhood allergy     PERTINENT MEDICATIONS:  Outpatient Encounter Medications as of 04/28/2021  Medication Sig   amantadine (SYMMETREL) 100 MG capsule TAKE 1 CAPSULE TWICE A DAY   AMBULATORY NON FORMULARY MEDICATION Lift chair Dx:  G20 (Patient  taking differently: Lift chair Dx:  G20)   carbidopa-levodopa (SINEMET IR) 25-100 MG tablet 2 at 7am, 2 at 9:30am, 2 at noon, 2 at 2:30 pm, 2 at 5pm.  If you need an extra late evening, you may take 1 extra   clonazePAM (KLONOPIN) 0.5 MG tablet 1.5 tabs at bed   escitalopram (LEXAPRO) 20 MG tablet TAKE 1 TABLET DAILY   famotidine (PEPCID) 20 MG tablet Take 20 mg by mouth 2 (two) times daily.   Levodopa (INBRIJA) 42 MG CAPS ORALLY INHALE CONTENTS OF 2 CAPSULES AS NEEDED FOR SYMPTOMS OF AN OFF PERIOD (DO NOT EXCEED 5 DOSES IN 1 DAY)   levothyroxine (SYNTHROID) 125 MCG tablet Take 125 mcg by mouth daily.   lisinopril (PRINIVIL,ZESTRIL) 20 MG tablet Take 20 mg by mouth daily.   mirabegron ER (MYRBETRIQ) 25 MG TB24 tablet Take 25 mg by mouth at bedtime.   nitrofurantoin, macrocrystal-monohydrate, (MACROBID) 100 MG capsule Take 1 capsule (100 mg total) by mouth daily. Begin after completing Cefdinir   OVER THE COUNTER MEDICATION Take 2 tablets by mouth 2 (two) times daily. Alpha CRS otc supplement   OVER THE COUNTER MEDICATION Take 2 capsules by mouth 2 (two) times daily. Microplex VMz otc supplement   OVER THE COUNTER MEDICATION Take 2 capsules by mouth 2 (two) times daily. XEOmega 3 oil complex   OVER THE COUNTER MEDICATION Take 1 tablet by mouth daily. onguard+ otc supplement   OVER THE COUNTER MEDICATION Take 3 capsules by mouth at bedtime. terrazyme supplement   OVER THE COUNTER MEDICATION Take 1 tablet by mouth 2 (two) times daily. Bone nutrient otc supplement   OVER THE COUNTER MEDICATION Take 1 capsule by mouth at bedtime. PB Assist pro/prebiotic   rosuvastatin (CRESTOR) 20 MG tablet Take 20 mg by mouth daily.   No facility-administered encounter medications on file as of 04/28/2021.   Thank you for the opportunity to participate in the care of Patrick Ross.  The palliative care team will continue to follow. Please call our office at [phone] if we can be of additional assistance.   Hollace Kinnier, DO  COVID-19 PATIENT SCREENING TOOL Asked and negative response unless otherwise noted:  Have you had symptoms of covid, tested positive or been in contact with someone with symptoms/positive test in the past 5-10 days? no

## 2021-04-29 ENCOUNTER — Encounter: Payer: Self-pay | Admitting: Internal Medicine

## 2021-05-11 ENCOUNTER — Ambulatory Visit: Payer: Medicare Other | Admitting: Licensed Clinical Social Worker

## 2021-05-11 ENCOUNTER — Other Ambulatory Visit: Payer: Self-pay

## 2021-05-11 DIAGNOSIS — Z630 Problems in relationship with spouse or partner: Secondary | ICD-10-CM

## 2021-05-11 NOTE — BH Specialist Note (Signed)
Integrated Behavioral Health Follow Up In-Person Visit ? ?MRN: [id number] ?Name: Patrick Ross ? ?Number of [facility] Clinician visits: 4- Fourth Visit ? ?Session Start time: 0200 ?  ?Session End time: 5974 ? ?Total time in minutes: 55 ? ? ?Types of Service: Family psychotherapy ? ?Interpretor:No. Interpretor Name and Language: NA ? ? ?Subjective: ?Patrick Ross is a 76 y.o. male accompanied by Spouse ?Patient was referred by Dr. Wells Guiles  Tat for caregiver strain and conflictual relationship between one another with communication related to progression of Parkinson's  . ?Patient reports the following symptoms/concerns: Pt reports strain in communication between one another and feelings of depressed mood and shutdown of openness of communication.  Caregiver reports feelings of strain of caregiver and stress associated of being a caregiver .   ?Duration of problem: Several months ; Severity of problem: mild ? ?Objective: ?Mood: NA and Affect: Appropriate ?Risk of harm to self or others: No plan to harm self or others ? ?Life Context: ?Family and Social: Pt resides with his wife and has strong ties in the community .   ?School/Work: Pt is retired both him an his wife  ?Self-Care: He does require assistance with ADL's  ?Life Changes: Progression of parkinson's noted and reported being DX for over 20 years  ? ?Patient and/or Family's Strengths/Protective Factors: ?Social connections, Social and Emotional competence, and Concrete supports in place (healthy food, safe environments, etc.) ? ?Goals Addressed: ?Patient will: ? Reduce symptoms of: depression, stress, and Strain in relationship and caregiver burden    ? Increase knowledge and/or ability of: coping skills, stress reduction, and Learn awareness of own role in relationship conflict and ways of effective communication    ? Demonstrate ability to: Increase healthy adjustment to current life circumstances and Increase adequate support systems for  patient/family ? ?Progress towards Goals: ?Ongoing ? ?Interventions: ?Interventions utilized:  Solution-Focused Strategies, Supportive Counseling, and Communication Skills ?Standardized Assessments completed: Not Needed ? ?Patient and/or Family Response: Pt and Spouse open to therapeutic interventions  of strengthening their relationship and ways of communicating with one another.  In addition, reducing caregiver burden and increasing pleasurable activities between them as a couple    ? ?Patient Centered Plan: ?Patient is on the following Treatment Plan(s): Pt and Spouse open to therapeutic interventions  of strengthening their relationship and ways of communicating with one another.  In addition, reducing caregiver burden and increasing pleasurable activities between them as a couple    ?Assessment: ?Patient currently experiencing depression, stress, and Strain in relationship and caregiver burden .  In addition, would like to address issues related  to incontinence to have more time to exercise and more time socially and with his wife.  ? ?Patient may benefit from Specialized Services for those with Parkinson treatment options such as behavioral modification, pharmacologic . In addition, therapeutic interventions  of strengthening their relationship and ways of communicating with one another.  ? ?Plan: ?Follow up with behavioral health clinician on : Four Weeks or as needed  ?Behavioral recommendations: To follow up with specialized Urology  ?Referral(s):  None noted  ?"From scale of 1-10, how likely are you to follow plan?": 9 ? ?Tarin Johndrow A Taylor-Paladino, LCSW ? ? ?

## 2021-05-28 ENCOUNTER — Encounter: Payer: Self-pay | Admitting: Internal Medicine

## 2021-05-28 ENCOUNTER — Other Ambulatory Visit: Payer: Medicare Other | Admitting: Internal Medicine

## 2021-05-28 VITALS — BP 118/78 | HR 90

## 2021-05-28 DIAGNOSIS — G20A1 Parkinson's disease without dyskinesia, without mention of fluctuations: Secondary | ICD-10-CM

## 2021-05-28 DIAGNOSIS — H04123 Dry eye syndrome of bilateral lacrimal glands: Secondary | ICD-10-CM

## 2021-05-28 DIAGNOSIS — K5900 Constipation, unspecified: Secondary | ICD-10-CM

## 2021-05-28 DIAGNOSIS — N3941 Urge incontinence: Secondary | ICD-10-CM

## 2021-05-28 DIAGNOSIS — K592 Neurogenic bowel, not elsewhere classified: Secondary | ICD-10-CM

## 2021-05-28 DIAGNOSIS — G20 Parkinson's disease: Secondary | ICD-10-CM

## 2021-05-28 DIAGNOSIS — R2689 Other abnormalities of gait and mobility: Secondary | ICD-10-CM

## 2021-05-28 DIAGNOSIS — Z515 Encounter for palliative care: Secondary | ICD-10-CM

## 2021-05-28 NOTE — Progress Notes (Signed)
[facility] Palliative Care Follow-Up Visit Telephone: [phone]  Fax: [phone]   Date of encounter: 05/28/21 8:51 AM PATIENT NAME: Patrick Ross [address] [id number]   [phone] (home) [phone] (work) DOB: March 24, 1945 MRN: [id number] PRIMARY CARE PROVIDER:    Jefm Petty, MD,  [address] [place] [address] [phone]  REFERRING PROVIDER:   Jefm Petty, MD [address] [place],  [place] [address] [phone]  RESPONSIBLE PARTY:    Contact Information     Name Relation Home Work Mobile   [place] Spouse [phone]  [phone]        I met face to face with patient and wife, Patrick Ross, in his home. Palliative Care was asked to follow this patient by consultation request of  Jefm Petty, MD to address advance care planning and complex medical decision making. This is follow-up visit.                                     ASSESSMENT AND PLAN / RECOMMENDATIONS:   Advance Care Planning/Goals of Care: Goals include to maximize quality of life and symptom management. Patient/health care surrogate gave his/her permission to discuss.Our advance care planning conversation included a discussion about:    The value and importance of advance care planning  Experiences with loved ones who have been seriously ill or have died  Exploration of personal, cultural or spiritual beliefs that might influence medical decisions  Exploration of goals of care in the event of a sudden injury or illness  Identification  of a healthcare agent  Review and updating or creation of an  advance directive document . Decision not to resuscitate or to de-escalate disease focused treatments due to poor prognosis. CODE STATUS:  Has MOST we previously completed on file:  full code, full scope, abx if indicated, IVF if indicated and feeding tube for a defined trial period--he is aware that we can void  and do a new form at any time if he changes his mind about these wishes as his disease progresses.  Symptom Management/Plan: 1. Parkinson's disease ([place]) -cont current regimen of sinemet and inbrija, amantadine, one deep nerve stimulator remains in place, klonopin for rest and anxiety -completing ST for hypophonia and encouraged him to maintain his program after therapy stops  2. Urge incontinence -continue gemtessa, adhere to scheduled toileting as much as possible, hydrate with water, cont condom cath at night per his wishes, and f/u with urology for possible PTNS option  3. Decreased functional mobility -is nearing completion of outpatient PT, encouraged him to maintain this exercise program including sit-to-stand which has helped his mobility tremendously; Patrick Ross was going to ask PT about the stairs more b/c he's struggling to lift his one foot (some drop due to prior surgery it seems) -they are seeking a single story home and he has had his service connectedness rating increased to 90% which will be a big help for further needs  4. 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 Dry eye syndrome of both eyes -cont dry eye drops and moist warm compresses  6. Palliative care encounter -continue to follow at home over time -they are opting to avoid stair lift installation in favor of finding a  one story home in the near future to accommodate them better for years to come    Follow up Palliative Care Visit: Palliative care will continue to follow for complex medical decision making, advance care planning, and clarification of goals. Return 6-8 weeks or prn.  07/29/21  This visit was coded based on medical decision making (MDM).  PPS: 50%  HOSPICE ELIGIBILITY/DIAGNOSIS: Not currently/Parkinson's  Chief Complaint: Follow-up palliative  visit  HISTORY OF PRESENT ILLNESS:  Patrick Ross is a 76 y.o. year old male  with  Parkinson's disease, overactive bladder, BPH with LUTS, multiple prior back surgeries, depression, dry eyes, among others seen for palliative care f/u at home.   Is now on gemtessa for his OAB.  Knows scheduled toileting helps the most, but he does not always do it--alarms will go off but he'll be in the middle of something and ignore them, may have accidents if urge comes b/c then it's too late.  He follows up with Dr. Antony Contras consider nerve stimulation if gemtessa not a success.   Says he stays bored most of the time b/c he can't get out and drive and his balance is poor.  He is walking about a 1/2 mile now once a week.  He is still cycling tues and thurs am.  Will ride for 40-45 mins.  He's supposed to do sit to stands for PT.  Has good and bad days with it.    Having more trouble finding words.  HR was going down during cycling.    Bowels are moving, but not on a regular schedule--every couple to three days.  Still battling about water intake.    Using refresh drops, warm compresses for dry eyes.      Finishes up his PT and ST next week.  He's been going out to this.    Going upstairs, it's getting harder to straighten the left leg so he can barely make it onto the step.  He's had some atrophy in that leg from  [place] disability rating is now 90%.  They are actively looking for a single story home.  Unfortunately, they did not get the bid from the weekend.     Labs looked good at PCP per pt and his wife.  He is pretty good about taking his meds on time.    History obtained from review of EMR, discussion with primary team, and interview with family, facility staff/caregiver and/or Mr. Wolbert.   I reviewed available labs, medications, imaging, studies and related documents from the EMR.  Records reviewed and summarized above.   ROS  General: NAD EYES: denies vision changes, reports dry eyes ENMT:  denies dysphagia Cardiovascular: denies chest pain, denies DOE Pulmonary: denies cough, denies increased SOB Abdomen: endorses good appetite, denies constipation though not as regular as he once was, endorses continence of bowel GU: denies dysuria, endorses some incontinence of urine, has urgency, uses condom cath at hs, tries to use scheduled toileting MSK:  denies increased weakness--has gained strength with therapy,  no falls reported, still struggles getting up steps and up from the floor--cannot do this without assistance so will not get down there when only Patrick Ross is there Skin: denies rashes or wounds Neurological: some chronic back and side pain, denies insomnia Psych: Endorses boredom--wishes he could do more by being more mobile Heme/lymph/immuno: denies bruises, abnormal bleeding  Physical Exam:   Today's Vitals   05/28/21 0908  BP: 118/78  Pulse: 90  SpO2: 97%    Current  and past weights:  227 lb on 04/03/21 was most recent I saw in epic Constitutional: NAD General:  obese  EYES: anicteric sclera, lids intact, no discharge. Wears glasses ENMT: hard of hearing, oral mucous membranes moist, dentition intact CV: S1S2, RRR, no LE edema Pulmonary: LCTA, no increased work of breathing, no cough, room air Abdomen: intake 100%, normo-active BS + 4 quadrants, soft and non tender, no ascites GU: deferred MSK: no sarcopenia, moves all extremities, ambulatory with his rollator walker or standard walker  Skin: warm and dry, no rashes or wounds on visible skin Neuro:  some generalized weakness,  mild cognitive impairment, rigidity of extremities and resting tremor come and go with med timing Psych: non-anxious affect, A and O x 3, masked facies Hem/lymph/immuno: no widespread bruising  CURRENT PROBLEM LIST:  Patient Active Problem List   Diagnosis Date Noted   Urge incontinence 01/15/2021   History of UTI 01/15/2021   Abnormal urine findings 01/15/2021   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   History of nephrolithiasis 01/15/2021   DVT, lower extremity, proximal, acute, right ([place]) 02/26/2020   Open scalp wound 01/22/2020   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[place])    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   Parkinson's disease ([place]) 01/16/2018   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/16/2018   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   PAST MEDICAL HISTORY:  Active Ambulatory Problems    Diagnosis Date Noted   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   Parkinson's disease ([place]) 01/16/2018   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[place]) 01/16/2018   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   Open scalp wound 01/22/2020   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[place])    DVT, lower extremity, proximal, acute, right ([place]) 02/26/2020   Urge incontinence 01/15/2021   History of UTI 01/15/2021   Abnormal urine findings 01/15/2021   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   History of nephrolithiasis 01/15/2021   Resolved Ambulatory Problems    Diagnosis Date Noted   No Resolved Ambulatory Problems   Past Medical History:  Diagnosis Date   Depression    DVT (deep venous thrombosis) ([facility])    GERD (gastroesophageal reflux disease)    History of kidney stones    Hypertension    Hypothyroidism    Lumbar stenosis    OSA (obstructive sleep apnea)    PONV (postoperative nausea and vomiting)    REM behavioral disorder    Urinary incontinence    SOCIAL HX:  Social History   Tobacco Use   Smoking status: Never   Smokeless tobacco: Never  Substance Use Topics   Alcohol use: Not Currently     ALLERGIES:  Allergies  Allergen Reactions   Other Other (See Comments)    Surgical spray prior to adhesive causes blisters   Sulfamethoxazole Other (See Comments)    Unsure--childhood allergy     PERTINENT MEDICATIONS:  Outpatient Encounter Medications as of 05/28/2021  Medication Sig   amantadine (SYMMETREL) 100 MG capsule TAKE 1 CAPSULE TWICE A DAY   AMBULATORY NON FORMULARY MEDICATION Lift chair Dx:  G20 (Patient  taking differently: Lift chair Dx:  G20)   carbidopa-levodopa (SINEMET IR) 25-100 MG tablet 2 at 7am, 2 at 9:30am, 2 at noon, 2 at 2:30 pm, 2 at 5pm.  If you need an extra late evening, you may take 1 extra   clonazePAM (KLONOPIN) 0.5 MG tablet 1.5 tabs at bed   escitalopram (LEXAPRO) 20 MG tablet TAKE 1 TABLET DAILY   famotidine (PEPCID) 20 MG tablet Take 20 mg by mouth 2 (two) times daily.  Levodopa (INBRIJA) 42 MG CAPS ORALLY INHALE CONTENTS OF 2 CAPSULES AS NEEDED FOR SYMPTOMS OF AN OFF PERIOD (DO NOT EXCEED 5 DOSES IN 1 DAY)   levothyroxine (SYNTHROID) 125 MCG tablet Take 125 mcg by mouth daily.   lisinopril (PRINIVIL,ZESTRIL) 20 MG tablet Take 20 mg by mouth daily.   mirabegron ER (MYRBETRIQ) 25 MG TB24 tablet Take 25 mg by mouth at bedtime.   nitrofurantoin, macrocrystal-monohydrate, (MACROBID) 100 MG capsule Take 1 capsule (100 mg total) by mouth daily. Begin after completing Cefdinir   OVER THE COUNTER MEDICATION Take 2 tablets by mouth 2 (two) times daily. Alpha CRS otc supplement   OVER THE COUNTER MEDICATION Take 2 capsules by mouth 2 (two) times daily. Microplex VMz otc supplement   OVER THE COUNTER MEDICATION Take 2 capsules by mouth 2 (two) times daily. XEOmega 3 oil complex   OVER THE COUNTER MEDICATION Take 1 tablet by mouth daily. onguard+ otc supplement   OVER THE COUNTER MEDICATION Take 3 capsules by mouth at bedtime. terrazyme supplement   OVER THE COUNTER MEDICATION Take 1 tablet by mouth 2 (two) times daily. Bone nutrient otc supplement   OVER THE COUNTER MEDICATION Take 1 capsule by mouth at bedtime. PB Assist pro/prebiotic   rosuvastatin (CRESTOR) 20 MG tablet Take 20 mg by mouth daily.   No facility-administered encounter medications on file as of 05/28/2021.   Thank you for the opportunity to participate in the care of Mr. Chestnut.  The palliative care team will continue to follow. Please call our office at [phone] if we can be of additional assistance.   Hollace Kinnier, DO  COVID-19 PATIENT SCREENING TOOL Asked and negative response unless otherwise noted:  Have you had symptoms of covid, tested positive or been in contact with someone with symptoms/positive test in the past 5-10 days? no

## 2021-05-30 MED ORDER — GEMTESA 75 MG PO TABS: 1.0000 | ORAL_TABLET | Freq: Every day | ORAL | 0 refills | Status: DC

## 2021-06-04 ENCOUNTER — Other Ambulatory Visit: Payer: Self-pay | Admitting: Neurology

## 2021-06-04 DIAGNOSIS — F330 Major depressive disorder, recurrent, mild: Secondary | ICD-10-CM

## 2021-06-04 DIAGNOSIS — G20 Parkinson's disease: Secondary | ICD-10-CM

## 2021-06-08 ENCOUNTER — Telehealth: Payer: Self-pay | Admitting: Neurology

## 2021-06-08 NOTE — Telephone Encounter (Signed)
Called pharmacy and authorized refill ?

## 2021-06-08 NOTE — Telephone Encounter (Signed)
Patients wife called and stated that the pharmacy has a half a pill difference of the clonazepam.  She stated that its a day early and he does not have enough.  He uses [facility] in [place]. ?

## 2021-06-19 ENCOUNTER — Other Ambulatory Visit: Payer: Self-pay

## 2021-06-19 DIAGNOSIS — G20 Parkinson's disease: Secondary | ICD-10-CM

## 2021-06-19 MED ORDER — CARBIDOPA-LEVODOPA 25-100 MG PO TABS: ORAL_TABLET | ORAL | 0 refills | Status: DC

## 2021-06-22 ENCOUNTER — Ambulatory Visit: Payer: TRICARE For Life (TFL) | Admitting: Licensed Clinical Social Worker

## 2021-06-23 ENCOUNTER — Ambulatory Visit: Payer: TRICARE For Life (TFL) | Admitting: Licensed Clinical Social Worker

## 2021-07-02 ENCOUNTER — Ambulatory Visit: Payer: TRICARE For Life (TFL) | Admitting: Urology

## 2021-07-03 ENCOUNTER — Other Ambulatory Visit: Payer: Self-pay | Admitting: Neurology

## 2021-07-03 DIAGNOSIS — G20 Parkinson's disease: Secondary | ICD-10-CM

## 2021-07-03 DIAGNOSIS — F330 Major depressive disorder, recurrent, mild: Secondary | ICD-10-CM

## 2021-07-08 ENCOUNTER — Telehealth: Payer: Self-pay | Admitting: Neurology

## 2021-07-08 DIAGNOSIS — G20 Parkinson's disease: Secondary | ICD-10-CM

## 2021-07-08 MED ORDER — INBRIJA 42 MG IN CAPS: ORAL_CAPSULE | RESPIRATORY_TRACT | 0 refills | Status: DC

## 2021-07-08 NOTE — Telephone Encounter (Signed)
Patient wife called and states that patient has appt with Tat on Monday and they have enough of the inbrija medication to last until then.  ? ?She states that they have had a hard time getting a refill on the medication the pharmacy states that they have not heard anything from our office. She was not sure if Dr Tat wanted to wait until she see him on Monday 07-13-21 to see if the dosage needed to be changed or if not could she please call it in for the [facility]  ? ?Please call patient wife and let her know if Tat wants to see patient first before the RX is called in  ?

## 2021-07-08 NOTE — Progress Notes (Signed)
? ? ?Assessment/Plan:  ? ?1.  Parkinsons Disease ?            -The patient underwent right STN DBS on December 10, 2011 and a left STN DBS on December 27, 2012.  Postoperatively, his left STN DBS lead was found to be displaced and had revision of the left lead on February 02, 2013 (pulled back 1 cm without MER).   IPG on the RIGHT changed on 01/23/19.  IPG on the left last changed on 02/02/18 ?            -Patient underwent lead revision on January 16, 2019 on the left, as the lead was still displaced/deep.  It was pulled back 7.5 mm.  Postoperative imaging indicated that it was still 5 mm too deep, so when the battery was changed to the following week, it was pulled back another 5 mm.  Postoperative imaging indicates that the leads were in good place, but the patient hit his head on the corner of a wall and developed an abrasion over the lead, which ultimately exposed to lead.  This led to a wound infection, requiring that the entire system on the left be removed on January 22, 2020.  We have opted not to replace this lead, as the patient has really clinically done pretty well, but we also worry about skin integrity on the scalp. ? -Continue carbidopa/levodopa 25/100, 2 at 7am, 2 at 9:30am, 2 at noon, 2 at 2:30 pm, 2 at 5pm.  Discussed with him that he really needs to take an extra half tablet to 1 tablet as needed for off episodes or for dose failure. ? -trial entacapone 200 mg tid with every other dose of levodopa.  He has been on this in the form of Stalevo in the past with success.  We took him off of it because of dyskinesia post surgery and it was easier to control on regular release levodopa.  He would like to go back on it and see how he does.  Discussed risks for confusion/hallucinations/diarrhea and discussed it would change the urine color to orange. ? -Continue amantadine, 100 mg twice per day ? -Continue Inbrija as needed, up to 5 times per day, separated by 2-hour intervals.  Told him that he may need  to schedule this for first morning on and perhaps 2 other times during the day. ? -discussed electric WC  ? ? ? ?  ?2.  RBD ?            -Continue clonazepam, 0.5 mg, 1.5 tablets at night.   ?  ?3.  Urinary incontinence ?            -referred to urology to Dr. Felipa Eth.  We made appt while in the office.  Patient was a patient of Dr. Carlyle Lipa at his previous office. ?  ?4.  Low back pain ?            -Follows with Dr. Letta Pate as well as [facility] neurosurgery.  He is having more difficulties with his back since his DBS was removed (not able to move nearly as well).  I think that the right leg pain may be from this as opposed to Parkinson's disease.  He was told to try an extra levodopa when he has it, and if it does not work, he needs to get back to see Dr. Letta Pate.  He previously saw Dr. Prince Rome as well at [facility], who has since retired. ? ?5.  Depression ? -  Continue Lexapro, 20 mg daily.  Refilled today. ? -Declined psychiatry.  Has been participating with counseling. ? ?6.  Sleep apnea ? -Following with neurology in [facility] (sees the PA).  On CPAP. ? ? ?Subjective:  ? ?Patrick Ross was seen today.  Patient accompanied by wife who supplements history.  Patient has been exercising.  He has been participating with counseling with LCSW.  He is having a lot of freezing episodes but not falling with any of them.  Balance is "off."  No hallucinations.   ? ?Current prescribed movement disorder medications: ?carbidopa/levodopa 25/100, 2 at 7am, 2 at 9:30am, 2 at noon, 2 at 2:30 pm, 2 at 5pm and an extra as needed (they take a 1/2 toward bedtime) ?Clonazepam 0.5 mg, 1-1/2 tablets at bedtime ?Amantadine, 100 mg twice per day  ?Inbrija (using bid - tid) ?Lexapro, 20 mg daily  ? ?PREVIOUS MEDICATIONS: amantadine; klonopin; carbidopa/levodopa 25/100; carbidopa/levodopa 50/200; stalevo; Osmolex -had dizziness with higher dosages and unable to afford the $60 per month co-pay for the lower  dose. ? ? ?ALLERGIES:   ?Allergies  ?Allergen Reactions  ? Other Other (See Comments)  ?  Surgical spray prior to adhesive causes blisters  ? Sulfamethoxazole Other (See Comments)  ?  Unsure--childhood allergy  ? ? ?CURRENT MEDICATIONS:  ?Outpatient Encounter Medications as of 07/13/2021  ?Medication Sig  ? amantadine (SYMMETREL) 100 MG capsule TAKE 1 CAPSULE TWICE A DAY  ? AMBULATORY NON FORMULARY MEDICATION Lift chair ?Dx:  G20 (Patient taking differently: Lift chair ?Dx:  G20)  ? carbidopa-levodopa (SINEMET IR) 25-100 MG tablet 2 at 7am, 2 at 9:30am, 2 at noon, 2 at 2:30 pm, 2 at 5pm.  If you need an extra late evening, you may take 1 extra  ? clonazePAM (KLONOPIN) 0.5 MG tablet 1.5 tabs at bed  ? cycloSPORINE (RESTASIS) 0.05 % ophthalmic emulsion Place 1 drop into both eyes 2 times daily.  ? escitalopram (LEXAPRO) 20 MG tablet TAKE 1 TABLET DAILY  ? famotidine (PEPCID) 20 MG tablet Take 20 mg by mouth 2 (two) times daily.  ? Levodopa (INBRIJA) 42 MG CAPS ORALLY INHALE CONTENTS OF 2 CAPSULES AS NEEDED FOR SYMPTOMS OF AN OFF PERIOD (DO NOT EXCEED 5 DOSES IN 1 DAY)  ? levothyroxine (SYNTHROID) 125 MCG tablet Take 125 mcg by mouth daily.  ? lisinopril (PRINIVIL,ZESTRIL) 20 MG tablet Take 20 mg by mouth daily.  ? lisinopril (ZESTRIL) 40 MG tablet Take 1 tablet by mouth daily.  ? loteprednol (LOTEMAX) 0.5 % ophthalmic suspension Place 1 drop into both eyes 3 times daily.  ? Mirabegron (MYRBETRIQ PO) Take by mouth.  ? nitrofurantoin, macrocrystal-monohydrate, (MACROBID) 100 MG capsule Take 1 capsule (100 mg total) by mouth daily. Begin after completing Cefdinir  ? OVER THE COUNTER MEDICATION Take 2 tablets by mouth 2 (two) times daily. Alpha CRS otc supplement  ? OVER THE COUNTER MEDICATION Take 2 capsules by mouth 2 (two) times daily. Microplex VMz otc supplement  ? OVER THE COUNTER MEDICATION Take 2 capsules by mouth 2 (two) times daily. XEOmega 3 oil complex  ? OVER THE COUNTER MEDICATION Take 1 tablet by mouth  daily. onguard+ otc supplement  ? OVER THE COUNTER MEDICATION Take 3 capsules by mouth at bedtime. terrazyme supplement  ? OVER THE COUNTER MEDICATION Take 1 tablet by mouth 2 (two) times daily. Bone nutrient otc supplement  ? OVER THE COUNTER MEDICATION Take 1 capsule by mouth at bedtime. PB Assist pro/prebiotic  ? Propylene Glycol 0.6 %  SOLN Place 1 drop into both eyes 2 times daily.  ? rosuvastatin (CRESTOR) 20 MG tablet Take 20 mg by mouth daily.  ? Vibegron (GEMTESA) 75 MG TABS Take 1 tablet by mouth daily. (Patient not taking: Reported on 07/13/2021)  ? [DISCONTINUED] Levodopa (INBRIJA) 42 MG CAPS ORALLY INHALE CONTENTS OF 2 CAPSULES AS NEEDED FOR SYMPTOMS OF AN OFF PERIOD (DO NOT EXCEED 5 DOSES IN 1 DAY)  ? ?No facility-administered encounter medications on file as of 07/13/2021.  ? ? ?Objective:  ? ?PHYSICAL EXAMINATION:   ? ?VITALS:   ?Vitals:  ? 07/13/21 1415  ?BP: 111/69  ?Pulse: 88  ?SpO2: 95%  ?Weight: 228 lb (103.4 kg)  ?Height: '6\' 2"'$  (1.88 m)  ? ? ? ? ? ?GEN:  The patient appears stated age and is in NAD. ?HEENT:  Normocephalic, atraumatic. ? ?Neurological examination: ? ?Orientation: The patient is alert and oriented x3. ?Cranial nerves: There is good facial symmetry with facial hypomimia, lips parted. The speech is fluent and clear.  He is much less dysarthric than previous. Soft palate rises symmetrically and there is no tongue deviation. Hearing is intact to conversational tone. ?Sensation: Sensation is intact to light touch throughout ?Motor: Strength is at least antigravity x4. ? ?Movement examination: ?Tone: There is normal tone in the UE/LE ?Abnormal movements: none ?Coordination:  There is mild decremation on the R, with any form of RAMS, including alternating supination and pronation of the forearm, hand opening and closing, finger taps, heel taps and toe taps. ?Gait and Station: Patient pushes off of the chair to arise. Walks well in the hall. Min freezing with R leg in the turn in the  doorway ? ?I have reviewed and interpreted the following labs independently ?  Chemistry   ?   ?Component Value Date/Time  ? NA 140 02/15/2020 1733  ? K 3.7 02/15/2020 1733  ? CL 103 02/15/2020 1733  ? CO2 23 02/15/2020 1733  ? B

## 2021-07-08 NOTE — Telephone Encounter (Signed)
Called patients wife and informed her that [facility]. Patients wife verbalized understanding and had no further questions or concerns.  ?

## 2021-07-10 ENCOUNTER — Other Ambulatory Visit: Payer: Self-pay

## 2021-07-10 DIAGNOSIS — G20 Parkinson's disease: Secondary | ICD-10-CM

## 2021-07-10 MED ORDER — INBRIJA 42 MG IN CAPS: ORAL_CAPSULE | RESPIRATORY_TRACT | 1 refills | Status: DC

## 2021-07-13 ENCOUNTER — Ambulatory Visit (INDEPENDENT_AMBULATORY_CARE_PROVIDER_SITE_OTHER): Payer: Medicare Other | Admitting: Neurology

## 2021-07-13 ENCOUNTER — Encounter: Payer: Self-pay | Admitting: Neurology

## 2021-07-13 DIAGNOSIS — G20A1 Parkinson's disease without dyskinesia, without mention of fluctuations: Secondary | ICD-10-CM

## 2021-07-13 DIAGNOSIS — F330 Major depressive disorder, recurrent, mild: Secondary | ICD-10-CM

## 2021-07-13 DIAGNOSIS — G20 Parkinson's disease: Secondary | ICD-10-CM

## 2021-07-13 MED ORDER — ESCITALOPRAM OXALATE 20 MG PO TABS: 20.0000 mg | ORAL_TABLET | Freq: Every day | ORAL | 2 refills | Status: DC

## 2021-07-13 MED ORDER — AMANTADINE HCL 100 MG PO CAPS: 100.0000 mg | ORAL_CAPSULE | Freq: Two times a day (BID) | ORAL | 1 refills | Status: DC

## 2021-07-13 MED ORDER — ENTACAPONE 200 MG PO TABS: ORAL_TABLET | ORAL | 1 refills | Status: DC

## 2021-07-13 NOTE — Procedures (Signed)
DBS Programming was performed.   ? ?Manufacturer of DBS device: [employer]tronic ? ?Total time spent programming was 10  minutes.  Device was confirmed to be on.  Soft start was confirmed to be on.  Impedences were checked and were within normal limits.  Battery was checked and was determined to be functioning normally and not near the end of life.  Final settings were as follows with group A active: ? ? Active Contact Amplitude (V) PW (ms) Frequency (hz) Side Effects Battery  ?Left Brain        ?Group A        ?12/20/19 3-C+ 3.1 70 140  2.89  ?01/29/20 N/A       ?03/03/20 N/A       ?05/19/20 n/a       ?09/16/20 N/a       ?        ?        ?Group B        ?12/20/19 2-C+ 2.0 70 130    ?        ?Right Brain        ?Group A        ?12/20/19 3-C+ 3.2 90 135  2.94  ?01/29/20 3-C+ 3.2 (2.4-3.6) 90 135  2.94  ?03/03/20 3-C+ 3.2 90 135  2.94  ?05/19/20 3-C+ 3.2 (2.4-3.6) 90 135  2.92  ?09/16/20 3-C+ 3.2 90 135  2.90  ?01/12/21 3-C+ 3.25 90 135  2.88  ?07/13/21 3-C+ 3.25 90 135  2.82  ?        ?        ? ? ?

## 2021-07-13 NOTE — Patient Instructions (Addendum)
Add entacapone (comtan) to the 7am/noon/5pm dosages of carbidopa/levodopa.  This may change your urine color to orange.  Let me know if it causes confusion/diarrhea. ? ?Local and Online Resources for Power over Parkinson's Group ?May 2023 ? ?LOCAL [place] PARKINSON'S GROUPS  ?Power over Parkinson's Group:   ?Power Over Parkinson's Patient Education Group will be Wednesday, May 10th-*Hybrid meting*- in person at [facility] location and via [facility] at 2:00 pm.   ?Upcoming Power over Parkinson's Meetings:  2nd Wednesdays of the month at 2 pm:  May 10th, June 14th, July 12th ?Contact Amy Marriott at [email or web address] if interested in participating in this group ?Parkinson's Care Partners Group:    3rd Mondays, Contact Misty Paladino ?Atypical Parkinsonian Patient Group:   4th Wednesdays, Contact Misty Paladino ?If you are interested in participating in these groups with Misty, please contact her directly for how to join those meetings.  Her contact information is [email or web address].   ? ?LOCAL EVENTS AND NEW OFFERINGS ?Moving Day [place]:  Saturday, May 6th, 9:30 am at [place], [place], [place]. Participate in Moving Day as a way to ?honor loved ones, raise funds, fight Parkinson's disease, and celebrate movement.?  Register today at www.[email or web address] ?[place]!  Play [place]!  Join [place] for home game for a fun evening to bring awareness of Parkinson's and raise funds for our Movement Disorder Funds. Rescheduled to May 11th  6:30 pm [place]. To purchase tickets:  [email or web address] ?Parkinson's T-shirts for sale!  Designed by a local group member, with funds going to [place].  $25.00  Contact Misty to purchase  ?New PWR! Moves Dynegy Instructor-Led Class offering at [employer]!  Wednesdays 1-2 pm, starting April 12th.   Contact Bryson Dames, Chiropodist at [employer].  [email or web address] [email or web address] ? ?ONLINE EDUCATION AND SUPPORT ?[place]:  www.[email or web address] ?PD Health at Home continues:  Mindfulness Mondays, Wellness Wednesdays, Fitness Fridays  ?Upcoming Education:  ?Understanding Gene and Cell-Based Therapies in Parkinson's.  Wednesday, May 10th at 1:00 pm ?Additional Education offerings virtually through their website-upcoming topics include Palliative Care/Hospice and PD, Sleep and PD ?Register for expert briefings Cytogeneticist) at [email or web address].si ?Please check out their website to sign up for emails and see their full online offerings ? ? ?[place]:  www.[email or web address]  ?Third Thursday Webinars:  On the third Thursday of every month at 12 p.m. ET, join our free live webinars to learn about various aspects of living with Parkinson's disease and our work to speed medical breakthroughs. ?Upcoming Webinar: Get Moving: Exercising for a Healthy Brain.  Thursday, May 18th  at  12 noon. ?Check out additional information on their website to see their full online offerings ? ?[place]:  www.[email or web address] ?Upcoming Webinar:   Stay tuned ?Webinar Series:  Living with Parkinson's Meetup.   Third Thursdays each month, 3 pm ?Care Partner Monthly Meetup.  With Robin Searing Phinney.  First Tuesday of each month, 2 pm ?Check out additional information to Live Well Today on their website ? ?Parkinson and Movement Disorders (PMD) Alliance:  www.pmdalliance.org ?[employer] Online:  Online Education Events ?Sign up for emails, which are sent weekly to give you updates on programming and online offerings ? ?Parkinson's Association of [place]:  www.parkinsonassociation.org ?Information on online support groups, education events, and online exercises including Yoga, Parkinson's exercises and more-LOTS of information on links to PD resources and  online events ?Virtual Support Group through [employer] of the [place]; next one is scheduled for  Wednesday, May 3rd at 2 pm. (These are typically scheduled for the 1st Wednesday of the month at 2 pm).  Visit website for details. ?MOVEMENT AND EXERCISE OPPORTUNITIES ?Parkinson's DRUMMING Classes/Music Therapy with Doylene Canning:  This is a returning class and it's FREE!  2nd Mondays, continuing May 8th, 11:00 at the [place].  Contact *Misty Taylor-Paladino at [email or web address] or Doylene Canning at [phone] or [email or web address]  ?PWR! Moves Classes at [place].  Wednesdays 10 and 11 am.   Contact Amy Marriott, PT [email or web address] if interested. ?NEW PWR! Moves Class offering at [employer].  Wednesdays 1-2 pm, starting April 12th.  Contact Bryson Dames, Acupuncturist at [employer].  [email or web address] [email or web address] ?Here is a link to the PWR!Moves classes on Zoom from [place] - Daily Mon-Sat at 10:00. Via Zoom, FREE and open to all.  There is also a link below via Facebook if you use that platform. ? ?[email or web address].si ?https://www.[email or web address].no ? ?Parkinson's Wellness Recovery (PWR! Moves)  www.pwr4life.org ?Info on the PWR! Virtual Experience:  You will have access to our expertise through self-assessment, guided plans that start with the PD-specific fundamentals, educational content, tips, Q&A with an expert, and a growing Art therapist of PD-specific pre-recorded and live exercise classes of varying types and intensity - both physical and cognitive! If that is not enough, we offer 1:1 wellness consultations (in-person or virtual) to personalize your PWR! Research scientist (medical).  ?[employer]  Fridays:  ?As part of the PD Health @ Home program, this free video series focuses each week on one aspect of fitness designed to support people living with Parkinson's.  These weekly videos highlight the [place] recent fitness guidelines for people with Parkinson's disease. ?www.KVTVnet.com.cy ?Dance for PD website is offering free, live-stream classes throughout the week, as well as links to [employer] of classes:  https://danceforparkinsons.org/ ?Virtual dance and Pilates for Parkinson's classes: Click on the Community Tab> Parkinson's Movement Initiative Tab.  To register for classes and for more information, visit www.SeekAlumni.co.za and click the ?community? tab.  ?[employer] Parkinson's Cycling Classes  ?[employer]:  Thursdays @ Noon-Live classes at [employer] ([employer] at [email or web address] or [phone]) ?[employer] [employer] [employer]: Virtual Classes Mondays and Thursdays Jeanette Caprice classes Tuesday, Wednesday and Thursday (contact [place] at [email or web address]  or [phone]) ?[employer] ?Varied levels of classes are offered Mondays, Tuesdays and Thursdays at [employer].  ?Stretching with Verdis Frederickson weekly class is also offered for people with Parkinson's ?To observe a class or for more information, call [phone] or email Hezzie Bump at [email or web address] .[email or web address] ?ADDITIONAL SUPPORT AND RESOURCES ?[employer]:Online Caregiver Education Opportunities:  www.well-springsolutions.org/caregiver-education/caregiver-support-group.  You may also contact Vickki Muff at [email or web address] or [phone].    ?Well-Spring Navigator:  Just1Navigator program, a free service to help individuals and families through the journey of determining care for older adults.  The ?Navigator? is a [phone], Education officer, museum, who will speak with a prospective client and/or loved ones to  provide an assessment of the situation and a set of recommendations for a personalized care plan -- all free of charge, and whether Well[employer]Spring [employer] offers the needed service or not. If the need is

## 2021-07-16 ENCOUNTER — Encounter: Payer: Self-pay | Admitting: Internal Medicine

## 2021-07-16 ENCOUNTER — Other Ambulatory Visit: Payer: Medicare Other | Admitting: Internal Medicine

## 2021-07-16 VITALS — Wt 228.0 lb

## 2021-07-16 DIAGNOSIS — G20 Parkinson's disease: Secondary | ICD-10-CM

## 2021-07-16 DIAGNOSIS — Z515 Encounter for palliative care: Secondary | ICD-10-CM

## 2021-07-16 DIAGNOSIS — H04123 Dry eye syndrome of bilateral lacrimal glands: Secondary | ICD-10-CM

## 2021-07-16 DIAGNOSIS — G20A1 Parkinson's disease without dyskinesia, without mention of fluctuations: Secondary | ICD-10-CM

## 2021-07-16 DIAGNOSIS — N3941 Urge incontinence: Secondary | ICD-10-CM

## 2021-07-16 NOTE — Progress Notes (Signed)
[facility] Palliative Care Follow-Up Visit Telephone: [phone]  Fax: [phone]   Date of encounter: 07/16/21 7:54 AM PATIENT NAME: Patrick Ross [address] [address]   [phone] (home) [phone] (work) DOB: 05-13-1945 MRN: [id number] PRIMARY CARE PROVIDER:    Jefm Petty, MD,  [address] [place] [address] [phone]  REFERRING PROVIDER:   Jefm Petty, MD [address] [place],  [place] [address] [phone]  RESPONSIBLE PARTY:    Contact Information     Name Relation Home Work Mobile   Start[place] Spouse [phone]  [phone]        I met face to face with patient and Patrick Ross, Patrick Ross, in Patrick home. Palliative Care was asked to follow this patient by consultation request of  Jefm Petty, MD to address advance care planning and complex medical decision making. This is follow-up visit.                                     ASSESSMENT AND PLAN / RECOMMENDATIONS:   Advance Care Planning/Goals of Care: Goals include to maximize quality of life and symptom management. Patient/health care surrogate gave Patrick/her permission to discuss.Our advance care planning conversation included a discussion about:    The value and importance of advance care planning  Experiences with loved ones who have been seriously ill or have died  Exploration of personal, cultural or spiritual beliefs that might influence medical decisions  Exploration of goals of care in the event of a sudden injury or illness  Identification  of a healthcare agent  Review and updating or creation of an  advance directive document . Decision not to resuscitate or to de-escalate disease focused treatments due to poor prognosis. CODE STATUS:  MOST on file from Dec '22 for full code, full scope, abx if indicated, IVF if indicated and trial of tube feeding  Symptom Management/Plan: 1. Parkinson's disease  ([place]) -continue current regimen as revised by Dr. Carles Collet recently--he has not yet received the entacapone to take, monitor carefully for side effects -cont rollator walker use -they had more questions about power chairs and we talked about them for a while and benefits of planning ahead with such things -having more challenges with left leg "drawing" and decreased coordination/function of it when walking though it's the opposite side from where the DBS lead was removed; also note increased slurring speech/dysarthria  2. Urge incontinence -continue myrbetriq, q2h urinating in day, condom cath at hs, also remains on abx for prophylaxis per community urology though Dr [facility] urology recommends d/c -encouraged adherence to Patrick toileting regimen and hydration to prevent orthostasis, utis and constipation  3. Dry eye syndrome of both eyes -cont warm compresses, dry eye drops and f/u with ophtho as planned  4. Palliative care encounter -goals remain to stay in Patrick home--they continue to search for a single story home or possibly one with a first floor master -MOST previously done -remains fairly active and independent    Follow up Palliative Care Visit: Palliative care will continue to follow for complex medical decision making, advance care planning, and clarification of goals. Return 10/22/2021 and prn.  This visit was coded based on medical decision making (MDM).  PPS: 50%  HOSPICE ELIGIBILITY/DIAGNOSIS: TBD  Chief Complaint: Follow-up palliative visit  HISTORY OF PRESENT ILLNESS:  FODAY CONE is a 76 y.o. year old male  with Parkinson's disease with right-sided DBS in place, left previously removed, constipation, depression, urge urinary incontinence and chronic low back pain among others seen in palliative care follow-up   Since last visit, he's been to [facility] urology where no changes were made to Patrick regimen and he follows up in 6 mos.  He also saw Patrick neurologist, Dr. Carles Collet, for stimulator  programming who did recommend he take an extra 1/2 to one sinemet if meds fail him, also trial entacapone though warned of side effects including diarrhea, hallucinations, confusion and it will turn Patrick urine orange.  Also discussed power chair.  Also said that he could take Patrick imbija scheduled in am and two other times each day.    He saw Patrick optometrist at the [facility] for new glasses and also was noted to have OU cataract and presbyopia in addition to Patrick dry eyes.      History obtained from review of EMR, discussion with primary team, and interview with family, facility staff/caregiver and/or Patrick Ross.  I reviewed available labs, medications, imaging, studies and related documents from the EMR.  Records reviewed and summarized above.   ROS   General: NAD EYES: denies vision changes ENMT: denies dysphagia Cardiovascular: denies chest pain, denies DOE Pulmonary: denies cough, denies increased SOB Abdomen: endorses good appetite, gets constipation but not adherent with water intake and mobility, endorses no incontinence of bowel, has urgency of urine and not adherent with q2 h trips to restroom, uses condom cath at hs GU: denies dysuria, endorses  some incontinence of urine MSK:  denies increased weakness,  no falls reported, admits to more challenges moving left leg as walks Skin: denies rashes or wound Neurological: denies pain, denies insomnia Psych: Endorses positive mood Heme/lymph/immuno: denies bruises, abnormal bleeding  Physical Exam: Current and past weights:  228 lbs with bmi 29 Constitutional: NAD General: obese, masked facies much of time  EYES: anicteric sclera, lids intact, no discharge  ENMT: intact hearing, oral mucous membranes moist, dentition intact CV: S1S2, RRR, no LE edema Pulmonary: LCTA, no increased work of breathing, no cough, room air Abdomen: intake 100%, normo-active BS + 4 quadrants, soft and non tender, no ascites GU: deferred MSK: left leg atrophy,  moves all extremities, ambulatory with rollator walker Skin: warm and dry, no rashes or wounds on visible skin Neuro:  generalized weakness, mild cognitive impairment Psych: non-anxious affect, A and O x 3 Hem/lymph/immuno: no widespread bruising  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   History of nephrolithiasis 01/15/2021   DVT, lower extremity, proximal, acute, right ([place]) 02/26/2020   Open scalp wound 01/22/2020   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[place])    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   Parkinson's disease ([place]) 01/16/2018   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   PAST MEDICAL HISTORY:  Active Ambulatory Problems    Diagnosis Date Noted   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   Parkinson's disease ([place]) 01/16/2018   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   Open scalp wound 01/22/2020   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[place])    DVT, lower extremity, proximal, acute, right ([place]) 02/26/2020   Urge incontinence 01/15/2021   History of UTI 01/15/2021   Abnormal urine findings 01/15/2021   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   History of  nephrolithiasis 01/15/2021   Resolved Ambulatory Problems    Diagnosis Date Noted   No Resolved Ambulatory Problems   Past Medical History:  Diagnosis Date   Depression    DVT (deep venous thrombosis) (HCC)    GERD (gastroesophageal reflux disease)    History of kidney stones    Hypertension    Hypothyroidism    Lumbar stenosis    OSA (obstructive sleep apnea)    PONV (postoperative nausea and vomiting)    REM behavioral disorder    Urinary incontinence    SOCIAL HX:  Social History   Tobacco Use   Smoking status: Never   Smokeless tobacco: Never  Substance Use Topics   Alcohol use:  Not Currently     ALLERGIES:  Allergies  Allergen Reactions   Other Other (See Comments)    Surgical spray prior to adhesive causes blisters   Sulfamethoxazole Other (See Comments)    Unsure--childhood allergy     PERTINENT MEDICATIONS:  Outpatient Encounter Medications as of 07/16/2021  Medication Sig   amantadine (SYMMETREL) 100 MG capsule Take 1 capsule (100 mg total) by mouth 2 (two) times daily.   AMBULATORY NON FORMULARY MEDICATION Lift chair Dx:  G20 (Patient taking differently: Lift chair Dx:  G20)   carbidopa-levodopa (SINEMET IR) 25-100 MG tablet 2 at 7am, 2 at 9:30am, 2 at noon, 2 at 2:30 pm, 2 at 5pm.  If you need an extra late evening, you may take 1 extra   clonazePAM (KLONOPIN) 0.5 MG tablet 1.5 tabs at bed   cycloSPORINE (RESTASIS) 0.05 % ophthalmic emulsion Place 1 drop into both eyes 2 times daily.   entacapone (COMTAN) 200 MG tablet 1 with 7am/noon/5pm dosages of levodopa   escitalopram (LEXAPRO) 20 MG tablet Take 1 tablet (20 mg total) by mouth daily.   famotidine (PEPCID) 20 MG tablet Take 20 mg by mouth 2 (two) times daily.   Levodopa (INBRIJA) 42 MG CAPS ORALLY INHALE CONTENTS OF 2 CAPSULES AS NEEDED FOR SYMPTOMS OF AN OFF PERIOD (DO NOT EXCEED 5 DOSES IN 1 DAY)   levothyroxine (SYNTHROID) 125 MCG tablet Take 125 mcg by mouth daily.   lisinopril (PRINIVIL,ZESTRIL) 20 MG tablet Take 20 mg by mouth daily.   lisinopril (ZESTRIL) 40 MG tablet Take 1 tablet by mouth daily.   loteprednol (LOTEMAX) 0.5 % ophthalmic suspension Place 1 drop into both eyes 3 times daily.   Mirabegron (MYRBETRIQ PO) Take by mouth.   nitrofurantoin, macrocrystal-monohydrate, (MACROBID) 100 MG capsule Take 1 capsule (100 mg total) by mouth daily. Begin after completing Cefdinir   OVER THE COUNTER MEDICATION Take 2 tablets by mouth 2 (two) times daily. Alpha CRS otc supplement   OVER THE COUNTER MEDICATION Take 2 capsules by mouth 2 (two) times daily. Microplex VMz otc supplement   OVER THE  COUNTER MEDICATION Take 2 capsules by mouth 2 (two) times daily. XEOmega 3 oil complex   OVER THE COUNTER MEDICATION Take 1 tablet by mouth daily. onguard+ otc supplement   OVER THE COUNTER MEDICATION Take 3 capsules by mouth at bedtime. terrazyme supplement   OVER THE COUNTER MEDICATION Take 1 tablet by mouth 2 (two) times daily. Bone nutrient otc supplement   OVER THE COUNTER MEDICATION Take 1 capsule by mouth at bedtime. PB Assist pro/prebiotic   Propylene Glycol 0.6 % SOLN Place 1 drop into both eyes 2 times daily.   rosuvastatin (CRESTOR) 20 MG tablet Take 20 mg by mouth daily.   Vibegron (GEMTESA) 75 MG TABS Take 1  tablet by mouth daily. (Patient not taking: Reported on 07/13/2021)   No facility-administered encounter medications on file as of 07/16/2021.   Thank you for the opportunity to participate in the care of Patrick Ross.  The palliative care team will continue to follow. Please call our office at [phone] if we can be of additional assistance.   Hollace Kinnier, DO  COVID-19 PATIENT SCREENING TOOL Asked and negative response unless otherwise noted:  Have you had symptoms of covid, tested positive or been in contact with someone with symptoms/positive test in the past 5-10 days? no

## 2021-07-22 ENCOUNTER — Telehealth: Payer: Self-pay | Admitting: Neurology

## 2021-07-22 NOTE — Telephone Encounter (Signed)
Patients wife called to inform tat she did not give Darrie the medicine at 36 and will not. She will d/c.

## 2021-07-22 NOTE — Telephone Encounter (Signed)
Patient's wife called and left a message stating the patient started a new medication Comtan this past Monday and since has been having increased confusion and some other concerns. She'd like a call back.

## 2021-07-24 NOTE — Telephone Encounter (Signed)
Called Pt and reported answer to her per Dr. Carles Collet . She said he was sleeping a lot better now and not getting up at night but he is freezing up when walking and would like to know is there is any thing to help him.

## 2021-07-28 NOTE — Telephone Encounter (Signed)
Returned Pt call and informed wife of DR. Tats answer she understood.

## 2021-09-07 ENCOUNTER — Telehealth: Payer: Self-pay | Admitting: Neurology

## 2021-09-07 ENCOUNTER — Other Ambulatory Visit: Payer: Self-pay | Admitting: Neurology

## 2021-09-07 DIAGNOSIS — F330 Major depressive disorder, recurrent, mild: Secondary | ICD-10-CM

## 2021-09-07 NOTE — Telephone Encounter (Signed)
Patient's wife called and said the clonazepam was denied per the pharmacy and she was told to call the pharmacy to find out why.  Patient will be out of the medicine after tonight.  [facility] on [address]

## 2021-09-07 NOTE — Telephone Encounter (Signed)
Called patients wife back and told her Dr. Delice Lesch approved and sent this medication this morning to [facility] on [address] at 10:00 AM this morning

## 2021-09-17 ENCOUNTER — Telehealth: Payer: Self-pay | Admitting: Neurology

## 2021-09-17 NOTE — Telephone Encounter (Signed)
Patrick Ross called from [facility].  She wanted to confirm if Dr had signed a prescription for [facility] cpap machine.  Her callback is [phone].

## 2021-09-17 NOTE — Telephone Encounter (Signed)
Pt sees neurology in [place] for CPAP Paperwork was faxed back yesterday and we will refax today

## 2021-10-13 ENCOUNTER — Other Ambulatory Visit: Payer: Self-pay

## 2021-10-13 DIAGNOSIS — G20 Parkinson's disease: Secondary | ICD-10-CM

## 2021-10-13 DIAGNOSIS — G20A1 Parkinson's disease without dyskinesia, without mention of fluctuations: Secondary | ICD-10-CM

## 2021-10-13 MED ORDER — CARBIDOPA-LEVODOPA 25-100 MG PO TABS: ORAL_TABLET | ORAL | 0 refills | Status: DC

## 2021-10-22 ENCOUNTER — Encounter: Payer: Self-pay | Admitting: Internal Medicine

## 2021-10-22 ENCOUNTER — Other Ambulatory Visit: Payer: Medicare Other | Admitting: Internal Medicine

## 2021-10-22 VITALS — BP 122/70 | HR 82 | Temp 97.8°F | Resp 16 | Wt 232.0 lb

## 2021-10-22 DIAGNOSIS — G20A1 Parkinson's disease without dyskinesia, without mention of fluctuations: Secondary | ICD-10-CM

## 2021-10-22 DIAGNOSIS — K592 Neurogenic bowel, not elsewhere classified: Secondary | ICD-10-CM

## 2021-10-22 DIAGNOSIS — Z515 Encounter for palliative care: Secondary | ICD-10-CM

## 2021-10-22 DIAGNOSIS — H04123 Dry eye syndrome of bilateral lacrimal glands: Secondary | ICD-10-CM

## 2021-10-22 DIAGNOSIS — G20 Parkinson's disease: Secondary | ICD-10-CM

## 2021-10-22 DIAGNOSIS — N3941 Urge incontinence: Secondary | ICD-10-CM

## 2021-10-22 NOTE — Progress Notes (Signed)
[facility] Palliative Care Follow-Up Visit Telephone: [phone]  Fax: [phone]   Date of encounter: 10/22/21 9:21 AM PATIENT NAME: Patrick Ross [id number] [id number]   [phone] (home) [phone] (work) DOB: 1945-05-12 MRN: [id number] PRIMARY CARE PROVIDER:    Jefm Petty, MD,  [address] [place] [address] [phone]  REFERRING PROVIDER:   Jefm Petty, MD [address] [place],  [place] [address] [phone]  RESPONSIBLE PARTY:    Contact Information     Name Relation Home Work Mobile   [place] Spouse [phone]  [phone]        I met face to face with patient and family in his home with wife Jackelyn Poling. Palliative Care was asked to follow this patient by consultation request of  Jefm Petty, MD to address advance care planning and complex medical decision making. This is follow-up visit.                                     ASSESSMENT AND PLAN / RECOMMENDATIONS:   Advance Care Planning/Goals of Care: Goals include to maximize quality of life and symptom management. Patient/health care surrogate gave his/her permission to discuss.Our advance care planning conversation included a discussion about:    The value and importance of advance care planning  Experiences with loved ones who have been seriously ill or have died  Exploration of personal, cultural or spiritual beliefs that might influence medical decisions  Exploration of goals of care in the event of a sudden injury or illness  Identification  of a healthcare agent  Review and updating or creation of an  advance directive document . Decision not to resuscitate or to de-escalate disease focused treatments due to poor prognosis. CODE STATUS: full code, most on file, no changes requested  Symptom Management/Plan: 1. Parkinson's disease ([place]) -did not do well with comtan so stopped again -back on same  regimen as prior  -getting lift put in for stairs and home reno done through [place] to accommodate his needs better  2. Urge incontinence -somewhat improved, but still must go on a schedule and doesn't always comply with this  3. Dry eye syndrome of both eyes -continue warm compresses, drops and gel  4. 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 Palliative care encounter -goals unchanged at this time =cont to follow over time    Follow up Palliative Care Visit: Palliative care will continue to follow for complex medical decision making, advance care planning, and clarification of goals. Return 01/14/2022  weeks or prn.   This visit was coded based on medical decision making (MDM).  PPS: 50%  HOSPICE ELIGIBILITY/DIAGNOSIS: TBD  Chief Complaint: Follow-up palliative visit  HISTORY OF PRESENT ILLNESS:  Patrick Ross is a 76 y.o. year old male  with Parkinson's, urge incontinence, dry eyes, constipation among others seen for palliative f/u .   No falls.  Teaching heel, ball, toe so he's not leaning forward so much.  Also working on his [place] situation.  He is getting PT twice a week for his lower back on the right.  Has theracane.    His brother passed away and they traveled for the funeral. They had hoped to see him  before he died, but he passed away the night before they were intended to travel.  Their eldest daughter helped him travel.    Continues cycling two times a week for 35-45 mins.  Doing walking with aide other days.  In evenings, playing catch some.    Stays a little dizzy and lightheaded most of the time.  He mentions not driving due to this.  His depth perception is also poor.    Could not take comtan when tried again--caused major confusionso stopped.    He still struggles to roll over and get up  like is suggested.  Hurts.  Bladder is a little better in the daytime--still on myrbetriq and using condom cath at night.  Working on training him to put his catheter on himself. 4th grandbaby due end of dec and early jan and [name] wants to be able to be there for that.  Otherwise a respite stay would be needed.    Plans are in place to get chair lift.  If he goes upstairs after 8:30pm, it's a lot harder to get up the steps walking.  When he comes down, it's easier.  Working on a grant to widen the bedroom door.    Also getting a metal ramp to go out to the yard.    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  They're meeting with the new pastor.  Charlie orchestrates it.    Dry eyes remain an issue with burning and watering--has two different drops and warm compresses.  Has a cream at night in case eyes are slightly open.    Constipation:  has had 4 days b/w bms the past couple of weeks.  Reminded to drink more, move around and eat more veggies.   Derm visit yesterday went well for a year.  Prior basal cell.  History obtained from review of EMR, discussion with primary team, and interview with family, facility staff/caregiver and/or Mr. Blount.   I reviewed available labs, medications, imaging, studies and related documents from the EMR.  Records reviewed and summarized above.   ROS Review of Systems see hpi  Physical Exam: Vitals:   10/22/21 0915  BP: 122/70  Pulse: 82  Resp: 16  Temp: 97.8 F (36.6 C)  SpO2: 98%  Weight: 232 lb (105.2 kg)     Body mass index is 29.79 kg/m. Wt Readings from Last 500 Encounters:  10/22/21 232 lb (105.2 kg)  07/16/21 228 lb (103.4 kg)  07/13/21 228 lb (103.4 kg)  04/03/21 227 lb (103 kg)  02/25/21 227 lb (103 kg)  01/12/21 227 lb (103 kg)  09/16/20 227 lb (103 kg)  05/19/20 233 lb (105.7 kg)  03/14/20 230 lb (104.3 kg)  03/03/20 231 lb (104.8 kg)  02/26/20 230 lb (104.3 kg)  02/15/20 230  lb (104.3 kg)  01/29/20 230 lb (104.3 kg)  01/22/20 230 lb (104.3 kg)  12/20/19 226 lb (102.5 kg)  08/27/19 225 lb (102.1 kg)  05/01/19 214 lb (97.1 kg)  03/26/19 215 lb (97.5 kg)  01/29/19 209 lb 3.2 oz (94.9 kg)  01/23/19 207 lb 14.3 oz (94.3 kg)  01/16/19 208 lb (94.3 kg)  12/08/18 206 lb (93.4 kg)  11/29/18 208 lb (94.3 kg)  10/23/18 209 lb (94.8 kg)  08/14/18 211 lb 12.8 oz (96.1 kg)  06/28/18 217 lb (98.4 kg)  06/05/18 228 lb (103.4 kg)  05/16/18 216 lb (98 kg)  05/08/18 223 lb (101.2 kg)  03/21/18 227 lb 3.2 oz (103.1 kg)  02/20/18 222 lb (100.7 kg)  01/30/18 222 lb (100.7 kg)  01/16/18 222 lb (100.7 kg)  11/18/17 224 lb 6.4 oz (101.8 kg)  11/03/17 224 lb (101.6 kg)  09/20/17 224 lb (101.6 kg)  09/08/17 226 lb (102.5 kg)   Physical Exam HENT:     Head: Normocephalic and atraumatic.  Eyes:     Comments: glasses  Cardiovascular:     Rate and Rhythm: Normal rate and regular rhythm.  Pulmonary:     Effort: Pulmonary effort is normal.     Breath sounds: Normal breath sounds.  Abdominal:     General: Bowel sounds are normal.     Palpations: Abdomen is soft.  Musculoskeletal:        General: Normal range of motion.     Right lower leg: No edema.     Left lower leg: No edema.     Comments: Leans in chair; ambulates with rollator, still gets tenderness in flank  Neurological:     General: No focal deficit present.     Mental Status: He is alert and oriented to person, place, and time.     Gait: Gait abnormal.  Psychiatric:        Mood and Affect: Mood normal.     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    PAST MEDICAL HISTORY:  Active Ambulatory Problems    Diagnosis Date Noted   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   Parkinson's disease ([place]) 01/16/2018   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   Open scalp wound 01/22/2020   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[place])    DVT, lower extremity, proximal, acute, right ([place]) 02/26/2020   Urge incontinence 01/15/2021   History of UTI 01/15/2021   Abnormal urine findings 01/15/2021   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   History of nephrolithiasis 01/15/2021   Resolved Ambulatory Problems    Diagnosis Date Noted   No Resolved Ambulatory Problems   Past Medical History:  Diagnosis Date   Depression    DVT (deep venous thrombosis) (HCC)    GERD (gastroesophageal reflux disease)    History of kidney stones    Hypertension    Hypothyroidism    Lumbar stenosis    OSA (obstructive sleep apnea)    PONV (postoperative nausea and vomiting)    REM behavioral disorder    Urinary incontinence     SOCIAL HX:  Social History   Tobacco Use   Smoking status: Never   Smokeless tobacco: Never  Substance Use Topics   Alcohol use: Not Currently     ALLERGIES:  Allergies  Allergen Reactions   Other Other (See Comments)    Surgical spray prior to adhesive causes blisters   Sulfamethoxazole Other (See Comments)    Unsure--childhood allergy      PERTINENT MEDICATIONS:  Outpatient Encounter Medications as of 10/22/2021  Medication Sig   amantadine (SYMMETREL) 100 MG capsule Take 1 capsule (100 mg total) by mouth  2 (two) times daily.   AMBULATORY NON FORMULARY MEDICATION Lift chair Dx:  G20 (Patient taking differently: Lift chair Dx:  G20)   carbidopa-levodopa (SINEMET IR) 25-100 MG tablet 2 at 7am, 2 at 9:30am, 2 at noon, 2 at 2:30 pm, 2 at 5pm.  If you need  an extra late evening, you may take 1 extra   clonazePAM (KLONOPIN) 0.5 MG tablet TAKE 1 AND 1/2 TABLETS BY MOUTH AT BEDTIME   cycloSPORINE (RESTASIS) 0.05 % ophthalmic emulsion Place 1 drop into both eyes 2 times daily.   entacapone (COMTAN) 200 MG tablet 1 with 7am/noon/5pm dosages of levodopa   escitalopram (LEXAPRO) 20 MG tablet Take 1 tablet (20 mg total) by mouth daily.   famotidine (PEPCID) 20 MG tablet Take 20 mg by mouth 2 (two) times daily.   Levodopa (INBRIJA) 42 MG CAPS ORALLY INHALE CONTENTS OF 2 CAPSULES AS NEEDED FOR SYMPTOMS OF AN OFF PERIOD (DO NOT EXCEED 5 DOSES IN 1 DAY)   levothyroxine (SYNTHROID) 125 MCG tablet Take 125 mcg by mouth daily.   lisinopril (PRINIVIL,ZESTRIL) 20 MG tablet Take 20 mg by mouth daily.   lisinopril (ZESTRIL) 40 MG tablet Take 1 tablet by mouth daily.   loteprednol (LOTEMAX) 0.5 % ophthalmic suspension Place 1 drop into both eyes 3 times daily.   Mirabegron (MYRBETRIQ PO) Take by mouth.   nitrofurantoin, macrocrystal-monohydrate, (MACROBID) 100 MG capsule Take 1 capsule (100 mg total) by mouth daily. Begin after completing Cefdinir   OVER THE COUNTER MEDICATION Take 2 tablets by mouth 2 (two) times daily. Alpha CRS otc supplement   OVER THE COUNTER MEDICATION Take 2 capsules by mouth 2 (two) times daily. Microplex VMz otc supplement   OVER THE COUNTER MEDICATION Take 2 capsules by mouth 2 (two) times daily. XEOmega 3 oil complex   OVER THE COUNTER MEDICATION Take 1 tablet by mouth daily. onguard+ otc supplement   OVER THE COUNTER MEDICATION Take 3 capsules by mouth at bedtime. terrazyme supplement   OVER THE COUNTER MEDICATION Take 1 tablet by mouth 2 (two) times daily. Bone nutrient otc supplement   OVER THE COUNTER MEDICATION Take 1 capsule by mouth at bedtime. PB Assist pro/prebiotic   Propylene Glycol 0.6 % SOLN Place 1 drop into both eyes 2 times daily.   rosuvastatin (CRESTOR) 20 MG tablet Take 20 mg by mouth daily.   Vibegron (GEMTESA) 75 MG  TABS Take 1 tablet by mouth daily. (Patient not taking: Reported on 07/13/2021)   No facility-administered encounter medications on file as of 10/22/2021.    Thank you for the opportunity to participate in the care of Mr. Holzschuh.  The palliative care team will continue to follow. Please call our office at [phone] if we can be of additional assistance.   Hollace Kinnier, DO  COVID-19 PATIENT SCREENING TOOL Asked and negative response unless otherwise noted:  Have you had symptoms of covid, tested positive or been in contact with someone with symptoms/positive test in the past 5-10 days? No

## 2021-11-10 NOTE — Progress Notes (Signed)
Assessment/Plan:   1.  Parkinsons Disease             -The patient underwent right STN DBS on December 10, 2011 and a left STN DBS on December 27, 2012.  Postoperatively, his left STN DBS lead was found to be displaced and had revision of the left lead on February 02, 2013 (pulled back 1 cm without MER).   IPG on the RIGHT changed on 01/23/19.  Battery may be due for change in the next 6 months.  Discussed with him changing with Medtronic versus [employer] rechargeable device.             -Patient underwent lead revision on January 16, 2019 on the left, as the lead was still displaced/deep.  It was pulled back 7.5 mm.  Postoperative imaging indicated that it was still 5 mm too deep, so when the battery was changed to the following week, it was pulled back another 5 mm.  Postoperative imaging indicates that the leads were in good place, but the patient hit his head on the corner of a wall and developed an abrasion over the lead, which ultimately exposed to lead.  This led to a wound infection, requiring that the entire system on the left be removed on January 22, 2020.  We have opted not to replace this lead, as the patient has really clinically done pretty well, but we also worry about skin integrity on the scalp.  -Continue carbidopa/levodopa 25/100, 2 at 7am, 2 at 9:30am, 2 at noon, 2 at 2:30 pm, 2 at 5pm.  Discussed with him that he really needs to take an extra half tablet to 1 tablet as needed for off episodes or for dose failure.  -Continue entacapone 200 mg tid with every other dose of levodopa.    -Continue amantadine, 100 mg twice per day  -Continue Inbrija as needed, up to 5 times per day, separated by 2-hour intervals.  Told him that he may need to schedule this for first morning on and perhaps 2 other times during the day.  He is currently taking this about twice per day.  -[facility], home ramp and other equipment for the home.  He also has an aide in the  home from the [place] the patient as he is doing exercise and really looks quite well from a Parkinson's standpoint.    2.  RBD             -Continue clonazepam, 0.5 mg, 1.5 tablets at night.     3.  Urinary incontinence             -Following with urology.   4.  Low back pain             -Follows with Dr. Letta Pate as well as [facility] neurosurgery.  He is having more difficulties with his back since his DBS was removed (not able to move nearly as well).  I think that the right leg pain may be from this as opposed to Parkinson's disease.  He was told to try an extra levodopa when he has it, and if it does not work, he needs to get back to see Dr. Letta Pate.  He previously saw Dr. Prince Rome as well at [facility], who has since retired.  5.  Depression  -Continue Lexapro, 20 mg daily.  Refilled today.  -[facility].  Has been participating with counseling.  6.  Sleep apnea  -  Following with neurology in [facility] (sees the PA).  On CPAP.  7.  Dizziness  -keep track of BP even in standing position.  May need to decrease lisinopril  -noting HR low with exercise.  I told him that this is not normal response.  We will refer him to cardiology Bettina Gavia, [place], [place]) for further evaluation. Subjective:   Patrick Ross was seen today.  Patient accompanied by wife who supplements history.  Patient has been exercising.  They called since last visit asking about other medications specifically for freezing.  Discussed that there really are not medicines that help with that.  Patient is following with palliative care.  Last seen August 24.  Records are reviewed.  He is biking at [place].  Going to PT at [facility] 1 time per week.  1 near fall but daughter caught him.  He was putting on a shirt when it happened.  C/o dizziness  - wife wonders if related to shallow breathing.  BP at home has been good.  They notes its high today b/c of long walk up to our office.  Noting that HR is  going low with exercise every time (down into 50's even though its normally in 80's)  Current prescribed movement disorder medications: carbidopa/levodopa 25/100, 2 at 7am, 2 at 9:30am, 2 at noon, 2 at 2:30 pm, 2 at 5pm and an extra as needed (they take a 1/2 toward bedtime) Clonazepam 0.5 mg, 1-1/2 tablets at bedtime Amantadine, 100 mg twice per day  Inbrija (using bid ) Lexapro, 20 mg daily   PREVIOUS MEDICATIONS: amantadine; klonopin; carbidopa/levodopa 25/100; carbidopa/levodopa 50/200; stalevo; Osmolex -had dizziness with higher dosages and unable to afford the $60 per month co-pay for the lower dose.   ALLERGIES:   Allergies  Allergen Reactions   Other Other (See Comments)    Surgical spray prior to adhesive causes blisters   Sulfamethoxazole Other (See Comments)    Unsure--childhood allergy    CURRENT MEDICATIONS:  Outpatient Encounter Medications as of 11/12/2021  Medication Sig   amantadine (SYMMETREL) 100 MG capsule Take 1 capsule (100 mg total) by mouth 2 (two) times daily.   AMBULATORY NON FORMULARY MEDICATION Lift chair Dx:  G20 (Patient taking differently: Lift chair Dx:  G20)   carbidopa-levodopa (SINEMET IR) 25-100 MG tablet 2 at 7am, 2 at 9:30am, 2 at noon, 2 at 2:30 pm, 2 at 5pm.  If you need an extra late evening, you may take 1 extra   clonazePAM (KLONOPIN) 0.5 MG tablet TAKE 1 AND 1/2 TABLETS BY MOUTH AT BEDTIME   cycloSPORINE (RESTASIS) 0.05 % ophthalmic emulsion Place 1 drop into both eyes 2 times daily.   entacapone (COMTAN) 200 MG tablet 1 with 7am/noon/5pm dosages of levodopa (Patient not taking: Reported on 11/12/2021)   escitalopram (LEXAPRO) 20 MG tablet Take 1 tablet (20 mg total) by mouth daily.   famotidine (PEPCID) 20 MG tablet Take 20 mg by mouth 2 (two) times daily.   Levodopa (INBRIJA) 42 MG CAPS ORALLY INHALE CONTENTS OF 2 CAPSULES AS NEEDED FOR SYMPTOMS OF AN OFF PERIOD (DO NOT EXCEED 5 DOSES IN 1 DAY)   levothyroxine (SYNTHROID) 125 MCG tablet  Take 125 mcg by mouth daily.   lisinopril (PRINIVIL,ZESTRIL) 20 MG tablet Take 20 mg by mouth daily.   lisinopril (ZESTRIL) 40 MG tablet Take 1 tablet by mouth daily.   loteprednol (LOTEMAX) 0.5 % ophthalmic suspension Place 1 drop into both eyes 3 times daily.   Mirabegron (MYRBETRIQ PO) Take  by mouth.   nitrofurantoin, macrocrystal-monohydrate, (MACROBID) 100 MG capsule Take 1 capsule (100 mg total) by mouth daily. Begin after completing Cefdinir   OVER THE COUNTER MEDICATION Take 2 tablets by mouth 2 (two) times daily. Alpha CRS otc supplement   OVER THE COUNTER MEDICATION Take 2 capsules by mouth 2 (two) times daily. Microplex VMz otc supplement   OVER THE COUNTER MEDICATION Take 2 capsules by mouth 2 (two) times daily. XEOmega 3 oil complex   OVER THE COUNTER MEDICATION Take 1 tablet by mouth daily. onguard+ otc supplement   OVER THE COUNTER MEDICATION Take 3 capsules by mouth at bedtime. terrazyme supplement   OVER THE COUNTER MEDICATION Take 1 tablet by mouth 2 (two) times daily. Bone nutrient otc supplement   OVER THE COUNTER MEDICATION Take 1 capsule by mouth at bedtime. PB Assist pro/prebiotic   Propylene Glycol 0.6 % SOLN Place 1 drop into both eyes 2 times daily.   rosuvastatin (CRESTOR) 20 MG tablet Take 20 mg by mouth daily.   Vibegron (GEMTESA) 75 MG TABS Take 1 tablet by mouth daily. (Patient not taking: Reported on 07/13/2021)   No facility-administered encounter medications on file as of 11/12/2021.    Objective:   PHYSICAL EXAMINATION:    VITALS:   Vitals:   11/12/21 1328  BP: (!) 151/83  Pulse: 85  SpO2: 99%  Weight: 230 lb 12.8 oz (104.7 kg)  Height: '6\' 2"'$  (1.88 m)        GEN:  The patient appears stated age and is in NAD. HEENT:  Normocephalic, atraumatic. Cardiovascular: Regular rate rhythm Lungs: Clear to auscultation bilaterally Neck: No bruits  Neurological examination:  Orientation: The patient is alert and oriented x3. Cranial nerves: There is  good facial symmetry with facial hypomimia, lips parted. The speech is fluent and clear.  He is much less dysarthric than previous. Soft palate rises symmetrically and there is no tongue deviation. Hearing is intact to conversational tone. Sensation: Sensation is intact to light touch throughout Motor: Strength is at least antigravity x4.  Movement examination: Tone: There is normal tone in the UE/LE Abnormal movements: none.  R hand is a bit dystonic Coordination:  There is mild decremation on the R, with any form of RAMS, including alternating supination and pronation of the forearm, hand opening and closing, finger taps, heel taps and toe taps.  This is same as previous Gait and Station: Patient pushes off of the chair to arise.  Ambulates well with walker.  Some mild festination  I have reviewed and interpreted the following labs independently   Chemistry      Component Value Date/Time   NA 140 02/15/2020 1733   K 3.7 02/15/2020 1733   CL 103 02/15/2020 1733   CO2 23 02/15/2020 1733   BUN 17 02/15/2020 1733   CREATININE 0.75 02/15/2020 1733      Component Value Date/Time   CALCIUM 9.4 02/15/2020 1733   ALKPHOS 75 04/12/2008 0524   AST 32 04/12/2008 0524   ALT 27 04/12/2008 0524   BILITOT 1.2 04/12/2008 0524       DBS programming was performed today which is described in more detail on a separate programming procedure note.    Total time spent on today's visit was 30 minutes, including both face-to-face time and nonface-to-face time.  Time included that spent on review of records (prior notes available to me/labs/imaging if pertinent), discussing treatment and goals, answering patient's questions and coordinating care.  This did not include DBS  programming time, described in more detail on separate programming procedural note.    Cc:  Jefm Petty, MD

## 2021-11-12 ENCOUNTER — Ambulatory Visit (INDEPENDENT_AMBULATORY_CARE_PROVIDER_SITE_OTHER): Payer: Medicare Other | Admitting: Neurology

## 2021-11-12 ENCOUNTER — Other Ambulatory Visit: Payer: Self-pay

## 2021-11-12 ENCOUNTER — Encounter: Payer: Self-pay | Admitting: Neurology

## 2021-11-12 VITALS — BP 151/83 | HR 85 | Ht 74.0 in | Wt 230.8 lb

## 2021-11-12 DIAGNOSIS — R001 Bradycardia, unspecified: Secondary | ICD-10-CM

## 2021-11-12 DIAGNOSIS — G903 Multi-system degeneration of the autonomic nervous system: Secondary | ICD-10-CM

## 2021-11-12 DIAGNOSIS — R42 Dizziness and giddiness: Secondary | ICD-10-CM | POA: Diagnosis not present

## 2021-11-12 DIAGNOSIS — G20 Parkinson's disease: Secondary | ICD-10-CM

## 2021-11-12 DIAGNOSIS — Z9689 Presence of other specified functional implants: Secondary | ICD-10-CM

## 2021-11-12 NOTE — Procedures (Signed)
DBS Programming was performed.    Manufacturer of DBS device: Medtronic  Total time spent programming was 8  minutes.  Device was confirmed to be on.  Soft start was confirmed to be on.  Impedences were checked and were within normal limits.  Battery was checked and was determined to be functioning normally and not near the end of life.  Final settings were as follows with group A active:   Active Contact Amplitude (V) PW (ms) Frequency (hz) Side Effects Battery  Left Brain        Group A        12/20/19 3-C+ 3.1 70 140  2.89  01/29/20 N/A       03/03/20 N/A       05/19/20 n/a       09/16/20 N/a                               Group B        12/20/19 2-C+ 2.0 70 130            Right Brain        Group A        12/20/19 3-C+ 3.2 90 135  2.94  01/29/20 3-C+ 3.2 (2.4-3.6) 90 135  2.94  03/03/20 3-C+ 3.2 90 135  2.94  05/19/20 3-C+ 3.2 (2.4-3.6) 90 135  2.92  09/16/20 3-C+ 3.2 90 135  2.90  01/12/21 3-C+ 3.25 90 135  2.88  07/13/21 3-C+ 3.25 90 135  2.82  11/12/21 3-C+ 3.25 90 135  2.73

## 2021-11-12 NOTE — Progress Notes (Signed)
Cardiology

## 2021-11-20 ENCOUNTER — Ambulatory Visit: Payer: Medicare Other | Admitting: Internal Medicine

## 2021-12-02 ENCOUNTER — Ambulatory Visit: Payer: Medicare Other | Admitting: Internal Medicine

## 2021-12-02 ENCOUNTER — Other Ambulatory Visit: Payer: Self-pay | Admitting: Neurology

## 2021-12-02 DIAGNOSIS — F330 Major depressive disorder, recurrent, mild: Secondary | ICD-10-CM

## 2021-12-11 ENCOUNTER — Other Ambulatory Visit: Payer: Self-pay

## 2021-12-11 ENCOUNTER — Other Ambulatory Visit: Payer: Self-pay | Admitting: Neurology

## 2021-12-11 DIAGNOSIS — F330 Major depressive disorder, recurrent, mild: Secondary | ICD-10-CM

## 2021-12-11 MED ORDER — CLONAZEPAM 0.5 MG PO TABS
0.5000 mg | ORAL_TABLET | ORAL | 0 refills | Status: DC
  Filled 2021-12-11: qty 135, 90d supply, fill #0

## 2021-12-11 NOTE — Telephone Encounter (Signed)
Pt's wife needs a refill on [place]rie clonazepam 0.'5mg'$ . Pharmacy states they are out of it. She wants to know what can be done since there is a Event organiser. Doesn't think he needs to go without

## 2021-12-11 NOTE — Telephone Encounter (Signed)
Pt wife called an was given list of pharmacies she stated [facility] will have to call and tranfers the rest of the script

## 2021-12-14 ENCOUNTER — Encounter: Payer: Self-pay | Admitting: Internal Medicine

## 2021-12-14 ENCOUNTER — Ambulatory Visit: Payer: Medicare Other | Admitting: Internal Medicine

## 2021-12-14 VITALS — Temp 97.5°F | Resp 16 | Ht 74.0 in | Wt 236.0 lb

## 2021-12-14 DIAGNOSIS — I2089 Other forms of angina pectoris: Secondary | ICD-10-CM

## 2021-12-14 DIAGNOSIS — G903 Multi-system degeneration of the autonomic nervous system: Secondary | ICD-10-CM | POA: Insufficient documentation

## 2021-12-14 DIAGNOSIS — I209 Angina pectoris, unspecified: Secondary | ICD-10-CM | POA: Insufficient documentation

## 2021-12-14 DIAGNOSIS — I10 Essential (primary) hypertension: Secondary | ICD-10-CM | POA: Insufficient documentation

## 2021-12-14 DIAGNOSIS — E782 Mixed hyperlipidemia: Secondary | ICD-10-CM

## 2021-12-14 DIAGNOSIS — R002 Palpitations: Secondary | ICD-10-CM

## 2021-12-14 NOTE — Progress Notes (Signed)
Primary Physician/Referring:  Jefm Petty, MD  Patient ID: Patrick Ross, male    DOB: 12/16/1945, 77 y.o.   MRN: [id number]  No chief complaint on file.  HPI:    Patrick Ross  is a 76 y.o. male with past medical history significant for hypertension, hyperlipidemia, and Parkinson's disease who is here to establish care with cardiology.  Patient did have a stress test many years ago and was supposed to get a heart catheterization however this did not end up happening for some reason.  Other than that patient denies any cardiac history.  He does not smoke or drink alcohol.  He does have Parkinson's disease and he is on a workout regiment for people with Parkinson's.  They have noticed that during exercise his heart rate has been dropping instead of increasing as it should.  Patient has not had an ultrasound of his heart in many years.  He does have orthostatic hypotension that is associated with his Parkinson's disease.  At rest he denies chest pain, shortness of breath, palpitations, diaphoresis, syncope, edema, orthopnea, PND.  Past Medical History:  Diagnosis Date   Depression    DVT (deep venous thrombosis) (HCC)    GERD (gastroesophageal reflux disease)    History of kidney stones    passed   Hypertension    Hypothyroidism    Lumbar stenosis    OSA (obstructive sleep apnea)    wears cpap   Parkinson's disease    PONV (postoperative nausea and vomiting)    REM behavioral disorder    Urinary incontinence    Past Surgical History:  Procedure Laterality Date   BACK SURGERY     x 2 - not fusion   COLONOSCOPY     DEEP BRAIN STIMULATOR PLACEMENT     left 12/27/2012, right 12/10/2011, left revision 02/02/2013, replacement bilateral 11/21/2015   MINOR PLACEMENT OF FIDUCIAL N/A 01/09/2019   Procedure: Fiducial placement;  Surgeon: Erline Levine, MD;  Location: [place];  Service: Neurosurgery;  Laterality: N/A;  Fiducial placement   PARTIAL NEPHRECTOMY Right 2005 ish   PULSE  GENERATOR IMPLANT Right 01/23/2019   Procedure: Right chest implantable pulse generator change;  Surgeon: Erline Levine, MD;  Location: [place];  Service: Neurosurgery;  Laterality: Right;   SPINAL FUSION     lumbar Fusion x 2   SUBTHALAMIC STIMULATOR BATTERY REPLACEMENT Left 02/02/2018   Procedure: Change implantable pulse generator battery, Left chest;  Surgeon: Erline Levine, MD;  Location: [place];  Service: Neurosurgery;  Laterality: Left;   SUBTHALAMIC STIMULATOR BATTERY REPLACEMENT N/A 01/22/2020   Procedure: REMOVAL OF LEFT BRAIN DEEP BRAIN STIMULATOR ELECTRODE, EXTENSION, AND PULSE GENERATOR;  Surgeon: Erline Levine, MD;  Location: [place];  Service: Neurosurgery;  Laterality: N/A;   SUBTHALAMIC STIMULATOR INSERTION Left 01/16/2019   Procedure: Left deep brain stimulator electrode revision.;  Surgeon: Erline Levine, MD;  Location: [place];  Service: Neurosurgery;  Laterality: Left;   SUBTHALAMIC STIMULATOR INSERTION Left 01/23/2019   Procedure: Repositioning of Left Deep Brain Stimulator Electrode;  Surgeon: Erline Levine, MD;  Location: [place];  Service: Neurosurgery;  Laterality: Left;   TONSILLECTOMY     Family History  Problem Relation Age of Onset   AAA (abdominal aortic aneurysm) Mother        42 yo   Heart disease Father    Diabetes Father    Diabetes Brother    Healthy Child     Social History   Tobacco Use   Smoking status: Never  Smokeless tobacco: Never  Substance Use Topics   Alcohol use: Not Currently   Marital Status: Married  ROS  Review of Systems  Cardiovascular:  Positive for chest pain, dyspnea on exertion and irregular heartbeat.  Neurological:  Positive for light-headedness.   Objective  Temperature (!) 97.5 F (36.4 C), temperature source Temporal, resp. rate 16, height '6\' 2"'$  (1.88 m), weight 236 lb (107 kg), SpO2 92 %. Body mass index is 30.3 kg/m.     12/14/2021    1:53 PM 11/12/2021    1:28 PM 10/22/2021    9:15 AM  Vitals with BMI  Height '6\' 2"'$   '6\' 2"'$    Weight 236 lbs 230 lbs 13 oz 232 lbs  BMI 69.62 95.28   Systolic  413 244  Diastolic  83 70  Pulse  85 82     Physical Exam Vitals and nursing note reviewed.  HENT:     Head: Normocephalic and atraumatic.  Cardiovascular:     Rate and Rhythm: Normal rate and regular rhythm.     Pulses: Normal pulses.     Heart sounds: Murmur heard.  Pulmonary:     Effort: Pulmonary effort is normal.     Breath sounds: Wheezing present.  Abdominal:     General: Bowel sounds are normal.  Musculoskeletal:     Right lower leg: No edema.     Left lower leg: No edema.  Skin:    General: Skin is warm and dry.  Neurological:     Mental Status: He is alert.     Medications and allergies   Allergies  Allergen Reactions   Other Other (See Comments)    Surgical spray prior to adhesive causes blisters   Sulfamethoxazole Other (See Comments)    Unsure--childhood allergy     Medication list after today's encounter   Current Outpatient Medications:    amantadine (SYMMETREL) 100 MG capsule, Take 1 capsule (100 mg total) by mouth 2 (two) times daily., Disp: 180 capsule, Rfl: 1   AMBULATORY NON FORMULARY MEDICATION, Lift chair Dx:  G20 (Patient taking differently: Lift chair Dx:  G20), Disp: 1 Device, Rfl: 0   carbidopa-levodopa (SINEMET IR) 25-100 MG tablet, 2 at 7am, 2 at 9:30am, 2 at noon, 2 at 2:30 pm, 2 at 5pm.  If you need an extra late evening, you may take 1 extra, Disp: 915 tablet, Rfl: 0   clonazePAM (KLONOPIN) 0.5 MG tablet, TAKE 1 AND 1/2 TABLETS BY MOUTH AT BEDTIME, Disp: 135 tablet, Rfl: 1   cycloSPORINE (RESTASIS) 0.05 % ophthalmic emulsion, Place 1 drop into both eyes 2 times daily., Disp: , Rfl:    escitalopram (LEXAPRO) 20 MG tablet, Take 1 tablet (20 mg total) by mouth daily., Disp: 270 tablet, Rfl: 2   famotidine (PEPCID) 20 MG tablet, Take 20 mg by mouth 2 (two) times daily., Disp: , Rfl:    Levodopa (INBRIJA) 42 MG CAPS, ORALLY INHALE CONTENTS OF 2 CAPSULES AS NEEDED FOR  SYMPTOMS OF AN OFF PERIOD (DO NOT EXCEED 5 DOSES IN 1 DAY), Disp: 300 capsule, Rfl: 1   levothyroxine (SYNTHROID) 125 MCG tablet, Take 125 mcg by mouth daily., Disp: , Rfl:    lisinopril (ZESTRIL) 40 MG tablet, Take 1 tablet by mouth daily., Disp: , Rfl:    Mirabegron (MYRBETRIQ PO), Take by mouth., Disp: , Rfl:    nitrofurantoin, macrocrystal-monohydrate, (MACROBID) 100 MG capsule, Take 1 capsule (100 mg total) by mouth daily. Begin after completing Cefdinir, Disp: 30 capsule, Rfl: 1  OVER THE COUNTER MEDICATION, Take 2 tablets by mouth 2 (two) times daily. Alpha CRS otc supplement, Disp: , Rfl:    OVER THE COUNTER MEDICATION, Take 2 capsules by mouth 2 (two) times daily. Microplex VMz otc supplement, Disp: , Rfl:    OVER THE COUNTER MEDICATION, Take 2 capsules by mouth 2 (two) times daily. XEOmega 3 oil complex, Disp: , Rfl:    OVER THE COUNTER MEDICATION, Take 1 tablet by mouth daily. onguard+ otc supplement, Disp: , Rfl:    OVER THE COUNTER MEDICATION, Take 3 capsules by mouth at bedtime. terrazyme supplement, Disp: , Rfl:    OVER THE COUNTER MEDICATION, Take 1 tablet by mouth 2 (two) times daily. Bone nutrient otc supplement, Disp: , Rfl:    OVER THE COUNTER MEDICATION, Take 1 capsule by mouth at bedtime. PB Assist pro/prebiotic, Disp: , Rfl:    Propylene Glycol 0.6 % SOLN, Place 1 drop into both eyes 2 times daily., Disp: , Rfl:    rosuvastatin (CRESTOR) 20 MG tablet, Take 20 mg by mouth daily., Disp: , Rfl:    entacapone (COMTAN) 200 MG tablet, 1 with 7am/noon/5pm dosages of levodopa (Patient not taking: Reported on 11/12/2021), Disp: 270 tablet, Rfl: 1   Vibegron (GEMTESA) 75 MG TABS, Take 1 tablet by mouth daily. (Patient not taking: Reported on 07/13/2021), Disp: 30 tablet, Rfl: 0  Laboratory examination:   Lab Results  Component Value Date   NA 140 02/15/2020   K 3.7 02/15/2020   CO2 23 02/15/2020   GLUCOSE 120 (H) 02/15/2020   BUN 17 02/15/2020   CREATININE 0.75 02/15/2020    CALCIUM 9.4 02/15/2020   GFRNONAA >60 02/15/2020       Latest Ref Rng & Units 02/15/2020    5:33 PM 01/22/2020   11:09 AM 01/16/2019    6:01 AM  CMP  Glucose 70 - 99 mg/dL 120  95  101   BUN 8 - 23 mg/dL '17  15  19   '$ Creatinine 0.61 - 1.24 mg/dL 0.75  0.86  1.08   Sodium 135 - 145 mmol/L 140  140  144   Potassium 3.5 - 5.1 mmol/L 3.7  4.1  4.1   Chloride 98 - 111 mmol/L 103  105  109   CO2 22 - 32 mmol/L '23  24  26   '$ Calcium 8.9 - 10.3 mg/dL 9.4  9.0  9.2       Latest Ref Rng & Units 02/15/2020    5:33 PM 01/22/2020   11:09 AM 01/16/2019    6:01 AM  CBC  WBC 4.0 - 10.5 K/uL 5.6  5.0  4.5   Hemoglobin 13.0 - 17.0 g/dL 14.7  16.3  15.0   Hematocrit 39.0 - 52.0 % 43.3  50.0  44.4   Platelets 150 - 400 K/uL 160  155  169     Lipid Panel No results for input(s): "CHOL", "TRIG", "LDLCALC", "VLDL", "HDL", "CHOLHDL", "LDLDIRECT" in the last 8760 hours.  HEMOGLOBIN A1C No results found for: "HGBA1C", "MPG" TSH No results for input(s): "TSH" in the last 8760 hours.  External labs:     Radiology:    Cardiac Studies:   No results found for this or any previous visit from the past 1095 days.     No results found for this or any previous visit from the past 1095 days.     EKG:   12/14/2021 NSR, HR 86, normal R wave progression, no evidence of ischemia  Assessment  ICD-10-CM   1. Palpitations  R00.2 EKG 12-Lead    PCV ECHOCARDIOGRAM COMPLETE    PCV MYOCARDIAL PERFUSION WITH LEXISCAN    2. Essential hypertension  I10 PCV ECHOCARDIOGRAM COMPLETE    PCV MYOCARDIAL PERFUSION WITH LEXISCAN    3. Mixed hyperlipidemia  E78.2 PCV ECHOCARDIOGRAM COMPLETE    PCV MYOCARDIAL PERFUSION WITH LEXISCAN    4. Orthostatic hypotension due to Parkinson's disease ([place])  G90.3 PCV ECHOCARDIOGRAM COMPLETE    PCV MYOCARDIAL PERFUSION WITH LEXISCAN    5. Angina pectoris (HCC)  I20.9     6. Stable angina pectoris  I20.89 PCV MYOCARDIAL PERFUSION WITH LEXISCAN       Orders  Placed This Encounter  Procedures   PCV MYOCARDIAL PERFUSION WITH LEXISCAN    Standing Status:   Future    Standing Expiration Date:   02/13/2022   EKG 12-Lead   PCV ECHOCARDIOGRAM COMPLETE    Standing Status:   Future    Standing Expiration Date:   12/15/2022    No orders of the defined types were placed in this encounter.   Medications Discontinued During This Encounter  Medication Reason   lisinopril (PRINIVIL,ZESTRIL) 20 MG tablet    loteprednol (LOTEMAX) 0.5 % ophthalmic suspension    clonazePAM (KLONOPIN) 0.5 MG tablet      Recommendations:   Patrick Ross is a 76 y.o.  M with HTN, HLD, and Parkinson's disease  Palpitations Will check echo   Essential hypertension Continue lisinopril   Mixed hyperlipidemia Continue rosuvastatin   Stable angina pectoris Nuclear stress test and echo ordered  Follow-up in 2 months or sooner if needed    Floydene Flock, DO, [facility]  12/14/2021, 2:43 PM Office: [phone] Pager: [phone]

## 2021-12-18 ENCOUNTER — Other Ambulatory Visit: Payer: Self-pay

## 2021-12-18 DIAGNOSIS — G20A1 Parkinson's disease without dyskinesia, without mention of fluctuations: Secondary | ICD-10-CM

## 2021-12-18 MED ORDER — CARBIDOPA-LEVODOPA 25-100 MG PO TABS: ORAL_TABLET | ORAL | 0 refills | Status: DC

## 2022-01-04 ENCOUNTER — Ambulatory Visit: Payer: Medicare Other

## 2022-01-04 DIAGNOSIS — I10 Essential (primary) hypertension: Secondary | ICD-10-CM

## 2022-01-04 DIAGNOSIS — R002 Palpitations: Secondary | ICD-10-CM

## 2022-01-04 DIAGNOSIS — G903 Multi-system degeneration of the autonomic nervous system: Secondary | ICD-10-CM

## 2022-01-04 DIAGNOSIS — I2089 Other forms of angina pectoris: Secondary | ICD-10-CM

## 2022-01-04 DIAGNOSIS — E782 Mixed hyperlipidemia: Secondary | ICD-10-CM

## 2022-01-08 ENCOUNTER — Other Ambulatory Visit: Payer: Self-pay | Admitting: Neurology

## 2022-01-14 ENCOUNTER — Other Ambulatory Visit: Payer: Medicare Other | Admitting: Internal Medicine

## 2022-01-15 ENCOUNTER — Other Ambulatory Visit: Payer: Self-pay

## 2022-01-15 ENCOUNTER — Telehealth: Payer: Self-pay | Admitting: Neurology

## 2022-01-15 DIAGNOSIS — G20A1 Parkinson's disease without dyskinesia, without mention of fluctuations: Secondary | ICD-10-CM

## 2022-01-15 MED ORDER — INBRIJA 42 MG IN CAPS: ORAL_CAPSULE | RESPIRATORY_TRACT | 0 refills | Status: DC

## 2022-01-15 NOTE — Telephone Encounter (Signed)
Sent in prescription and called patient to let them know

## 2022-01-15 NOTE — Telephone Encounter (Signed)
1. Which medications need refilled? (List name and dosage, if known) inbrija  2. Which pharmacy/location is medication to be sent to? (include street and city if Management consultant) [place]

## 2022-02-05 NOTE — Progress Notes (Unsigned)
Assessment/Plan:   1.  Parkinsons Disease             -The patient underwent right STN DBS on December 10, 2011 and a left STN DBS on December 27, 2012.  Postoperatively, his left STN DBS lead was found to be displaced and had revision of the left lead on February 02, 2013 (pulled back 1 cm without MER).   IPG on the RIGHT changed on 01/23/19.  IPG is ERI.  Discussed options with him.  Discussed benefits of continuing with current battery versus changing to rechargeable and what type of rechargeable.  Patient's wife was thinking she wanted to continue with current antibiotic, but patient thinks he wants to change to rechargeable.  I contacted Dr. Baird Lyons.  I contacted both [employer] and [employer] rep regarding rechargeable's while he was in the room.  I was not able to get a hold of the [place], so we will await a call back.             -Patient underwent lead revision on January 16, 2019 on the left, as the lead was still displaced/deep.  It was pulled back 7.5 mm.  Postoperative imaging indicated that it was still 5 mm too deep, so when the battery was changed to the following week, it was pulled back another 5 mm.  Postoperative imaging indicates that the leads were in good place, but the patient hit his head on the corner of a wall and developed an abrasion over the lead, which ultimately exposed to lead.  This led to a wound infection, requiring that the entire system on the left be removed on January 22, 2020.  We have opted not to replace this lead, as the patient has really clinically done pretty well, but we also worry about skin integrity on the scalp.  -Continue carbidopa/levodopa 25/100, 2 at 7am, 2 at 9:30am, 2 at noon, 2 at 2:30 pm, 2 at 5pm.  Discussed with him that he really needs to take an extra half tablet to 1 tablet as needed for off episodes or for dose failure.  -Continue amantadine, 100 mg twice per day  -Continue Inbrija as needed, up to 5 times per day,  separated by 2-hour intervals.  Told him that he may need to schedule this for first morning on and perhaps 2 other times during the day.  He is currently taking this about twice per day.  -[facility], home ramp and other equipment for the home.  He also has an aide in the home from the [place].   2.  RBD             -Continue clonazepam, 0.5 mg, 1.5 tablets at night.     3.  Urinary incontinence             -Following with urology.   4.  Low back pain             -Follows with Dr. Letta Pate as well as [facility] neurosurgery.    5.  Depression  -Continue Lexapro, 20 mg daily.  Refilled today.  6.  Sleep apnea  -Following with neurology in [facility] (sees the PA).  On CPAP.  7.  Dizziness with bradycardia during exercise  -On lisinopril  -Now following with cardiology.  Nuclear stress test and echocardiogram were both unremarkable.  Subjective:   Patrick Ross was seen today.  Patient accompanied by wife who  supplements history.  Patient was referred to cardiology since last visit, because of complaints of bravdycardia when he exercised.  He saw [place] [facility] October 16.  They recommended an echocardiogram and nuclear stress test.  Echocardiogram was completed November 6 and demonstrated left ventricular ejection fraction of 63%.  Nuclear stress test was normal.  He is biking tues and thurs and going to [place] for exercise one time per week (via PT).  He goes for a walk with cna mon, wed, Friday.   He had 2 falls since last visit - no injuries.  With one he bent over to pick up a remote control, stood up and fell on the couch.  With the other, he was in the computer room without his walker and fell.  He didn't get hurt.  Current prescribed movement disorder medications: carbidopa/levodopa 25/100, 2 at 7am, 2 at 9:30am, 2 at noon, 2 at 2:30 pm, 2 at 5pm and an extra as needed (they take a 1/2 toward bedtime) Clonazepam 0.5 mg, 1-1/2 tablets at  bedtime Amantadine, 100 mg twice per day  Inbrija (using bid ) Lexapro, 20 mg daily   PREVIOUS MEDICATIONS: amantadine; klonopin; carbidopa/levodopa 25/100; carbidopa/levodopa 50/200; stalevo; Osmolex -had dizziness with higher dosages and unable to afford the $60 per month co-pay for the lower dose; entacapone (confusion)   ALLERGIES:   Allergies  Allergen Reactions   Other Other (See Comments)    Surgical spray prior to adhesive causes blisters   Sulfamethoxazole Other (See Comments)    Unsure--childhood allergy    CURRENT MEDICATIONS:  Outpatient Encounter Medications as of 02/08/2022  Medication Sig   amantadine (SYMMETREL) 100 MG capsule TAKE 1 CAPSULE TWICE A DAY   AMBULATORY NON FORMULARY MEDICATION Lift chair Dx:  G20 (Patient taking differently: Lift chair Dx:  G20)   carbidopa-levodopa (SINEMET IR) 25-100 MG tablet 2 at 7am, 2 at 9:30am, 2 at noon, 2 at 2:30 pm, 2 at 5pm.  If you need an extra late evening, you may take 1 extra   cholecalciferol (VITAMIN D3) 25 MCG (1000 UNIT) tablet Take 1,000 Units by mouth daily.   clonazePAM (KLONOPIN) 0.5 MG tablet TAKE 1 AND 1/2 TABLETS BY MOUTH AT BEDTIME   cycloSPORINE (RESTASIS) 0.05 % ophthalmic emulsion Place 1 drop into both eyes 2 times daily.   escitalopram (LEXAPRO) 20 MG tablet Take 1 tablet (20 mg total) by mouth daily.   famotidine (PEPCID) 20 MG tablet Take 20 mg by mouth 2 (two) times daily.   Levodopa (INBRIJA) 42 MG CAPS Place into inhaler and inhale.   Levodopa (INBRIJA) 42 MG CAPS ORALLY INHALE CONTENTS OF 2 CAPSULES AS NEEDED FOR SYMPTOMS OF AN OFF PERIOD (DO NOT EXCEED 5 DOSES IN 1 DAY)   levothyroxine (SYNTHROID) 125 MCG tablet Take 125 mcg by mouth daily.   lisinopril (ZESTRIL) 40 MG tablet Take 1 tablet by mouth daily.   Mirabegron (MYRBETRIQ PO) Take by mouth.   OVER THE COUNTER MEDICATION Take 2 tablets by mouth 2 (two) times daily. Alpha CRS otc supplement   OVER THE COUNTER MEDICATION Take 2 capsules by  mouth 2 (two) times daily. Microplex VMz otc supplement   OVER THE COUNTER MEDICATION Take 2 capsules by mouth 2 (two) times daily. XEOmega 3 oil complex   OVER THE COUNTER MEDICATION Take 1 tablet by mouth daily. onguard+ otc supplement   OVER THE COUNTER MEDICATION Take 3 capsules by mouth at bedtime. terrazyme supplement   OVER THE COUNTER MEDICATION Take 1 tablet by  mouth 2 (two) times daily. Bone nutrient otc supplement   OVER THE COUNTER MEDICATION Take 1 capsule by mouth at bedtime. PB Assist pro/prebiotic   rosuvastatin (CRESTOR) 20 MG tablet Take 20 mg by mouth daily.   [DISCONTINUED] entacapone (COMTAN) 200 MG tablet 1 with 7am/noon/5pm dosages of levodopa (Patient not taking: Reported on 11/12/2021)   [DISCONTINUED] nitrofurantoin, macrocrystal-monohydrate, (MACROBID) 100 MG capsule Take 1 capsule (100 mg total) by mouth daily. Begin after completing Cefdinir   [DISCONTINUED] Propylene Glycol 0.6 % SOLN Place 1 drop into both eyes 2 times daily.   [DISCONTINUED] Vibegron (GEMTESA) 75 MG TABS Take 1 tablet by mouth daily. (Patient not taking: Reported on 07/13/2021)   No facility-administered encounter medications on file as of 02/08/2022.    Objective:   PHYSICAL EXAMINATION:    VITALS:   Vitals:   02/08/22 1424  BP: (!) 142/78  Pulse: 85  SpO2: 98%  Weight: 234 lb 6.4 oz (106.3 kg)  Height: '6\' 2"'$  (1.88 m)    GEN:  The patient appears stated age and is in NAD. HEENT:  Normocephalic, atraumatic. Cardiovascular: Regular rate rhythm Lungs: Clear to auscultation bilaterally Neck: No bruits  Neurological examination:  Orientation: The patient is alert and oriented x3. Cranial nerves: There is good facial symmetry with facial hypomimia, lips parted. The speech is fluent and clear.  He is much less dysarthric than previous. Soft palate rises symmetrically and there is no tongue deviation. Hearing is intact to conversational tone. Sensation: Sensation is intact to light touch  throughout Motor: Strength is at least antigravity x4.  Movement examination: Tone: There is mild increased tone in the RUE/RLE (stable) Abnormal movements: none.   Coordination:  There is mild decremation on the R, with any form of RAMS, including alternating supination and pronation of the forearm, hand opening and closing, finger taps, heel taps and toe taps.  This is same as previous Gait and Station: Patient pushes off of the chair to arise.  Ambulates well with walker.  Some mild festination, and some trouble in the turn, but overall does fairly well with the U step.  I have reviewed and interpreted the following labs independently   Chemistry      Component Value Date/Time   NA 140 02/15/2020 1733   K 3.7 02/15/2020 1733   CL 103 02/15/2020 1733   CO2 23 02/15/2020 1733   BUN 17 02/15/2020 1733   CREATININE 0.75 02/15/2020 1733      Component Value Date/Time   CALCIUM 9.4 02/15/2020 1733   ALKPHOS 75 04/12/2008 0524   AST 32 04/12/2008 0524   ALT 27 04/12/2008 0524   BILITOT 1.2 04/12/2008 0524       DBS programming was performed today which is described in more detail on a separate programming procedure note.    Total time spent on today's visit was 60 minutes, including both face-to-face time and nonface-to-face time.  Time included that spent on review of records (prior notes available to me/labs/imaging if pertinent), discussing treatment and goals, answering patient's questions and coordinating care.  This did not include DBS programming time, described in more detail on separate programming procedural note.    Cc:  Jefm Petty, MD

## 2022-02-08 ENCOUNTER — Encounter: Payer: Self-pay | Admitting: Neurology

## 2022-02-08 ENCOUNTER — Ambulatory Visit (INDEPENDENT_AMBULATORY_CARE_PROVIDER_SITE_OTHER): Payer: Medicare Other | Admitting: Neurology

## 2022-02-08 VITALS — BP 142/78 | HR 85 | Ht 74.0 in | Wt 234.4 lb

## 2022-02-08 DIAGNOSIS — G20A1 Parkinson's disease without dyskinesia, without mention of fluctuations: Secondary | ICD-10-CM | POA: Diagnosis not present

## 2022-02-08 DIAGNOSIS — I209 Angina pectoris, unspecified: Secondary | ICD-10-CM | POA: Diagnosis not present

## 2022-02-08 DIAGNOSIS — G20A2 Parkinson's disease without dyskinesia, with fluctuations: Secondary | ICD-10-CM | POA: Diagnosis not present

## 2022-02-08 NOTE — Procedures (Signed)
DBS Programming was performed.    Manufacturer of DBS device: [employer]  Total time spent programming was 10  minutes.  Device was confirmed to be on.  Soft start was confirmed to be on.  Impedences were checked and were within normal limits.  Battery was checked and was determined to be functioning normally and near the end of life.  Final settings were as follows with group A active:   Active Contact Amplitude (V) PW (ms) Frequency (hz) Side Effects Battery  Left Brain        Group A        12/20/19 3-C+ 3.1 70 140  2.89  01/29/20 N/A       03/03/20 N/A       05/19/20 n/a       09/16/20 N/a                               Group B        12/20/19 2-C+ 2.0 70 130            Right Brain        Group A        12/20/19 3-C+ 3.2 90 135  2.94  01/29/20 3-C+ 3.2 (2.4-3.6) 90 135  2.94  03/03/20 3-C+ 3.2 90 135  2.94  05/19/20 3-C+ 3.2 (2.4-3.6) 90 135  2.92  09/16/20 3-C+ 3.2 90 135  2.90  01/12/21 3-C+ 3.25 90 135  2.88  07/13/21 3-C+ 3.25 90 135  2.82  11/12/21 3-C+ 3.25 90 135  2.73  02/08/22 3-C+ 3.25 90 135  2.56

## 2022-02-10 ENCOUNTER — Encounter: Payer: Self-pay | Admitting: Internal Medicine

## 2022-02-10 ENCOUNTER — Ambulatory Visit: Payer: Medicare Other | Admitting: Internal Medicine

## 2022-02-10 VITALS — BP 143/87 | HR 85 | Ht 74.0 in | Wt 231.4 lb

## 2022-02-10 DIAGNOSIS — I10 Essential (primary) hypertension: Secondary | ICD-10-CM

## 2022-02-10 DIAGNOSIS — E782 Mixed hyperlipidemia: Secondary | ICD-10-CM

## 2022-02-10 NOTE — Progress Notes (Signed)
Primary Physician/Referring:  Jefm Petty, MD  Patient ID: Katha Hamming, male    DOB: 1945-09-13, 76 y.o.   MRN: [id number]  Chief Complaint  Patient presents with   Palpitations   Follow-up   Results    HPI:    RYE DECOSTE  is a 76 y.o. male with past medical history significant for hypertension, hyperlipidemia, and Parkinson's disease who is here for a follow-up visit. He has been doing well since the last time he was here. He denies chest pain, shortness of breath, palpitations, diaphoresis, syncope, edema, orthopnea, PND.  Past Medical History:  Diagnosis Date   Depression    DVT (deep venous thrombosis) (HCC)    GERD (gastroesophageal reflux disease)    History of kidney stones    passed   Hypertension    Hypothyroidism    Lumbar stenosis    OSA (obstructive sleep apnea)    wears cpap   Parkinson's disease    PONV (postoperative nausea and vomiting)    REM behavioral disorder    Urinary incontinence    Past Surgical History:  Procedure Laterality Date   BACK SURGERY     x 2 - not fusion   COLONOSCOPY     DEEP BRAIN STIMULATOR PLACEMENT     left 12/27/2012, right 12/10/2011, left revision 02/02/2013, replacement bilateral 11/21/2015   MINOR PLACEMENT OF FIDUCIAL N/A 01/09/2019   Procedure: Fiducial placement;  Surgeon: Erline Levine, MD;  Location: [place];  Service: Neurosurgery;  Laterality: N/A;  Fiducial placement   PARTIAL NEPHRECTOMY Right 2005 ish   PULSE GENERATOR IMPLANT Right 01/23/2019   Procedure: Right chest implantable pulse generator change;  Surgeon: Erline Levine, MD;  Location: [place];  Service: Neurosurgery;  Laterality: Right;   SPINAL FUSION     lumbar Fusion x 2   SUBTHALAMIC STIMULATOR BATTERY REPLACEMENT Left 02/02/2018   Procedure: Change implantable pulse generator battery, Left chest;  Surgeon: Erline Levine, MD;  Location: [place];  Service: Neurosurgery;  Laterality: Left;   SUBTHALAMIC STIMULATOR BATTERY REPLACEMENT N/A 01/22/2020    Procedure: REMOVAL OF LEFT BRAIN DEEP BRAIN STIMULATOR ELECTRODE, EXTENSION, AND PULSE GENERATOR;  Surgeon: Erline Levine, MD;  Location: [place];  Service: Neurosurgery;  Laterality: N/A;   SUBTHALAMIC STIMULATOR INSERTION Left 01/16/2019   Procedure: Left deep brain stimulator electrode revision.;  Surgeon: Erline Levine, MD;  Location: [place];  Service: Neurosurgery;  Laterality: Left;   SUBTHALAMIC STIMULATOR INSERTION Left 01/23/2019   Procedure: Repositioning of Left Deep Brain Stimulator Electrode;  Surgeon: Erline Levine, MD;  Location: [place];  Service: Neurosurgery;  Laterality: Left;   TONSILLECTOMY     Family History  Problem Relation Age of Onset   AAA (abdominal aortic aneurysm) Mother        75 yo   Heart disease Father    Diabetes Father    Diabetes Brother    Healthy Child     Social History   Tobacco Use   Smoking status: Never   Smokeless tobacco: Never  Substance Use Topics   Alcohol use: Not Currently   Marital Status: Married  ROS  Review of Systems  Cardiovascular:  Negative for chest pain, dyspnea on exertion and irregular heartbeat.  Neurological:  Positive for light-headedness.   Objective  Blood pressure (!) 143/87, pulse 85, height '6\' 2"'$  (1.88 m), weight 231 lb 6.4 oz (105 kg), SpO2 97 %. Body mass index is 29.71 kg/m.     02/10/2022    1:30 PM 02/08/2022  2:24 PM 12/14/2021    1:53 PM  Vitals with BMI  Height '6\' 2"'$  '6\' 2"'$  '6\' 2"'$   Weight 231 lbs 6 oz 234 lbs 6 oz 236 lbs  BMI 29.7 85.63 14.97  Systolic 026 378   Diastolic 87 78   Pulse 85 85      Physical Exam Vitals and nursing note reviewed.  HENT:     Head: Normocephalic and atraumatic.  Cardiovascular:     Rate and Rhythm: Normal rate and regular rhythm.     Pulses: Normal pulses.     Heart sounds: Murmur heard.  Pulmonary:     Effort: Pulmonary effort is normal.     Breath sounds: Wheezing present.  Abdominal:     General: Bowel sounds are normal.  Musculoskeletal:     Right  lower leg: No edema.     Left lower leg: No edema.  Skin:    General: Skin is warm and dry.  Neurological:     Mental Status: He is alert.     Medications and allergies   Allergies  Allergen Reactions   Other Other (See Comments)    Surgical spray prior to adhesive causes blisters   Sulfamethoxazole Other (See Comments)    Unsure--childhood allergy     Medication list after today's encounter   Current Outpatient Medications:    amantadine (SYMMETREL) 100 MG capsule, TAKE 1 CAPSULE TWICE A DAY, Disp: 180 capsule, Rfl: 0   AMBULATORY NON FORMULARY MEDICATION, Lift chair Dx:  G20 (Patient taking differently: Lift chair Dx:  G20), Disp: 1 Device, Rfl: 0   carbidopa-levodopa (SINEMET IR) 25-100 MG tablet, 2 at 7am, 2 at 9:30am, 2 at noon, 2 at 2:30 pm, 2 at 5pm.  If you need an extra late evening, you may take 1 extra, Disp: 915 tablet, Rfl: 0   cholecalciferol (VITAMIN D3) 25 MCG (1000 UNIT) tablet, Take 1,000 Units by mouth daily., Disp: , Rfl:    clonazePAM (KLONOPIN) 0.5 MG tablet, TAKE 1 AND 1/2 TABLETS BY MOUTH AT BEDTIME, Disp: 135 tablet, Rfl: 1   cycloSPORINE (RESTASIS) 0.05 % ophthalmic emulsion, Place 1 drop into both eyes 2 times daily., Disp: , Rfl:    escitalopram (LEXAPRO) 20 MG tablet, Take 1 tablet (20 mg total) by mouth daily., Disp: 270 tablet, Rfl: 2   famotidine (PEPCID) 20 MG tablet, Take 20 mg by mouth 2 (two) times daily., Disp: , Rfl:    Levodopa (INBRIJA) 42 MG CAPS, Place into inhaler and inhale., Disp: , Rfl:    Levodopa (INBRIJA) 42 MG CAPS, ORALLY INHALE CONTENTS OF 2 CAPSULES AS NEEDED FOR SYMPTOMS OF AN OFF PERIOD (DO NOT EXCEED 5 DOSES IN 1 DAY), Disp: 300 capsule, Rfl: 0   levothyroxine (SYNTHROID) 125 MCG tablet, Take 125 mcg by mouth daily., Disp: , Rfl:    lisinopril (ZESTRIL) 40 MG tablet, Take 1 tablet by mouth daily., Disp: , Rfl:    Mirabegron (MYRBETRIQ PO), Take by mouth., Disp: , Rfl:    OVER THE COUNTER MEDICATION, Take 2 tablets by mouth 2  (two) times daily. Alpha CRS otc supplement, Disp: , Rfl:    OVER THE COUNTER MEDICATION, Take 2 capsules by mouth 2 (two) times daily. Microplex VMz otc supplement, Disp: , Rfl:    OVER THE COUNTER MEDICATION, Take 2 capsules by mouth 2 (two) times daily. XEOmega 3 oil complex, Disp: , Rfl:    OVER THE COUNTER MEDICATION, Take 1 tablet by mouth daily. onguard+ otc supplement, Disp: ,  Rfl:    OVER THE COUNTER MEDICATION, Take 3 capsules by mouth at bedtime. terrazyme supplement, Disp: , Rfl:    OVER THE COUNTER MEDICATION, Take 1 tablet by mouth 2 (two) times daily. Bone nutrient otc supplement, Disp: , Rfl:    OVER THE COUNTER MEDICATION, Take 1 capsule by mouth at bedtime. PB Assist pro/prebiotic, Disp: , Rfl:    rosuvastatin (CRESTOR) 20 MG tablet, Take 20 mg by mouth daily., Disp: , Rfl:   Laboratory examination:   Lab Results  Component Value Date   NA 140 02/15/2020   K 3.7 02/15/2020   CO2 23 02/15/2020   GLUCOSE 120 (H) 02/15/2020   BUN 17 02/15/2020   CREATININE 0.75 02/15/2020   CALCIUM 9.4 02/15/2020   GFRNONAA >60 02/15/2020       Latest Ref Rng & Units 02/15/2020    5:33 PM 01/22/2020   11:09 AM 01/16/2019    6:01 AM  CMP  Glucose 70 - 99 mg/dL 120  95  101   BUN 8 - 23 mg/dL '17  15  19   '$ Creatinine 0.61 - 1.24 mg/dL 0.75  0.86  1.08   Sodium 135 - 145 mmol/L 140  140  144   Potassium 3.5 - 5.1 mmol/L 3.7  4.1  4.1   Chloride 98 - 111 mmol/L 103  105  109   CO2 22 - 32 mmol/L '23  24  26   '$ Calcium 8.9 - 10.3 mg/dL 9.4  9.0  9.2       Latest Ref Rng & Units 02/15/2020    5:33 PM 01/22/2020   11:09 AM 01/16/2019    6:01 AM  CBC  WBC 4.0 - 10.5 K/uL 5.6  5.0  4.5   Hemoglobin 13.0 - 17.0 g/dL 14.7  16.3  15.0   Hematocrit 39.0 - 52.0 % 43.3  50.0  44.4   Platelets 150 - 400 K/uL 160  155  169     Lipid Panel No results for input(s): "CHOL", "TRIG", "LDLCALC", "VLDL", "HDL", "CHOLHDL", "LDLDIRECT" in the last 8760 hours.  HEMOGLOBIN A1C No results found  for: "HGBA1C", "MPG" TSH No results for input(s): "TSH" in the last 8760 hours.  External labs:     Radiology:    Cardiac Studies:   Echocardiogram 01/04/2022:  Left ventricle cavity is normal in size. Mild concentric hypertrophy of  the left ventricle. Normal global wall motion. Normal LV systolic function  with EF 63%. Doppler evidence of grade I (impaired) diastolic dysfunction,  normal LAP.  Trileaflet aortic valve with mild aortic valve leaflet calcification. No  regurgitation.  normal right atrial pressure.      The study is normal. The study is low risk.   Left ventricular function is normal. The left ventricular ejection fraction is normal (55-65%).   Lexiscan Nuclear stress test 01/04/2022: Myocardial perfusion is normal. Overall LV systolic function is normal without regional wall motion abnormalities. Stress LV EF: 59%.  Non-diagnostic ECG stress. The heart rate response was consistent with Regadenoson.  No previous exam available for comparison. Low risk.    EKG:   12/14/2021 NSR, HR 86, normal R wave progression, no evidence of ischemia  Assessment     ICD-10-CM   1. Essential hypertension  I10     2. Mixed hyperlipidemia  E78.2        No orders of the defined types were placed in this encounter.   No orders of the defined types were placed in this encounter.  There are no discontinued medications.    Recommendations:   ARIQ KHAMIS is a 76 y.o.  M with HTN, HLD, and Parkinson's disease   Essential hypertension Continue current cardiac medications. Encourage low-sodium diet, less than 2000 mg daily. Echo and stress test within normal limits   Mixed hyperlipidemia Continue crestor    Follow-up in 12 months or sooner if needed    Floydene Flock, DO, [facility]  02/17/2022, 1:45 PM Office: [phone] Pager: [phone]

## 2022-02-12 ENCOUNTER — Telehealth: Payer: Self-pay | Admitting: Neurology

## 2022-02-12 NOTE — Telephone Encounter (Signed)
Called [facility] and left message for Junie Panning the scheduler for Dr. Reatha Armour called patients wife back and let her know if she is not scheduled by 3:00 pm today to call me back

## 2022-02-12 NOTE — Telephone Encounter (Signed)
Pt's wife called in stating they have not heard back from Dr. Maggie Font office about replacing his battery.

## 2022-02-17 ENCOUNTER — Other Ambulatory Visit: Payer: Self-pay | Admitting: Neurological Surgery

## 2022-02-18 ENCOUNTER — Other Ambulatory Visit: Payer: Self-pay

## 2022-02-18 ENCOUNTER — Telehealth: Payer: Self-pay | Admitting: Anesthesiology

## 2022-02-18 ENCOUNTER — Encounter (HOSPITAL_COMMUNITY): Payer: Self-pay | Admitting: Neurological Surgery

## 2022-02-18 NOTE — Telephone Encounter (Deleted)
His follow up appointment with dr. Carles Collet isnt until

## 2022-02-18 NOTE — Telephone Encounter (Signed)
Called patients wife and she wants to know what will happen after surgery in order to get patients DBS turned back on. His appointment here isn't until April and they are hoping that someone will be there to turn back on before then please advise

## 2022-02-18 NOTE — Telephone Encounter (Signed)
Pt's wife left message with AN requesting a call back. She has questions regarding her husbands medical problem.

## 2022-02-18 NOTE — Telephone Encounter (Signed)
Called patients wife and let her know what Dr. Carles Collet had to say and patients wife understands

## 2022-02-18 NOTE — Telephone Encounter (Signed)
Pt's wife called in and left a message. She would like someone to call her back about turning the pt's DBS back on after his surgery on Tuesday.

## 2022-02-18 NOTE — Progress Notes (Signed)
PCP - Dr. Jeralene Huff Cardiologist - Dr. Loletha Grayer  EKG - 12/14/21 Chest x-ray - na ECHO - 01/04/22 Cardiac Cath - na CPAP - yes  Blood Thinner Instructions: na Aspirin Instructions: na ERAS Protcol - no COVID TEST- no  Anesthesia review: yes  -------------  SDW INSTRUCTIONS:  Your procedure is scheduled on 02/23/2022. Please report to [facility] Main Entrance "A" at 1030 A.M., and check in at the Admitting office. Call this number if you have problems the morning of surgery: [phone]   Remember: Do not eat or drink after midnight the night before your surgery  Medications to take morning of surgery with a sip of water include: amantadine, sinemet, pepcid, levothyroxine, myrbetriq, rosuvastatin   As of today, STOP taking any Aspirin (unless otherwise instructed by your surgeon), Aleve, Naproxen, Ibuprofen, Motrin, Advil, Goody's, BC's, all herbal medications, fish oil, and all vitamins.    The Morning of Surgery Do not wear jewelry, make-up or nail polish. Do not wear lotions, powders, or perfumes/colognes, or deodorant Do not bring valuables to the hospital. [facility] is not responsible for any belongings or valuables.  If you are a smoker, DO NOT Smoke 24 hours prior to surgery  If you wear a CPAP at night please bring your mask the morning of surgery   Remember that you must have someone to transport you home after your surgery, and remain with you for 24 hours if you are discharged the same day.  Please bring cases for contacts, glasses, hearing aids, dentures or bridgework because it cannot be worn into surgery.   Patients discharged the day of surgery will not be allowed to drive home.   Please shower the NIGHT BEFORE/MORNING OF SURGERY (use antibacterial soap like DIAL soap if possible). Wear comfortable clothes the morning of surgery. Oral Hygiene is also important to reduce your risk of infection.  Remember - BRUSH YOUR TEETH THE MORNING OF SURGERY WITH YOUR REGULAR  TOOTHPASTE  Patient denies shortness of breath, fever, cough and chest pain.

## 2022-02-19 ENCOUNTER — Telehealth: Payer: Self-pay

## 2022-02-19 NOTE — Anesthesia Preprocedure Evaluation (Signed)
Anesthesia Evaluation  Patient identified by MRN, date of birth, ID band Patient awake    Reviewed: Allergy & Precautions, NPO status , Patient's Chart, lab work & pertinent test results  History of Anesthesia Complications (+) PONV and history of anesthetic complications  Airway Mallampati: III  TM Distance: >3 FB Neck ROM: Limited    Dental  (+) Dental Advisory Given   Pulmonary sleep apnea    breath sounds clear to auscultation       Cardiovascular hypertension, Pt. on medications  Rhythm:Regular Rate:Normal     Neuro/Psych negative neurological ROS     GI/Hepatic Neg liver ROS,GERD  ,,  Endo/Other  Hypothyroidism    Renal/GU Renal disease     Musculoskeletal   Abdominal   Peds  Hematology negative hematology ROS (+)   Anesthesia Other Findings   Reproductive/Obstetrics                              Lab Results  Component Value Date   WBC 7.4 02/23/2022   HGB 15.4 02/23/2022   HCT 45.6 02/23/2022   MCV 94.0 02/23/2022   PLT 151 02/23/2022   Lab Results  Component Value Date   CREATININE 0.87 02/23/2022   BUN 17 02/23/2022   NA 142 02/23/2022   K 4.3 02/23/2022   CL 109 02/23/2022   CO2 27 02/23/2022    Anesthesia Physical Anesthesia Plan  ASA: 3  Anesthesia Plan: General   Post-op Pain Management: Tylenol PO (pre-op)*   Induction: Intravenous  PONV Risk Score and Plan: 3 and Dexamethasone, Ondansetron and Treatment may vary due to age or medical condition  Airway Management Planned: Oral ETT  Additional Equipment:   Intra-op Plan:   Post-operative Plan: Extubation in OR  Informed Consent: I have reviewed the patients History and Physical, chart, labs and discussed the procedure including the risks, benefits and alternatives for the proposed anesthesia with the patient or authorized representative who has indicated his/her understanding and acceptance.      Dental advisory given  Plan Discussed with: CRNA  Anesthesia Plan Comments: (PAT note written 02/19/2022 by Myra Gianotti, PA-C.  Echocardiogram 01/04/2022: Left ventricle cavity is normal in size. Mild concentric hypertrophy of the left ventricle. Normal global wall motion. Normal LV systolic function with EF 63%. Doppler evidence of grade I (impaired) diastolic dysfunction, normal LAP. Trileaflet aortic valve with mild aortic valve leaflet calcification. No regurgitation. normal right atrial pressure.   Lexiscan Nuclear stress test 01/04/2022: Myocardial perfusion is normal. Overall LV systolic function is normal without regional wall motion abnormalities. Stress LV EF: 59%.  Non-diagnostic ECG stress. The heart rate response was consistent with Regadenoson.  No previous exam available for comparison. Low risk.   The study is normal. The study is low risk.   Left ventricular function is normal. The left ventricular ejection fraction is normal (55-65%). )        Anesthesia Quick Evaluation

## 2022-02-19 NOTE — Progress Notes (Signed)
Anesthesia Chart Review: Patrick Ross  Case: [id number] Date/Time: 02/23/22 1246   Procedure: CHANGE IPG BATTERY, RT CHEST (Right)   Anesthesia type: General   Pre-op diagnosis: END OF LIFE OF DBS   Location: [facility] OR ROOM 9 / [place] OR   Surgeons: Dawley, Theodoro Doing, DO       DISCUSSION: Patient is a 75 year old male scheduled for the above procedure.  History includes never smoker, post-operative N/V, Parkinson's disease (s/p right STN DBS 10/11/3, left STN DBS 12/27/12; several revisions, last 01/22/20), hypothyroidism, GERD, HTN, OSA (CPAP), DVT, spinal surgery, partial nephrectomy,   Right chest implantable pulse generator change (Right) Repositioning of Left Deep Brain Stimulator Electrode (Left)   Recent reassuring cardiac testing at [facility] cardiovascular for evaluation of palpitations. (See CV below.)  Patient/wife aware to bring Medtronic remote with him on the day of surgery--"to have the battery changed out for the [employer]."   He is a same-day workup.  Anesthesia team to evaluate on the day of surgery.  VS: Ht '6\' 2"'$  (1.88 m)   Wt 106.1 kg   BMI 30.04 kg/m  BP Readings from Last 3 Encounters:  02/10/22 (!) 143/87  02/08/22 (!) 142/78  11/12/21 (!) 151/83   Pulse Readings from Last 3 Encounters:  02/10/22 85  02/08/22 85  11/12/21 85     PROVIDERS: Jefm Petty, MD is PCP Tat, Wells Guiles, DO is neurologist Custovic, Collene Mares, DO is cardiologist [facility] CV)  LABS: For day of surgery as indicated.  As of 11/26/2021 ([facility]), Free T4 1.2, TSH 0.7, normal CMP with creatinine 0.91, A1c 5.6.   EKG: 12/14/21: Sinus Rhythm  WITHIN NORMAL LIMITS   CV: Echocardiogram 01/04/2022: Left ventricle cavity is normal in size. Mild concentric hypertrophy of the left ventricle. Normal global wall motion. Normal LV systolic function with EF 63%. Doppler evidence of grade I (impaired) diastolic dysfunction, normal LAP. Trileaflet aortic valve with mild aortic  valve leaflet calcification. No regurgitation. normal right atrial pressure.   Lexiscan Nuclear stress test 01/04/2022: Myocardial perfusion is normal. Overall LV systolic function is normal without regional wall motion abnormalities. Stress LV EF: 59%.  Non-diagnostic ECG stress. The heart rate response was consistent with Regadenoson.  No previous exam available for comparison. Low risk.   The study is normal. The study is low risk.   Left ventricular function is normal. The left ventricular ejection fraction is normal (55-65%).  Past Medical History:  Diagnosis Date   Depression    DVT (deep venous thrombosis) (HCC)    GERD (gastroesophageal reflux disease)    History of kidney stones    passed   Hypertension    Hypothyroidism    Lumbar stenosis    OSA (obstructive sleep apnea)    wears cpap   Parkinson's disease    PONV (postoperative nausea and vomiting)    REM behavioral disorder    Urinary incontinence     Past Surgical History:  Procedure Laterality Date   BACK SURGERY     x 2 - not fusion   COLONOSCOPY     DEEP BRAIN STIMULATOR PLACEMENT     left 12/27/2012, right 12/10/2011, left revision 02/02/2013, replacement bilateral 11/21/2015   MINOR PLACEMENT OF FIDUCIAL N/A 01/09/2019   Procedure: Fiducial placement;  Surgeon: Erline Levine, MD;  Location: [place];  Service: Neurosurgery;  Laterality: N/A;  Fiducial placement   PARTIAL NEPHRECTOMY Right 2005 ish   PULSE GENERATOR IMPLANT Right 01/23/2019   Procedure: Right chest implantable pulse generator  change;  Surgeon: Erline Levine, MD;  Location: [place];  Service: Neurosurgery;  Laterality: Right;   SPINAL FUSION     lumbar Fusion x 2   SUBTHALAMIC STIMULATOR BATTERY REPLACEMENT Left 02/02/2018   Procedure: Change implantable pulse generator battery, Left chest;  Surgeon: Erline Levine, MD;  Location: [place];  Service: Neurosurgery;  Laterality: Left;   SUBTHALAMIC STIMULATOR BATTERY REPLACEMENT N/A 01/22/2020    Procedure: REMOVAL OF LEFT BRAIN DEEP BRAIN STIMULATOR ELECTRODE, EXTENSION, AND PULSE GENERATOR;  Surgeon: Erline Levine, MD;  Location: [place];  Service: Neurosurgery;  Laterality: N/A;   SUBTHALAMIC STIMULATOR INSERTION Left 01/16/2019   Procedure: Left deep brain stimulator electrode revision.;  Surgeon: Erline Levine, MD;  Location: [place];  Service: Neurosurgery;  Laterality: Left;   SUBTHALAMIC STIMULATOR INSERTION Left 01/23/2019   Procedure: Repositioning of Left Deep Brain Stimulator Electrode;  Surgeon: Erline Levine, MD;  Location: [place];  Service: Neurosurgery;  Laterality: Left;   TONSILLECTOMY      MEDICATIONS: No current facility-administered medications for this encounter.    amantadine (SYMMETREL) 100 MG capsule   Bacillus Coagulans-Inulin (PROBIOTIC-PREBIOTIC PO)   carbidopa-levodopa (SINEMET IR) 25-100 MG tablet   clonazePAM (KLONOPIN) 0.5 MG tablet   cycloSPORINE (RESTASIS) 0.05 % ophthalmic emulsion   DIGESTIVE ENZYMES PO   escitalopram (LEXAPRO) 20 MG tablet   famotidine (PEPCID) 20 MG tablet   Levodopa (INBRIJA) 42 MG CAPS   levothyroxine (SYNTHROID) 125 MCG tablet   lisinopril (ZESTRIL) 40 MG tablet   mirabegron ER (MYRBETRIQ) 50 MG TB24 tablet   Multiple Minerals-Vitamins (BONE ESSENTIALS) CAPS   Multiple Vitamins-Minerals (CELLULAR SECURITY PO)   Multiple Vitamins-Minerals (IMMUNE SUPPORT PO)   Omega-3 Fatty Acids (OMEGA 3 PO)   OVER THE COUNTER MEDICATION   rosuvastatin (CRESTOR) 20 MG tablet   AMBULATORY NON FORMULARY MEDICATION    Myra Gianotti, PA-C Surgical Short Stay/Anesthesiology [facility] Phone [phone] [facility] Phone [phone] 02/19/2022 3:04 PM

## 2022-02-19 NOTE — Telephone Encounter (Signed)
Called and spoke to patients wife regarding bringing the Medtronic remote charged to the surgery on Tuesday to have the battery changed out for the [employer]. Patients wife already had it in the bag and ready for that surgery

## 2022-02-23 ENCOUNTER — Other Ambulatory Visit: Payer: Self-pay

## 2022-02-23 ENCOUNTER — Ambulatory Visit (HOSPITAL_BASED_OUTPATIENT_CLINIC_OR_DEPARTMENT_OTHER): Payer: Medicare Other | Admitting: Certified Registered Nurse Anesthetist

## 2022-02-23 ENCOUNTER — Ambulatory Visit (HOSPITAL_COMMUNITY)
Admission: RE | Admit: 2022-02-23 | Discharge: 2022-02-23 | Disposition: A | Discharge status: Home / Self Care | Payer: Medicare Other | Attending: Neurological Surgery | Admitting: Neurological Surgery

## 2022-02-23 ENCOUNTER — Encounter (HOSPITAL_COMMUNITY)
Admission: RE | Disposition: A | Discharge status: Home / Self Care | Payer: Self-pay | Source: Home / Self Care | Attending: Neurological Surgery

## 2022-02-23 ENCOUNTER — Encounter (HOSPITAL_COMMUNITY): Payer: Self-pay | Admitting: Neurological Surgery

## 2022-02-23 ENCOUNTER — Ambulatory Visit (HOSPITAL_COMMUNITY): Payer: Medicare Other | Admitting: Certified Registered Nurse Anesthetist

## 2022-02-23 DIAGNOSIS — G20A1 Parkinson's disease without dyskinesia, without mention of fluctuations: Secondary | ICD-10-CM | POA: Insufficient documentation

## 2022-02-23 DIAGNOSIS — G4733 Obstructive sleep apnea (adult) (pediatric): Secondary | ICD-10-CM | POA: Diagnosis not present

## 2022-02-23 DIAGNOSIS — Z4549 Encounter for adjustment and management of other implanted nervous system device: Secondary | ICD-10-CM | POA: Insufficient documentation

## 2022-02-23 DIAGNOSIS — Z4542 Encounter for adjustment and management of neuropacemaker (brain) (peripheral nerve) (spinal cord): Secondary | ICD-10-CM

## 2022-02-23 DIAGNOSIS — Z905 Acquired absence of kidney: Secondary | ICD-10-CM | POA: Insufficient documentation

## 2022-02-23 DIAGNOSIS — I10 Essential (primary) hypertension: Secondary | ICD-10-CM

## 2022-02-23 DIAGNOSIS — E039 Hypothyroidism, unspecified: Secondary | ICD-10-CM

## 2022-02-23 DIAGNOSIS — K219 Gastro-esophageal reflux disease without esophagitis: Secondary | ICD-10-CM | POA: Diagnosis not present

## 2022-02-23 DIAGNOSIS — Z86718 Personal history of other venous thrombosis and embolism: Secondary | ICD-10-CM | POA: Diagnosis not present

## 2022-02-23 DIAGNOSIS — G4730 Sleep apnea, unspecified: Secondary | ICD-10-CM | POA: Diagnosis not present

## 2022-02-23 HISTORY — PX: SUBTHALAMIC STIMULATOR BATTERY REPLACEMENT: SHX5405

## 2022-02-23 LAB — CBC
HCT: 45.6 % (ref 39.0–52.0)
Hemoglobin: 15.4 g/dL (ref 13.0–17.0)
MCH: 31.8 pg (ref 26.0–34.0)
MCHC: 33.8 g/dL (ref 30.0–36.0)
MCV: 94 fL (ref 80.0–100.0)
Platelets: 151 10*3/uL (ref 150–400)
RBC: 4.85 MIL/uL (ref 4.22–5.81)
RDW: 12.2 % (ref 11.5–15.5)
WBC: 7.4 10*3/uL (ref 4.0–10.5)
nRBC: 0 % (ref 0.0–0.2)

## 2022-02-23 LAB — BASIC METABOLIC PANEL
Anion gap: 6 (ref 5–15)
BUN: 17 mg/dL (ref 8–23)
CO2: 27 mmol/L (ref 22–32)
Calcium: 8.8 mg/dL — ABNORMAL LOW (ref 8.9–10.3)
Chloride: 109 mmol/L (ref 98–111)
Creatinine, Ser: 0.87 mg/dL (ref 0.61–1.24)
GFR, Estimated: >60 mL/min (ref >60)
Glucose, Bld: 99 mg/dL (ref 70–99)
Potassium: 4.3 mmol/L (ref 3.5–5.1)
Sodium: 142 mmol/L (ref 135–145)

## 2022-02-23 SURGERY — SUBTHALAMIC STIMULATOR BATTERY REPLACEMENT
Anesthesia: General | Laterality: Right

## 2022-02-23 MED ORDER — CEFAZOLIN SODIUM-DEXTROSE 2-4 GM/100ML-% IV SOLN
2.0000 g | INTRAVENOUS | Status: AC
  Administered 2022-02-23: 2 g via INTRAVENOUS
  Filled 2022-02-23: qty 100

## 2022-02-23 MED ORDER — FENTANYL CITRATE (PF) 250 MCG/5ML IJ SOLN
INTRAMUSCULAR | Status: DC | PRN
  Administered 2022-02-23: 50 ug via INTRAVENOUS

## 2022-02-23 MED ORDER — FENTANYL CITRATE (PF) 100 MCG/2ML IJ SOLN: 25.0000 ug | INTRAMUSCULAR | Status: DC | PRN

## 2022-02-23 MED ORDER — ARTIFICIAL TEARS OPHTHALMIC OINT
TOPICAL_OINTMENT | OPHTHALMIC | Status: AC
  Filled 2022-02-23: qty 3.5

## 2022-02-23 MED ORDER — AMISULPRIDE (ANTIEMETIC) 5 MG/2ML IV SOLN: 10.0000 mg | Freq: Once | INTRAVENOUS | Status: DC | PRN

## 2022-02-23 MED ORDER — LIDOCAINE 2% (20 MG/ML) 5 ML SYRINGE
INTRAMUSCULAR | Status: AC
  Filled 2022-02-23: qty 5

## 2022-02-23 MED ORDER — ROCURONIUM BROMIDE 10 MG/ML (PF) SYRINGE
PREFILLED_SYRINGE | INTRAVENOUS | Status: AC
  Filled 2022-02-23: qty 30

## 2022-02-23 MED ORDER — LACTATED RINGERS IV SOLN: INTRAVENOUS | Status: DC

## 2022-02-23 MED ORDER — ONDANSETRON HCL 4 MG/2ML IJ SOLN
INTRAMUSCULAR | Status: AC
  Filled 2022-02-23: qty 2

## 2022-02-23 MED ORDER — VANCOMYCIN HCL 1000 MG IV SOLR
INTRAVENOUS | Status: DC | PRN
  Administered 2022-02-23: 1000 mg

## 2022-02-23 MED ORDER — LIDOCAINE 2% (20 MG/ML) 5 ML SYRINGE
INTRAMUSCULAR | Status: DC | PRN
  Administered 2022-02-23: 60 mg via INTRAVENOUS

## 2022-02-23 MED ORDER — PHENYLEPHRINE HCL-NACL 20-0.9 MG/250ML-% IV SOLN
INTRAVENOUS | Status: DC | PRN
  Administered 2022-02-23: 25 ug/min via INTRAVENOUS

## 2022-02-23 MED ORDER — DEXAMETHASONE SODIUM PHOSPHATE 10 MG/ML IJ SOLN
INTRAMUSCULAR | Status: DC | PRN
  Administered 2022-02-23: 10 mg via INTRAVENOUS

## 2022-02-23 MED ORDER — FENTANYL CITRATE (PF) 250 MCG/5ML IJ SOLN
INTRAMUSCULAR | Status: AC
  Filled 2022-02-23: qty 5

## 2022-02-23 MED ORDER — CHLORHEXIDINE GLUCONATE 0.12 % MT SOLN
OROMUCOSAL | Status: AC
  Administered 2022-02-23: 15 mL via OROMUCOSAL
  Filled 2022-02-23: qty 15

## 2022-02-23 MED ORDER — CHLORHEXIDINE GLUCONATE 0.12 % MT SOLN: 15.0000 mL | Freq: Once | OROMUCOSAL | Status: AC

## 2022-02-23 MED ORDER — ORAL CARE MOUTH RINSE: 15.0000 mL | Freq: Once | OROMUCOSAL | Status: AC

## 2022-02-23 MED ORDER — PROPOFOL 10 MG/ML IV BOLUS
INTRAVENOUS | Status: DC | PRN
  Administered 2022-02-23: 140 mg via INTRAVENOUS

## 2022-02-23 MED ORDER — ACETAMINOPHEN 500 MG PO TABS
1000.0000 mg | ORAL_TABLET | Freq: Once | ORAL | Status: AC
  Administered 2022-02-23: 1000 mg via ORAL
  Filled 2022-02-23: qty 2

## 2022-02-23 MED ORDER — LIDOCAINE-EPINEPHRINE 1 %-1:100000 IJ SOLN
INTRAMUSCULAR | Status: AC
  Filled 2022-02-23: qty 1

## 2022-02-23 MED ORDER — LIDOCAINE-EPINEPHRINE 1 %-1:100000 IJ SOLN
INTRAMUSCULAR | Status: DC | PRN
  Administered 2022-02-23: 10 mL via INTRADERMAL

## 2022-02-23 MED ORDER — CHLORHEXIDINE GLUCONATE CLOTH 2 % EX PADS: 6.0000 | MEDICATED_PAD | Freq: Once | CUTANEOUS | Status: DC

## 2022-02-23 MED ORDER — DEXAMETHASONE SODIUM PHOSPHATE 10 MG/ML IJ SOLN
INTRAMUSCULAR | Status: AC
  Filled 2022-02-23: qty 1

## 2022-02-23 MED ORDER — CEFAZOLIN SODIUM 1 G IJ SOLR
INTRAMUSCULAR | Status: AC
  Filled 2022-02-23: qty 10

## 2022-02-23 MED ORDER — LABETALOL HCL 5 MG/ML IV SOLN
10.0000 mg | Freq: Once | INTRAVENOUS | Status: DC
  Filled 2022-02-23: qty 4

## 2022-02-23 MED ORDER — VANCOMYCIN HCL 1000 MG IV SOLR
INTRAVENOUS | Status: AC
  Filled 2022-02-23: qty 20

## 2022-02-23 MED ORDER — ONDANSETRON HCL 4 MG/2ML IJ SOLN
INTRAMUSCULAR | Status: DC | PRN
  Administered 2022-02-23: 4 mg via INTRAVENOUS

## 2022-02-23 MED ORDER — 0.9 % SODIUM CHLORIDE (POUR BTL) OPTIME
TOPICAL | Status: DC | PRN
  Administered 2022-02-23: 1000 mL

## 2022-02-23 MED ORDER — PROPOFOL 10 MG/ML IV BOLUS
INTRAVENOUS | Status: AC
  Filled 2022-02-23: qty 20

## 2022-02-23 MED ORDER — PHENYLEPHRINE 80 MCG/ML (10ML) SYRINGE FOR IV PUSH (FOR BLOOD PRESSURE SUPPORT)
PREFILLED_SYRINGE | INTRAVENOUS | Status: AC
  Filled 2022-02-23: qty 20

## 2022-02-23 SURGICAL SUPPLY — 37 items
BAG COUNTER SPONGE SURGICOUNT (BAG) ×1 IMPLANT
CANISTER SUCT 3000ML PPV (MISCELLANEOUS) ×1 IMPLANT
DERMABOND ADVANCED .7 DNX12 (GAUZE/BANDAGES/DRESSINGS) ×1 IMPLANT
DRAPE CAMERA VIDEO/LASER (DRAPES) ×1 IMPLANT
DRAPE LAPAROTOMY 100X72 PEDS (DRAPES) ×1 IMPLANT
DRSG OPSITE POSTOP 4X6 (GAUZE/BANDAGES/DRESSINGS) IMPLANT
DURAPREP 26ML APPLICATOR (WOUND CARE) ×1 IMPLANT
ELECT COATED BLADE 2.86 ST (ELECTRODE) ×1 IMPLANT
GAUZE 4X4 16PLY ~~LOC~~+RFID DBL (SPONGE) IMPLANT
GLOVE BIO SURGEON STRL SZ8 (GLOVE) ×1 IMPLANT
GLOVE BIOGEL PI IND STRL 8 (GLOVE) ×1 IMPLANT
GLOVE BIOGEL PI IND STRL 8.5 (GLOVE) ×1 IMPLANT
GLOVE ECLIPSE 7.0 STRL STRAW (GLOVE) IMPLANT
GLOVE ECLIPSE 8.0 STRL XLNG CF (GLOVE) ×1 IMPLANT
GLOVE EXAM NITRILE XL STR (GLOVE) IMPLANT
GOWN STRL REUS W/ TWL LRG LVL3 (GOWN DISPOSABLE) IMPLANT
GOWN STRL REUS W/ TWL XL LVL3 (GOWN DISPOSABLE) ×2 IMPLANT
GOWN STRL REUS W/TWL 2XL LVL3 (GOWN DISPOSABLE) ×1 IMPLANT
GOWN STRL REUS W/TWL LRG LVL3 (GOWN DISPOSABLE)
GOWN STRL REUS W/TWL XL LVL3 (GOWN DISPOSABLE) ×3
KIT BASIN OR (CUSTOM PROCEDURE TRAY) ×1 IMPLANT
KIT GENUS R16 IPG UPGRADE (Neuro Prosthesis/Implant) IMPLANT
KIT TURNOVER KIT B (KITS) ×1 IMPLANT
MARKER SKIN DUAL TIP RULER LAB (MISCELLANEOUS) ×1 IMPLANT
NDL HYPO 25X1 1.5 SAFETY (NEEDLE) ×1 IMPLANT
NEEDLE HYPO 25X1 1.5 SAFETY (NEEDLE) ×1 IMPLANT
NS IRRIG 1000ML POUR BTL (IV SOLUTION) ×1 IMPLANT
PACK LAMINECTOMY NEURO (CUSTOM PROCEDURE TRAY) ×1 IMPLANT
PAD ARMBOARD 7.5X6 YLW CONV (MISCELLANEOUS) ×3 IMPLANT
SPIKE FLUID TRANSFER (MISCELLANEOUS) ×1 IMPLANT
SUT MNCRL AB 3-0 PS2 18 (SUTURE) IMPLANT
SUT PROLENE 3 0 PS 1 (SUTURE) IMPLANT
SUT VIC AB 2-0 CP2 18 (SUTURE) ×1 IMPLANT
SUT VIC AB 3-0 SH 8-18 (SUTURE) ×1 IMPLANT
TOWEL GREEN STERILE (TOWEL DISPOSABLE) ×1 IMPLANT
TOWEL GREEN STERILE FF (TOWEL DISPOSABLE) ×1 IMPLANT
WATER STERILE IRR 1000ML POUR (IV SOLUTION) ×1 IMPLANT

## 2022-02-23 NOTE — Transfer of Care (Signed)
Immediate Anesthesia Transfer of Care Note  Patient: Patrick Ross  Procedure(s) Performed: CHANGE IPG BATTERY, RT CHEST (Right)  Patient Location: PACU  Anesthesia Type:General  Level of Consciousness: awake and alert   Airway & Oxygen Therapy: Patient Spontanous Breathing and Patient connected to face mask oxygen  Post-op Assessment: Report given to RN and Post -op Vital signs reviewed and stable  Post vital signs: Reviewed and stable  Last Vitals:  Vitals Value Taken Time  BP 154/92 02/23/22 1408  Temp    Pulse 83 02/23/22 1410  Resp 16 02/23/22 1410  SpO2 98 % 02/23/22 1410  Vitals shown include unvalidated device data.  Last Pain:  Vitals:   02/23/22 1106  TempSrc:   PainSc: 1          Complications: No notable events documented.

## 2022-02-23 NOTE — Op Note (Signed)
   Providing Compassionate, Quality Care - Together  Date of service: 02/23/2022  PREOP DIAGNOSIS:  Right chest IPG, end of battery life for DBS  POSTOP DIAGNOSIS: Same  PROCEDURE: Right chest IPG replacement for end of battery life for DBS, removal of Medtronic, placement of [employer] vercise Genus model [id number] (with placement of 15 cm adapter vercise)  SURGEON: Dr. Pieter Partridge C. Necha Harries, DO  ASSISTANT: None  ANESTHESIA: General Endotracheal  EBL: Minimal  SPECIMENS: None  DRAINS: None  COMPLICATIONS: None  CONDITION: Hemodynamically stable  HISTORY: Patrick Ross is a 76 y.o. male with a history of a right DBS was at the end of battery life.  He elected undergo replacement to a Scientist, research (life sciences) IPG.  All risks benefits and expected outcomes were discussed and agreed upon, informed consent was obtained and witnessed.  PROCEDURE IN DETAIL: The patient was brought to the operating room. After induction of general anesthesia, the patient was positioned on the operative table in the supine position. All pressure points were meticulously padded. Skin incision was then marked out and prepped and draped in the usual sterile fashion. Physician driven timeout was performed.  Local anesthetic was injected into the planned incision.  Using 15 blade, prior incision was opened sharply along the right anterior chest.  Sharp dissection was performed down to the IPG.  Using Bovie electrocautery, this was gently extricated from the pocket.  The wire was identified and protected.  This was removed and disconnected from the IPG, and then connected to the adapter wire, and then connected to the new IPG.  Impedances were checked and noted to be minimal and stable.  The adapter and new IPG was final tightened to the manufacturer's recommendation.  The adapter and remaining wire was placed deep in the pocket, the IPG was placed back in its original position.  This was secured using 3-0  Prolene suture.  Vancomycin powder was placed in the wound.  The wound was noted to be excellently hemostatic.  We then closed in layers, pocket layer with 2-0 Vicryl suture.  Dermis was closed with 2-0 and 3-0 Vicryl suture.  Skin was closed with skin glue.  Impedances were checked again and noted to be stable.  At the end of the case all sponge, needle, and instrument counts were correct. The patient was then transferred to the stretcher, extubated, and taken to the post-anesthesia care unit in stable hemodynamic condition.

## 2022-02-23 NOTE — Anesthesia Procedure Notes (Signed)
Procedure Name: LMA Insertion Date/Time: 02/23/2022 1:10 PM  Performed by: Bryson Corona, CRNAPre-anesthesia Checklist: Patient identified, Emergency Drugs available, Suction available and Patient being monitored Patient Re-evaluated:Patient Re-evaluated prior to induction Oxygen Delivery Method: Circle System Utilized Preoxygenation: Pre-oxygenation with 100% oxygen Induction Type: IV induction Ventilation: Mask ventilation without difficulty LMA: LMA inserted LMA Size: 4.0 Number of attempts: 1 Placement Confirmation: positive ETCO2 Tube secured with: Tape Dental Injury: Teeth and Oropharynx as per pre-operative assessment

## 2022-02-23 NOTE — Progress Notes (Signed)
BP 179/110, 171/101 in pre-op. Heart rate in 80s. Pt asymptomatic. Dr. Deatra Canter notified and new order for Labetalol IV. Pt BP rechecked prior to administration 165/96. Pt wife states that he did take his Lisinopril this morning. Pt reports BP at home normally 120-130/80s. Dr. Ola Spurr notified and ordered to hold labetalol. Patient and wife verbalized understanding.

## 2022-02-23 NOTE — H&P (Signed)
Providing Compassionate, Quality Care - Together  NEUROSURGERY HISTORY & PHYSICAL   Patrick Ross is an 76 y.o. male.   Chief Complaint: DBS end of battery life HPI: This is a 76 year old male with a history of right-sided DBS, with end of battery life.  He presents today for surgical intervention for change of his right chest IPG.  We are converting his system from Medtronic to [employer].  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  He has no complaints at this time.  Is at his neurologic baseline.  His family is with him at bedside.  Past Medical History:  Diagnosis Date   Depression    DVT (deep venous thrombosis) (HCC)    GERD (gastroesophageal reflux disease)    History of kidney stones    passed   Hypertension    Hypothyroidism    Lumbar stenosis    OSA (obstructive sleep apnea)    wears cpap   Parkinson's disease    PONV (postoperative nausea and vomiting)    REM behavioral disorder    Urinary incontinence     Past Surgical History:  Procedure Laterality Date   BACK SURGERY     x 2 - not fusion   COLONOSCOPY     DEEP BRAIN STIMULATOR PLACEMENT     left 12/27/2012, right 12/10/2011, left revision 02/02/2013, replacement bilateral 11/21/2015   MINOR PLACEMENT OF FIDUCIAL N/A 01/09/2019   Procedure: Fiducial placement;  Surgeon: Erline Levine, MD;  Location: [facility];  Service: Neurosurgery;  Laterality: N/A;  Fiducial placement   PARTIAL NEPHRECTOMY Right 2005 ish   PULSE GENERATOR IMPLANT Right 01/23/2019   Procedure: Right chest implantable pulse generator change;  Surgeon: Erline Levine, MD;  Location: [facility];  Service: Neurosurgery;  Laterality: Right;   SPINAL FUSION     lumbar Fusion x 2   SUBTHALAMIC STIMULATOR BATTERY REPLACEMENT Left 02/02/2018   Procedure: Change implantable pulse generator battery, Left chest;  Surgeon: Erline Levine, MD;  Location: [facility];  Service: Neurosurgery;  Laterality: Left;   SUBTHALAMIC STIMULATOR BATTERY REPLACEMENT N/A  01/22/2020   Procedure: REMOVAL OF LEFT BRAIN DEEP BRAIN STIMULATOR ELECTRODE, EXTENSION, AND PULSE GENERATOR;  Surgeon: Erline Levine, MD;  Location: [facility];  Service: Neurosurgery;  Laterality: N/A;   SUBTHALAMIC STIMULATOR INSERTION Left 01/16/2019   Procedure: Left deep brain stimulator electrode revision.;  Surgeon: Erline Levine, MD;  Location: [facility];  Service: Neurosurgery;  Laterality: Left;   SUBTHALAMIC STIMULATOR INSERTION Left 01/23/2019   Procedure: Repositioning of Left Deep Brain Stimulator Electrode;  Surgeon: Erline Levine, MD;  Location: [facility];  Service: Neurosurgery;  Laterality: Left;   TONSILLECTOMY      Family History  Problem Relation Age of Onset   AAA (abdominal aortic aneurysm) Mother        86 yo   Heart disease Father    Diabetes Father    Diabetes Brother    Healthy Child    Social History:  reports that he has never smoked. He has never used smokeless tobacco. He reports that he does not currently use alcohol. He reports that he does not use drugs.  Allergies:  Allergies  Allergen Reactions   Other Other (See Comments)    Surgical spray prior to adhesive causes blisters   Sulfamethoxazole Other (See Comments)    Unsure--childhood allergy    Medications Prior to Admission  Medication Sig Dispense Refill   amantadine (SYMMETREL) 100 MG capsule TAKE 1 CAPSULE TWICE A DAY (Patient taking differently:  Take 100 mg by mouth 2 (two) times daily. (0700 & 1430)) 180 capsule 0   Bacillus Coagulans-Inulin (PROBIOTIC-PREBIOTIC PO) Take 1 capsule by mouth in the morning. Doterra PB Assist+ Probiotic Defense Formula     carbidopa-levodopa (SINEMET IR) 25-100 MG tablet 2 at 7am, 2 at 9:30am, 2 at noon, 2 at 2:30 pm, 2 at 5pm.  If you need an extra late evening, you may take 1 extra (Patient taking differently: Take 2 tablets by mouth 5 (five) times daily. 2 at 7am, 2 at 9:30am, 2 at noon, 2 at 2:30 pm, 2 at 5pm.  If you need an extra late evening, you may take 1 extra)  915 tablet 0   clonazePAM (KLONOPIN) 0.5 MG tablet TAKE 1 AND 1/2 TABLETS BY MOUTH AT BEDTIME 135 tablet 1   cycloSPORINE (RESTASIS) 0.05 % ophthalmic emulsion Place 1 drop into both eyes 2 (two) times daily.     DIGESTIVE ENZYMES PO Take 1 capsule by mouth daily with lunch. Doterra TerraZyme Digestive Enzyme Comple     escitalopram (LEXAPRO) 20 MG tablet Take 1 tablet (20 mg total) by mouth daily. (Patient taking differently: Take 20 mg by mouth at bedtime.) 270 tablet 2   famotidine (PEPCID) 20 MG tablet Take 20 mg by mouth 2 (two) times daily. (0930 & 1700)     Levodopa (INBRIJA) 42 MG CAPS ORALLY INHALE CONTENTS OF 2 CAPSULES AS NEEDED FOR SYMPTOMS OF AN OFF PERIOD (DO NOT EXCEED 5 DOSES IN 1 DAY) (Patient taking differently: Place 84 mg into inhaler and inhale 2 (two) times daily as needed (symptoms). ORALLY INHALE CONTENTS OF 2 CAPSULES AS NEEDED FOR SYMPTOMS OF AN OFF PERIOD (DO NOT EXCEED 5 DOSES IN 1 DAY)) 300 capsule 0   levothyroxine (SYNTHROID) 125 MCG tablet Take 125 mcg by mouth daily before breakfast. (0700)     lisinopril (ZESTRIL) 40 MG tablet Take 40 mg by mouth in the morning.     mirabegron ER (MYRBETRIQ) 50 MG TB24 tablet Take 50 mg by mouth at bedtime.     Multiple Minerals-Vitamins (BONE ESSENTIALS) CAPS Take 1 capsule by mouth in the morning and at bedtime.  doTERRA Women Bone Nutrient Essential Complex     Multiple Vitamins-Minerals (CELLULAR SECURITY PO) Take 2 capsules by mouth in the morning and at bedtime. doTERRA Alpha CRS+ (In the morning & at lunch)     Multiple Vitamins-Minerals (IMMUNE SUPPORT PO) Take 1 capsule by mouth in the morning. doTERRA On Guard Protective Blend     Omega-3 Fatty Acids (OMEGA 3 PO) Take 2 capsules by mouth in the morning and at bedtime. Doterra xEO Mega (In the morning & at lunch)     OVER THE COUNTER MEDICATION Take 2 capsules by mouth 2 (two) times daily. Microplex VMz otc supplement     rosuvastatin (CRESTOR) 20 MG tablet Take 20 mg by  mouth daily at 12 noon.     AMBULATORY NON FORMULARY MEDICATION Lift chair Dx:  G20 (Patient taking differently: Lift chair Dx:  G20) 1 Device 0    Results for orders placed or performed during the hospital encounter of 02/23/22 (from the past 48 hour(s))  Basic metabolic panel per protocol     Status: Abnormal   Collection Time: 02/23/22 11:10 AM  Result Value Ref Range   Sodium 142 135 - 145 mmol/L   Potassium 4.3 3.5 - 5.1 mmol/L   Chloride 109 98 - 111 mmol/L   CO2 27 22 - 32 mmol/L  Glucose, Bld 99 70 - 99 mg/dL    Comment: Glucose reference range applies only to samples taken after fasting for at least 8 hours.   BUN 17 8 - 23 mg/dL   Creatinine, Ser 0.87 0.61 - 1.24 mg/dL   Calcium 8.8 (L) 8.9 - 10.3 mg/dL   GFR, Estimated >60 >60 mL/min    Comment: (NOTE) Calculated using the CKD-EPI Creatinine Equation (2021)    Anion gap 6 5 - 15    Comment: Performed at [facility] [address] [place], [place] [address]  CBC per protocol     Status: None   Collection Time: 02/23/22 11:10 AM  Result Value Ref Range   WBC 7.4 4.0 - 10.5 K/uL   RBC 4.85 4.22 - 5.81 MIL/uL   Hemoglobin 15.4 13.0 - 17.0 g/dL   HCT 45.6 39.0 - 52.0 %   MCV 94.0 80.0 - 100.0 fL   MCH 31.8 26.0 - 34.0 pg   MCHC 33.8 30.0 - 36.0 g/dL   RDW 12.2 11.5 - 15.5 %   Platelets 151 150 - 400 K/uL   nRBC 0.0 0.0 - 0.2 %    Comment: Performed at [facility], [place] [address], [place], [place] [address]   No results found.  ROS All pertinent positives and negatives are listed HPI above  Blood pressure (!) 165/96, pulse 87, temperature 97.9 F (36.6 C), temperature source Oral, resp. rate 16, height '6\' 2"'$  (1.88 m), weight 106.1 kg, SpO2 97 %. Physical Exam  Awake alert oriented x 3, no acute distress PERRLA Speech fluent but slow Moves all extremities equally Nonlabored breathing  Assessment/Plan 76 year old male with  Right chest IPG end of battery life -OR today for change of right  IPG.  We discussed all risks benefits expected outcomes as well as alternatives to treatment.  Informed consent was obtained and witnessed.  I answered all of his questions.   Thank you for allowing me to participate in this patient's care.  Please do not hesitate to call with questions or concerns.   Elwin Sleight, [facility] Cell: [phone]

## 2022-02-24 NOTE — Anesthesia Postprocedure Evaluation (Signed)
Anesthesia Post Note  Patient: Patrick Ross  Procedure(s) Performed: CHANGE IPG BATTERY, RT CHEST (Right)     Patient location during evaluation: PACU Anesthesia Type: General Level of consciousness: awake and alert Pain management: pain level controlled Vital Signs Assessment: post-procedure vital signs reviewed and stable Respiratory status: spontaneous breathing, nonlabored ventilation, respiratory function stable and patient connected to nasal cannula oxygen Cardiovascular status: blood pressure returned to baseline and stable Postop Assessment: no apparent nausea or vomiting Anesthetic complications: no   No notable events documented.  Last Vitals:  Vitals:   02/23/22 1430 02/23/22 1445  BP: (!) 141/82 (!) 156/95  Pulse: 82 84  Resp: 19 14  Temp:    SpO2: 97% 97%    Last Pain:  Vitals:   02/23/22 1430  TempSrc:   PainSc: 0-No pain                 Tiajuana Amass

## 2022-02-25 ENCOUNTER — Encounter (HOSPITAL_COMMUNITY): Payer: Self-pay | Admitting: Neurological Surgery

## 2022-03-03 ENCOUNTER — Other Ambulatory Visit: Payer: Self-pay

## 2022-03-03 ENCOUNTER — Telehealth: Payer: Self-pay | Admitting: Neurology

## 2022-03-03 DIAGNOSIS — F330 Major depressive disorder, recurrent, mild: Secondary | ICD-10-CM

## 2022-03-03 NOTE — Telephone Encounter (Signed)
Pt wants to have his Klonopin called into the [facility] on [address]

## 2022-03-03 NOTE — Telephone Encounter (Signed)
Called patients wife and let her know we were waiting for approval to send

## 2022-03-04 MED ORDER — CLONAZEPAM 0.5 MG PO TABS: ORAL_TABLET | ORAL | 1 refills | Status: DC

## 2022-03-04 NOTE — Telephone Encounter (Signed)
Pt wants to have his Klonopin called into the [facility] on [address]   Talked with wife yesterday and she confirmed this location

## 2022-03-22 ENCOUNTER — Other Ambulatory Visit: Payer: Self-pay | Admitting: Neurology

## 2022-04-16 ENCOUNTER — Other Ambulatory Visit: Payer: Self-pay

## 2022-04-16 ENCOUNTER — Telehealth: Payer: Self-pay | Admitting: Neurology

## 2022-04-16 DIAGNOSIS — G20A1 Parkinson's disease without dyskinesia, without mention of fluctuations: Secondary | ICD-10-CM

## 2022-04-16 MED ORDER — CARBIDOPA-LEVODOPA 25-100 MG PO TABS: ORAL_TABLET | ORAL | 0 refills | Status: DC

## 2022-04-16 NOTE — Telephone Encounter (Signed)
1. Which medications need refilled? (List name and dosage, if known) carbidopa-levodopa  2. Which pharmacy/location is medication to be sent to? (include street and city if local pharmacy) [facility]

## 2022-04-16 NOTE — Telephone Encounter (Signed)
Refill sent in for pt. 

## 2022-05-30 ENCOUNTER — Encounter (HOSPITAL_BASED_OUTPATIENT_CLINIC_OR_DEPARTMENT_OTHER): Payer: Self-pay

## 2022-05-30 ENCOUNTER — Other Ambulatory Visit: Payer: Self-pay

## 2022-05-30 ENCOUNTER — Emergency Department (HOSPITAL_BASED_OUTPATIENT_CLINIC_OR_DEPARTMENT_OTHER): Payer: Medicare Other

## 2022-05-30 ENCOUNTER — Inpatient Hospital Stay (HOSPITAL_BASED_OUTPATIENT_CLINIC_OR_DEPARTMENT_OTHER)
Admission: EM | Admit: 2022-05-30 | Discharge: 2022-06-01 | DRG: 378 | Disposition: A | Discharge status: Home / Self Care | Payer: Medicare Other | Attending: Internal Medicine | Admitting: Internal Medicine

## 2022-05-30 DIAGNOSIS — F32A Depression, unspecified: Secondary | ICD-10-CM | POA: Diagnosis not present

## 2022-05-30 DIAGNOSIS — E876 Hypokalemia: Secondary | ICD-10-CM | POA: Diagnosis not present

## 2022-05-30 DIAGNOSIS — K2100 Gastro-esophageal reflux disease with esophagitis, without bleeding: Secondary | ICD-10-CM | POA: Diagnosis not present

## 2022-05-30 DIAGNOSIS — K921 Melena: Secondary | ICD-10-CM | POA: Insufficient documentation

## 2022-05-30 DIAGNOSIS — Z981 Arthrodesis status: Secondary | ICD-10-CM

## 2022-05-30 DIAGNOSIS — N3000 Acute cystitis without hematuria: Secondary | ICD-10-CM | POA: Diagnosis not present

## 2022-05-30 DIAGNOSIS — Z833 Family history of diabetes mellitus: Secondary | ICD-10-CM | POA: Diagnosis not present

## 2022-05-30 DIAGNOSIS — I10 Essential (primary) hypertension: Secondary | ICD-10-CM | POA: Diagnosis not present

## 2022-05-30 DIAGNOSIS — I2510 Atherosclerotic heart disease of native coronary artery without angina pectoris: Secondary | ICD-10-CM | POA: Diagnosis present

## 2022-05-30 DIAGNOSIS — G4733 Obstructive sleep apnea (adult) (pediatric): Secondary | ICD-10-CM | POA: Diagnosis present

## 2022-05-30 DIAGNOSIS — R1010 Upper abdominal pain, unspecified: Secondary | ICD-10-CM

## 2022-05-30 DIAGNOSIS — Z79890 Hormone replacement therapy (postmenopausal): Secondary | ICD-10-CM | POA: Diagnosis not present

## 2022-05-30 DIAGNOSIS — Z79899 Other long term (current) drug therapy: Secondary | ICD-10-CM | POA: Diagnosis not present

## 2022-05-30 DIAGNOSIS — I700 Atherosclerosis of aorta: Secondary | ICD-10-CM | POA: Diagnosis not present

## 2022-05-30 DIAGNOSIS — R54 Age-related physical debility: Secondary | ICD-10-CM | POA: Diagnosis not present

## 2022-05-30 DIAGNOSIS — K449 Diaphragmatic hernia without obstruction or gangrene: Secondary | ICD-10-CM | POA: Diagnosis not present

## 2022-05-30 DIAGNOSIS — G4752 REM sleep behavior disorder: Secondary | ICD-10-CM | POA: Diagnosis not present

## 2022-05-30 DIAGNOSIS — Z905 Acquired absence of kidney: Secondary | ICD-10-CM

## 2022-05-30 DIAGNOSIS — B9620 Unspecified Escherichia coli [E. coli] as the cause of diseases classified elsewhere: Secondary | ICD-10-CM | POA: Diagnosis not present

## 2022-05-30 DIAGNOSIS — Z8249 Family history of ischemic heart disease and other diseases of the circulatory system: Secondary | ICD-10-CM

## 2022-05-30 DIAGNOSIS — E039 Hypothyroidism, unspecified: Secondary | ICD-10-CM | POA: Diagnosis not present

## 2022-05-30 DIAGNOSIS — K2901 Acute gastritis with bleeding: Secondary | ICD-10-CM | POA: Diagnosis not present

## 2022-05-30 DIAGNOSIS — K5900 Constipation, unspecified: Secondary | ICD-10-CM | POA: Diagnosis not present

## 2022-05-30 DIAGNOSIS — Z86718 Personal history of other venous thrombosis and embolism: Secondary | ICD-10-CM | POA: Diagnosis not present

## 2022-05-30 DIAGNOSIS — K4090 Unilateral inguinal hernia, without obstruction or gangrene, not specified as recurrent: Secondary | ICD-10-CM | POA: Diagnosis present

## 2022-05-30 DIAGNOSIS — R531 Weakness: Principal | ICD-10-CM

## 2022-05-30 DIAGNOSIS — K922 Gastrointestinal hemorrhage, unspecified: Secondary | ICD-10-CM

## 2022-05-30 DIAGNOSIS — G20A1 Parkinson's disease without dyskinesia, without mention of fluctuations: Secondary | ICD-10-CM | POA: Diagnosis present

## 2022-05-30 DIAGNOSIS — R109 Unspecified abdominal pain: Secondary | ICD-10-CM | POA: Diagnosis present

## 2022-05-30 DIAGNOSIS — R8290 Unspecified abnormal findings in urine: Secondary | ICD-10-CM | POA: Diagnosis present

## 2022-05-30 LAB — URINALYSIS, ROUTINE W REFLEX MICROSCOPIC
Bilirubin Urine: NEGATIVE
Glucose, UA: NEGATIVE mg/dL
Hgb urine dipstick: NEGATIVE
Ketones, ur: NEGATIVE mg/dL
Nitrite: POSITIVE — AB
Protein, ur: 30 mg/dL — AB
Specific Gravity, Urine: 1.015 (ref 1.005–1.030)
pH: 5.5 (ref 5.0–8.0)

## 2022-05-30 LAB — CBC WITH DIFFERENTIAL/PLATELET
Abs Immature Granulocytes: 0.01 10*3/uL (ref 0.00–0.07)
Basophils Absolute: 0 10*3/uL (ref 0.0–0.1)
Basophils Relative: 0 %
Eosinophils Absolute: 0 10*3/uL (ref 0.0–0.5)
Eosinophils Relative: 0 %
HCT: 40.9 % (ref 39.0–52.0)
Hemoglobin: 13.9 g/dL (ref 13.0–17.0)
Immature Granulocytes: 0 %
Lymphocytes Relative: 8 %
Lymphs Abs: 0.4 10*3/uL — ABNORMAL LOW (ref 0.7–4.0)
MCH: 31.1 pg (ref 26.0–34.0)
MCHC: 34 g/dL (ref 30.0–36.0)
MCV: 91.5 fL (ref 80.0–100.0)
Monocytes Absolute: 0.4 10*3/uL (ref 0.1–1.0)
Monocytes Relative: 7 %
Neutro Abs: 4.3 10*3/uL (ref 1.7–7.7)
Neutrophils Relative %: 85 %
Platelets: 191 10*3/uL (ref 150–400)
RBC: 4.47 MIL/uL (ref 4.22–5.81)
RDW: 12.6 % (ref 11.5–15.5)
WBC: 5.1 10*3/uL (ref 4.0–10.5)
nRBC: 0 % (ref 0.0–0.2)

## 2022-05-30 LAB — COMPREHENSIVE METABOLIC PANEL
ALT: 7 U/L (ref 0–44)
AST: 16 U/L (ref 15–41)
Albumin: 3.4 g/dL — ABNORMAL LOW (ref 3.5–5.0)
Alkaline Phosphatase: 62 U/L (ref 38–126)
Anion gap: 10 (ref 5–15)
BUN: 43 mg/dL — ABNORMAL HIGH (ref 8–23)
CO2: 21 mmol/L — ABNORMAL LOW (ref 22–32)
Calcium: 7.7 mg/dL — ABNORMAL LOW (ref 8.9–10.3)
Chloride: 103 mmol/L (ref 98–111)
Creatinine, Ser: 1.2 mg/dL (ref 0.61–1.24)
GFR, Estimated: >60 mL/min (ref >60)
Glucose, Bld: 120 mg/dL — ABNORMAL HIGH (ref 70–99)
Potassium: 3.3 mmol/L — ABNORMAL LOW (ref 3.5–5.1)
Sodium: 134 mmol/L — ABNORMAL LOW (ref 135–145)
Total Bilirubin: 1.1 mg/dL (ref 0.3–1.2)
Total Protein: 6.5 g/dL (ref 6.5–8.1)

## 2022-05-30 LAB — URINALYSIS, MICROSCOPIC (REFLEX)
RBC / HPF: NONE SEEN RBC/hpf (ref 0–5)
WBC, UA: >50 WBC/hpf (ref 0–5)

## 2022-05-30 LAB — LACTIC ACID, PLASMA
Lactic Acid, Venous: 1.6 mmol/L (ref 0.5–1.9)
Lactic Acid, Venous: 1.3 mmol/L (ref 0.5–1.9)

## 2022-05-30 LAB — OCCULT BLOOD X 1 CARD TO LAB, STOOL: Fecal Occult Bld: POSITIVE — AB

## 2022-05-30 LAB — TROPONIN I (HIGH SENSITIVITY)
Troponin I (High Sensitivity): 16 ng/L (ref <18)
Troponin I (High Sensitivity): 15 ng/L (ref <18)

## 2022-05-30 LAB — LIPASE, BLOOD: Lipase: 23 U/L (ref 11–51)

## 2022-05-30 MED ORDER — LEVOTHYROXINE SODIUM 125 MCG PO TABS
125.0000 ug | ORAL_TABLET | Freq: Every day | ORAL | Status: DC
  Administered 2022-05-31: 125 ug via ORAL
  Filled 2022-05-30: qty 1

## 2022-05-30 MED ORDER — LISINOPRIL 20 MG PO TABS
40.0000 mg | ORAL_TABLET | Freq: Every day | ORAL | Status: DC
  Administered 2022-05-31 – 2022-06-01 (×2): 40 mg via ORAL
  Filled 2022-05-30 (×3): qty 2

## 2022-05-30 MED ORDER — SODIUM CHLORIDE 0.9 % IV SOLN: INTRAVENOUS | Status: AC

## 2022-05-30 MED ORDER — ESCITALOPRAM OXALATE 20 MG PO TABS
20.0000 mg | ORAL_TABLET | Freq: Every day | ORAL | Status: DC
  Administered 2022-05-30 – 2022-05-31 (×2): 20 mg via ORAL
  Filled 2022-05-30 (×2): qty 1

## 2022-05-30 MED ORDER — PANTOPRAZOLE SODIUM 40 MG IV SOLR
40.0000 mg | Freq: Two times a day (BID) | INTRAVENOUS | Status: DC
  Administered 2022-05-30 – 2022-05-31 (×3): 40 mg via INTRAVENOUS
  Filled 2022-05-30 (×4): qty 10

## 2022-05-30 MED ORDER — ACETAMINOPHEN 325 MG PO TABS: 650.0000 mg | ORAL_TABLET | Freq: Four times a day (QID) | ORAL | Status: DC | PRN

## 2022-05-30 MED ORDER — ONDANSETRON HCL 4 MG/2ML IJ SOLN
4.0000 mg | Freq: Once | INTRAMUSCULAR | Status: AC
  Administered 2022-05-30: 4 mg via INTRAVENOUS
  Filled 2022-05-30: qty 2

## 2022-05-30 MED ORDER — ACETAMINOPHEN 650 MG RE SUPP: 650.0000 mg | Freq: Four times a day (QID) | RECTAL | Status: DC | PRN

## 2022-05-30 MED ORDER — IOHEXOL 350 MG/ML SOLN
100.0000 mL | Freq: Once | INTRAVENOUS | Status: AC | PRN
  Administered 2022-05-30: 100 mL via INTRAVENOUS

## 2022-05-30 MED ORDER — POTASSIUM CHLORIDE 20 MEQ PO PACK
40.0000 meq | PACK | Freq: Once | ORAL | Status: AC
  Administered 2022-05-30: 40 meq via ORAL
  Filled 2022-05-30: qty 2

## 2022-05-30 MED ORDER — CLONAZEPAM 0.5 MG PO TABS: 0.5000 mg | ORAL_TABLET | Freq: Every day | ORAL | Status: DC

## 2022-05-30 MED ORDER — SODIUM CHLORIDE 0.9 % IV SOLN
1.0000 g | INTRAVENOUS | Status: DC
  Administered 2022-05-31 – 2022-06-01 (×2): 1 g via INTRAVENOUS
  Filled 2022-05-30 (×3): qty 10

## 2022-05-30 MED ORDER — SODIUM CHLORIDE 0.9 % IV BOLUS
1000.0000 mL | Freq: Once | INTRAVENOUS | Status: AC
  Administered 2022-05-30: 1000 mL via INTRAVENOUS

## 2022-05-30 MED ORDER — ROSUVASTATIN CALCIUM 20 MG PO TABS
20.0000 mg | ORAL_TABLET | Freq: Every day | ORAL | Status: DC
  Administered 2022-05-31: 20 mg via ORAL
  Filled 2022-05-30 (×2): qty 1

## 2022-05-30 MED ORDER — SODIUM CHLORIDE 0.9 % IV SOLN
1.0000 g | Freq: Once | INTRAVENOUS | Status: AC
  Administered 2022-05-30: 1 g via INTRAVENOUS
  Filled 2022-05-30: qty 10

## 2022-05-30 MED ORDER — ONDANSETRON HCL 4 MG/2ML IJ SOLN: 4.0000 mg | Freq: Four times a day (QID) | INTRAMUSCULAR | Status: DC | PRN

## 2022-05-30 MED ORDER — CLONAZEPAM 0.5 MG PO TABS
0.5000 mg | ORAL_TABLET | Freq: Once | ORAL | Status: AC
  Administered 2022-05-30: 0.5 mg via ORAL
  Filled 2022-05-30: qty 1

## 2022-05-30 MED ORDER — CARBIDOPA-LEVODOPA 25-100 MG PO TABS
2.0000 | ORAL_TABLET | ORAL | Status: DC
  Administered 2022-05-31 (×2): 2 via ORAL
  Filled 2022-05-30 (×2): qty 2

## 2022-05-30 MED ORDER — CALCIUM GLUCONATE-NACL 1-0.675 GM/50ML-% IV SOLN
1.0000 g | Freq: Once | INTRAVENOUS | Status: AC
  Administered 2022-05-30: 1000 mg via INTRAVENOUS
  Filled 2022-05-30: qty 50

## 2022-05-30 MED ORDER — MIRABEGRON ER 25 MG PO TB24
50.0000 mg | ORAL_TABLET | Freq: Every day | ORAL | Status: DC
  Administered 2022-05-30 – 2022-05-31 (×2): 50 mg via ORAL
  Filled 2022-05-30 (×3): qty 2

## 2022-05-30 MED ORDER — AMANTADINE HCL 100 MG PO CAPS
100.0000 mg | ORAL_CAPSULE | Freq: Two times a day (BID) | ORAL | Status: DC
  Administered 2022-05-30 – 2022-06-01 (×4): 100 mg via ORAL
  Filled 2022-05-30 (×5): qty 1

## 2022-05-30 MED ORDER — FLEET ENEMA 7-19 GM/118ML RE ENEM
1.0000 | ENEMA | Freq: Once | RECTAL | Status: DC
  Filled 2022-05-30: qty 1

## 2022-05-30 NOTE — ED Triage Notes (Signed)
Pt accompanied by wife.Reports poor appetite, weakness and burning abdominal pain since yesterday  Episode of nausea this am and coughed up dark colored mucous. Wife has specimen in a bag. Pt reports dizziness.

## 2022-05-30 NOTE — Plan of Care (Signed)
  Problem: Elimination: Goal: Will not experience complications related to urinary retention Outcome: Progressing   Problem: Pain Managment: Goal: General experience of comfort will improve Outcome: Progressing   

## 2022-05-30 NOTE — Care Plan (Signed)
Patient with a history of Parkinson's disease with deep brain stimulator, hypertension, GERD, incontinence, kidney stones, previous DVT no longer on anticoagulation presenting with lower abdominal pain worsening since last night along with generalized weakness, nausea and poor appetite. Patient is unable to do his ADLs due to worsening weakness Concern of UTI.  Patient received 2 L of IV fluid and a dose of Rocephin in ED.  Is being admitted under observation.

## 2022-05-30 NOTE — ED Notes (Signed)
Pt request to delay enema, as he is getting a visit from his pastor.

## 2022-05-30 NOTE — H&P (Signed)
History and Physical    Patrick Ross [id number] DOB: 1945-06-08 DOA: 05/30/2022  PCP: Jefm Petty, MD  Patient coming from: Home  I have personally briefly reviewed patient's old medical records in [place]  Chief Complaint: Nausea, weakness  HPI: Patrick Ross is a 77 y.o. male with medical history significant for Parkinson's disease, s/p neurostimulator implant right chest, HTN, hypothyroidism, depression, OSA on CPAP who presented to the ED for evaluation of generalized weakness, nausea, poor appetite.  Patient reports developing generalized abdominal pain and burning sensation into upper chest yesterday.  He has had nausea but no vomiting.  He did spit up black-colored sputum.  He reports issues with constipation with last bowel movement last night.  He has not seen any hematochezia or melena.  He has had decreased oral intake and generalized weakness.  He does not take any blood thinners.  He does state that he had a previous DVT in his right leg and is now off of anticoagulation.  Denies aspirin or NSAID use.  He has chronic issues with urinary incontinence.  No dysuria but does report increased frequency recently with some suprapubic discomfort.  [facility] ED Course  Labs/Imaging on admission: I have personally reviewed following labs and imaging studies.  Initial vitals showed BP 113/71, pulse 104, RR 23, temp 97.5 F, SpO2 98% on room air.  Labs show WBC 5.1, hemoglobin 13.9, platelets 191,000, sodium 134, potassium 3.3, bicarb 21, BUN 43, creatinine 1.20, calcium 7.7, albumin 3.4, LFTs within normal limits, lactic acid 1.6 > 1.3, lipase 23.  UA showed 30 protein, positive nitrates, small leukocytes, >50 WBCs, no RBCs, many bacteria on microscopy.  Urine culture in process.  FOBT positive.  2 view chest x-ray negative for focal airspace opacity.  CTA chest negative for evidence of PE or acute chest finding.  Coronary artery calcification, aortic  atherosclerosis, calcified granulomas in both lungs noted.  CT abdomen/pelvis with contrast showed small hiatal hernia, fluid in the esophagus, fluid distended stomach.  No bowel obstruction.  Moderate amount of fecal matter in the rectosigmoid noted.  Previous lumbosacral fusion L1-S1, L1 screws into the T12-L1 disc space and advanced disc degeneration at T12-L1 noted.  Small left inguinal hernia containing only fat also seen.  Patient was given 2 L normal saline, IV ceftriaxone, Zofran.  The hospitalist service was consulted to admit for further evaluation and management.  Review of Systems: All systems reviewed and are negative except as documented in history of present illness above.   Past Medical History:  Diagnosis Date   Depression    DVT (deep venous thrombosis) (HCC)    GERD (gastroesophageal reflux disease)    History of kidney stones    passed   Hypertension    Hypothyroidism    Lumbar stenosis    OSA (obstructive sleep apnea)    wears cpap   Parkinson's disease    PONV (postoperative nausea and vomiting)    REM behavioral disorder    Urinary incontinence     Past Surgical History:  Procedure Laterality Date   BACK SURGERY     x 2 - not fusion   COLONOSCOPY     DEEP BRAIN STIMULATOR PLACEMENT     left 12/27/2012, right 12/10/2011, left revision 02/02/2013, replacement bilateral 11/21/2015   MINOR PLACEMENT OF FIDUCIAL N/A 01/09/2019   Procedure: Fiducial placement;  Surgeon: Erline Levine, MD;  Location: [facility];  Service: Neurosurgery;  Laterality: N/A;  Fiducial placement   PARTIAL NEPHRECTOMY  Right 2005 ish   PULSE GENERATOR IMPLANT Right 01/23/2019   Procedure: Right chest implantable pulse generator change;  Surgeon: Erline Levine, MD;  Location: [facility];  Service: Neurosurgery;  Laterality: Right;   SPINAL FUSION     lumbar Fusion x 2   SUBTHALAMIC STIMULATOR BATTERY REPLACEMENT Left 02/02/2018   Procedure: Change implantable pulse generator battery, Left chest;   Surgeon: Erline Levine, MD;  Location: [facility];  Service: Neurosurgery;  Laterality: Left;   SUBTHALAMIC STIMULATOR BATTERY REPLACEMENT N/A 01/22/2020   Procedure: REMOVAL OF LEFT BRAIN DEEP BRAIN STIMULATOR ELECTRODE, EXTENSION, AND PULSE GENERATOR;  Surgeon: Erline Levine, MD;  Location: [facility];  Service: Neurosurgery;  Laterality: N/A;   SUBTHALAMIC STIMULATOR BATTERY REPLACEMENT Right 02/23/2022   Procedure: CHANGE IPG BATTERY, RT CHEST;  Surgeon: Dawley, Theodoro Doing, DO;  Location: [facility];  Service: Neurosurgery;  Laterality: Right;   SUBTHALAMIC STIMULATOR INSERTION Left 01/16/2019   Procedure: Left deep brain stimulator electrode revision.;  Surgeon: Erline Levine, MD;  Location: [facility];  Service: Neurosurgery;  Laterality: Left;   SUBTHALAMIC STIMULATOR INSERTION Left 01/23/2019   Procedure: Repositioning of Left Deep Brain Stimulator Electrode;  Surgeon: Erline Levine, MD;  Location: [facility];  Service: Neurosurgery;  Laterality: Left;   TONSILLECTOMY      Social History:  reports that he has never smoked. He has never used smokeless tobacco. He reports that he does not currently use alcohol. He reports that he does not use drugs.  Allergies  Allergen Reactions   Other Other (See Comments)    Surgical spray prior to adhesive causes blisters   Sulfamethoxazole Other (See Comments)    Unsure--childhood allergy    Family History  Problem Relation Age of Onset   AAA (abdominal aortic aneurysm) Mother        73 yo   Heart disease Father    Diabetes Father    Diabetes Brother    Healthy Child      Prior to Admission medications   Medication Sig Start Date End Date Taking? Authorizing Provider  amantadine (SYMMETREL) 100 MG capsule Take 1 capsule (100 mg total) by mouth 2 (two) times daily. (0700 & 1430) 03/22/22   Tat, Eustace Quail, DO  AMBULATORY NON FORMULARY MEDICATION Lift chair Dx:  G20 Patient taking differently: Lift chair Dx:  G20 01/31/20   Tat, Eustace Quail, DO  Bacillus  Coagulans-Inulin (PROBIOTIC-PREBIOTIC PO) Take 1 capsule by mouth in the morning. Doterra PB Assist+ Probiotic Defense Formula    [provider]  carbidopa-levodopa (SINEMET IR) 25-100 MG tablet 2 at 7am, 2 at 9:30am, 2 at noon, 2 at 2:30 pm, 2 at 5pm.  If you need an extra late evening, you may take 1 extra 04/16/22   Tat, Wells Guiles S, DO  clonazePAM (KLONOPIN) 0.5 MG tablet TAKE 1 AND 1/2 TABLETS BY MOUTH AT BEDTIME 03/04/22   Tat, Eustace Quail, DO  cycloSPORINE (RESTASIS) 0.05 % ophthalmic emulsion Place 1 drop into both eyes 2 (two) times daily.    [provider]  DIGESTIVE ENZYMES PO Take 1 capsule by mouth daily with lunch. Doterra TerraZyme Digestive Enzyme Comple    [provider]  escitalopram (LEXAPRO) 20 MG tablet Take 1 tablet (20 mg total) by mouth daily. Patient taking differently: Take 20 mg by mouth at bedtime. 07/13/21   Tat, Eustace Quail, DO  famotidine (PEPCID) 20 MG tablet Take 20 mg by mouth 2 (two) times daily. (0930 & 1700)    [provider]  Levodopa (  INBRIJA) 42 MG CAPS ORALLY INHALE CONTENTS OF 2 CAPSULES AS NEEDED FOR SYMPTOMS OF AN OFF PERIOD (DO NOT EXCEED 5 DOSES IN 1 DAY) Patient taking differently: Place 84 mg into inhaler and inhale 2 (two) times daily as needed (symptoms). ORALLY INHALE CONTENTS OF 2 CAPSULES AS NEEDED FOR SYMPTOMS OF AN OFF PERIOD (DO NOT EXCEED 5 DOSES IN 1 DAY) 01/15/22   Tat, Eustace Quail, DO  levothyroxine (SYNTHROID) 125 MCG tablet Take 125 mcg by mouth daily before breakfast. (0700) 02/17/18   [provider]  lisinopril (ZESTRIL) 40 MG tablet Take 40 mg by mouth in the morning. 05/27/21   [provider]  mirabegron ER (MYRBETRIQ) 50 MG TB24 tablet Take 50 mg by mouth at bedtime.    [provider]  Multiple Minerals-Vitamins (BONE ESSENTIALS) CAPS Take 1 capsule by mouth in the morning and at bedtime.  doTERRA Women Bone Nutrient Essential Complex    [provider]  Multiple  Vitamins-Minerals (CELLULAR SECURITY PO) Take 2 capsules by mouth in the morning and at bedtime. doTERRA Alpha CRS+ (In the morning & at lunch)    [provider]  Multiple Vitamins-Minerals (IMMUNE SUPPORT PO) Take 1 capsule by mouth in the morning. doTERRA On [employer]    [provider]  Omega-3 Fatty Acids (OMEGA 3 PO) Take 2 capsules by mouth in the morning and at bedtime. Doterra xEO Mega (In the morning & at lunch)    [provider]  OVER THE COUNTER MEDICATION Take 2 capsules by mouth 2 (two) times daily. Microplex VMz otc supplement    [provider]  rosuvastatin (CRESTOR) 20 MG tablet Take 20 mg by mouth daily at 12 noon.    [provider]    Physical Exam: Vitals:   05/30/22 1200 05/30/22 1400 05/30/22 1603 05/30/22 1732  BP: 113/71 132/83 135/74 (!) 161/89  Pulse: (!) 104 98 87 (!) 105  Resp: (!) 23 20 20 20   Temp: (!) 97.5 F (36.4 C)  98 F (36.7 C) 98.1 F (36.7 C)  TempSrc: Oral  Oral Oral  SpO2: 98% 96% 97% 99%   Constitutional: Resting in bed, NAD, calm, comfortable Eyes: EOMI, lids and conjunctivae normal ENMT: Mucous membranes are moist. Posterior pharynx clear of any exudate or lesions.Normal dentition.  Neck: normal, supple, no masses. Respiratory: clear to auscultation bilaterally, no wheezing, no crackles. Normal respiratory effort. No accessory muscle use.  Cardiovascular: Regular rate and rhythm, no murmurs / rubs / gallops. No extremity edema. 2+ pedal pulses. Abdomen: Mild suprapubic tenderness, no masses palpated.  Musculoskeletal: no clubbing / cyanosis. No joint deformity upper and lower extremities. Good ROM, no contractures.  Skin: Right upper cheek facial lesion recently biopsied at [facility] per patient. No induration Neurologic: Sensation intact. Strength 5/5 in all 4.  Psychiatric: Alert and oriented x 3. Normal mood.   EKG: Personally reviewed. Sinus tachycardia, rate 99, motion artifact  limits interpretation.  Assessment/Plan Principal Problem:   Abdominal pain Active Problems:   Parkinson's disease   RBD (REM behavioral disorder)   Abnormal urine findings   Essential hypertension   Generalized weakness   OSA (obstructive sleep apnea)   Hypothyroidism   Patrick Ross is a 77 y.o. male with medical history significant for Parkinson's disease, s/p neurostimulator implant right chest, HTN, hypothyroidism, depression, OSA on CPAP who is admitted with abdominal pain, nausea, generalized weakness.  Assessment and Plan: Nausea and abdominal pain/positive FOBT: Presenting with nausea, abdominal pain, heartburn/reflux symptoms.  Coughed up dark black sputum prior to arrival.  FOBT is positive.  CT shows fluid distended stomach with fluid in the esophagus.  No evidence of SBO.  Suspect symptoms related to severe heartburn/reflux, possible gastric outlet, possible GI bleed as well. -Start IV Protonix 40 mg twice daily -Hemoglobin stable, continue to monitor -May need GI eval pending clinical progress  Abnormal urinalysis: Patient reports chronic urinary incontinence.  Has had increased urinary frequency with some suprapubic discomfort.  UA suspicious for UTI. -Continue IV ceftriaxone for now -Follow urine culture  Generalized weakness: -PT/OT eval  Hypokalemia/hypocalcemia: Oral potassium supplement and IV calcium gluconate.  Repeat labs in AM.  Parkinson's disease: Follows with neurology, Dr. Carles Collet.  Continue Sinemet and amantadine.  REM behavior disorder: Continue Ativan nightly.  Hypertension: Continue lisinopril 40 mg daily.  Hypothyroidism: Continue Synthroid.  Depression: Continue Lexapro.  OSA on CPAP: Continue CPAP nightly.   DVT prophylaxis: SCDs Start: 05/30/22 1927 Code Status: Full code, confirmed with patient on admission Family Communication: Spouse and daughter at bedside Disposition Plan: From home, dispo pending clinical progress Consults  called: None Severity of Illness: The appropriate patient status for this patient is OBSERVATION. Observation status is judged to be reasonable and necessary in order to provide the required intensity of service to ensure the patient's safety. The patient's presenting symptoms, physical exam findings, and initial radiographic and laboratory data in the context of their medical condition is felt to place them at decreased risk for further clinical deterioration. Furthermore, it is anticipated that the patient will be medically stable for discharge from the hospital within 2 midnights of admission.   Zada Finders MD [facility]  If 7PM-7AM, please contact night-coverage [email or web address]  05/30/2022, 9:06 PM

## 2022-05-30 NOTE — ED Provider Notes (Signed)
[facility] Provider Note   CSN: [id number] Arrival date & time: 05/30/22  1147     History  Chief Complaint  Patient presents with   Abdominal Pain    Patrick Ross is a 77 y.o. male.  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  Describes pain to his right lower abdomen associate with anorexia, weakness and generalized nausea.  No vomiting.  Did have 1 episode of coughing this morning where he coughed up a dark-colored mucus plug.  No current blood thinner use.  No chest pain, shortness of breath, fever.  Feels generally weak with no appetite and not way to eat or drink.  Describes burning right-sided abdominal pain is worse with palpation and movement.  Last bowel movement was yesterday.  Has had issues with constipation.  Does have urinary incontinence as well and uses condom catheters at night. Still has appendix and gallbladder.  Denies any cardiac history. Feels poorly with nausea and generalized weakness.  The history is provided by the patient.  Abdominal Pain Associated symptoms: cough, fatigue and nausea   Associated symptoms: no dysuria, no fever, no hematuria, no shortness of breath and no vomiting        Home Medications Prior to Admission medications   Medication Sig Start Date End Date Taking? Authorizing Provider  amantadine (SYMMETREL) 100 MG capsule Take 1 capsule (100 mg total) by mouth 2 (two) times daily. (0700 & 1430) 03/22/22   Tat, Eustace Quail, DO  AMBULATORY NON FORMULARY MEDICATION Lift chair Dx:  G20 Patient taking differently: Lift chair Dx:  G20 01/31/20   Tat, Eustace Quail, DO  Bacillus Coagulans-Inulin (PROBIOTIC-PREBIOTIC PO) Take 1 capsule by mouth in the morning. Doterra PB Assist+ Probiotic Defense Formula    [provider]  carbidopa-levodopa (SINEMET IR) 25-100 MG tablet 2 at 7am, 2 at 9:30am, 2 at noon, 2 at 2:30 pm, 2 at 5pm.  If you need an extra late evening, you may take 1 extra 04/16/22   Tat, Wells Guiles S, DO  clonazePAM (KLONOPIN) 0.5 MG tablet TAKE 1 AND 1/2 TABLETS BY MOUTH AT BEDTIME 03/04/22   Tat, Eustace Quail, DO  cycloSPORINE (RESTASIS) 0.05 % ophthalmic emulsion Place 1 drop into both eyes 2 (two) times daily.    [provider]  DIGESTIVE ENZYMES PO Take 1 capsule by mouth daily with lunch. Doterra TerraZyme Digestive Enzyme Comple    [provider]  escitalopram (LEXAPRO) 20 MG tablet Take 1 tablet (20 mg total) by mouth daily. Patient taking differently: Take 20 mg by mouth at bedtime. 07/13/21   Tat, Eustace Quail, DO  famotidine (PEPCID) 20 MG tablet Take 20 mg by mouth 2 (two) times daily. (0930 & 1700)    [provider]  Levodopa (INBRIJA) 42 MG CAPS ORALLY INHALE CONTENTS OF 2 CAPSULES AS NEEDED FOR SYMPTOMS OF AN OFF PERIOD (DO NOT EXCEED 5 DOSES IN 1 DAY) Patient taking differently: Place 84 mg into inhaler and inhale 2 (two) times daily as needed (symptoms). ORALLY INHALE CONTENTS OF 2 CAPSULES AS NEEDED FOR SYMPTOMS OF AN OFF PERIOD (DO NOT EXCEED 5 DOSES IN 1 DAY) 01/15/22   Tat, Eustace Quail, DO  levothyroxine (SYNTHROID) 125 MCG tablet Take 125 mcg by mouth daily before breakfast. (0700) 02/17/18   [provider]  lisinopril (ZESTRIL) 40  MG tablet Take 40 mg by mouth in the morning. 05/27/21   [provider]  mirabegron ER (MYRBETRIQ) 50 MG TB24 tablet Take 50 mg by mouth at bedtime.    [provider]  Multiple Minerals-Vitamins (BONE ESSENTIALS) CAPS Take 1 capsule by mouth in the morning and at bedtime.  doTERRA Women Bone Nutrient Essential Complex    [provider]  Multiple Vitamins-Minerals (CELLULAR SECURITY PO) Take 2 capsules by mouth in the morning and at bedtime. doTERRA Alpha CRS+ (In the morning & at lunch)     [provider]  Multiple Vitamins-Minerals (IMMUNE SUPPORT PO) Take 1 capsule by mouth in the morning. doTERRA On [employer]    [provider]  Omega-3 Fatty Acids (OMEGA 3 PO) Take 2 capsules by mouth in the morning and at bedtime. Doterra xEO Mega (In the morning & at lunch)    [provider]  OVER THE COUNTER MEDICATION Take 2 capsules by mouth 2 (two) times daily. Microplex VMz otc supplement    [provider]  rosuvastatin (CRESTOR) 20 MG tablet Take 20 mg by mouth daily at 12 noon.    [provider]      Allergies    Other and Sulfamethoxazole    Review of Systems   Review of Systems  Constitutional:  Positive for activity change, appetite change and fatigue. Negative for fever.  HENT:  Negative for congestion and rhinorrhea.   Respiratory:  Positive for cough. Negative for chest tightness and shortness of breath.   Gastrointestinal:  Positive for abdominal pain and nausea. Negative for vomiting.  Genitourinary:  Negative for dysuria and hematuria.  Musculoskeletal:  Negative for arthralgias.  Skin:  Negative for rash.  Neurological:  Positive for weakness. Negative for dizziness, facial asymmetry and light-headedness.  Psychiatric/Behavioral:  Agitation: .ro. Self-injury: .ro. Nervous/anxious: .ro.     all other systems are negative except as noted in the HPI and PMH.   Physical Exam Updated Vital Signs BP 113/71 (BP Location: Left Arm)   Pulse (!) 104   Temp (!) 97.5 F (36.4 C) (Oral)   Resp (!) 23   SpO2 98%  Physical Exam Vitals and nursing note reviewed.  Constitutional:      General: He is not in acute distress.    Appearance: He is well-developed.     Comments: Chronically ill-appearing  HENT:     Head: Normocephalic and atraumatic.     Mouth/Throat:     Pharynx: No oropharyngeal exudate.  Eyes:     Conjunctiva/sclera: Conjunctivae normal.     Pupils: Pupils are equal, round, and reactive to light.   Neck:     Comments: No meningismus. Cardiovascular:     Rate and Rhythm: Normal rate and regular rhythm.     Heart sounds: Normal heart sounds. No murmur heard. Pulmonary:     Effort: Pulmonary effort is normal. No respiratory distress.     Breath sounds: Normal breath sounds.  Abdominal:     Palpations: Abdomen is soft.     Tenderness: There is abdominal tenderness. There is no guarding or rebound.     Comments: Right upper and lower quadrant tenderness, no guarding or rebound.  Genitourinary:    Comments: No fecal impaction. Soft stool just past finger.  Musculoskeletal:        General: No tenderness. Normal range of motion.     Cervical back: Normal range of motion and neck supple.  Skin:    General: Skin is  warm.  Neurological:     Mental Status: He is alert and oriented to person, place, and time.     Cranial Nerves: No cranial nerve deficit.     Motor: No abnormal muscle tone.     Coordination: Coordination normal.     Comments:  5/5 strength throughout. CN 2-12 intact.Equal grip strength.   Psychiatric:        Behavior: Behavior normal.     ED Results / Procedures / Treatments   Labs (all labs ordered are listed, but only abnormal results are displayed) Labs Reviewed  CBC WITH DIFFERENTIAL/PLATELET - Abnormal; Notable for the following components:      Result Value   Lymphs Abs 0.4 (*)    All other components within normal limits  COMPREHENSIVE METABOLIC PANEL - Abnormal; Notable for the following components:   Sodium 134 (*)    Potassium 3.3 (*)    CO2 21 (*)    Glucose, Bld 120 (*)    BUN 43 (*)    Calcium 7.7 (*)    Albumin 3.4 (*)    All other components within normal limits  URINALYSIS, ROUTINE W REFLEX MICROSCOPIC - Abnormal; Notable for the following components:   Color, Urine AMBER (*)    APPearance CLOUDY (*)    Protein, ur 30 (*)    Nitrite POSITIVE (*)    Leukocytes,Ua SMALL (*)    All other components within normal limits  OCCULT BLOOD X 1  CARD TO LAB, STOOL - Abnormal; Notable for the following components:   Fecal Occult Bld POSITIVE (*)    All other components within normal limits  URINALYSIS, MICROSCOPIC (REFLEX) - Abnormal; Notable for the following components:   Bacteria, UA MANY (*)    All other components within normal limits  URINE CULTURE  LIPASE, BLOOD  LACTIC ACID, PLASMA  LACTIC ACID, PLASMA  TROPONIN I (HIGH SENSITIVITY)  TROPONIN I (HIGH SENSITIVITY)    EKG EKG Interpretation  Date/Time:  Sunday May 30 2022 12:32:39 EDT Ventricular Rate:  99 PR Interval:    QRS Duration: 143 QT Interval:  347 QTC Calculation: 446 R Axis:   85 Text Interpretation: Interpretation limited secondary to artifact Nonspecific intraventricular conduction delay Repol abnrm suggests ischemia, inferior leads Artifact in lead(s) I II III aVR aVL aVF V1 V2 V4 V5 V6 Nonspecific T wave abnormality T wave inversions inferiorly and laterally appear to be new Confirmed by Ezequiel Essex [phone]) on 05/30/2022 12:38:36 PM  Radiology CT ABDOMEN PELVIS W CONTRAST  Result Date: 05/30/2022 CLINICAL DATA:  Abdominal pain, acute, nonlocalized. Poor appetite. Weakness. Burning sensation. Nausea. EXAM: CT ABDOMEN AND PELVIS WITH CONTRAST TECHNIQUE: Multidetector CT imaging of the abdomen and pelvis was performed using the standard protocol following bolus administration of intravenous contrast. RADIATION DOSE REDUCTION: This exam was performed according to the departmental dose-optimization program which includes automated exposure control, adjustment of the mA and/or kV according to patient size and/or use of iterative reconstruction technique. CONTRAST:  123mL OMNIPAQUE IOHEXOL 350 MG/ML SOLN COMPARISON:  04/10/2008. 01/19/2021 no calcified gallstones. No visible ductal dilatation. FINDINGS: Lower chest: Small hiatal hernia.  Fluid in the esophagus. Hepatobiliary: 2 cm well-marginated low-density in the ventral liver consistent with a cyst. No  change since November of 2022. no calcified gallstones. No sign of ductal dilatation. Pancreas: Normal Spleen: Normal Adrenals/Urinary Tract: Adrenal glands are normal. Kidneys are normal except for simple cysts for which no follow-up is recommended. No stone or hydronephrosis. No stone in the bladder. Stomach/Bowel: Fluid  distended stomach. No evidence of small-bowel obstruction. Moderate amount of fecal matter in the rectosigmoid could represent relative impaction. No other colon finding of note. Vascular/Lymphatic: Aortic atherosclerosis. No aneurysm. IVC is normal. No adenopathy. Reproductive: Mildly enlarged prostate. Other: No free fluid or air. Small left inguinal hernia containing only fat. Musculoskeletal: Previous lumbosacral fusion L1-S1. L1 screws into the T12-L1 disc space and there is advanced disc degeneration the at T12-L1 level. This could certainly be painful. IMPRESSION: 1. Small hiatal hernia. Fluid in the esophagus. Fluid distended stomach. No evidence of small-bowel obstruction. Moderate amount of fecal matter in the rectosigmoid could represent relative impaction. 2. Aortic atherosclerosis. 3. Previous lumbosacral fusion L1-S1. L1 screws into the T12-L1 disc space and there is advanced disc degeneration at T12-L1. This could certainly be painful. 4. Small left inguinal hernia containing only fat. Aortic Atherosclerosis (ICD10-I70.0). Electronically Signed   By: Nelson Chimes M.D.   On: 05/30/2022 14:44   CT Angio Chest PE W and/or Wo Contrast  Result Date: 05/30/2022 CLINICAL DATA:  Abnormal D-dimer.  Weakness.  Nausea. EXAM: CT ANGIOGRAPHY CHEST WITH CONTRAST TECHNIQUE: Multidetector CT imaging of the chest was performed using the standard protocol during bolus administration of intravenous contrast. Multiplanar CT image reconstructions and MIPs were obtained to evaluate the vascular anatomy. RADIATION DOSE REDUCTION: This exam was performed according to the departmental dose-optimization  program which includes automated exposure control, adjustment of the mA and/or kV according to patient size and/or use of iterative reconstruction technique. CONTRAST:  165mL OMNIPAQUE IOHEXOL 350 MG/ML SOLN COMPARISON:  Chest radiography same day FINDINGS: Cardiovascular: Heart size upper limits of normal. Coronary artery calcification and mild aortic atherosclerotic calcification are noted. Pulmonary arterial opacification is moderate. No pulmonary emboli are seen. Mediastinum/Nodes: Some fluid in the esophagus. No evidence of mass or adenopathy. Lungs/Pleura: No pleural effusion. No infiltrate or collapse. Calcified granulomas noted in both lungs, the largest in the right upper lobe measuring 6 mm. No follow-up recommended because of the dense calcified nature. Upper Abdomen: See results of abdominal CT. Musculoskeletal: Thoracic kyphotic curvature. No fracture or focal bone lesion. Sternum is negative. No abnormal rib finding. Review of the MIP images confirms the above findings. IMPRESSION: 1. No pulmonary emboli or other acute chest finding. 2. Coronary artery calcification. Aortic atherosclerosis. 3. Calcified granulomas in both lungs. No follow-up recommended because of the dense calcified nature. 4. See results of abdominal CT. Aortic Atherosclerosis (ICD10-I70.0). Electronically Signed   By: Nelson Chimes M.D.   On: 05/30/2022 14:37   DG Chest 2 View  Result Date: 05/30/2022 CLINICAL DATA:  Cough EXAM: CHEST - 2 VIEW COMPARISON:  None Available. FINDINGS: Neurostimulator battery pack projects over the right hemithorax. No pleural effusion. No pneumothorax. No focal airspace opacity. Normal cardiac and mediastinal contours. No radiographically apparent displaced rib fractures. IMPRESSION: No focal airspace opacity. Electronically Signed   By: Marin Roberts M.D.   On: 05/30/2022 13:00    Procedures Procedures    Medications Ordered in ED Medications - No data to display  ED Course/ Medical  Decision Making/ A&P                             Medical Decision Making Amount and/or Complexity of Data Reviewed Labs: ordered. Decision-making details documented in ED Course. Radiology: ordered and independent interpretation performed. Decision-making details documented in ED Course. ECG/medicine tests: ordered and independent interpretation performed. Decision-making details documented in ED Course.  Risk  OTC drugs. Prescription drug management. Decision regarding hospitalization.   Generalized weakness, right-sided abdominal pain since last night with poor appetite and nausea.  Vital stable, borderline O2 saturation.  Abdomen soft without peritoneal signs.  EKG has artifact due to deep brain stimulator but no apparent STEMI.  EKG without acute STEMI.  Patient hydrated.  Labs obtained.  Creatinine is at baseline.  No leukocytosis.  Lactate is normal.  Chest x-ray is negative for pneumonia, results reviewed interpreted by me.  Unclear etiology of patient's lower abdominal pain.  CT scan will be obtained to evaluate for surgical pathology.  He has felt  generally weak with poor appetite and burning up with upper abdominal pain.  CT remarkable for possible hiatal hernia.  No evidence of pneumonia or pulmonary embolism.  Results reviewed interpreted by me.  CT abdomen pelvis shows no bowel obstruction or acute surgical emergency.  Questionable fecal impaction but not appreciated clinically.  Will give enema. Hemoccult positive but no gross bleeding or evidence of significant GI bleed. Hemoglobin stable.   Patient continues to feel poorly. Wife worried about taking him home when he doesn't want to eat or drink. Suspect deconditioning and dehydration from UTI.  Would not be unreasonable to observe overnight for hydration and antibiotics.   Observation admission d/w Dr Reesa Chew.             Final Clinical Impression(s) / ED Diagnoses Final diagnoses:  Generalized weakness  Acute  cystitis without hematuria    Rx / DC Orders ED Discharge Orders     None         Kendan Cornforth, Annie Main, MD 05/30/22 1709

## 2022-05-30 NOTE — ED Notes (Signed)
Called [facility] for transport at 4:05.

## 2022-05-30 NOTE — Hospital Course (Signed)
Patrick Ross is a 77 y.o. male with medical history significant for Parkinson's disease, s/p neurostimulator implant right chest, HTN, hypothyroidism, depression, OSA on CPAP who is admitted with abdominal pain, nausea, generalized weakness. Also complained of spitting up black-colored sputum.  Not on any blood thinners at this time.  To have an history of DVT in right leg but now off of anticoagulation.  ED course and data reviewed.  Vital stable.  Labs pertinent for mild hypokalemia with potassium at 3.3.  Rest of the labs stable.  UA concerning for UTI with positive nitrites, small leukocytes and many bacteria.  Urine culture was sent.  FOBT positive Chest x-ray negative for any focal airspace disease. CTA chest negative for evidence of PE or acute chest finding.  Coronary artery calcification, aortic atherosclerosis, calcified granulomas in both lungs noted.   CT abdomen/pelvis with contrast showed small hiatal hernia, fluid in the esophagus, fluid distended stomach.  No bowel obstruction.  Moderate amount of fecal matter in the rectosigmoid noted.  Previous lumbosacral fusion L1-S1, L1 screws into the T12-L1 disc space and advanced disc degeneration at T12-L1 noted.  Small left inguinal hernia containing only fat also seen.  Patient received 2 L of normal saline and started on ceftriaxone.  4/1: Vitals with mildly elevated blood pressure at 167/93.  Potassium normalized at 3.6. Hemoglobin decreased to 12.1 from 13.9 on admission.  Although FOBT came back positive but patient did not had any obvious bleeding or melena noted.  Had a large bowel movement overnight.  No hemoptysis or hematemesis either.  Patient to follow-up with [facility] and will continue to follow-up with them.  No need to involve gastroenterology during this visit. Urine cultures are still pending.  History of recurrent UTI with pansensitive organism.  Physical therapist also evaluated him and they were recommending  outpatient therapy.  Addendum.  Patient had a 1 black color bowel movement, GI was consulted. Discharge was canceled.   4/2: Blood pressure significantly elevated at 183/102, added as needed labetalol.  Mild hypokalemia with potassium at 3.4.  Hemoglobin seems stable.  Urine culture with pansensitive E. Coli.  Patient had his EGD today which shows concern of gastritis and few nonbleeding erosions.  GI is recommending PPI twice daily if for 51-month.  Patient need to follow-up with his gastroenterologist for further recommendations.  He is also being given Keflex to complete a 1 week course of antibiotics for UTI.  Patient will continue with current medications and need to have a close follow-up with his providers for further recommendations.

## 2022-05-31 DIAGNOSIS — K2900 Acute gastritis without bleeding: Secondary | ICD-10-CM | POA: Diagnosis not present

## 2022-05-31 DIAGNOSIS — R1030 Lower abdominal pain, unspecified: Secondary | ICD-10-CM | POA: Diagnosis not present

## 2022-05-31 DIAGNOSIS — E039 Hypothyroidism, unspecified: Secondary | ICD-10-CM | POA: Diagnosis present

## 2022-05-31 DIAGNOSIS — R531 Weakness: Secondary | ICD-10-CM

## 2022-05-31 DIAGNOSIS — G4752 REM sleep behavior disorder: Secondary | ICD-10-CM | POA: Diagnosis present

## 2022-05-31 DIAGNOSIS — G20A1 Parkinson's disease without dyskinesia, without mention of fluctuations: Secondary | ICD-10-CM

## 2022-05-31 DIAGNOSIS — K5900 Constipation, unspecified: Secondary | ICD-10-CM | POA: Diagnosis present

## 2022-05-31 DIAGNOSIS — B9620 Unspecified Escherichia coli [E. coli] as the cause of diseases classified elsewhere: Secondary | ICD-10-CM | POA: Diagnosis present

## 2022-05-31 DIAGNOSIS — Z86718 Personal history of other venous thrombosis and embolism: Secondary | ICD-10-CM | POA: Diagnosis not present

## 2022-05-31 DIAGNOSIS — Z833 Family history of diabetes mellitus: Secondary | ICD-10-CM | POA: Diagnosis not present

## 2022-05-31 DIAGNOSIS — E876 Hypokalemia: Secondary | ICD-10-CM | POA: Diagnosis present

## 2022-05-31 DIAGNOSIS — K921 Melena: Secondary | ICD-10-CM | POA: Diagnosis present

## 2022-05-31 DIAGNOSIS — Z79890 Hormone replacement therapy (postmenopausal): Secondary | ICD-10-CM | POA: Diagnosis not present

## 2022-05-31 DIAGNOSIS — I700 Atherosclerosis of aorta: Secondary | ICD-10-CM | POA: Diagnosis present

## 2022-05-31 DIAGNOSIS — K922 Gastrointestinal hemorrhage, unspecified: Secondary | ICD-10-CM | POA: Diagnosis not present

## 2022-05-31 DIAGNOSIS — Z8249 Family history of ischemic heart disease and other diseases of the circulatory system: Secondary | ICD-10-CM | POA: Diagnosis not present

## 2022-05-31 DIAGNOSIS — G4733 Obstructive sleep apnea (adult) (pediatric): Secondary | ICD-10-CM | POA: Diagnosis not present

## 2022-05-31 DIAGNOSIS — I10 Essential (primary) hypertension: Secondary | ICD-10-CM | POA: Diagnosis not present

## 2022-05-31 DIAGNOSIS — K2100 Gastro-esophageal reflux disease with esophagitis, without bleeding: Secondary | ICD-10-CM | POA: Diagnosis not present

## 2022-05-31 DIAGNOSIS — Z981 Arthrodesis status: Secondary | ICD-10-CM | POA: Diagnosis not present

## 2022-05-31 DIAGNOSIS — K449 Diaphragmatic hernia without obstruction or gangrene: Secondary | ICD-10-CM | POA: Diagnosis present

## 2022-05-31 DIAGNOSIS — K2901 Acute gastritis with bleeding: Secondary | ICD-10-CM | POA: Diagnosis present

## 2022-05-31 DIAGNOSIS — Z79899 Other long term (current) drug therapy: Secondary | ICD-10-CM | POA: Diagnosis not present

## 2022-05-31 DIAGNOSIS — F32A Depression, unspecified: Secondary | ICD-10-CM | POA: Diagnosis present

## 2022-05-31 DIAGNOSIS — N3000 Acute cystitis without hematuria: Secondary | ICD-10-CM

## 2022-05-31 DIAGNOSIS — Z905 Acquired absence of kidney: Secondary | ICD-10-CM | POA: Diagnosis not present

## 2022-05-31 DIAGNOSIS — I2510 Atherosclerotic heart disease of native coronary artery without angina pectoris: Secondary | ICD-10-CM | POA: Diagnosis present

## 2022-05-31 DIAGNOSIS — R54 Age-related physical debility: Secondary | ICD-10-CM | POA: Diagnosis present

## 2022-05-31 LAB — CBC
HCT: 37.7 % — ABNORMAL LOW (ref 39.0–52.0)
HCT: 38.3 % — ABNORMAL LOW (ref 39.0–52.0)
Hemoglobin: 12.1 g/dL — ABNORMAL LOW (ref 13.0–17.0)
Hemoglobin: 12.5 g/dL — ABNORMAL LOW (ref 13.0–17.0)
MCH: 30.9 pg (ref 26.0–34.0)
MCH: 31.3 pg (ref 26.0–34.0)
MCHC: 32.1 g/dL (ref 30.0–36.0)
MCHC: 32.6 g/dL (ref 30.0–36.0)
MCV: 96.2 fL (ref 80.0–100.0)
MCV: 95.8 fL (ref 80.0–100.0)
Platelets: 156 10*3/uL (ref 150–400)
Platelets: 160 10*3/uL (ref 150–400)
RBC: 3.92 MIL/uL — ABNORMAL LOW (ref 4.22–5.81)
RBC: 4 MIL/uL — ABNORMAL LOW (ref 4.22–5.81)
RDW: 13 % (ref 11.5–15.5)
RDW: 12.9 % (ref 11.5–15.5)
WBC: 6.9 10*3/uL (ref 4.0–10.5)
WBC: 5.4 10*3/uL (ref 4.0–10.5)
nRBC: 0 % (ref 0.0–0.2)
nRBC: 0 % (ref 0.0–0.2)

## 2022-05-31 LAB — COMPREHENSIVE METABOLIC PANEL
ALT: 6 U/L (ref 0–44)
AST: 12 U/L — ABNORMAL LOW (ref 15–41)
Albumin: 3.2 g/dL — ABNORMAL LOW (ref 3.5–5.0)
Alkaline Phosphatase: 49 U/L (ref 38–126)
Anion gap: 6 (ref 5–15)
BUN: 45 mg/dL — ABNORMAL HIGH (ref 8–23)
CO2: 24 mmol/L (ref 22–32)
Calcium: 7.7 mg/dL — ABNORMAL LOW (ref 8.9–10.3)
Chloride: 113 mmol/L — ABNORMAL HIGH (ref 98–111)
Creatinine, Ser: 0.77 mg/dL (ref 0.61–1.24)
GFR, Estimated: >60 mL/min (ref >60)
Glucose, Bld: 98 mg/dL (ref 70–99)
Potassium: 3.6 mmol/L (ref 3.5–5.1)
Sodium: 143 mmol/L (ref 135–145)
Total Bilirubin: 0.7 mg/dL (ref 0.3–1.2)
Total Protein: 5.7 g/dL — ABNORMAL LOW (ref 6.5–8.1)

## 2022-05-31 MED ORDER — CLONAZEPAM 0.5 MG PO TABS
0.5000 mg | ORAL_TABLET | Freq: Every day | ORAL | Status: DC
  Administered 2022-05-31: 0.5 mg via ORAL
  Filled 2022-05-31: qty 1

## 2022-05-31 MED ORDER — CARBIDOPA-LEVODOPA 25-100 MG PO TABS: 1.0000 | ORAL_TABLET | Freq: Every day | ORAL | Status: DC | PRN

## 2022-05-31 MED ORDER — CARBIDOPA-LEVODOPA 25-100 MG PO TABS
2.0000 | ORAL_TABLET | Freq: Every day | ORAL | Status: DC
  Administered 2022-05-31 – 2022-06-01 (×5): 2 via ORAL
  Filled 2022-05-31 (×5): qty 2

## 2022-05-31 MED ORDER — CLONAZEPAM 0.125 MG PO TBDP
0.2500 mg | ORAL_TABLET | Freq: Every day | ORAL | Status: DC
  Administered 2022-05-31: 0.25 mg via ORAL
  Filled 2022-05-31: qty 2

## 2022-05-31 MED ORDER — LACTATED RINGERS IV SOLN: INTRAVENOUS | Status: AC

## 2022-05-31 MED ORDER — CEPHALEXIN 500 MG PO CAPS: 500.0000 mg | ORAL_CAPSULE | Freq: Three times a day (TID) | ORAL | 0 refills | Status: AC

## 2022-05-31 NOTE — Assessment & Plan Note (Signed)
Urine cultures pending.  History of recurrent UTIs, mostly pansensitive. -Continue with ceftriaxone -Follow-up final urine culture results

## 2022-05-31 NOTE — Consult Note (Signed)
Referring Provider: Dr. Reesa Chew Primary Care Physician:  Jefm Petty, MD Primary Gastroenterologist:  Althia Forts  Reason for Consultation:  Melena  HPI: Patrick Ross is a 77 y.o. male with multiple medical problems as stated below called for a consult due to melena that occurred once today. Denies abdominal pain or vomiting. Hgb 12.5 (13.9). Heme positive. Small hiatal hernia. Fluid in esophagus and stomach on CT. Wife in room.  Past Medical History:  Diagnosis Date   Depression    DVT (deep venous thrombosis) (HCC)    GERD (gastroesophageal reflux disease)    History of kidney stones    passed   Hypertension    Hypothyroidism    Lumbar stenosis    OSA (obstructive sleep apnea)    wears cpap   Parkinson's disease    PONV (postoperative nausea and vomiting)    REM behavioral disorder    Urinary incontinence     Past Surgical History:  Procedure Laterality Date   BACK SURGERY     x 2 - not fusion   COLONOSCOPY     DEEP BRAIN STIMULATOR PLACEMENT     left 12/27/2012, right 12/10/2011, left revision 02/02/2013, replacement bilateral 11/21/2015   MINOR PLACEMENT OF FIDUCIAL N/A 01/09/2019   Procedure: Fiducial placement;  Surgeon: Erline Levine, MD;  Location: [place];  Service: Neurosurgery;  Laterality: N/A;  Fiducial placement   PARTIAL NEPHRECTOMY Right 2005 ish   PULSE GENERATOR IMPLANT Right 01/23/2019   Procedure: Right chest implantable pulse generator change;  Surgeon: Erline Levine, MD;  Location: [facility];  Service: Neurosurgery;  Laterality: Right;   SPINAL FUSION     lumbar Fusion x 2   SUBTHALAMIC STIMULATOR BATTERY REPLACEMENT Left 02/02/2018   Procedure: Change implantable pulse generator battery, Left chest;  Surgeon: Erline Levine, MD;  Location: [place];  Service: Neurosurgery;  Laterality: Left;   SUBTHALAMIC STIMULATOR BATTERY REPLACEMENT N/A 01/22/2020   Procedure: REMOVAL OF LEFT BRAIN DEEP BRAIN STIMULATOR ELECTRODE, EXTENSION, AND PULSE GENERATOR;  Surgeon:  Erline Levine, MD;  Location: [facility];  Service: Neurosurgery;  Laterality: N/A;   SUBTHALAMIC STIMULATOR BATTERY REPLACEMENT Right 02/23/2022   Procedure: CHANGE IPG BATTERY, RT CHEST;  Surgeon: Dawley, Theodoro Doing, DO;  Location: [facility];  Service: Neurosurgery;  Laterality: Right;   SUBTHALAMIC STIMULATOR INSERTION Left 01/16/2019   Procedure: Left deep brain stimulator electrode revision.;  Surgeon: Erline Levine, MD;  Location: [facility];  Service: Neurosurgery;  Laterality: Left;   SUBTHALAMIC STIMULATOR INSERTION Left 01/23/2019   Procedure: Repositioning of Left Deep Brain Stimulator Electrode;  Surgeon: Erline Levine, MD;  Location: [place] [facility];  Service: Neurosurgery;  Laterality: Left;   TONSILLECTOMY      Prior to Admission medications   Medication Sig Start Date End Date Taking? Authorizing Provider  amantadine (SYMMETREL) 100 MG capsule Take 1 capsule (100 mg total) by mouth 2 (two) times daily. (0700 & 1430) Patient taking differently: Take 100 mg by mouth 2 (two) times daily. 03/22/22  Yes Tat, Eustace Quail, DO  Bacillus Coagulans-Inulin (PROBIOTIC-PREBIOTIC PO) Take 1 capsule by mouth in the morning. Doterra PB Assist+ Probiotic Defense Formula   Yes [provider]  carbidopa-levodopa (SINEMET IR) 25-100 MG tablet 2 at 7am, 2 at 9:30am, 2 at noon, 2 at 2:30 pm, 2 at 5pm.  If you need an extra late evening, you may take 1 extra Patient taking differently: Take 1-2 tablets by mouth See admin instructions. Take 2 tablets by mouth 5 times daily (0700,0930,1200,1430,1700). May take an extra  tablet in the late evening if needed. 04/16/22  Yes Tat, Eustace Quail, DO  cephALEXin (KEFLEX) 500 MG capsule Take 1 capsule (500 mg total) by mouth 3 (three) times daily for 5 days. 05/31/22 06/05/22 Yes Lorella Nimrod, MD  Cholecalciferol (VITAMIN D3 PO) Take 1 tablet by mouth daily.   Yes [provider]  clonazePAM (KLONOPIN) 0.5 MG tablet TAKE 1 AND 1/2 TABLETS BY MOUTH AT BEDTIME Patient taking  differently: Take 0.75 mg by mouth at bedtime. 03/04/22  Yes Tat, Eustace Quail, DO  cycloSPORINE (RESTASIS) 0.05 % ophthalmic emulsion Place 1 drop into both eyes 2 (two) times daily.   Yes [provider]  escitalopram (LEXAPRO) 20 MG tablet Take 1 tablet (20 mg total) by mouth daily. Patient taking differently: Take 20 mg by mouth at bedtime. 07/13/21  Yes Tat, Eustace Quail, DO  famotidine (PEPCID) 20 MG tablet Take 20 mg by mouth 2 (two) times daily.   Yes [provider]  FIBER PO Take 1 Dose by mouth daily as needed (Bowel health).   Yes [provider]  Levodopa (INBRIJA) 42 MG CAPS ORALLY INHALE CONTENTS OF 2 CAPSULES AS NEEDED FOR SYMPTOMS OF AN OFF PERIOD (DO NOT EXCEED 5 DOSES IN 1 DAY) Patient taking differently: Place 84 mg into inhaler and inhale 2 (two) times daily as needed (Parkinson's). ORALLY INHALE CONTENTS OF 2 CAPSULES AS NEEDED FOR SYMPTOMS OF AN OFF PERIOD (DO NOT EXCEED 5 DOSES IN 1 DAY) 01/15/22  Yes Tat, Eustace Quail, DO  levothyroxine (SYNTHROID) 125 MCG tablet Take 125 mcg by mouth daily before breakfast. 02/17/18  Yes [provider]  lisinopril (ZESTRIL) 40 MG tablet Take 40 mg by mouth in the morning. 05/27/21  Yes [provider]  mirabegron ER (MYRBETRIQ) 50 MG TB24 tablet Take 50 mg by mouth at bedtime.   Yes [provider]  Multiple Vitamins-Minerals (CELLULAR SECURITY PO) Take 1 capsule by mouth at bedtime. doTERRA Alpha CRS+ (In the morning & at lunch)   Yes [provider]  Multiple Vitamins-Minerals (IMMUNE SUPPORT PO) Take 1 capsule by mouth in the morning. doTERRA On Guard Protective Blend   Yes [provider]  Omega-3 Fatty Acids (OMEGA 3 PO) Take 2 capsules by mouth in the morning and at bedtime. Doterra xEO Mega (In the morning & at lunch) 2qam 1qhs   Yes [provider]  OVER THE COUNTER MEDICATION Take 1 tablet by mouth 2 (two) times daily. Doterra Bone & Joint   Yes [provider]  polyethylene glycol (MIRALAX / GLYCOLAX) 17 g packet Take 17 g by mouth daily.   Yes [provider]  rosuvastatin (CRESTOR) 20 MG tablet Take 20 mg by mouth daily at 12 noon.   Yes [provider]  TURMERIC PO Take 1 tablet by mouth daily.   Yes [provider]  AMBULATORY NON [place] chair Dx:  G20 Patient taking differently: Lift chair Dx:  G20 01/31/20   Tat, Eustace Quail, DO    Scheduled Meds:  amantadine  100 mg Oral BID   carbidopa-levodopa  2 tablet Oral 5 X Daily   clonazePAM  0.5 mg Oral QHS   And   clonazepam  0.25 mg Oral QHS   escitalopram  20 mg Oral QHS   levothyroxine  125 mcg Oral Q0600   lisinopril  40 mg Oral Daily   mirabegron ER  50 mg Oral QHS   pantoprazole (PROTONIX) IV  40 mg Intravenous Q12H  rosuvastatin  20 mg Oral Q1200   sodium phosphate  1 enema Rectal Once   Continuous Infusions:  cefTRIAXone (ROCEPHIN)  IV 1 g (05/31/22 1535)   lactated ringers 100 mL/hr at 05/31/22 1455   PRN Meds:.acetaminophen **OR** acetaminophen, carbidopa-levodopa, ondansetron  Allergies as of 05/30/2022 - Review Complete 05/30/2022  Allergen Reaction Noted   Other Other (See Comments) 11/24/2011   Sulfamethoxazole Other (See Comments) 10/21/2011    Family History  Problem Relation Age of Onset   AAA (abdominal aortic aneurysm) Mother        73 yo   Heart disease Father    Diabetes Father    Diabetes Brother    Healthy Child     Social History   Socioeconomic History   Marital status: Married    Spouse name: Not on file   Number of children: 3   Years of education: Not on file   Highest education level: Master's degree (e.g., MA, MS, MEng, MEd, MSW, MBA)  Occupational History   Not on file  Tobacco Use   Smoking status: Never   Smokeless tobacco: Never  Vaping Use   Vaping Use: Never used  Substance and Sexual Activity   Alcohol use: Not Currently   Drug use: Never   Sexual activity: Not on  file  Other Topics Concern   Not on file  Social History Narrative   Right handed   2 story home    Lives with spouse    Social Determinants of Health   Financial Resource Strain: Not on file  Food Insecurity: No Food Insecurity (05/30/2022)   Hunger Vital Sign    Worried About Running Out of Food in the Last Year: Never true    Ran Out of Food in the Last Year: Never true  Transportation Needs: No Transportation Needs (05/30/2022)   PRAPARE - Hydrologist (Medical): No    Lack of Transportation (Non-Medical): No  Physical Activity: Not on file  Stress: Not on file  Social Connections: Not on file  Intimate Partner Violence: Not At Risk (05/30/2022)   Humiliation, Afraid, Rape, and Kick questionnaire    Fear of Current or Ex-Partner: No    Emotionally Abused: No    Physically Abused: No    Sexually Abused: No    Review of Systems: All negative except as stated above in HPI.  Physical Exam: Vital signs: Vitals:   05/31/22 0554 05/31/22 1057  BP: (!) 167/93 (!) 161/94  Pulse: 96 98  Resp: 18 18  Temp: 98 F (36.7 C) 97.9 F (36.6 C)  SpO2: 100% 100%   Last BM Date : 05/31/22 General:   Lethargic, chronically ill-appearing, well-nourished, no acute distress Head: normocephalic, atraumatic Eyes: anicteric sclera ENT: oropharynx clear Neck: supple, nontender Lungs:  Clear throughout to auscultation.   No wheezes, crackles, or rhonchi. No acute distress. Heart:  Regular rate and rhythm; no murmurs, clicks, rubs,  or gallops. Abdomen: soft, nontender, nondistended, +BS  Rectal:  Deferred Ext: no edema  GI:  Lab Results: Recent Labs    05/30/22 1242 05/31/22 0322 05/31/22 1654  WBC 5.1 6.9 5.4  HGB 13.9 12.1* 12.5*  HCT 40.9 37.7* 38.3*  PLT 191 156 160   BMET Recent Labs    05/30/22 1242 05/31/22 0322  NA 134* 143  K 3.3* 3.6  CL 103 113*  CO2 21* 24  GLUCOSE 120* 98  BUN 43* 45*  CREATININE 1.20 0.77  CALCIUM 7.7* 7.7*  LFT Recent Labs    05/31/22 0322  PROT 5.7*  ALBUMIN 3.2*  AST 12*  ALT 6  ALKPHOS 49  BILITOT 0.7   PT/INR No results for input(s): "LABPROT", "INR" in the last 72 hours.   Studies/Results: CT ABDOMEN PELVIS W CONTRAST  Result Date: 05/30/2022 CLINICAL DATA:  Abdominal pain, acute, nonlocalized. Poor appetite. Weakness. Burning sensation. Nausea. EXAM: CT ABDOMEN AND PELVIS WITH CONTRAST TECHNIQUE: Multidetector CT imaging of the abdomen and pelvis was performed using the standard protocol following bolus administration of intravenous contrast. RADIATION DOSE REDUCTION: This exam was performed according to the departmental dose-optimization program which includes automated exposure control, adjustment of the mA and/or kV according to patient size and/or use of iterative reconstruction technique. CONTRAST:  13mL OMNIPAQUE IOHEXOL 350 MG/ML SOLN COMPARISON:  04/10/2008. 01/19/2021 no calcified gallstones. No visible ductal dilatation. FINDINGS: Lower chest: Small hiatal hernia.  Fluid in the esophagus. Hepatobiliary: 2 cm well-marginated low-density in the ventral liver consistent with a cyst. No change since November of 2022. no calcified gallstones. No sign of ductal dilatation. Pancreas: Normal Spleen: Normal Adrenals/Urinary Tract: Adrenal glands are normal. Kidneys are normal except for simple cysts for which no follow-up is recommended. No stone or hydronephrosis. No stone in the bladder. Stomach/Bowel: Fluid distended stomach. No evidence of small-bowel obstruction. Moderate amount of fecal matter in the rectosigmoid could represent relative impaction. No other colon finding of note. Vascular/Lymphatic: Aortic atherosclerosis. No aneurysm. IVC is normal. No adenopathy. Reproductive: Mildly enlarged prostate. Other: No free fluid or air. Small left inguinal hernia containing only fat. Musculoskeletal: Previous lumbosacral fusion L1-S1. L1 screws into the T12-L1 disc space and there is  advanced disc degeneration the at T12-L1 level. This could certainly be painful. IMPRESSION: 1. Small hiatal hernia. Fluid in the esophagus. Fluid distended stomach. No evidence of small-bowel obstruction. Moderate amount of fecal matter in the rectosigmoid could represent relative impaction. 2. Aortic atherosclerosis. 3. Previous lumbosacral fusion L1-S1. L1 screws into the T12-L1 disc space and there is advanced disc degeneration at T12-L1. This could certainly be painful. 4. Small left inguinal hernia containing only fat. Aortic Atherosclerosis (ICD10-I70.0). Electronically Signed   By: Nelson Chimes M.D.   On: 05/30/2022 14:44   CT Angio Chest PE W and/or Wo Contrast  Result Date: 05/30/2022 CLINICAL DATA:  Abnormal D-dimer.  Weakness.  Nausea. EXAM: CT ANGIOGRAPHY CHEST WITH CONTRAST TECHNIQUE: Multidetector CT imaging of the chest was performed using the standard protocol during bolus administration of intravenous contrast. Multiplanar CT image reconstructions and MIPs were obtained to evaluate the vascular anatomy. RADIATION DOSE REDUCTION: This exam was performed according to the departmental dose-optimization program which includes automated exposure control, adjustment of the mA and/or kV according to patient size and/or use of iterative reconstruction technique. CONTRAST:  133mL OMNIPAQUE IOHEXOL 350 MG/ML SOLN COMPARISON:  Chest radiography same day FINDINGS: Cardiovascular: Heart size upper limits of normal. Coronary artery calcification and mild aortic atherosclerotic calcification are noted. Pulmonary arterial opacification is moderate. No pulmonary emboli are seen. Mediastinum/Nodes: Some fluid in the esophagus. No evidence of mass or adenopathy. Lungs/Pleura: No pleural effusion. No infiltrate or collapse. Calcified granulomas noted in both lungs, the largest in the right upper lobe measuring 6 mm. No follow-up recommended because of the dense calcified nature. Upper Abdomen: See results of  abdominal CT. Musculoskeletal: Thoracic kyphotic curvature. No fracture or focal bone lesion. Sternum is negative. No abnormal rib finding. Review of the MIP images confirms the above findings. IMPRESSION: 1. No pulmonary  emboli or other acute chest finding. 2. Coronary artery calcification. Aortic atherosclerosis. 3. Calcified granulomas in both lungs. No follow-up recommended because of the dense calcified nature. 4. See results of abdominal CT. Aortic Atherosclerosis (ICD10-I70.0). Electronically Signed   By: Nelson Chimes M.D.   On: 05/30/2022 14:37   DG Chest 2 View  Result Date: 05/30/2022 CLINICAL DATA:  Cough EXAM: CHEST - 2 VIEW COMPARISON:  None Available. FINDINGS: Neurostimulator battery pack projects over the right hemithorax. No pleural effusion. No pneumothorax. No focal airspace opacity. Normal cardiac and mediastinal contours. No radiographically apparent displaced rib fractures. IMPRESSION: No focal airspace opacity. Electronically Signed   By: Marin Roberts M.D.   On: 05/30/2022 13:00    Impression/Plan: Melenic stool with minimal drop in Hgb. No signs of ongoing bleeding but will do EGD tomorrow to further evaluate source of melena. Clears this evening. NPO p MN.     LOS: 0 days   Lear Ng  05/31/2022, 6:43 PM  Questions please call [phone]

## 2022-05-31 NOTE — Evaluation (Signed)
Physical Therapy One Time Evaluation Patient Details Name: Patrick Ross MRN: [id number] DOB: 04/09/1945 Today's Date: 05/31/2022  History of Present Illness  77 y.o. male with medical history significant for Parkinson's disease, s/p neurostimulator implant right chest, HTN, urinary incontinence, REM behavioral disorder, depression, OSA on CPAP who presented to the ED for evaluation of generalized weakness, nausea, poor appetite.  Clinical Impression  Patient evaluated by Physical Therapy with no further acute PT needs identified. All education has been completed and the patient has no further questions.  Pt typically ambulatory with walker and has a caregiver 3x a week that walks with him outside as well as goes to the [facility] 2x week.  Pt reports dizziness with objects moving towards him for the past 6 months and one fall within the last 6 months.  Pt could benefit from OPPT for balance and Parkinson's management if agreeable. PT is signing off. Thank you for this referral.        Recommendations for follow up therapy are one component of a multi-disciplinary discharge planning process, led by the attending physician.  Recommendations may be updated based on patient status, additional functional criteria and insurance authorization.  Follow Up Recommendations       Assistance Recommended at Discharge PRN  Patient can return home with the following  Help with stairs or ramp for entrance;Assist for transportation    Equipment Recommendations None recommended by PT  Recommendations for Other Services       Functional Status Assessment Patient has had a recent decline in their functional status and demonstrates the ability to make significant improvements in function in a reasonable and predictable amount of time.     Precautions / Restrictions Precautions Precautions: Fall      Mobility  Bed Mobility               General bed mobility comments: pt in recliner     Transfers Overall transfer level: Needs assistance Equipment used: Rolling walker (2 wheels) Transfers: Sit to/from Stand Sit to Stand: Min guard, Supervision           General transfer comment: from recline and from toilet with grab bar    Ambulation/Gait Ambulation/Gait assistance: Min guard, Supervision Gait Distance (Feet): 400 Feet Assistive device: Rolling walker (2 wheels) Gait Pattern/deviations: Step-through pattern, Decreased stride length, Narrow base of support, Scissoring       General Gait Details: slow but steady with RW, appears at his baseline; reports for the past 6 months he has had some dizziness with objects coming towards him which he states is also present today however no physical assist or steadying required  Stairs            Wheelchair Mobility    Modified Rankin (Stroke Patients Only)       Balance Overall balance assessment: History of Falls (reports one fall within the past 6 months)                                           Pertinent Vitals/Pain Pain Assessment Pain Assessment: No/denies pain    Home Living Family/patient expects to be discharged to:: Private residence Living Arrangements: Spouse/significant other Available Help at Discharge: Family;Personal care attendant Type of Home: House       Alternate Level Stairs-Number of Steps: has stair chair lift Home Layout: Two level;Bed/bath upstairs Home Equipment: Conservation officer, nature (2  wheels);Other (comment);Wheelchair - manual Radiographer, therapeutic (similar to rollator, designed for individuals w/ PD))      Prior Function Prior Level of Function : Independent/Modified Independent             Mobility Comments: pt has an aide come 3x week to walk with him and goes to the [facility] 2x week, ambulatory with walker       Hand Dominance        Extremity/Trunk Assessment   Upper Extremity Assessment Upper Extremity Assessment: Overall WFL for tasks assessed     Lower Extremity Assessment Lower Extremity Assessment: Overall WFL for tasks assessed       Communication   Communication: Other (comment) (difficult to understand at times)  Cognition Arousal/Alertness: Awake/alert Behavior During Therapy: Flat affect Overall Cognitive Status: Within Functional Limits for tasks assessed                                          General Comments      Exercises     Assessment/Plan    PT Assessment All further PT needs can be met in the next venue of care  PT Problem List Decreased balance       PT Treatment Interventions      PT Goals (Current goals can be found in the Care Plan section)  Acute Rehab PT Goals PT Goal Formulation: All assessment and education complete, DC therapy    Frequency       Co-evaluation               AM-PAC PT "6 Clicks" Mobility  Outcome Measure Help needed turning from your back to your side while in a flat bed without using bedrails?: None Help needed moving from lying on your back to sitting on the side of a flat bed without using bedrails?: None Help needed moving to and from a bed to a chair (including a wheelchair)?: None Help needed standing up from a chair using your arms (e.g., wheelchair or bedside chair)?: None Help needed to walk in hospital room?: A Little Help needed climbing 3-5 steps with a railing? : A Little 6 Click Score: 22    End of Session Equipment Utilized During Treatment: Gait belt Activity Tolerance: Patient tolerated treatment well Patient left: in chair;with call bell/phone within reach;with family/visitor present Nurse Communication: Mobility status PT Visit Diagnosis: Difficulty in walking, not elsewhere classified (R26.2)    Time: [id number] PT Time Calculation (min) (ACUTE ONLY): 18 min   Charges:   PT Evaluation $PT Eval Low Complexity: 1 Low         Kati PT, DPT Physical Therapist Acute Rehabilitation Services Office:  [place] 05/31/2022, 12:45 PM

## 2022-05-31 NOTE — Assessment & Plan Note (Signed)
CT concerning for fluid in esophagus and stomach.  Initially no melena but FOBT was positive.  He was discharged for UTI but before leaving had 1 melanotic bowel movement so discharge was canceled. -GI consult -Monitor hemoglobin -IV Protonix twice daily

## 2022-05-31 NOTE — Progress Notes (Signed)
PT request to not use cpap tonight.

## 2022-05-31 NOTE — Discharge Summary (Signed)
Physician Discharge Summary   Patient: Patrick Ross MRN: [id number] DOB: 25-Jul-1945  Admit date:     05/30/2022  Discharge date: 06/01/22  Discharge Physician: Lorella Nimrod   PCP: Jefm Petty, MD   Recommendations at discharge:  Please obtain CBC and BMP in 1 week Follow-up with primary care provider within a week Follow-up with gastroenterology   Discharge Diagnoses: Principal Problem:   Abdominal pain Active Problems:   Acute cystitis without hematuria   Upper GI bleed   Parkinson's disease   RBD (REM behavioral disorder)   Generalized weakness   Essential hypertension   OSA (obstructive sleep apnea)   Hypothyroidism   Melena   Hospital Course: DEMITRIOS DITTOE is a 77 y.o. male with medical history significant for Parkinson's disease, s/p neurostimulator implant right chest, HTN, hypothyroidism, depression, OSA on CPAP who is admitted with abdominal pain, nausea, generalized weakness. Also complained of spitting up black-colored sputum.  Not on any blood thinners at this time.  To have an history of DVT in right leg but now off of anticoagulation.  ED course and data reviewed.  Vital stable.  Labs pertinent for mild hypokalemia with potassium at 3.3.  Rest of the labs stable.  UA concerning for UTI with positive nitrites, small leukocytes and many bacteria.  Urine culture was sent.  FOBT positive Chest x-ray negative for any focal airspace disease. CTA chest negative for evidence of PE or acute chest finding.  Coronary artery calcification, aortic atherosclerosis, calcified granulomas in both lungs noted.   CT abdomen/pelvis with contrast showed small hiatal hernia, fluid in the esophagus, fluid distended stomach.  No bowel obstruction.  Moderate amount of fecal matter in the rectosigmoid noted.  Previous lumbosacral fusion L1-S1, L1 screws into the T12-L1 disc space and advanced disc degeneration at T12-L1 noted.  Small left inguinal hernia containing only fat also  seen.  Patient received 2 L of normal saline and started on ceftriaxone.  4/1: Vitals with mildly elevated blood pressure at 167/93.  Potassium normalized at 3.6. Hemoglobin decreased to 12.1 from 13.9 on admission.  Although FOBT came back positive but patient did not had any obvious bleeding or melena noted.  Had a large bowel movement overnight.  No hemoptysis or hematemesis either.  Patient to follow-up with [facility] and will continue to follow-up with them.  No need to involve gastroenterology during this visit. Urine cultures are still pending.  History of recurrent UTI with pansensitive organism.  Physical therapist also evaluated him and they were recommending outpatient therapy.  Addendum.  Patient had a 1 black color bowel movement, GI was consulted. Discharge was canceled.   4/2: Blood pressure significantly elevated at 183/102, added as needed labetalol.  Mild hypokalemia with potassium at 3.4.  Hemoglobin seems stable.  Urine culture with pansensitive E. Coli.  Patient had his EGD today which shows concern of gastritis and few nonbleeding erosions.  GI is recommending PPI twice daily if for 57-month.  Patient need to follow-up with his gastroenterologist for further recommendations.  He is also being given Keflex to complete a 1 week course of antibiotics for UTI.  Patient will continue with current medications and need to have a close follow-up with his providers for further recommendations.   Consultants: None Procedures performed: None Disposition: Home Diet recommendation:  Discharge Diet Orders (From admission, onward)     Start     Ordered   05/31/22 0000  Diet - low sodium heart healthy  05/31/22 1257           Cardiac diet DISCHARGE MEDICATION: Allergies as of 06/01/2022       Reactions   Other Other (See Comments)   Surgical spray prior to adhesive causes blisters   Sulfamethoxazole Other (See Comments)   Unsure--childhood allergy         Medication List     TAKE these medications    amantadine 100 MG capsule Commonly known as: SYMMETREL Take 1 capsule (100 mg total) by mouth 2 (two) times daily. (0700 & 1430) What changed: additional instructions   AMBULATORY NON FORMULARY MEDICATION Lift chair Dx:  G20   carbidopa-levodopa 25-100 MG tablet Commonly known as: SINEMET IR 2 at 7am, 2 at 9:30am, 2 at noon, 2 at 2:30 pm, 2 at 5pm.  If you need an extra late evening, you may take 1 extra What changed:  how much to take how to take this when to take this additional instructions   CELLULAR SECURITY PO Take 1 capsule by mouth at bedtime. doTERRA Alpha CRS+ (In the morning & at lunch)   IMMUNE SUPPORT PO Take 1 capsule by mouth in the morning. doTERRA On Guard Protective Blend   cephALEXin 500 MG capsule Commonly known as: KEFLEX Take 1 capsule (500 mg total) by mouth 3 (three) times daily for 5 days.   clonazePAM 0.5 MG tablet Commonly known as: KLONOPIN TAKE 1 AND 1/2 TABLETS BY MOUTH AT BEDTIME What changed:  how much to take how to take this when to take this additional instructions   cycloSPORINE 0.05 % ophthalmic emulsion Commonly known as: RESTASIS Place 1 drop into both eyes 2 (two) times daily.   escitalopram 20 MG tablet Commonly known as: LEXAPRO Take 1 tablet (20 mg total) by mouth daily. What changed: when to take this   famotidine 20 MG tablet Commonly known as: PEPCID Take 20 mg by mouth 2 (two) times daily.   FIBER PO Take 1 Dose by mouth daily as needed (Bowel health).   Inbrija 42 MG Caps Generic drug: Levodopa ORALLY INHALE CONTENTS OF 2 CAPSULES AS NEEDED FOR SYMPTOMS OF AN OFF PERIOD (DO NOT EXCEED 5 DOSES IN 1 DAY) What changed:  how much to take how to take this when to take this reasons to take this   levothyroxine 125 MCG tablet Commonly known as: SYNTHROID Take 125 mcg by mouth daily before breakfast.   lisinopril 40 MG tablet Commonly known as: ZESTRIL Take  40 mg by mouth in the morning.   mirabegron ER 50 MG Tb24 tablet Commonly known as: MYRBETRIQ Take 50 mg by mouth at bedtime.   OMEGA 3 PO Take 2 capsules by mouth in the morning and at bedtime. Doterra xEO Mega (In the morning & at lunch) 2qam 1qhs   omeprazole 20 MG capsule Commonly known as: PRILOSEC Take 1 capsule (20 mg total) by mouth 2 (two) times daily before a meal.   OVER THE COUNTER MEDICATION Take 1 tablet by mouth 2 (two) times daily. Doterra [employer]   polyethylene glycol 17 g packet Commonly known as: MIRALAX / GLYCOLAX Take 17 g by mouth daily.   PROBIOTIC-PREBIOTIC PO Take 1 capsule by mouth in the morning. Doterra PB Assist+ Probiotic Defense Formula   rosuvastatin 20 MG tablet Commonly known as: CRESTOR Take 20 mg by mouth daily at 12 noon.   TURMERIC PO Take 1 tablet by mouth daily.   VITAMIN D3 PO Take 1 tablet by mouth daily.  Follow-up Information     Jefm Petty, MD. Schedule an appointment as soon as possible for a visit in 1 week(s).   Specialty: Family Medicine Contact information: [id number] [place] [address] [phone]         Wilford Corner, MD Follow up in 2 week(s).   Specialty: Gastroenterology Contact information: [id number] [place] [address] [phone]                Discharge Exam: Danley Danker Weights   05/31/22 0919 06/01/22 1113  Weight: 106.1 kg 106.1 kg   General.  In no acute distress. Pulmonary.  Lungs clear bilaterally, normal respiratory effort. CV.  Regular rate and rhythm, no JVD, rub or murmur. Abdomen.  Soft, nontender, nondistended, BS positive. CNS.  Alert and oriented .  No focal neurologic deficit. Extremities.  No edema, no cyanosis, pulses intact and symmetrical. Psychiatry.  Judgment and insight appears normal.   Condition at discharge: stable  The results of significant diagnostics from this hospitalization (including  imaging, microbiology, ancillary and laboratory) are listed below for reference.   Imaging Studies: CT ABDOMEN PELVIS W CONTRAST  Result Date: 05/30/2022 CLINICAL DATA:  Abdominal pain, acute, nonlocalized. Poor appetite. Weakness. Burning sensation. Nausea. EXAM: CT ABDOMEN AND PELVIS WITH CONTRAST TECHNIQUE: Multidetector CT imaging of the abdomen and pelvis was performed using the standard protocol following bolus administration of intravenous contrast. RADIATION DOSE REDUCTION: This exam was performed according to the departmental dose-optimization program which includes automated exposure control, adjustment of the mA and/or kV according to patient size and/or use of iterative reconstruction technique. CONTRAST:  153mL OMNIPAQUE IOHEXOL 350 MG/ML SOLN COMPARISON:  04/10/2008. 01/19/2021 no calcified gallstones. No visible ductal dilatation. FINDINGS: Lower chest: Small hiatal hernia.  Fluid in the esophagus. Hepatobiliary: 2 cm well-marginated low-density in the ventral liver consistent with a cyst. No change since November of 2022. no calcified gallstones. No sign of ductal dilatation. Pancreas: Normal Spleen: Normal Adrenals/Urinary Tract: Adrenal glands are normal. Kidneys are normal except for simple cysts for which no follow-up is recommended. No stone or hydronephrosis. No stone in the bladder. Stomach/Bowel: Fluid distended stomach. No evidence of small-bowel obstruction. Moderate amount of fecal matter in the rectosigmoid could represent relative impaction. No other colon finding of note. Vascular/Lymphatic: Aortic atherosclerosis. No aneurysm. IVC is normal. No adenopathy. Reproductive: Mildly enlarged prostate. Other: No free fluid or air. Small left inguinal hernia containing only fat. Musculoskeletal: Previous lumbosacral fusion L1-S1. L1 screws into the T12-L1 disc space and there is advanced disc degeneration the at T12-L1 level. This could certainly be painful. IMPRESSION: 1. Small hiatal  hernia. Fluid in the esophagus. Fluid distended stomach. No evidence of small-bowel obstruction. Moderate amount of fecal matter in the rectosigmoid could represent relative impaction. 2. Aortic atherosclerosis. 3. Previous lumbosacral fusion L1-S1. L1 screws into the T12-L1 disc space and there is advanced disc degeneration at T12-L1. This could certainly be painful. 4. Small left inguinal hernia containing only fat. Aortic Atherosclerosis (ICD10-I70.0). Electronically Signed   By: Nelson Chimes M.D.   On: 05/30/2022 14:44   CT Angio Chest PE W and/or Wo Contrast  Result Date: 05/30/2022 CLINICAL DATA:  Abnormal D-dimer.  Weakness.  Nausea. EXAM: CT ANGIOGRAPHY CHEST WITH CONTRAST TECHNIQUE: Multidetector CT imaging of the chest was performed using the standard protocol during bolus administration of intravenous contrast. Multiplanar CT image reconstructions and MIPs were obtained to evaluate the vascular anatomy. RADIATION DOSE REDUCTION: This exam was performed  according to the departmental dose-optimization program which includes automated exposure control, adjustment of the mA and/or kV according to patient size and/or use of iterative reconstruction technique. CONTRAST:  141mL OMNIPAQUE IOHEXOL 350 MG/ML SOLN COMPARISON:  Chest radiography same day FINDINGS: Cardiovascular: Heart size upper limits of normal. Coronary artery calcification and mild aortic atherosclerotic calcification are noted. Pulmonary arterial opacification is moderate. No pulmonary emboli are seen. Mediastinum/Nodes: Some fluid in the esophagus. No evidence of mass or adenopathy. Lungs/Pleura: No pleural effusion. No infiltrate or collapse. Calcified granulomas noted in both lungs, the largest in the right upper lobe measuring 6 mm. No follow-up recommended because of the dense calcified nature. Upper Abdomen: See results of abdominal CT. Musculoskeletal: Thoracic kyphotic curvature. No fracture or focal bone lesion. Sternum is negative.  No abnormal rib finding. Review of the MIP images confirms the above findings. IMPRESSION: 1. No pulmonary emboli or other acute chest finding. 2. Coronary artery calcification. Aortic atherosclerosis. 3. Calcified granulomas in both lungs. No follow-up recommended because of the dense calcified nature. 4. See results of abdominal CT. Aortic Atherosclerosis (ICD10-I70.0). Electronically Signed   By: Nelson Chimes M.D.   On: 05/30/2022 14:37   DG Chest 2 View  Result Date: 05/30/2022 CLINICAL DATA:  Cough EXAM: CHEST - 2 VIEW COMPARISON:  None Available. FINDINGS: Neurostimulator battery pack projects over the right hemithorax. No pleural effusion. No pneumothorax. No focal airspace opacity. Normal cardiac and mediastinal contours. No radiographically apparent displaced rib fractures. IMPRESSION: No focal airspace opacity. Electronically Signed   By: Marin Roberts M.D.   On: 05/30/2022 13:00    Microbiology: Results for orders placed or performed during the hospital encounter of 05/30/22  Urine Culture (for pregnant, neutropenic or urologic patients or patients with an indwelling urinary catheter)     Status: Abnormal   Collection Time: 05/30/22  3:10 PM   Specimen: Urine, Clean Catch  Result Value Ref Range Status   Specimen Description   Final    URINE, CLEAN CATCH Performed at [facility], [address] [place], [place] [address]    Special Requests   Final    NONE Performed at [facility], [address] [place], [place] [address]    Culture >=100,000 COLONIES/mL ESCHERICHIA COLI (A)  Final   Report Status 06/01/2022 FINAL  Final   Organism ID, Bacteria ESCHERICHIA COLI (A)  Final      Susceptibility   Escherichia coli - MIC*    AMPICILLIN 4 SENSITIVE Sensitive     CEFAZOLIN <=4 SENSITIVE Sensitive     CEFEPIME <=0.12 SENSITIVE Sensitive     CEFTRIAXONE <=0.25 SENSITIVE Sensitive     CIPROFLOXACIN <=0.25 SENSITIVE Sensitive     GENTAMICIN <=1 SENSITIVE  Sensitive     IMIPENEM <=0.25 SENSITIVE Sensitive     NITROFURANTOIN <=16 SENSITIVE Sensitive     TRIMETH/SULFA <=20 SENSITIVE Sensitive     AMPICILLIN/SULBACTAM <=2 SENSITIVE Sensitive     PIP/TAZO <=4 SENSITIVE Sensitive     * >=100,000 COLONIES/mL ESCHERICHIA COLI    Labs: CBC: Recent Labs  Lab 05/30/22 1242 05/31/22 0322 05/31/22 1654 06/01/22 0448  WBC 5.1 6.9 5.4 4.5  NEUTROABS 4.3  --   --   --   HGB 13.9 12.1* 12.5* 12.3*  HCT 40.9 37.7* 38.3* 38.2*  MCV 91.5 96.2 95.8 94.8  PLT 191 156 160 [id number]*   Basic Metabolic Panel: Recent Labs  Lab 05/30/22 1242 05/31/22 0322 06/01/22 0447 06/01/22 0448  NA  134* 143  --  142  K 3.3* 3.6  --  3.4*  CL 103 113*  --  109  CO2 21* 24  --  28  GLUCOSE 120* 98  --  95  BUN 43* 45*  --  20  CREATININE 1.20 0.77  --  0.73  CALCIUM 7.7* 7.7*  --  8.1*  MG  --   --  2.0  --    Liver Function Tests: Recent Labs  Lab 05/30/22 1242 05/31/22 0322  AST 16 12*  ALT 7 6  ALKPHOS 62 49  BILITOT 1.1 0.7  PROT 6.5 5.7*  ALBUMIN 3.4* 3.2*   CBG: No results for input(s): "GLUCAP" in the last 168 hours.  Discharge time spent: greater than 30 minutes.  This record has been created using Systems analyst. Errors have been sought and corrected,but may not always be located. Such creation errors do not reflect on the standard of care.   Signed: Lorella Nimrod, MD [facility] 06/01/2022

## 2022-05-31 NOTE — Assessment & Plan Note (Signed)
-  Continue home lisinopril 

## 2022-05-31 NOTE — Evaluation (Signed)
Occupational Therapy Evaluation Patient Details Name: Patrick Ross MRN: [id number] DOB: [birth year] Today's Date: 05/31/2022   History of Present Illness Patient is a 77 year old male who presented on 3/31 with poor appetite, nausea and weakness. Patient was admitted with nausea, abdominal pain,abnormal urinalysis, and positive FOBT.  [id number] disease, s/p neurostimulator implant right chest, HTN, depression, OSA, hypothyroidism, lumbar fusions,   Clinical Impression   Patient is a 77 year old male who was admitted for above. Patient was living at home with wife and caregiver who comes 3x a week to help with bathing and dressing tasks.  Patient was supervision for standing to brush teeth with increased time in room with set up of task. Patient was min guard for transfers and increased A for LB dressing/bathing tasks.  Patient would continue to benefit from skilled OT services at this time while admitted to address noted deficits in order to improve overall safety and independence in ADLs.       Recommendations for follow up therapy are one component of a multi-disciplinary discharge planning process, led by the attending physician.  Recommendations may be updated based on patient status, additional functional criteria and insurance authorization.   Assistance Recommended at Discharge Frequent or constant Supervision/Assistance  Patient can return home with the following A little help with walking and/or transfers;A little help with bathing/dressing/bathroom;Help with stairs or ramp for entrance;Assist for transportation;Direct supervision/assist for medications management;Direct supervision/assist for financial management    Functional Status Assessment  Patient has had a recent decline in their functional status and demonstrates the ability to make significant improvements in function in a reasonable and predictable amount of time.  Equipment Recommendations  None recommended by OT        Precautions / Restrictions Precautions Precautions: Fall      Mobility Bed Mobility Overal bed mobility: Needs Assistance Bed Mobility: Supine to Sit     Supine to sit: Min guard     General bed mobility comments: with increased time        Balance Overall balance assessment: Mild deficits observed, not formally tested           ADL either performed or assessed with clinical judgement   ADL Overall ADL's : Needs assistance/impaired Eating/Feeding: Modified independent;Sitting   Grooming: Oral care;Set up;Standing Grooming Details (indicate cue type and reason): wife helps with set up per patient report. Upper Body Bathing: Set up;Sitting   Lower Body Bathing: Minimal assistance;Sitting/lateral leans   Upper Body Dressing : Set up;Sitting   Lower Body Dressing: Minimal assistance Lower Body Dressing Details (indicate cue type and reason): has A at home for this per patient report Toilet Transfer: Min guard;Ambulation;Rolling walker (2 wheels) Toilet Transfer Details (indicate cue type and reason): with increased time and cues for proper positioning Toileting- Clothing Manipulation and Hygiene: Min guard;Sit to/from stand;Minimal assistance               Vision Baseline Vision/History: 1 Wears glasses              Pertinent Vitals/Pain Pain Assessment Pain Assessment: No/denies pain     Hand Dominance Right   Extremity/Trunk Assessment Upper Extremity Assessment Upper Extremity Assessment: Overall WFL for tasks assessed   Lower Extremity Assessment Lower Extremity Assessment: Defer to PT evaluation       Communication Communication Communication: Other (comment) (difficult to understand at times)   Cognition Arousal/Alertness: Awake/alert Behavior During Therapy: Flat affect Overall Cognitive Status: Within Functional Limits for tasks assessed  Home Living Family/patient expects to be discharged to:: Private  residence Living Arrangements: Spouse/significant other Available Help at Discharge: Family;Personal care attendant Type of Home: House Home Access: Ramped entrance     [place]: Two level;Bed/bath upstairs Alternate Level Stairs-Number of Steps: has stair chair lift Alternate Level Stairs-Rails: Right           Home Equipment: [place] (2 wheels);Other (comment);Wheelchair - manual          Prior Functioning/Environment Prior Level of Function : Independent/Modified Independent             Mobility Comments: pt has an aide come 3x week to walk with him and goes to the [employer] 2x week, ambulatory with walker          OT Problem List: Decreased activity tolerance;Impaired balance (sitting and/or standing)      OT Treatment/Interventions: Self-care/ADL training;Therapeutic activities;Patient/family education;Balance training    OT Goals(Current goals can be found in the care plan section) Acute Rehab OT Goals Patient Stated Goal: to get back home OT Goal Formulation: With patient Time For Goal Achievement: 06/14/22 Potential to Achieve Goals: Fair  OT Frequency: Min 2X/week       AM-PAC OT "6 Clicks" Daily Activity     Outcome Measure Help from another person eating meals?: None Help from another person taking care of personal grooming?: A Little Help from another person toileting, which includes using toliet, bedpan, or urinal?: A Little Help from another person bathing (including washing, rinsing, drying)?: A Little Help from another person to put on and taking off regular upper body clothing?: A Little Help from another person to put on and taking off regular lower body clothing?: A Lot 6 Click Score: 18   End of Session Equipment Utilized During Treatment: Rolling walker (2 wheels) Nurse Communication: Other (comment) (ok to participate in session, patien is requesting medications for 9:30 AM)  Activity Tolerance: Patient tolerated treatment  well Patient left: in chair;with call bell/phone within reach;with chair alarm set  OT Visit Diagnosis: Unsteadiness on feet (R26.81);Other abnormalities of gait and mobility (R26.89);Muscle weakness (generalized) (M62.81)                Time: [id number] OT Time Calculation (min): 26 min Charges:  OT General Charges $OT Visit: 1 Visit OT Evaluation $OT Eval Low Complexity: 1 Low OT Treatments $Self Care/Home Management : 8-22 mins  Rennie Plowman, MS Acute Rehabilitation Department Office# [phone]   Willa Rough 05/31/2022, 1:09 PM

## 2022-05-31 NOTE — Assessment & Plan Note (Signed)
Patient was having some nausea with abdominal pain, having some reflux symptoms.  FOBT positive but initially denies any melena or other obvious bleeding. UA concerning for UTI.CT shows fluid distended stomach with fluid in the esophagus. No evidence of SBO. Suspect symptoms related to severe heartburn/reflux, possible gastric outlet, possible GI bleed as well.  -Please see below under UTI and GI bleed

## 2022-05-31 NOTE — Plan of Care (Signed)

## 2022-05-31 NOTE — Progress Notes (Signed)
Progress Note   Patient: Patrick Ross [id number] DOB: 09/12/1945 DOA: 05/30/2022     0 DOS: the patient was seen and examined on 05/31/2022   Brief hospital course: ANDRETTI STONES is a 77 y.o. male with medical history significant for Parkinson's disease, s/p neurostimulator implant right chest, HTN, hypothyroidism, depression, OSA on CPAP who is admitted with abdominal pain, nausea, generalized weakness. Also complained of spitting up black-colored sputum.  Not on any blood thinners at this time.  To have an history of DVT in right leg but now off of anticoagulation.  ED course and data reviewed.  Vital stable.  Labs pertinent for mild hypokalemia with potassium at 3.3.  Rest of the labs stable.  UA concerning for UTI with positive nitrites, small leukocytes and many bacteria.  Urine culture was sent.  FOBT positive Chest x-ray negative for any focal airspace disease. CTA chest negative for evidence of PE or acute chest finding.  Coronary artery calcification, aortic atherosclerosis, calcified granulomas in both lungs noted.   CT abdomen/pelvis with contrast showed small hiatal hernia, fluid in the esophagus, fluid distended stomach.  No bowel obstruction.  Moderate amount of fecal matter in the rectosigmoid noted.  Previous lumbosacral fusion L1-S1, L1 screws into the T12-L1 disc space and advanced disc degeneration at T12-L1 noted.  Small left inguinal hernia containing only fat also seen.  Patient received 2 L of normal saline and started on ceftriaxone.  4/1: Vitals with mildly elevated blood pressure at 167/93.  Potassium normalized at 3.6. Hemoglobin decreased to 12.1 from 13.9 on admission.  Although FOBT came back positive but patient did not had any obvious bleeding or melena noted.  Had a large bowel movement overnight.  No hemoptysis or hematemesis either.  Patient to follow-up with [facility] and will continue to follow-up with them.  No need to involve gastroenterology during  this visit. Urine cultures are still pending.  History of recurrent UTI with pansensitive organism.  Physical therapist also evaluated him and they were recommending outpatient therapy.  Addendum.  Patient had a 1 black color bowel movement, GI was consulted. Discharge was canceled.   Assessment and Plan: * Abdominal pain Patient was having some nausea with abdominal pain, having some reflux symptoms.  FOBT positive but initially denies any melena or other obvious bleeding. UA concerning for UTI.CT shows fluid distended stomach with fluid in the esophagus. No evidence of SBO. Suspect symptoms related to severe heartburn/reflux, possible gastric outlet, possible GI bleed as well.  -Please see below under UTI and GI bleed   Acute cystitis without hematuria Urine cultures pending.  History of recurrent UTIs, mostly pansensitive. -Continue with ceftriaxone -Follow-up final urine culture results  Upper GI bleed CT concerning for fluid in esophagus and stomach.  Initially no melena but FOBT was positive.  He was discharged for UTI but before leaving had 1 melanotic bowel movement so discharge was canceled. -GI consult -Monitor hemoglobin -IV Protonix twice daily  Parkinson's disease Follows with neurology, Dr. Carles Collet.  Continue Sinemet and amantadine.   RBD (REM behavioral disorder) -Continue home Lexapro and Ativan at night  Generalized weakness PT is recommending outpatient therapy  Essential hypertension -Continue home lisinopril  OSA (obstructive sleep apnea) -Continue with CPAP at night  Hypothyroidism -Continue home Synthroid   Subjective: Patient was seen and examined today.  Working with physical therapist.  Denies any obvious bleeding or melena.  Later had 1 episode of black-colored stool  Physical Exam: Vitals:   05/30/22  2147 05/31/22 0554 05/31/22 0919 05/31/22 1057  BP: (!) 169/92 (!) 167/93  (!) 161/94  Pulse: 97 96  98  Resp: 18 18  18   Temp: 98 F (36.7  C) 98 F (36.7 C)  97.9 F (36.6 C)  TempSrc:    Oral  SpO2: 98% 100%  100%  Weight:   106.1 kg   Height:   6\' 2"  (1.88 m)    General.  Frail elderly man, in no acute distress. Pulmonary.  Lungs clear bilaterally, normal respiratory effort. CV.  Regular rate and rhythm, no JVD, rub or murmur. Abdomen.  Soft, nontender, nondistended, BS positive. CNS.  Alert and oriented .  No focal neurologic deficit. Extremities.  No edema, no cyanosis, pulses intact and symmetrical.   Data Reviewed: Prior data reviewed  Family Communication: Discussed with wife at bedside  Disposition: Status is: Observation The patient will require care spanning > 2 midnights and should be moved to inpatient because: Severity of illness  Planned Discharge Destination: Home  Time spent: 50 minutes  This record has been created using Systems analyst. Errors have been sought and corrected,but may not always be located. Such creation errors do not reflect on the standard of care.   Author: Lorella Nimrod, MD 05/31/2022 1:40 PM  For on call review [email or web address].

## 2022-05-31 NOTE — H&P (View-Only) (Signed)
Referring Provider: Dr. Reesa Chew Primary Care Physician:  Jefm Petty, MD Primary Gastroenterologist:  Althia Forts  Reason for Consultation:  Melena  HPI: Patrick Ross is a 77 y.o. male with multiple medical problems as stated below called for a consult due to melena that occurred once today. Denies abdominal pain or vomiting. Hgb 12.5 (13.9). Heme positive. Small hiatal hernia. Fluid in esophagus and stomach on CT. Wife in room.  Past Medical History:  Diagnosis Date   Depression    DVT (deep venous thrombosis) (HCC)    GERD (gastroesophageal reflux disease)    History of kidney stones    passed   Hypertension    Hypothyroidism    Lumbar stenosis    OSA (obstructive sleep apnea)    wears cpap   Parkinson's disease    PONV (postoperative nausea and vomiting)    REM behavioral disorder    Urinary incontinence     Past Surgical History:  Procedure Laterality Date   BACK SURGERY     x 2 - not fusion   COLONOSCOPY     DEEP BRAIN STIMULATOR PLACEMENT     left 12/27/2012, right 12/10/2011, left revision 02/02/2013, replacement bilateral 11/21/2015   MINOR PLACEMENT OF FIDUCIAL N/A 01/09/2019   Procedure: Fiducial placement;  Surgeon: Erline Levine, MD;  Location: [place];  Service: Neurosurgery;  Laterality: N/A;  Fiducial placement   PARTIAL NEPHRECTOMY Right 2005 ish   PULSE GENERATOR IMPLANT Right 01/23/2019   Procedure: Right chest implantable pulse generator change;  Surgeon: Erline Levine, MD;  Location: [place];  Service: Neurosurgery;  Laterality: Right;   SPINAL FUSION     lumbar Fusion x 2   SUBTHALAMIC STIMULATOR BATTERY REPLACEMENT Left 02/02/2018   Procedure: Change implantable pulse generator battery, Left chest;  Surgeon: Erline Levine, MD;  Location: [place];  Service: Neurosurgery;  Laterality: Left;   SUBTHALAMIC STIMULATOR BATTERY REPLACEMENT N/A 01/22/2020   Procedure: REMOVAL OF LEFT BRAIN DEEP BRAIN STIMULATOR ELECTRODE, EXTENSION, AND PULSE GENERATOR;  Surgeon:  Erline Levine, MD;  Location: [place];  Service: Neurosurgery;  Laterality: N/A;   SUBTHALAMIC STIMULATOR BATTERY REPLACEMENT Right 02/23/2022   Procedure: CHANGE IPG BATTERY, RT CHEST;  Surgeon: Dawley, Theodoro Doing, DO;  Location: [place];  Service: Neurosurgery;  Laterality: Right;   SUBTHALAMIC STIMULATOR INSERTION Left 01/16/2019   Procedure: Left deep brain stimulator electrode revision.;  Surgeon: Erline Levine, MD;  Location: [place];  Service: Neurosurgery;  Laterality: Left;   SUBTHALAMIC STIMULATOR INSERTION Left 01/23/2019   Procedure: Repositioning of Left Deep Brain Stimulator Electrode;  Surgeon: Erline Levine, MD;  Location: [place];  Service: Neurosurgery;  Laterality: Left;   TONSILLECTOMY      Prior to Admission medications   Medication Sig Start Date End Date Taking? Authorizing Provider  amantadine (SYMMETREL) 100 MG capsule Take 1 capsule (100 mg total) by mouth 2 (two) times daily. (0700 & 1430) Patient taking differently: Take 100 mg by mouth 2 (two) times daily. 03/22/22  Yes Tat, Eustace Quail, DO  Bacillus Coagulans-Inulin (PROBIOTIC-PREBIOTIC PO) Take 1 capsule by mouth in the morning. Doterra PB Assist+ Probiotic Defense Formula   Yes [provider]  carbidopa-levodopa (SINEMET IR) 25-100 MG tablet 2 at 7am, 2 at 9:30am, 2 at noon, 2 at 2:30 pm, 2 at 5pm.  If you need an extra late evening, you may take 1 extra Patient taking differently: Take 1-2 tablets by mouth See admin instructions. Take 2 tablets by mouth 5 times daily (0700,0930,1200,1430,1700). May take an extra  tablet in the late evening if needed. 04/16/22  Yes Tat, Eustace Quail, DO  cephALEXin (KEFLEX) 500 MG capsule Take 1 capsule (500 mg total) by mouth 3 (three) times daily for 5 days. 05/31/22 06/05/22 Yes Lorella Nimrod, MD  Cholecalciferol (VITAMIN D3 PO) Take 1 tablet by mouth daily.   Yes [provider]  clonazePAM (KLONOPIN) 0.5 MG tablet TAKE 1 AND 1/2 TABLETS BY MOUTH AT BEDTIME Patient taking  differently: Take 0.75 mg by mouth at bedtime. 03/04/22  Yes Tat, Eustace Quail, DO  cycloSPORINE (RESTASIS) 0.05 % ophthalmic emulsion Place 1 drop into both eyes 2 (two) times daily.   Yes [provider]  escitalopram (LEXAPRO) 20 MG tablet Take 1 tablet (20 mg total) by mouth daily. Patient taking differently: Take 20 mg by mouth at bedtime. 07/13/21  Yes Tat, Eustace Quail, DO  famotidine (PEPCID) 20 MG tablet Take 20 mg by mouth 2 (two) times daily.   Yes [provider]  FIBER PO Take 1 Dose by mouth daily as needed (Bowel health).   Yes [provider]  Levodopa (INBRIJA) 42 MG CAPS ORALLY INHALE CONTENTS OF 2 CAPSULES AS NEEDED FOR SYMPTOMS OF AN OFF PERIOD (DO NOT EXCEED 5 DOSES IN 1 DAY) Patient taking differently: Place 84 mg into inhaler and inhale 2 (two) times daily as needed (Parkinson's). ORALLY INHALE CONTENTS OF 2 CAPSULES AS NEEDED FOR SYMPTOMS OF AN OFF PERIOD (DO NOT EXCEED 5 DOSES IN 1 DAY) 01/15/22  Yes Tat, Eustace Quail, DO  levothyroxine (SYNTHROID) 125 MCG tablet Take 125 mcg by mouth daily before breakfast. 02/17/18  Yes [provider]  lisinopril (ZESTRIL) 40 MG tablet Take 40 mg by mouth in the morning. 05/27/21  Yes [provider]  mirabegron ER (MYRBETRIQ) 50 MG TB24 tablet Take 50 mg by mouth at bedtime.   Yes [provider]  Multiple Vitamins-Minerals (CELLULAR SECURITY PO) Take 1 capsule by mouth at bedtime. doTERRA Alpha CRS+ (In the morning & at lunch)   Yes [provider]  Multiple Vitamins-Minerals (IMMUNE SUPPORT PO) Take 1 capsule by mouth in the morning. doTERRA On Guard Protective Blend   Yes [provider]  Omega-3 Fatty Acids (OMEGA 3 PO) Take 2 capsules by mouth in the morning and at bedtime. Doterra xEO Mega (In the morning & at lunch) 2qam 1qhs   Yes [provider]  OVER THE COUNTER MEDICATION Take 1 tablet by mouth 2 (two) times daily. Doterra Bone & Joint   Yes [provider]  polyethylene glycol (MIRALAX / GLYCOLAX) 17 g packet Take 17 g by mouth daily.   Yes [provider]  rosuvastatin (CRESTOR) 20 MG tablet Take 20 mg by mouth daily at 12 noon.   Yes [provider]  TURMERIC PO Take 1 tablet by mouth daily.   Yes [provider]  AMBULATORY NON [place] chair Dx:  G20 Patient taking differently: Lift chair Dx:  G20 01/31/20   Tat, Eustace Quail, DO    Scheduled Meds:  amantadine  100 mg Oral BID   carbidopa-levodopa  2 tablet Oral 5 X Daily   clonazePAM  0.5 mg Oral QHS   And   clonazepam  0.25 mg Oral QHS   escitalopram  20 mg Oral QHS   levothyroxine  125 mcg Oral Q0600   lisinopril  40 mg Oral Daily   mirabegron ER  50 mg Oral QHS   pantoprazole (PROTONIX) IV  40 mg Intravenous Q12H  rosuvastatin  20 mg Oral Q1200   sodium phosphate  1 enema Rectal Once   Continuous Infusions:  cefTRIAXone (ROCEPHIN)  IV 1 g (05/31/22 1535)   lactated ringers 100 mL/hr at 05/31/22 1455   PRN Meds:.acetaminophen **OR** acetaminophen, carbidopa-levodopa, ondansetron  Allergies as of 05/30/2022 - Review Complete 05/30/2022  Allergen Reaction Noted   Other Other (See Comments) 11/24/2011   Sulfamethoxazole Other (See Comments) 10/21/2011    Family History  Problem Relation Age of Onset   AAA (abdominal aortic aneurysm) Mother        12 yo   Heart disease Father    Diabetes Father    Diabetes Brother    Healthy Child     Social History   Socioeconomic History   Marital status: Married    Spouse name: Not on file   Number of children: 3   Years of education: Not on file   Highest education level: Master's degree (e.g., MA, MS, MEng, MEd, MSW, MBA)  Occupational History   Not on file  Tobacco Use   Smoking status: Never   Smokeless tobacco: Never  Vaping Use   Vaping Use: Never used  Substance and Sexual Activity   Alcohol use: Not Currently   Drug use: Never   Sexual activity: Not on  file  Other Topics Concern   Not on file  Social History Narrative   Right handed   2 story home    Lives with spouse    Social Determinants of Health   Financial Resource Strain: Not on file  Food Insecurity: No Food Insecurity (05/30/2022)   Hunger Vital Sign    Worried About Running Out of Food in the Last Year: Never true    Ran Out of Food in the Last Year: Never true  Transportation Needs: No Transportation Needs (05/30/2022)   PRAPARE - Hydrologist (Medical): No    Lack of Transportation (Non-Medical): No  Physical Activity: Not on file  Stress: Not on file  Social Connections: Not on file  Intimate Partner Violence: Not At Risk (05/30/2022)   Humiliation, Afraid, Rape, and Kick questionnaire    Fear of Current or Ex-Partner: No    Emotionally Abused: No    Physically Abused: No    Sexually Abused: No    Review of Systems: All negative except as stated above in HPI.  Physical Exam: Vital signs: Vitals:   05/31/22 0554 05/31/22 1057  BP: (!) 167/93 (!) 161/94  Pulse: 96 98  Resp: 18 18  Temp: 98 F (36.7 C) 97.9 F (36.6 C)  SpO2: 100% 100%   Last BM Date : 05/31/22 General:   Lethargic, chronically ill-appearing, well-nourished, no acute distress Head: normocephalic, atraumatic Eyes: anicteric sclera ENT: oropharynx clear Neck: supple, nontender Lungs:  Clear throughout to auscultation.   No wheezes, crackles, or rhonchi. No acute distress. Heart:  Regular rate and rhythm; no murmurs, clicks, rubs,  or gallops. Abdomen: soft, nontender, nondistended, +BS  Rectal:  Deferred Ext: no edema  GI:  Lab Results: Recent Labs    05/30/22 1242 05/31/22 0322 05/31/22 1654  WBC 5.1 6.9 5.4  HGB 13.9 12.1* 12.5*  HCT 40.9 37.7* 38.3*  PLT 191 156 160   BMET Recent Labs    05/30/22 1242 05/31/22 0322  NA 134* 143  K 3.3* 3.6  CL 103 113*  CO2 21* 24  GLUCOSE 120* 98  BUN 43* 45*  CREATININE 1.20 0.77  CALCIUM 7.7* 7.7*  LFT Recent Labs    05/31/22 0322  PROT 5.7*  ALBUMIN 3.2*  AST 12*  ALT 6  ALKPHOS 49  BILITOT 0.7   PT/INR No results for input(s): "LABPROT", "INR" in the last 72 hours.   Studies/Results: CT ABDOMEN PELVIS W CONTRAST  Result Date: 05/30/2022 CLINICAL DATA:  Abdominal pain, acute, nonlocalized. Poor appetite. Weakness. Burning sensation. Nausea. EXAM: CT ABDOMEN AND PELVIS WITH CONTRAST TECHNIQUE: Multidetector CT imaging of the abdomen and pelvis was performed using the standard protocol following bolus administration of intravenous contrast. RADIATION DOSE REDUCTION: This exam was performed according to the departmental dose-optimization program which includes automated exposure control, adjustment of the mA and/or kV according to patient size and/or use of iterative reconstruction technique. CONTRAST:  172mL OMNIPAQUE IOHEXOL 350 MG/ML SOLN COMPARISON:  04/10/2008. 01/19/2021 no calcified gallstones. No visible ductal dilatation. FINDINGS: Lower chest: Small hiatal hernia.  Fluid in the esophagus. Hepatobiliary: 2 cm well-marginated low-density in the ventral liver consistent with a cyst. No change since November of 2022. no calcified gallstones. No sign of ductal dilatation. Pancreas: Normal Spleen: Normal Adrenals/Urinary Tract: Adrenal glands are normal. Kidneys are normal except for simple cysts for which no follow-up is recommended. No stone or hydronephrosis. No stone in the bladder. Stomach/Bowel: Fluid distended stomach. No evidence of small-bowel obstruction. Moderate amount of fecal matter in the rectosigmoid could represent relative impaction. No other colon finding of note. Vascular/Lymphatic: Aortic atherosclerosis. No aneurysm. IVC is normal. No adenopathy. Reproductive: Mildly enlarged prostate. Other: No free fluid or air. Small left inguinal hernia containing only fat. Musculoskeletal: Previous lumbosacral fusion L1-S1. L1 screws into the T12-L1 disc space and there is  advanced disc degeneration the at T12-L1 level. This could certainly be painful. IMPRESSION: 1. Small hiatal hernia. Fluid in the esophagus. Fluid distended stomach. No evidence of small-bowel obstruction. Moderate amount of fecal matter in the rectosigmoid could represent relative impaction. 2. Aortic atherosclerosis. 3. Previous lumbosacral fusion L1-S1. L1 screws into the T12-L1 disc space and there is advanced disc degeneration at T12-L1. This could certainly be painful. 4. Small left inguinal hernia containing only fat. Aortic Atherosclerosis (ICD10-I70.0). Electronically Signed   By: Nelson Chimes M.D.   On: 05/30/2022 14:44   CT Angio Chest PE W and/or Wo Contrast  Result Date: 05/30/2022 CLINICAL DATA:  Abnormal D-dimer.  Weakness.  Nausea. EXAM: CT ANGIOGRAPHY CHEST WITH CONTRAST TECHNIQUE: Multidetector CT imaging of the chest was performed using the standard protocol during bolus administration of intravenous contrast. Multiplanar CT image reconstructions and MIPs were obtained to evaluate the vascular anatomy. RADIATION DOSE REDUCTION: This exam was performed according to the departmental dose-optimization program which includes automated exposure control, adjustment of the mA and/or kV according to patient size and/or use of iterative reconstruction technique. CONTRAST:  171mL OMNIPAQUE IOHEXOL 350 MG/ML SOLN COMPARISON:  Chest radiography same day FINDINGS: Cardiovascular: Heart size upper limits of normal. Coronary artery calcification and mild aortic atherosclerotic calcification are noted. Pulmonary arterial opacification is moderate. No pulmonary emboli are seen. Mediastinum/Nodes: Some fluid in the esophagus. No evidence of mass or adenopathy. Lungs/Pleura: No pleural effusion. No infiltrate or collapse. Calcified granulomas noted in both lungs, the largest in the right upper lobe measuring 6 mm. No follow-up recommended because of the dense calcified nature. Upper Abdomen: See results of  abdominal CT. Musculoskeletal: Thoracic kyphotic curvature. No fracture or focal bone lesion. Sternum is negative. No abnormal rib finding. Review of the MIP images confirms the above findings. IMPRESSION: 1. No pulmonary  emboli or other acute chest finding. 2. Coronary artery calcification. Aortic atherosclerosis. 3. Calcified granulomas in both lungs. No follow-up recommended because of the dense calcified nature. 4. See results of abdominal CT. Aortic Atherosclerosis (ICD10-I70.0). Electronically Signed   By: Nelson Chimes M.D.   On: 05/30/2022 14:37   DG Chest 2 View  Result Date: 05/30/2022 CLINICAL DATA:  Cough EXAM: CHEST - 2 VIEW COMPARISON:  None Available. FINDINGS: Neurostimulator battery pack projects over the right hemithorax. No pleural effusion. No pneumothorax. No focal airspace opacity. Normal cardiac and mediastinal contours. No radiographically apparent displaced rib fractures. IMPRESSION: No focal airspace opacity. Electronically Signed   By: Marin Roberts M.D.   On: 05/30/2022 13:00    Impression/Plan: Melenic stool with minimal drop in Hgb. No signs of ongoing bleeding but will do EGD tomorrow to further evaluate source of melena. Clears this evening. NPO p MN.     LOS: 0 days   Lear Ng  05/31/2022, 6:43 PM  Questions please call [phone]

## 2022-05-31 NOTE — Assessment & Plan Note (Signed)
-   Continue with CPAP at night ?

## 2022-05-31 NOTE — Assessment & Plan Note (Signed)
-   Continue home Synthroid °

## 2022-05-31 NOTE — Assessment & Plan Note (Signed)
PT is recommending outpatient therapy

## 2022-05-31 NOTE — Assessment & Plan Note (Signed)
Follows with neurology, Dr. Carles Collet.  Continue Sinemet and amantadine.

## 2022-05-31 NOTE — Assessment & Plan Note (Addendum)
-  Continue home Lexapro and Ativan at night

## 2022-06-01 ENCOUNTER — Encounter (HOSPITAL_COMMUNITY)
Admission: EM | Disposition: A | Discharge status: Home / Self Care | Payer: Self-pay | Source: Home / Self Care | Attending: Internal Medicine

## 2022-06-01 ENCOUNTER — Encounter (HOSPITAL_COMMUNITY): Payer: Self-pay | Admitting: Internal Medicine

## 2022-06-01 ENCOUNTER — Inpatient Hospital Stay (HOSPITAL_COMMUNITY): Payer: Medicare Other | Admitting: Certified Registered Nurse Anesthetist

## 2022-06-01 DIAGNOSIS — K921 Melena: Secondary | ICD-10-CM | POA: Insufficient documentation

## 2022-06-01 DIAGNOSIS — K2900 Acute gastritis without bleeding: Secondary | ICD-10-CM | POA: Diagnosis not present

## 2022-06-01 DIAGNOSIS — Z9989 Dependence on other enabling machines and devices: Secondary | ICD-10-CM

## 2022-06-01 DIAGNOSIS — R1030 Lower abdominal pain, unspecified: Secondary | ICD-10-CM | POA: Diagnosis not present

## 2022-06-01 DIAGNOSIS — K2100 Gastro-esophageal reflux disease with esophagitis, without bleeding: Secondary | ICD-10-CM | POA: Diagnosis not present

## 2022-06-01 DIAGNOSIS — I10 Essential (primary) hypertension: Secondary | ICD-10-CM

## 2022-06-01 DIAGNOSIS — G4733 Obstructive sleep apnea (adult) (pediatric): Secondary | ICD-10-CM | POA: Diagnosis not present

## 2022-06-01 DIAGNOSIS — N3000 Acute cystitis without hematuria: Secondary | ICD-10-CM | POA: Diagnosis not present

## 2022-06-01 DIAGNOSIS — K922 Gastrointestinal hemorrhage, unspecified: Secondary | ICD-10-CM | POA: Diagnosis not present

## 2022-06-01 DIAGNOSIS — R531 Weakness: Secondary | ICD-10-CM | POA: Diagnosis not present

## 2022-06-01 HISTORY — PX: BIOPSY: SHX5522

## 2022-06-01 HISTORY — PX: ESOPHAGOGASTRODUODENOSCOPY (EGD) WITH PROPOFOL: SHX5813

## 2022-06-01 LAB — CBC
HCT: 38.2 % — ABNORMAL LOW (ref 39.0–52.0)
HCT: 37.3 % — ABNORMAL LOW (ref 39.0–52.0)
Hemoglobin: 12.3 g/dL — ABNORMAL LOW (ref 13.0–17.0)
Hemoglobin: 12 g/dL — ABNORMAL LOW (ref 13.0–17.0)
MCH: 30.5 pg (ref 26.0–34.0)
MCH: 31 pg (ref 26.0–34.0)
MCHC: 32.2 g/dL (ref 30.0–36.0)
MCHC: 32.2 g/dL (ref 30.0–36.0)
MCV: 94.8 fL (ref 80.0–100.0)
MCV: 96.4 fL (ref 80.0–100.0)
Platelets: 148 10*3/uL — ABNORMAL LOW (ref 150–400)
Platelets: 160 10*3/uL (ref 150–400)
RBC: 4.03 MIL/uL — ABNORMAL LOW (ref 4.22–5.81)
RBC: 3.87 MIL/uL — ABNORMAL LOW (ref 4.22–5.81)
RDW: 12.7 % (ref 11.5–15.5)
RDW: 12.5 % (ref 11.5–15.5)
WBC: 4.5 10*3/uL (ref 4.0–10.5)
WBC: 5 10*3/uL (ref 4.0–10.5)
nRBC: 0 % (ref 0.0–0.2)
nRBC: 0 % (ref 0.0–0.2)

## 2022-06-01 LAB — BASIC METABOLIC PANEL
Anion gap: 5 (ref 5–15)
BUN: 20 mg/dL (ref 8–23)
CO2: 28 mmol/L (ref 22–32)
Calcium: 8.1 mg/dL — ABNORMAL LOW (ref 8.9–10.3)
Chloride: 109 mmol/L (ref 98–111)
Creatinine, Ser: 0.73 mg/dL (ref 0.61–1.24)
GFR, Estimated: >60 mL/min (ref >60)
Glucose, Bld: 95 mg/dL (ref 70–99)
Potassium: 3.4 mmol/L — ABNORMAL LOW (ref 3.5–5.1)
Sodium: 142 mmol/L (ref 135–145)

## 2022-06-01 LAB — URINE CULTURE: Culture: >=100000 — AB

## 2022-06-01 LAB — MAGNESIUM: Magnesium: 2 mg/dL (ref 1.7–2.4)

## 2022-06-01 SURGERY — ESOPHAGOGASTRODUODENOSCOPY (EGD) WITH PROPOFOL
Anesthesia: Monitor Anesthesia Care

## 2022-06-01 MED ORDER — PROPOFOL 10 MG/ML IV BOLUS
INTRAVENOUS | Status: DC | PRN
  Administered 2022-06-01: 40 mg via INTRAVENOUS

## 2022-06-01 MED ORDER — SODIUM CHLORIDE 0.9 % IV SOLN: INTRAVENOUS | Status: DC

## 2022-06-01 MED ORDER — PHENYLEPHRINE 80 MCG/ML (10ML) SYRINGE FOR IV PUSH (FOR BLOOD PRESSURE SUPPORT)
PREFILLED_SYRINGE | INTRAVENOUS | Status: DC | PRN
  Administered 2022-06-01: 120 ug via INTRAVENOUS

## 2022-06-01 MED ORDER — PROPOFOL 500 MG/50ML IV EMUL
INTRAVENOUS | Status: DC | PRN
  Administered 2022-06-01: 150 ug/kg/min via INTRAVENOUS

## 2022-06-01 MED ORDER — PROPOFOL 1000 MG/100ML IV EMUL
INTRAVENOUS | Status: AC
  Filled 2022-06-01: qty 100

## 2022-06-01 MED ORDER — LABETALOL HCL 5 MG/ML IV SOLN: 10.0000 mg | INTRAVENOUS | Status: DC | PRN

## 2022-06-01 MED ORDER — OMEPRAZOLE 20 MG PO CPDR: 20.0000 mg | DELAYED_RELEASE_CAPSULE | Freq: Two times a day (BID) | ORAL | 1 refills | Status: AC

## 2022-06-01 MED ORDER — LIDOCAINE 2% (20 MG/ML) 5 ML SYRINGE
INTRAMUSCULAR | Status: DC | PRN
  Administered 2022-06-01: 140 mg via INTRAVENOUS

## 2022-06-01 MED ORDER — LACTATED RINGERS IV SOLN
INTRAVENOUS | Status: AC | PRN
  Administered 2022-06-01: 1000 mL via INTRAVENOUS

## 2022-06-01 SURGICAL SUPPLY — 15 items

## 2022-06-01 NOTE — Op Note (Signed)
[facility] Patient Name: Patrick Ross Procedure Date: 06/01/2022 MRN: [id number] Attending MD: Lear Ng , MD, [id number] Date of Birth: Oct 25, 1945 CSN: [id number] Age: 77 Admit Type: Inpatient Procedure:                Upper GI endoscopy Indications:              Melena Providers:                Lear Ng, MD, Vladimir Crofts, RN, Luan Moore, Technician, Heide Scales, CRNA Referring MD:             hospital team Medicines:                Propofol per Anesthesia, Monitored Anesthesia Care Complications:            No immediate complications. Estimated Blood Loss:     Estimated blood loss was minimal. Procedure:                Pre-Anesthesia Assessment:                           - Prior to the procedure, a History and Physical                            was performed, and patient medications and                            allergies were reviewed. The patient's tolerance of                            previous anesthesia was also reviewed. The risks                            and benefits of the procedure and the sedation                            options and risks were discussed with the patient.                            All questions were answered, and informed consent                            was obtained. Prior Anticoagulants: The patient has                            taken no anticoagulant or antiplatelet agents. ASA                            Grade Assessment: III - A patient with severe                            systemic disease. After reviewing the risks and  benefits, the patient was deemed in satisfactory                            condition to undergo the procedure.                           After obtaining informed consent, the endoscope was                            passed under direct vision. Throughout the                            procedure, the patient's blood pressure,  pulse, and                            oxygen saturations were monitored continuously. The                            [id number] Olympus endoscope was introduced                            through the mouth, and advanced to the second part                            of duodenum. The upper GI endoscopy was                            accomplished without difficulty. The patient                            tolerated the procedure well. Scope In: Scope Out: Findings:      LA Grade C (one or more mucosal breaks continuous between tops of 2 or       more mucosal folds, less than 75% circumference) esophagitis with no       bleeding was found at the gastroesophageal junction.      The Z-line was found 42 cm from the incisors.      The exam of the esophagus was otherwise normal.      Segmental mild inflammation characterized by congestion (edema) and       erythema was found in the gastric antrum. Biopsies were taken with a       cold forceps for histology. Estimated blood loss was minimal.      The cardia and gastric fundus were normal on retroflexion.      The examined duodenum was normal. Impression:               - LA Grade C reflux esophagitis with no bleeding.                           - Z-line, 42 cm from the incisors.                           - Acute gastritis. Biopsied.                           -  Normal examined duodenum. Moderate Sedation:      N/A - MAC procedure Recommendation:           - Advance diet as tolerated and clear liquid diet.                           - Await pathology results.                           - Observe patient's clinical course. Procedure Code(s):        --- Professional ---                           [phone], Esophagogastroduodenoscopy, flexible,                            transoral; with biopsy, single or multiple Diagnosis Code(s):        --- Professional ---                           K92.1, Melena (includes Hematochezia)                            K29.00, Acute gastritis without bleeding                           K21.00, Gastro-esophageal reflux disease with                            esophagitis, without bleeding CPT copyright 2022 American Medical Association. All rights reserved. The codes documented in this report are preliminary and upon coder review may  be revised to meet current compliance requirements. Lear Ng, MD 06/01/2022 12:33:41 PM This report has been signed electronically. Number of Addenda: 0

## 2022-06-01 NOTE — Progress Notes (Signed)
Patient discharging at this time in stable condition.   Reviewed discharge instructions with patient and wife;  all questions answered; both verbalized understanding.  Patient escorted off via wheelchair by RN with all personal belongings.  Patient left campus in private vehicle driven by wife.

## 2022-06-01 NOTE — Anesthesia Postprocedure Evaluation (Signed)
Anesthesia Post Note  Patient: BUZ DORGAN  Procedure(s) Performed: ESOPHAGOGASTRODUODENOSCOPY (EGD) WITH PROPOFOL BIOPSY     Patient location during evaluation: Endoscopy Anesthesia Type: MAC Level of consciousness: patient cooperative, oriented and sedated Pain management: pain level controlled Vital Signs Assessment: post-procedure vital signs reviewed and stable Respiratory status: spontaneous breathing, nonlabored ventilation and respiratory function stable Cardiovascular status: blood pressure returned to baseline and stable Postop Assessment: no headache and no apparent nausea or vomiting Anesthetic complications: no Comments: Stimulator turned on in recovery by wife   No notable events documented.  Last Vitals:  Vitals:   06/01/22 1240 06/01/22 1250  BP: 110/65 (!) 140/73  Pulse: 80 83  Resp: (!) 21 (!) 21  Temp:    SpO2: 100% 99%    Last Pain:  Vitals:   06/01/22 1250  TempSrc:   PainSc: 0-No pain                 Kyan Giannone,E. Aubrionna Istre

## 2022-06-01 NOTE — TOC CM/SW Note (Signed)
  Transition of Care [facility]) Screening Note   Patient Details  Name: Patrick Ross Date of Birth: August 24, 1945   Transition of Care [facility]) CM/SW Contact:    Lennart Pall, LCSW Phone Number: 06/01/2022, 1:29 PM    Transition of Care Department [facility]) has reviewed patient and no TOC needs have been identified at this time. We will continue to monitor patient advancement through interdisciplinary progression rounds. If new patient transition needs arise, please place a TOC consult.

## 2022-06-01 NOTE — Transfer of Care (Addendum)
Immediate Anesthesia Transfer of Care Note  Patient: Patrick Ross  Procedure(s) Performed: Procedure(s): ESOPHAGOGASTRODUODENOSCOPY (EGD) WITH PROPOFOL (N/A) BIOPSY  Patient Location: PACU  Anesthesia Type:MAC  Level of Consciousness: Patient sedated, comfortable.   Airway & Oxygen Therapy: Patient spontaneously breathing, ventilating well, oxygen via simple oxygen mask.  Post-op Assessment: Report given to PACU RN, vital signs reviewed and stable.   Post vital signs: Reviewed and stable.  Complications: No apparent anesthesia complications  Last Vitals:  Vitals Value Taken Time  BP 140/81 06/01/22 1226  Temp    Pulse 81 06/01/22 1228  Resp 21 06/01/22 1228  SpO2 100 % 06/01/22 1228  Vitals shown include unvalidated device data.  Last Pain:  Vitals:   06/01/22 1113  TempSrc: Tympanic  PainSc: 0-No pain        PACU RN reminded to turn patient's deep brain stimulator back on.    Complications: No notable events documented.

## 2022-06-01 NOTE — Progress Notes (Addendum)
Pt noted BP 183/102, on call Jamison Neighbor made aware via secure chat, no new order received.

## 2022-06-01 NOTE — Anesthesia Preprocedure Evaluation (Addendum)
Anesthesia Evaluation  Patient identified by MRN, date of birth, ID band Patient awake    Reviewed: Allergy & Precautions, NPO status , Patient's Chart, lab work & pertinent test results  History of Anesthesia Complications (+) PONV  Airway Mallampati: III  TM Distance: >3 FB Neck ROM: Full  Mouth opening: Limited Mouth Opening  Dental  (+) Dental Advisory Given, Chipped   Pulmonary sleep apnea and Continuous Positive Airway Pressure Ventilation    breath sounds clear to auscultation       Cardiovascular hypertension, Pt. on medications (-) angina  Rhythm:Regular Rate:Normal     Neuro/Psych    Depression    Parkinson's    GI/Hepatic Neg liver ROS,GERD  Medicated and Controlled,,  Endo/Other  Hypothyroidism    Renal/GU      Musculoskeletal   Abdominal   Peds  Hematology negative hematology ROS (+) Hb 12.3, plt 148k   Anesthesia Other Findings   Reproductive/Obstetrics                             Anesthesia Physical Anesthesia Plan  ASA: 3  Anesthesia Plan: MAC   Post-op Pain Management:    Induction:   PONV Risk Score and Plan: 2 and Treatment may vary due to age or medical condition and Ondansetron  Airway Management Planned: Natural Airway and Nasal Cannula  Additional Equipment: None  Intra-op Plan:   Post-operative Plan:   Informed Consent: I have reviewed the patients History and Physical, chart, labs and discussed the procedure including the risks, benefits and alternatives for the proposed anesthesia with the patient or authorized representative who has indicated his/her understanding and acceptance.     Dental advisory given  Plan Discussed with: CRNA and Surgeon  Anesthesia Plan Comments: (Turn off deep brain stim through procedure)       Anesthesia Quick Evaluation

## 2022-06-01 NOTE — Interval H&P Note (Signed)
History and Physical Interval Note:  06/01/2022 12:07 PM  Patrick Ross  has presented today for surgery, with the diagnosis of Melena.  The various methods of treatment have been discussed with the patient and family. After consideration of risks, benefits and other options for treatment, the patient has consented to  Procedure(s): ESOPHAGOGASTRODUODENOSCOPY (EGD) WITH PROPOFOL (N/A) as a surgical intervention.  The patient's history has been reviewed, patient examined, no change in status, stable for surgery.  I have reviewed the patient's chart and labs.  Questions were answered to the patient's satisfaction.     Lear Ng

## 2022-06-01 NOTE — Progress Notes (Signed)
Pt 's wife turned off the spinal stimulator with the remote, pre procedure.  She turned the device back on post procedure.

## 2022-06-01 NOTE — Anesthesia Procedure Notes (Signed)
Procedure Name: MAC Date/Time: 06/01/2022 12:11 PM  Performed by: Deliah Boston, CRNAPre-anesthesia Checklist: Patient identified, Emergency Drugs available, Suction available and Patient being monitored Patient Re-evaluated:Patient Re-evaluated prior to induction Oxygen Delivery Method: Simple face mask Preoxygenation: Pre-oxygenation with 100% oxygen Placement Confirmation: positive ETCO2 and breath sounds checked- equal and bilateral Dental Injury: Teeth and Oropharynx as per pre-operative assessment

## 2022-06-03 ENCOUNTER — Encounter (HOSPITAL_COMMUNITY): Payer: Self-pay | Admitting: Gastroenterology

## 2022-06-03 LAB — SURGICAL PATHOLOGY

## 2022-06-11 NOTE — Progress Notes (Unsigned)
Assessment/Plan:   1.  Parkinsons Disease             -The patient underwent right STN DBS on December 10, 2011 and a left STN DBS on December 27, 2012.  Postoperatively, his left STN DBS lead was found to be displaced and had revision of the left lead on February 02, 2013 (pulled back 1 cm without MER).   IPG on the RIGHT changed on 01/23/19 and February 23, 2022.  He now has a Boston IPG.             -Patient underwent lead revision on January 16, 2019 on the left, as the lead was still displaced/deep.  It was pulled back 7.5 mm.  Postoperative imaging indicated that it was still 5 mm too deep, so when the battery was changed to the following week, it was pulled back another 5 mm.  Postoperative imaging indicates that the leads were in good place, but the patient hit his head on the corner of a wall and developed an abrasion over the lead, which ultimately exposed to lead.  This led to a wound infection, requiring that the entire system on the left be removed on January 22, 2020.  We have opted not to replace this lead, as the patient has really clinically done pretty well, but we also worry about skin integrity on the scalp.  -Continue carbidopa/levodopa 25/100, 2 at 7am, 2 at 9:30am, 2 at noon, 2 at 2:30 pm, 2 at 5pm.  Discussed with him that he really needs to take an extra half tablet to 1 tablet as needed for off episodes or for dose failure.  -Continue amantadine, 100 mg twice per day  -Continue Inbrija as needed, up to 5 times per day, separated by 2-hour intervals.  Told him that he may need to schedule this for first morning on and perhaps 2 other times during the day.  He is currently taking this about twice per day.  -[facility], home ramp and other equipment for the home.  He also has an aide in the home from the [place].   2.  RBD             -Continue clonazepam, 0.5 mg, 1.5 tablets at night.     3.  Urinary incontinence             -Following with urology.    4.  Low back pain             -Follows with Dr. Wynn Banker as well as [facility] neurosurgery.    5.  Depression  -Continue Lexapro, 20 mg daily.  Refilled today.  6.  Sleep apnea  -Following with neurology in [facility] (sees the PA).  On CPAP.  7.  Dizziness with bradycardia during exercise  -On lisinopril  -Now following with cardiology.  Nuclear stress test and echocardiogram were both unremarkable.  Subjective:   Patrick Ross was seen today.  Patient accompanied by wife who supplements history.  Patient had IPG changed by Dr. Jake Samples February 23, 2022.  This was replaced by [facility] IPG.  Patient did well with this.  Separately, patient was in the hospital at the very end of March/beginning of April.  Patient's discharge diagnosis was hypokalemia, acute cystitis, upper GI bleed.  Current prescribed movement disorder medications: carbidopa/levodopa 25/100, 2 at 7am, 2 at 9:30am, 2 at noon, 2 at 2:30 pm, 2 at 5pm and an  extra as needed (they take a 1/2 toward bedtime) Clonazepam 0.5 mg, 1-1/2 tablets at bedtime Amantadine, 100 mg twice per day  Inbrija (using bid ) Lexapro, 20 mg daily   PREVIOUS MEDICATIONS: amantadine; klonopin; carbidopa/levodopa 25/100; carbidopa/levodopa 50/200; stalevo; Osmolex -had dizziness with higher dosages and unable to afford the $60 per month co-pay for the lower dose; entacapone (confusion)   ALLERGIES:   Allergies  Allergen Reactions   Other Other (See Comments)    Surgical spray prior to adhesive causes blisters   Sulfamethoxazole Other (See Comments)    Unsure--childhood allergy    CURRENT MEDICATIONS:  Outpatient Encounter Medications as of 02/08/2022  Medication Sig   amantadine (SYMMETREL) 100 MG capsule TAKE 1 CAPSULE TWICE A DAY   AMBULATORY NON FORMULARY MEDICATION Lift chair Dx:  G20 (Patient taking differently: Lift chair Dx:  G20)   carbidopa-levodopa (SINEMET IR) 25-100 MG tablet 2 at 7am, 2 at 9:30am, 2 at  noon, 2 at 2:30 pm, 2 at 5pm.  If you need an extra late evening, you may take 1 extra   cholecalciferol (VITAMIN D3) 25 MCG (1000 UNIT) tablet Take 1,000 Units by mouth daily.   clonazePAM (KLONOPIN) 0.5 MG tablet TAKE 1 AND 1/2 TABLETS BY MOUTH AT BEDTIME   cycloSPORINE (RESTASIS) 0.05 % ophthalmic emulsion Place 1 drop into both eyes 2 times daily.   escitalopram (LEXAPRO) 20 MG tablet Take 1 tablet (20 mg total) by mouth daily.   famotidine (PEPCID) 20 MG tablet Take 20 mg by mouth 2 (two) times daily.   Levodopa (INBRIJA) 42 MG CAPS Place into inhaler and inhale.   Levodopa (INBRIJA) 42 MG CAPS ORALLY INHALE CONTENTS OF 2 CAPSULES AS NEEDED FOR SYMPTOMS OF AN OFF PERIOD (DO NOT EXCEED 5 DOSES IN 1 DAY)   levothyroxine (SYNTHROID) 125 MCG tablet Take 125 mcg by mouth daily.   lisinopril (ZESTRIL) 40 MG tablet Take 1 tablet by mouth daily.   Mirabegron (MYRBETRIQ PO) Take by mouth.   OVER THE COUNTER MEDICATION Take 2 tablets by mouth 2 (two) times daily. Alpha CRS otc supplement   OVER THE COUNTER MEDICATION Take 2 capsules by mouth 2 (two) times daily. Microplex VMz otc supplement   OVER THE COUNTER MEDICATION Take 2 capsules by mouth 2 (two) times daily. XEOmega 3 oil complex   OVER THE COUNTER MEDICATION Take 1 tablet by mouth daily. onguard+ otc supplement   OVER THE COUNTER MEDICATION Take 3 capsules by mouth at bedtime. terrazyme supplement   OVER THE COUNTER MEDICATION Take 1 tablet by mouth 2 (two) times daily. Bone nutrient otc supplement   OVER THE COUNTER MEDICATION Take 1 capsule by mouth at bedtime. PB Assist pro/prebiotic   rosuvastatin (CRESTOR) 20 MG tablet Take 20 mg by mouth daily.   [DISCONTINUED] entacapone (COMTAN) 200 MG tablet 1 with 7am/noon/5pm dosages of levodopa (Patient not taking: Reported on 11/12/2021)   [DISCONTINUED] nitrofurantoin, macrocrystal-monohydrate, (MACROBID) 100 MG capsule Take 1 capsule (100 mg total) by mouth daily. Begin after completing Cefdinir    [DISCONTINUED] Propylene Glycol 0.6 % SOLN Place 1 drop into both eyes 2 times daily.   [DISCONTINUED] Vibegron (GEMTESA) 75 MG TABS Take 1 tablet by mouth daily. (Patient not taking: Reported on 07/13/2021)   No facility-administered encounter medications on file as of 02/08/2022.    Objective:   PHYSICAL EXAMINATION:    VITALS:   Vitals:   02/08/22 1424  BP: (!) 142/78  Pulse: 85  SpO2: 98%  Weight: 234 lb 6.4  oz (106.3 kg)  Height:  (1.88 m)    GEN:  The patient appears stated age and is in NAD. HEENT:  Normocephalic, atraumatic. Cardiovascular: Regular rate rhythm Lungs: Clear to auscultation bilaterally Neck: No bruits  Neurological examination:  Orientation: The patient is alert and oriented x3. Cranial nerves: There is good facial symmetry with facial hypomimia, lips parted. The speech is fluent and clear.  He is much less dysarthric than previous. Soft palate rises symmetrically and there is no tongue deviation. Hearing is intact to conversational tone. Sensation: Sensation is intact to light touch throughout Motor: Strength is at least antigravity x4.  Movement examination: Tone: There is mild increased tone in the RUE/RLE (stable) Abnormal movements: none.   Coordination:  There is mild decremation on the R, with any form of RAMS, including alternating supination and pronation of the forearm, hand opening and closing, finger taps, heel taps and toe taps.  This is same as previous Gait and Station: Patient pushes off of the chair to arise.  Ambulates well with walker.  Some mild festination, and some trouble in the turn, but overall does fairly well with the U step.  I have reviewed and interpreted the following labs independently   Chemistry      Component Value Date/Time   NA 140 02/15/2020 1733   K 3.7 02/15/2020 1733   CL 103 02/15/2020 1733   CO2 23 02/15/2020 1733   BUN 17 02/15/2020 1733   CREATININE 0.75 02/15/2020 1733      Component Value  Date/Time   CALCIUM 9.4 02/15/2020 1733   ALKPHOS 75 04/12/2008 0524   AST 32 04/12/2008 0524   ALT 27 04/12/2008 0524   BILITOT 1.2 04/12/2008 0524       DBS programming was performed today which is described in more detail on a separate programming procedure note.    Total time spent on today's visit was *** minutes, including both face-to-face time and nonface-to-face time.  Time included that spent on review of records (prior notes available to me/labs/imaging if pertinent), discussing treatment and goals, answering patient's questions and coordinating care.  This did not include DBS programming time, described in more detail on separate programming procedural note.    Cc:  Loyal Jacobson, MD

## 2022-06-14 ENCOUNTER — Ambulatory Visit (INDEPENDENT_AMBULATORY_CARE_PROVIDER_SITE_OTHER): Payer: Medicare Other | Admitting: Neurology

## 2022-06-14 ENCOUNTER — Encounter: Payer: Self-pay | Admitting: Neurology

## 2022-06-14 VITALS — BP 130/70 | HR 81 | Ht 73.0 in | Wt 229.4 lb

## 2022-06-14 DIAGNOSIS — G20A2 Parkinson's disease without dyskinesia, with fluctuations: Secondary | ICD-10-CM

## 2022-06-14 DIAGNOSIS — R42 Dizziness and giddiness: Secondary | ICD-10-CM | POA: Diagnosis not present

## 2022-06-14 NOTE — Patient Instructions (Addendum)
Decrease amantadine 100 mg daily x 1 month and then STOP amantadine  Increase your water intake!  Local and Online Resources for Power over Parkinson's Group  April 2024   LOCAL Vincent PARKINSON'S GROUPS   Power over Parkinson's Group:    Power Over Parkinson's Patient Education Group will be Wednesday, April 10th-*Hybrid meting*- in person at [facility] location and via [facility], 2:00-3:00 pm.   Power over [employer] and Care Partner Groups will meet together, with plans for separate break out session for caregivers, depending on topic/speaker Upcoming Power over Parkinson's Meetings/Care Partner Support:  2nd Wednesdays of the month at 2 pm:   April 10th, May 8th Contact Amy Marriott at [email or web address] if interested in participating in this group    LOCAL EVENTS AND NEW OFFERINGS  NEW:  Parkinson's Social Game Night.  First Thursday of each month, 2:00-4:00 pm.  *Next date is April 4th*.  Rossie Muskrat [employer], [employer].  Contact [email or web address] if interested. Parkinson's CarePartner Group for Men is in the works, if interested email [email or web address] ACT FITNESS Chair Yoga classes "Train and Gain", Fridays 10 am, [employer].  Contact Gina at [phone].  Health visitor Classes offering at [employer]!  TUESDAYS (Chair Yoga)  and Wednesdays (PWR! Moves)  1:00 pm.   Contact Synetta Shadow at  [email or web address]  or [phone]  Drumming for Parkinson's will be held on 2nd and 4th Mondays at 11:00 am.   Located at the [place] of the [place] ([address]. [place]  Contact Albertina Parr at [email or web address] or [phone]  Dance for Parkinson 's classes will be on Tuesdays 10-11 am. Located in the [employer], in the first floor of the [employer] ([address] To register:  [email or web address] or  [phone] [facility] Parkinson's Tai Chi Class, Mondays at 11 am.  Call [phone] for details Hamil-Kerr Challenge.  Bike, Run, [employer] for [employer].  Saturday, April 6th at [facility].  To register, visit [email or web address] Moving Day [facility].  Saturday, May 4th, 10 am start.  Register at [email or web address]    ONLINE EDUCATION AND SUPPORT  Parkinson Foundation:  www.parkinson.org  PD Health at Home continues:  Mindfulness Mondays, Wellness Wednesdays, Fitness Fridays  (PWR! Moves as part of Fitness Fridays March 22nd, 1-1:45 pm) Upcoming Education:   Parkinson's 101.  Wednesday, April 3rd, 1-2 pm Movement for Parkinson's.  Wednesday, May 1st, 1-2 pm Expert Briefing:  Research Update:  Working to [employer] PD.  Wednesday, April 10th, 1-2 pm Trouble with Zzz's:  Sleep Challenges with Parkinson's.  Wed, May 8th 1-2 pm Register for virtual education and expert briefings (webinars) at [email or web address] Please check out their website to sign up for emails and see their full online offerings     [employer] Foundation:  [email or web address]   Third Thursday Webinars:  On the third Thursday of every month at 12 p.m. ET, join our free live webinars to learn about various aspects of living with Parkinson's disease and our work to speed medical breakthroughs.  Upcoming Webinar:  Let's Talk Taboos:  Hard-to-Discuss Parkinson's Symptoms.  Thursday, April 18th at 12 noon. Check out additional information on their website to see their full online offerings    [facility]:  [email or web address]  Upcoming Webinar:   Emergent Therapies.  Wednesday, April 2nd, 4 pm Series:  Living with Parkinson's Meetup.   Third Thursdays each month, 3 pm  Care Partner Monthly  Meetup.  With Jillene Bucks [place].  First Tuesday of each month, 2 pm  Check out additional information to Live Well Today on their  website    Parkinson and Movement Disorders (PMD) Alliance:  www.[email or web address]  [employer] Online:  Online Education Events  Sign up for emails, which are sent weekly to give you updates on programming and online offerings    Parkinson's Association of the [place]:  www.[email or web address]  Information on online support groups, education events, and online exercises including Yoga, Parkinson's exercises and more-LOTS of information on links to PD resources and online events  Virtual Support Group through Parkinson's Association of the [place]; next one is scheduled for Wednesday, April 3rd  MOVEMENT AND EXERCISE OPPORTUNITIES  PWR! Moves Classes at [facility] Exercise Room.  Wednesdays 10 and 11 am.   Contact Amy Marriott, PT [email or web address] if interested.  Parkinson's Exercise Class offerings at [employer]. *TUESDAYS* (Chair yoga) and Wednesdays (PWR! Moves)  1:00 pm.    Contact Synetta Shadow at [email or web address]    Parkinson's Wellness Recovery (PWR! Moves)  www.[email or web address]  Info on the PWR! Virtual Experience:  You will have access to our expertise?through self-assessment, guided plans that start with the PD-specific fundamentals, educational content, tips, Q&A with an expert, and a growing Engineering geologist of PD-specific pre-recorded and live exercise classes of varying types and intensity - both physical and cognitive! If that is not enough, we offer 1:1 wellness consultations (in-person or virtual) to personalize your PWR! Dance movement psychotherapist.   Parkinson [employer] Fridays:   As part of the PD Health @ Home program, this free video series focuses each week on one aspect of fitness designed to support people living with Parkinson's.? These weekly videos highlight the Parkinson Foundation fitness guidelines for people with Parkinson's disease.  [email or web address]  Dance for PD website is offering free,  live-stream classes throughout the week, as well as links to [employer] of classes:  [email or web address]  Virtual dance and Pilates for Parkinson's classes: Click on the Community Tab> Parkinson's Movement Initiative Tab.  To register for classes and for more information, visit [email or web address] and click the "community" tab.   [employer] Parkinson's Cycling Classes   [facility] [employer]:  Thursdays @ Noon-Live classes at [employer] ([employer] at [email or web address]?or [phone])  [facility] [employer]: Classes Tuesday, Wednesday and Thursday (contact [place] at [email or web address] ?or [phone])  [facility] [employer]  Varied levels of classes are offered Tuesdays and Thursdays at [employer].   Stretching with Byrd Hesselbach weekly class is also offered for people with Parkinson's  To observe a class or for more information, call [phone] or email Patricia Nettle at [email or web address]   ADDITIONAL SUPPORT AND RESOURCES  [employer]:  Online Caregiver Education Opportunities:  www.well-springsolutions.org/caregiver-education/caregiver-support-group.  You may also contact Loleta Chance at [facility] -spring.org or [phone].     [employer] April [employer] Decisions Day:  The Most Critical Legal and Medical Decisions to Consider Now!  Tuesday, April 16th, 1-3 pm at [facility] at [facility].  Contact Loleta Chance at [facility] -spring.org or [phone] Powerful Tools for Caregivers.  6 week educational series for caregivers.  April 18-May 23, 10:30 am-12:15 pm at [facility] 3rd Floor Conference Room.   Contact Loleta Chance at [facility] -spring.org or [phone] to register Well-Spring Navigator:  Just1Navigator program, a?free service to help individuals and families through the journey of determining care for older adults.  The "Navigator" is a  Child psychotherapist, Sidney Ace, who will speak with a prospective client and/or  loved ones to provide an assessment of the situation and a set of recommendations for a personalized care plan -- all free of charge, and whether?Well[employer]Spring [employer] offers the needed service or not. If the need is not a service we provide, we are well-connected with reputable programs in town that we can refer you to.  www.well-[email or web address] or to speak with the Navigator, call [phone].

## 2022-06-14 NOTE — Procedures (Signed)
DBS Programming was performed.    Manufacturer of DBS device: Medtronic but with [employer]  Total time spent programming was 10  minutes.  Device was confirmed to be on.  Soft start was confirmed to be on.  Impedences were checked and were within normal limits.  Battery was checked and was determined to be functioning normally and not near the end of life.  Final settings were as follows with group A active:   Active Contact Amplitude (V) PW (ms) Frequency (hz) Side Effects Battery  Left Brain        Group A        12/20/19 3-C+ 3.1 70 140  2.89  01/29/20 N/A       03/03/20 N/A       05/19/20 n/a       09/16/20 N/a                               Group B        12/20/19 2-C+ 2.0 70 130            Right Brain        Group A        12/20/19 3-C+ 3.2 90 135  2.94  01/29/20 3-C+ 3.2 (2.4-3.6) 90 135  2.94  03/03/20 3-C+ 3.2 90 135  2.94  05/19/20 3-C+ 3.2 (2.4-3.6) 90 135  2.92  09/16/20 3-C+ 3.2 90 135  2.90  01/12/21 3-C+ 3.25 90 135  2.88  07/13/21 3-C+ 3.25 90 135  2.82  11/12/21 3-C+ 3.25 90 135  2.73  02/08/22 3-C+ 3.25 90 135  2.56  06/14/22 4-C+ 3.8 90 136

## 2022-07-22 ENCOUNTER — Telehealth: Payer: Self-pay | Admitting: Neurology

## 2022-07-22 DIAGNOSIS — G20A2 Parkinson's disease without dyskinesia, with fluctuations: Secondary | ICD-10-CM

## 2022-07-22 MED ORDER — AMANTADINE HCL 100 MG PO CAPS
100.0000 mg | ORAL_CAPSULE | Freq: Two times a day (BID) | ORAL | 0 refills | Status: DC
Start: 2022-07-22 — End: 2022-07-30

## 2022-07-22 NOTE — Telephone Encounter (Signed)
Patient never really got completely off of the Amantadine he was down to half dose. He also never stopped altogether getting dizzy. They do want to try again with going back on the normal dose od Amantadine I will make changes in chart

## 2022-07-22 NOTE — Telephone Encounter (Signed)
Pt's wife called in stating the pt has been having some memory issues and will say that he cannot move, but his legs looks strong. She is wondering if this could be caused by not being on the amantadine? Being off of it doesn't seem to be doing well.

## 2022-07-27 ENCOUNTER — Other Ambulatory Visit: Payer: Self-pay

## 2022-07-27 ENCOUNTER — Telehealth: Payer: Self-pay | Admitting: Neurology

## 2022-07-27 DIAGNOSIS — G20A1 Parkinson's disease without dyskinesia, without mention of fluctuations: Secondary | ICD-10-CM

## 2022-07-27 MED ORDER — CARBIDOPA-LEVODOPA 25-100 MG PO TABS
1.0000 | ORAL_TABLET | ORAL | 0 refills | Status: DC
Start: 2022-07-27 — End: 2022-09-06

## 2022-07-27 MED ORDER — INBRIJA 42 MG IN CAPS
84.0000 mg | ORAL_CAPSULE | Freq: Two times a day (BID) | RESPIRATORY_TRACT | 1 refills | Status: DC | PRN
Start: 2022-07-27 — End: 2022-12-15

## 2022-07-27 NOTE — Telephone Encounter (Signed)
Both medications have been sent In and patient has been called

## 2022-07-27 NOTE — Telephone Encounter (Signed)
1. Which medications need refilled? (List name and dosage, if known) inbrija  2. Which pharmacy/location is medication to be sent to? (include street and city if Insurance claims handler) [facility]  1. Which medications need refilled? (List name and dosage, if known) carbidopa- levodopa  2. Which pharmacy/location is medication to be sent to? (include street and city [facility]

## 2022-07-28 ENCOUNTER — Telehealth: Payer: Self-pay | Admitting: Neurology

## 2022-07-28 DIAGNOSIS — G20A1 Parkinson's disease without dyskinesia, without mention of fluctuations: Secondary | ICD-10-CM

## 2022-07-28 DIAGNOSIS — F330 Major depressive disorder, recurrent, mild: Secondary | ICD-10-CM

## 2022-07-28 NOTE — Telephone Encounter (Signed)
1. Which medications need refilled? (List name and dosage, if known) escitalopram  2. Which pharmacy/location is medication to be sent to? (include street and city if [facility]) [facility]

## 2022-07-29 MED ORDER — ESCITALOPRAM OXALATE 20 MG PO TABS
20.0000 mg | ORAL_TABLET | Freq: Every day | ORAL | 2 refills | Status: DC
Start: 2022-07-29 — End: 2023-06-21

## 2022-07-29 NOTE — Telephone Encounter (Signed)
Rx resent to pharmacy they didn't get the one sent in on 07/14/22

## 2022-07-30 ENCOUNTER — Telehealth: Payer: Self-pay | Admitting: Neurology

## 2022-07-30 DIAGNOSIS — G20A2 Parkinson's disease without dyskinesia, with fluctuations: Secondary | ICD-10-CM

## 2022-07-30 MED ORDER — AMANTADINE HCL 100 MG PO CAPS
100.0000 mg | ORAL_CAPSULE | Freq: Two times a day (BID) | ORAL | 1 refills | Status: DC
Start: 2022-07-30 — End: 2022-12-20

## 2022-07-30 NOTE — Telephone Encounter (Signed)
Spoke with pt wife  she was informed that Dr Tat stated that she would think that restarting would cause more memory change but they can try if they want as long as he's not dizzy.  [facility]. She will call in 2 weeks with an update

## 2022-07-30 NOTE — Telephone Encounter (Signed)
Pt's wife called in and left a message. She is having concerns about him going off the memantadine. He has started swaying back and forth, cognitive changes, and stopping in the middle of the movement and cannot move anymore.

## 2022-07-30 NOTE — Telephone Encounter (Signed)
Concerns are about being off the amantadine He has started swaying back and forth, cognitive changes, and stopping in the middle of the movement and cannot move anymore.

## 2022-08-02 ENCOUNTER — Telehealth: Payer: Self-pay | Admitting: Neurology

## 2022-08-02 NOTE — Telephone Encounter (Signed)
[facility] and patiens wife and carbidopa levodopa is on the way should be here in 2-3 days if not I told patients wife to call me back

## 2022-08-02 NOTE — Telephone Encounter (Signed)
Pt's wife called in and left a message with the access nurse 07/30/22 at 5:18 PM. The pt needs an emergency refill of his carbidopa-levodopa. He is completely out. Emergency refill was called into [facility].  Full access nurse report is in Dr. Don Perking box

## 2022-09-06 ENCOUNTER — Other Ambulatory Visit: Payer: Self-pay

## 2022-09-06 ENCOUNTER — Telehealth: Payer: Self-pay | Admitting: *Deleted

## 2022-09-06 DIAGNOSIS — G20A1 Parkinson's disease without dyskinesia, without mention of fluctuations: Secondary | ICD-10-CM

## 2022-09-06 DIAGNOSIS — F330 Major depressive disorder, recurrent, mild: Secondary | ICD-10-CM

## 2022-09-06 MED ORDER — CLONAZEPAM 0.5 MG PO TABS: ORAL_TABLET | ORAL | 1 refills | Status: DC

## 2022-09-06 MED ORDER — CARBIDOPA-LEVODOPA 25-100 MG PO TABS
ORAL_TABLET | ORAL | 0 refills | Status: DC
Start: 2022-09-06 — End: 2022-10-25

## 2022-09-06 NOTE — Telephone Encounter (Signed)
calling for refill on clonazePAM (KLONOPIN) 0.5 MG tablet  to [facility] [address] said [facility] were going to call about refill of carbidopa-levodopa - wants to make sure that is done as well

## 2022-09-06 NOTE — Telephone Encounter (Signed)
[facility] and clonazepam to Dr. Arbutus Leas for approval

## 2022-09-09 ENCOUNTER — Other Ambulatory Visit: Payer: Self-pay | Admitting: Neurology

## 2022-09-09 ENCOUNTER — Telehealth: Payer: Self-pay | Admitting: Neurology

## 2022-09-09 DIAGNOSIS — F330 Major depressive disorder, recurrent, mild: Secondary | ICD-10-CM

## 2022-09-09 MED ORDER — CLONAZEPAM 0.5 MG PO TABS: ORAL_TABLET | ORAL | 0 refills | Status: DC

## 2022-09-09 NOTE — Telephone Encounter (Signed)
[facility] have already [facility] medication and can not cancel. Pateitns wife asking for a small amount to be sent  to [facility] for patient until the shipment arrives

## 2022-09-09 NOTE — Telephone Encounter (Signed)
Pt wife is calling in stating that the pt will be out of the clonazepam (KLONOPIN) 0.5 MG.  She is aware that it was sent in on [facility] and she stated that she was wanting to have it to a local pharmacy [facility] so that the pt would not run out.

## 2022-09-09 NOTE — Telephone Encounter (Signed)
Talked to patients wife and let her know Dr. Arbutus Leas is going to send in a small prescription until patients meds are able to get there on the 17th

## 2022-09-09 NOTE — Telephone Encounter (Signed)
Patient's wife is requesting a refill for clonazePAM . Patient has two pills left/KB

## 2022-10-25 ENCOUNTER — Other Ambulatory Visit: Payer: Self-pay

## 2022-10-25 DIAGNOSIS — G20A1 Parkinson's disease without dyskinesia, without mention of fluctuations: Secondary | ICD-10-CM

## 2022-10-25 MED ORDER — CARBIDOPA-LEVODOPA 25-100 MG PO TABS
ORAL_TABLET | ORAL | 0 refills | Status: DC
Start: 2022-10-25 — End: 2022-12-20

## 2022-12-07 ENCOUNTER — Telehealth: Payer: Self-pay | Admitting: Neurology

## 2022-12-07 DIAGNOSIS — F330 Major depressive disorder, recurrent, mild: Secondary | ICD-10-CM

## 2022-12-07 MED ORDER — CLONAZEPAM 0.5 MG PO TABS: ORAL_TABLET | ORAL | 0 refills | Status: DC

## 2022-12-07 NOTE — Telephone Encounter (Signed)
sent 

## 2022-12-07 NOTE — Telephone Encounter (Signed)
Patients wife called and LM. There was a [facility]am. It was out of stock, and now its back in stock but it could take up to 7 days to get to them. They need a [facility] [address]

## 2022-12-15 ENCOUNTER — Other Ambulatory Visit: Payer: Self-pay

## 2022-12-15 DIAGNOSIS — G20A1 Parkinson's disease without dyskinesia, without mention of fluctuations: Secondary | ICD-10-CM

## 2022-12-15 MED ORDER — INBRIJA 42 MG IN CAPS
84.0000 mg | ORAL_CAPSULE | Freq: Two times a day (BID) | RESPIRATORY_TRACT | 1 refills | Status: AC | PRN
Start: 2022-12-15 — End: ?

## 2022-12-16 NOTE — Progress Notes (Signed)
Assessment/Plan:   1.  Parkinsons Disease             -The patient underwent right STN DBS on December 10, 2011 and a left STN DBS on December 27, 2012.  Postoperatively, his left STN DBS lead was found to be displaced and had revision of the left lead on February 02, 2013 (pulled back 1 cm without MER).   IPG on the RIGHT changed on 01/23/19 and February 23, 2022.  He now has a [place] IPG.             -Patient underwent lead revision on January 16, 2019 on the left, as the lead was still displaced/deep.  It was pulled back 7.5 mm.  Postoperative imaging indicated that it was still 5 mm too deep, so when the battery was changed to the following week, it was pulled back another 5 mm.  Postoperative imaging indicates that the leads were in good place, but the patient hit his head on the corner of a wall and developed an abrasion over the lead, which ultimately exposed to lead.  This led to a wound infection, requiring that the entire system on the left be removed on January 22, 2020.  We have opted not to replace this lead, as the patient has really clinically done pretty well, but we also worry about skin integrity on the scalp.  We discussed this again today, as the right side of his body has definitely gotten slower, but ultimately I do not recommend going back and and he/his wife did not disagree.  -Increase carbidopa/levodopa 25/100, 3 at 7am, 2 at 9:30am, 3 at noon, 2 at 2:30 pm, 2 at 5pm.   -In 3 weeks, add carbidopa/levodopa 50/200 at bed  -Discussed potentially changing to Rytary, but I do not think he would be very happy with the switch, and he did not want to do that today.  -Continue amantadine, 100 mg once per day.  We tried to stop it and he actually felt worse off of it.  He thought his memory was actually worse as well.  -Continue Inbrija as needed, up to 5 times per day, separated by 2-hour intervals.  He generally gets this in 2-3 times per day  -[facility] lift,  home ramp and other equipment for the home.  He also has an aide in the home from the [place].   2.  RBD             -Continue clonazepam, 0.5 mg, 1.5 tablets at night.     3.  Urinary incontinence             -Following with urology.  Mybetriq without relief.  Gemtysa without relief.  Discussed PTNS, but told them they will need to follow-up with urology.   4.  Low back pain             -Follows with Dr. Wynn Banker as well as [facility].    5.  Depression  -Continue Lexapro, 20 mg daily.  Refilled today.  6.  Sleep apnea  -Following with neurology in [facility] (sees the PA).  On CPAP.   Subjective:   Patrick Ross was seen today.  Patient accompanied by wife who supplements history.  Patient last saw primary care May 8.  They discussed decreasing lisinopril if blood pressure was consistently less than 110.  We did stop his amantadine last visit just to see if the  dizziness got better.  But call me about a month later stating that he never completely got off of that, but he felt his memory was actually worse with decreasing the dose and he was not moving as well.  They wanted to go back on the medication.  He is only on it once per day now.  He is feeling slow and having more R hand curling and the R leg is dragging and freezing.    Current prescribed movement disorder medications: carbidopa/levodopa 25/100, 2 at 7am, 2 at 9:30am, 2 at noon, 2 at 2:30 pm, 2 at 5pm and an extra as needed (they take a 1/2 toward bedtime) Clonazepam 0.5 mg, 1-1/2 tablets at bedtime Amantadine, 100 mg once per day Inbrija (using qd - bid ) Lexapro, 20 mg daily   PREVIOUS MEDICATIONS: amantadine; klonopin; carbidopa/levodopa 25/100; carbidopa/levodopa 50/200; stalevo; Osmolex -had dizziness with higher dosages and unable to afford the $60 per month co-pay for the lower dose; entacapone (confusion); amantadine (stopped to see if dizziness got better and he felt worse off of the medicine in  terms of stiffness so he went back on it)   ALLERGIES:   Allergies  Allergen Reactions   Other Other (See Comments)    Surgical spray prior to adhesive causes blisters   Sulfamethoxazole Other (See Comments)    Unsure--childhood allergy    CURRENT MEDICATIONS:  Outpatient Encounter Medications as of 12/20/2022  Medication Sig   amantadine (SYMMETREL) 100 MG capsule Take 1 capsule (100 mg total) by mouth 2 (two) times daily.   AMBULATORY NON FORMULARY MEDICATION Lift chair Dx:  G20 (Patient taking differently: Lift chair Dx:  G20)   Ascorbic Acid 100 MG CHEW Chew by mouth.   Bacillus Coagulans-Inulin (PROBIOTIC-PREBIOTIC PO) Take 1 capsule by mouth in the morning. Doterra PB Assist+ Probiotic Defense Formula   carbidopa-levodopa (SINEMET IR) 25-100 MG tablet Take 2 tablets by mouth 5 times daily (0700,0930,1200,1430,1700). May take an extra tablet in the late evening if needed.   Cholecalciferol (VITAMIN D3 PO) Take 1 tablet by mouth daily.   clonazePAM (KLONOPIN) 0.5 MG tablet TAKE 1 AND 1/2 TABLETS BY MOUTH AT BEDTIME   cycloSPORINE (RESTASIS) 0.05 % ophthalmic emulsion Place 1 drop into both eyes 2 (two) times daily.   escitalopram (LEXAPRO) 20 MG tablet Take 1 tablet (20 mg total) by mouth daily.   FIBER PO Take 1 Dose by mouth daily as needed (Bowel health).   Levodopa (INBRIJA) 42 MG CAPS Place 84 mg into inhaler and inhale 2 (two) times daily as needed (Parkinson's). ORALLY INHALE CONTENTS OF 2 CAPSULES AS NEEDED FOR SYMPTOMS OF AN OFF PERIOD (DO NOT EXCEED 5 DOSES IN 1 DAY)   levothyroxine (SYNTHROID) 125 MCG tablet Take 125 mcg by mouth daily before breakfast.   lisinopril (ZESTRIL) 40 MG tablet Take 40 mg by mouth in the morning.   methenamine (HIPREX) 1 g tablet Take by mouth.   Multiple Vitamins-Minerals (CELLULAR SECURITY PO) Take 1 capsule by mouth at bedtime. doTERRA Alpha CRS+ (In the morning & at lunch)   Multiple Vitamins-Minerals (IMMUNE SUPPORT PO) Take 1 capsule by  mouth in the morning. doTERRA On Guard Protective Blend   Omega-3 Fatty Acids (OMEGA 3 PO) Take 2 capsules by mouth in the morning and at bedtime. Doterra xEO Mega (In the morning & at lunch) 2qam 1qhs   OVER THE COUNTER MEDICATION Take 1 tablet by mouth 2 (two) times daily. Doterra Bone & Joint   polyethylene glycol (MIRALAX /  GLYCOLAX) 17 g packet Take 17 g by mouth daily.   rosuvastatin (CRESTOR) 20 MG tablet Take 20 mg by mouth daily at 12 noon.   TURMERIC PO Take 1 tablet by mouth daily.   omeprazole (PRILOSEC) 20 MG capsule Take 1 capsule (20 mg total) by mouth 2 (two) times daily before a meal.   [DISCONTINUED] famotidine (PEPCID) 20 MG tablet Take 20 mg by mouth 2 (two) times daily.   [DISCONTINUED] mirabegron ER (MYRBETRIQ) 50 MG TB24 tablet Take 50 mg by mouth at bedtime.   No facility-administered encounter medications on file as of 12/20/2022.    Objective:   PHYSICAL EXAMINATION:    VITALS:   Vitals:   12/20/22 1421  BP: (!) 144/90  Pulse: 81  SpO2: 98%  Weight: 229 lb 3.2 oz (104 kg)  Height: 6\' 1"  (1.854 m)     GEN:  The patient appears stated age and is in NAD. HEENT:  Normocephalic, atraumatic. Cardiovascular: Regular rate rhythm Lungs: Clear to auscultation bilaterally Neck: No bruits  Neurological examination:  Orientation: The patient is alert and oriented x3. Cranial nerves: There is good facial symmetry with facial hypomimia, lips parted. The speech is fluent and clear.  He is intermittently dysarthric.  Soft palate rises symmetrically and there is no tongue deviation. Hearing is intact to conversational tone. Sensation: Sensation is intact to light touch throughout Motor: Strength is at least antigravity x4.  Movement examination: Tone: There is min increased tone in the RUE/RLE.  Tone on the L is normal Abnormal movements: none.   Coordination:  There is significant decreased RAM's on the R (worse) Gait and Station: Patient pushes off of the chair  to arise.  Ambulates well with walker.  Does well with his U step today.  Minimal freezing in the turn. I have reviewed and interpreted the following labs independently   Chemistry      Component Value Date/Time   NA 142 06/01/2022 0448   K 3.4 (L) 06/01/2022 0448   CL 109 06/01/2022 0448   CO2 28 06/01/2022 0448   BUN 20 06/01/2022 0448   CREATININE 0.73 06/01/2022 0448      Component Value Date/Time   CALCIUM 8.1 (L) 06/01/2022 0448   ALKPHOS 49 05/31/2022 0322   AST 12 (L) 05/31/2022 0322   ALT 6 05/31/2022 0322   BILITOT 0.7 05/31/2022 0322       DBS programming was performed today which is described in more detail on a separate programming procedure note.    Total time spent on today's visit was 40 minutes, including both face-to-face time and nonface-to-face time.  Time included that spent on review of records (prior notes available to me/labs/imaging if pertinent), discussing treatment and goals, answering patient's questions and coordinating care.  This did not include DBS programming time, described in more detail on separate programming procedural note.    Cc:  Loyal Jacobson, MD

## 2022-12-20 ENCOUNTER — Encounter: Payer: Self-pay | Admitting: Neurology

## 2022-12-20 ENCOUNTER — Ambulatory Visit (INDEPENDENT_AMBULATORY_CARE_PROVIDER_SITE_OTHER): Payer: Medicare Other | Admitting: Neurology

## 2022-12-20 VITALS — BP 144/90 | HR 81 | Ht 73.0 in | Wt 229.2 lb

## 2022-12-20 DIAGNOSIS — R32 Unspecified urinary incontinence: Secondary | ICD-10-CM | POA: Diagnosis not present

## 2022-12-20 DIAGNOSIS — G20A1 Parkinson's disease without dyskinesia, without mention of fluctuations: Secondary | ICD-10-CM | POA: Diagnosis not present

## 2022-12-20 DIAGNOSIS — Z9689 Presence of other specified functional implants: Secondary | ICD-10-CM

## 2022-12-20 DIAGNOSIS — G20A2 Parkinson's disease without dyskinesia, with fluctuations: Secondary | ICD-10-CM | POA: Diagnosis not present

## 2022-12-20 MED ORDER — CARBIDOPA-LEVODOPA ER 50-200 MG PO TBCR
1.0000 | EXTENDED_RELEASE_TABLET | Freq: Every day | ORAL | 1 refills | Status: DC
Start: 2022-12-20 — End: 2023-01-10

## 2022-12-20 MED ORDER — AMANTADINE HCL 100 MG PO CAPS: 100.0000 mg | ORAL_CAPSULE | Freq: Every day | ORAL | Status: DC

## 2022-12-20 MED ORDER — CARBIDOPA-LEVODOPA 25-100 MG PO TABS: ORAL_TABLET | ORAL | Status: DC

## 2022-12-20 NOTE — Patient Instructions (Signed)
Increase carbidopa/levodopa 25/100, 3 at 7am, 2 at 9:30am, 3 at noon, 2 at 2:30 pm, 2 at 5pm In 3 weeks, I want you to start carbidopa/levodopa 50/200 at bedtime

## 2022-12-20 NOTE — Procedures (Signed)
DBS Programming was performed.    Manufacturer of DBS device: Medtronic but with [employer]  Total time spent programming was 8  minutes.  Device was confirmed to be on.  Soft start was confirmed to be on.  Impedences were checked and were within normal limits.  Battery was checked and was determined to be functioning normally and not near the end of life.  Final settings were as follows with group A active:   Active Contact Amplitude (V) PW (ms) Frequency (hz) Side Effects Battery  Left Brain        Group A        12/20/19 3-C+ 3.1 70 140  2.89  01/29/20 N/A       03/03/20 N/A       05/19/20 n/a       09/16/20 N/a       12/20/22 N/a                       Group B        12/20/19 2-C+ 2.0 70 130            Right Brain        Group A        12/20/19 3-C+ 3.2 90 135  2.94  01/29/20 3-C+ 3.2 (2.4-3.6) 90 135  2.94  03/03/20 3-C+ 3.2 90 135  2.94  05/19/20 3-C+ 3.2 (2.4-3.6) 90 135  2.92  09/16/20 3-C+ 3.2 90 135  2.90  01/12/21 3-C+ 3.25 90 135  2.88  07/13/21 3-C+ 3.25 90 135  2.82  11/12/21 3-C+ 3.25 90 135  2.73  02/08/22 3-C+ 3.25 90 135  2.56  06/14/22 4-C+ 3.8 90 136    12/20/22 4-C+ 3.8 90 136

## 2023-01-10 ENCOUNTER — Telehealth: Payer: Self-pay | Admitting: Neurology

## 2023-01-10 DIAGNOSIS — G20A1 Parkinson's disease without dyskinesia, without mention of fluctuations: Secondary | ICD-10-CM

## 2023-01-10 MED ORDER — CARBIDOPA-LEVODOPA ER 50-200 MG PO TBCR: 1.0000 | EXTENDED_RELEASE_TABLET | Freq: Every day | ORAL | 1 refills | Status: DC

## 2023-01-10 MED ORDER — CARBIDOPA-LEVODOPA 25-100 MG PO TABS: ORAL_TABLET | ORAL | 1 refills | Status: DC

## 2023-01-10 NOTE — Telephone Encounter (Signed)
1. Which medications need refilled? (List name and dosage, if known) carbidopa-levodopa  2. Which pharmacy/location is medication to be sent to? (include street and city if local pharmacy) [facility]

## 2023-01-10 NOTE — Telephone Encounter (Signed)
Refills sent in for pt.  

## 2023-02-11 ENCOUNTER — Ambulatory Visit: Payer: Medicare Other | Admitting: Internal Medicine

## 2023-02-11 ENCOUNTER — Ambulatory Visit: Payer: Self-pay | Admitting: Cardiology

## 2023-03-03 ENCOUNTER — Ambulatory Visit: Payer: Medicare Other | Attending: Internal Medicine | Admitting: Cardiology

## 2023-03-03 ENCOUNTER — Encounter: Payer: Self-pay | Admitting: Cardiology

## 2023-03-03 VITALS — BP 142/80 | HR 87 | Resp 16 | Ht 73.0 in | Wt 227.0 lb

## 2023-03-03 DIAGNOSIS — G903 Multi-system degeneration of the autonomic nervous system: Secondary | ICD-10-CM | POA: Diagnosis not present

## 2023-03-03 DIAGNOSIS — I10 Essential (primary) hypertension: Secondary | ICD-10-CM | POA: Insufficient documentation

## 2023-03-03 NOTE — Patient Instructions (Signed)
 Medication Instructions:  Your physician recommends that you continue on your current medications as directed. Please refer to the Current Medication list given to you today.  *If you need a refill on your cardiac medications before your next appointment, please call your pharmacy*  Lab Work: None ordered today. If you have labs (blood work) drawn today and your tests are completely normal, you will receive your results only by: MyChart Message (if you have MyChart) OR A paper copy in the mail If you have any lab test that is abnormal or we need to change your treatment, we will call you to review the results.  Testing/Procedures: None ordered today.  Follow-Up: At [facility], you and your health needs are our priority.  As part of our continuing mission to provide you with exceptional heart care, we have created designated Provider Care Teams.  These Care Teams include your primary Cardiologist (physician) and Advanced Practice Providers (APPs -  Physician Assistants and Nurse Practitioners) who all work together to provide you with the care you need, when you need it.  We recommend signing up for the patient portal called "MyChart".  Sign up information is provided on this After Visit Summary.  MyChart is used to connect with patients for Virtual Visits (Telemedicine).  Patients are able to view lab/test results, encounter notes, upcoming appointments, etc.  Non-urgent messages can be sent to your provider as well.   To learn more about what you can do with MyChart, go to [email or web address].    Your next appointment:   As needed  The format for your next appointment:   In Person  Provider:   Tessa Lerner, DO {

## 2023-03-03 NOTE — Progress Notes (Signed)
 Cardiology Office Note:  .   Date:  03/03/2023  ID:  Patrick Ross, DOB Mar 25, 1945, MRN [id number] PCP:  Millicent Sharper, MD  Former Cardiology Providers: Dr. Annalee Casa  HeartCare Providers Cardiologist:  Madonna Large, DO , [facility] (established care 03/03/23) Electrophysiologist:  None  Click to update primary MD,subspecialty MD or APP then REFRESH:1}    Chief Complaint  Patient presents with   Essential hypertension   Follow-up    History of Present Illness: .   Patrick Ross is a 78 y.o. Caucasian male whose past medical history and cardiovascular risk factors includes: Hypertension, sleep apnea in adult, hyperlipidemia, Parkinson's disease.  Formally under the care of Dr. Annalee Custovic who last saw Patrick Ross back in December 2023. I am seeing him for the first time to re-establishing care.   Based on electronic medical records patient was referred to practice back in 2023 due to dizziness, bradycardia, and orthostatic hypotension in the setting of Parkinson's disease.  He is undergoing an echo as well as a stress test back in 2023 results reviewed and noted below for further reference.  They present today for a 1 year follow-up visit.  He is accompanied by his wife.  Over the last 1 year he denies anginal chest pain or heart failure symptoms.  Overall functional capacity is limited given his Parkinson's disease and he ambulates with a walker.  They do not check his blood pressures at home but he has not experienced any orthostasis in the recent past.   Review of Systems: .   Review of Systems  Cardiovascular:  Negative for chest pain, claudication, irregular heartbeat, leg swelling, near-syncope, orthopnea, palpitations, paroxysmal nocturnal dyspnea and syncope.  Respiratory:  Negative for shortness of breath.   Hematologic/Lymphatic: Negative for bleeding problem.    Studies Reviewed:   EKG: EKG Interpretation Date/Time:  Thursday March 03 2023 13:02:34  EST Ventricular Rate:  87 PR Interval:  178 QRS Duration:  78 QT Interval:  370 QTC Calculation: 445 R Axis:   35  Text Interpretation: Normal sinus rhythm Normal ECG When compared with ECG of 30-May-2022 12:32, No significant change since last tracing Confirmed by Large Madonna [phone]) on 03/03/2023 1:05:57 PM  Echocardiogram: 01/04/2022:  Left ventricle cavity is normal in size. Mild concentric hypertrophy of  the left ventricle. Normal global wall motion. Normal LV systolic function with EF 63%. Doppler evidence of grade I (impaired) diastolic dysfunction, normal LAP.  Trileaflet aortic valve with mild aortic valve leaflet calcification. No regurgitation.  normal right atrial pressure.   Stress Testing: Nuclear stress test November 2023: Low risk  RADIOLOGY: NA  Risk Assessment/Calculations:   NA   Labs:       Latest Ref Rng & Units 06/01/2022    4:30 PM 06/01/2022    4:48 AM 05/31/2022    4:54 PM  CBC  WBC 4.0 - 10.5 K/uL 5.0  4.5  5.4   Hemoglobin 13.0 - 17.0 g/dL 87.9  87.6  87.4   Hematocrit 39.0 - 52.0 % 37.3  38.2  38.3   Platelets 150 - 400 K/uL 160  148  160        Latest Ref Rng & Units 06/01/2022    4:48 AM 05/31/2022    3:22 AM 05/30/2022   12:42 PM  BMP  Glucose 70 - 99 mg/dL 95  98  879   BUN 8 - 23 mg/dL 20  45  43   Creatinine 0.61 - 1.24 mg/dL  0.73  0.77  1.20   Sodium 135 - 145 mmol/L 142  143  134   Potassium 3.5 - 5.1 mmol/L 3.4  3.6  3.3   Chloride 98 - 111 mmol/L 109  113  103   CO2 22 - 32 mmol/L 28  24  21    Calcium  8.9 - 10.3 mg/dL 8.1  7.7  7.7       Latest Ref Rng & Units 06/01/2022    4:48 AM 05/31/2022    3:22 AM 05/30/2022   12:42 PM  CMP  Glucose 70 - 99 mg/dL 95  98  879   BUN 8 - 23 mg/dL 20  45  43   Creatinine 0.61 - 1.24 mg/dL 9.26  9.22  8.79   Sodium 135 - 145 mmol/L 142  143  134   Potassium 3.5 - 5.1 mmol/L 3.4  3.6  3.3   Chloride 98 - 111 mmol/L 109  113  103   CO2 22 - 32 mmol/L 28  24  21    Calcium  8.9 - 10.3 mg/dL 8.1  7.7   7.7   Total Protein 6.5 - 8.1 g/dL  5.7  6.5   Total Bilirubin 0.3 - 1.2 mg/dL  0.7  1.1   Alkaline Phos 38 - 126 U/L  49  62   AST 15 - 41 U/L  12  16   ALT 0 - 44 U/L  6  7     No results found for: CHOL, HDL, LDLCALC, LDLDIRECT, TRIG, CHOLHDL No results for input(s): LIPOA in the last 8760 hours. No components found for: NTPROBNP No results for input(s): PROBNP in the last 8760 hours. No results for input(s): TSH in the last 8760 hours.  Physical Exam:    Today's Vitals   03/03/23 1258  BP: (!) 142/80  Pulse: 87  Resp: 16  SpO2: 92%  Weight: 227 lb (103 kg)  Height: 6' 1 (1.854 m)   Body mass index is 29.95 kg/m. Wt Readings from Last 3 Encounters:  03/03/23 227 lb (103 kg)  12/20/22 229 lb 3.2 oz (104 kg)  06/14/22 229 lb 6.4 oz (104.1 kg)    Physical Exam  Constitutional: No distress.  hemodynamically stable  HENT:  Hearing aids  Neck: No JVD present.  Cardiovascular: Normal rate, regular rhythm, S1 normal and S2 normal. Exam reveals no gallop, no S3 and no S4.  No murmur heard. Pulmonary/Chest: Effort normal and breath sounds normal. No stridor. He has no wheezes. He has no rales.  Right anterior chest wall-deep brain stimulator  Abdominal: Soft. Bowel sounds are normal. He exhibits no distension. There is no abdominal tenderness.  Musculoskeletal:        General: No edema.     Cervical back: Neck supple.  Neurological: He is alert and oriented to person, place, and time.  Skin: Skin is warm.   Impression & Recommendation(s):  Impression:   ICD-10-CM   1. Essential hypertension  I10 EKG 12-Lead    2. 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  He has undergone appropriate cardiovascular workup in 2023 as outlined above.  He presents today for  annual follow-up visit with his wife.  His wife provides majority of the history of present illness.  No reoccurrence of orthostasis.  And he denies near-syncope or  syncopal events.  Office blood pressures are acceptable but not at goal.  Given his history of orthostatic hypotension and underlying Parkinson disease he is at risk of falls and would like to avoid hypotension.  Recommend a goal SBP around 135 mmHg.  Avoid vasodilator therapy and diuretics if possible.  No additional testing is warranted at this time.  Given his age recommend regular follow-up with PCP to manage his cardiovascular risk factors-glycemic control, blood pressure management, lipid management.  Patient's wife states that his primary care provider is managing his lipids.  I do not have a copy for reference.  Shared decision was to follow-up on as needed basis sooner if needed.  Orders Placed:  Orders Placed This Encounter  Procedures   EKG 12-Lead    Final Medication List:   No orders of the defined types were placed in this encounter.   Medications Discontinued During This Encounter  Medication Reason   carbidopa -levodopa  (SINEMET  IR) 25-100 MG tablet Patient Preference   FIBER PO    polyethylene glycol (MIRALAX  / GLYCOLAX ) 17 g packet Patient Preference     Current Outpatient Medications:    amantadine  (SYMMETREL ) 100 MG capsule, Take 1 capsule (100 mg total) by mouth daily., Disp: , Rfl:    AMBULATORY NON FORMULARY MEDICATION, Lift chair Dx:  G20 (Patient taking differently: Lift chair Dx:  G20), Disp: 1 Device, Rfl: 0   Ascorbic Acid 100 MG CHEW, Chew by mouth., Disp: , Rfl:    Bacillus Coagulans-Inulin (PROBIOTIC-PREBIOTIC PO), Take 1 capsule by mouth in the morning. Doterra PB Assist+ Probiotic Defense Formula, Disp: , Rfl:    carbidopa -levodopa  (SINEMET  CR) 50-200 MG tablet, Take 1 tablet by mouth at bedtime., Disp: 90 tablet, Rfl: 1   Cholecalciferol (VITAMIN D3 PO), Take 1 tablet by mouth daily., Disp:  , Rfl:    clonazePAM  (KLONOPIN ) 0.5 MG tablet, TAKE 1 AND 1/2 TABLETS BY MOUTH AT BEDTIME, Disp: 12 tablet, Rfl: 0   cycloSPORINE (RESTASIS) 0.05 % ophthalmic emulsion, Place 1 drop into both eyes 2 (two) times daily., Disp: , Rfl:    escitalopram  (LEXAPRO ) 20 MG tablet, Take 1 tablet (20 mg total) by mouth daily., Disp: 270 tablet, Rfl: 2   Levodopa  (INBRIJA ) 42 MG CAPS, Place 84 mg into inhaler and inhale 2 (two) times daily as needed (Parkinson's). ORALLY INHALE CONTENTS OF 2 CAPSULES AS NEEDED FOR SYMPTOMS OF AN OFF PERIOD (DO NOT EXCEED 5 DOSES IN 1 DAY), Disp: 300 capsule, Rfl: 1   levothyroxine  (SYNTHROID ) 125 MCG tablet, Take 125 mcg by mouth daily before breakfast., Disp: , Rfl:    lisinopril  (ZESTRIL ) 40 MG tablet, Take 40 mg by mouth in the morning., Disp: , Rfl:    methenamine (HIPREX) 1 g tablet, Take by mouth., Disp: , Rfl:    Multiple Vitamins-Minerals (CELLULAR SECURITY PO), Take 1 capsule by mouth at bedtime. doTERRA Alpha CRS+ (In the morning & at lunch), Disp: , Rfl:    Multiple Vitamins-Minerals (IMMUNE SUPPORT PO), Take 1 capsule by mouth in the morning. doTERRA On [employer], Disp: , Rfl:    Omega-3 Fatty Acids (OMEGA 3 PO), Take 2 capsules by mouth in the morning and at bedtime. Doterra xEO Mega (In the morning & at lunch) 2qam 1qhs, Disp: , Rfl:    omeprazole  (PRILOSEC) 20 MG capsule, Take 1 capsule (20 mg total) by mouth 2 (two) times daily before a meal., Disp: 60 capsule, Rfl: 1   OVER THE COUNTER MEDICATION, Take 1 tablet by mouth  2 (two) times daily. Doterra Bone & Joint, Disp: , Rfl:    rosuvastatin  (CRESTOR ) 20 MG tablet, Take 20 mg by mouth daily at 12 noon., Disp: , Rfl:    TURMERIC PO, Take 1 tablet by mouth daily., Disp: , Rfl:   Consent:   N/A  Disposition:   As needed  His questions and concerns were addressed to his satisfaction. He voices understanding of the recommendations provided during this encounter.    Signed, Madonna Large, DO,  [facility]  [facility]  [address] [place], [place] [address] 03/03/2023 1:22 PM

## 2023-03-04 ENCOUNTER — Other Ambulatory Visit: Payer: Self-pay | Admitting: Neurology

## 2023-03-04 DIAGNOSIS — F330 Major depressive disorder, recurrent, mild: Secondary | ICD-10-CM

## 2023-03-04 MED ORDER — CLONAZEPAM 0.5 MG PO TABS: ORAL_TABLET | ORAL | 1 refills | Status: DC

## 2023-03-04 NOTE — Telephone Encounter (Signed)
 1. Which medications need refilled? (List name and dosage, if known) CLONAZEPAM  2. Which pharmacy/location is medication to be sent to? (include street and city if local pharmacy) [facility]

## 2023-03-24 ENCOUNTER — Other Ambulatory Visit: Payer: Self-pay | Admitting: Neurology

## 2023-03-24 DIAGNOSIS — G20A2 Parkinson's disease without dyskinesia, with fluctuations: Secondary | ICD-10-CM

## 2023-06-20 NOTE — Progress Notes (Unsigned)
 Assessment/Plan:   1.  Parkinsons Disease             -The patient underwent right STN DBS on December 10, 2011 and a left STN DBS on December 27, 2012.  Postoperatively, his left STN DBS lead was found to be displaced and had revision of the left lead on February 02, 2013 (pulled back 1 cm without MER).   IPG on the RIGHT changed on 01/23/19 and February 23, 2022.  He now has a [place] IPG.             -Patient underwent lead revision on January 16, 2019 on the left, as the lead was still displaced/deep.  It was pulled back 7.5 mm.  Postoperative imaging indicated that it was still 5 mm too deep, so when the battery was changed to the following week, it was pulled back another 5 mm.  Postoperative imaging indicates that the leads were in good place, but the patient hit his head on the corner of a wall and developed an abrasion over the lead, which ultimately exposed to lead.  This led to a wound infection, requiring that the entire system on the left be removed on January 22, 2020.  We have opted not to replace this lead, as the patient has really clinically done pretty well, but we also worry about skin integrity on the scalp.  We discussed this again today, as the right side of his body has definitely gotten slower, but ultimately I do not recommend going back and and he/his wife did not disagree.  - Continue carbidopa /levodopa  25/100, 3 at 7am, 2 at 9:30am, 3 at noon, 2 at 2:30 pm, 2 at 5pm.   -*** carbidopa /levodopa  50/200 at bed  -Discussed potentially changing to Rytary , but I do not think he would be very happy with the switch, and he did not want to do that today.  -Continue amantadine , 100 mg once per day.  We tried to stop it and he actually felt worse off of it.  He thought his memory was actually worse as well.  -Continue Inbrija  as needed, up to 5 times per day, separated by 2-hour intervals.  He generally gets this in 2-3 times per day  -[facility], home ramp  and other equipment for the home.  He also has an aide in the home from the [place].   2.  RBD             -Continue clonazepam , 0.5 mg, 1.5 tablets at night.     3.  Urinary incontinence             -Following with urology.  Mybetriq without relief.  Gemtysa without relief.  Discussed PTNS, but told them they will need to follow-up with urology.   4.  Low back pain             -Follows with Dr. Sharl Davies as well as [facility] neurosurgery.    5.  Depression  -Continue Lexapro , 20 mg daily.  Refilled today.  6.  Sleep apnea  -Following with neurology in [facility] (sees the PA).  On CPAP.   Subjective:   ALPHUS ZECK was seen today.  Patient accompanied by wife who supplements history.  He has had no falls since last visit.  His levodopa  was increased in the daytime and I started bedtime levodopa  CR for cramping and curling of the toes.  He reports that ***.  Current prescribed movement disorder medications: carbidopa /levodopa  25/100,  3 at 7am, 2 at 9:30am, 3 at noon, 2 at 2:30 pm, 2 at 5pm (increased) Carbidopa /levodopa  50/200 at bedtime (started last visit) Clonazepam  0.5 mg, 1-1/2 tablets at bedtime Amantadine , 100 mg once per day Inbrija  (using qd - bid ) Lexapro , 20 mg daily   PREVIOUS MEDICATIONS: amantadine ; klonopin ; carbidopa /levodopa  25/100; carbidopa /levodopa  50/200; stalevo ; Osmolex  -had dizziness with higher dosages and unable to afford the $60 per month co-pay for the lower dose; entacapone  (confusion); amantadine  (stopped to see if dizziness got better and he felt worse off of the medicine in terms of stiffness so he went back on it)   ALLERGIES:   Allergies  Allergen Reactions   Other Other (See Comments)    Surgical spray prior to adhesive causes blisters   Sulfamethoxazole Other (See Comments)    Unsure--childhood allergy    CURRENT MEDICATIONS:  Outpatient Encounter Medications as of 06/21/2023  Medication Sig   amantadine  (SYMMETREL ) 100 MG  capsule Take 1 capsule (100 mg total) by mouth daily.   AMBULATORY NON FORMULARY MEDICATION Lift chair Dx:  G20 (Patient taking differently: Lift chair Dx:  G20)   Ascorbic Acid 100 MG CHEW Chew by mouth.   Bacillus Coagulans-Inulin (PROBIOTIC-PREBIOTIC PO) Take 1 capsule by mouth in the morning. Doterra PB Assist+ Probiotic Defense Formula   carbidopa -levodopa  (SINEMET  CR) 50-200 MG tablet Take 1 tablet by mouth at bedtime.   Cholecalciferol (VITAMIN D3 PO) Take 1 tablet by mouth daily.   clonazePAM  (KLONOPIN ) 0.5 MG tablet TAKE 1 AND 1/2 TABLETS BY MOUTH AT BEDTIME   cycloSPORINE (RESTASIS) 0.05 % ophthalmic emulsion Place 1 drop into both eyes 2 (two) times daily.   escitalopram  (LEXAPRO ) 20 MG tablet Take 1 tablet (20 mg total) by mouth daily.   Levodopa  (INBRIJA ) 42 MG CAPS Place 84 mg into inhaler and inhale 2 (two) times daily as needed (Parkinson's). ORALLY INHALE CONTENTS OF 2 CAPSULES AS NEEDED FOR SYMPTOMS OF AN OFF PERIOD (DO NOT EXCEED 5 DOSES IN 1 DAY)   levothyroxine  (SYNTHROID ) 125 MCG tablet Take 125 mcg by mouth daily before breakfast.   lisinopril  (ZESTRIL ) 40 MG tablet Take 40 mg by mouth in the morning.   methenamine (HIPREX) 1 g tablet Take by mouth.   Multiple Vitamins-Minerals (CELLULAR SECURITY PO) Take 1 capsule by mouth at bedtime. doTERRA Alpha CRS+ (In the morning & at lunch)   Multiple Vitamins-Minerals (IMMUNE SUPPORT PO) Take 1 capsule by mouth in the morning. doTERRA On Guard Protective Blend   Omega-3 Fatty Acids (OMEGA 3 PO) Take 2 capsules by mouth in the morning and at bedtime. Doterra xEO Mega (In the morning & at lunch) 2qam 1qhs   omeprazole  (PRILOSEC) 20 MG capsule Take 1 capsule (20 mg total) by mouth 2 (two) times daily before a meal.   OVER THE COUNTER MEDICATION Take 1 tablet by mouth 2 (two) times daily. Doterra Bone & Joint   rosuvastatin  (CRESTOR ) 20 MG tablet Take 20 mg by mouth daily at 12 noon.   TURMERIC PO Take 1 tablet by mouth daily.   No  facility-administered encounter medications on file as of 06/21/2023.    Objective:   PHYSICAL EXAMINATION:    VITALS:   There were no vitals filed for this visit.    GEN:  The patient appears stated age and is in NAD. HEENT:  Normocephalic, atraumatic. Cardiovascular: Regular rate rhythm Lungs: Clear to auscultation bilaterally Neck: No bruits  Neurological examination:  Orientation: The  patient is alert and oriented x3. Cranial nerves: There is good facial symmetry with facial hypomimia, lips parted. The speech is fluent and clear.  He is intermittently dysarthric.  Soft palate rises symmetrically and there is no tongue deviation. Hearing is intact to conversational tone. Sensation: Sensation is intact to light touch throughout Motor: Strength is at least antigravity x4.  Movement examination: Tone: There is min increased tone in the RUE/RLE.  Tone on the L is normal Abnormal movements: none.   Coordination:  There is significant decreased RAM's on the R (worse) Gait and Station: Patient pushes off of the chair to arise.  Ambulates well with walker.  Does well with his U step today.  Minimal freezing in the turn. I have reviewed and interpreted the following labs independently   Chemistry      Component Value Date/Time   NA 142 06/01/2022 0448   K 3.4 (L) 06/01/2022 0448   CL 109 06/01/2022 0448   CO2 28 06/01/2022 0448   BUN 20 06/01/2022 0448   CREATININE 0.73 06/01/2022 0448      Component Value Date/Time   CALCIUM  8.1 (L) 06/01/2022 0448   ALKPHOS 49 05/31/2022 0322   AST 12 (L) 05/31/2022 0322   ALT 6 05/31/2022 0322   BILITOT 0.7 05/31/2022 0322       DBS programming was performed today which is described in more detail on a separate programming procedure note.    Total time spent on today's visit was *** minutes, including both face-to-face time and nonface-to-face time.  Time included that spent on review of records (prior notes available to  me/labs/imaging if pertinent), discussing treatment and goals, answering patient's questions and coordinating care.  This did not include DBS programming time, described in more detail on separate programming procedural note.    Cc:  Jayne Mews, MD

## 2023-06-21 ENCOUNTER — Ambulatory Visit (INDEPENDENT_AMBULATORY_CARE_PROVIDER_SITE_OTHER): Payer: TRICARE For Life (TFL) | Admitting: Neurology

## 2023-06-21 VITALS — BP 144/88 | HR 81 | Ht 73.0 in | Wt 236.0 lb

## 2023-06-21 DIAGNOSIS — G20A1 Parkinson's disease without dyskinesia, without mention of fluctuations: Secondary | ICD-10-CM | POA: Diagnosis not present

## 2023-06-21 DIAGNOSIS — R413 Other amnesia: Secondary | ICD-10-CM | POA: Diagnosis not present

## 2023-06-21 DIAGNOSIS — F330 Major depressive disorder, recurrent, mild: Secondary | ICD-10-CM | POA: Diagnosis not present

## 2023-06-21 DIAGNOSIS — G20A2 Parkinson's disease without dyskinesia, with fluctuations: Secondary | ICD-10-CM | POA: Diagnosis not present

## 2023-06-21 DIAGNOSIS — G20B2 Parkinson's disease with dyskinesia, with fluctuations: Secondary | ICD-10-CM

## 2023-06-21 DIAGNOSIS — G249 Dystonia, unspecified: Secondary | ICD-10-CM

## 2023-06-21 DIAGNOSIS — G4752 REM sleep behavior disorder: Secondary | ICD-10-CM

## 2023-06-21 MED ORDER — AMANTADINE HCL 100 MG PO CAPS: 100.0000 mg | ORAL_CAPSULE | Freq: Every day | ORAL | 2 refills | Status: AC

## 2023-06-21 MED ORDER — ESCITALOPRAM OXALATE 20 MG PO TABS: 20.0000 mg | ORAL_TABLET | Freq: Every day | ORAL | 2 refills | Status: DC

## 2023-06-21 MED ORDER — CARBIDOPA-LEVODOPA ER 50-200 MG PO TBCR: 1.0000 | EXTENDED_RELEASE_TABLET | Freq: Two times a day (BID) | ORAL | 1 refills | Status: AC

## 2023-06-21 MED ORDER — CARBIDOPA-LEVODOPA 25-100 MG PO TABS: ORAL_TABLET | ORAL | 1 refills | Status: AC

## 2023-06-21 NOTE — Procedures (Signed)
 DBS Programming was performed.    Manufacturer of DBS device: Medtronic but with [employer]  Total time spent programming was 10  minutes.  Device was confirmed to be on.  Soft start was confirmed to be on.  Impedences were checked and were within normal limits.  Battery was checked and was determined to be functioning normally and not near the end of life.  Final settings were as follows with group A active:   Active Contact Amplitude (V) PW (ms) Frequency (hz) Side Effects Battery  Left Brain        Group A        12/20/19 3-C+ 3.1 70 140  2.89  01/29/20 N/A       03/03/20 N/A       05/19/20 n/a       09/16/20 N/a       12/20/22 N/a       06/21/23 N/a               Group B        12/20/19 2-C+ 2.0 70 130            Right Brain        Group A        12/20/19 3-C+ 3.2 90 135  2.94  01/29/20 3-C+ 3.2 (2.4-3.6) 90 135  2.94  03/03/20 3-C+ 3.2 90 135  2.94  05/19/20 3-C+ 3.2 (2.4-3.6) 90 135  2.92  09/16/20 3-C+ 3.2 90 135  2.90  01/12/21 3-C+ 3.25 90 135  2.88  07/13/21 3-C+ 3.25 90 135  2.82  11/12/21 3-C+ 3.25 90 135  2.73  02/08/22 3-C+ 3.25 90 135  2.56  06/14/22 4-C+ 3.8 90 136    12/20/22 4-C+ 3.8 90 136    06/21/23 4-C+ 3.8 90 136

## 2023-06-21 NOTE — Patient Instructions (Addendum)
 Take  carbidopa /levodopa  25/100, 2 at 7am (decrease), 2 at 9:30am, 3 at noon, 2 at 2:30 pm, 2 at 5pm.  Take carbidopa /levodopa  50/200 at 7am and bedtime  You have been referred for a neurocognitive evaluation (i.e., evaluation of memory and thinking abilities). Please bring someone with you to this appointment if possible, as it is helpful for the neuropsychologist to hear from both you and another adult who knows you well. Please bring eyeglasses and hearing aids if you wear them and take any medications as you normally would. Please fully abstain from all alcohol, marijuana, or other substances prior to your appointment.   The evaluation will take approximately 2-3 hours and has two parts:   The first part is a clinical interview with the neuropsychologist, Dr. Kitty Perkins or Dr. Donavon Fudge. During the interview, the neuropsychologist will speak with you and the individual you brought to the appointment.    The second part of the evaluation is testing with the doctor's technician, aka psychometrician, Dana or [employer]. During the testing, the technician will ask you to remember different types of material, solve problems, and answer some questionnaires. Your family member will not be present for this portion of the evaluation.   Please note: We have to reserve several hours of the neuropsychologist's time and the psychometrician's time for your evaluation appointment. As such, there is a No-Show fee of $100. If you are unable to attend any of your appointments, please contact our office as soon as possible to reschedule.

## 2023-06-21 NOTE — Addendum Note (Signed)
 Addended by: Lockie Rima on: 06/21/2023 03:21 PM   Modules accepted: Orders

## 2023-06-22 ENCOUNTER — Encounter: Payer: Self-pay | Admitting: Physical Medicine & Rehabilitation

## 2023-07-06 ENCOUNTER — Encounter: Payer: Self-pay | Admitting: Speech Pathology

## 2023-07-06 ENCOUNTER — Ambulatory Visit: Attending: Neurology

## 2023-07-06 ENCOUNTER — Other Ambulatory Visit: Payer: Self-pay

## 2023-07-06 ENCOUNTER — Ambulatory Visit: Admitting: Speech Pathology

## 2023-07-06 ENCOUNTER — Telehealth: Payer: Self-pay | Admitting: Neurology

## 2023-07-06 DIAGNOSIS — R1310 Dysphagia, unspecified: Secondary | ICD-10-CM | POA: Insufficient documentation

## 2023-07-06 DIAGNOSIS — R471 Dysarthria and anarthria: Secondary | ICD-10-CM | POA: Diagnosis present

## 2023-07-06 DIAGNOSIS — R262 Difficulty in walking, not elsewhere classified: Secondary | ICD-10-CM | POA: Insufficient documentation

## 2023-07-06 DIAGNOSIS — R278 Other lack of coordination: Secondary | ICD-10-CM | POA: Diagnosis present

## 2023-07-06 DIAGNOSIS — R41841 Cognitive communication deficit: Secondary | ICD-10-CM | POA: Diagnosis present

## 2023-07-06 DIAGNOSIS — G20B2 Parkinson's disease with dyskinesia, with fluctuations: Secondary | ICD-10-CM

## 2023-07-06 NOTE — Therapy (Unsigned)
 OUTPATIENT SPEECH LANGUAGE PATHOLOGY EVALUATION   Patient Name: Patrick Ross MRN: [id number] DOB:1945-08-29, 78 y.o., male Today's Date: 07/07/2023  PCP: Jayne Mews, MD REFERRING PROVIDER: Shirline Dover, DO  END OF SESSION:  End of Session - 07/07/23 0914     Visit Number 1    Number of Visits 17    Date for SLP Re-Evaluation 09/06/23    SLP Start Time 0930    SLP Stop Time  1010    SLP Time Calculation (min) 40 min    Activity Tolerance Patient tolerated treatment well             Past Medical History:  Diagnosis Date   Depression    DVT (deep venous thrombosis) (HCC)    GERD (gastroesophageal reflux disease)    History of kidney stones    passed   Hypertension    Hypothyroidism    Lumbar stenosis    OSA (obstructive sleep apnea)    wears cpap   Parkinson's disease (HCC)    PONV (postoperative nausea and vomiting)    REM behavioral disorder    Urinary incontinence    Past Surgical History:  Procedure Laterality Date   BACK SURGERY     x 2 - not fusion   BIOPSY  06/01/2022   Procedure: BIOPSY;  Surgeon: Baldo Bonds, MD;  Location: [facility];  Service: Gastroenterology;;   COLONOSCOPY     DEEP BRAIN STIMULATOR PLACEMENT     left 12/27/2012, right 12/10/2011, left revision 02/02/2013, replacement bilateral 11/21/2015   ESOPHAGOGASTRODUODENOSCOPY (EGD) WITH PROPOFOL  N/A 06/01/2022   Procedure: ESOPHAGOGASTRODUODENOSCOPY (EGD) WITH PROPOFOL ;  Surgeon: Baldo Bonds, MD;  Location: [facility];  Service: Gastroenterology;  Laterality: N/A;   MINOR PLACEMENT OF FIDUCIAL N/A 01/09/2019   Procedure: Fiducial placement;  Surgeon: Manya Sells, MD;  Location: [facility] OR;  Service: Neurosurgery;  Laterality: N/A;  Fiducial placement   PARTIAL NEPHRECTOMY Right 2005 ish   PULSE GENERATOR IMPLANT Right 01/23/2019   Procedure: Right chest implantable pulse generator change;  Surgeon: Manya Sells, MD;  Location: [facility] OR;  Service: Neurosurgery;  Laterality:  Right;   SPINAL FUSION     lumbar Fusion x 2   SUBTHALAMIC STIMULATOR BATTERY REPLACEMENT Left 02/02/2018   Procedure: Change implantable pulse generator battery, Left chest;  Surgeon: Manya Sells, MD;  Location: [facility] OR;  Service: Neurosurgery;  Laterality: Left;   SUBTHALAMIC STIMULATOR BATTERY REPLACEMENT N/A 01/22/2020   Procedure: REMOVAL OF LEFT BRAIN DEEP BRAIN STIMULATOR ELECTRODE, EXTENSION, AND PULSE GENERATOR;  Surgeon: Manya Sells, MD;  Location: [facility] OR;  Service: Neurosurgery;  Laterality: N/A;   SUBTHALAMIC STIMULATOR BATTERY REPLACEMENT Right 02/23/2022   Procedure: CHANGE IPG BATTERY, RT CHEST;  Surgeon: Dawley, Colby Daub, DO;  Location: [facility] OR;  Service: Neurosurgery;  Laterality: Right;   SUBTHALAMIC STIMULATOR INSERTION Left 01/16/2019   Procedure: Left deep brain stimulator electrode revision.;  Surgeon: Manya Sells, MD;  Location: [facility] OR;  Service: Neurosurgery;  Laterality: Left;   SUBTHALAMIC STIMULATOR INSERTION Left 01/23/2019   Procedure: Repositioning of Left Deep Brain Stimulator Electrode;  Surgeon: Manya Sells, MD;  Location: [facility] OR;  Service: Neurosurgery;  Laterality: Left;   TONSILLECTOMY     Patient Active Problem List   Diagnosis Date Noted   Melena 06/01/2022   Acute cystitis without hematuria 05/31/2022   Upper GI bleed 05/31/2022   Generalized weakness 05/30/2022   Abdominal pain 05/30/2022   OSA (obstructive sleep apnea) 05/30/2022   Hypothyroidism 05/30/2022   Orthostatic hypotension due to  Parkinson's disease (HCC) 12/14/2021   Mixed hyperlipidemia 12/14/2021   Essential hypertension 12/14/2021   Palpitations 12/14/2021   Angina pectoris (HCC) 12/14/2021   Urge incontinence 01/15/2021   History of UTI 01/15/2021   Abnormal urine findings 01/15/2021   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   History of nephrolithiasis 01/15/2021   DVT, lower extremity, proximal, acute, right (HCC) 02/26/2020   Open scalp wound 01/22/2020   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HCC)     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   Parkinson's disease (HCC) 01/16/2018   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   Pt accompanied by: significant other; [name]  PERTINENT HISTORY: PD (hx of STN DBS), depression, sleep apnea  PAIN:  Are you having pain? No  FALLS: Has patient fallen in last 6 months?  Yes  LIVING ENVIRONMENT: Lives with: lives with their family; 1 Lives in: House/apartment  PLOF:  Level of assistance: Independent with ADLs, Independent with IADLs Employment: Retired  PATIENT GOALS: voice, cognition  OBJECTIVE:  Note: Objective measures were completed at Evaluation unless otherwise noted.  DIAGNOSTIC FINDINGS: No imaging on this EMR since 2021 CT Head.   COGNITION: Overall cognitive status: Impaired Areas of impairment:  Attention: Impaired: Selective, Alternating, Divided Memory: Impaired: Short term Prospective Executive function: Impaired: Organization and Planning Functional deficits: Difficulty with recall of details  MOTOR SPEECH: Overall motor speech: impaired Level of impairment: Conversation Respiration: Reduced support Phonation: normal Resonance: WFL Articulation: Impaired: conversation Intelligibility: Intelligibility reduced Motor planning: Appears intact Motor speech errors: NA Interfering components: NA Effective technique: slow rate, increased vocal intensity, and over articulate  ORAL MOTOR EXAMINATION: Overall status: WFL Comments:    CLINICAL SWALLOW ASSESSMENT:    Current diet: regular and thin liquids Dentition: adequate natural dentition Patient directly observed with POs: Yes: thin liquids  Feeding: able to feed self Liquids provided by: cup and straw Oral phase signs and symptoms: NA Pharyngeal phase signs and symptoms: multiple swallows Comments: [facility] Swallow Protocol PASS   *Pt reports some difficulty with drier foods and occasionally getting strangled with liquids. No observed this date. To monitor.   SOCIAL HISTORY: Occupation: Warden/ranger intake: suboptimal Caffeine/alcohol intake: minimal Daily voice use: minimal  PERCEPTUAL VOICE ASSESSMENT: Voice quality: normal Vocal abuse: NA Resonance: normal Respiratory function: thoracic breathing  LSVT-LOUD VOICE EVALUATION: Maximum phonation time for sustained "ah": - Mean intensity during sustained "ah": - dB  Mean fundamental frequency during sustained "ah": - Hz (- STD below average of 145 Hz +/- 57for age and gender) Mean intensity sustained during conversational speech: - dB Habitual pitch: - Hz Highest dynamic pitch when altering pitch from a low note to a high note: - Hz Lowest dynamic pitch when altering from a high note to a low note: - Hz Highest dynamic pitch during conversational speech: - Hz Lowest dynamic pitch during conversational speech: - Hz Average time patient was able to sustain /s/: - seconds Average time patient was able to sustain /z/: - seconds s/z ratio : -  Speech is characterized by Other: . For trained listener in quiet environment with context, intelligibility was -%.   Patient able to improve all  parameters with model ("Loud like me") Stimulability: Improved vocal quality with loud voice (- dB) for sustained vowel, maximum high and low phonation improved to range -, and functional phrases averaged -. Improved pitch range with model.     The Communication Effectiveness Survey is a patient-reported outcome measure in which the patient rates their own  effectiveness in different communication situations. A higher score indicates greater effectiveness. Pt's self-rating was -/32. Patient reported -.   STANDARDIZED ASSESSMENTS: SLUMS: 24/30  PATIENT REPORTED OUTCOME MEASURES (PROM): Not completed                                                                                                                            TREATMENT DATE:    PATIENT EDUCATION: Education details: PD and SLP role  Person educated: Patient and Spouse Education method: Explanation Education comprehension: verbalized understanding   GOALS: Goals reviewed with patient? Yes  SHORT TERM GOALS: Target date: 08/07/23  Pt will complete baseline voice measures Baseline: Goal status: INITIAL  2.  Pt will complete PROMs Baseline:  Goal status: INITIAL  3.  Pt will demonstrate voice HEP independently Baseline:  Goal status: INITIAL  4.  Pt/wife will verbalize 3 memory strategies to improve recall of important information Baseline:  Goal status: INITIAL  5.   Baseline:  Goal status: INITIAL  6.   Baseline:  Goal status: INITIAL  LONG TERM GOALS: Target date: 09/06/23  Pt will improve score on PROMs Baseline:  Goal status: INITIAL  2.  Pt will demonstrate improved vocal intensity with sustained phonation Baseline:  Goal status: INITIAL  3.  Pt will demonstrate improved vocal intensity in 5 min, unstructured conversation Baseline:  Goal status: INITIAL  4.  Pt/wife will report success using memory strategies at home Baseline:  Goal status: INITIAL  5.   Baseline:  Goal status: INITIAL  6.   Baseline:  Goal status: INITIAL  ASSESSMENT:  CLINICAL IMPRESSION: Pt is a 78 yo male who presents to ST OP for evaluation for sx 2/2 Parkinson's Disease.  Pt endorses changes in vocal intensity, breath support, and cognition re: focus, memory. Pt has received speech therapy before at [facility] ~2 years ago. Wife reports he has been  referred for a neuropsych evaluation for cognition; therefore, SLP to just complete a screen at this time. Pt was assessed using SLUMS - scoring a 24/30. Impairments noted in recall and word generation. Pt with complaints of occasional difficulty with thin liquids and solids. Pt passed YSP, with no overt s/sx of aspiration. Pt reports he has some difficulty with drier solids getting "stuck". SLP suggested moister meats re: chicken thighs vs chicken breast, cutting items into smaller pieces, and adding sauces/gravies to drier foods. Unable to complete voice measures due to time constraints. To complete next session. SLP rec skilled ST services to address cognitive-communication impairment, dysarthria, and monitor dysphagia.    OBJECTIVE IMPAIRMENTS: include attention, memory, executive functioning, and dysarthria. These impairments  are limiting patient from effectively communicating at home and in community. Factors affecting potential to achieve goals and functional outcome are NA. Patient will benefit from skilled SLP services to address above impairments and improve overall function.  REHAB POTENTIAL: Good  PLAN:  SLP FREQUENCY: 1-2x/week  SLP DURATION: 8 weeks  PLANNED INTERVENTIONS: Diet toleration management , Cueing hierachy, Internal/external aids, Functional tasks, Multimodal communication approach, SLP instruction and feedback, Compensatory strategies, Patient/family education, and [id number] Treatment of speech (30 or 45 min)     [employer], CCC-SLP 07/07/2023, 9:14 AM

## 2023-07-06 NOTE — Telephone Encounter (Signed)
 Pt.s wife needs note for excused Jury duty by 07/31/23, Barb Levers  Please mail Letter [address] [place], [place] [address]

## 2023-07-06 NOTE — Therapy (Signed)
 OUTPATIENT PHYSICAL THERAPY NEURO EVALUATION   Patient Name: Patrick Ross MRN: [id number] DOB:[birth year], 78 y.o., male Today's Date: 07/06/2023   PCP: Jayne Mews MD REFERRING PROVIDER: Shirline Dover DO  END OF SESSION:  PT End of Session - 07/06/23 1033     Visit Number 1    Date for PT Re-Evaluation 09/28/23    Progress Note Due on Visit 10    PT Start Time 0845    PT Stop Time 0932    PT Time Calculation (min) 47 min    Activity Tolerance Patient tolerated treatment well    Behavior During Therapy WFL for tasks assessed/performed             Past Medical History:  Diagnosis Date   Depression    DVT (deep venous thrombosis) (HCC)    GERD (gastroesophageal reflux disease)    History of kidney stones    passed   Hypertension    Hypothyroidism    Lumbar stenosis    OSA (obstructive sleep apnea)    wears cpap   Parkinson's disease (HCC)    PONV (postoperative nausea and vomiting)    REM behavioral disorder    Urinary incontinence    Past Surgical History:  Procedure Laterality Date   BACK SURGERY     x 2 - not fusion   BIOPSY  06/01/2022   Procedure: BIOPSY;  Surgeon: Baldo Bonds, MD;  Location: [facility];  Service: Gastroenterology;;   COLONOSCOPY     DEEP BRAIN STIMULATOR PLACEMENT     left 12/27/2012, right 12/10/2011, left revision 02/02/2013, replacement bilateral 11/21/2015   ESOPHAGOGASTRODUODENOSCOPY (EGD) WITH PROPOFOL  N/A 06/01/2022   Procedure: ESOPHAGOGASTRODUODENOSCOPY (EGD) WITH PROPOFOL ;  Surgeon: Baldo Bonds, MD;  Location: [facility];  Service: Gastroenterology;  Laterality: N/A;   MINOR PLACEMENT OF FIDUCIAL N/A 01/09/2019   Procedure: Fiducial placement;  Surgeon: Manya Sells, MD;  Location: [facility] OR;  Service: Neurosurgery;  Laterality: N/A;  Fiducial placement   PARTIAL NEPHRECTOMY Right 2005 ish   PULSE GENERATOR IMPLANT Right 01/23/2019   Procedure: Right chest implantable pulse generator change;  Surgeon: Manya Sells, MD;  Location: [facility] OR;  Service: Neurosurgery;  Laterality: Right;   SPINAL FUSION     lumbar Fusion x 2   SUBTHALAMIC STIMULATOR BATTERY REPLACEMENT Left 02/02/2018   Procedure: Change implantable pulse generator battery, Left chest;  Surgeon: Manya Sells, MD;  Location: [facility] OR;  Service: Neurosurgery;  Laterality: Left;   SUBTHALAMIC STIMULATOR BATTERY REPLACEMENT N/A 01/22/2020   Procedure: REMOVAL OF LEFT BRAIN DEEP BRAIN STIMULATOR ELECTRODE, EXTENSION, AND PULSE GENERATOR;  Surgeon: Manya Sells, MD;  Location: [facility] OR;  Service: Neurosurgery;  Laterality: N/A;   SUBTHALAMIC STIMULATOR BATTERY REPLACEMENT Right 02/23/2022   Procedure: CHANGE IPG BATTERY, RT CHEST;  Surgeon: Dawley, Colby Daub, DO;  Location: [facility] OR;  Service: Neurosurgery;  Laterality: Right;   SUBTHALAMIC STIMULATOR INSERTION Left 01/16/2019   Procedure: Left deep brain stimulator electrode revision.;  Surgeon: Manya Sells, MD;  Location: [facility] OR;  Service: Neurosurgery;  Laterality: Left;   SUBTHALAMIC STIMULATOR INSERTION Left 01/23/2019   Procedure: Repositioning of Left Deep Brain Stimulator Electrode;  Surgeon: Manya Sells, MD;  Location: [facility] OR;  Service: Neurosurgery;  Laterality: Left;   TONSILLECTOMY     Patient Active Problem List   Diagnosis Date Noted   Melena 06/01/2022   Acute cystitis without hematuria 05/31/2022   Upper GI bleed 05/31/2022   Generalized weakness 05/30/2022   Abdominal pain 05/30/2022   OSA (obstructive  sleep apnea) 05/30/2022   Hypothyroidism 05/30/2022   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) 12/14/2021   Mixed hyperlipidemia 12/14/2021   Essential hypertension 12/14/2021   Palpitations 12/14/2021   Angina pectoris (HCC) 12/14/2021   Urge incontinence 01/15/2021   History of UTI 01/15/2021   Abnormal urine findings 01/15/2021   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   History of nephrolithiasis 01/15/2021   DVT, lower extremity, proximal, acute, right (HCC)  02/26/2020   Open scalp wound 01/22/2020   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HCC)    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   Parkinson's disease (HCC) 01/16/2018   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) 01/16/2018   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    ONSET DATE: exacerbation 06/21/23, Parkinson's diagnosis 2010  REFERRING DIAG: parkinsons disease with fluctuating dyskinesia  THERAPY DIAG:  Parkinson's disease with dyskinesia, with fluctuations (HCC)  Difficulty in walking, not elsewhere classified  Other lack of coordination  Rationale for Evaluation and Treatment: Rehabilitation  SUBJECTIVE:                                                                                                                                                                                             SUBJECTIVE STATEMENT: Pt and his wife report long history of parkinson's,  biggest difficulty with his mobility that they note is R lower back  pain and labored supine to sit movement, also poor foot clearance.  He doesn't like relying on the walker Pt accompanied by: significant other  PERTINENT HISTORY: parkinson's diagnosed greater than 10 yrs ago, deep brain stimulator R, had in L as well but had to be removed for various reasons.  walks around neighborhood 3 x week with aide Tuesdays and Thursdays parkisons bicycle class 2 x Lynelle Sara training for upper body work out Referred to PT and ST by neurologist   PAIN:  Are you having pain? R lower back pain constant  PRECAUTIONS: Fall and Other: deep brain stimulator on R   RED FLAGS: None   WEIGHT BEARING RESTRICTIONS: No  FALLS: Has patient fallen in last 6 months? No  LIVING ENVIRONMENT: Lives with: lives with their spouse Lives in: House/apartment Stairs: has internal steps, but has had home renovated and adapted so the he and his wife live in downstairs apartment, all one level with bathroom and kitchen Has following equipment at home: Single point cane,  Environmental consultant - 4 wheeled, Tour manager, Grab bars, and Ramped entry  PLOF: Independent  PATIENT GOALS: improve bed mobility and improve gait, stability, avoid falls  OBJECTIVE:  Note: Objective measures were completed at Evaluation unless otherwise noted.  DIAGNOSTIC FINDINGS: see neuro notes, has deep  brain stimulator R   COGNITION: Overall cognitive status: Within functional limits for tasks assessed   SENSATION: WFL  COORDINATION: Slowed movements B and noted "curling" R hand and R foot at times, with gait festinating on turns   EDEMA:  None noted  MUSCLE TONE: noted curling R fingers, toes, knee flex and hip flex at times, randomly during evaluation  MUSCLE LENGTH: Hamstrings: Right -30 deg; Left -30 deg Thomas test: Right nt deg; Left nt deg    POSTURE: Standing with some forward lean, weight on forefeet, L calf atrophied pt and wife report chronic after L lumbar surgery.     LOWER EXTREMITY ROM:   grossly wfl, hips not assessed as not positioned on mat today   LOWER EXTREMITY MMT:    MMT Right Eval Left Eval  Hip flexion 4 4-  Hip extension    Hip abduction    Hip adduction    Hip internal rotation    Hip external rotation    Knee flexion 4 4  Knee extension 4 4-  Ankle dorsiflexion 4 3+  Ankle plantarflexion 4- 3-  Ankle inversion    Ankle eversion    (Blank rows = not tested)  BED MOBILITY:  Not tested  TRANSFERS: I but slowed, uses hands on seat or with rollator  GAIT: Findings: Gait Characteristics: step to pattern, decreased step length- Right, decreased step length- Left, decreased stride length, decreased hip/knee flexion- Right, decreased hip/knee flexion- Left, shuffling, festinating, trunk flexed, and narrow BOS, Distance walked: 100', Assistive device utilized:Walker - 4 wheeled, Level of assistance: SBA, and Comments: festinating noted on turns, tends to turn L  FUNCTIONAL TESTS:  30 seconds chair stand test 6 reps Timed up and go (TUG):  33.39 with rollator Standing static feet together 15 sec Standing semi tandem 10 sec each leg, needed CGA with L foot forward Standing marching, required B hand hold support to maintain balance                                                                                                                              TREATMENT DATE: 07/06/23 Evaluation, advised to attempt prone lying on bed 10 to 15 min  to stretch body into extension( pt doesn't ever lie prone)   PATIENT EDUCATION: Education details: POC, goals Person educated: Patient and Spouse Education method: Explanation, Demonstration, Tactile cues, and Verbal cues Education comprehension: verbalized understanding, verbal cues required, and needs further education  HOME EXERCISE PROGRAM: TBD  GOALS: Goals reviewed with patient? Yes  SHORT TERM GOALS: Target date: 4 weeks, 08/03/23  I home program for posture and for hamstring, hip flexor stretchig Baseline:TBD Goal status: INITIAL  LONG TERM GOALS: Target date: 09/28/23: 12 weeks   30 sec sit to stand improve from 6 reps to 12 reps for improved coordination, strength, endurance LEs Baseline:  Goal status: INITIAL  2.  I and pain free supine to sit motion , rolling to R  Baseline: very painful per pt and wife history, not specifically assessed in clinic today Goal status: INITIAL  3.  TUG score improve from 33 sec to 18 or less for reduced fall risk   Baseline:  Goal status: INITIAL  4.  Improve strength LE's for B quads, hamstrings to 4+5 for improved functional strength Baseline: 4- Goal status: INITIAL   ASSESSMENT:  CLINICAL IMPRESSION: Patient is a 78 y.o. male who was evaluated today by physical therapy due to referral from his neurologist, with decline in function, particularly control of R extremities, with his Parkinson's diagnosis.  He and his wife have been proactive about maintaining his function, he attends a training/ strengthening session locally 2 x  week , attends parkinson's cycling weekly, and walks around 0.6 miles with his aide 3 x week outdoors with his rollator.  They recently have adapted their home with a main floor renovation so that their bedroom, bath, kitchen are all on one level.   Main problems identified today are weakness B LE's, L ankle is weak chronically due to lumbar radiculopathy and B knee, hips musculature is weak.  He also has coordination deficits with festinating gait particularly on turns and static / dynamic balance deficits.  Has chronic R sided lower back pain which is particularly intense with moving supine to sit.  He should benefit from physical therapy intervention to address his deficits and update his current routine for stretching and strengthening at home.   OBJECTIVE IMPAIRMENTS: Abnormal gait, decreased activity tolerance, decreased balance, decreased coordination, decreased endurance, decreased mobility, difficulty walking, decreased strength, hypomobility, impaired perceived functional ability, impaired UE functional use, postural dysfunction, and pain.   ACTIVITY LIMITATIONS: carrying, lifting, squatting, stairs, transfers, and locomotion level  PARTICIPATION LIMITATIONS: meal prep, cleaning, laundry, shopping, community activity, and church  PERSONAL FACTORS: Age, Behavior pattern, Fitness, Past/current experiences, Time since onset of injury/illness/exacerbation, and 1-2 comorbidities: h/o L lumbar radiculopathy and weakness L LE, chronic R sided lower back pain,  are also affecting patient's functional outcome.   REHAB POTENTIAL: Good  CLINICAL DECISION MAKING: Evolving/moderate complexity  EVALUATION COMPLEXITY: Moderate  PLAN:  PT FREQUENCY: 2x/week  PT DURATION: 12 weeks  PLANNED INTERVENTIONS: 97110-Therapeutic exercises, 97530- Therapeutic activity, 97112- Neuromuscular re-education, 97535- Self Care, and 60454- Manual therapy  PLAN FOR NEXT SESSION: would recommend use treatment table  to position in prone for stretching, also instruct in lower back stretches to utilize prior to moving supine to sit, bulk strengthening LE's   Lennell Shanks L Adeeb Konecny, PT, DPT, OCS 07/06/2023, 12:31 PM

## 2023-07-08 ENCOUNTER — Ambulatory Visit: Admitting: Speech Pathology

## 2023-07-08 ENCOUNTER — Encounter: Payer: Self-pay | Admitting: Speech Pathology

## 2023-07-08 DIAGNOSIS — G20B2 Parkinson's disease with dyskinesia, with fluctuations: Secondary | ICD-10-CM | POA: Diagnosis not present

## 2023-07-08 DIAGNOSIS — R471 Dysarthria and anarthria: Secondary | ICD-10-CM

## 2023-07-08 NOTE — Therapy (Signed)
 OUTPATIENT SPEECH LANGUAGE PATHOLOGY TREATMENT   Patient Name: Patrick Ross MRN: [id number] DOB:[birth year], 78 y.o., male Today's Date: 07/08/2023  PCP: Jayne Mews, MD REFERRING PROVIDER: Shirline Dover, DO  END OF SESSION:  End of Session - 07/08/23 0850     Visit Number 2    Number of Visits 17    Date for SLP Re-Evaluation 09/06/23    SLP Start Time 0845    SLP Stop Time  0925    SLP Time Calculation (min) 40 min    Activity Tolerance Patient tolerated treatment well             Past Medical History:  Diagnosis Date   Depression    DVT (deep venous thrombosis) (HCC)    GERD (gastroesophageal reflux disease)    History of kidney stones    passed   Hypertension    Hypothyroidism    Lumbar stenosis    OSA (obstructive sleep apnea)    wears cpap   Parkinson's disease (HCC)    PONV (postoperative nausea and vomiting)    REM behavioral disorder    Urinary incontinence    Past Surgical History:  Procedure Laterality Date   BACK SURGERY     x 2 - not fusion   BIOPSY  06/01/2022   Procedure: BIOPSY;  Surgeon: Baldo Bonds, MD;  Location: [facility];  Service: Gastroenterology;;   COLONOSCOPY     DEEP BRAIN STIMULATOR PLACEMENT     left 12/27/2012, right 12/10/2011, left revision 02/02/2013, replacement bilateral 11/21/2015   ESOPHAGOGASTRODUODENOSCOPY (EGD) WITH PROPOFOL  N/A 06/01/2022   Procedure: ESOPHAGOGASTRODUODENOSCOPY (EGD) WITH PROPOFOL ;  Surgeon: Baldo Bonds, MD;  Location: [facility];  Service: Gastroenterology;  Laterality: N/A;   MINOR PLACEMENT OF FIDUCIAL N/A 01/09/2019   Procedure: Fiducial placement;  Surgeon: Manya Sells, MD;  Location: [facility] OR;  Service: Neurosurgery;  Laterality: N/A;  Fiducial placement   PARTIAL NEPHRECTOMY Right 2005 ish   PULSE GENERATOR IMPLANT Right 01/23/2019   Procedure: Right chest implantable pulse generator change;  Surgeon: Manya Sells, MD;  Location: [facility] OR;  Service: Neurosurgery;  Laterality:  Right;   SPINAL FUSION     lumbar Fusion x 2   SUBTHALAMIC STIMULATOR BATTERY REPLACEMENT Left 02/02/2018   Procedure: Change implantable pulse generator battery, Left chest;  Surgeon: Manya Sells, MD;  Location: [facility] OR;  Service: Neurosurgery;  Laterality: Left;   SUBTHALAMIC STIMULATOR BATTERY REPLACEMENT N/A 01/22/2020   Procedure: REMOVAL OF LEFT BRAIN DEEP BRAIN STIMULATOR ELECTRODE, EXTENSION, AND PULSE GENERATOR;  Surgeon: Manya Sells, MD;  Location: [facility] OR;  Service: Neurosurgery;  Laterality: N/A;   SUBTHALAMIC STIMULATOR BATTERY REPLACEMENT Right 02/23/2022   Procedure: CHANGE IPG BATTERY, RT CHEST;  Surgeon: Dawley, Colby Daub, DO;  Location: [facility] OR;  Service: Neurosurgery;  Laterality: Right;   SUBTHALAMIC STIMULATOR INSERTION Left 01/16/2019   Procedure: Left deep brain stimulator electrode revision.;  Surgeon: Manya Sells, MD;  Location: [facility] OR;  Service: Neurosurgery;  Laterality: Left;   SUBTHALAMIC STIMULATOR INSERTION Left 01/23/2019   Procedure: Repositioning of Left Deep Brain Stimulator Electrode;  Surgeon: Manya Sells, MD;  Location: [facility] OR;  Service: Neurosurgery;  Laterality: Left;   TONSILLECTOMY     Patient Active Problem List   Diagnosis Date Noted   Melena 06/01/2022   Acute cystitis without hematuria 05/31/2022   Upper GI bleed 05/31/2022   Generalized weakness 05/30/2022   Abdominal pain 05/30/2022   OSA (obstructive sleep apnea) 05/30/2022   Hypothyroidism 05/30/2022   Orthostatic hypotension due to  Parkinson's disease (HCC) 12/14/2021   Mixed hyperlipidemia 12/14/2021   Essential hypertension 12/14/2021   Palpitations 12/14/2021   Angina pectoris (HCC) 12/14/2021   Urge incontinence 01/15/2021   History of UTI 01/15/2021   Abnormal urine findings 01/15/2021   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   History of nephrolithiasis 01/15/2021   DVT, lower extremity, proximal, acute, right (HCC) 02/26/2020   Open scalp wound 01/22/2020   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HCC)     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   Parkinson's disease (HCC) 01/16/2018   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   Pt accompanied by: significant other; [name]  PERTINENT HISTORY: PD (hx of STN DBS), depression, sleep apnea  PAIN:  Are you having pain? Yes: NPRS scale: 2 Pain location: lower back Pain description: chronic Aggravating factors: sitting up Relieving factors: N/A  FALLS: Has patient fallen in last 6 months?  Yes  LIVING ENVIRONMENT: Lives with: lives with their family; 1 Lives in: House/apartment  PLOF:  Level of assistance: Independent with ADLs, Independent with IADLs Employment: Retired  PATIENT GOALS: voice, cognition  OBJECTIVE:  Note: Objective measures were completed at Evaluation unless otherwise noted.  DIAGNOSTIC FINDINGS: No imaging on this EMR since 2021 CT Head.   COGNITION: Overall cognitive status: Impaired Areas of impairment:  Attention: Impaired: Selective, Alternating, Divided Memory: Impaired: Short term Prospective Executive function: Impaired: Organization and Planning Functional deficits: Difficulty with recall of details  MOTOR SPEECH: Overall motor speech: impaired Level of impairment: Conversation Respiration: Reduced support Phonation: normal Resonance: WFL Articulation: Impaired: conversation Intelligibility: Intelligibility reduced Motor planning: Appears intact Motor speech errors: NA Interfering components: NA Effective technique: slow rate, increased vocal intensity, and over articulate  ORAL MOTOR  EXAMINATION: Overall status: WFL Comments:    CLINICAL SWALLOW ASSESSMENT:   Current diet: regular and thin liquids Dentition: adequate natural dentition Patient directly observed with POs: Yes: thin liquids  Feeding: able to feed self Liquids provided by: cup and straw Oral phase signs and symptoms: NA Pharyngeal phase signs and symptoms: multiple swallows Comments: [facility] Swallow Protocol PASS   *Pt reports some difficulty with drier foods and occasionally getting strangled with liquids. No observed this date. To monitor.   SOCIAL HISTORY: Occupation: Warden/ranger intake: suboptimal Caffeine/alcohol intake: minimal Daily voice use: minimal  PERCEPTUAL VOICE ASSESSMENT: Voice quality: normal Vocal abuse: NA Resonance: normal Respiratory function: thoracic breathing  LSVT-LOUD VOICE EVALUATION: Maximum phonation time for sustained "ah": 13 sec Mean intensity during sustained "ah": 80 dB  Mean fundamental frequency during sustained "ah": 111 Hz  Mean intensity sustained during conversational speech: 70 dB Habitual pitch: 110 Hz Highest dynamic pitch when altering pitch from a low note to a high note: 175 Hz Lowest dynamic pitch when altering from a high note to a low note: 106 Hz Highest dynamic pitch during conversational speech: 159 Hz Lowest dynamic pitch during conversational speech: 82 Hz Average time patient was able to sustain /s/: 4.8 seconds Average time patient was able to sustain /z/: 13.8 seconds s/z ratio : .34 (indicating suspected respiratory inefficiency)   Speech is characterized by Other: . For trained listener in quiet environment with context, intelligibility was >90%.  Patient able to improve all parameters with model ("Loud like me") Stimulability: Improved vocal quality with loud voice (81 dB) for sustained vowel, functional phrases averaged 78 dB and improved pitch range with model.     The Communication Effectiveness Survey is a patient-reported  outcome measure in which the patient rates their own effectiveness in different communication situations. A higher score indicates greater effectiveness. Pt's self-rating was 27/32. Patient reported mild difficulty conversing with a telephone, conversing in a noisy environment, speaking when emotional, conversing when traveling in a car, having conversation with someone at a distance.   STANDARDIZED ASSESSMENTS: SLUMS: 24/30  PATIENT REPORTED OUTCOME MEASURES (PROM): Cognitive function: Short Form: 34 and Communication Effectiveness Survey: 27                                                                                                                            TREATMENT DATE:   07/08/23: Pt was seen for skilled ST services targeting continued voice assessment - see above. Pt also completed PROMs for cognitive function and dysarthria. Pt brought in his "Speak Out" book from previous therapist. SLP encouraged pt to continue with exercises. SLP is not "speak out" certified, but plans to use similar cues with exemplars from book for continuity of care.  PATIENT EDUCATION: Education details: PD and SLP role  Person educated: Patient and Spouse Education method: Explanation Education comprehension: verbalized understanding   GOALS: Goals reviewed with patient? Yes  SHORT TERM GOALS: Target date: 08/07/23  Pt will complete baseline voice measures Baseline: Goal status:MET  2.  Pt will complete PROMs Baseline:  Goal status: MET  3.  Pt will demonstrate voice HEP independently Baseline:  Goal status: INITIAL  4.  Pt/wife will verbalize 3 memory strategies to improve recall of important information Baseline:  Goal status: INITIAL  5.   Baseline:  Goal status: INITIAL  6.   Baseline:  Goal status: INITIAL  LONG TERM GOALS: Target date: 09/06/23  Pt will improve score on PROMs Baseline:  Goal status: INITIAL  2.  Pt will demonstrate improved vocal intensity with sustained  phonation Baseline:  Goal status: INITIAL  3.  Pt will demonstrate improved vocal intensity in 5 min, unstructured conversation Baseline:  Goal status: INITIAL  4.  Pt/wife will report success using memory strategies at home Baseline:  Goal status: INITIAL  5.   Baseline:  Goal status: INITIAL  6.   Baseline:  Goal status: INITIAL  ASSESSMENT:  CLINICAL IMPRESSION: Pt is a 78 yo male who presents to ST OP for evaluation for sx 2/2 Parkinson's Disease.  Pt endorses changes in vocal intensity, breath support, and cognition re: focus, memory. Pt has received speech therapy before at [facility] ~2 years ago. Wife reports he has been referred for a neuropsych evaluation for cognition; therefore, SLP to just complete a screen at this time. Pt was assessed using SLUMS - scoring a 24/30. Impairments noted in recall and word generation. Pt  with complaints of occasional difficulty with thin liquids and solids. Pt passed YSP, with no overt s/sx of aspiration. Pt reports he has some difficulty with drier solids getting "stuck". SLP suggested moister meats re: chicken thighs vs chicken breast, cutting items into smaller pieces, and adding sauces/gravies to drier foods. Unable to complete voice measures due to time constraints. To complete next session. SLP rec skilled ST services to address cognitive-communication impairment, dysarthria, and monitor dysphagia.    OBJECTIVE IMPAIRMENTS: include attention, memory, executive functioning, and dysarthria. These impairments are limiting patient from effectively communicating at home and in community. Factors affecting potential to achieve goals and functional outcome are NA. Patient will benefit from skilled SLP services to address above impairments and improve overall function.  REHAB POTENTIAL: Good  PLAN:  SLP FREQUENCY: 1-2x/week  SLP DURATION: 8 weeks  PLANNED INTERVENTIONS: Diet toleration management , Cueing hierachy,  Internal/external aids, Functional tasks, Multimodal communication approach, SLP instruction and feedback, Compensatory strategies, Patient/family education, and [id number] Treatment of speech (30 or 45 min)     [employer], CCC-SLP 07/08/2023, 8:50 AM

## 2023-07-12 ENCOUNTER — Ambulatory Visit: Admitting: Speech Pathology

## 2023-07-12 ENCOUNTER — Encounter: Admitting: Physical Medicine & Rehabilitation

## 2023-07-12 ENCOUNTER — Encounter: Payer: Self-pay | Admitting: Speech Pathology

## 2023-07-12 DIAGNOSIS — R41841 Cognitive communication deficit: Secondary | ICD-10-CM

## 2023-07-12 DIAGNOSIS — R471 Dysarthria and anarthria: Secondary | ICD-10-CM

## 2023-07-12 DIAGNOSIS — G20B2 Parkinson's disease with dyskinesia, with fluctuations: Secondary | ICD-10-CM | POA: Diagnosis not present

## 2023-07-12 NOTE — Telephone Encounter (Signed)
 Called and spoke to patients wife and she said the letter is for her and not Ethelle Herb. She is wanting a letter to excuse her from [employer] since she is the caretaker of patient. Patients wife is 32.

## 2023-07-12 NOTE — Therapy (Signed)
 OUTPATIENT SPEECH LANGUAGE PATHOLOGY TREATMENT   Patient Name: Patrick Ross MRN: [id number] DOB:[birth year], 78 y.o., male Today's Date: 07/12/2023  PCP: Jayne Mews, MD REFERRING PROVIDER: Shirline Dover, DO  END OF SESSION:  End of Session - 07/12/23 0853     Visit Number 3    Number of Visits 17    Date for SLP Re-Evaluation 09/06/23    SLP Start Time 0850    SLP Stop Time  0928    SLP Time Calculation (min) 38 min    Activity Tolerance Patient tolerated treatment well             Past Medical History:  Diagnosis Date   Depression    DVT (deep venous thrombosis) (HCC)    GERD (gastroesophageal reflux disease)    History of kidney stones    passed   Hypertension    Hypothyroidism    Lumbar stenosis    OSA (obstructive sleep apnea)    wears cpap   Parkinson's disease (HCC)    PONV (postoperative nausea and vomiting)    REM behavioral disorder    Urinary incontinence    Past Surgical History:  Procedure Laterality Date   BACK SURGERY     x 2 - not fusion   BIOPSY  06/01/2022   Procedure: BIOPSY;  Surgeon: Baldo Bonds, MD;  Location: [facility];  Service: Gastroenterology;;   COLONOSCOPY     DEEP BRAIN STIMULATOR PLACEMENT     left 12/27/2012, right 12/10/2011, left revision 02/02/2013, replacement bilateral 11/21/2015   ESOPHAGOGASTRODUODENOSCOPY (EGD) WITH PROPOFOL  N/A 06/01/2022   Procedure: ESOPHAGOGASTRODUODENOSCOPY (EGD) WITH PROPOFOL ;  Surgeon: Baldo Bonds, MD;  Location: [facility];  Service: Gastroenterology;  Laterality: N/A;   MINOR PLACEMENT OF FIDUCIAL N/A 01/09/2019   Procedure: Fiducial placement;  Surgeon: Manya Sells, MD;  Location: [facility] OR;  Service: Neurosurgery;  Laterality: N/A;  Fiducial placement   PARTIAL NEPHRECTOMY Right 2005 ish   PULSE GENERATOR IMPLANT Right 01/23/2019   Procedure: Right chest implantable pulse generator change;  Surgeon: Manya Sells, MD;  Location: [facility] OR;  Service: Neurosurgery;  Laterality:  Right;   SPINAL FUSION     lumbar Fusion x 2   SUBTHALAMIC STIMULATOR BATTERY REPLACEMENT Left 02/02/2018   Procedure: Change implantable pulse generator battery, Left chest;  Surgeon: Manya Sells, MD;  Location: [facility] OR;  Service: Neurosurgery;  Laterality: Left;   SUBTHALAMIC STIMULATOR BATTERY REPLACEMENT N/A 01/22/2020   Procedure: REMOVAL OF LEFT BRAIN DEEP BRAIN STIMULATOR ELECTRODE, EXTENSION, AND PULSE GENERATOR;  Surgeon: Manya Sells, MD;  Location: [facility] OR;  Service: Neurosurgery;  Laterality: N/A;   SUBTHALAMIC STIMULATOR BATTERY REPLACEMENT Right 02/23/2022   Procedure: CHANGE IPG BATTERY, RT CHEST;  Surgeon: Dawley, Colby Daub, DO;  Location: [facility] OR;  Service: Neurosurgery;  Laterality: Right;   SUBTHALAMIC STIMULATOR INSERTION Left 01/16/2019   Procedure: Left deep brain stimulator electrode revision.;  Surgeon: Manya Sells, MD;  Location: [facility] OR;  Service: Neurosurgery;  Laterality: Left;   SUBTHALAMIC STIMULATOR INSERTION Left 01/23/2019   Procedure: Repositioning of Left Deep Brain Stimulator Electrode;  Surgeon: Manya Sells, MD;  Location: [facility] OR;  Service: Neurosurgery;  Laterality: Left;   TONSILLECTOMY     Patient Active Problem List   Diagnosis Date Noted   Melena 06/01/2022   Acute cystitis without hematuria 05/31/2022   Upper GI bleed 05/31/2022   Generalized weakness 05/30/2022   Abdominal pain 05/30/2022   OSA (obstructive sleep apnea) 05/30/2022   Hypothyroidism 05/30/2022   Orthostatic hypotension due to  Parkinson's disease (HCC) 12/14/2021   Mixed hyperlipidemia 12/14/2021   Essential hypertension 12/14/2021   Palpitations 12/14/2021   Angina pectoris (HCC) 12/14/2021   Urge incontinence 01/15/2021   History of UTI 01/15/2021   Abnormal urine findings 01/15/2021   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   History of nephrolithiasis 01/15/2021   DVT, lower extremity, proximal, acute, right (HCC) 02/26/2020   Open scalp wound 01/22/2020   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HCC)     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   Parkinson's disease (HCC) 01/16/2018   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   Pt accompanied by: significant other; [name]  PERTINENT HISTORY: PD (hx of STN DBS), depression, sleep apnea  PAIN:  Are you having pain? Yes: NPRS scale: 2 Pain location: lower back Pain description: chronic Aggravating factors: sitting up Relieving factors: N/A  FALLS: Has patient fallen in last 6 months?  Yes  LIVING ENVIRONMENT: Lives with: lives with their family; 1 Lives in: House/apartment  PLOF:  Level of assistance: Independent with ADLs, Independent with IADLs Employment: Retired  PATIENT GOALS: voice, cognition  OBJECTIVE:  Note: Objective measures were completed at Evaluation unless otherwise noted.  DIAGNOSTIC FINDINGS: No imaging on this EMR since 2021 CT Head.   COGNITION: Overall cognitive status: Impaired Areas of impairment:  Attention: Impaired: Selective, Alternating, Divided Memory: Impaired: Short term Prospective Executive function: Impaired: Organization and Planning Functional deficits: Difficulty with recall of details  MOTOR SPEECH: Overall motor speech: impaired Level of impairment: Conversation Respiration: Reduced support Phonation: normal Resonance: WFL Articulation: Impaired: conversation Intelligibility: Intelligibility reduced Motor planning: Appears intact Motor speech errors: NA Interfering components: NA Effective technique: slow rate, increased vocal intensity, and over articulate  ORAL MOTOR  EXAMINATION: Overall status: WFL Comments:    CLINICAL SWALLOW ASSESSMENT:   Current diet: regular and thin liquids Dentition: adequate natural dentition Patient directly observed with POs: Yes: thin liquids  Feeding: able to feed self Liquids provided by: cup and straw Oral phase signs and symptoms: NA Pharyngeal phase signs and symptoms: multiple swallows Comments: [facility] Swallow Protocol PASS   *Pt reports some difficulty with drier foods and occasionally getting strangled with liquids. No observed this date. To monitor.   SOCIAL HISTORY: Occupation: Warden/ranger intake: suboptimal Caffeine/alcohol intake: minimal Daily voice use: minimal  PERCEPTUAL VOICE ASSESSMENT: Voice quality: normal Vocal abuse: NA Resonance: normal Respiratory function: thoracic breathing  LSVT-LOUD VOICE EVALUATION: Maximum phonation time for sustained "ah": 13 sec Mean intensity during sustained "ah": 80 dB  Mean fundamental frequency during sustained "ah": 111 Hz  Mean intensity sustained during conversational speech: 70 dB Habitual pitch: 110 Hz Highest dynamic pitch when altering pitch from a low note to a high note: 175 Hz Lowest dynamic pitch when altering from a high note to a low note: 106 Hz Highest dynamic pitch during conversational speech: 159 Hz Lowest dynamic pitch during conversational speech: 82 Hz Average time patient was able to sustain /s/: 4.8 seconds Average time patient was able to sustain /z/: 13.8 seconds s/z ratio : .34 (indicating suspected respiratory inefficiency)   Speech is characterized by Other: . For trained listener in quiet environment with context, intelligibility was >90%.  Patient able to improve all parameters with model ("Loud like me") Stimulability: Improved vocal quality with loud voice (81 dB) for sustained vowel, functional phrases averaged 78 dB and improved pitch range with model.     The Communication Effectiveness Survey is a patient-reported  outcome measure in which the patient rates their own effectiveness in different communication situations. A higher score indicates greater effectiveness. Pt's self-rating was 27/32. Patient reported mild difficulty conversing with a telephone, conversing in a noisy environment, speaking when emotional, conversing when traveling in a car, having conversation with someone at a distance.   STANDARDIZED ASSESSMENTS: SLUMS: 24/30  PATIENT REPORTED OUTCOME MEASURES (PROM): Cognitive function: Short Form: 34 and Communication Effectiveness Survey: 27                                                                                                                            TREATMENT DATE:   07/12/23: Pt was seen for skilled ST services targeting review of SPEAK OUT. SLP educated on "Living with Intent Means To..." found in his HEP book. Patient reported that "speaking with conscious effort" resonated the most with him and his idea of intent. SLP led patient through vocal warm up re: May-Me-My-Moe-Mo x5, sustained phonation /ah/ x5, pitch glides x5/each. Pt completed cognitive component of SPEAK OUT book independently (up and ___). Pt demonstrated completion of SPEAK OUT  exercises with spv. SLP set pt up with a system to separate his completion from previous therapy course (using pencil and X marks vs. Pen and Check Mark). Pt completed Lesson 1 with therapist for AM portion - to complete PM portion at home this evening.   07/08/23: Pt was seen for skilled ST services targeting continued voice assessment - see above. Pt also completed PROMs for cognitive function and dysarthria. Pt brought in his "Speak Out" book from previous therapist. SLP encouraged pt to continue with exercises. SLP is not "speak out" certified, but plans to use similar cues with exemplars from book for continuity of care.  PATIENT EDUCATION: Education details: PD and SLP role  Person educated: Patient and Spouse Education method:  Explanation Education comprehension: verbalized understanding   GOALS: Goals reviewed with patient? Yes  SHORT TERM GOALS: Target date: 08/07/23  Pt will complete baseline voice measures Baseline: Goal status:MET  2.  Pt will complete PROMs Baseline:  Goal status: MET  3.  Pt will demonstrate voice HEP independently Baseline:  Goal status: INITIAL  4.  Pt/wife will verbalize 3 memory strategies to improve recall of important information Baseline:  Goal status: INITIAL  5.   Baseline:  Goal status: INITIAL  6.   Baseline:  Goal status: INITIAL  LONG TERM GOALS: Target date: 09/06/23  Pt will improve score on PROMs Baseline:  Goal status: INITIAL  2.  Pt will demonstrate improved vocal intensity with sustained phonation Baseline:  Goal status: INITIAL  3.  Pt will demonstrate improved vocal intensity in 5 min, unstructured conversation Baseline:  Goal status: INITIAL  4.  Pt/wife will report success using memory strategies at home Baseline:  Goal status: INITIAL  5.   Baseline:  Goal status: INITIAL  6.   Baseline:  Goal status: INITIAL  ASSESSMENT:  CLINICAL IMPRESSION: Pt is a 79 yo male who presents to ST OP for evaluation for sx 2/2 Parkinson's Disease.  Pt endorses changes in vocal intensity, breath support, and cognition re: focus, memory. Pt has received speech therapy before at [facility] ~2 years ago. Wife reports he has been referred for a neuropsych evaluation for cognition; therefore, SLP to just complete a screen at this time. Pt was assessed using SLUMS - scoring a 24/30. Impairments noted in recall and word generation. Pt with complaints of occasional difficulty with thin liquids and solids. Pt passed YSP, with no overt s/sx of aspiration. Pt reports he has some difficulty with drier solids getting "stuck". SLP suggested moister meats re: chicken thighs vs chicken breast, cutting items into smaller pieces, and adding sauces/gravies to  drier foods. Unable to complete voice measures due to time constraints. To complete next session. SLP rec skilled ST services to address cognitive-communication impairment, dysarthria, and monitor dysphagia.    OBJECTIVE IMPAIRMENTS: include attention, memory, executive functioning, and dysarthria. These impairments are limiting patient from effectively communicating at home and in community. Factors affecting potential to achieve goals and functional outcome are NA. Patient will benefit from skilled SLP services to address above impairments and improve overall function.  REHAB POTENTIAL: Good  PLAN:  SLP FREQUENCY: 1-2x/week  SLP DURATION: 8 weeks  PLANNED INTERVENTIONS: Diet toleration management , Cueing hierachy, Internal/external aids, Functional tasks, Multimodal communication approach, SLP instruction and feedback, Compensatory strategies, Patient/family education, and [id number] Treatment of speech (30 or 45 min)     [employer], CCC-SLP 07/12/2023, 8:54 AM

## 2023-07-14 NOTE — Telephone Encounter (Signed)
 Called patients wife and informed her per Dr. Winferd Hatter Since she isn't her patient, it will need to come from her physician.  Dr. Winferd Hatter can't get into her chart to even write a letter.  Her physician should be able to assist with this.  Patients wife verbalized understanding and had no further questions or concerns.

## 2023-07-21 ENCOUNTER — Ambulatory Visit: Admitting: Speech Pathology

## 2023-07-21 ENCOUNTER — Encounter: Payer: Self-pay | Admitting: Speech Pathology

## 2023-07-21 DIAGNOSIS — R471 Dysarthria and anarthria: Secondary | ICD-10-CM

## 2023-07-21 DIAGNOSIS — R41841 Cognitive communication deficit: Secondary | ICD-10-CM

## 2023-07-21 DIAGNOSIS — G20B2 Parkinson's disease with dyskinesia, with fluctuations: Secondary | ICD-10-CM | POA: Diagnosis not present

## 2023-07-21 NOTE — Therapy (Signed)
 OUTPATIENT SPEECH LANGUAGE PATHOLOGY TREATMENT   Patient Name: Patrick Ross MRN: [id number] DOB:1945/06/27, 78 y.o., male Today's Date: 07/21/2023  PCP: Jayne Mews, MD REFERRING PROVIDER: Shirline Dover, DO  END OF SESSION:  End of Session - 07/21/23 1234     Visit Number 4    Number of Visits 17    Date for SLP Re-Evaluation 09/06/23    SLP Start Time 1230    SLP Stop Time  1310    SLP Time Calculation (min) 40 min    Activity Tolerance Patient tolerated treatment well             Past Medical History:  Diagnosis Date   Depression    DVT (deep venous thrombosis) (HCC)    GERD (gastroesophageal reflux disease)    History of kidney stones    passed   Hypertension    Hypothyroidism    Lumbar stenosis    OSA (obstructive sleep apnea)    wears cpap   Parkinson's disease (HCC)    PONV (postoperative nausea and vomiting)    REM behavioral disorder    Urinary incontinence    Past Surgical History:  Procedure Laterality Date   BACK SURGERY     x 2 - not fusion   BIOPSY  06/01/2022   Procedure: BIOPSY;  Surgeon: Baldo Bonds, MD;  Location: [facility];  Service: Gastroenterology;;   COLONOSCOPY     DEEP BRAIN STIMULATOR PLACEMENT     left 12/27/2012, right 12/10/2011, left revision 02/02/2013, replacement bilateral 11/21/2015   ESOPHAGOGASTRODUODENOSCOPY (EGD) WITH PROPOFOL  N/A 06/01/2022   Procedure: ESOPHAGOGASTRODUODENOSCOPY (EGD) WITH PROPOFOL ;  Surgeon: Baldo Bonds, MD;  Location: [facility];  Service: Gastroenterology;  Laterality: N/A;   MINOR PLACEMENT OF FIDUCIAL N/A 01/09/2019   Procedure: Fiducial placement;  Surgeon: Manya Sells, MD;  Location: [facility] OR;  Service: Neurosurgery;  Laterality: N/A;  Fiducial placement   PARTIAL NEPHRECTOMY Right 2005 ish   PULSE GENERATOR IMPLANT Right 01/23/2019   Procedure: Right chest implantable pulse generator change;  Surgeon: Manya Sells, MD;  Location: [facility] OR;  Service: Neurosurgery;  Laterality:  Right;   SPINAL FUSION     lumbar Fusion x 2   SUBTHALAMIC STIMULATOR BATTERY REPLACEMENT Left 02/02/2018   Procedure: Change implantable pulse generator battery, Left chest;  Surgeon: Manya Sells, MD;  Location: [facility] OR;  Service: Neurosurgery;  Laterality: Left;   SUBTHALAMIC STIMULATOR BATTERY REPLACEMENT N/A 01/22/2020   Procedure: REMOVAL OF LEFT BRAIN DEEP BRAIN STIMULATOR ELECTRODE, EXTENSION, AND PULSE GENERATOR;  Surgeon: Manya Sells, MD;  Location: [facility] OR;  Service: Neurosurgery;  Laterality: N/A;   SUBTHALAMIC STIMULATOR BATTERY REPLACEMENT Right 02/23/2022   Procedure: CHANGE IPG BATTERY, RT CHEST;  Surgeon: Dawley, Colby Daub, DO;  Location: [facility] OR;  Service: Neurosurgery;  Laterality: Right;   SUBTHALAMIC STIMULATOR INSERTION Left 01/16/2019   Procedure: Left deep brain stimulator electrode revision.;  Surgeon: Manya Sells, MD;  Location: [facility] OR;  Service: Neurosurgery;  Laterality: Left;   SUBTHALAMIC STIMULATOR INSERTION Left 01/23/2019   Procedure: Repositioning of Left Deep Brain Stimulator Electrode;  Surgeon: Manya Sells, MD;  Location: [facility] OR;  Service: Neurosurgery;  Laterality: Left;   TONSILLECTOMY     Patient Active Problem List   Diagnosis Date Noted   Melena 06/01/2022   Acute cystitis without hematuria 05/31/2022   Upper GI bleed 05/31/2022   Generalized weakness 05/30/2022   Abdominal pain 05/30/2022   OSA (obstructive sleep apnea) 05/30/2022   Hypothyroidism 05/30/2022   Orthostatic hypotension due to  Parkinson's disease (HCC) 12/14/2021   Mixed hyperlipidemia 12/14/2021   Essential hypertension 12/14/2021   Palpitations 12/14/2021   Angina pectoris (HCC) 12/14/2021   Urge incontinence 01/15/2021   History of UTI 01/15/2021   Abnormal urine findings 01/15/2021   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   History of nephrolithiasis 01/15/2021   DVT, lower extremity, proximal, acute, right (HCC) 02/26/2020   Open scalp wound 01/22/2020   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HCC)     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   Parkinson's disease (HCC) 01/16/2018   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   Pt accompanied by: significant other; [name]  PERTINENT HISTORY: PD (hx of STN DBS), depression, sleep apnea  PAIN:  Are you having pain? Yes: NPRS scale: 3 Pain location: lower back to R leg Pain description: chronic Aggravating factors: sitting up Relieving factors: N/A  FALLS: Has patient fallen in last 6 months?  Yes  LIVING ENVIRONMENT: Lives with: lives with their family; 1 Lives in: House/apartment  PLOF:  Level of assistance: Independent with ADLs, Independent with IADLs Employment: Retired  PATIENT GOALS: voice, cognition  OBJECTIVE:  Note: Objective measures were completed at Evaluation unless otherwise noted.  DIAGNOSTIC FINDINGS: No imaging on this EMR since 2021 CT Head.   COGNITION: Overall cognitive status: Impaired Areas of impairment:  Attention: Impaired: Selective, Alternating, Divided Memory: Impaired: Short term Prospective Executive function: Impaired: Organization and Planning Functional deficits: Difficulty with recall of details  MOTOR SPEECH: Overall motor speech: impaired Level of impairment: Conversation Respiration: Reduced support Phonation: normal Resonance: WFL Articulation: Impaired: conversation Intelligibility: Intelligibility reduced Motor planning: Appears intact Motor speech errors: NA Interfering components: NA Effective technique: slow rate, increased vocal intensity, and over articulate  ORAL  MOTOR EXAMINATION: Overall status: WFL Comments:    CLINICAL SWALLOW ASSESSMENT:   Current diet: regular and thin liquids Dentition: adequate natural dentition Patient directly observed with POs: Yes: thin liquids  Feeding: able to feed self Liquids provided by: cup and straw Oral phase signs and symptoms: NA Pharyngeal phase signs and symptoms: multiple swallows Comments: [facility] Swallow Protocol PASS   *Pt reports some difficulty with drier foods and occasionally getting strangled with liquids. No observed this date. To monitor.   SOCIAL HISTORY: Occupation: Warden/ranger intake: suboptimal Caffeine/alcohol intake: minimal Daily voice use: minimal  PERCEPTUAL VOICE ASSESSMENT: Voice quality: normal Vocal abuse: NA Resonance: normal Respiratory function: thoracic breathing  LSVT-LOUD VOICE EVALUATION: Maximum phonation time for sustained "ah": 13 sec Mean intensity during sustained "ah": 80 dB  Mean fundamental frequency during sustained "ah": 111 Hz  Mean intensity sustained during conversational speech: 70 dB Habitual pitch: 110 Hz Highest dynamic pitch when altering pitch from a low note to a high note: 175 Hz Lowest dynamic pitch when altering from a high note to a low note: 106 Hz Highest dynamic pitch during conversational speech: 159 Hz Lowest dynamic pitch during conversational speech: 82 Hz Average time patient was able to sustain /s/: 4.8 seconds Average time patient was able to sustain /z/: 13.8 seconds s/z ratio : .34 (indicating suspected respiratory inefficiency)   Speech is characterized by Other: . For trained listener in quiet environment with context,  intelligibility was >90%.    Patient able to improve all parameters with model ("Loud like me") Stimulability: Improved vocal quality with loud voice (81 dB) for sustained vowel, functional phrases averaged 78 dB and improved pitch range with model.     The Communication Effectiveness Survey is a  patient-reported outcome measure in which the patient rates their own effectiveness in different communication situations. A higher score indicates greater effectiveness. Pt's self-rating was 27/32. Patient reported mild difficulty conversing with a telephone, conversing in a noisy environment, speaking when emotional, conversing when traveling in a car, having conversation with someone at a distance.   STANDARDIZED ASSESSMENTS: SLUMS: 24/30  PATIENT REPORTED OUTCOME MEASURES (PROM): Cognitive function: Short Form: 34 and Communication Effectiveness Survey: 27                                                                                                                            TREATMENT DATE:   07/21/23: Pt was seen for skilled ST services targeting review of SPEAK OUT. Pt reports he did not complete any voice exercises due to company this week. SLP explained the importance of consistency with exercises for demonstrated improvement. He has not completed his EMST exercises in over a year. SLP reassessed on his personal device. Pt encouraged to set a routine to complete voice and respiratory exercises.    EMST 150 3X5 reps @ 75% of MEP - 30 cmH20 with 15 second rest  (45 cmH20 MEP)   07/12/23: Pt was seen for skilled ST services targeting review of SPEAK OUT. SLP educated on "Living with Intent Means To..." found in his HEP book. Patient reported that "speaking with conscious effort" resonated the most with him and his idea of intent. SLP led patient through vocal warm up re: May-Me-My-Moe-Mo x5, sustained phonation /ah/ x5, pitch glides x5/each. Pt completed cognitive component of SPEAK OUT book independently (up and ___). Pt demonstrated completion of SPEAK OUT  exercises with spv. SLP set pt up with a system to separate his completion from previous therapy course (using pencil and X marks vs. Pen and Check Mark). Pt completed Lesson 1 with therapist for AM portion - to complete PM portion at  home this evening.   07/08/23: Pt was seen for skilled ST services targeting continued voice assessment - see above. Pt also completed PROMs for cognitive function and dysarthria. Pt brought in his "Speak Out" book from previous therapist. SLP encouraged pt to continue with exercises. SLP is not "speak out" certified, but plans to use similar cues with exemplars from book for continuity of care.  PATIENT EDUCATION: Education details: PD and SLP role  Person educated: Patient and Spouse Education method: Explanation Education comprehension: verbalized understanding   GOALS: Goals reviewed with patient? Yes  SHORT TERM GOALS: Target date: 08/07/23  Pt will complete baseline voice measures Baseline: Goal status:MET  2.  Pt will complete PROMs Baseline:  Goal status: MET  3.  Pt will demonstrate voice  HEP independently Baseline:  Goal status: INITIAL  4.  Pt/wife will verbalize 3 memory strategies to improve recall of important information Baseline:  Goal status: INITIAL  5.   Baseline:  Goal status: INITIAL  6.   Baseline:  Goal status: INITIAL  LONG TERM GOALS: Target date: 09/06/23  Pt will improve score on PROMs Baseline:  Goal status: INITIAL  2.  Pt will demonstrate improved vocal intensity with sustained phonation Baseline:  Goal status: INITIAL  3.  Pt will demonstrate improved vocal intensity in 5 min, unstructured conversation Baseline:  Goal status: INITIAL  4.  Pt/wife will report success using memory strategies at home Baseline:  Goal status: INITIAL  5.   Baseline:  Goal status: INITIAL  6.   Baseline:  Goal status: INITIAL  ASSESSMENT:  CLINICAL IMPRESSION: Pt is a 78 yo male who presents to ST OP for evaluation for sx 2/2 Parkinson's Disease.  Pt endorses changes in vocal intensity, breath support, and cognition re: focus, memory. Pt has received speech therapy before at [facility] ~2 years ago. Wife reports he has been referred for  a neuropsych evaluation for cognition; therefore, SLP to just complete a screen at this time. Pt was assessed using SLUMS - scoring a 24/30. Impairments noted in recall and word generation. Pt with complaints of occasional difficulty with thin liquids and solids. Pt passed YSP, with no overt s/sx of aspiration. Pt reports he has some difficulty with drier solids getting "stuck". SLP suggested moister meats re: chicken thighs vs chicken breast, cutting items into smaller pieces, and adding sauces/gravies to drier foods. Unable to complete voice measures due to time constraints. To complete next session. SLP rec skilled ST services to address cognitive-communication impairment, dysarthria, and monitor dysphagia.    OBJECTIVE IMPAIRMENTS: include attention, memory, executive functioning, and dysarthria. These impairments are limiting patient from effectively communicating at home and in community. Factors affecting potential to achieve goals and functional outcome are NA. Patient will benefit from skilled SLP services to address above impairments and improve overall function.  REHAB POTENTIAL: Good  PLAN:  SLP FREQUENCY: 1-2x/week  SLP DURATION: 8 weeks  PLANNED INTERVENTIONS: Diet toleration management , Cueing hierachy, Internal/external aids, Functional tasks, Multimodal communication approach, SLP instruction and feedback, Compensatory strategies, Patient/family education, and [id number] Treatment of speech (30 or 45 min)     [employer], CCC-SLP 07/21/2023, 12:34 PM

## 2023-07-26 NOTE — Therapy (Signed)
 OUTPATIENT PHYSICAL THERAPY NEURO TREATMENT   Patient Name: Patrick Ross MRN: [id number] DOB:1946/02/02, 78 y.o., male Today's Date: 07/27/2023   PCP: Jayne Mews MD REFERRING PROVIDER: Shirline Dover DO  END OF SESSION:  PT End of Session - 07/27/23 1353     Visit Number 2    Authorization Type Medicare    PT Start Time 1355    PT Stop Time 1440    PT Time Calculation (min) 45 min              Past Medical History:  Diagnosis Date   Depression    DVT (deep venous thrombosis) (HCC)    GERD (gastroesophageal reflux disease)    History of kidney stones    passed   Hypertension    Hypothyroidism    Lumbar stenosis    OSA (obstructive sleep apnea)    wears cpap   Parkinson's disease (HCC)    PONV (postoperative nausea and vomiting)    REM behavioral disorder    Urinary incontinence    Past Surgical History:  Procedure Laterality Date   BACK SURGERY     x 2 - not fusion   BIOPSY  06/01/2022   Procedure: BIOPSY;  Surgeon: Baldo Bonds, MD;  Location: [facility];  Service: Gastroenterology;;   COLONOSCOPY     DEEP BRAIN STIMULATOR PLACEMENT     left 12/27/2012, right 12/10/2011, left revision 02/02/2013, replacement bilateral 11/21/2015   ESOPHAGOGASTRODUODENOSCOPY (EGD) WITH PROPOFOL  N/A 06/01/2022   Procedure: ESOPHAGOGASTRODUODENOSCOPY (EGD) WITH PROPOFOL ;  Surgeon: Baldo Bonds, MD;  Location: [facility];  Service: Gastroenterology;  Laterality: N/A;   MINOR PLACEMENT OF FIDUCIAL N/A 01/09/2019   Procedure: Fiducial placement;  Surgeon: Manya Sells, MD;  Location: [facility] OR;  Service: Neurosurgery;  Laterality: N/A;  Fiducial placement   PARTIAL NEPHRECTOMY Right 2005 ish   PULSE GENERATOR IMPLANT Right 01/23/2019   Procedure: Right chest implantable pulse generator change;  Surgeon: Manya Sells, MD;  Location: [facility] OR;  Service: Neurosurgery;  Laterality: Right;   SPINAL FUSION     lumbar Fusion x 2   SUBTHALAMIC STIMULATOR BATTERY REPLACEMENT  Left 02/02/2018   Procedure: Change implantable pulse generator battery, Left chest;  Surgeon: Manya Sells, MD;  Location: [facility] OR;  Service: Neurosurgery;  Laterality: Left;   SUBTHALAMIC STIMULATOR BATTERY REPLACEMENT N/A 01/22/2020   Procedure: REMOVAL OF LEFT BRAIN DEEP BRAIN STIMULATOR ELECTRODE, EXTENSION, AND PULSE GENERATOR;  Surgeon: Manya Sells, MD;  Location: [facility] OR;  Service: Neurosurgery;  Laterality: N/A;   SUBTHALAMIC STIMULATOR BATTERY REPLACEMENT Right 02/23/2022   Procedure: CHANGE IPG BATTERY, RT CHEST;  Surgeon: Dawley, Colby Daub, DO;  Location: [facility] OR;  Service: Neurosurgery;  Laterality: Right;   SUBTHALAMIC STIMULATOR INSERTION Left 01/16/2019   Procedure: Left deep brain stimulator electrode revision.;  Surgeon: Manya Sells, MD;  Location: [facility] OR;  Service: Neurosurgery;  Laterality: Left;   SUBTHALAMIC STIMULATOR INSERTION Left 01/23/2019   Procedure: Repositioning of Left Deep Brain Stimulator Electrode;  Surgeon: Manya Sells, MD;  Location: [facility] OR;  Service: Neurosurgery;  Laterality: Left;   TONSILLECTOMY     Patient Active Problem List   Diagnosis Date Noted   Melena 06/01/2022   Acute cystitis without hematuria 05/31/2022   Upper GI bleed 05/31/2022   Generalized weakness 05/30/2022   Abdominal pain 05/30/2022   OSA (obstructive sleep apnea) 05/30/2022   Hypothyroidism 05/30/2022   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) 12/14/2021   Mixed hyperlipidemia 12/14/2021   Essential hypertension 12/14/2021  Palpitations 12/14/2021   Angina pectoris (HCC) 12/14/2021   Urge incontinence 01/15/2021   History of UTI 01/15/2021   Abnormal urine findings 01/15/2021   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   History of nephrolithiasis 01/15/2021   DVT, lower extremity, proximal, acute, right (HCC) 02/26/2020   Open scalp wound 01/22/2020   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HCC)    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   Parkinson's disease (HCC) 01/16/2018    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) 01/16/2018   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    ONSET DATE: exacerbation 06/21/23, Parkinson's diagnosis 2010  REFERRING DIAG: parkinsons disease with fluctuating dyskinesia  THERAPY DIAG:  Parkinson's disease with dyskinesia, with fluctuations (HCC)  Difficulty in walking, not elsewhere classified  Other lack of coordination  Rationale for Evaluation and Treatment: Rehabilitation  SUBJECTIVE:                                                                                                                                                                                             SUBJECTIVE STATEMENT: I am alright most days, my back is still hurting.    EVAL-Pt and his wife report long history of parkinson's,  biggest difficulty with his mobility that they note is R lower back  pain and labored supine to sit movement, also poor foot clearance.  He doesn't like relying on the walker Pt accompanied by: significant other  PERTINENT HISTORY: parkinson's diagnosed greater than 10 yrs ago, deep brain stimulator R, had in L as well but had to be removed for various reasons.  walks around neighborhood 3 x week with aide Tuesdays and Thursdays parkisons bicycle class 2 x Lynelle Sara training for upper body work out Referred to PT and ST by neurologist   PAIN:  Are you having pain? R lower back pain constant  PRECAUTIONS: Fall and Other: deep brain stimulator on R   RED FLAGS: None   WEIGHT BEARING RESTRICTIONS: No  FALLS: Has patient fallen in last 6 months? No  LIVING ENVIRONMENT: Lives with: lives with their spouse Lives in: House/apartment Stairs: has internal steps, but has had home renovated and adapted so the he and his wife live in downstairs apartment, all one level with bathroom and kitchen Has following equipment at home: Single point cane, Environmental consultant - 4 wheeled, Tour manager, Grab bars, and Ramped entry  PLOF: Independent  PATIENT GOALS:  improve bed mobility and improve gait, stability, avoid falls  OBJECTIVE:  Note: Objective measures were completed at Evaluation unless otherwise noted.  DIAGNOSTIC FINDINGS: see neuro notes, has deep brain stimulator R   COGNITION: Overall cognitive status: Within functional limits for tasks assessed  SENSATION: WFL  COORDINATION: Slowed movements B and noted "curling" R hand and R foot at times, with gait festinating on turns   EDEMA:  None noted  MUSCLE TONE: noted curling R fingers, toes, knee flex and hip flex at times, randomly during evaluation  MUSCLE LENGTH: Hamstrings: Right -30 deg; Left -30 deg Thomas test: Right nt deg; Left nt deg    POSTURE: Standing with some forward lean, weight on forefeet, L calf atrophied pt and wife report chronic after L lumbar surgery.     LOWER EXTREMITY ROM:   grossly wfl, hips not assessed as not positioned on mat today   LOWER EXTREMITY MMT:    MMT Right Eval Left Eval  Hip flexion 4 4-  Hip extension    Hip abduction    Hip adduction    Hip internal rotation    Hip external rotation    Knee flexion 4 4  Knee extension 4 4-  Ankle dorsiflexion 4 3+  Ankle plantarflexion 4- 3-  Ankle inversion    Ankle eversion    (Blank rows = not tested)  BED MOBILITY:  Not tested  TRANSFERS: I but slowed, uses hands on seat or with rollator  GAIT: Findings: Gait Characteristics: step to pattern, decreased step length- Right, decreased step length- Left, decreased stride length, decreased hip/knee flexion- Right, decreased hip/knee flexion- Left, shuffling, festinating, trunk flexed, and narrow BOS, Distance walked: 100', Assistive device utilized:Walker - 4 wheeled, Level of assistance: SBA, and Comments: festinating noted on turns, tends to turn L  FUNCTIONAL TESTS:  30 seconds chair stand test 6 reps Timed up and go (TUG): 33.39 with rollator Standing static feet together 15 sec Standing semi tandem 10 sec each leg,  needed CGA with L foot forward Standing marching, required B hand hold support to maintain balance                                                                                                                              TREATMENT DATE: 07/27/23 NuStep L5x43mins  LAQ 2# 2x10 HS curls green 2x10  Hip abduction green 2x10 STS 2x10  Stretching with pball rotations and flexion x5     07/06/23 Evaluation, advised to attempt prone lying on bed 10 to 15 min  to stretch body into extension( pt doesn't ever lie prone)   PATIENT EDUCATION: Education details: POC, goals Person educated: Patient and Spouse Education method: Explanation, Demonstration, Tactile cues, and Verbal cues Education comprehension: verbalized understanding, verbal cues required, and needs further education  HOME EXERCISE PROGRAM: TBD  GOALS: Goals reviewed with patient? Yes  SHORT TERM GOALS: Target date: 4 weeks, 08/03/23  I home program for posture and for hamstring, hip flexor stretchig Baseline:TBD Goal status: INITIAL  LONG TERM GOALS: Target date: 09/28/23: 12 weeks   30 sec sit to stand improve from 6 reps to 12 reps for improved coordination, strength, endurance LEs Baseline:  Goal status: INITIAL  2.  I and pain free supine to sit motion , rolling to R Baseline: very painful per pt and wife history, not specifically assessed in clinic today Goal status: INITIAL  3.  TUG score improve from 33 sec to 18 or less for reduced fall risk   Baseline:  Goal status: INITIAL  4.  Improve strength LE's for B quads, hamstrings to 4+5 for improved functional strength Baseline: 4- Goal status: INITIAL   ASSESSMENT:  CLINICAL IMPRESSION: Patient returns for first PT session. He walks in with rollator and has some festinating and shuffling gait. His foot caught but he was able to prevent tripping. We started with some general low level strengthening today. He is attending Parkinson's cycling on Tuesdays and  Thursdays and is going to a private gym on Mondays and Fridays. Does well with STS from elevated mat table. Will continue to progress to improve his overall function.    EVAL Patient is a 78 y.o. male who was evaluated today by physical therapy due to referral from his neurologist, with decline in function, particularly control of R extremities, with his Parkinson's diagnosis.  He and his wife have been proactive about maintaining his function, he attends a training/ strengthening session locally 2 x week , attends parkinson's cycling weekly, and walks around 0.6 miles with his aide 3 x week outdoors with his rollator.  They recently have adapted their home with a main floor renovation so that their bedroom, bath, kitchen are all on one level.   Main problems identified today are weakness B LE's, L ankle is weak chronically due to lumbar radiculopathy and B knee, hips musculature is weak.  He also has coordination deficits with festinating gait particularly on turns and static / dynamic balance deficits.  Has chronic R sided lower back pain which is particularly intense with moving supine to sit.  He should benefit from physical therapy intervention to address his deficits and update his current routine for stretching and strengthening at home.   OBJECTIVE IMPAIRMENTS: Abnormal gait, decreased activity tolerance, decreased balance, decreased coordination, decreased endurance, decreased mobility, difficulty walking, decreased strength, hypomobility, impaired perceived functional ability, impaired UE functional use, postural dysfunction, and pain.   ACTIVITY LIMITATIONS: carrying, lifting, squatting, stairs, transfers, and locomotion level  PARTICIPATION LIMITATIONS: meal prep, cleaning, laundry, shopping, community activity, and church  PERSONAL FACTORS: Age, Behavior pattern, Fitness, Past/current experiences, Time since onset of injury/illness/exacerbation, and 1-2 comorbidities: h/o L lumbar  radiculopathy and weakness L LE, chronic R sided lower back pain, are also affecting patient's functional outcome.   REHAB POTENTIAL: Good  CLINICAL DECISION MAKING: Evolving/moderate complexity  EVALUATION COMPLEXITY: Moderate  PLAN:  PT FREQUENCY: 2x/week  PT DURATION: 12 weeks  PLANNED INTERVENTIONS: 97110-Therapeutic exercises, 97530- Therapeutic activity, 97112- Neuromuscular re-education, 97535- Self Care, and 95188- Manual therapy  PLAN FOR NEXT SESSION: would recommend use treatment table to position in prone for stretching, also instruct in lower back stretches to utilize prior to moving supine to sit, bulk strengthening LE's   Donavon Fudge, PT, DPT 07/27/2023, 2:39 PM

## 2023-07-27 ENCOUNTER — Ambulatory Visit

## 2023-07-27 DIAGNOSIS — G20B2 Parkinson's disease with dyskinesia, with fluctuations: Secondary | ICD-10-CM | POA: Diagnosis not present

## 2023-07-27 DIAGNOSIS — R278 Other lack of coordination: Secondary | ICD-10-CM

## 2023-07-27 DIAGNOSIS — R262 Difficulty in walking, not elsewhere classified: Secondary | ICD-10-CM

## 2023-07-28 ENCOUNTER — Other Ambulatory Visit: Payer: Self-pay

## 2023-07-28 ENCOUNTER — Ambulatory Visit: Admitting: Speech Pathology

## 2023-07-28 ENCOUNTER — Encounter: Payer: Self-pay | Admitting: Speech Pathology

## 2023-07-28 ENCOUNTER — Ambulatory Visit

## 2023-07-28 DIAGNOSIS — G20B2 Parkinson's disease with dyskinesia, with fluctuations: Secondary | ICD-10-CM | POA: Diagnosis not present

## 2023-07-28 DIAGNOSIS — R278 Other lack of coordination: Secondary | ICD-10-CM

## 2023-07-28 DIAGNOSIS — Z741 Need for assistance with personal care: Secondary | ICD-10-CM

## 2023-07-28 DIAGNOSIS — R41841 Cognitive communication deficit: Secondary | ICD-10-CM

## 2023-07-28 DIAGNOSIS — R262 Difficulty in walking, not elsewhere classified: Secondary | ICD-10-CM

## 2023-07-28 NOTE — Therapy (Signed)
 OUTPATIENT SPEECH LANGUAGE PATHOLOGY TREATMENT   Patient Name: Patrick Ross MRN: [id number] DOB:[birth year], 78 y.o., male Today's Date: 07/28/2023  PCP: Jayne Mews, MD REFERRING PROVIDER: Shirline Dover, DO  END OF SESSION:  End of Session - 07/28/23 1320     Visit Number 5    Number of Visits 17    Date for SLP Re-Evaluation 09/06/23    SLP Start Time 1315    SLP Stop Time  1355    SLP Time Calculation (min) 40 min    Activity Tolerance Patient tolerated treatment well             Past Medical History:  Diagnosis Date   Depression    DVT (deep venous thrombosis) (HCC)    GERD (gastroesophageal reflux disease)    History of kidney stones    passed   Hypertension    Hypothyroidism    Lumbar stenosis    OSA (obstructive sleep apnea)    wears cpap   Parkinson's disease (HCC)    PONV (postoperative nausea and vomiting)    REM behavioral disorder    Urinary incontinence    Past Surgical History:  Procedure Laterality Date   BACK SURGERY     x 2 - not fusion   BIOPSY  06/01/2022   Procedure: BIOPSY;  Surgeon: Baldo Bonds, MD;  Location: [facility];  Service: Gastroenterology;;   COLONOSCOPY     DEEP BRAIN STIMULATOR PLACEMENT     left 12/27/2012, right 12/10/2011, left revision 02/02/2013, replacement bilateral 11/21/2015   ESOPHAGOGASTRODUODENOSCOPY (EGD) WITH PROPOFOL  N/A 06/01/2022   Procedure: ESOPHAGOGASTRODUODENOSCOPY (EGD) WITH PROPOFOL ;  Surgeon: Baldo Bonds, MD;  Location: [facility];  Service: Gastroenterology;  Laterality: N/A;   MINOR PLACEMENT OF FIDUCIAL N/A 01/09/2019   Procedure: Fiducial placement;  Surgeon: Manya Sells, MD;  Location: [facility] OR;  Service: Neurosurgery;  Laterality: N/A;  Fiducial placement   PARTIAL NEPHRECTOMY Right 2005 ish   PULSE GENERATOR IMPLANT Right 01/23/2019   Procedure: Right chest implantable pulse generator change;  Surgeon: Manya Sells, MD;  Location: [facility] OR;  Service: Neurosurgery;  Laterality:  Right;   SPINAL FUSION     lumbar Fusion x 2   SUBTHALAMIC STIMULATOR BATTERY REPLACEMENT Left 02/02/2018   Procedure: Change implantable pulse generator battery, Left chest;  Surgeon: Manya Sells, MD;  Location: [facility] OR;  Service: Neurosurgery;  Laterality: Left;   SUBTHALAMIC STIMULATOR BATTERY REPLACEMENT N/A 01/22/2020   Procedure: REMOVAL OF LEFT BRAIN DEEP BRAIN STIMULATOR ELECTRODE, EXTENSION, AND PULSE GENERATOR;  Surgeon: Manya Sells, MD;  Location: [facility] OR;  Service: Neurosurgery;  Laterality: N/A;   SUBTHALAMIC STIMULATOR BATTERY REPLACEMENT Right 02/23/2022   Procedure: CHANGE IPG BATTERY, RT CHEST;  Surgeon: Dawley, Colby Daub, DO;  Location: [facility] OR;  Service: Neurosurgery;  Laterality: Right;   SUBTHALAMIC STIMULATOR INSERTION Left 01/16/2019   Procedure: Left deep brain stimulator electrode revision.;  Surgeon: Manya Sells, MD;  Location: [facility] OR;  Service: Neurosurgery;  Laterality: Left;   SUBTHALAMIC STIMULATOR INSERTION Left 01/23/2019   Procedure: Repositioning of Left Deep Brain Stimulator Electrode;  Surgeon: Manya Sells, MD;  Location: [facility] OR;  Service: Neurosurgery;  Laterality: Left;   TONSILLECTOMY     Patient Active Problem List   Diagnosis Date Noted   Melena 06/01/2022   Acute cystitis without hematuria 05/31/2022   Upper GI bleed 05/31/2022   Generalized weakness 05/30/2022   Abdominal pain 05/30/2022   OSA (obstructive sleep apnea) 05/30/2022   Hypothyroidism 05/30/2022   Orthostatic hypotension due to  Parkinson's disease (HCC) 12/14/2021   Mixed hyperlipidemia 12/14/2021   Essential hypertension 12/14/2021   Palpitations 12/14/2021   Angina pectoris (HCC) 12/14/2021   Urge incontinence 01/15/2021   History of UTI 01/15/2021   Abnormal urine findings 01/15/2021   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   History of nephrolithiasis 01/15/2021   DVT, lower extremity, proximal, acute, right (HCC) 02/26/2020   Open scalp wound 01/22/2020   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HCC)     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   Parkinson's disease (HCC) 01/16/2018   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   Pt accompanied by: significant other; [name]  PERTINENT HISTORY: PD (hx of STN DBS), depression, sleep apnea  PAIN:  Are you having pain? Yes: NPRS scale: 3 Pain location: lower back to R leg Pain description: chronic Aggravating factors: sitting up Relieving factors: N/A  FALLS: Has patient fallen in last 6 months?  Yes  LIVING ENVIRONMENT: Lives with: lives with their family; 1 Lives in: House/apartment  PLOF:  Level of assistance: Independent with ADLs, Independent with IADLs Employment: Retired  PATIENT GOALS: voice, cognition  OBJECTIVE:  Note: Objective measures were completed at Evaluation unless otherwise noted.  DIAGNOSTIC FINDINGS: No imaging on this EMR since 2021 CT Head.   COGNITION: Overall cognitive status: Impaired Areas of impairment:  Attention: Impaired: Selective, Alternating, Divided Memory: Impaired: Short term Prospective Executive function: Impaired: Organization and Planning Functional deficits: Difficulty with recall of details  MOTOR SPEECH: Overall motor speech: impaired Level of impairment: Conversation Respiration: Reduced support Phonation: normal Resonance: WFL Articulation: Impaired: conversation Intelligibility: Intelligibility reduced Motor planning: Appears intact Motor speech errors: NA Interfering components: NA Effective technique: slow rate, increased vocal intensity, and over articulate  ORAL MOTOR  EXAMINATION: Overall status: WFL Comments:    CLINICAL SWALLOW ASSESSMENT:   Current diet: regular and thin liquids Dentition: adequate natural dentition Patient directly observed with POs: Yes: thin liquids  Feeding: able to feed self Liquids provided by: cup and straw Oral phase signs and symptoms: NA Pharyngeal phase signs and symptoms: multiple swallows Comments: [facility] Swallow Protocol PASS   *Pt reports some difficulty with drier foods and occasionally getting strangled with liquids. No observed this date. To monitor.   SOCIAL HISTORY: Occupation: Warden/ranger intake: suboptimal Caffeine/alcohol intake: minimal Daily voice use: minimal  PERCEPTUAL VOICE ASSESSMENT: Voice quality: normal Vocal abuse: NA Resonance: normal Respiratory function: thoracic breathing  LSVT-LOUD VOICE EVALUATION: Maximum phonation time for sustained "ah": 13 sec Mean intensity during sustained "ah": 80 dB  Mean fundamental frequency during sustained "ah": 111 Hz  Mean intensity sustained during conversational speech: 70 dB Habitual pitch: 110 Hz Highest dynamic pitch when altering pitch from a low note to a high note: 175 Hz Lowest dynamic pitch when altering from a high note to a low note: 106 Hz Highest dynamic pitch during conversational speech: 159 Hz Lowest dynamic pitch during conversational speech: 82 Hz Average time patient was able to sustain /s/: 4.8 seconds Average time patient was able to sustain /z/: 13.8 seconds s/z ratio : .34 (indicating suspected respiratory inefficiency)   Speech is characterized by Other: . For trained listener in quiet environment with context, intelligibility was >90%.  Patient able to improve all parameters with model ("Loud like me") Stimulability: Improved vocal quality with loud voice (81 dB) for sustained vowel, functional phrases averaged 78 dB and improved pitch range with model.     The Communication Effectiveness Survey is a patient-reported  outcome measure in which the patient rates their own effectiveness in different communication situations. A higher score indicates greater effectiveness. Pt's self-rating was 27/32. Patient reported mild difficulty conversing with a telephone, conversing in a noisy environment, speaking when emotional, conversing when traveling in a car, having conversation with someone at a distance.   STANDARDIZED ASSESSMENTS: SLUMS: 24/30  PATIENT REPORTED OUTCOME MEASURES (PROM): Cognitive function: Short Form: 34 and Communication Effectiveness Survey: 27                                                                                                                            TREATMENT DATE:   07/28/23: Pt was seen for skilled ST services targeting education for wife and design of templates for EMST, SPEAK OUT, and Medication log due to pt having difficulty remembering to complete exercises. SLP encouraged wife to help patient set up a "memory and exercise station" with calendar, medication log, and exercise log. Pt completed reading and cognitive portion of SPEAK OUT while SLP designed templates. Provided in clear sheet protector at end of session. Pt to bring EMST next session.   07/21/23: Pt was seen for skilled ST services targeting review of SPEAK OUT. Pt reports he did not complete any voice exercises due to company this week. SLP explained the importance of consistency with exercises for demonstrated improvement. He has not completed his EMST exercises in over a year. SLP reassessed on his personal device. Pt encouraged to set a routine to complete voice and respiratory exercises.    EMST 150 3X5 reps @ 75% of MEP - 30 cmH20 with 15 second rest  (45 cmH20 MEP)   07/12/23: Pt was seen for skilled ST services targeting review of SPEAK OUT. SLP educated on "Living with Intent Means To..." found in his HEP book. Patient reported that "speaking with conscious effort" resonated the most with him and his idea  of intent. SLP led patient through vocal warm up re: May-Me-My-Moe-Mo x5, sustained phonation /ah/ x5, pitch glides x5/each. Pt completed cognitive component of SPEAK OUT book independently (up and ___). Pt demonstrated completion of SPEAK OUT  exercises with spv. SLP set pt up with a system to separate his completion from previous therapy course (using pencil and X marks vs. Pen and Check Mark). Pt completed Lesson 1 with therapist for AM portion - to complete PM portion at home this evening.   07/08/23: Pt was seen for skilled ST services targeting continued voice assessment - see above. Pt also completed PROMs for cognitive function and dysarthria. Pt brought in his "Speak Out" book from previous therapist. SLP encouraged pt to continue with exercises. SLP is not "speak out" certified, but plans to use  similar cues with exemplars from book for continuity of care.  PATIENT EDUCATION: Education details: PD and SLP role  Person educated: Patient and Spouse Education method: Explanation Education comprehension: verbalized understanding   GOALS: Goals reviewed with patient? Yes  SHORT TERM GOALS: Target date: 08/07/23  Pt will complete baseline voice measures Baseline: Goal status:MET  2.  Pt will complete PROMs Baseline:  Goal status: MET  3.  Pt will demonstrate voice HEP independently Baseline:  Goal status: INITIAL  4.  Pt/wife will verbalize 3 memory strategies to improve recall of important information Baseline:  Goal status: INITIAL  5.   Baseline:  Goal status: INITIAL  6.   Baseline:  Goal status: INITIAL  LONG TERM GOALS: Target date: 09/06/23  Pt will improve score on PROMs Baseline:  Goal status: INITIAL  2.  Pt will demonstrate improved vocal intensity with sustained phonation Baseline:  Goal status: INITIAL  3.  Pt will demonstrate improved vocal intensity in 5 min, unstructured conversation Baseline:  Goal status: INITIAL  4.  Pt/wife will report success  using memory strategies at home Baseline:  Goal status: INITIAL  5.   Baseline:  Goal status: INITIAL  6.   Baseline:  Goal status: INITIAL  ASSESSMENT:  CLINICAL IMPRESSION: Pt is a 78 yo male who presents to ST OP for evaluation for sx 2/2 Parkinson's Disease.  Pt endorses changes in vocal intensity, breath support, and cognition re: focus, memory. Pt has received speech therapy before at [facility] ~2 years ago. Wife reports he has been referred for a neuropsych evaluation for cognition; therefore, SLP to just complete a screen at this time. Pt was assessed using SLUMS - scoring a 24/30. Impairments noted in recall and word generation. Pt with complaints of occasional difficulty with thin liquids and solids. Pt passed YSP, with no overt s/sx of aspiration. Pt reports he has some difficulty with drier solids getting "stuck". SLP suggested moister meats re: chicken thighs vs chicken breast, cutting items into smaller pieces, and adding sauces/gravies to drier foods. Unable to complete voice measures due to time constraints. To complete next session. SLP rec skilled ST services to address cognitive-communication impairment, dysarthria, and monitor dysphagia.    OBJECTIVE IMPAIRMENTS: include attention, memory, executive functioning, and dysarthria. These impairments are limiting patient from effectively communicating at home and in community. Factors affecting potential to achieve goals and functional outcome are NA. Patient will benefit from skilled SLP services to address above impairments and improve overall function.  REHAB POTENTIAL: Good  PLAN:  SLP FREQUENCY: 1-2x/week  SLP DURATION: 8 weeks  PLANNED INTERVENTIONS: Diet toleration management , Cueing hierachy, Internal/external aids, Functional tasks, Multimodal communication approach, SLP instruction and feedback, Compensatory strategies, Patient/family education, and [address] Treatment of speech (30 or 45 min)      [employer], CCC-SLP 07/28/2023, 1:21 PM

## 2023-07-28 NOTE — Therapy (Signed)
 OUTPATIENT PHYSICAL THERAPY NEURO TREATMENT   Patient Name: Patrick Ross MRN: [id number] DOB:13-Mar-1945, 78 y.o., male Today's Date: 07/28/2023   PCP: Jayne Mews MD REFERRING PROVIDER: Shirline Dover DO  END OF SESSION:  PT End of Session - 07/28/23 1311     Visit Number 3    Authorization Type Medicare    PT Start Time 1315    PT Stop Time 1400    PT Time Calculation (min) 45 min               Past Medical History:  Diagnosis Date   Depression    DVT (deep venous thrombosis) (HCC)    GERD (gastroesophageal reflux disease)    History of kidney stones    passed   Hypertension    Hypothyroidism    Lumbar stenosis    OSA (obstructive sleep apnea)    wears cpap   Parkinson's disease (HCC)    PONV (postoperative nausea and vomiting)    REM behavioral disorder    Urinary incontinence    Past Surgical History:  Procedure Laterality Date   BACK SURGERY     x 2 - not fusion   BIOPSY  06/01/2022   Procedure: BIOPSY;  Surgeon: Baldo Bonds, MD;  Location: [facility];  Service: Gastroenterology;;   COLONOSCOPY     DEEP BRAIN STIMULATOR PLACEMENT     left 12/27/2012, right 12/10/2011, left revision 02/02/2013, replacement bilateral 11/21/2015   ESOPHAGOGASTRODUODENOSCOPY (EGD) WITH PROPOFOL  N/A 06/01/2022   Procedure: ESOPHAGOGASTRODUODENOSCOPY (EGD) WITH PROPOFOL ;  Surgeon: Baldo Bonds, MD;  Location: [facility];  Service: Gastroenterology;  Laterality: N/A;   MINOR PLACEMENT OF FIDUCIAL N/A 01/09/2019   Procedure: Fiducial placement;  Surgeon: Manya Sells, MD;  Location: [facility] OR;  Service: Neurosurgery;  Laterality: N/A;  Fiducial placement   PARTIAL NEPHRECTOMY Right 2005 ish   PULSE GENERATOR IMPLANT Right 01/23/2019   Procedure: Right chest implantable pulse generator change;  Surgeon: Manya Sells, MD;  Location: [facility] OR;  Service: Neurosurgery;  Laterality: Right;   SPINAL FUSION     lumbar Fusion x 2   SUBTHALAMIC STIMULATOR BATTERY  REPLACEMENT Left 02/02/2018   Procedure: Change implantable pulse generator battery, Left chest;  Surgeon: Manya Sells, MD;  Location: [facility] OR;  Service: Neurosurgery;  Laterality: Left;   SUBTHALAMIC STIMULATOR BATTERY REPLACEMENT N/A 01/22/2020   Procedure: REMOVAL OF LEFT BRAIN DEEP BRAIN STIMULATOR ELECTRODE, EXTENSION, AND PULSE GENERATOR;  Surgeon: Manya Sells, MD;  Location: [facility] OR;  Service: Neurosurgery;  Laterality: N/A;   SUBTHALAMIC STIMULATOR BATTERY REPLACEMENT Right 02/23/2022   Procedure: CHANGE IPG BATTERY, RT CHEST;  Surgeon: Dawley, Colby Daub, DO;  Location: [facility] OR;  Service: Neurosurgery;  Laterality: Right;   SUBTHALAMIC STIMULATOR INSERTION Left 01/16/2019   Procedure: Left deep brain stimulator electrode revision.;  Surgeon: Manya Sells, MD;  Location: [facility] OR;  Service: Neurosurgery;  Laterality: Left;   SUBTHALAMIC STIMULATOR INSERTION Left 01/23/2019   Procedure: Repositioning of Left Deep Brain Stimulator Electrode;  Surgeon: Manya Sells, MD;  Location: [facility] OR;  Service: Neurosurgery;  Laterality: Left;   TONSILLECTOMY     Patient Active Problem List   Diagnosis Date Noted   Melena 06/01/2022   Acute cystitis without hematuria 05/31/2022   Upper GI bleed 05/31/2022   Generalized weakness 05/30/2022   Abdominal pain 05/30/2022   OSA (obstructive sleep apnea) 05/30/2022   Hypothyroidism 05/30/2022   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) 12/14/2021   Mixed hyperlipidemia 12/14/2021   Essential hypertension 12/14/2021  Palpitations 12/14/2021   Angina pectoris (HCC) 12/14/2021   Urge incontinence 01/15/2021   History of UTI 01/15/2021   Abnormal urine findings 01/15/2021   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   History of nephrolithiasis 01/15/2021   DVT, lower extremity, proximal, acute, right (HCC) 02/26/2020   Open scalp wound 01/22/2020   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HCC)    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   Parkinson's disease (HCC)  01/16/2018   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) 01/16/2018   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    ONSET DATE: exacerbation 06/21/23, Parkinson's diagnosis 2010  REFERRING DIAG: parkinsons disease with fluctuating dyskinesia  THERAPY DIAG:  Parkinson's disease with dyskinesia, with fluctuations (HCC)  Difficulty in walking, not elsewhere classified  Other lack of coordination  Rationale for Evaluation and Treatment: Rehabilitation  SUBJECTIVE:                                                                                                                                                                                             SUBJECTIVE STATEMENT: I am tired, I went to cycling this morning. I only went to 4 miles in .    EVAL-Pt and his wife report long history of parkinson's,  biggest difficulty with his mobility that they note is R lower back  pain and labored supine to sit movement, also poor foot clearance.  He doesn't like relying on the walker Pt accompanied by: significant other  PERTINENT HISTORY: parkinson's diagnosed greater than 10 yrs ago, deep brain stimulator R, had in L as well but had to be removed for various reasons.  walks around neighborhood 3 x week with aide Tuesdays and Thursdays parkisons bicycle class 2 x Lynelle Sara training for upper body work out Referred to PT and ST by neurologist   PAIN:  Are you having pain? R lower back pain constant  PRECAUTIONS: Fall and Other: deep brain stimulator on R   RED FLAGS: None   WEIGHT BEARING RESTRICTIONS: No  FALLS: Has patient fallen in last 6 months? No  LIVING ENVIRONMENT: Lives with: lives with their spouse Lives in: House/apartment Stairs: has internal steps, but has had home renovated and adapted so the he and his wife live in downstairs apartment, all one level with bathroom and kitchen Has following equipment at home: Single point cane, Environmental consultant - 4 wheeled, Tour manager, Grab bars, and Ramped  entry  PLOF: Independent  PATIENT GOALS: improve bed mobility and improve gait, stability, avoid falls  OBJECTIVE:  Note: Objective measures were completed at Evaluation unless otherwise noted.  DIAGNOSTIC FINDINGS: see neuro notes, has deep brain stimulator R   COGNITION: Overall cognitive status:  Within functional limits for tasks assessed   SENSATION: WFL  COORDINATION: Slowed movements B and noted "curling" R hand and R foot at times, with gait festinating on turns   EDEMA:  None noted  MUSCLE TONE: noted curling R fingers, toes, knee flex and hip flex at times, randomly during evaluation  MUSCLE LENGTH: Hamstrings: Right -30 deg; Left -30 deg Thomas test: Right nt deg; Left nt deg    POSTURE: Standing with some forward lean, weight on forefeet, L calf atrophied pt and wife report chronic after L lumbar surgery.     LOWER EXTREMITY ROM:   grossly wfl, hips not assessed as not positioned on mat today   LOWER EXTREMITY MMT:    MMT Right Eval Left Eval  Hip flexion 4 4-  Hip extension    Hip abduction    Hip adduction    Hip internal rotation    Hip external rotation    Knee flexion 4 4  Knee extension 4 4-  Ankle dorsiflexion 4 3+  Ankle plantarflexion 4- 3-  Ankle inversion    Ankle eversion    (Blank rows = not tested)  BED MOBILITY:  Not tested  TRANSFERS: I but slowed, uses hands on seat or with rollator  GAIT: Findings: Gait Characteristics: step to pattern, decreased step length- Right, decreased step length- Left, decreased stride length, decreased hip/knee flexion- Right, decreased hip/knee flexion- Left, shuffling, festinating, trunk flexed, and narrow BOS, Distance walked: 100', Assistive device utilized:Walker - 4 wheeled, Level of assistance: SBA, and Comments: festinating noted on turns, tends to turn L  FUNCTIONAL TESTS:  30 seconds chair stand test 6 reps Timed up and go (TUG): 33.39 with rollator Standing static feet together 15  sec Standing semi tandem 10 sec each leg, needed CGA with L foot forward Standing marching, required B hand hold support to maintain balance                                                                                                                              TREATMENT DATE: 07/28/23 Supine HS stretch and SKTC  Lower trunk rotations x10 SLR 2x10 Feet on pball rotations and knees to chest AB isometric holds x10  NuStep L5x93mins  STS 3x5   07/27/23 NuStep L5x65mins  LAQ 2# 2x10 HS curls green 2x10  Hip abduction green 2x10 STS 2x10  Stretching with pball rotations and flexion x5     07/06/23 Evaluation, advised to attempt prone lying on bed 10 to 15 min  to stretch body into extension( pt doesn't ever lie prone)   PATIENT EDUCATION: Education details: POC, goals Person educated: Patient and Spouse Education method: Explanation, Demonstration, Tactile cues, and Verbal cues Education comprehension: verbalized understanding, verbal cues required, and needs further education  HOME EXERCISE PROGRAM: TBD  GOALS: Goals reviewed with patient? Yes  SHORT TERM GOALS: Target date: 4 weeks, 08/03/23  I home program for posture and for hamstring, hip flexor  stretchig Baseline:TBD Goal status: INITIAL  LONG TERM GOALS: Target date: 09/28/23: 12 weeks   30 sec sit to stand improve from 6 reps to 12 reps for improved coordination, strength, endurance LEs Baseline:  Goal status: INITIAL  2.  I and pain free supine to sit motion , rolling to R Baseline: very painful per pt and wife history, not specifically assessed in clinic today Goal status: INITIAL  3.  TUG score improve from 33 sec to 18 or less for reduced fall risk   Baseline:  Goal status: INITIAL  4.  Improve strength LE's for B quads, hamstrings to 4+5 for improved functional strength Baseline: 4- Goal status: INITIAL   ASSESSMENT:  CLINICAL IMPRESSION: Patient returns with some ongoing pain in his back. We  worked on some supine exercises. He initially has pain when he goes to lay down but it eventually gets better. Has some limited motion with trunk rotations due to tightness in R lower back. Patient moves slow but is able to do all interventions. modA needs for supine to sit transfer, some dizziness that settles fairly quick once he sits up. Reports he does it with a railing at home. Some fatigue at end of session with sit to stands. Needs cues to take large steps when walking as his toes tend to catch. Will continue to progress to improve his overall function.    EVAL Patient is a 78 y.o. male who was evaluated today by physical therapy due to referral from his neurologist, with decline in function, particularly control of R extremities, with his Parkinson's diagnosis.  He and his wife have been proactive about maintaining his function, he attends a training/ strengthening session locally 2 x week , attends parkinson's cycling weekly, and walks around 0.6 miles with his aide 3 x week outdoors with his rollator.  They recently have adapted their home with a main floor renovation so that their bedroom, bath, kitchen are all on one level.   Main problems identified today are weakness B LE's, L ankle is weak chronically due to lumbar radiculopathy and B knee, hips musculature is weak.  He also has coordination deficits with festinating gait particularly on turns and static / dynamic balance deficits.  Has chronic R sided lower back pain which is particularly intense with moving supine to sit.  He should benefit from physical therapy intervention to address his deficits and update his current routine for stretching and strengthening at home.   OBJECTIVE IMPAIRMENTS: Abnormal gait, decreased activity tolerance, decreased balance, decreased coordination, decreased endurance, decreased mobility, difficulty walking, decreased strength, hypomobility, impaired perceived functional ability, impaired UE functional use,  postural dysfunction, and pain.   ACTIVITY LIMITATIONS: carrying, lifting, squatting, stairs, transfers, and locomotion level  PARTICIPATION LIMITATIONS: meal prep, cleaning, laundry, shopping, community activity, and church  PERSONAL FACTORS: Age, Behavior pattern, Fitness, Past/current experiences, Time since onset of injury/illness/exacerbation, and 1-2 comorbidities: h/o L lumbar radiculopathy and weakness L LE, chronic R sided lower back pain, are also affecting patient's functional outcome.   REHAB POTENTIAL: Good  CLINICAL DECISION MAKING: Evolving/moderate complexity  EVALUATION COMPLEXITY: Moderate  PLAN:  PT FREQUENCY: 2x/week  PT DURATION: 12 weeks  PLANNED INTERVENTIONS: 97110-Therapeutic exercises, 97530- Therapeutic activity, 97112- Neuromuscular re-education, 97535- Self Care, and 16109- Manual therapy  PLAN FOR NEXT SESSION: would recommend use treatment table to position in prone for stretching, also instruct in lower back stretches to utilize prior to moving supine to sit, bulk strengthening LE's   Donavon Fudge, PT,  DPT 07/28/2023, 1:57 PM

## 2023-08-02 ENCOUNTER — Ambulatory Visit: Admitting: Speech Pathology

## 2023-08-02 ENCOUNTER — Ambulatory Visit: Attending: Neurology

## 2023-08-02 ENCOUNTER — Encounter: Payer: Self-pay | Admitting: Speech Pathology

## 2023-08-02 VITALS — BP 107/69 | HR 82

## 2023-08-02 DIAGNOSIS — R41844 Frontal lobe and executive function deficit: Secondary | ICD-10-CM | POA: Insufficient documentation

## 2023-08-02 DIAGNOSIS — R262 Difficulty in walking, not elsewhere classified: Secondary | ICD-10-CM | POA: Insufficient documentation

## 2023-08-02 DIAGNOSIS — R278 Other lack of coordination: Secondary | ICD-10-CM | POA: Insufficient documentation

## 2023-08-02 DIAGNOSIS — R29818 Other symptoms and signs involving the nervous system: Secondary | ICD-10-CM | POA: Diagnosis present

## 2023-08-02 DIAGNOSIS — R41841 Cognitive communication deficit: Secondary | ICD-10-CM | POA: Insufficient documentation

## 2023-08-02 DIAGNOSIS — R471 Dysarthria and anarthria: Secondary | ICD-10-CM

## 2023-08-02 DIAGNOSIS — R41840 Attention and concentration deficit: Secondary | ICD-10-CM | POA: Diagnosis present

## 2023-08-02 DIAGNOSIS — G20B2 Parkinson's disease with dyskinesia, with fluctuations: Secondary | ICD-10-CM | POA: Insufficient documentation

## 2023-08-02 DIAGNOSIS — R2689 Other abnormalities of gait and mobility: Secondary | ICD-10-CM | POA: Insufficient documentation

## 2023-08-02 NOTE — Therapy (Signed)
 OUTPATIENT SPEECH LANGUAGE PATHOLOGY TREATMENT   Patient Name: Patrick Ross MRN: [id number] DOB:[birth year], 78 y.o., male Today's Date: 08/02/2023  PCP: Jayne Mews, MD REFERRING PROVIDER: Shirline Dover, DO  END OF SESSION:  End of Session - 08/02/23 1238     Visit Number 6    Number of Visits 17    Date for SLP Re-Evaluation 09/06/23    SLP Start Time 1230    SLP Stop Time  0100    SLP Time Calculation (min) 750 min    Activity Tolerance Patient tolerated treatment well             Past Medical History:  Diagnosis Date   Depression    DVT (deep venous thrombosis) (HCC)    GERD (gastroesophageal reflux disease)    History of kidney stones    passed   Hypertension    Hypothyroidism    Lumbar stenosis    OSA (obstructive sleep apnea)    wears cpap   Parkinson's disease (HCC)    PONV (postoperative nausea and vomiting)    REM behavioral disorder    Urinary incontinence    Past Surgical History:  Procedure Laterality Date   BACK SURGERY     x 2 - not fusion   BIOPSY  06/01/2022   Procedure: BIOPSY;  Surgeon: Baldo Bonds, MD;  Location: [facility];  Service: Gastroenterology;;   COLONOSCOPY     DEEP BRAIN STIMULATOR PLACEMENT     left 12/27/2012, right 12/10/2011, left revision 02/02/2013, replacement bilateral 11/21/2015   ESOPHAGOGASTRODUODENOSCOPY (EGD) WITH PROPOFOL  N/A 06/01/2022   Procedure: ESOPHAGOGASTRODUODENOSCOPY (EGD) WITH PROPOFOL ;  Surgeon: Baldo Bonds, MD;  Location: [facility];  Service: Gastroenterology;  Laterality: N/A;   MINOR PLACEMENT OF FIDUCIAL N/A 01/09/2019   Procedure: Fiducial placement;  Surgeon: Manya Sells, MD;  Location: [facility] OR;  Service: Neurosurgery;  Laterality: N/A;  Fiducial placement   PARTIAL NEPHRECTOMY Right 2005 ish   PULSE GENERATOR IMPLANT Right 01/23/2019   Procedure: Right chest implantable pulse generator change;  Surgeon: Manya Sells, MD;  Location: [facility] OR;  Service: Neurosurgery;  Laterality:  Right;   SPINAL FUSION     lumbar Fusion x 2   SUBTHALAMIC STIMULATOR BATTERY REPLACEMENT Left 02/02/2018   Procedure: Change implantable pulse generator battery, Left chest;  Surgeon: Manya Sells, MD;  Location: [facility] OR;  Service: Neurosurgery;  Laterality: Left;   SUBTHALAMIC STIMULATOR BATTERY REPLACEMENT N/A 01/22/2020   Procedure: REMOVAL OF LEFT BRAIN DEEP BRAIN STIMULATOR ELECTRODE, EXTENSION, AND PULSE GENERATOR;  Surgeon: Manya Sells, MD;  Location: [facility] OR;  Service: Neurosurgery;  Laterality: N/A;   SUBTHALAMIC STIMULATOR BATTERY REPLACEMENT Right 02/23/2022   Procedure: CHANGE IPG BATTERY, RT CHEST;  Surgeon: Dawley, Colby Daub, DO;  Location: [facility] OR;  Service: Neurosurgery;  Laterality: Right;   SUBTHALAMIC STIMULATOR INSERTION Left 01/16/2019   Procedure: Left deep brain stimulator electrode revision.;  Surgeon: Manya Sells, MD;  Location: [facility] OR;  Service: Neurosurgery;  Laterality: Left;   SUBTHALAMIC STIMULATOR INSERTION Left 01/23/2019   Procedure: Repositioning of Left Deep Brain Stimulator Electrode;  Surgeon: Manya Sells, MD;  Location: [facility] OR;  Service: Neurosurgery;  Laterality: Left;   TONSILLECTOMY     Patient Active Problem List   Diagnosis Date Noted   Melena 06/01/2022   Acute cystitis without hematuria 05/31/2022   Upper GI bleed 05/31/2022   Generalized weakness 05/30/2022   Abdominal pain 05/30/2022   OSA (obstructive sleep apnea) 05/30/2022   Hypothyroidism 05/30/2022   Orthostatic hypotension due to  Parkinson's disease (HCC) 12/14/2021   Mixed hyperlipidemia 12/14/2021   Essential hypertension 12/14/2021   Palpitations 12/14/2021   Angina pectoris (HCC) 12/14/2021   Urge incontinence 01/15/2021   History of UTI 01/15/2021   Abnormal urine findings 01/15/2021   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   History of nephrolithiasis 01/15/2021   DVT, lower extremity, proximal, acute, right (HCC) 02/26/2020   Open scalp wound 01/22/2020   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HCC)     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   Parkinson's disease (HCC) 01/16/2018   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 "My parkinson's is acting up".   Pt accompanied by: significant other; [name]  PERTINENT HISTORY: PD (hx of STN DBS), depression, sleep apnea  PAIN:  Are you having pain? Yes: NPRS scale: 0 Pain location:   Pain description: chronic Aggravating factors: sitting up Relieving factors: N/A  FALLS: Has patient fallen in last 6 months?  Yes  LIVING ENVIRONMENT: Lives with: lives with their family; 1 Lives in: House/apartment  PLOF:  Level of assistance: Independent with ADLs, Independent with IADLs Employment: Retired  PATIENT GOALS: voice, cognition  OBJECTIVE:  Note: Objective measures were completed at Evaluation unless otherwise noted.  DIAGNOSTIC FINDINGS: No imaging on this EMR since 2021 CT Head.   COGNITION: Overall cognitive status: Impaired Areas of impairment:  Attention: Impaired: Selective, Alternating, Divided Memory: Impaired: Short term Prospective Executive function: Impaired: Organization and Planning Functional deficits: Difficulty with recall of details  MOTOR SPEECH: Overall motor speech: impaired Level of impairment: Conversation Respiration: Reduced support Phonation: normal Resonance: WFL Articulation: Impaired: conversation Intelligibility: Intelligibility reduced Motor planning: Appears intact Motor speech errors: NA Interfering components: NA Effective technique: slow rate, increased vocal intensity, and over  articulate  ORAL MOTOR EXAMINATION: Overall status: WFL Comments:    CLINICAL SWALLOW ASSESSMENT:   Current diet: regular and thin liquids Dentition: adequate natural dentition Patient directly observed with POs: Yes: thin liquids  Feeding: able to feed self Liquids provided by: cup and straw Oral phase signs and symptoms: NA Pharyngeal phase signs and symptoms: multiple swallows Comments: [facility] Swallow Protocol PASS   *Pt reports some difficulty with drier foods and occasionally getting strangled with liquids. No observed this date. To monitor.   SOCIAL HISTORY: Occupation: Warden/ranger intake: suboptimal Caffeine/alcohol intake: minimal Daily voice use: minimal  PERCEPTUAL VOICE ASSESSMENT: Voice quality: normal Vocal abuse: NA Resonance: normal Respiratory function: thoracic breathing  LSVT-LOUD VOICE EVALUATION: Maximum phonation time for sustained "ah": 13 sec Mean intensity during sustained "ah": 80 dB  Mean fundamental frequency during sustained "ah": 111 Hz  Mean intensity sustained during conversational speech: 70 dB Habitual pitch: 110 Hz Highest dynamic pitch when altering pitch from a low note to a high note: 175 Hz Lowest dynamic pitch when altering from a high note to a low note: 106 Hz Highest dynamic pitch during conversational speech: 159 Hz Lowest dynamic pitch during conversational speech: 82 Hz Average time patient was able to sustain /s/: 4.8 seconds Average time patient was able to sustain /z/: 13.8 seconds s/z ratio : .34 (indicating suspected respiratory inefficiency)   Speech is characterized by Other: . For trained listener in quiet environment with  context, intelligibility was >90%.    Patient able to improve all parameters with model ("Loud like me") Stimulability: Improved vocal quality with loud voice (81 dB) for sustained vowel, functional phrases averaged 78 dB and improved pitch range with model.     The Communication Effectiveness Survey  is a patient-reported outcome measure in which the patient rates their own effectiveness in different communication situations. A higher score indicates greater effectiveness. Pt's self-rating was 27/32. Patient reported mild difficulty conversing with a telephone, conversing in a noisy environment, speaking when emotional, conversing when traveling in a car, having conversation with someone at a distance.   STANDARDIZED ASSESSMENTS: SLUMS: 24/30  PATIENT REPORTED OUTCOME MEASURES (PROM): Cognitive function: Short Form: 34 and Communication Effectiveness Survey: 27                                                                                                                            TREATMENT DATE:   08/02/23: Pt was seen for skilled ST services targeting dysarthria. Pt reports he has completed his EMST today. SLP led pt through warm up exercises re: diaphragmatic breathing; may, me, my, mo, moo; sustained phonation, and pitch glides. SLP instructed pt to project voice ("think loud"; "speak over the fence") during warm up. Pt read instructions for SPEAK OUT and performed exercises with minA. He required occasional verbal cues to continue with "intent" for entirety of reading exercise vs trailing off. Pt participated in structured conversation with consistent cues for increasing loudness.   07/28/23: Pt was seen for skilled ST services targeting education for wife and design of templates for EMST, SPEAK OUT, and Medication log due to pt having difficulty remembering to complete exercises. SLP encouraged wife to help patient set up a "memory and exercise station" with calendar, medication log, and exercise log. Pt completed reading and cognitive portion of SPEAK OUT while SLP designed templates. Provided in clear sheet protector at end of session. Pt to bring EMST next session.   07/21/23: Pt was seen for skilled ST services targeting review of SPEAK OUT. Pt reports he did not complete any voice  exercises due to company this week. SLP explained the importance of consistency with exercises for demonstrated improvement. He has not completed his EMST exercises in over a year. SLP reassessed on his personal device. Pt encouraged to set a routine to complete voice and respiratory exercises.    EMST 150 3X5 reps @ 75% of MEP - 30 cmH20 with 15 second rest  (45 cmH20 MEP)   07/12/23: Pt was seen for skilled ST services targeting review of SPEAK OUT. SLP educated on "Living with Intent Means To..." found in his HEP book. Patient reported that "speaking with conscious effort" resonated the most with him and his idea of intent. SLP led patient through vocal warm up re: May-Me-My-Moe-Mo x5, sustained phonation /ah/ x5, pitch glides x5/each. Pt completed cognitive component of SPEAK OUT book independently (up and ___). Pt demonstrated completion of SPEAK  OUT  exercises with spv. SLP set pt up with a system to separate his completion from previous therapy course (using pencil and X marks vs. Pen and Check Mark). Pt completed Lesson 1 with therapist for AM portion - to complete PM portion at home this evening.   07/08/23: Pt was seen for skilled ST services targeting continued voice assessment - see above. Pt also completed PROMs for cognitive function and dysarthria. Pt brought in his "Speak Out" book from previous therapist. SLP encouraged pt to continue with exercises. SLP is not "speak out" certified, but plans to use similar cues with exemplars from book for continuity of care.  PATIENT EDUCATION: Education details: PD and SLP role  Person educated: Patient and Spouse Education method: Explanation Education comprehension: verbalized understanding   GOALS: Goals reviewed with patient? Yes  SHORT TERM GOALS: Target date: 08/07/23  Pt will complete baseline voice measures Baseline: Goal status:MET  2.  Pt will complete PROMs Baseline:  Goal status: MET  3.  Pt will demonstrate voice HEP  independently Baseline:  Goal status: INITIAL  4.  Pt/wife will verbalize 3 memory strategies to improve recall of important information Baseline:  Goal status: INITIAL  5.   Baseline:  Goal status: INITIAL  6.   Baseline:  Goal status: INITIAL  LONG TERM GOALS: Target date: 09/06/23  Pt will improve score on PROMs Baseline:  Goal status: INITIAL  2.  Pt will demonstrate improved vocal intensity with sustained phonation Baseline:  Goal status: INITIAL  3.  Pt will demonstrate improved vocal intensity in 5 min, unstructured conversation Baseline:  Goal status: INITIAL  4.  Pt/wife will report success using memory strategies at home Baseline:  Goal status: INITIAL  5.   Baseline:  Goal status: INITIAL  6.   Baseline:  Goal status: INITIAL  ASSESSMENT:  CLINICAL IMPRESSION: Pt is a 78 yo male who presents to ST OP for evaluation for sx 2/2 Parkinson's Disease.  Pt endorses changes in vocal intensity, breath support, and cognition re: focus, memory. Pt has received speech therapy before at [facility] ~2 years ago. Wife reports he has been referred for a neuropsych evaluation for cognition; therefore, SLP to just complete a screen at this time. Pt was assessed using SLUMS - scoring a 24/30. Impairments noted in recall and word generation. Pt with complaints of occasional difficulty with thin liquids and solids. Pt passed YSP, with no overt s/sx of aspiration. Pt reports he has some difficulty with drier solids getting "stuck". SLP suggested moister meats re: chicken thighs vs chicken breast, cutting items into smaller pieces, and adding sauces/gravies to drier foods. Unable to complete voice measures due to time constraints. To complete next session. SLP rec skilled ST services to address cognitive-communication impairment, dysarthria, and monitor dysphagia.    OBJECTIVE IMPAIRMENTS: include attention, memory, executive functioning, and dysarthria. These impairments  are limiting patient from effectively communicating at home and in community. Factors affecting potential to achieve goals and functional outcome are NA. Patient will benefit from skilled SLP services to address above impairments and improve overall function.  REHAB POTENTIAL: Good  PLAN:  SLP FREQUENCY: 1-2x/week  SLP DURATION: 8 weeks  PLANNED INTERVENTIONS: Diet toleration management , Cueing hierachy, Internal/external aids, Functional tasks, Multimodal communication approach, SLP instruction and feedback, Compensatory strategies, Patient/family education, and [address] Treatment of speech (30 or 45 min)     [employer], CCC-SLP 08/02/2023, 12:39 PM

## 2023-08-02 NOTE — Therapy (Signed)
 OUTPATIENT PHYSICAL THERAPY NEURO TREATMENT   Patient Name: Patrick Ross MRN: [id number] DOB:Jul 07, 1945, 78 y.o., male Today's Date: 08/02/2023   PCP: Jayne Mews MD REFERRING PROVIDER: Shirline Dover DO  END OF SESSION:  PT End of Session - 08/02/23 1304     Visit Number 4    Authorization Type Medicare    PT Start Time 1315    PT Stop Time 1400    PT Time Calculation (min) 45 min                Past Medical History:  Diagnosis Date   Depression    DVT (deep venous thrombosis) (HCC)    GERD (gastroesophageal reflux disease)    History of kidney stones    passed   Hypertension    Hypothyroidism    Lumbar stenosis    OSA (obstructive sleep apnea)    wears cpap   Parkinson's disease (HCC)    PONV (postoperative nausea and vomiting)    REM behavioral disorder    Urinary incontinence    Past Surgical History:  Procedure Laterality Date   BACK SURGERY     x 2 - not fusion   BIOPSY  06/01/2022   Procedure: BIOPSY;  Surgeon: Baldo Bonds, MD;  Location: [facility];  Service: Gastroenterology;;   COLONOSCOPY     DEEP BRAIN STIMULATOR PLACEMENT     left 12/27/2012, right 12/10/2011, left revision 02/02/2013, replacement bilateral 11/21/2015   ESOPHAGOGASTRODUODENOSCOPY (EGD) WITH PROPOFOL  N/A 06/01/2022   Procedure: ESOPHAGOGASTRODUODENOSCOPY (EGD) WITH PROPOFOL ;  Surgeon: Baldo Bonds, MD;  Location: [facility];  Service: Gastroenterology;  Laterality: N/A;   MINOR PLACEMENT OF FIDUCIAL N/A 01/09/2019   Procedure: Fiducial placement;  Surgeon: Manya Sells, MD;  Location: [facility] OR;  Service: Neurosurgery;  Laterality: N/A;  Fiducial placement   PARTIAL NEPHRECTOMY Right 2005 ish   PULSE GENERATOR IMPLANT Right 01/23/2019   Procedure: Right chest implantable pulse generator change;  Surgeon: Manya Sells, MD;  Location: [facility] OR;  Service: Neurosurgery;  Laterality: Right;   SPINAL FUSION     lumbar Fusion x 2   SUBTHALAMIC STIMULATOR BATTERY  REPLACEMENT Left 02/02/2018   Procedure: Change implantable pulse generator battery, Left chest;  Surgeon: Manya Sells, MD;  Location: [facility] OR;  Service: Neurosurgery;  Laterality: Left;   SUBTHALAMIC STIMULATOR BATTERY REPLACEMENT N/A 01/22/2020   Procedure: REMOVAL OF LEFT BRAIN DEEP BRAIN STIMULATOR ELECTRODE, EXTENSION, AND PULSE GENERATOR;  Surgeon: Manya Sells, MD;  Location: [facility] OR;  Service: Neurosurgery;  Laterality: N/A;   SUBTHALAMIC STIMULATOR BATTERY REPLACEMENT Right 02/23/2022   Procedure: CHANGE IPG BATTERY, RT CHEST;  Surgeon: Dawley, Colby Daub, DO;  Location: [facility] OR;  Service: Neurosurgery;  Laterality: Right;   SUBTHALAMIC STIMULATOR INSERTION Left 01/16/2019   Procedure: Left deep brain stimulator electrode revision.;  Surgeon: Manya Sells, MD;  Location: [facility] OR;  Service: Neurosurgery;  Laterality: Left;   SUBTHALAMIC STIMULATOR INSERTION Left 01/23/2019   Procedure: Repositioning of Left Deep Brain Stimulator Electrode;  Surgeon: Manya Sells, MD;  Location: [facility] OR;  Service: Neurosurgery;  Laterality: Left;   TONSILLECTOMY     Patient Active Problem List   Diagnosis Date Noted   Melena 06/01/2022   Acute cystitis without hematuria 05/31/2022   Upper GI bleed 05/31/2022   Generalized weakness 05/30/2022   Abdominal pain 05/30/2022   OSA (obstructive sleep apnea) 05/30/2022   Hypothyroidism 05/30/2022   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) 12/14/2021   Mixed hyperlipidemia 12/14/2021   Essential hypertension 12/14/2021  Palpitations 12/14/2021   Angina pectoris (HCC) 12/14/2021   Urge incontinence 01/15/2021   History of UTI 01/15/2021   Abnormal urine findings 01/15/2021   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   History of nephrolithiasis 01/15/2021   DVT, lower extremity, proximal, acute, right (HCC) 02/26/2020   Open scalp wound 01/22/2020   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HCC)    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   Parkinson's disease (HCC)  01/16/2018   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) 01/16/2018   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    ONSET DATE: exacerbation 06/21/23, Parkinson's diagnosis 2010  REFERRING DIAG: parkinsons disease with fluctuating dyskinesia  THERAPY DIAG:  Difficulty in walking, not elsewhere classified  Other lack of coordination  Parkinson's disease with dyskinesia, with fluctuations (HCC)  Rationale for Evaluation and Treatment: Rehabilitation  SUBJECTIVE:                                                                                                                                                                                             SUBJECTIVE STATEMENT: Not real well, kind of dizzy today, my parkinson's is acting up today. I can feel it my R leg and my toes are curling up.    EVAL-Pt and his wife report long history of parkinson's,  biggest difficulty with his mobility that they note is R lower back  pain and labored supine to sit movement, also poor foot clearance.  He doesn't like relying on the walker Pt accompanied by: significant other  PERTINENT HISTORY: parkinson's diagnosed greater than 10 yrs ago, deep brain stimulator R, had in L as well but had to be removed for various reasons.  walks around neighborhood 3 x week with aide Tuesdays and Thursdays parkisons bicycle class 2 x Lynelle Sara training for upper body work out Referred to PT and ST by neurologist   PAIN:  Are you having pain? R lower back pain constant  PRECAUTIONS: Fall and Other: deep brain stimulator on R   RED FLAGS: None   WEIGHT BEARING RESTRICTIONS: No  FALLS: Has patient fallen in last 6 months? No  LIVING ENVIRONMENT: Lives with: lives with their spouse Lives in: House/apartment Stairs: has internal steps, but has had home renovated and adapted so the he and his wife live in downstairs apartment, all one level with bathroom and kitchen Has following equipment at home: Single point cane, Environmental consultant - 4  wheeled, Tour manager, Grab bars, and Ramped entry  PLOF: Independent  PATIENT GOALS: improve bed mobility and improve gait, stability, avoid falls  OBJECTIVE:  Note: Objective measures were completed at Evaluation unless otherwise noted.  DIAGNOSTIC FINDINGS: see neuro notes, has deep  brain stimulator R   COGNITION: Overall cognitive status: Within functional limits for tasks assessed   SENSATION: WFL  COORDINATION: Slowed movements B and noted "curling" R hand and R foot at times, with gait festinating on turns   EDEMA:  None noted  MUSCLE TONE: noted curling R fingers, toes, knee flex and hip flex at times, randomly during evaluation  MUSCLE LENGTH: Hamstrings: Right -30 deg; Left -30 deg Thomas test: Right nt deg; Left nt deg    POSTURE: Standing with some forward lean, weight on forefeet, L calf atrophied pt and wife report chronic after L lumbar surgery.     LOWER EXTREMITY ROM:   grossly wfl, hips not assessed as not positioned on mat today   LOWER EXTREMITY MMT:    MMT Right Eval Left Eval  Hip flexion 4 4-  Hip extension    Hip abduction    Hip adduction    Hip internal rotation    Hip external rotation    Knee flexion 4 4  Knee extension 4 4-  Ankle dorsiflexion 4 3+  Ankle plantarflexion 4- 3-  Ankle inversion    Ankle eversion    (Blank rows = not tested)  BED MOBILITY:  Not tested  TRANSFERS: I but slowed, uses hands on seat or with rollator  GAIT: Findings: Gait Characteristics: step to pattern, decreased step length- Right, decreased step length- Left, decreased stride length, decreased hip/knee flexion- Right, decreased hip/knee flexion- Left, shuffling, festinating, trunk flexed, and narrow BOS, Distance walked: 100', Assistive device utilized:Walker - 4 wheeled, Level of assistance: SBA, and Comments: festinating noted on turns, tends to turn L  FUNCTIONAL TESTS:  30 seconds chair stand test 6 reps Timed up and go (TUG): 33.39 with  rollator Standing static feet together 15 sec Standing semi tandem 10 sec each leg, needed CGA with L foot forward Standing marching, required B hand hold support to maintain balance                                                                                                                              TREATMENT DATE: 08/02/23 NuStep L5x50mins  Side steps over obstacles in bars Forwards steps over obstacles in bars Calf raises x10 Box taps 4" 1HRA Step ups 4" 2HRA  STS with OHP 2x5  07/28/23 Supine HS stretch and SKTC  Lower trunk rotations x10 SLR 2x10 Feet on pball rotations and knees to chest AB isometric holds x10  NuStep L5x68mins  STS 3x5   07/27/23 NuStep L5x16mins  LAQ 2# 2x10 HS curls green 2x10  Hip abduction green 2x10 STS 2x10  Stretching with pball rotations and flexion x5     07/06/23 Evaluation, advised to attempt prone lying on bed 10 to 15 min  to stretch body into extension( pt doesn't ever lie prone)   PATIENT EDUCATION: Education details: POC, goals Person educated: Patient and Spouse Education method: Explanation, Demonstration, Tactile cues, and Verbal cues Education  comprehension: verbalized understanding, verbal cues required, and needs further education  HOME EXERCISE PROGRAM: Access Code: [email or web address] URL: [email or web address] Date: 08/02/2023 Prepared by: Donavon Fudge  Exercises - Supine Hamstring Stretch with Strap  - 2 x daily - 7 x weekly - 2 reps - 15 hold - Modified Thomas Stretch  - 2 x daily - 7 x weekly - 2 reps - 15 hold - Supine Bridge  - 1 x daily - 7 x weekly - 2 sets - 10 reps - Standing Shoulder Horizontal Abduction with Resistance  - 1 x daily - 7 x weekly - 2 sets - 10 reps  GOALS: Goals reviewed with patient? Yes  SHORT TERM GOALS: Target date: 4 weeks, 08/03/23  I home program for posture and for hamstring, hip flexor stretchig Baseline: Goal status: ongoing 08/02/23  LONG TERM GOALS: Target date: 09/28/23:  12 weeks   30 sec sit to stand improve from 6 reps to 12 reps for improved coordination, strength, endurance LEs Baseline:  Goal status: INITIAL  2.  I and pain free supine to sit motion , rolling to R Baseline: very painful per pt and wife history, not specifically assessed in clinic today Goal status: ongoing 07/28/23  3.  TUG score improve from 33 sec to 18 or less for reduced fall risk   Baseline:  Goal status: INITIAL  4.  Improve strength LE's for B quads, hamstrings to 4+5 for improved functional strength Baseline: 4- Goal status: INITIAL   ASSESSMENT:  CLINICAL IMPRESSION: Patient returns with some ongoing pain in his back. We worked on taking big steps using targets to step over. He is hesitant and has trouble initiating steps without the walker but once he gets going he is able to step over with minimal LOB. Pt has difficulty with small turns, he often shuffles his feet. He has minimal heel lift with calf raises. Will continue to progress to improve his overall function. HEP given.   EVAL Patient is a 78 y.o. male who was evaluated today by physical therapy due to referral from his neurologist, with decline in function, particularly control of R extremities, with his Parkinson's diagnosis.  He and his wife have been proactive about maintaining his function, he attends a training/ strengthening session locally 2 x week , attends parkinson's cycling weekly, and walks around 0.6 miles with his aide 3 x week outdoors with his rollator.  They recently have adapted their home with a main floor renovation so that their bedroom, bath, kitchen are all on one level.   Main problems identified today are weakness B LE's, L ankle is weak chronically due to lumbar radiculopathy and B knee, hips musculature is weak.  He also has coordination deficits with festinating gait particularly on turns and static / dynamic balance deficits.  Has chronic R sided lower back pain which is particularly  intense with moving supine to sit.  He should benefit from physical therapy intervention to address his deficits and update his current routine for stretching and strengthening at home.   OBJECTIVE IMPAIRMENTS: Abnormal gait, decreased activity tolerance, decreased balance, decreased coordination, decreased endurance, decreased mobility, difficulty walking, decreased strength, hypomobility, impaired perceived functional ability, impaired UE functional use, postural dysfunction, and pain.   ACTIVITY LIMITATIONS: carrying, lifting, squatting, stairs, transfers, and locomotion level  PARTICIPATION LIMITATIONS: meal prep, cleaning, laundry, shopping, community activity, and church  PERSONAL FACTORS: Age, Behavior pattern, Fitness, Past/current experiences, Time since onset of injury/illness/exacerbation, and 1-2 comorbidities: h/o L lumbar radiculopathy and weakness  L LE, chronic R sided lower back pain, are also affecting patient's functional outcome.   REHAB POTENTIAL: Good  CLINICAL DECISION MAKING: Evolving/moderate complexity  EVALUATION COMPLEXITY: Moderate  PLAN:  PT FREQUENCY: 2x/week  PT DURATION: 12 weeks  PLANNED INTERVENTIONS: 97110-Therapeutic exercises, 97530- Therapeutic activity, [id number]- Neuromuscular re-education, 97535- Self Care, and 16109- Manual therapy  PLAN FOR NEXT SESSION: recheck goals    Donavon Fudge, PT, DPT 08/02/2023, 1:56 PM

## 2023-08-04 ENCOUNTER — Ambulatory Visit

## 2023-08-04 ENCOUNTER — Ambulatory Visit: Admitting: Speech Pathology

## 2023-08-04 DIAGNOSIS — R278 Other lack of coordination: Secondary | ICD-10-CM

## 2023-08-04 DIAGNOSIS — R471 Dysarthria and anarthria: Secondary | ICD-10-CM

## 2023-08-04 DIAGNOSIS — R262 Difficulty in walking, not elsewhere classified: Secondary | ICD-10-CM | POA: Diagnosis not present

## 2023-08-04 DIAGNOSIS — G20B2 Parkinson's disease with dyskinesia, with fluctuations: Secondary | ICD-10-CM

## 2023-08-04 NOTE — Therapy (Signed)
 OUTPATIENT PHYSICAL THERAPY NEURO TREATMENT   Patient Name: Patrick Ross MRN: [id number] DOB:[birth year], 78 y.o., male Today's Date: 08/04/2023   PCP: Patrick Mews MD REFERRING PROVIDER: Shirline Dover DO  END OF SESSION:  PT End of Session - 08/04/23 1312     Visit Number 5    Authorization Type Medicare    PT Start Time 1312    PT Stop Time 1355    PT Time Calculation (min) 43 min                 Past Medical History:  Diagnosis Date   Depression    DVT (deep venous thrombosis) (HCC)    GERD (gastroesophageal reflux disease)    History of kidney stones    passed   Hypertension    Hypothyroidism    Lumbar stenosis    OSA (obstructive sleep apnea)    wears cpap   Parkinson's disease (HCC)    PONV (postoperative nausea and vomiting)    REM behavioral disorder    Urinary incontinence    Past Surgical History:  Procedure Laterality Date   BACK SURGERY     x 2 - not fusion   BIOPSY  06/01/2022   Procedure: BIOPSY;  Surgeon: Patrick Bonds, MD;  Location: [facility];  Service: Gastroenterology;;   COLONOSCOPY     DEEP BRAIN STIMULATOR PLACEMENT     left 12/27/2012, right 12/10/2011, left revision 02/02/2013, replacement bilateral 11/21/2015   ESOPHAGOGASTRODUODENOSCOPY (EGD) WITH PROPOFOL  N/A 06/01/2022   Procedure: ESOPHAGOGASTRODUODENOSCOPY (EGD) WITH PROPOFOL ;  Surgeon: Patrick Bonds, MD;  Location: [facility];  Service: Gastroenterology;  Laterality: N/A;   MINOR PLACEMENT OF FIDUCIAL N/A 01/09/2019   Procedure: Fiducial placement;  Surgeon: Patrick Sells, MD;  Location: [facility] OR;  Service: Neurosurgery;  Laterality: N/A;  Fiducial placement   PARTIAL NEPHRECTOMY Right 2005 ish   PULSE GENERATOR IMPLANT Right 01/23/2019   Procedure: Right chest implantable pulse generator change;  Surgeon: Patrick Sells, MD;  Location: [facility] OR;  Service: Neurosurgery;  Laterality: Right;   SPINAL FUSION     lumbar Fusion x 2   SUBTHALAMIC STIMULATOR BATTERY  REPLACEMENT Left 02/02/2018   Procedure: Change implantable pulse generator battery, Left chest;  Surgeon: Patrick Sells, MD;  Location: [facility] OR;  Service: Neurosurgery;  Laterality: Left;   SUBTHALAMIC STIMULATOR BATTERY REPLACEMENT N/A 01/22/2020   Procedure: REMOVAL OF LEFT BRAIN DEEP BRAIN STIMULATOR ELECTRODE, EXTENSION, AND PULSE GENERATOR;  Surgeon: Patrick Sells, MD;  Location: [facility] OR;  Service: Neurosurgery;  Laterality: N/A;   SUBTHALAMIC STIMULATOR BATTERY REPLACEMENT Right 02/23/2022   Procedure: CHANGE IPG BATTERY, RT CHEST;  Surgeon: Patrick Ross, Patrick Daub, DO;  Location: [facility] OR;  Service: Neurosurgery;  Laterality: Right;   SUBTHALAMIC STIMULATOR INSERTION Left 01/16/2019   Procedure: Left deep brain stimulator electrode revision.;  Surgeon: Patrick Sells, MD;  Location: [facility] OR;  Service: Neurosurgery;  Laterality: Left;   SUBTHALAMIC STIMULATOR INSERTION Left 01/23/2019   Procedure: Repositioning of Left Deep Brain Stimulator Electrode;  Surgeon: Patrick Sells, MD;  Location: [facility] OR;  Service: Neurosurgery;  Laterality: Left;   TONSILLECTOMY     Patient Active Problem List   Diagnosis Date Noted   Melena 06/01/2022   Acute cystitis without hematuria 05/31/2022   Upper GI bleed 05/31/2022   Generalized weakness 05/30/2022   Abdominal pain 05/30/2022   OSA (obstructive sleep apnea) 05/30/2022   Hypothyroidism 05/30/2022   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) 12/14/2021   Mixed hyperlipidemia 12/14/2021   Essential hypertension  12/14/2021   Palpitations 12/14/2021   Angina pectoris (HCC) 12/14/2021   Urge incontinence 01/15/2021   History of UTI 01/15/2021   Abnormal urine findings 01/15/2021   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   History of nephrolithiasis 01/15/2021   DVT, lower extremity, proximal, acute, right (HCC) 02/26/2020   Open scalp wound 01/22/2020   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HCC)    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   Parkinson's disease (HCC)  01/16/2018   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) 01/16/2018   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 Nothing new.    EVAL-Pt and his wife report long history of parkinson's,  biggest difficulty with his mobility that they note is R lower back  pain and labored supine to sit movement, also poor foot clearance.  He doesn't like relying on the walker Pt accompanied by: significant other  PERTINENT HISTORY: parkinson's diagnosed greater than 10 yrs ago, deep brain stimulator R, had in L as well but had to be removed for various reasons.  walks around neighborhood 3 x week with aide Tuesdays and Thursdays parkisons bicycle class 2 x Lynelle Sara training for upper body work out Referred to PT and ST by neurologist   PAIN:  Are you having pain? R lower back pain constant  PRECAUTIONS: Fall and Other: deep brain stimulator on R   RED FLAGS: None   WEIGHT BEARING RESTRICTIONS: No  FALLS: Has patient fallen in last 6 months? No  LIVING ENVIRONMENT: Lives with: lives with their spouse Lives in: House/apartment Stairs: has internal steps, but has had home renovated and adapted so the he and his wife live in downstairs apartment, all one level with bathroom and kitchen Has following equipment at home: Single point cane, Environmental consultant - 4 wheeled, Tour manager, Grab bars, and Ramped entry  PLOF: Independent  PATIENT GOALS:  improve bed mobility and improve gait, stability, avoid falls  OBJECTIVE:  Note: Objective measures were completed at Evaluation unless otherwise noted.  DIAGNOSTIC FINDINGS: see neuro notes, has deep brain stimulator R   COGNITION: Overall cognitive status: Within functional limits for tasks assessed  SENSATION: WFL  COORDINATION: Slowed movements B and noted "curling" R hand and R foot at times, with gait festinating on turns   EDEMA:  None noted  MUSCLE TONE: noted curling R fingers, toes, knee flex and hip flex at times, randomly during evaluation  MUSCLE LENGTH: Hamstrings: Right -30 deg; Left -30 deg Thomas test: Right nt deg; Left nt deg    POSTURE: Standing with some forward lean, weight on forefeet, L calf atrophied pt and wife report chronic after L lumbar surgery.     LOWER EXTREMITY ROM:   grossly wfl, hips not assessed as not positioned on mat today   LOWER EXTREMITY MMT:    MMT Right Eval Left Eval  Hip flexion 4 4-  Hip extension    Hip abduction    Hip adduction    Hip internal rotation    Hip external rotation    Knee flexion 4 4  Knee extension 4 4-  Ankle dorsiflexion 4 3+  Ankle plantarflexion 4- 3-  Ankle inversion    Ankle eversion    (Blank rows = not tested)  BED MOBILITY:  Not tested  TRANSFERS: I but slowed, uses hands on seat or with rollator  GAIT: Findings: Gait Characteristics: step to pattern, decreased step length- Right, decreased step length- Left, decreased stride length, decreased hip/knee flexion- Right, decreased hip/knee flexion- Left, shuffling, festinating, trunk flexed, and narrow BOS, Distance walked: 100', Assistive device utilized:Walker - 4 wheeled, Level of assistance: SBA, and Comments: festinating noted on turns, tends to turn L  FUNCTIONAL TESTS:  30 seconds chair stand test 6 reps Timed up and go (TUG): 33.39 with rollator Standing static feet together 15 sec Standing semi tandem 10 sec each leg,  needed CGA with L foot forward Standing marching, required B hand hold support to maintain balance                                                                                                                              TREATMENT DATE: 08/04/23 30s chair test TUG 27s  MMT 4/5 Standing shoulder ext and rows green band 2x10 Standing shoulder flexion 2# WaTE x10 - stopped due to feeling dizzy and off balance  NuStep L5 x84mins  Seated rotation with yellow ball 2x10   08/02/23 NuStep L5x50mins  Side steps over obstacles in bars Forwards steps over obstacles in bars Calf raises x10 Box taps 4" 1HRA Step ups 4" 2HRA  STS with OHP 2x5  07/28/23 Supine HS stretch and SKTC  Lower trunk rotations x10 SLR 2x10 Feet on pball rotations and knees to chest AB isometric holds x10  NuStep L5x76mins  STS 3x5   07/27/23 NuStep L5x91mins  LAQ 2# 2x10 HS curls green 2x10  Hip abduction green 2x10 STS 2x10  Stretching with pball rotations and flexion x5     07/06/23 Evaluation, advised to attempt prone lying on bed 10 to 15 min  to stretch body into extension(  pt doesn't ever lie prone)   PATIENT EDUCATION: Education details: POC, goals Person educated: Patient and Spouse Education method: Explanation, Demonstration, Tactile cues, and Verbal cues Education comprehension: verbalized understanding, verbal cues required, and needs further education  HOME EXERCISE PROGRAM: Access Code: [email or web address] URL: [email or web address] Date: 08/02/2023 Prepared by: Donavon Fudge  Exercises - Supine Hamstring Stretch with Strap  - 2 x daily - 7 x weekly - 2 reps - 15 hold - Modified Thomas Stretch  - 2 x daily - 7 x weekly - 2 reps - 15 hold - Supine Bridge  - 1 x daily - 7 x weekly - 2 sets - 10 reps - Standing Shoulder Horizontal Abduction with Resistance  - 1 x daily - 7 x weekly - 2 sets - 10 reps  GOALS: Goals reviewed with patient? Yes  SHORT TERM GOALS: Target date: 4 weeks,  08/03/23  I home program for posture and for hamstring, hip flexor stretchig Baseline: Goal status: ongoing 08/02/23  LONG TERM GOALS: Target date: 09/28/23: 12 weeks   30 sec sit to stand improve from 6 reps to 12 reps for improved coordination, strength, endurance LEs Baseline:  Goal status: 7 reps ongoing 08/04/23  2.  I and pain free supine to sit motion , rolling to R Baseline: very painful per pt and wife history, not specifically assessed in clinic today Goal status: ongoing 07/28/23  3.  TUG score improve from 33 sec to 18 or less for reduced fall risk   Baseline:  Goal status: 27s ongoing 08/04/23  4.  Improve strength LE's for B quads, hamstrings to 4+5 for improved functional strength Baseline: 4- Goal status: 4/5 08/04/23   ASSESSMENT:  CLINICAL IMPRESSION: Patient returns with no reports of anything new. He has made some progress with LTG's. TUG has improved by 6s. STS are still difficult. He has some LOB backwards with standing exercises using band. With standing shoulder flexion he became dizzy and unsteady so we sat and took a break. Will continue to progress to improve his overall function. HEP given.   EVAL Patient is a 78 y.o. male who was evaluated today by physical therapy due to referral from his neurologist, with decline in function, particularly control of R extremities, with his Parkinson's diagnosis.  He and his wife have been proactive about maintaining his function, he attends a training/ strengthening session locally 2 x week , attends parkinson's cycling weekly, and walks around 0.6 miles with his aide 3 x week outdoors with his rollator.  They recently have adapted their home with a main floor renovation so that their bedroom, bath, kitchen are all on one level.   Main problems identified today are weakness B LE's, L ankle is weak chronically due to lumbar radiculopathy and B knee, hips musculature is weak.  He also has coordination deficits with festinating gait  particularly on turns and static / dynamic balance deficits.  Has chronic R sided lower back pain which is particularly intense with moving supine to sit.  He should benefit from physical therapy intervention to address his deficits and update his current routine for stretching and strengthening at home.    OBJECTIVE IMPAIRMENTS: Abnormal gait, decreased activity tolerance, decreased balance, decreased coordination, decreased endurance, decreased mobility, difficulty walking, decreased strength, hypomobility, impaired perceived functional ability, impaired UE functional use, postural dysfunction, and pain.   ACTIVITY LIMITATIONS: carrying, lifting, squatting, stairs, transfers, and locomotion level  PARTICIPATION LIMITATIONS: meal prep, cleaning, laundry, shopping, community activity, and church  PERSONAL  FACTORS: Age, Behavior pattern, Fitness, Past/current experiences, Time since onset of injury/illness/exacerbation, and 1-2 comorbidities: h/o L lumbar radiculopathy and weakness L LE, chronic R sided lower back pain, are also affecting patient's functional outcome.   REHAB POTENTIAL: Good  CLINICAL DECISION MAKING: Evolving/moderate complexity  EVALUATION COMPLEXITY: Moderate  PLAN:  PT FREQUENCY: 2x/week  PT DURATION: 12 weeks  PLANNED INTERVENTIONS: 97110-Therapeutic exercises, 97530- Therapeutic activity, [id number]- Neuromuscular re-education, 97535- Self Care, and [id number]- Manual therapy  PLAN FOR NEXT SESSION: recheck goals    Donavon Fudge, PT, DPT 08/04/2023, 1:52 PM

## 2023-08-05 ENCOUNTER — Encounter: Payer: Self-pay | Admitting: Speech Pathology

## 2023-08-05 NOTE — Therapy (Signed)
 OUTPATIENT SPEECH LANGUAGE PATHOLOGY TREATMENT   Patient Name: Patrick Ross MRN: [id number] DOB:1945/06/08, 78 y.o., male Today's Date: 08/05/2023  PCP: Jayne Mews, MD REFERRING PROVIDER: Shirline Dover, DO  END OF SESSION:  End of Session - 08/05/23 1153     Visit Number 7    Number of Visits 17    Date for SLP Re-Evaluation 09/06/23    SLP Start Time 1230    SLP Stop Time  1310    SLP Time Calculation (min) 40 min    Activity Tolerance Patient tolerated treatment well             Past Medical History:  Diagnosis Date   Depression    DVT (deep venous thrombosis) (HCC)    GERD (gastroesophageal reflux disease)    History of kidney stones    passed   Hypertension    Hypothyroidism    Lumbar stenosis    OSA (obstructive sleep apnea)    wears cpap   Parkinson's disease (HCC)    PONV (postoperative nausea and vomiting)    REM behavioral disorder    Urinary incontinence    Past Surgical History:  Procedure Laterality Date   BACK SURGERY     x 2 - not fusion   BIOPSY  06/01/2022   Procedure: BIOPSY;  Surgeon: Baldo Bonds, MD;  Location: [facility];  Service: Gastroenterology;;   COLONOSCOPY     DEEP BRAIN STIMULATOR PLACEMENT     left 12/27/2012, right 12/10/2011, left revision 02/02/2013, replacement bilateral 11/21/2015   ESOPHAGOGASTRODUODENOSCOPY (EGD) WITH PROPOFOL  N/A 06/01/2022   Procedure: ESOPHAGOGASTRODUODENOSCOPY (EGD) WITH PROPOFOL ;  Surgeon: Baldo Bonds, MD;  Location: [facility];  Service: Gastroenterology;  Laterality: N/A;   MINOR PLACEMENT OF FIDUCIAL N/A 01/09/2019   Procedure: Fiducial placement;  Surgeon: Manya Sells, MD;  Location: [facility] OR;  Service: Neurosurgery;  Laterality: N/A;  Fiducial placement   PARTIAL NEPHRECTOMY Right 2005 ish   PULSE GENERATOR IMPLANT Right 01/23/2019   Procedure: Right chest implantable pulse generator change;  Surgeon: Manya Sells, MD;  Location: [facility] OR;  Service: Neurosurgery;  Laterality:  Right;   SPINAL FUSION     lumbar Fusion x 2   SUBTHALAMIC STIMULATOR BATTERY REPLACEMENT Left 02/02/2018   Procedure: Change implantable pulse generator battery, Left chest;  Surgeon: Manya Sells, MD;  Location: [facility] OR;  Service: Neurosurgery;  Laterality: Left;   SUBTHALAMIC STIMULATOR BATTERY REPLACEMENT N/A 01/22/2020   Procedure: REMOVAL OF LEFT BRAIN DEEP BRAIN STIMULATOR ELECTRODE, EXTENSION, AND PULSE GENERATOR;  Surgeon: Manya Sells, MD;  Location: [facility] OR;  Service: Neurosurgery;  Laterality: N/A;   SUBTHALAMIC STIMULATOR BATTERY REPLACEMENT Right 02/23/2022   Procedure: CHANGE IPG BATTERY, RT CHEST;  Surgeon: Dawley, Colby Daub, DO;  Location: [facility] OR;  Service: Neurosurgery;  Laterality: Right;   SUBTHALAMIC STIMULATOR INSERTION Left 01/16/2019   Procedure: Left deep brain stimulator electrode revision.;  Surgeon: Manya Sells, MD;  Location: [facility] OR;  Service: Neurosurgery;  Laterality: Left;   SUBTHALAMIC STIMULATOR INSERTION Left 01/23/2019   Procedure: Repositioning of Left Deep Brain Stimulator Electrode;  Surgeon: Manya Sells, MD;  Location: [facility] OR;  Service: Neurosurgery;  Laterality: Left;   TONSILLECTOMY     Patient Active Problem List   Diagnosis Date Noted   Melena 06/01/2022   Acute cystitis without hematuria 05/31/2022   Upper GI bleed 05/31/2022   Generalized weakness 05/30/2022   Abdominal pain 05/30/2022   OSA (obstructive sleep apnea) 05/30/2022   Hypothyroidism 05/30/2022   Orthostatic hypotension due to  Parkinson's disease (HCC) 12/14/2021   Mixed hyperlipidemia 12/14/2021   Essential hypertension 12/14/2021   Palpitations 12/14/2021   Angina pectoris (HCC) 12/14/2021   Urge incontinence 01/15/2021   History of UTI 01/15/2021   Abnormal urine findings 01/15/2021   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   History of nephrolithiasis 01/15/2021   DVT, lower extremity, proximal, acute, right (HCC) 02/26/2020   Open scalp wound 01/22/2020   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HCC)     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   Parkinson's disease (HCC) 01/16/2018   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 "My parkinson's is acting up".   Pt accompanied by: significant other; [name]  PERTINENT HISTORY: PD (hx of STN DBS), depression, sleep apnea  PAIN:  Are you having pain? Yes: NPRS scale: 0 Pain location:   Pain description: chronic Aggravating factors: sitting up Relieving factors: N/A  FALLS: Has patient fallen in last 6 months?  Yes  LIVING ENVIRONMENT: Lives with: lives with their family; 1 Lives in: House/apartment  PLOF:  Level of assistance: Independent with ADLs, Independent with IADLs Employment: Retired  PATIENT GOALS: voice, cognition  OBJECTIVE:  Note: Objective measures were completed at Evaluation unless otherwise noted.  DIAGNOSTIC FINDINGS: No imaging on this EMR since 2021 CT Head.   COGNITION: Overall cognitive status: Impaired Areas of impairment:  Attention: Impaired: Selective, Alternating, Divided Memory: Impaired: Short term Prospective Executive function: Impaired: Organization and Planning Functional deficits: Difficulty with recall of details  MOTOR SPEECH: Overall motor speech: impaired Level of impairment: Conversation Respiration: Reduced support Phonation: normal Resonance: WFL Articulation: Impaired: conversation Intelligibility: Intelligibility reduced Motor planning: Appears intact Motor speech errors: NA Interfering components: NA Effective technique: slow rate, increased vocal intensity, and over articulate  ORAL MOTOR EXAMINATION: Overall  status: WFL Comments:    CLINICAL SWALLOW ASSESSMENT:   Current diet: regular and thin liquids Dentition: adequate natural dentition Patient directly observed with POs: Yes: thin liquids  Feeding: able to feed self Liquids provided by: cup and straw Oral phase signs and symptoms: NA Pharyngeal phase signs and symptoms: multiple swallows Comments: [facility] Swallow Protocol PASS   *Pt reports some difficulty with drier foods and occasionally getting strangled with liquids. No observed this date. To monitor.   SOCIAL HISTORY: Occupation: Warden/ranger intake: suboptimal Caffeine/alcohol intake: minimal Daily voice use: minimal  PERCEPTUAL VOICE ASSESSMENT: Voice quality: normal Vocal abuse: NA Resonance: normal Respiratory function: thoracic breathing  LSVT-LOUD VOICE EVALUATION: Maximum phonation time for sustained "ah": 13 sec Mean intensity during sustained "ah": 80 dB  Mean fundamental frequency during sustained "ah": 111 Hz  Mean intensity sustained during conversational speech: 70 dB Habitual pitch: 110 Hz Highest dynamic pitch when altering pitch from a low note to a high note: 175 Hz Lowest dynamic pitch when altering from a high note to a low note: 106 Hz Highest dynamic pitch during conversational speech: 159 Hz Lowest dynamic pitch during conversational speech: 82 Hz Average time patient was able to sustain /s/: 4.8 seconds Average time patient was able to sustain /z/: 13.8 seconds s/z ratio : .34 (indicating suspected respiratory inefficiency)   Speech is characterized by Other: . For trained listener in quiet environment with context, intelligibility was >90%.  Patient able to improve all parameters with model ("Loud like me") Stimulability: Improved vocal quality with loud voice (81 dB) for sustained vowel, functional phrases averaged 78 dB and improved pitch range with model.     The Communication Effectiveness Survey is a patient-reported outcome measure in which  the patient rates their own effectiveness in different communication situations. A higher score indicates greater effectiveness. Pt's self-rating was 27/32. Patient reported mild difficulty conversing with a telephone, conversing in a noisy environment, speaking when emotional, conversing when traveling in a car, having conversation with someone at a distance.   STANDARDIZED ASSESSMENTS: SLUMS: 24/30  PATIENT REPORTED OUTCOME MEASURES (PROM): Cognitive function: Short Form: 34 and Communication Effectiveness Survey: 27                                                                                                                            TREATMENT DATE:   08/05/23: Pt was seen for skilled ST services targeting dysarthria. Pt completed 5 sets of 5 on EMST today. He required min-mod verbal cueing to complete task. Pt required SLP to keep count of breaths and encouragement to pause in between. Pt completed voice exercises with therapist today.   Warm up: 82 dB avg  Reading: 81 dB avg  Cognitive Exercises 79 dB avg   08/02/23: Pt was seen for skilled ST services targeting dysarthria. Pt reports he has completed his EMST today. SLP led pt through warm up exercises re: diaphragmatic breathing; may, me, my, mo, moo; sustained phonation, and pitch glides. SLP instructed pt to project voice ("think loud"; "speak over the fence") during warm up. Pt read instructions for SPEAK OUT and performed exercises with minA. He required occasional verbal cues to continue with "intent" for entirety of reading exercise vs trailing off. Pt participated in structured conversation with consistent cues for increasing loudness.   07/28/23: Pt was seen for skilled ST services targeting education for wife and design of templates for EMST, SPEAK OUT, and Medication log due to pt having difficulty remembering to complete exercises. SLP encouraged wife to help patient set up a "memory and exercise station" with calendar, medication  log, and exercise log. Pt completed reading and cognitive portion of SPEAK OUT while SLP designed templates. Provided in clear sheet protector at end of session. Pt to bring EMST next session.   07/21/23: Pt was seen for skilled ST services targeting review of SPEAK OUT. Pt reports he did not complete any voice exercises due to company this week. SLP explained the importance of consistency with exercises for demonstrated improvement. He has not completed his EMST exercises in over a year. SLP reassessed on his personal device. Pt encouraged to set a routine to complete voice and respiratory exercises.    EMST 150 3X5 reps @ 75% of MEP - 30 cmH20 with 15 second rest  (45 cmH20 MEP)   07/12/23: Pt was seen for skilled ST services targeting review of SPEAK OUT. SLP educated on "  Living with Intent Means To..." found in his HEP book. Patient reported that "speaking with conscious effort" resonated the most with him and his idea of intent. SLP led patient through vocal warm up re: May-Me-My-Moe-Mo x5, sustained phonation /ah/ x5, pitch glides x5/each. Pt completed cognitive component of SPEAK OUT book independently (up and ___). Pt demonstrated completion of SPEAK OUT  exercises with spv. SLP set pt up with a system to separate his completion from previous therapy course (using pencil and X marks vs. Pen and Check Mark). Pt completed Lesson 1 with therapist for AM portion - to complete PM portion at home this evening.   07/08/23: Pt was seen for skilled ST services targeting continued voice assessment - see above. Pt also completed PROMs for cognitive function and dysarthria. Pt brought in his "Speak Out" book from previous therapist. SLP encouraged pt to continue with exercises. SLP is not "speak out" certified, but plans to use similar cues with exemplars from book for continuity of care.  PATIENT EDUCATION: Education details: PD and SLP role  Person educated: Patient and Spouse Education method:  Explanation Education comprehension: verbalized understanding   GOALS: Goals reviewed with patient? Yes  SHORT TERM GOALS: Target date: 08/07/23  Pt will complete baseline voice measures Baseline: Goal status:MET  2.  Pt will complete PROMs Baseline:  Goal status: MET  3.  Pt will demonstrate voice HEP independently Baseline:  Goal status: INITIAL  4.  Pt/wife will verbalize 3 memory strategies to improve recall of important information Baseline:  Goal status: INITIAL  5.   Baseline:  Goal status: INITIAL  6.   Baseline:  Goal status: INITIAL  LONG TERM GOALS: Target date: 09/06/23  Pt will improve score on PROMs Baseline:  Goal status: INITIAL  2.  Pt will demonstrate improved vocal intensity with sustained phonation Baseline:  Goal status: INITIAL  3.  Pt will demonstrate improved vocal intensity in 5 min, unstructured conversation Baseline:  Goal status: INITIAL  4.  Pt/wife will report success using memory strategies at home Baseline:  Goal status: INITIAL  5.   Baseline:  Goal status: INITIAL  6.   Baseline:  Goal status: INITIAL  ASSESSMENT:  CLINICAL IMPRESSION: Pt is a 78 yo male who presents to ST OP for evaluation for sx 2/2 Parkinson's Disease.  Pt endorses changes in vocal intensity, breath support, and cognition re: focus, memory. Pt has received speech therapy before at [facility] ~2 years ago. Wife reports he has been referred for a neuropsych evaluation for cognition; therefore, SLP to just complete a screen at this time. Pt was assessed using SLUMS - scoring a 24/30. Impairments noted in recall and word generation. Pt with complaints of occasional difficulty with thin liquids and solids. Pt passed YSP, with no overt s/sx of aspiration. Pt reports he has some difficulty with drier solids getting "stuck". SLP suggested moister meats re: chicken thighs vs chicken breast, cutting items into smaller pieces, and adding sauces/gravies to  drier foods. Unable to complete voice measures due to time constraints. To complete next session. SLP rec skilled ST services to address cognitive-communication impairment, dysarthria, and monitor dysphagia.    OBJECTIVE IMPAIRMENTS: include attention, memory, executive functioning, and dysarthria. These impairments are limiting patient from effectively communicating at home and in community. Factors affecting potential to achieve goals and functional outcome are NA. Patient will benefit from skilled SLP services to address above impairments and improve overall function.  REHAB POTENTIAL: Good  PLAN:  SLP  FREQUENCY: 1-2x/week  SLP DURATION: 8 weeks  PLANNED INTERVENTIONS: Diet toleration management , Cueing hierachy, Internal/external aids, Functional tasks, Multimodal communication approach, SLP instruction and feedback, Compensatory strategies, Patient/family education, and [address] Treatment of speech (30 or 45 min)     [employer], CCC-SLP 08/05/2023, 11:53 AM

## 2023-08-08 NOTE — Therapy (Signed)
 OUTPATIENT PHYSICAL THERAPY NEURO TREATMENT   Patient Name: Patrick Ross MRN: [id number] DOB:Jul 04, 1945, 78 y.o., male Today's Date: 08/08/2023   PCP: Jayne Mews MD REFERRING PROVIDER: Shirline Dover DO  END OF SESSION:        Past Medical History:  Diagnosis Date   Depression    DVT (deep venous thrombosis) (HCC)    GERD (gastroesophageal reflux disease)    History of kidney stones    passed   Hypertension    Hypothyroidism    Lumbar stenosis    OSA (obstructive sleep apnea)    wears cpap   Parkinson's disease (HCC)    PONV (postoperative nausea and vomiting)    REM behavioral disorder    Urinary incontinence    Past Surgical History:  Procedure Laterality Date   BACK SURGERY     x 2 - not fusion   BIOPSY  06/01/2022   Procedure: BIOPSY;  Surgeon: Baldo Bonds, MD;  Location: [facility];  Service: Gastroenterology;;   COLONOSCOPY     DEEP BRAIN STIMULATOR PLACEMENT     left 12/27/2012, right 12/10/2011, left revision 02/02/2013, replacement bilateral 11/21/2015   ESOPHAGOGASTRODUODENOSCOPY (EGD) WITH PROPOFOL  N/A 06/01/2022   Procedure: ESOPHAGOGASTRODUODENOSCOPY (EGD) WITH PROPOFOL ;  Surgeon: Baldo Bonds, MD;  Location: [facility];  Service: Gastroenterology;  Laterality: N/A;   MINOR PLACEMENT OF FIDUCIAL N/A 01/09/2019   Procedure: Fiducial placement;  Surgeon: Manya Sells, MD;  Location: [facility] OR;  Service: Neurosurgery;  Laterality: N/A;  Fiducial placement   PARTIAL NEPHRECTOMY Right 2005 ish   PULSE GENERATOR IMPLANT Right 01/23/2019   Procedure: Right chest implantable pulse generator change;  Surgeon: Manya Sells, MD;  Location: [facility] OR;  Service: Neurosurgery;  Laterality: Right;   SPINAL FUSION     lumbar Fusion x 2   SUBTHALAMIC STIMULATOR BATTERY REPLACEMENT Left 02/02/2018   Procedure: Change implantable pulse generator battery, Left chest;  Surgeon: Manya Sells, MD;  Location: [facility] OR;  Service: Neurosurgery;  Laterality: Left;    SUBTHALAMIC STIMULATOR BATTERY REPLACEMENT N/A 01/22/2020   Procedure: REMOVAL OF LEFT BRAIN DEEP BRAIN STIMULATOR ELECTRODE, EXTENSION, AND PULSE GENERATOR;  Surgeon: Manya Sells, MD;  Location: [facility] OR;  Service: Neurosurgery;  Laterality: N/A;   SUBTHALAMIC STIMULATOR BATTERY REPLACEMENT Right 02/23/2022   Procedure: CHANGE IPG BATTERY, RT CHEST;  Surgeon: Dawley, Colby Daub, DO;  Location: [facility] OR;  Service: Neurosurgery;  Laterality: Right;   SUBTHALAMIC STIMULATOR INSERTION Left 01/16/2019   Procedure: Left deep brain stimulator electrode revision.;  Surgeon: Manya Sells, MD;  Location: [facility] OR;  Service: Neurosurgery;  Laterality: Left;   SUBTHALAMIC STIMULATOR INSERTION Left 01/23/2019   Procedure: Repositioning of Left Deep Brain Stimulator Electrode;  Surgeon: Manya Sells, MD;  Location: [facility] OR;  Service: Neurosurgery;  Laterality: Left;   TONSILLECTOMY     Patient Active Problem List   Diagnosis Date Noted   Melena 06/01/2022   Acute cystitis without hematuria 05/31/2022   Upper GI bleed 05/31/2022   Generalized weakness 05/30/2022   Abdominal pain 05/30/2022   OSA (obstructive sleep apnea) 05/30/2022   Hypothyroidism 05/30/2022   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) 12/14/2021   Mixed hyperlipidemia 12/14/2021   Essential hypertension 12/14/2021   Palpitations 12/14/2021   Angina pectoris (HCC) 12/14/2021   Urge incontinence 01/15/2021   History of UTI 01/15/2021   Abnormal urine findings 01/15/2021   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  Rationale for Evaluation and Treatment: Rehabilitation  SUBJECTIVE:                                                                                                                                                                                             SUBJECTIVE STATEMENT: It is going okay. Nothing new.    EVAL-Pt and his wife report long history of parkinson's,  biggest difficulty with his mobility that they note is R lower back  pain and labored supine to sit movement, also poor foot clearance.  He doesn't like relying on the walker Pt accompanied by: significant other  PERTINENT HISTORY: parkinson's diagnosed greater than 10 yrs ago, deep brain stimulator R, had in L as well but had to be removed for various reasons.  walks around neighborhood 3 x week with aide Tuesdays and Thursdays parkisons bicycle class 2 x Lynelle Sara training for upper body work out Referred to PT and ST by neurologist   PAIN:  Are you having pain? R lower back pain constant  PRECAUTIONS: Fall and Other: deep brain stimulator on R   RED FLAGS: None   WEIGHT BEARING RESTRICTIONS: No  FALLS: Has patient fallen in last 6 months? No  LIVING ENVIRONMENT: Lives with: lives with their spouse Lives in: House/apartment Stairs: has internal steps, but has had home renovated and adapted so the he and his wife live in downstairs apartment, all one level with bathroom and kitchen Has following equipment at home: Single point cane, Environmental consultant - 4 wheeled, Tour manager, Grab bars, and Ramped entry  PLOF: Independent  PATIENT GOALS: improve bed mobility and improve gait, stability, avoid falls  OBJECTIVE:  Note: Objective measures were completed at Evaluation unless otherwise noted.  DIAGNOSTIC FINDINGS: see neuro notes, has deep brain stimulator R   COGNITION: Overall cognitive status: Within functional limits for tasks  assessed   SENSATION: WFL  COORDINATION: Slowed movements B and noted "curling" R hand and R foot at times, with gait festinating on turns   EDEMA:  None noted  MUSCLE TONE: noted curling R fingers, toes, knee flex and hip flex at times, randomly during evaluation  MUSCLE LENGTH: Hamstrings: Right -30 deg; Left -30 deg Thomas test: Right nt deg; Left nt deg    POSTURE: Standing  with some forward lean, weight on forefeet, L calf atrophied pt and wife report chronic after L lumbar surgery.     LOWER EXTREMITY ROM:   grossly wfl, hips not assessed as not positioned on mat today   LOWER EXTREMITY MMT:    MMT Right Eval Left Eval  Hip flexion 4 4-  Hip extension    Hip abduction    Hip adduction    Hip internal rotation    Hip external rotation    Knee flexion 4 4  Knee extension 4 4-  Ankle dorsiflexion 4 3+  Ankle plantarflexion 4- 3-  Ankle inversion    Ankle eversion    (Blank rows = not tested)  BED MOBILITY:  Not tested  TRANSFERS: I but slowed, uses hands on seat or with rollator  GAIT: Findings: Gait Characteristics: step to pattern, decreased step length- Right, decreased step length- Left, decreased stride length, decreased hip/knee flexion- Right, decreased hip/knee flexion- Left, shuffling, festinating, trunk flexed, and narrow BOS, Distance walked: 100', Assistive device utilized:Walker - 4 wheeled, Level of assistance: SBA, and Comments: festinating noted on turns, tends to turn L  FUNCTIONAL TESTS:  30 seconds chair stand test 6 reps Timed up and go (TUG): 33.39 with rollator Standing static feet together 15 sec Standing semi tandem 10 sec each leg, needed CGA with L foot forward Standing marching, required B hand hold support to maintain balance                                                                                                                              TREATMENT DATE: 08/09/23 NuStep  STS Standing march 3#  Leg ext HS curls     08/04/23 30s chair test TUG 27s  MMT 4/5 Standing shoulder ext and rows green band 2x10 Standing shoulder flexion 2# WaTE x10 - stopped due to feeling dizzy and off balance  NuStep L5 x54mins  Seated rotation with yellow ball 2x10   08/02/23 NuStep L5x53mins  Side steps over obstacles in bars Forwards steps over obstacles in bars Calf raises x10 Box taps 4" 1HRA Step ups 4" 2HRA  STS with OHP 2x5  07/28/23 Supine HS stretch and SKTC  Lower trunk rotations x10 SLR 2x10 Feet on pball rotations and knees to chest AB isometric holds x10  NuStep L5x91mins  STS 3x5   07/27/23 NuStep L5x59mins  LAQ 2# 2x10 HS curls green 2x10  Hip abduction green 2x10 STS 2x10  Stretching with pball rotations and flexion x5     07/06/23 Evaluation, advised to attempt prone lying on bed 10 to 15 min  to stretch body into extension( pt doesn't ever lie prone)   PATIENT EDUCATION: Education details: POC, goals Person educated: Patient and Spouse Education method: Explanation, Demonstration, Tactile cues, and Verbal cues Education comprehension: verbalized understanding, verbal cues required, and needs further education  HOME EXERCISE PROGRAM: Access Code: [email or web address] URL: [email or web address] Date: 08/02/2023 Prepared by: Donavon Fudge  Exercises - Supine Hamstring Stretch with Strap  - 2 x daily - 7 x weekly - 2 reps - 15 hold - Modified Thomas Stretch  - 2 x daily - 7 x weekly - 2 reps - 15 hold - Supine Bridge  - 1 x daily - 7 x weekly - 2 sets - 10 reps - Standing Shoulder Horizontal Abduction with Resistance  - 1 x daily - 7 x weekly - 2 sets - 10 reps  GOALS: Goals reviewed with patient? Yes  SHORT TERM GOALS: Target date: 4 weeks, 08/03/23  I home program for posture and for hamstring, hip flexor stretchig Baseline: Goal status: ongoing 08/02/23  LONG TERM GOALS: Target date: 09/28/23: 12 weeks   30 sec sit to stand improve from 6 reps to 12 reps for improved  coordination, strength, endurance LEs Baseline:  Goal status: 7 reps ongoing 08/04/23  2.  I and pain free supine to sit motion , rolling to R Baseline: very painful per pt and wife history, not specifically assessed in clinic today Goal status: ongoing 07/28/23  3.  TUG score improve from 33 sec to 18 or less for reduced fall risk   Baseline:  Goal status: 27s ongoing 08/04/23  4.  Improve strength LE's for B quads, hamstrings to 4+5 for improved functional strength Baseline: 4- Goal status: 4/5 08/04/23   ASSESSMENT:  CLINICAL IMPRESSION: Patient returns with no reports of anything new. He has made some progress with LTG's. TUG has improved by 6s. STS are still difficult. He has some LOB backwards with standing exercises using band. With standing shoulder flexion he became dizzy and unsteady so we sat and took a break. Will continue to progress to improve his overall function. HEP given.   EVAL Patient is a 78 y.o. male who was evaluated today by physical therapy due to referral from his neurologist, with decline in function, particularly control of R extremities, with his Parkinson's diagnosis.  He and his wife have been proactive about maintaining his function, he attends a training/ strengthening session locally 2 x week , attends parkinson's cycling weekly, and walks around 0.6 miles with his aide 3 x week outdoors with his rollator.  They recently have adapted their home with a main floor renovation so that their bedroom, bath, kitchen are all on one level.   Main problems identified today are weakness B LE's, L ankle is weak chronically due to lumbar radiculopathy and B knee, hips musculature is weak.  He also has coordination deficits with festinating gait particularly on turns and static / dynamic balance deficits.  Has chronic R sided lower back pain which is particularly intense with moving supine to sit.  He should benefit from physical therapy intervention to address his deficits and  update his current routine for stretching and strengthening at home.    OBJECTIVE IMPAIRMENTS: Abnormal gait, decreased activity tolerance, decreased balance, decreased coordination, decreased endurance, decreased mobility, difficulty walking, decreased strength, hypomobility, impaired perceived functional ability, impaired UE functional use, postural dysfunction, and pain.   ACTIVITY LIMITATIONS: carrying, lifting, squatting, stairs, transfers, and locomotion level  PARTICIPATION LIMITATIONS: meal prep, cleaning, laundry, shopping, community activity, and church  PERSONAL FACTORS: Age, Behavior pattern, Fitness, Past/current experiences, Time since onset of injury/illness/exacerbation, and 1-2 comorbidities: h/o L lumbar radiculopathy and weakness L LE, chronic R sided lower back pain, are also affecting patient's functional outcome.   REHAB POTENTIAL: Good  CLINICAL DECISION MAKING: Evolving/moderate complexity  EVALUATION COMPLEXITY: Moderate  PLAN:  PT  FREQUENCY: 2x/week  PT DURATION: 12 weeks  PLANNED INTERVENTIONS: 97110-Therapeutic exercises, 97530- Therapeutic activity, [id number]- Neuromuscular re-education, 97535- Self Care, and [id number]- Manual therapy  PLAN FOR NEXT SESSION: recheck goals    Donavon Fudge, PT, DPT 08/08/2023, 4:11 PM

## 2023-08-09 ENCOUNTER — Encounter: Payer: Self-pay | Admitting: Speech Pathology

## 2023-08-09 ENCOUNTER — Ambulatory Visit: Admitting: Speech Pathology

## 2023-08-09 ENCOUNTER — Ambulatory Visit

## 2023-08-09 DIAGNOSIS — G20B2 Parkinson's disease with dyskinesia, with fluctuations: Secondary | ICD-10-CM

## 2023-08-09 DIAGNOSIS — R278 Other lack of coordination: Secondary | ICD-10-CM

## 2023-08-09 DIAGNOSIS — R262 Difficulty in walking, not elsewhere classified: Secondary | ICD-10-CM | POA: Diagnosis not present

## 2023-08-09 DIAGNOSIS — R471 Dysarthria and anarthria: Secondary | ICD-10-CM

## 2023-08-09 DIAGNOSIS — R41841 Cognitive communication deficit: Secondary | ICD-10-CM

## 2023-08-09 NOTE — Therapy (Unsigned)
 OUTPATIENT SPEECH LANGUAGE PATHOLOGY TREATMENT   Patient Name: Patrick Ross MRN: [id number] DOB:07-22-1945, 78 y.o., male Today's Date: 08/09/2023  PCP: Jayne Mews, MD REFERRING PROVIDER: Shirline Dover, DO  END OF SESSION:  End of Session - 08/09/23 1232     Visit Number 8    Number of Visits 17    Date for SLP Re-Evaluation 09/06/23    SLP Start Time 1230    SLP Stop Time  1310    SLP Time Calculation (min) 40 min    Activity Tolerance Patient tolerated treatment well             Past Medical History:  Diagnosis Date   Depression    DVT (deep venous thrombosis) (HCC)    GERD (gastroesophageal reflux disease)    History of kidney stones    passed   Hypertension    Hypothyroidism    Lumbar stenosis    OSA (obstructive sleep apnea)    wears cpap   Parkinson's disease (HCC)    PONV (postoperative nausea and vomiting)    REM behavioral disorder    Urinary incontinence    Past Surgical History:  Procedure Laterality Date   BACK SURGERY     x 2 - not fusion   BIOPSY  06/01/2022   Procedure: BIOPSY;  Surgeon: Baldo Bonds, MD;  Location: [facility];  Service: Gastroenterology;;   COLONOSCOPY     DEEP BRAIN STIMULATOR PLACEMENT     left 12/27/2012, right 12/10/2011, left revision 02/02/2013, replacement bilateral 11/21/2015   ESOPHAGOGASTRODUODENOSCOPY (EGD) WITH PROPOFOL  N/A 06/01/2022   Procedure: ESOPHAGOGASTRODUODENOSCOPY (EGD) WITH PROPOFOL ;  Surgeon: Baldo Bonds, MD;  Location: [facility];  Service: Gastroenterology;  Laterality: N/A;   MINOR PLACEMENT OF FIDUCIAL N/A 01/09/2019   Procedure: Fiducial placement;  Surgeon: Manya Sells, MD;  Location: [facility] OR;  Service: Neurosurgery;  Laterality: N/A;  Fiducial placement   PARTIAL NEPHRECTOMY Right 2005 ish   PULSE GENERATOR IMPLANT Right 01/23/2019   Procedure: Right chest implantable pulse generator change;  Surgeon: Manya Sells, MD;  Location: [facility] OR;  Service: Neurosurgery;  Laterality:  Right;   SPINAL FUSION     lumbar Fusion x 2   SUBTHALAMIC STIMULATOR BATTERY REPLACEMENT Left 02/02/2018   Procedure: Change implantable pulse generator battery, Left chest;  Surgeon: Manya Sells, MD;  Location: [facility] OR;  Service: Neurosurgery;  Laterality: Left;   SUBTHALAMIC STIMULATOR BATTERY REPLACEMENT N/A 01/22/2020   Procedure: REMOVAL OF LEFT BRAIN DEEP BRAIN STIMULATOR ELECTRODE, EXTENSION, AND PULSE GENERATOR;  Surgeon: Manya Sells, MD;  Location: [facility] OR;  Service: Neurosurgery;  Laterality: N/A;   SUBTHALAMIC STIMULATOR BATTERY REPLACEMENT Right 02/23/2022   Procedure: CHANGE IPG BATTERY, RT CHEST;  Surgeon: Dawley, Colby Daub, DO;  Location: [facility] OR;  Service: Neurosurgery;  Laterality: Right;   SUBTHALAMIC STIMULATOR INSERTION Left 01/16/2019   Procedure: Left deep brain stimulator electrode revision.;  Surgeon: Manya Sells, MD;  Location: [facility] OR;  Service: Neurosurgery;  Laterality: Left;   SUBTHALAMIC STIMULATOR INSERTION Left 01/23/2019   Procedure: Repositioning of Left Deep Brain Stimulator Electrode;  Surgeon: Manya Sells, MD;  Location: [facility] OR;  Service: Neurosurgery;  Laterality: Left;   TONSILLECTOMY     Patient Active Problem List   Diagnosis Date Noted   Melena 06/01/2022   Acute cystitis without hematuria 05/31/2022   Upper GI bleed 05/31/2022   Generalized weakness 05/30/2022   Abdominal pain 05/30/2022   OSA (obstructive sleep apnea) 05/30/2022   Hypothyroidism 05/30/2022   Orthostatic hypotension due to  Parkinson's disease (HCC) 12/14/2021   Mixed hyperlipidemia 12/14/2021   Essential hypertension 12/14/2021   Palpitations 12/14/2021   Angina pectoris (HCC) 12/14/2021   Urge incontinence 01/15/2021   History of UTI 01/15/2021   Abnormal urine findings 01/15/2021   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   History of nephrolithiasis 01/15/2021   DVT, lower extremity, proximal, acute, right (HCC) 02/26/2020   Open scalp wound 01/22/2020   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HCC)     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   Parkinson's disease (HCC) 01/16/2018   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  Pt accompanied by: significant other; [name]  PERTINENT HISTORY: PD (hx of STN DBS), depression, sleep apnea  PAIN:  Are you having pain? Yes: NPRS scale: 2 Pain location: hip Pain description: chronic Aggravating factors: sitting up Relieving factors: N/A  FALLS: Has patient fallen in last 6 months?  Yes  LIVING ENVIRONMENT: Lives with: lives with their family; 1 Lives in: House/apartment  PLOF:  Level of assistance: Independent with ADLs, Independent with IADLs Employment: Retired  PATIENT GOALS: voice, cognition  OBJECTIVE:  Note: Objective measures were completed at Evaluation unless otherwise noted.  DIAGNOSTIC FINDINGS: No imaging on this EMR since 2021 CT Head.   COGNITION: Overall cognitive status: Impaired Areas of impairment:  Attention: Impaired: Selective, Alternating, Divided Memory: Impaired: Short term Prospective Executive function: Impaired: Organization and Planning Functional deficits: Difficulty with recall of details  MOTOR SPEECH: Overall motor speech: impaired Level of impairment: Conversation Respiration: Reduced support Phonation: normal Resonance: WFL Articulation: Impaired: conversation Intelligibility: Intelligibility reduced Motor planning: Appears intact Motor speech errors: NA Interfering components: NA Effective technique: slow rate, increased vocal intensity, and over articulate  ORAL MOTOR  EXAMINATION: Overall status: WFL Comments:    CLINICAL SWALLOW ASSESSMENT:   Current diet: regular and thin liquids Dentition: adequate natural dentition Patient directly observed with POs: Yes: thin liquids  Feeding: able to feed self Liquids provided by: cup and straw Oral phase signs and symptoms: NA Pharyngeal phase signs and symptoms: multiple swallows Comments: [facility] Swallow Protocol PASS   *Pt reports some difficulty with drier foods and occasionally getting strangled with liquids. No observed this date. To monitor.   SOCIAL HISTORY: Occupation: Warden/ranger intake: suboptimal Caffeine/alcohol intake: minimal Daily voice use: minimal  PERCEPTUAL VOICE ASSESSMENT: Voice quality: normal Vocal abuse: NA Resonance: normal Respiratory function: thoracic breathing  LSVT-LOUD VOICE EVALUATION: Maximum phonation time for sustained "ah": 13 sec Mean intensity during sustained "ah": 80 dB  Mean fundamental frequency during sustained "ah": 111 Hz  Mean intensity sustained during conversational speech: 70 dB Habitual pitch: 110 Hz Highest dynamic pitch when altering pitch from a low note to a high note: 175 Hz Lowest dynamic pitch when altering from a high note to a low note: 106 Hz Highest dynamic pitch during conversational speech: 159 Hz Lowest dynamic pitch during conversational speech: 82 Hz Average time patient was able to sustain /s/: 4.8 seconds Average time patient was able to sustain /z/: 13.8 seconds s/z ratio : .34 (indicating suspected respiratory inefficiency)   Speech is characterized by Other: . For trained listener in quiet environment with context, intelligibility was >90%.  Patient able to improve all parameters with model ("Loud like me") Stimulability: Improved vocal quality with loud voice (81 dB) for sustained vowel, functional phrases averaged 78 dB and improved pitch range with model.     The Communication Effectiveness Survey is a patient-reported  outcome measure in which the patient rates their own effectiveness in different communication situations. A higher score indicates greater effectiveness. Pt's self-rating was 27/32. Patient reported mild difficulty conversing with a telephone, conversing in a noisy environment, speaking when emotional, conversing when traveling in a car, having conversation with someone at a distance.   STANDARDIZED ASSESSMENTS: SLUMS: 24/30  PATIENT REPORTED OUTCOME MEASURES (PROM): Cognitive function: Short Form: 34 and Communication Effectiveness Survey: 27                                                                                                                            TREATMENT DATE:   08/09/23:  08/05/23: Pt was seen for skilled ST services targeting dysarthria. Pt completed 5 sets of 5 on EMST today. He required min-mod verbal cueing to complete task. Pt required SLP to keep count of breaths and encouragement to pause in between. Pt completed voice exercises with therapist today.   Warm up: 82 dB avg  Reading: 81 dB avg  Cognitive Exercises 79 dB avg   08/02/23: Pt was seen for skilled ST services targeting dysarthria. Pt reports he has completed his EMST today. SLP led pt through warm up exercises re: diaphragmatic breathing; may, me, my, mo, moo; sustained phonation, and pitch glides. SLP instructed pt to project voice ("think loud"; "speak over the fence") during warm up. Pt read instructions for SPEAK OUT and performed exercises with minA. He required occasional verbal cues to continue with "intent" for entirety of reading exercise vs trailing off. Pt participated in structured conversation with consistent cues for increasing loudness.   07/28/23: Pt was seen for skilled ST services targeting education for wife and design of templates for EMST, SPEAK OUT, and Medication log due to pt having difficulty remembering to complete exercises. SLP encouraged wife to help patient set up a "memory and  exercise station" with calendar, medication log, and exercise log. Pt completed reading and cognitive portion of SPEAK OUT while SLP designed templates. Provided in clear sheet protector at end of session. Pt to bring EMST next session.   07/21/23: Pt was seen for skilled ST services targeting review of SPEAK OUT. Pt reports he did not complete any voice exercises due to company this week. SLP explained the importance of consistency with exercises for demonstrated improvement. He has not completed his EMST exercises in over a year. SLP reassessed on his personal device. Pt encouraged to set a routine to complete voice and respiratory exercises.    EMST 150 3X5 reps @ 75% of MEP - 30 cmH20 with 15 second rest  (45 cmH20 MEP)   07/12/23: Pt was seen for skilled ST services targeting review of SPEAK OUT. SLP  educated on "Living with Intent Means To..." found in his HEP book. Patient reported that "speaking with conscious effort" resonated the most with him and his idea of intent. SLP led patient through vocal warm up re: May-Me-My-Moe-Mo x5, sustained phonation /ah/ x5, pitch glides x5/each. Pt completed cognitive component of SPEAK OUT book independently (up and ___). Pt demonstrated completion of SPEAK OUT  exercises with spv. SLP set pt up with a system to separate his completion from previous therapy course (using pencil and X marks vs. Pen and Check Mark). Pt completed Lesson 1 with therapist for AM portion - to complete PM portion at home this evening.   07/08/23: Pt was seen for skilled ST services targeting continued voice assessment - see above. Pt also completed PROMs for cognitive function and dysarthria. Pt brought in his "Speak Out" book from previous therapist. SLP encouraged pt to continue with exercises. SLP is not "speak out" certified, but plans to use similar cues with exemplars from book for continuity of care.  PATIENT EDUCATION: Education details: PD and SLP role  Person educated:  Patient and Spouse Education method: Explanation Education comprehension: verbalized understanding   GOALS: Goals reviewed with patient? Yes  SHORT TERM GOALS: Target date: 08/07/23  Pt will complete baseline voice measures Baseline: Goal status:MET  2.  Pt will complete PROMs Baseline:  Goal status: MET  3.  Pt will demonstrate voice HEP independently Baseline:  Goal status: INITIAL  4.  Pt/wife will verbalize 3 memory strategies to improve recall of important information Baseline:  Goal status: INITIAL  5.   Baseline:  Goal status: INITIAL  6.   Baseline:  Goal status: INITIAL  LONG TERM GOALS: Target date: 09/06/23  Pt will improve score on PROMs Baseline:  Goal status: INITIAL  2.  Pt will demonstrate improved vocal intensity with sustained phonation Baseline:  Goal status: INITIAL  3.  Pt will demonstrate improved vocal intensity in 5 min, unstructured conversation Baseline:  Goal status: INITIAL  4.  Pt/wife will report success using memory strategies at home Baseline:  Goal status: INITIAL  5.   Baseline:  Goal status: INITIAL  6.   Baseline:  Goal status: INITIAL  ASSESSMENT:  CLINICAL IMPRESSION: Pt is a 78 yo male who presents to ST OP for evaluation for sx 2/2 Parkinson's Disease.  Pt endorses changes in vocal intensity, breath support, and cognition re: focus, memory. Pt has received speech therapy before at [facility] ~2 years ago. Wife reports he has been referred for a neuropsych evaluation for cognition; therefore, SLP to just complete a screen at this time. Pt was assessed using SLUMS - scoring a 24/30. Impairments noted in recall and word generation. Pt with complaints of occasional difficulty with thin liquids and solids. Pt passed YSP, with no overt s/sx of aspiration. Pt reports he has some difficulty with drier solids getting "stuck". SLP suggested moister meats re: chicken thighs vs chicken breast, cutting items into smaller  pieces, and adding sauces/gravies to drier foods. Unable to complete voice measures due to time constraints. To complete next session. SLP rec skilled ST services to address cognitive-communication impairment, dysarthria, and monitor dysphagia.    OBJECTIVE IMPAIRMENTS: include attention, memory, executive functioning, and dysarthria. These impairments are limiting patient from effectively communicating at home and in community. Factors affecting potential to achieve goals and functional outcome are NA. Patient will benefit from skilled SLP services to address above impairments and improve overall function.  REHAB POTENTIAL: Good  PLAN:  SLP FREQUENCY: 1-2x/week  SLP DURATION: 8 weeks  PLANNED INTERVENTIONS: Diet toleration management , Cueing hierachy, Internal/external aids, Functional tasks, Multimodal communication approach, SLP instruction and feedback, Compensatory strategies, Patient/family education, and 14782 Treatment of speech (30 or 45 min)     [employer], CCC-SLP 08/09/2023, 12:33 PM

## 2023-08-11 ENCOUNTER — Ambulatory Visit

## 2023-08-11 ENCOUNTER — Ambulatory Visit: Admitting: Speech Pathology

## 2023-08-11 ENCOUNTER — Encounter: Payer: Self-pay | Admitting: Speech Pathology

## 2023-08-11 DIAGNOSIS — G20B2 Parkinson's disease with dyskinesia, with fluctuations: Secondary | ICD-10-CM

## 2023-08-11 DIAGNOSIS — R41841 Cognitive communication deficit: Secondary | ICD-10-CM

## 2023-08-11 DIAGNOSIS — R471 Dysarthria and anarthria: Secondary | ICD-10-CM

## 2023-08-11 DIAGNOSIS — R262 Difficulty in walking, not elsewhere classified: Secondary | ICD-10-CM

## 2023-08-11 DIAGNOSIS — R278 Other lack of coordination: Secondary | ICD-10-CM

## 2023-08-11 NOTE — Therapy (Signed)
 OUTPATIENT PHYSICAL THERAPY NEURO TREATMENT   Patient Name: Patrick Ross MRN: [id number] DOB:1945-08-04, 78 y.o., male Today's Date: 08/11/2023   PCP: Jayne Mews MD REFERRING PROVIDER: Shirline Dover DO  END OF SESSION:  PT End of Session - 08/11/23 1316     Visit Number 7    Authorization Type Medicare    PT Start Time 1315    PT Stop Time 1400    PT Time Calculation (min) 45 min                Past Medical History:  Diagnosis Date   Depression    DVT (deep venous thrombosis) (HCC)    GERD (gastroesophageal reflux disease)    History of kidney stones    passed   Hypertension    Hypothyroidism    Lumbar stenosis    OSA (obstructive sleep apnea)    wears cpap   Parkinson's disease (HCC)    PONV (postoperative nausea and vomiting)    REM behavioral disorder    Urinary incontinence    Past Surgical History:  Procedure Laterality Date   BACK SURGERY     x 2 - not fusion   BIOPSY  06/01/2022   Procedure: BIOPSY;  Surgeon: Baldo Bonds, MD;  Location: [facility];  Service: Gastroenterology;;   COLONOSCOPY     DEEP BRAIN STIMULATOR PLACEMENT     left 12/27/2012, right 12/10/2011, left revision 02/02/2013, replacement bilateral 11/21/2015   ESOPHAGOGASTRODUODENOSCOPY (EGD) WITH PROPOFOL  N/A 06/01/2022   Procedure: ESOPHAGOGASTRODUODENOSCOPY (EGD) WITH PROPOFOL ;  Surgeon: Baldo Bonds, MD;  Location: [facility];  Service: Gastroenterology;  Laterality: N/A;   MINOR PLACEMENT OF FIDUCIAL N/A 01/09/2019   Procedure: Fiducial placement;  Surgeon: Manya Sells, MD;  Location: [facility] OR;  Service: Neurosurgery;  Laterality: N/A;  Fiducial placement   PARTIAL NEPHRECTOMY Right 2005 ish   PULSE GENERATOR IMPLANT Right 01/23/2019   Procedure: Right chest implantable pulse generator change;  Surgeon: Manya Sells, MD;  Location: [facility] OR;  Service: Neurosurgery;  Laterality: Right;   SPINAL FUSION     lumbar Fusion x 2   SUBTHALAMIC STIMULATOR BATTERY  REPLACEMENT Left 02/02/2018   Procedure: Change implantable pulse generator battery, Left chest;  Surgeon: Manya Sells, MD;  Location: [facility] OR;  Service: Neurosurgery;  Laterality: Left;   SUBTHALAMIC STIMULATOR BATTERY REPLACEMENT N/A 01/22/2020   Procedure: REMOVAL OF LEFT BRAIN DEEP BRAIN STIMULATOR ELECTRODE, EXTENSION, AND PULSE GENERATOR;  Surgeon: Manya Sells, MD;  Location: [facility];  Service: Neurosurgery;  Laterality: N/A;   SUBTHALAMIC STIMULATOR BATTERY REPLACEMENT Right 02/23/2022   Procedure: CHANGE IPG BATTERY, RT CHEST;  Surgeon: Dawley, Colby Daub, DO;  Location: [facility] OR;  Service: Neurosurgery;  Laterality: Right;   SUBTHALAMIC STIMULATOR INSERTION Left 01/16/2019   Procedure: Left deep brain stimulator electrode revision.;  Surgeon: Manya Sells, MD;  Location: [facility] OR;  Service: Neurosurgery;  Laterality: Left;   SUBTHALAMIC STIMULATOR INSERTION Left 01/23/2019   Procedure: Repositioning of Left Deep Brain Stimulator Electrode;  Surgeon: Manya Sells, MD;  Location: [facility] OR;  Service: Neurosurgery;  Laterality: Left;   TONSILLECTOMY     Patient Active Problem List   Diagnosis Date Noted   Melena 06/01/2022   Acute cystitis without hematuria 05/31/2022   Upper GI bleed 05/31/2022   Generalized weakness 05/30/2022   Abdominal pain 05/30/2022   OSA (obstructive sleep apnea) 05/30/2022   Hypothyroidism 05/30/2022   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) 12/14/2021   Mixed hyperlipidemia 12/14/2021   Essential hypertension 12/14/2021  Palpitations 12/14/2021   Angina pectoris (HCC) 12/14/2021   Urge incontinence 01/15/2021   History of UTI 01/15/2021   Abnormal urine findings 01/15/2021   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   History of nephrolithiasis 01/15/2021   DVT, lower extremity, proximal, acute, right (HCC) 02/26/2020   Open scalp wound 01/22/2020   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HCC)    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   Parkinson's disease (HCC)  01/16/2018   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) 01/16/2018   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 My parkinson's is acting up, my toes and fingers are curling and shaking more.   EVAL-Pt and his wife report long history of parkinson's,  biggest difficulty with his mobility that they note is R lower back  pain and labored supine to sit movement, also poor foot clearance.  He doesn't like relying on the walker Pt accompanied by: significant other  PERTINENT HISTORY: parkinson's diagnosed greater than 10 yrs ago, deep brain stimulator R, had in L as well but had to be removed for various reasons.  walks around neighborhood 3 x week with aide Tuesdays and Thursdays parkisons bicycle class 2 x Lynelle Sara training for upper body work out Referred to PT and ST by neurologist   PAIN:  Are you having pain? R lower back pain constant  PRECAUTIONS: Fall and Other: deep brain stimulator on R   RED FLAGS: None   WEIGHT BEARING RESTRICTIONS: No  FALLS: Has patient fallen in last 6 months? No  LIVING ENVIRONMENT: Lives with: lives with their spouse Lives in: House/apartment Stairs: has internal steps, but has had home renovated and adapted so the he and his wife live in downstairs apartment, all one level with bathroom and kitchen Has following equipment at home: Single point cane, Environmental consultant - 4 wheeled, Nurse, mental health, Grab bars, and Ramped entry  PLOF: Independent  PATIENT GOALS: improve bed mobility and improve gait, stability, avoid falls  OBJECTIVE:  Note: Objective measures were completed at Evaluation unless otherwise noted.  DIAGNOSTIC FINDINGS: see neuro notes, has deep brain stimulator R   COGNITION: Overall  cognitive status: Within functional limits for tasks assessed   SENSATION: WFL  COORDINATION: Slowed movements B and noted curling R hand and R foot at times, with gait festinating on turns   EDEMA:  None noted  MUSCLE TONE: noted curling R fingers, toes, knee flex and hip flex at times, randomly during evaluation  MUSCLE LENGTH: Hamstrings: Right -30 deg; Left -30 deg Thomas test: Right nt deg; Left nt deg    POSTURE: Standing with some forward lean, weight on forefeet, L calf atrophied pt and wife report chronic after L lumbar surgery.     LOWER EXTREMITY ROM:   grossly wfl, hips not assessed as not positioned on mat today   LOWER EXTREMITY MMT:    MMT Right Eval Left Eval  Hip flexion 4 4-  Hip extension    Hip abduction    Hip adduction    Hip internal rotation    Hip external rotation    Knee flexion 4 4  Knee extension 4 4-  Ankle dorsiflexion 4 3+  Ankle plantarflexion 4- 3-  Ankle inversion    Ankle eversion    (Blank rows = not tested)  BED MOBILITY:  Not tested  TRANSFERS: I but slowed, uses hands on seat or with rollator  GAIT: Findings: Gait Characteristics: step to pattern, decreased step length- Right, decreased step length- Left, decreased stride length, decreased hip/knee flexion- Right, decreased hip/knee flexion- Left, shuffling, festinating, trunk flexed, and narrow BOS, Distance walked: 100', Assistive device utilized:Walker - 4 wheeled, Level of assistance: SBA, and Comments: festinating noted on turns, tends to turn L  FUNCTIONAL TESTS:  30 seconds chair stand test 6 reps Timed up and go (TUG): 33.39 with  rollator Standing static feet together 15 sec Standing semi tandem 10 sec each leg, needed CGA with L foot forward Standing marching, required B hand hold support to maintain balance                                                                                                                              TREATMENT DATE: 08/11/23 NuStep L5x19mins  Fitter pushes 2 blue bands 2x10  Side steps over obstacles in bars  On airex reaching for numbers  Isometric ab push downs 2x10   08/09/23 NuStep L5x40mins  STS with chest press red ball 2x8 Standing march 3#  3# hip abd 2x10  Horizontal abd green 2x10 Leg ext 10# 2x10 HS curls 25# 2x10    08/04/23 30s chair test TUG 27s  MMT 4/5 Standing shoulder ext and rows green band 2x10 Standing shoulder flexion 2# WaTE x10 - stopped due to feeling dizzy and off balance  NuStep L5 x60mins  Seated rotation with yellow ball 2x10   08/02/23 NuStep L5x38mins  Side steps over obstacles in bars Forwards steps over obstacles in bars Calf raises x10 Box taps 4 1HRA Step ups 4 2HRA  STS with OHP 2x5  07/28/23 Supine HS stretch and SKTC  Lower trunk rotations x10 SLR 2x10 Feet on pball rotations and knees to chest AB isometric holds x10  NuStep L5x72mins  STS 3x5   07/27/23 NuStep L5x32mins  LAQ 2# 2x10 HS curls green 2x10  Hip abduction green 2x10 STS 2x10  Stretching with pball rotations and flexion x5     07/06/23 Evaluation, advised to attempt prone lying on bed 10 to 15 min  to stretch body into extension( pt doesn't ever lie prone)   PATIENT EDUCATION: Education details: POC, goals Person educated: Patient and Spouse Education method: Explanation, Demonstration, Tactile cues, and Verbal cues Education comprehension: verbalized understanding, verbal cues required, and needs further education  HOME EXERCISE PROGRAM: Access Code: [email or web address] URL: [email or web address] Date: 08/02/2023 Prepared by: Donavon Fudge  Exercises - Supine Hamstring Stretch with Strap  - 2 x daily - 7 x weekly - 2 reps - 15 hold - Modified Thomas Stretch  - 2 x daily - 7 x weekly - 2 reps - 15 hold - Supine Bridge  - 1 x daily - 7 x weekly - 2 sets - 10 reps - Standing Shoulder Horizontal Abduction with Resistance  - 1 x daily - 7 x weekly - 2 sets - 10 reps  GOALS: Goals reviewed with patient? Yes  SHORT TERM GOALS: Target date: 4 weeks, 08/03/23  I home program for posture and for hamstring, hip flexor stretchig Baseline: Goal status: ongoing 08/02/23  LONG TERM GOALS: Target date: 09/28/23: 12 weeks   30 sec sit to stand improve from 6 reps to 12 reps for improved coordination, strength, endurance LEs Baseline:  Goal status: 7 reps ongoing 08/04/23  2.  I and pain free supine to sit motion , rolling to R Baseline: very painful per pt and wife history, not specifically assessed in clinic today Goal status: ongoing 07/28/23, ongoing 08/09/23  3.  TUG score improve from 33 sec to 18 or less for reduced fall risk   Baseline:  Goal status: 27s ongoing 08/04/23  4.  Improve strength LE's for B quads, hamstrings to 4+5 for improved functional strength Baseline: 4- Goal status: 4/5 08/04/23   ASSESSMENT:  CLINICAL IMPRESSION: Patient reports he feels his Parkinson's is acting up more today with toe and finger curling and increased tremors. He is moving slower today compared to previous visits and reports being more tired. Continued to work on functional strengthening and some balance today. Is able to do reaching while on airex without holding on. Will continue to progress to improve his overall function. Pt tried to do some HEP at home and says bridges are still hard.    EVAL Patient is a 78 y.o. male who was evaluated today by physical therapy due to referral from his neurologist, with decline in function, particularly control of R extremities, with his Parkinson's diagnosis.  He and his wife have been proactive  about maintaining his function, he attends a training/ strengthening session locally 2 x week , attends parkinson's cycling weekly, and walks around 0.6 miles with his aide 3 x week outdoors with his rollator.  They recently have adapted their home with a main floor renovation so that their bedroom, bath, kitchen are all on one level.   Main problems identified today are weakness B LE's, L ankle is weak chronically due to lumbar radiculopathy and B knee, hips musculature is weak.  He also has coordination deficits with festinating gait particularly on turns and static / dynamic balance deficits.  Has chronic R sided lower back  pain which is particularly intense with moving supine to sit.  He should benefit from physical therapy intervention to address his deficits and update his current routine for stretching and strengthening at home.    OBJECTIVE IMPAIRMENTS: Abnormal gait, decreased activity tolerance, decreased balance, decreased coordination, decreased endurance, decreased mobility, difficulty walking, decreased strength, hypomobility, impaired perceived functional ability, impaired UE functional use, postural dysfunction, and pain.   ACTIVITY LIMITATIONS: carrying, lifting, squatting, stairs, transfers, and locomotion level  PARTICIPATION LIMITATIONS: meal prep, cleaning, laundry, shopping, community activity, and church  PERSONAL FACTORS: Age, Behavior pattern, Fitness, Past/current experiences, Time since onset of injury/illness/exacerbation, and 1-2 comorbidities: h/o L lumbar radiculopathy and weakness L LE, chronic R sided lower back pain, are also affecting patient's functional outcome.   REHAB POTENTIAL: Good  CLINICAL DECISION MAKING: Evolving/moderate complexity  EVALUATION COMPLEXITY: Moderate  PLAN:  PT FREQUENCY: 2x/week  PT DURATION: 12 weeks  PLANNED INTERVENTIONS: 97110-Therapeutic exercises, 97530- Therapeutic activity, [id number]- Neuromuscular re-education, 97535- Self Care,  and [id number]- Manual therapy  PLAN FOR NEXT SESSION: recheck goals    Donavon Fudge, PT, DPT 08/11/2023, 1:55 PM

## 2023-08-11 NOTE — Therapy (Signed)
 OUTPATIENT SPEECH LANGUAGE PATHOLOGY TREATMENT   Patient Name: Patrick Ross MRN: [id number] DOB:December 27, 1945, 78 y.o., male Today's Date: 08/11/2023  PCP: Jayne Mews, MD REFERRING PROVIDER: Shirline Dover, DO  END OF SESSION:  End of Session - 08/11/23 1235     Visit Number 9    Number of Visits 17    Date for SLP Re-Evaluation 09/06/23    SLP Start Time 1232    SLP Stop Time  1310    SLP Time Calculation (min) 38 min    Activity Tolerance Patient tolerated treatment well          Past Medical History:  Diagnosis Date   Depression    DVT (deep venous thrombosis) (HCC)    GERD (gastroesophageal reflux disease)    History of kidney stones    passed   Hypertension    Hypothyroidism    Lumbar stenosis    OSA (obstructive sleep apnea)    wears cpap   Parkinson's disease (HCC)    PONV (postoperative nausea and vomiting)    REM behavioral disorder    Urinary incontinence    Past Surgical History:  Procedure Laterality Date   BACK SURGERY     x 2 - not fusion   BIOPSY  06/01/2022   Procedure: BIOPSY;  Surgeon: Baldo Bonds, MD;  Location: [facility];  Service: Gastroenterology;;   COLONOSCOPY     DEEP BRAIN STIMULATOR PLACEMENT     left 12/27/2012, right 12/10/2011, left revision 02/02/2013, replacement bilateral 11/21/2015   ESOPHAGOGASTRODUODENOSCOPY (EGD) WITH PROPOFOL  N/A 06/01/2022   Procedure: ESOPHAGOGASTRODUODENOSCOPY (EGD) WITH PROPOFOL ;  Surgeon: Baldo Bonds, MD;  Location: [facility];  Service: Gastroenterology;  Laterality: N/A;   MINOR PLACEMENT OF FIDUCIAL N/A 01/09/2019   Procedure: Fiducial placement;  Surgeon: Manya Sells, MD;  Location: [facility] OR;  Service: Neurosurgery;  Laterality: N/A;  Fiducial placement   PARTIAL NEPHRECTOMY Right 2005 ish   PULSE GENERATOR IMPLANT Right 01/23/2019   Procedure: Right chest implantable pulse generator change;  Surgeon: Manya Sells, MD;  Location: [facility] OR;  Service: Neurosurgery;  Laterality: Right;    SPINAL FUSION     lumbar Fusion x 2   SUBTHALAMIC STIMULATOR BATTERY REPLACEMENT Left 02/02/2018   Procedure: Change implantable pulse generator battery, Left chest;  Surgeon: Manya Sells, MD;  Location: [facility] OR;  Service: Neurosurgery;  Laterality: Left;   SUBTHALAMIC STIMULATOR BATTERY REPLACEMENT N/A 01/22/2020   Procedure: REMOVAL OF LEFT BRAIN DEEP BRAIN STIMULATOR ELECTRODE, EXTENSION, AND PULSE GENERATOR;  Surgeon: Manya Sells, MD;  Location: [facility] OR;  Service: Neurosurgery;  Laterality: N/A;   SUBTHALAMIC STIMULATOR BATTERY REPLACEMENT Right 02/23/2022   Procedure: CHANGE IPG BATTERY, RT CHEST;  Surgeon: Dawley, Colby Daub, DO;  Location: [facility] OR;  Service: Neurosurgery;  Laterality: Right;   SUBTHALAMIC STIMULATOR INSERTION Left 01/16/2019   Procedure: Left deep brain stimulator electrode revision.;  Surgeon: Manya Sells, MD;  Location: [facility] OR;  Service: Neurosurgery;  Laterality: Left;   SUBTHALAMIC STIMULATOR INSERTION Left 01/23/2019   Procedure: Repositioning of Left Deep Brain Stimulator Electrode;  Surgeon: Manya Sells, MD;  Location: [facility] OR;  Service: Neurosurgery;  Laterality: Left;   TONSILLECTOMY     Patient Active Problem List   Diagnosis Date Noted   Melena 06/01/2022   Acute cystitis without hematuria 05/31/2022   Upper GI bleed 05/31/2022   Generalized weakness 05/30/2022   Abdominal pain 05/30/2022   OSA (obstructive sleep apnea) 05/30/2022   Hypothyroidism 05/30/2022   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)  12/14/2021   Mixed hyperlipidemia 12/14/2021   Essential hypertension 12/14/2021   Palpitations 12/14/2021   Angina pectoris (HCC) 12/14/2021   Urge incontinence 01/15/2021   History of UTI 01/15/2021   Abnormal urine findings 01/15/2021   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   History of nephrolithiasis 01/15/2021   DVT, lower extremity, proximal, acute, right (HCC) 02/26/2020   Open scalp wound 01/22/2020   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HCC)     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   Parkinson's disease (HCC) 01/16/2018   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  Pt accompanied by: significant other; [name]  PERTINENT HISTORY: PD (hx of STN DBS), depression, sleep apnea  PAIN:  Are you having pain? Yes: NPRS scale: 2 Pain location: hip Pain description: chronic Aggravating factors: sitting up Relieving factors: N/A  FALLS: Has patient fallen in last 6 months?  Yes  LIVING ENVIRONMENT: Lives with: lives with their family; 1 Lives in: House/apartment  PLOF:  Level of assistance: Independent with ADLs, Independent with IADLs Employment: Retired  PATIENT GOALS: voice, cognition  OBJECTIVE:  Note: Objective measures were completed at Evaluation unless otherwise noted.  DIAGNOSTIC FINDINGS: No imaging on this EMR since 2021 CT Head.   COGNITION: Overall cognitive status: Impaired Areas of impairment:  Attention: Impaired: Selective, Alternating, Divided Memory: Impaired: Short term Prospective Executive function: Impaired: Organization and Planning Functional deficits: Difficulty with recall of details  MOTOR SPEECH: Overall motor speech: impaired Level of impairment: Conversation Respiration: Reduced support Phonation: normal Resonance: WFL Articulation: Impaired: conversation Intelligibility: Intelligibility reduced Motor planning: Appears intact Motor speech errors: NA Interfering components: NA Effective technique: slow rate, increased vocal intensity, and over articulate  ORAL MOTOR  EXAMINATION: Overall status: WFL Comments:    CLINICAL SWALLOW ASSESSMENT:   Current diet: regular and thin liquids Dentition: adequate natural dentition Patient directly observed with POs: Yes: thin liquids  Feeding: able to feed self Liquids provided by: cup and straw Oral phase signs and symptoms: NA Pharyngeal phase signs and symptoms: multiple swallows Comments: [facility] Swallow Protocol PASS   *Pt reports some difficulty with drier foods and occasionally getting strangled with liquids. No observed this date. To monitor.   SOCIAL HISTORY: Occupation: Warden/ranger intake: suboptimal Caffeine/alcohol intake: minimal Daily voice use: minimal  PERCEPTUAL VOICE ASSESSMENT: Voice quality: normal Vocal abuse: NA Resonance: normal Respiratory function: thoracic breathing  LSVT-LOUD VOICE EVALUATION: Maximum phonation time for sustained "ah": 13 sec Mean intensity during sustained "ah": 80 dB  Mean fundamental frequency during sustained "ah": 111 Hz  Mean intensity sustained during conversational speech: 70 dB Habitual pitch: 110 Hz Highest dynamic pitch when altering pitch from a low note to a high note: 175 Hz Lowest dynamic pitch when altering from a high note to a low note: 106 Hz Highest dynamic pitch during conversational speech: 159 Hz Lowest dynamic pitch during conversational speech: 82 Hz Average time patient was able to sustain /s/: 4.8 seconds Average time patient was able to sustain /z/: 13.8 seconds s/z ratio : .34 (indicating suspected respiratory inefficiency)   Speech is characterized by Other: . For trained listener in quiet environment with context, intelligibility was >90%.  Patient able to improve all parameters with model ("Loud like me") Stimulability: Improved vocal quality with loud voice (81 dB) for sustained vowel, functional phrases averaged 78 dB and improved pitch range with model.     The Communication Effectiveness Survey is a patient-reported  outcome measure in which the patient rates their own effectiveness in different communication situations. A higher score indicates greater effectiveness. Pt's self-rating was 27/32. Patient reported mild difficulty conversing with a telephone, conversing in a noisy environment, speaking when emotional, conversing when traveling in a car, having conversation with someone at a distance.   STANDARDIZED ASSESSMENTS: SLUMS: 24/30  PATIENT REPORTED OUTCOME MEASURES (PROM): Cognitive function: Short Form: 34 and Communication Effectiveness Survey: 27                                                                                                                            TREATMENT DATE:   08/11/23: Pt was seen for skilled ST services targeting dysarthria. Pt completed 5 sets of 5 on EMST, as he had not completed his exercises yet today. He required minA verbal cues to complete correctly. SLP demonstrated how to use speech-to-text and notes function on his iPad. Pt reported he would like to write a routine to follow in the morning. We completed morning routines for re: mon/wed/fri and tu/th. SLP also demonstrated how notes can be used to write a daily journal as pt reports he has difficulty recalling things from day to day. To cont next session/  08/09/23: Pt was seen for skilled ST services targeting dysarthria. Pt completed 5 sets of 5 on EMST today. He required min verbal cueing to complete task. Pt has been keeping up with EMST and SPEAK OUT exercises using his template for keeping track of reps/dates. SLP initiated memory strategy education this session. Pt reports he could benefit from several external memory strategies. To bring in phone next session for SLP demonstration of speech-to-text.   08/05/23: Pt was seen for skilled ST services targeting dysarthria. Pt completed 5 sets of 5 on EMST today. He required min-mod verbal cueing to complete task. Pt required SLP to keep count of breaths and  encouragement to pause in between. Pt completed voice exercises with therapist today.   Warm up: 82 dB avg  Reading: 81 dB avg  Cognitive Exercises 79 dB avg   08/02/23: Pt was seen for skilled ST services targeting dysarthria. Pt reports he has completed his EMST today. SLP led pt through warm up exercises re: diaphragmatic breathing; may, me, my, mo, moo; sustained phonation, and pitch glides. SLP instructed pt to project voice (think loud; speak over the fence) during warm up. Pt read instructions for SPEAK OUT and performed exercises with minA. He required occasional verbal cues to continue with intent for entirety of reading exercise vs trailing off. Pt participated in structured conversation with consistent cues for increasing loudness.   07/28/23: Pt was seen for skilled ST services targeting education for wife and  design of templates for EMST, SPEAK OUT, and Medication log due to pt having difficulty remembering to complete exercises. SLP encouraged wife to help patient set up a memory and exercise station with calendar, medication log, and exercise log. Pt completed reading and cognitive portion of SPEAK OUT while SLP designed templates. Provided in clear sheet protector at end of session. Pt to bring EMST next session.   07/21/23: Pt was seen for skilled ST services targeting review of SPEAK OUT. Pt reports he did not complete any voice exercises due to company this week. SLP explained the importance of consistency with exercises for demonstrated improvement. He has not completed his EMST exercises in over a year. SLP reassessed on his personal device. Pt encouraged to set a routine to complete voice and respiratory exercises.    EMST 150 3X5 reps @ 75% of MEP - 30 cmH20 with 15 second rest  (45 cmH20 MEP)   07/12/23: Pt was seen for skilled ST services targeting review of SPEAK OUT. SLP educated on Living with Intent Means To... found in his HEP book. Patient reported that speaking  with conscious effort resonated the most with him and his idea of intent. SLP led patient through vocal warm up re: May-Me-My-Moe-Mo x5, sustained phonation /ah/ x5, pitch glides x5/each. Pt completed cognitive component of SPEAK OUT book independently (up and ___). Pt demonstrated completion of SPEAK OUT  exercises with spv. SLP set pt up with a system to separate his completion from previous therapy course (using pencil and X marks vs. Pen and Check Mark). Pt completed Lesson 1 with therapist for AM portion - to complete PM portion at home this evening.   07/08/23: Pt was seen for skilled ST services targeting continued voice assessment - see above. Pt also completed PROMs for cognitive function and dysarthria. Pt brought in his Speak Out book from previous therapist. SLP encouraged pt to continue with exercises. SLP is not speak out certified, but plans to use similar cues with exemplars from book for continuity of care.  PATIENT EDUCATION: Education details: PD and SLP role  Person educated: Patient and Spouse Education method: Explanation Education comprehension: verbalized understanding   GOALS: Goals reviewed with patient? Yes  SHORT TERM GOALS: Target date: 08/07/23  Pt will complete baseline voice measures Baseline: Goal status:MET  2.  Pt will complete PROMs Baseline:  Goal status: MET  3.  Pt will demonstrate voice HEP independently Baseline:  Goal status: INITIAL  4.  Pt/wife will verbalize 3 memory strategies to improve recall of important information Baseline:  Goal status: INITIAL  5.   Baseline:  Goal status: INITIAL  6.   Baseline:  Goal status: INITIAL  LONG TERM GOALS: Target date: 09/06/23  Pt will improve score on PROMs Baseline:  Goal status: INITIAL  2.  Pt will demonstrate improved vocal intensity with sustained phonation Baseline:  Goal status: INITIAL  3.  Pt will demonstrate improved vocal intensity in 5 min, unstructured  conversation Baseline:  Goal status: INITIAL  4.  Pt/wife will report success using memory strategies at home Baseline:  Goal status: INITIAL  5.   Baseline:  Goal status: INITIAL  6.   Baseline:  Goal status: INITIAL  ASSESSMENT:  CLINICAL IMPRESSION: Pt is a 78 yo male who presents to ST OP for evaluation for sx 2/2 Parkinson's Disease.  Pt endorses changes in vocal intensity, breath support, and cognition re: focus, memory. Pt has received speech therapy before at [facility] ~2 years  ago. Wife reports he has been referred for a neuropsych evaluation for cognition; therefore, SLP to just complete a screen at this time. Pt was assessed using SLUMS - scoring a 24/30. Impairments noted in recall and word generation. Pt with complaints of occasional difficulty with thin liquids and solids. Pt passed YSP, with no overt s/sx of aspiration. Pt reports he has some difficulty with drier solids getting stuck. SLP suggested moister meats re: chicken thighs vs chicken breast, cutting items into smaller pieces, and adding sauces/gravies to drier foods. Unable to complete voice measures due to time constraints. To complete next session. SLP rec skilled ST services to address cognitive-communication impairment, dysarthria, and monitor dysphagia.    OBJECTIVE IMPAIRMENTS: include attention, memory, executive functioning, and dysarthria. These impairments are limiting patient from effectively communicating at home and in community. Factors affecting potential to achieve goals and functional outcome are NA. Patient will benefit from skilled SLP services to address above impairments and improve overall function.  REHAB POTENTIAL: Good  PLAN:  SLP FREQUENCY: 1-2x/week  SLP DURATION: 8 weeks  PLANNED INTERVENTIONS: Diet toleration management , Cueing hierachy, Internal/external aids, Functional tasks, Multimodal communication approach, SLP instruction and feedback, Compensatory strategies,  Patient/family education, and [id number] Treatment of speech (30 or 45 min)     [employer], CCC-SLP 08/11/2023, 12:35 PM

## 2023-08-15 NOTE — Therapy (Signed)
 OUTPATIENT OCCUPATIONAL THERAPY PARKINSON'S EVALUATION  Patient Name: Patrick Ross MRN: [id number] DOB:1945-09-28, 78 y.o., male Today's Date: 08/16/2023  PCP: Dr. Janice Meeter REFERRING PROVIDER:Dr. Tat  END OF SESSION:  OT End of Session - 08/16/23 1348     Visit Number 1    Number of Visits 25    Date for OT Re-Evaluation 11/08/23    Authorization Type MCR, [employer]    Authorization Time Period 12 weeks, anticipate d/c after 8 weeks    Authorization - Visit Number 1    Progress Note Due on Visit 10    OT Start Time 1400    OT Stop Time 1440    OT Time Calculation (min) 40 min    Activity Tolerance Patient tolerated treatment well    Behavior During Therapy WFL for tasks assessed/performed          Past Medical History:  Diagnosis Date   Depression    DVT (deep venous thrombosis) (HCC)    GERD (gastroesophageal reflux disease)    History of kidney stones    passed   Hypertension    Hypothyroidism    Lumbar stenosis    OSA (obstructive sleep apnea)    wears cpap   Parkinson's disease (HCC)    PONV (postoperative nausea and vomiting)    REM behavioral disorder    Urinary incontinence    Past Surgical History:  Procedure Laterality Date   BACK SURGERY     x 2 - not fusion   BIOPSY  06/01/2022   Procedure: BIOPSY;  Surgeon: Baldo Bonds, MD;  Location: [facility];  Service: Gastroenterology;;   COLONOSCOPY     DEEP BRAIN STIMULATOR PLACEMENT     left 12/27/2012, right 12/10/2011, left revision 02/02/2013, replacement bilateral 11/21/2015   ESOPHAGOGASTRODUODENOSCOPY (EGD) WITH PROPOFOL  N/A 06/01/2022   Procedure: ESOPHAGOGASTRODUODENOSCOPY (EGD) WITH PROPOFOL ;  Surgeon: Baldo Bonds, MD;  Location: [facility];  Service: Gastroenterology;  Laterality: N/A;   MINOR PLACEMENT OF FIDUCIAL N/A 01/09/2019   Procedure: Fiducial placement;  Surgeon: Manya Sells, MD;  Location: [facility] OR;  Service: Neurosurgery;  Laterality: N/A;  Fiducial placement   PARTIAL  NEPHRECTOMY Right 2005 ish   PULSE GENERATOR IMPLANT Right 01/23/2019   Procedure: Right chest implantable pulse generator change;  Surgeon: Manya Sells, MD;  Location: [facility] OR;  Service: Neurosurgery;  Laterality: Right;   SPINAL FUSION     lumbar Fusion x 2   SUBTHALAMIC STIMULATOR BATTERY REPLACEMENT Left 02/02/2018   Procedure: Change implantable pulse generator battery, Left chest;  Surgeon: Manya Sells, MD;  Location: [facility] OR;  Service: Neurosurgery;  Laterality: Left;   SUBTHALAMIC STIMULATOR BATTERY REPLACEMENT N/A 01/22/2020   Procedure: REMOVAL OF LEFT BRAIN DEEP BRAIN STIMULATOR ELECTRODE, EXTENSION, AND PULSE GENERATOR;  Surgeon: Manya Sells, MD;  Location: [facility] OR;  Service: Neurosurgery;  Laterality: N/A;   SUBTHALAMIC STIMULATOR BATTERY REPLACEMENT Right 02/23/2022   Procedure: CHANGE IPG BATTERY, RT CHEST;  Surgeon: Dawley, Colby Daub, DO;  Location: [facility] OR;  Service: Neurosurgery;  Laterality: Right;   SUBTHALAMIC STIMULATOR INSERTION Left 01/16/2019   Procedure: Left deep brain stimulator electrode revision.;  Surgeon: Manya Sells, MD;  Location: [facility] OR;  Service: Neurosurgery;  Laterality: Left;   SUBTHALAMIC STIMULATOR INSERTION Left 01/23/2019   Procedure: Repositioning of Left Deep Brain Stimulator Electrode;  Surgeon: Manya Sells, MD;  Location: [facility] OR;  Service: Neurosurgery;  Laterality: Left;   TONSILLECTOMY     Patient Active Problem List   Diagnosis Date Noted   Melena 06/01/2022  Acute cystitis without hematuria 05/31/2022   Upper GI bleed 05/31/2022   Generalized weakness 05/30/2022   Abdominal pain 05/30/2022   OSA (obstructive sleep apnea) 05/30/2022   Hypothyroidism 05/30/2022   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) 12/14/2021   Mixed hyperlipidemia 12/14/2021   Essential hypertension 12/14/2021   Palpitations 12/14/2021   Angina pectoris (HCC) 12/14/2021   Urge incontinence 01/15/2021   History of UTI 01/15/2021   Abnormal urine  findings 01/15/2021   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   History of nephrolithiasis 01/15/2021   DVT, lower extremity, proximal, acute, right (HCC) 02/26/2020   Open scalp wound 01/22/2020   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HCC)    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   Parkinson's disease (HCC) 01/16/2018   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) 01/16/2018   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    ONSET DATE: 07/28/23- referral date  REFERRING DIAG:  Diagnosis  Z74.1 (ICD-10-CM) - Requires assistance with activities of daily living (ADL)  G20.B2 (ICD-10-CM) - Parkinson's disease with dyskinesia and fluctuating manifestations (HCC)    THERAPY DIAG:  Other lack of coordination  Other abnormalities of gait and mobility  Rationale for Evaluation and Treatment: Rehabilitation  SUBJECTIVE:   SUBJECTIVE STATEMENT: Pt reportshe's not doing as well Pt accompanied by: self  PERTINENT HISTORY: parkinson's diagnosed greater than 10 yrs ago, deep brain stimulator R, had in L as well but had to be removed for various reasons. Depression, sleep apnea, hx of back surgery, see hx above walks around neighborhood 3 x week with aide Tuesdays and Thursdays parkisons bicycle class 2 x Lynelle Sara training for upper body work out   PRECAUTIONS: Fall- PT is addressing  WEIGHT BEARING RESTRICTIONS: No  PAIN:  Are you having pain? Yes: NPRS scale: 2-3/10 Pain location: right hip Pain description: aching Aggravating factors: getting up from low surface Relieving factors: unknown OT will monitor but not address directly  FALLS: Has patient fallen in last 6 months? No  LIVING ENVIRONMENT: Lives with: lives with their spouse, has CNA 3x weeks Lives in: House/apartment Stairs: Yes: Internal: has stairt lift, and coverted garage appt on ground level Has following equipment at home: U-step  PLOF: Needs assistance with ADLs and Needs assistance with homemaking  PATIENT GOALS: Pt wants to be able to tie  shoes, and more I with dressing  OBJECTIVE:  Note: Objective measures were completed at Evaluation unless otherwise noted.  HAND DOMINANCE: Right  ADLs: Overall ADLs: increased time required  Transfers/ambulation related to ADLs: Eating: difficulty cutting food, needs assist  Grooming: mod I with brushing teeth, combing, CNA assists with shaving UB Dressing: mod I LB Dressing: mod-max A for LB dressing Toileting: mod I with U-step Bathing: supervision-walk in shower, level has built in seat and grab bars Tub Shower transfers: min A Equipment: Grab bars and level walk in shower with built in bench  IADLs:Dependent with home management  Medication management: wife assists Financial management: wife handles Handwriting: Moderate micrographia at sentence level  MOBILITY STATUS: mod I with U-step  POSTURE COMMENTS:  rounded shoulders and forward head   FUNCTIONAL OUTCOME MEASURES: Fastening/unfastening 3 buttons: 1 min 43 secs   COORDINATION: 9 Hole Peg test: Right: 55.08 sec; Left: 49.17 sec Box and Blocks:  Right 26blocks, Left 34blocks  UE ROM:  A/ROM:shoulder flexion RUE-100 LUE105, elbow extension RUE-35 LUE -25, decreased bilateral supination, RUE 95% finger extension, maintains fingers flexed    SENSATION: WFL    COGNITION: Overall cognitive status: Impaired, slowed processing and short term memory  deficits  OBSERVATIONS: Bradykinesia and resting tremor RUE, dystonia RUE                                                                                                                    TREATMENT DATE: 08/16/23- eval only    PATIENT EDUCATION: Education details: role of OT and potential goals Person educated: Patient Education method: Explanation Education comprehension: verbalized understanding  HOME EXERCISE PROGRAM: n/a  GOALS: Goals reviewed with patient? Yes  SHORT TERM GOALS: Target date: 09/14/23   I with PD specific HEP  Goal status:  INITIAL  2.  Pt will demonstrate understanding of adapted strategies/ AE to increase safety and I with ADLs/IADLs. Goal status: INITIAL  3.  Pt will demonstrate improved bilateral UE use as evidenced by increasing box and blocks score by 3 blocks bilaterally.   Goal status: INITIAL  4.  Pt will demonstrate improved bilateral UE functional use and ease with dressing as evidenced by decreasing 3 button/ unbutton test time by 5 secs.  Goal status: INITIAL  5.  Pt will demonstrate ability to retrieve a lightweight object at 105 shoulder flexion and -30 elbow extension with RUE.  Goal status: INITIAL  6.  Pt will demonstrate ability to retrieve a lightweight object at 110 shoulder flexion and -20 elbow extension with LUE  Goal status: INITIAL  LONG TERM GOALS: Target date: 11/08/23   Pt will demonstrate improved fine motor coordination for ADLs as evidenced by decreasing 9 hole peg test score for RUE by 4 secs  Goal status: INITIAL  2.  Pt will demonstrate improved fine motor coordination for ADLs as evidenced by decreasing 9 hole peg test score for LUE by 4 secs  Goal status: INITIAL   3.. Check PPT#4 and set goal prn          Goal status; inital   4.  Pt will verbalize understanding of ways to prevent future PD related complications  Goal status: INITIAL  5.  Pt will write a short paragraph with 100% legibility and minimal decrease in letter size  Goal status: INITIAL  6.  check PPT#2  set goal prn Goal status: INITIAL  ASSESSMENT:  CLINICAL IMPRESSION: Patient is a 78 y.o. male who was seen today for occupational therapy evaluation for Parkinson's disease. Pt reports a decline in ADL performance and fine motor coordination. Pt presents with the performance deficits below. He can benefit from skilled occupational therapy to address these deficits in order to maximize pt's safety and I with ADLs/ IADLS.   PERFORMANCE DEFICITS: in functional skills including ADLs, IADLs,  coordination, dexterity, tone, ROM, strength, flexibility, Fine motor control, Gross motor control, mobility, balance, endurance, decreased knowledge of precautions, decreased knowledge of use of DME, vision, and UE functional use, cognitive skills including attention, memory, problem solving, safety awareness, sequencing, and thought, and psychosocial skills including coping strategies, environmental adaptation, habits, interpersonal interactions, and routines and behaviors.   IMPAIRMENTS: are limiting patient from ADLs, IADLs, rest and sleep, play, leisure,  and social participation.   COMORBIDITIES:  may have co-morbidities  that affects occupational performance. Patient will benefit from skilled OT to address above impairments and improve overall function.  MODIFICATION OR ASSISTANCE TO COMPLETE EVALUATION: Min-Moderate modification of tasks or assist with assess necessary to complete an evaluation.  OT OCCUPATIONAL PROFILE AND HISTORY: Detailed assessment: Review of records and additional review of physical, cognitive, psychosocial history related to current functional performance.  CLINICAL DECISION MAKING: Moderate - several treatment options, min-mod task modification necessary  REHAB POTENTIAL: Good  EVALUATION COMPLEXITY: Low    PLAN:  OT FREQUENCY: 2x/week  OT DURATION: 12 weeks  PLANNED INTERVENTIONS: 97168 OT Re-evaluation, 97535 self care/ADL training, 54098 therapeutic exercise, 97530 therapeutic activity, 97112 neuromuscular re-education, 97140 manual therapy, 97116 gait training, 11914 aquatic therapy, 97035 ultrasound, 97018 paraffin, 78295 moist heat, 97010 cryotherapy, 97034 contrast bath, 97129 Cognitive training (first 15 min), 62130 Cognitive training(each additional 15 min), passive range of motion, balance training, functional mobility training, visual/perceptual remediation/compensation, energy conservation, coping strategies training, patient/family education, and  DME and/or AE instructions  RECOMMENDED OTHER SERVICES: n/a  CONSULTED AND AGREED WITH PLAN OF CARE: Patient  PLAN FOR NEXT SESSION: initial HEP, PWR!, coordination assess PPT#2, and #4   Zerina Hallinan, OT 08/16/2023, 4:36 PM

## 2023-08-16 ENCOUNTER — Encounter: Payer: Self-pay | Admitting: Speech Pathology

## 2023-08-16 ENCOUNTER — Encounter: Payer: Self-pay | Admitting: Occupational Therapy

## 2023-08-16 ENCOUNTER — Ambulatory Visit: Admitting: Occupational Therapy

## 2023-08-16 ENCOUNTER — Ambulatory Visit: Admitting: Speech Pathology

## 2023-08-16 ENCOUNTER — Ambulatory Visit

## 2023-08-16 DIAGNOSIS — R278 Other lack of coordination: Secondary | ICD-10-CM

## 2023-08-16 DIAGNOSIS — G20B2 Parkinson's disease with dyskinesia, with fluctuations: Secondary | ICD-10-CM

## 2023-08-16 DIAGNOSIS — R262 Difficulty in walking, not elsewhere classified: Secondary | ICD-10-CM

## 2023-08-16 DIAGNOSIS — R2689 Other abnormalities of gait and mobility: Secondary | ICD-10-CM

## 2023-08-16 DIAGNOSIS — R41844 Frontal lobe and executive function deficit: Secondary | ICD-10-CM

## 2023-08-16 DIAGNOSIS — R29818 Other symptoms and signs involving the nervous system: Secondary | ICD-10-CM

## 2023-08-16 DIAGNOSIS — R471 Dysarthria and anarthria: Secondary | ICD-10-CM

## 2023-08-16 DIAGNOSIS — R41840 Attention and concentration deficit: Secondary | ICD-10-CM

## 2023-08-16 DIAGNOSIS — R41841 Cognitive communication deficit: Secondary | ICD-10-CM

## 2023-08-16 NOTE — Therapy (Signed)
 OUTPATIENT PHYSICAL THERAPY NEURO TREATMENT   Patient Name: Patrick Ross MRN: [id number] DOB:[birth year], 78 y.o., male Today's Date: 08/16/2023   PCP: Jayne Mews MD REFERRING PROVIDER: Shirline Dover DO  END OF SESSION:  PT End of Session - 08/16/23 1310     Visit Number 8    Authorization Type Medicare    PT Start Time 1315    PT Stop Time 1400    PT Time Calculation (min) 45 min                 Past Medical History:  Diagnosis Date   Depression    DVT (deep venous thrombosis) (HCC)    GERD (gastroesophageal reflux disease)    History of kidney stones    passed   Hypertension    Hypothyroidism    Lumbar stenosis    OSA (obstructive sleep apnea)    wears cpap   Parkinson's disease (HCC)    PONV (postoperative nausea and vomiting)    REM behavioral disorder    Urinary incontinence    Past Surgical History:  Procedure Laterality Date   BACK SURGERY     x 2 - not fusion   BIOPSY  06/01/2022   Procedure: BIOPSY;  Surgeon: Baldo Bonds, MD;  Location: [facility];  Service: Gastroenterology;;   COLONOSCOPY     DEEP BRAIN STIMULATOR PLACEMENT     left 12/27/2012, right 12/10/2011, left revision 02/02/2013, replacement bilateral 11/21/2015   ESOPHAGOGASTRODUODENOSCOPY (EGD) WITH PROPOFOL  N/A 06/01/2022   Procedure: ESOPHAGOGASTRODUODENOSCOPY (EGD) WITH PROPOFOL ;  Surgeon: Baldo Bonds, MD;  Location: [facility];  Service: Gastroenterology;  Laterality: N/A;   MINOR PLACEMENT OF FIDUCIAL N/A 01/09/2019   Procedure: Fiducial placement;  Surgeon: Manya Sells, MD;  Location: [facility] OR;  Service: Neurosurgery;  Laterality: N/A;  Fiducial placement   PARTIAL NEPHRECTOMY Right 2005 ish   PULSE GENERATOR IMPLANT Right 01/23/2019   Procedure: Right chest implantable pulse generator change;  Surgeon: Manya Sells, MD;  Location: [facility] OR;  Service: Neurosurgery;  Laterality: Right;   SPINAL FUSION     lumbar Fusion x 2   SUBTHALAMIC STIMULATOR BATTERY  REPLACEMENT Left 02/02/2018   Procedure: Change implantable pulse generator battery, Left chest;  Surgeon: Manya Sells, MD;  Location: [facility] OR;  Service: Neurosurgery;  Laterality: Left;   SUBTHALAMIC STIMULATOR BATTERY REPLACEMENT N/A 01/22/2020   Procedure: REMOVAL OF LEFT BRAIN DEEP BRAIN STIMULATOR ELECTRODE, EXTENSION, AND PULSE GENERATOR;  Surgeon: Manya Sells, MD;  Location: [facility] OR;  Service: Neurosurgery;  Laterality: N/A;   SUBTHALAMIC STIMULATOR BATTERY REPLACEMENT Right 02/23/2022   Procedure: CHANGE IPG BATTERY, RT CHEST;  Surgeon: Dawley, Colby Daub, DO;  Location: [facility] OR;  Service: Neurosurgery;  Laterality: Right;   SUBTHALAMIC STIMULATOR INSERTION Left 01/16/2019   Procedure: Left deep brain stimulator electrode revision.;  Surgeon: Manya Sells, MD;  Location: [facility] OR;  Service: Neurosurgery;  Laterality: Left;   SUBTHALAMIC STIMULATOR INSERTION Left 01/23/2019   Procedure: Repositioning of Left Deep Brain Stimulator Electrode;  Surgeon: Manya Sells, MD;  Location: [facility] OR;  Service: Neurosurgery;  Laterality: Left;   TONSILLECTOMY     Patient Active Problem List   Diagnosis Date Noted   Melena 06/01/2022   Acute cystitis without hematuria 05/31/2022   Upper GI bleed 05/31/2022   Generalized weakness 05/30/2022   Abdominal pain 05/30/2022   OSA (obstructive sleep apnea) 05/30/2022   Hypothyroidism 05/30/2022   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) 12/14/2021   Mixed hyperlipidemia 12/14/2021   Essential hypertension  12/14/2021   Palpitations 12/14/2021   Angina pectoris (HCC) 12/14/2021   Urge incontinence 01/15/2021   History of UTI 01/15/2021   Abnormal urine findings 01/15/2021   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   History of nephrolithiasis 01/15/2021   DVT, lower extremity, proximal, acute, right (HCC) 02/26/2020   Open scalp wound 01/22/2020   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HCC)    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   Parkinson's disease (HCC)  01/16/2018   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) 01/16/2018   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    ONSET DATE: exacerbation 06/21/23, Parkinson's diagnosis 2010  REFERRING DIAG: parkinsons disease with fluctuating dyskinesia  THERAPY DIAG:  Parkinson's disease with dyskinesia, with fluctuations (HCC)  Difficulty in walking, not elsewhere classified  Other lack of coordination  Rationale for Evaluation and Treatment: Rehabilitation  SUBJECTIVE:                                                                                                                                                                                             SUBJECTIVE STATEMENT: Past 2 days have been rough because of the Parkinson's. The curling of the toes and hands. I only went 2 miles today in cycling.    EVAL-Pt and his wife report long history of parkinson's,  biggest difficulty with his mobility that they note is R lower back  pain and labored supine to sit movement, also poor foot clearance.  He doesn't like relying on the walker Pt accompanied by: significant other  PERTINENT HISTORY: parkinson's diagnosed greater than 10 yrs ago, deep brain stimulator R, had in L as well but had to be removed for various reasons.  walks around neighborhood 3 x week with aide Tuesdays and Thursdays parkisons bicycle class 2 x Lynelle Sara training for upper body work out Referred to PT and ST by neurologist   PAIN:  Are you having pain? R lower back pain constant  PRECAUTIONS: Fall and Other: deep brain stimulator on R   RED FLAGS: None   WEIGHT BEARING RESTRICTIONS: No  FALLS: Has patient fallen in last 6 months? No  LIVING ENVIRONMENT: Lives with: lives with their spouse Lives in: House/apartment Stairs: has internal steps, but has had home renovated and adapted so the he and his wife live in downstairs apartment, all one level with bathroom and kitchen Has following equipment at home: Single point cane, Environmental consultant  - 4 wheeled, Tour manager, Grab bars, and Ramped entry  PLOF: Independent  PATIENT GOALS: improve bed mobility and improve gait, stability, avoid falls  OBJECTIVE:  Note: Objective measures were completed at Evaluation unless otherwise noted.  DIAGNOSTIC FINDINGS: see neuro notes,  has deep brain stimulator R   COGNITION: Overall cognitive status: Within functional limits for tasks assessed   SENSATION: WFL  COORDINATION: Slowed movements B and noted curling R hand and R foot at times, with gait festinating on turns   EDEMA:  None noted  MUSCLE TONE: noted curling R fingers, toes, knee flex and hip flex at times, randomly during evaluation  MUSCLE LENGTH: Hamstrings: Right -30 deg; Left -30 deg Thomas test: Right nt deg; Left nt deg    POSTURE: Standing with some forward lean, weight on forefeet, L calf atrophied pt and wife report chronic after L lumbar surgery.     LOWER EXTREMITY ROM:   grossly wfl, hips not assessed as not positioned on mat today   LOWER EXTREMITY MMT:    MMT Right Eval Left Eval  Hip flexion 4 4-  Hip extension    Hip abduction    Hip adduction    Hip internal rotation    Hip external rotation    Knee flexion 4 4  Knee extension 4 4-  Ankle dorsiflexion 4 3+  Ankle plantarflexion 4- 3-  Ankle inversion    Ankle eversion    (Blank rows = not tested)  BED MOBILITY:  Not tested  TRANSFERS: I but slowed, uses hands on seat or with rollator  GAIT: Findings: Gait Characteristics: step to pattern, decreased step length- Right, decreased step length- Left, decreased stride length, decreased hip/knee flexion- Right, decreased hip/knee flexion- Left, shuffling, festinating, trunk flexed, and narrow BOS, Distance walked: 100', Assistive device utilized:Walker - 4 wheeled, Level of assistance: SBA, and Comments: festinating noted on turns, tends to turn L  FUNCTIONAL TESTS:  30 seconds chair stand test 6 reps Timed up and go (TUG): 33.39  with rollator Standing static feet together 15 sec Standing semi tandem 10 sec each leg, needed CGA with L foot forward Standing marching, required B hand hold support to maintain balance                                                                                                                              TREATMENT DATE: 08/16/23 Holding big red ball tapping floor to overhead to side to side x5 Standing shoulder rows and ext green 2x10 Standing shoulder flexion with 2# WaTE 2x10 Box taps 4 holding on and then without walker CGA-minA  NuStep L5x90mins STS 2x10   08/11/23 NuStep L5x90mins  Fitter pushes 2 blue bands 2x10  Side steps over obstacles in bars  On airex reaching for numbers  Isometric ab push downs 2x10   08/09/23 NuStep L5x63mins  STS with chest press red ball 2x8 Standing march 3#  3# hip abd 2x10  Horizontal abd green 2x10 Leg ext 10# 2x10 HS curls 25# 2x10    08/04/23 30s chair test TUG 27s  MMT 4/5 Standing shoulder ext and rows green band 2x10 Standing shoulder flexion 2# WaTE x10 - stopped due to feeling dizzy and  off balance  NuStep L5 x92mins  Seated rotation with yellow ball 2x10   08/02/23 NuStep L5x49mins  Side steps over obstacles in bars Forwards steps over obstacles in bars Calf raises x10 Box taps 4 1HRA Step ups 4 2HRA  STS with OHP 2x5  07/28/23 Supine HS stretch and SKTC  Lower trunk rotations x10 SLR 2x10 Feet on pball rotations and knees to chest AB isometric holds x10  NuStep L5x35mins  STS 3x5   07/27/23 NuStep L5x21mins  LAQ 2# 2x10 HS curls green 2x10  Hip abduction green 2x10 STS 2x10  Stretching with pball rotations and flexion x5     07/06/23 Evaluation, advised to attempt prone lying on bed 10 to 15 min  to stretch body into extension( pt doesn't ever lie prone)   PATIENT EDUCATION: Education details: POC, goals Person educated: Patient and Spouse Education method: Explanation, Demonstration, Tactile cues, and  Verbal cues Education comprehension: verbalized understanding, verbal cues required, and needs further education  HOME EXERCISE PROGRAM: Access Code: [email or web address] URL: [email or web address] Date: 08/02/2023 Prepared by: Donavon Fudge  Exercises - Supine Hamstring Stretch with Strap  - 2 x daily - 7 x weekly - 2 reps - 15 hold - Modified Thomas Stretch  - 2 x daily - 7 x weekly - 2 reps - 15 hold - Supine Bridge  - 1 x daily - 7 x weekly - 2 sets - 10 reps - Standing Shoulder Horizontal Abduction with Resistance  - 1 x daily - 7 x weekly - 2 sets - 10 reps  GOALS: Goals reviewed with patient? Yes  SHORT TERM GOALS: Target date: 4 weeks, 08/03/23  I home program for posture and for hamstring, hip flexor stretchig Baseline: Goal status: ongoing 08/02/23  LONG TERM GOALS: Target date: 09/28/23: 12 weeks   30 sec sit to stand improve from 6 reps to 12 reps for improved coordination, strength, endurance LEs Baseline:  Goal status: 7 reps ongoing 08/04/23  2.  I and pain free supine to sit motion , rolling to R Baseline: very painful per pt and wife history, not specifically assessed in clinic today Goal status: ongoing 07/28/23, ongoing 08/09/23  3.  TUG score improve from 33 sec to 18 or less for reduced fall risk   Baseline:  Goal status: 27s ongoing 08/04/23  4.  Improve strength LE's for B quads, hamstrings to 4+5 for improved functional strength Baseline: 4- Goal status: 4/5 08/04/23   ASSESSMENT:  CLINICAL IMPRESSION: Patient reports he feels his Parkinson's is acting up and still has toe and finger curling and increased tremors. . Continued to work on big movements and strengthening. Box taps are difficult without holding on for support, he did a few reps without holding on to walker with close guard by PT. Initiating STS is hard but once he gets going he does well. Will continue to progress to improve his overall function.    EVAL Patient is a 78 y.o. male who was  evaluated today by physical therapy due to referral from his neurologist, with decline in function, particularly control of R extremities, with his Parkinson's diagnosis.  He and his wife have been proactive about maintaining his function, he attends a training/ strengthening session locally 2 x week , attends parkinson's cycling weekly, and walks around 0.6 miles with his aide 3 x week outdoors with his rollator.  They recently have adapted their home with a main floor renovation so that their bedroom, bath, kitchen are all on one level.  Main problems identified today are weakness B LE's, L ankle is weak chronically due to lumbar radiculopathy and B knee, hips musculature is weak.  He also has coordination deficits with festinating gait particularly on turns and static / dynamic balance deficits.  Has chronic R sided lower back pain which is particularly intense with moving supine to sit.  He should benefit from physical therapy intervention to address his deficits and update his current routine for stretching and strengthening at home.    OBJECTIVE IMPAIRMENTS: Abnormal gait, decreased activity tolerance, decreased balance, decreased coordination, decreased endurance, decreased mobility, difficulty walking, decreased strength, hypomobility, impaired perceived functional ability, impaired UE functional use, postural dysfunction, and pain.   ACTIVITY LIMITATIONS: carrying, lifting, squatting, stairs, transfers, and locomotion level  PARTICIPATION LIMITATIONS: meal prep, cleaning, laundry, shopping, community activity, and church  PERSONAL FACTORS: Age, Behavior pattern, Fitness, Past/current experiences, Time since onset of injury/illness/exacerbation, and 1-2 comorbidities: h/o L lumbar radiculopathy and weakness L LE, chronic R sided lower back pain, are also affecting patient's functional outcome.   REHAB POTENTIAL: Good  CLINICAL DECISION MAKING: Evolving/moderate complexity  EVALUATION  COMPLEXITY: Moderate  PLAN:  PT FREQUENCY: 2x/week  PT DURATION: 12 weeks  PLANNED INTERVENTIONS: 97110-Therapeutic exercises, 97530- Therapeutic activity, [id number]- Neuromuscular re-education, 97535- Self Care, and [id number]- Manual therapy  PLAN FOR NEXT SESSION: recheck goals    Donavon Fudge, PT, DPT 08/16/2023, 1:55 PM

## 2023-08-16 NOTE — Therapy (Addendum)
 OUTPATIENT SPEECH LANGUAGE PATHOLOGY TREATMENT   Patient Name: Patrick Ross MRN: [id number] DOB:[birth year], 78 y.o., male Today's Date: 08/16/2023  PCP: Jayne Mews, MD REFERRING PROVIDER: Shirline Dover, DO  Speech Therapy Progress Note  Dates of Reporting Period: 07/06/23 to present  Subjective Statement: Pt reports improvement in voice. Has not been as consistent with other cognitive tasks.   Objective: See previous tx notes  Goal Update: Making progress towards goals.   Plan: Cont with skilled services  Reason Skilled Services are Required: to maximize functional communication   END OF SESSION:  End of Session - 08/16/23 1234     Visit Number 10    Number of Visits 17    Date for SLP Re-Evaluation 09/06/23    SLP Start Time 1230    SLP Stop Time  1310    SLP Time Calculation (min) 40 min    Activity Tolerance Patient tolerated treatment well          Past Medical History:  Diagnosis Date   Depression    DVT (deep venous thrombosis) (HCC)    GERD (gastroesophageal reflux disease)    History of kidney stones    passed   Hypertension    Hypothyroidism    Lumbar stenosis    OSA (obstructive sleep apnea)    wears cpap   Parkinson's disease (HCC)    PONV (postoperative nausea and vomiting)    REM behavioral disorder    Urinary incontinence    Past Surgical History:  Procedure Laterality Date   BACK SURGERY     x 2 - not fusion   BIOPSY  06/01/2022   Procedure: BIOPSY;  Surgeon: Baldo Bonds, MD;  Location: [facility];  Service: Gastroenterology;;   COLONOSCOPY     DEEP BRAIN STIMULATOR PLACEMENT     left 12/27/2012, right 12/10/2011, left revision 02/02/2013, replacement bilateral 11/21/2015   ESOPHAGOGASTRODUODENOSCOPY (EGD) WITH PROPOFOL  N/A 06/01/2022   Procedure: ESOPHAGOGASTRODUODENOSCOPY (EGD) WITH PROPOFOL ;  Surgeon: Baldo Bonds, MD;  Location: [facility];  Service: Gastroenterology;  Laterality: N/A;   MINOR PLACEMENT OF FIDUCIAL  N/A 01/09/2019   Procedure: Fiducial placement;  Surgeon: Manya Sells, MD;  Location: [facility] OR;  Service: Neurosurgery;  Laterality: N/A;  Fiducial placement   PARTIAL NEPHRECTOMY Right 2005 ish   PULSE GENERATOR IMPLANT Right 01/23/2019   Procedure: Right chest implantable pulse generator change;  Surgeon: Manya Sells, MD;  Location: [facility] OR;  Service: Neurosurgery;  Laterality: Right;   SPINAL FUSION     lumbar Fusion x 2   SUBTHALAMIC STIMULATOR BATTERY REPLACEMENT Left 02/02/2018   Procedure: Change implantable pulse generator battery, Left chest;  Surgeon: Manya Sells, MD;  Location: [facility] OR;  Service: Neurosurgery;  Laterality: Left;   SUBTHALAMIC STIMULATOR BATTERY REPLACEMENT N/A 01/22/2020   Procedure: REMOVAL OF LEFT BRAIN DEEP BRAIN STIMULATOR ELECTRODE, EXTENSION, AND PULSE GENERATOR;  Surgeon: Manya Sells, MD;  Location: [facility] OR;  Service: Neurosurgery;  Laterality: N/A;   SUBTHALAMIC STIMULATOR BATTERY REPLACEMENT Right 02/23/2022   Procedure: CHANGE IPG BATTERY, RT CHEST;  Surgeon: Dawley, Colby Daub, DO;  Location: [facility] OR;  Service: Neurosurgery;  Laterality: Right;   SUBTHALAMIC STIMULATOR INSERTION Left 01/16/2019   Procedure: Left deep brain stimulator electrode revision.;  Surgeon: Manya Sells, MD;  Location: [facility] OR;  Service: Neurosurgery;  Laterality: Left;   SUBTHALAMIC STIMULATOR INSERTION Left 01/23/2019   Procedure: Repositioning of Left Deep Brain Stimulator Electrode;  Surgeon: Manya Sells, MD;  Location: [facility] OR;  Service: Neurosurgery;  Laterality: Left;  TONSILLECTOMY     Patient Active Problem List   Diagnosis Date Noted   Melena 06/01/2022   Acute cystitis without hematuria 05/31/2022   Upper GI bleed 05/31/2022   Generalized weakness 05/30/2022   Abdominal pain 05/30/2022   OSA (obstructive sleep apnea) 05/30/2022   Hypothyroidism 05/30/2022   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) 12/14/2021   Mixed hyperlipidemia 12/14/2021   Essential  hypertension 12/14/2021   Palpitations 12/14/2021   Angina pectoris (HCC) 12/14/2021   Urge incontinence 01/15/2021   History of UTI 01/15/2021   Abnormal urine findings 01/15/2021   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   History of nephrolithiasis 01/15/2021   DVT, lower extremity, proximal, acute, right (HCC) 02/26/2020   Open scalp wound 01/22/2020   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HCC)    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   Parkinson's disease (HCC) 01/16/2018   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 Yes: NPRS scale: 2 Pain location: hip Pain description: chronic Aggravating factors: sitting up Relieving factors: N/A  FALLS: Has patient fallen in last 6 months?  Yes  LIVING ENVIRONMENT: Lives with: lives with their family; 1 Lives in: House/apartment  PLOF:  Level of assistance: Independent with ADLs, Independent with IADLs Employment: Retired  PATIENT GOALS: voice, cognition  OBJECTIVE:  Note: Objective measures were completed at Evaluation unless otherwise noted.  DIAGNOSTIC FINDINGS: No imaging on this EMR since 2021 CT Head.   COGNITION: Overall cognitive status: Impaired Areas of impairment:  Attention: Impaired: Selective, Alternating, Divided Memory: Impaired: Short term Prospective Executive function: Impaired: Organization and Planning Functional deficits: Difficulty with recall of details  MOTOR SPEECH: Overall  motor speech: impaired Level of impairment: Conversation Respiration: Reduced support Phonation: normal Resonance: WFL Articulation: Impaired: conversation Intelligibility: Intelligibility reduced Motor planning: Appears intact Motor speech errors: NA Interfering components: NA Effective technique: slow rate, increased vocal intensity, and over articulate  ORAL MOTOR EXAMINATION: Overall status: WFL Comments:    CLINICAL SWALLOW ASSESSMENT:   Current diet: regular and thin liquids Dentition: adequate natural dentition Patient directly observed with POs: Yes: thin liquids  Feeding: able to feed self Liquids provided by: cup and straw Oral phase signs and symptoms: NA Pharyngeal phase signs and symptoms: multiple swallows Comments: [facility] Swallow Protocol PASS   *Pt reports some difficulty with drier foods and occasionally getting strangled with liquids. No observed this date. To monitor.   SOCIAL HISTORY: Occupation: Warden/ranger intake: suboptimal Caffeine/alcohol intake: minimal Daily voice use: minimal  PERCEPTUAL VOICE ASSESSMENT: Voice quality: normal Vocal abuse: NA Resonance: normal Respiratory function: thoracic breathing  LSVT-LOUD VOICE EVALUATION: Maximum phonation time for sustained "ah": 13 sec Mean intensity during sustained "ah": 80 dB  Mean fundamental frequency during sustained "ah": 111 Hz  Mean intensity sustained during conversational speech: 70 dB Habitual pitch: 110 Hz Highest dynamic pitch when altering pitch from a low note to a high note: 175 Hz Lowest dynamic pitch when altering from a high note to a low note: 106 Hz Highest dynamic pitch during conversational  speech: 159 Hz Lowest dynamic pitch during conversational speech: 82 Hz Average time patient was able to sustain /s/: 4.8 seconds Average time patient was able to sustain /z/: 13.8 seconds s/z ratio : .34 (indicating suspected respiratory inefficiency)   Speech is characterized by Other:  . For trained listener in quiet environment with context, intelligibility was >90%.    Patient able to improve all parameters with model ("Loud like me") Stimulability: Improved vocal quality with loud voice (81 dB) for sustained vowel, functional phrases averaged 78 dB and improved pitch range with model.     The Communication Effectiveness Survey is a patient-reported outcome measure in which the patient rates their own effectiveness in different communication situations. A higher score indicates greater effectiveness. Pt's self-rating was 27/32. Patient reported mild difficulty conversing with a telephone, conversing in a noisy environment, speaking when emotional, conversing when traveling in a car, having conversation with someone at a distance.   STANDARDIZED ASSESSMENTS: SLUMS: 24/30  PATIENT REPORTED OUTCOME MEASURES (PROM): Cognitive function: Short Form: 34 and Communication Effectiveness Survey: 27                                                                                                                            TREATMENT DATE:   08/16/23: Pt was seen for skilled ST services targeting dysarthria. Pt was able to recall how his voice should be sounding when doing his exercises. Pt completed exercises this date - pt reported he benefited from therapist hand moving as he glided up and down the scale on /a/. Pt required modA verbal cues to complete exercises - specifically the ones requiring cognition. To continue next session.   08/11/23: Pt was seen for skilled ST services targeting dysarthria. Pt completed 5 sets of 5 on EMST, as he had not completed his exercises yet today. He required minA verbal cues to complete correctly. SLP demonstrated how to use speech-to-text and notes function on his iPad. Pt reported he would like to write a routine to follow in the morning. We completed morning routines for re: mon/wed/fri and tu/th. SLP also demonstrated how notes can be used to write a  daily journal as pt reports he has difficulty recalling things from day to day. To cont next session/  08/09/23: Pt was seen for skilled ST services targeting dysarthria. Pt completed 5 sets of 5 on EMST today. He required min verbal cueing to complete task. Pt has been keeping up with EMST and SPEAK OUT exercises using his template for keeping track of reps/dates. SLP initiated memory strategy education this session. Pt reports he could benefit from several external memory strategies. To bring in phone next session for SLP demonstration of speech-to-text.   08/05/23: Pt was seen for skilled ST services targeting dysarthria. Pt completed 5 sets of 5 on EMST today. He required min-mod verbal cueing to complete task. Pt required SLP to keep count of breaths and encouragement to pause in between. Pt completed voice exercises  with therapist today.   Warm up: 82 dB avg  Reading: 81 dB avg  Cognitive Exercises 79 dB avg   08/02/23: Pt was seen for skilled ST services targeting dysarthria. Pt reports he has completed his EMST today. SLP led pt through warm up exercises re: diaphragmatic breathing; may, me, my, mo, moo; sustained phonation, and pitch glides. SLP instructed pt to project voice (think loud; speak over the fence) during warm up. Pt read instructions for SPEAK OUT and performed exercises with minA. He required occasional verbal cues to continue with intent for entirety of reading exercise vs trailing off. Pt participated in structured conversation with consistent cues for increasing loudness.   07/28/23: Pt was seen for skilled ST services targeting education for wife and design of templates for EMST, SPEAK OUT, and Medication log due to pt having difficulty remembering to complete exercises. SLP encouraged wife to help patient set up a memory and exercise station with calendar, medication log, and exercise log. Pt completed reading and cognitive portion of SPEAK OUT while SLP designed templates.  Provided in clear sheet protector at end of session. Pt to bring EMST next session.   07/21/23: Pt was seen for skilled ST services targeting review of SPEAK OUT. Pt reports he did not complete any voice exercises due to company this week. SLP explained the importance of consistency with exercises for demonstrated improvement. He has not completed his EMST exercises in over a year. SLP reassessed on his personal device. Pt encouraged to set a routine to complete voice and respiratory exercises.    EMST 150 3X5 reps @ 75% of MEP - 30 cmH20 with 15 second rest  (45 cmH20 MEP)   07/12/23: Pt was seen for skilled ST services targeting review of SPEAK OUT. SLP educated on Living with Intent Means To... found in his HEP book. Patient reported that speaking with conscious effort resonated the most with him and his idea of intent. SLP led patient through vocal warm up re: May-Me-My-Moe-Mo x5, sustained phonation /ah/ x5, pitch glides x5/each. Pt completed cognitive component of SPEAK OUT book independently (up and ___). Pt demonstrated completion of SPEAK OUT  exercises with spv. SLP set pt up with a system to separate his completion from previous therapy course (using pencil and X marks vs. Pen and Check Mark). Pt completed Lesson 1 with therapist for AM portion - to complete PM portion at home this evening.   07/08/23: Pt was seen for skilled ST services targeting continued voice assessment - see above. Pt also completed PROMs for cognitive function and dysarthria. Pt brought in his Speak Out book from previous therapist. SLP encouraged pt to continue with exercises. SLP is not speak out certified, but plans to use similar cues with exemplars from book for continuity of care.  PATIENT EDUCATION: Education details: PD and SLP role  Person educated: Patient and Spouse Education method: Explanation Education comprehension: verbalized understanding   GOALS: Goals reviewed with patient? Yes  SHORT  TERM GOALS: Target date: 08/07/23  Pt will complete baseline voice measures Baseline: Goal status:MET  2.  Pt will complete PROMs Baseline:  Goal status: MET  3.  Pt will demonstrate voice HEP independently Baseline:  Goal status: INITIAL  4.  Pt/wife will verbalize 3 memory strategies to improve recall of important information Baseline:  Goal status: INITIAL  5.   Baseline:  Goal status: INITIAL  6.   Baseline:  Goal status: INITIAL  LONG TERM GOALS: Target date: 09/06/23  Pt  will improve score on PROMs Baseline:  Goal status: INITIAL  2.  Pt will demonstrate improved vocal intensity with sustained phonation Baseline:  Goal status: INITIAL  3.  Pt will demonstrate improved vocal intensity in 5 min, unstructured conversation Baseline:  Goal status: INITIAL  4.  Pt/wife will report success using memory strategies at home Baseline:  Goal status: INITIAL  5.   Baseline:  Goal status: INITIAL  6.   Baseline:  Goal status: INITIAL  ASSESSMENT:  CLINICAL IMPRESSION: Pt is a 78 yo male who presents to ST OP for evaluation for sx 2/2 Parkinson's Disease.  Pt endorses changes in vocal intensity, breath support, and cognition re: focus, memory. Pt has received speech therapy before at [facility] ~2 years ago. Wife reports he has been referred for a neuropsych evaluation for cognition; therefore, SLP to just complete a screen at this time. Pt was assessed using SLUMS - scoring a 24/30. Impairments noted in recall and word generation. Pt with complaints of occasional difficulty with thin liquids and solids. Pt passed YSP, with no overt s/sx of aspiration. Pt reports he has some difficulty with drier solids getting stuck. SLP suggested moister meats re: chicken thighs vs chicken breast, cutting items into smaller pieces, and adding sauces/gravies to drier foods. Unable to complete voice measures due to time constraints. To complete next session. SLP rec skilled ST  services to address cognitive-communication impairment, dysarthria, and monitor dysphagia.    OBJECTIVE IMPAIRMENTS: include attention, memory, executive functioning, and dysarthria. These impairments are limiting patient from effectively communicating at home and in community. Factors affecting potential to achieve goals and functional outcome are NA. Patient will benefit from skilled SLP services to address above impairments and improve overall function.  REHAB POTENTIAL: Good  PLAN:  SLP FREQUENCY: 1-2x/week  SLP DURATION: 8 weeks  PLANNED INTERVENTIONS: Diet toleration management , Cueing hierachy, Internal/external aids, Functional tasks, Multimodal communication approach, SLP instruction and feedback, Compensatory strategies, Patient/family education, and 60109 Treatment of speech (30 or 45 min)     [employer], CCC-SLP 08/16/2023, 12:34 PM

## 2023-08-17 NOTE — Therapy (Signed)
 OUTPATIENT PHYSICAL THERAPY NEURO TREATMENT   Patient Name: Patrick Ross MRN: [id number] DOB:1945-12-18, 78 y.o., male Today's Date: 08/18/2023   PCP: Millicent Sharper MD REFERRING PROVIDER: Evonnie Asberry RAMAN DO  END OF SESSION:  PT End of Session - 08/18/23 1315     Visit Number 9    Authorization Type Medicare    PT Start Time 1315    PT Stop Time 1400    PT Time Calculation (min) 45 min                  Past Medical History:  Diagnosis Date   Depression    DVT (deep venous thrombosis) (HCC)    GERD (gastroesophageal reflux disease)    History of kidney stones    passed   Hypertension    Hypothyroidism    Lumbar stenosis    OSA (obstructive sleep apnea)    wears cpap   Parkinson's disease (HCC)    PONV (postoperative nausea and vomiting)    REM behavioral disorder    Urinary incontinence    Past Surgical History:  Procedure Laterality Date   BACK SURGERY     x 2 - not fusion   BIOPSY  06/01/2022   Procedure: BIOPSY;  Surgeon: Dianna Specking, MD;  Location: [facility];  Service: Gastroenterology;;   COLONOSCOPY     DEEP BRAIN STIMULATOR PLACEMENT     left 12/27/2012, right 12/10/2011, left revision 02/02/2013, replacement bilateral 11/21/2015   ESOPHAGOGASTRODUODENOSCOPY (EGD) WITH PROPOFOL  N/A 06/01/2022   Procedure: ESOPHAGOGASTRODUODENOSCOPY (EGD) WITH PROPOFOL ;  Surgeon: Dianna Specking, MD;  Location: [facility];  Service: Gastroenterology;  Laterality: N/A;   MINOR PLACEMENT OF FIDUCIAL N/A 01/09/2019   Procedure: Fiducial placement;  Surgeon: Unice Pac, MD;  Location: [facility] OR;  Service: Neurosurgery;  Laterality: N/A;  Fiducial placement   PARTIAL NEPHRECTOMY Right 2005 ish   PULSE GENERATOR IMPLANT Right 01/23/2019   Procedure: Right chest implantable pulse generator change;  Surgeon: Unice Pac, MD;  Location: [facility] OR;  Service: Neurosurgery;  Laterality: Right;   SPINAL FUSION     lumbar Fusion x 2   SUBTHALAMIC STIMULATOR BATTERY  REPLACEMENT Left 02/02/2018   Procedure: Change implantable pulse generator battery, Left chest;  Surgeon: Unice Pac, MD;  Location: [facility] OR;  Service: Neurosurgery;  Laterality: Left;   SUBTHALAMIC STIMULATOR BATTERY REPLACEMENT N/A 01/22/2020   Procedure: REMOVAL OF LEFT BRAIN DEEP BRAIN STIMULATOR ELECTRODE, EXTENSION, AND PULSE GENERATOR;  Surgeon: Unice Pac, MD;  Location: [facility] OR;  Service: Neurosurgery;  Laterality: N/A;   SUBTHALAMIC STIMULATOR BATTERY REPLACEMENT Right 02/23/2022   Procedure: CHANGE IPG BATTERY, RT CHEST;  Surgeon: Dawley, Lani JAYSON, DO;  Location: [facility] OR;  Service: Neurosurgery;  Laterality: Right;   SUBTHALAMIC STIMULATOR INSERTION Left 01/16/2019   Procedure: Left deep brain stimulator electrode revision.;  Surgeon: Unice Pac, MD;  Location: [facility] OR;  Service: Neurosurgery;  Laterality: Left;   SUBTHALAMIC STIMULATOR INSERTION Left 01/23/2019   Procedure: Repositioning of Left Deep Brain Stimulator Electrode;  Surgeon: Unice Pac, MD;  Location: [facility] OR;  Service: Neurosurgery;  Laterality: Left;   TONSILLECTOMY     Patient Active Problem List   Diagnosis Date Noted   Melena 06/01/2022   Acute cystitis without hematuria 05/31/2022   Upper GI bleed 05/31/2022   Generalized weakness 05/30/2022   Abdominal pain 05/30/2022   OSA (obstructive sleep apnea) 05/30/2022   Hypothyroidism 05/30/2022   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) 12/14/2021   Mixed hyperlipidemia 12/14/2021   Essential  hypertension 12/14/2021   Palpitations 12/14/2021   Angina pectoris (HCC) 12/14/2021   Urge incontinence 01/15/2021   History of UTI 01/15/2021   Abnormal urine findings 01/15/2021   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   History of nephrolithiasis 01/15/2021   DVT, lower extremity, proximal, acute, right (HCC) 02/26/2020   Open scalp wound 01/22/2020   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HCC)    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   Parkinson's disease (HCC)  01/16/2018   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) 01/16/2018   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    ONSET DATE: exacerbation 06/21/23, Parkinson's diagnosis 2010  REFERRING DIAG: parkinsons disease with fluctuating dyskinesia  THERAPY DIAG:  Other lack of coordination  Other abnormalities of gait and mobility  Parkinson's disease with dyskinesia, with fluctuations (HCC)  Difficulty in walking, not elsewhere classified  Rationale for Evaluation and Treatment: Rehabilitation  SUBJECTIVE:                                                                                                                                                                                             SUBJECTIVE STATEMENT: I am alright. The parkinson's may be slightly better. Rode 6 miles this morning    EVAL-Pt and his wife report long history of parkinson's,  biggest difficulty with his mobility that they note is R lower back  pain and labored supine to sit movement, also poor foot clearance.  He doesn't like relying on the walker Pt accompanied by: significant other  PERTINENT HISTORY: parkinson's diagnosed greater than 10 yrs ago, deep brain stimulator R, had in L as well but had to be removed for various reasons.  walks around neighborhood 3 x week with aide Tuesdays and Thursdays parkisons bicycle class 2 x Drue training for upper body work out Referred to PT and ST by neurologist   PAIN:  Are you having pain? R lower back pain constant  PRECAUTIONS: Fall and Other: deep brain stimulator on R   RED FLAGS: None   WEIGHT BEARING RESTRICTIONS: No  FALLS: Has patient fallen in last 6 months? No  LIVING ENVIRONMENT: Lives with: lives with their spouse Lives in: House/apartment Stairs: has internal steps, but has had home renovated and adapted so the he and his wife live in downstairs apartment, all one level with bathroom and kitchen Has following equipment at home: Single point cane, Environmental consultant - 4  wheeled, Tour manager, Grab bars, and Ramped entry  PLOF: Independent  PATIENT GOALS: improve bed mobility and improve gait, stability, avoid falls  OBJECTIVE:  Note: Objective measures were completed at Evaluation unless otherwise noted.  DIAGNOSTIC FINDINGS: see neuro notes, has deep brain  stimulator R   COGNITION: Overall cognitive status: Within functional limits for tasks assessed   SENSATION: WFL  COORDINATION: Slowed movements B and noted curling R hand and R foot at times, with gait festinating on turns   EDEMA:  None noted  MUSCLE TONE: noted curling R fingers, toes, knee flex and hip flex at times, randomly during evaluation  MUSCLE LENGTH: Hamstrings: Right -30 deg; Left -30 deg Thomas test: Right nt deg; Left nt deg    POSTURE: Standing with some forward lean, weight on forefeet, L calf atrophied pt and wife report chronic after L lumbar surgery.     LOWER EXTREMITY ROM:   grossly wfl, hips not assessed as not positioned on mat today   LOWER EXTREMITY MMT:    MMT Right Eval Left Eval  Hip flexion 4 4-  Hip extension    Hip abduction    Hip adduction    Hip internal rotation    Hip external rotation    Knee flexion 4 4  Knee extension 4 4-  Ankle dorsiflexion 4 3+  Ankle plantarflexion 4- 3-  Ankle inversion    Ankle eversion    (Blank rows = not tested)  BED MOBILITY:  Not tested  TRANSFERS: I but slowed, uses hands on seat or with rollator  GAIT: Findings: Gait Characteristics: step to pattern, decreased step length- Right, decreased step length- Left, decreased stride length, decreased hip/knee flexion- Right, decreased hip/knee flexion- Left, shuffling, festinating, trunk flexed, and narrow BOS, Distance walked: 100', Assistive device utilized:Walker - 4 wheeled, Level of assistance: SBA, and Comments: festinating noted on turns, tends to turn L  FUNCTIONAL TESTS:  30 seconds chair stand test 6 reps Timed up and go (TUG): 33.39 with  rollator Standing static feet together 15 sec Standing semi tandem 10 sec each leg, needed CGA with L foot forward Standing marching, required B hand hold support to maintain balance                                                                                                                              TREATMENT DATE: 08/18/23 Leg ext 10# 2x10 HS curls 25# 2x10 OHP press yellow ball 2x10 Supine LE stretching- HS, LTR, SKTC  NuStep L5 x17mins    08/16/23 Holding big red ball tapping floor to overhead to side to side x5 Standing shoulder rows and ext green 2x10 Standing shoulder flexion with 2# WaTE 2x10 Box taps 4 holding on and then without walker CGA-minA  NuStep L5x14mins STS 2x10   08/11/23 NuStep L5x33mins  Fitter pushes 2 blue bands 2x10  Side steps over obstacles in bars  On airex reaching for numbers  Isometric ab push downs 2x10   08/09/23 NuStep L5x72mins  STS with chest press red ball 2x8 Standing march 3#  3# hip abd 2x10  Horizontal abd green 2x10 Leg ext 10# 2x10 HS curls 25# 2x10    08/04/23 30s chair test TUG 27s  MMT 4/5 Standing shoulder ext and rows green band 2x10 Standing shoulder flexion 2# WaTE x10 - stopped due to feeling dizzy and off balance  NuStep L5 x17mins  Seated rotation with yellow ball 2x10   08/02/23 NuStep L5x53mins  Side steps over obstacles in bars Forwards steps over obstacles in bars Calf raises x10 Box taps 4 1HRA Step ups 4 2HRA  STS with OHP 2x5  07/28/23 Supine HS stretch and SKTC  Lower trunk rotations x10 SLR 2x10 Feet on pball rotations and knees to chest AB isometric holds x10  NuStep L5x37mins  STS 3x5   07/27/23 NuStep L5x28mins  LAQ 2# 2x10 HS curls green 2x10  Hip abduction green 2x10 STS 2x10  Stretching with pball rotations and flexion x5     07/06/23 Evaluation, advised to attempt prone lying on bed 10 to 15 min  to stretch body into extension( pt doesn't ever lie prone)   PATIENT  EDUCATION: Education details: POC, goals Person educated: Patient and Spouse Education method: Explanation, Demonstration, Tactile cues, and Verbal cues Education comprehension: verbalized understanding, verbal cues required, and needs further education  HOME EXERCISE PROGRAM: Access Code: [email or web address] URL: [email or web address] Date: 08/02/2023 Prepared by: Almetta Fam  Exercises - Supine Hamstring Stretch with Strap  - 2 x daily - 7 x weekly - 2 reps - 15 hold - Modified Thomas Stretch  - 2 x daily - 7 x weekly - 2 reps - 15 hold - Supine Bridge  - 1 x daily - 7 x weekly - 2 sets - 10 reps - Standing Shoulder Horizontal Abduction with Resistance  - 1 x daily - 7 x weekly - 2 sets - 10 reps  GOALS: Goals reviewed with patient? Yes  SHORT TERM GOALS: Target date: 4 weeks, 08/03/23  I home program for posture and for hamstring, hip flexor stretchig Baseline: Goal status: ongoing 08/02/23  LONG TERM GOALS: Target date: 09/28/23: 12 weeks   30 sec sit to stand improve from 6 reps to 12 reps for improved coordination, strength, endurance LEs Baseline:  Goal status: 7 reps ongoing 08/04/23  2.  I and pain free supine to sit motion , rolling to R Baseline: very painful per pt and wife history, not specifically assessed in clinic today Goal status: ongoing 07/28/23, ongoing 08/09/23  3.  TUG score improve from 33 sec to 18 or less for reduced fall risk   Baseline:  Goal status: 27s ongoing 08/04/23  4.  Improve strength LE's for B quads, hamstrings to 4+5 for improved functional strength Baseline: 4- Goal status: 4/5 08/04/23   ASSESSMENT:  CLINICAL IMPRESSION: Continued to work on big movements and strengthening. Worked on some stretching today as he is tight and rigid with movements. With lower trunk rotations, he is tighter on his R side. Still has pain with bed mobility in his trunk and low back but states it settles after a while. Will continue to progress to improve his  overall function.    EVAL Patient is a 78 y.o. male who was evaluated today by physical therapy due to referral from his neurologist, with decline in function, particularly control of R extremities, with his Parkinson's diagnosis.  He and his wife have been proactive about maintaining his function, he attends a training/ strengthening session locally 2 x week , attends parkinson's cycling weekly, and walks around 0.6 miles with his aide 3 x week outdoors with his rollator.  They recently have adapted their home with a main floor renovation so  that their bedroom, bath, kitchen are all on one level.   Main problems identified today are weakness B LE's, L ankle is weak chronically due to lumbar radiculopathy and B knee, hips musculature is weak.  He also has coordination deficits with festinating gait particularly on turns and static / dynamic balance deficits.  Has chronic R sided lower back pain which is particularly intense with moving supine to sit.  He should benefit from physical therapy intervention to address his deficits and update his current routine for stretching and strengthening at home.    OBJECTIVE IMPAIRMENTS: Abnormal gait, decreased activity tolerance, decreased balance, decreased coordination, decreased endurance, decreased mobility, difficulty walking, decreased strength, hypomobility, impaired perceived functional ability, impaired UE functional use, postural dysfunction, and pain.   ACTIVITY LIMITATIONS: carrying, lifting, squatting, stairs, transfers, and locomotion level  PARTICIPATION LIMITATIONS: meal prep, cleaning, laundry, shopping, community activity, and church  PERSONAL FACTORS: Age, Behavior pattern, Fitness, Past/current experiences, Time since onset of injury/illness/exacerbation, and 1-2 comorbidities: h/o L lumbar radiculopathy and weakness L LE, chronic R sided lower back pain, are also affecting patient's functional outcome.   REHAB POTENTIAL: Good  CLINICAL  DECISION MAKING: Evolving/moderate complexity  EVALUATION COMPLEXITY: Moderate  PLAN:  PT FREQUENCY: 2x/week  PT DURATION: 12 weeks  PLANNED INTERVENTIONS: 97110-Therapeutic exercises, 97530- Therapeutic activity, [id number]- Neuromuscular re-education, 97535- Self Care, and [id number]- Manual therapy  PLAN FOR NEXT SESSION: recheck goals for 10th visit PN   Almetta Fam, PT, DPT 08/18/2023, 1:55 PM

## 2023-08-18 ENCOUNTER — Encounter: Payer: Self-pay | Admitting: Speech Pathology

## 2023-08-18 ENCOUNTER — Ambulatory Visit

## 2023-08-18 ENCOUNTER — Ambulatory Visit: Admitting: Speech Pathology

## 2023-08-18 DIAGNOSIS — R2689 Other abnormalities of gait and mobility: Secondary | ICD-10-CM

## 2023-08-18 DIAGNOSIS — R262 Difficulty in walking, not elsewhere classified: Secondary | ICD-10-CM

## 2023-08-18 DIAGNOSIS — R41841 Cognitive communication deficit: Secondary | ICD-10-CM

## 2023-08-18 DIAGNOSIS — R278 Other lack of coordination: Secondary | ICD-10-CM

## 2023-08-18 DIAGNOSIS — G20B2 Parkinson's disease with dyskinesia, with fluctuations: Secondary | ICD-10-CM

## 2023-08-18 NOTE — Therapy (Signed)
 OUTPATIENT SPEECH LANGUAGE PATHOLOGY TREATMENT   Patient Name: Patrick Ross MRN: [id number] DOB:1945-07-18, 78 y.o., male Today's Date: 08/18/2023  PCP: Jayne Mews, MD REFERRING PROVIDER: Shirline Dover, DO  Speech Therapy Progress Note  Dates of Reporting Period: 07/06/23 to present  Subjective Statement: Pt reports improvement in voice. Has not been as consistent with other cognitive tasks.   Objective: See previous tx notes  Goal Update: Making progress towards goals.   Plan: Cont with skilled services  Reason Skilled Services are Required: to maximize functional communication   END OF SESSION:  End of Session - 08/18/23 1235     Visit Number 11    Number of Visits 17    Date for SLP Re-Evaluation 09/06/23    SLP Start Time 1232    SLP Stop Time  1312    SLP Time Calculation (min) 40 min    Activity Tolerance Patient tolerated treatment well          Past Medical History:  Diagnosis Date   Depression    DVT (deep venous thrombosis) (HCC)    GERD (gastroesophageal reflux disease)    History of kidney stones    passed   Hypertension    Hypothyroidism    Lumbar stenosis    OSA (obstructive sleep apnea)    wears cpap   Parkinson's disease (HCC)    PONV (postoperative nausea and vomiting)    REM behavioral disorder    Urinary incontinence    Past Surgical History:  Procedure Laterality Date   BACK SURGERY     x 2 - not fusion   BIOPSY  06/01/2022   Procedure: BIOPSY;  Surgeon: Baldo Bonds, MD;  Location: [facility];  Service: Gastroenterology;;   COLONOSCOPY     DEEP BRAIN STIMULATOR PLACEMENT     left 12/27/2012, right 12/10/2011, left revision 02/02/2013, replacement bilateral 11/21/2015   ESOPHAGOGASTRODUODENOSCOPY (EGD) WITH PROPOFOL  N/A 06/01/2022   Procedure: ESOPHAGOGASTRODUODENOSCOPY (EGD) WITH PROPOFOL ;  Surgeon: Baldo Bonds, MD;  Location: [facility];  Service: Gastroenterology;  Laterality: N/A;   MINOR PLACEMENT OF FIDUCIAL  N/A 01/09/2019   Procedure: Fiducial placement;  Surgeon: Manya Sells, MD;  Location: [facility] OR;  Service: Neurosurgery;  Laterality: N/A;  Fiducial placement   PARTIAL NEPHRECTOMY Right 2005 ish   PULSE GENERATOR IMPLANT Right 01/23/2019   Procedure: Right chest implantable pulse generator change;  Surgeon: Manya Sells, MD;  Location: [facility] OR;  Service: Neurosurgery;  Laterality: Right;   SPINAL FUSION     lumbar Fusion x 2   SUBTHALAMIC STIMULATOR BATTERY REPLACEMENT Left 02/02/2018   Procedure: Change implantable pulse generator battery, Left chest;  Surgeon: Manya Sells, MD;  Location: [facility] OR;  Service: Neurosurgery;  Laterality: Left;   SUBTHALAMIC STIMULATOR BATTERY REPLACEMENT N/A 01/22/2020   Procedure: REMOVAL OF LEFT BRAIN DEEP BRAIN STIMULATOR ELECTRODE, EXTENSION, AND PULSE GENERATOR;  Surgeon: Manya Sells, MD;  Location: [facility] OR;  Service: Neurosurgery;  Laterality: N/A;   SUBTHALAMIC STIMULATOR BATTERY REPLACEMENT Right 02/23/2022   Procedure: CHANGE IPG BATTERY, RT CHEST;  Surgeon: Dawley, Colby Daub, DO;  Location: [facility] OR;  Service: Neurosurgery;  Laterality: Right;   SUBTHALAMIC STIMULATOR INSERTION Left 01/16/2019   Procedure: Left deep brain stimulator electrode revision.;  Surgeon: Manya Sells, MD;  Location: [facility] OR;  Service: Neurosurgery;  Laterality: Left;   SUBTHALAMIC STIMULATOR INSERTION Left 01/23/2019   Procedure: Repositioning of Left Deep Brain Stimulator Electrode;  Surgeon: Manya Sells, MD;  Location: [facility] OR;  Service: Neurosurgery;  Laterality: Left;  TONSILLECTOMY     Patient Active Problem List   Diagnosis Date Noted   Melena 06/01/2022   Acute cystitis without hematuria 05/31/2022   Upper GI bleed 05/31/2022   Generalized weakness 05/30/2022   Abdominal pain 05/30/2022   OSA (obstructive sleep apnea) 05/30/2022   Hypothyroidism 05/30/2022   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) 12/14/2021   Mixed hyperlipidemia 12/14/2021   Essential  hypertension 12/14/2021   Palpitations 12/14/2021   Angina pectoris (HCC) 12/14/2021   Urge incontinence 01/15/2021   History of UTI 01/15/2021   Abnormal urine findings 01/15/2021   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   History of nephrolithiasis 01/15/2021   DVT, lower extremity, proximal, acute, right (HCC) 02/26/2020   Open scalp wound 01/22/2020   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HCC)    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   Parkinson's disease (HCC) 01/16/2018   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 Yes: NPRS scale: 2 Pain location: hip Pain description: chronic Aggravating factors: sitting up Relieving factors: N/A  FALLS: Has patient fallen in last 6 months?  Yes  LIVING ENVIRONMENT: Lives with: lives with their family; 1 Lives in: House/apartment  PLOF:  Level of assistance: Independent with ADLs, Independent with IADLs Employment: Retired  PATIENT GOALS: voice, cognition  OBJECTIVE:  Note: Objective measures were completed at Evaluation unless otherwise noted.  DIAGNOSTIC FINDINGS: No imaging on this EMR since 2021 CT Head.   COGNITION: Overall cognitive status: Impaired Areas of impairment:  Attention: Impaired: Selective, Alternating, Divided Memory: Impaired: Short term Prospective Executive function: Impaired: Organization and Planning Functional deficits: Difficulty with recall of details  MOTOR SPEECH: Overall motor speech:  impaired Level of impairment: Conversation Respiration: Reduced support Phonation: normal Resonance: WFL Articulation: Impaired: conversation Intelligibility: Intelligibility reduced Motor planning: Appears intact Motor speech errors: NA Interfering components: NA Effective technique: slow rate, increased vocal intensity, and over articulate  ORAL MOTOR EXAMINATION: Overall status: WFL Comments:    CLINICAL SWALLOW ASSESSMENT:   Current diet: regular and thin liquids Dentition: adequate natural dentition Patient directly observed with POs: Yes: thin liquids  Feeding: able to feed self Liquids provided by: cup and straw Oral phase signs and symptoms: NA Pharyngeal phase signs and symptoms: multiple swallows Comments: [facility] Swallow Protocol PASS   *Pt reports some difficulty with drier foods and occasionally getting strangled with liquids. No observed this date. To monitor.   SOCIAL HISTORY: Occupation: Warden/ranger intake: suboptimal Caffeine/alcohol intake: minimal Daily voice use: minimal  PERCEPTUAL VOICE ASSESSMENT: Voice quality: normal Vocal abuse: NA Resonance: normal Respiratory function: thoracic breathing  LSVT-LOUD VOICE EVALUATION: Maximum phonation time for sustained "ah": 13 sec Mean intensity during sustained "ah": 80 dB  Mean fundamental frequency during sustained "ah": 111 Hz  Mean intensity sustained during conversational speech: 70 dB Habitual pitch: 110 Hz Highest dynamic pitch when altering pitch from a low note to a high note: 175 Hz Lowest dynamic pitch when altering from a high note to a low note: 106 Hz Highest dynamic pitch during conversational speech: 159 Hz Lowest  dynamic pitch during conversational speech: 82 Hz Average time patient was able to sustain /s/: 4.8 seconds Average time patient was able to sustain /z/: 13.8 seconds s/z ratio : .34 (indicating suspected respiratory inefficiency)   Speech is characterized by Other: . For trained  listener in quiet environment with context, intelligibility was >90%.    Patient able to improve all parameters with model ("Loud like me") Stimulability: Improved vocal quality with loud voice (81 dB) for sustained vowel, functional phrases averaged 78 dB and improved pitch range with model.     The Communication Effectiveness Survey is a patient-reported outcome measure in which the patient rates their own effectiveness in different communication situations. A higher score indicates greater effectiveness. Pt's self-rating was 27/32. Patient reported mild difficulty conversing with a telephone, conversing in a noisy environment, speaking when emotional, conversing when traveling in a car, having conversation with someone at a distance.   STANDARDIZED ASSESSMENTS: SLUMS: 24/30  PATIENT REPORTED OUTCOME MEASURES (PROM): Cognitive function: Short Form: 34 and Communication Effectiveness Survey: 27                                                                                                                            TREATMENT DATE:   08/18/23: Pt was seen for skilled ST services targeting dysarthria and respiratory muscle strength training. SLP increased cmH20 by clockwise 1/4 turn. We completed 5 sets of 5 at the new level (37 cmH20). Pt rated effort level 7 out of 10 In the zone. Pt continues to use his paper to check off when he completes his daily exercises. SLP facilitated memory by encouraging pt to use speech to text to complete a journal entry. Pt was able to do with minA. Wife reportsimprovement in communication at home and that he has been more aware of when he begins to trail off in the conversation.    08/16/23: Pt was seen for skilled ST services targeting dysarthria. Pt was able to recall how his voice should be sounding when doing his exercises. Pt completed exercises this date - pt reported he benefited from therapist hand moving as he glided up and down the scale on /a/. Pt  required modA verbal cues to complete exercises - specifically the ones requiring cognition. To continue next session.   08/11/23: Pt was seen for skilled ST services targeting dysarthria. Pt completed 5 sets of 5 on EMST, as he had not completed his exercises yet today. He required minA verbal cues to complete correctly. SLP demonstrated how to use speech-to-text and notes function on his iPad. Pt reported he would like to write a routine to follow in the morning. We completed morning routines for re: mon/wed/fri and tu/th. SLP also demonstrated how notes can be used to write a daily journal as pt reports he has difficulty recalling things from day to day. To cont next session/  08/09/23: Pt was seen for skilled ST services targeting dysarthria. Pt completed 5 sets of 5  on EMST today. He required min verbal cueing to complete task. Pt has been keeping up with EMST and SPEAK OUT exercises using his template for keeping track of reps/dates. SLP initiated memory strategy education this session. Pt reports he could benefit from several external memory strategies. To bring in phone next session for SLP demonstration of speech-to-text.   08/05/23: Pt was seen for skilled ST services targeting dysarthria. Pt completed 5 sets of 5 on EMST today. He required min-mod verbal cueing to complete task. Pt required SLP to keep count of breaths and encouragement to pause in between. Pt completed voice exercises with therapist today.   Warm up: 82 dB avg  Reading: 81 dB avg  Cognitive Exercises 79 dB avg   08/02/23: Pt was seen for skilled ST services targeting dysarthria. Pt reports he has completed his EMST today. SLP led pt through warm up exercises re: diaphragmatic breathing; may, me, my, mo, moo; sustained phonation, and pitch glides. SLP instructed pt to project voice (think loud; speak over the fence) during warm up. Pt read instructions for SPEAK OUT and performed exercises with minA. He required occasional  verbal cues to continue with intent for entirety of reading exercise vs trailing off. Pt participated in structured conversation with consistent cues for increasing loudness.   07/28/23: Pt was seen for skilled ST services targeting education for wife and design of templates for EMST, SPEAK OUT, and Medication log due to pt having difficulty remembering to complete exercises. SLP encouraged wife to help patient set up a memory and exercise station with calendar, medication log, and exercise log. Pt completed reading and cognitive portion of SPEAK OUT while SLP designed templates. Provided in clear sheet protector at end of session. Pt to bring EMST next session.   07/21/23: Pt was seen for skilled ST services targeting review of SPEAK OUT. Pt reports he did not complete any voice exercises due to company this week. SLP explained the importance of consistency with exercises for demonstrated improvement. He has not completed his EMST exercises in over a year. SLP reassessed on his personal device. Pt encouraged to set a routine to complete voice and respiratory exercises.    EMST 150 3X5 reps @ 75% of MEP - 30 cmH20 with 15 second rest  (45 cmH20 MEP)   07/12/23: Pt was seen for skilled ST services targeting review of SPEAK OUT. SLP educated on Living with Intent Means To... found in his HEP book. Patient reported that speaking with conscious effort resonated the most with him and his idea of intent. SLP led patient through vocal warm up re: May-Me-My-Moe-Mo x5, sustained phonation /ah/ x5, pitch glides x5/each. Pt completed cognitive component of SPEAK OUT book independently (up and ___). Pt demonstrated completion of SPEAK OUT  exercises with spv. SLP set pt up with a system to separate his completion from previous therapy course (using pencil and X marks vs. Pen and Check Mark). Pt completed Lesson 1 with therapist for AM portion - to complete PM portion at home this evening.   07/08/23: Pt was  seen for skilled ST services targeting continued voice assessment - see above. Pt also completed PROMs for cognitive function and dysarthria. Pt brought in his Speak Out book from previous therapist. SLP encouraged pt to continue with exercises. SLP is not speak out certified, but plans to use similar cues with exemplars from book for continuity of care.  PATIENT EDUCATION: Education details: PD and SLP role  Person educated: Patient and Spouse  Education method: Explanation Education comprehension: verbalized understanding   GOALS: Goals reviewed with patient? Yes  SHORT TERM GOALS: Target date: 08/07/23  Pt will complete baseline voice measures Baseline: Goal status:MET  2.  Pt will complete PROMs Baseline:  Goal status: MET  3.  Pt will demonstrate voice HEP independently Baseline:  Goal status: INITIAL  4.  Pt/wife will verbalize 3 memory strategies to improve recall of important information Baseline:  Goal status: INITIAL  5.   Baseline:  Goal status: INITIAL  6.   Baseline:  Goal status: INITIAL  LONG TERM GOALS: Target date: 09/06/23  Pt will improve score on PROMs Baseline:  Goal status: INITIAL  2.  Pt will demonstrate improved vocal intensity with sustained phonation Baseline:  Goal status: INITIAL  3.  Pt will demonstrate improved vocal intensity in 5 min, unstructured conversation Baseline:  Goal status: INITIAL  4.  Pt/wife will report success using memory strategies at home Baseline:  Goal status: INITIAL  5.   Baseline:  Goal status: INITIAL  6.   Baseline:  Goal status: INITIAL  ASSESSMENT:  CLINICAL IMPRESSION: Pt is a 78 yo male who presents to ST OP for evaluation for sx 2/2 Parkinson's Disease.  Pt endorses changes in vocal intensity, breath support, and cognition re: focus, memory. Pt has received speech therapy before at [facility] ~2 years ago. Wife reports he has been referred for a neuropsych evaluation for cognition;  therefore, SLP to just complete a screen at this time. Pt was assessed using SLUMS - scoring a 24/30. Impairments noted in recall and word generation. Pt with complaints of occasional difficulty with thin liquids and solids. Pt passed YSP, with no overt s/sx of aspiration. Pt reports he has some difficulty with drier solids getting stuck. SLP suggested moister meats re: chicken thighs vs chicken breast, cutting items into smaller pieces, and adding sauces/gravies to drier foods. Unable to complete voice measures due to time constraints. To complete next session. SLP rec skilled ST services to address cognitive-communication impairment, dysarthria, and monitor dysphagia.    OBJECTIVE IMPAIRMENTS: include attention, memory, executive functioning, and dysarthria. These impairments are limiting patient from effectively communicating at home and in community. Factors affecting potential to achieve goals and functional outcome are NA. Patient will benefit from skilled SLP services to address above impairments and improve overall function.  REHAB POTENTIAL: Good  PLAN:  SLP FREQUENCY: 1-2x/week  SLP DURATION: 8 weeks  PLANNED INTERVENTIONS: Diet toleration management , Cueing hierachy, Internal/external aids, Functional tasks, Multimodal communication approach, SLP instruction and feedback, Compensatory strategies, Patient/family education, and 52841 Treatment of speech (30 or 45 min)     [employer], CCC-SLP 08/18/2023, 12:41 PM

## 2023-08-22 ENCOUNTER — Ambulatory Visit: Admitting: Occupational Therapy

## 2023-08-22 DIAGNOSIS — R41840 Attention and concentration deficit: Secondary | ICD-10-CM

## 2023-08-22 DIAGNOSIS — R41844 Frontal lobe and executive function deficit: Secondary | ICD-10-CM

## 2023-08-22 DIAGNOSIS — R2689 Other abnormalities of gait and mobility: Secondary | ICD-10-CM

## 2023-08-22 DIAGNOSIS — R29818 Other symptoms and signs involving the nervous system: Secondary | ICD-10-CM

## 2023-08-22 DIAGNOSIS — R278 Other lack of coordination: Secondary | ICD-10-CM

## 2023-08-22 DIAGNOSIS — R262 Difficulty in walking, not elsewhere classified: Secondary | ICD-10-CM | POA: Diagnosis not present

## 2023-08-22 NOTE — Therapy (Signed)
 OUTPATIENT OCCUPATIONAL THERAPY PARKINSON'S treatment  Patient Name: Patrick Ross MRN: [id number] DOB:Jan 16, 1946, 78 y.o., male Today's Date: 08/22/2023  PCP: Dr. Millicent REFERRING PROVIDER:Dr. Tat  END OF SESSION:  OT End of Session - 08/22/23 1511     Visit Number 2    Number of Visits 25    Date for OT Re-Evaluation 11/08/23    Authorization Type MCR, [employer]    Authorization Time Period 12 weeks, anticipate d/c after 8 weeks    Authorization - Visit Number 2    Progress Note Due on Visit 10    OT Start Time 1447    OT Stop Time 1527    OT Time Calculation (min) 40 min    Activity Tolerance Patient tolerated treatment well    Behavior During Therapy WFL for tasks assessed/performed           Past Medical History:  Diagnosis Date   Depression    DVT (deep venous thrombosis) (HCC)    GERD (gastroesophageal reflux disease)    History of kidney stones    passed   Hypertension    Hypothyroidism    Lumbar stenosis    OSA (obstructive sleep apnea)    wears cpap   Parkinson's disease (HCC)    PONV (postoperative nausea and vomiting)    REM behavioral disorder    Urinary incontinence    Past Surgical History:  Procedure Laterality Date   BACK SURGERY     x 2 - not fusion   BIOPSY  06/01/2022   Procedure: BIOPSY;  Surgeon: Dianna Specking, MD;  Location: [facility];  Service: Gastroenterology;;   COLONOSCOPY     DEEP BRAIN STIMULATOR PLACEMENT     left 12/27/2012, right 12/10/2011, left revision 02/02/2013, replacement bilateral 11/21/2015   ESOPHAGOGASTRODUODENOSCOPY (EGD) WITH PROPOFOL  N/A 06/01/2022   Procedure: ESOPHAGOGASTRODUODENOSCOPY (EGD) WITH PROPOFOL ;  Surgeon: Dianna Specking, MD;  Location: [facility];  Service: Gastroenterology;  Laterality: N/A;   MINOR PLACEMENT OF FIDUCIAL N/A 01/09/2019   Procedure: Fiducial placement;  Surgeon: Unice Pac, MD;  Location: [facility] OR;  Service: Neurosurgery;  Laterality: N/A;  Fiducial placement   PARTIAL  NEPHRECTOMY Right 2005 ish   PULSE GENERATOR IMPLANT Right 01/23/2019   Procedure: Right chest implantable pulse generator change;  Surgeon: Unice Pac, MD;  Location: [facility] OR;  Service: Neurosurgery;  Laterality: Right;   SPINAL FUSION     lumbar Fusion x 2   SUBTHALAMIC STIMULATOR BATTERY REPLACEMENT Left 02/02/2018   Procedure: Change implantable pulse generator battery, Left chest;  Surgeon: Unice Pac, MD;  Location: [facility] OR;  Service: Neurosurgery;  Laterality: Left;   SUBTHALAMIC STIMULATOR BATTERY REPLACEMENT N/A 01/22/2020   Procedure: REMOVAL OF LEFT BRAIN DEEP BRAIN STIMULATOR ELECTRODE, EXTENSION, AND PULSE GENERATOR;  Surgeon: Unice Pac, MD;  Location: [facility] OR;  Service: Neurosurgery;  Laterality: N/A;   SUBTHALAMIC STIMULATOR BATTERY REPLACEMENT Right 02/23/2022   Procedure: CHANGE IPG BATTERY, RT CHEST;  Surgeon: Dawley, Lani JAYSON, DO;  Location: [facility] OR;  Service: Neurosurgery;  Laterality: Right;   SUBTHALAMIC STIMULATOR INSERTION Left 01/16/2019   Procedure: Left deep brain stimulator electrode revision.;  Surgeon: Unice Pac, MD;  Location: [facility] OR;  Service: Neurosurgery;  Laterality: Left;   SUBTHALAMIC STIMULATOR INSERTION Left 01/23/2019   Procedure: Repositioning of Left Deep Brain Stimulator Electrode;  Surgeon: Unice Pac, MD;  Location: [facility] OR;  Service: Neurosurgery;  Laterality: Left;   TONSILLECTOMY     Patient Active Problem List   Diagnosis Date Noted   Melena 06/01/2022  Acute cystitis without hematuria 05/31/2022   Upper GI bleed 05/31/2022   Generalized weakness 05/30/2022   Abdominal pain 05/30/2022   OSA (obstructive sleep apnea) 05/30/2022   Hypothyroidism 05/30/2022   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) 12/14/2021   Mixed hyperlipidemia 12/14/2021   Essential hypertension 12/14/2021   Palpitations 12/14/2021   Angina pectoris (HCC) 12/14/2021   Urge incontinence 01/15/2021   History of UTI 01/15/2021   Abnormal urine  findings 01/15/2021   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   History of nephrolithiasis 01/15/2021   DVT, lower extremity, proximal, acute, right (HCC) 02/26/2020   Open scalp wound 01/22/2020   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HCC)    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   Parkinson's disease (HCC) 01/16/2018   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  Rationale for Evaluation and Treatment: Rehabilitation  SUBJECTIVE:   SUBJECTIVE STATEMENT: Pt reports wrist pain with pressure. Pt accompanied by: self  PERTINENT HISTORY: parkinson's diagnosed greater than 10 yrs ago, deep brain stimulator R, had in L as well but had to be removed for various reasons. Depression, sleep apnea, hx of back surgery, see hx above walks around neighborhood 3 x week with aide Tuesdays and Thursdays parkisons bicycle class 2 x Drue training for upper body work out   PRECAUTIONS: Fall- PT is addressing  WEIGHT BEARING RESTRICTIONS: No  PAIN:  Are you having pain? Yes: NPRS scale: 2-3/10 Pain location: right wrist  Pain description: aching Aggravating factors: pressure, pt noticed after arm bike Relieving factors: rest, not applying pressure through hand and wrist   FALLS: Has patient fallen in last 6 months? No  LIVING ENVIRONMENT: Lives with: lives with their spouse, has CNA 3x weeks Lives in: House/apartment Stairs: Yes: Internal: has stairt lift, and coverted garage appt on ground level Has following equipment at home: U-step  PLOF: Needs assistance with ADLs and Needs assistance with homemaking  PATIENT GOALS: Pt wants to be able to tie shoes, and more I with  dressing  OBJECTIVE:  Note: Objective measures were completed at Evaluation unless otherwise noted.  HAND DOMINANCE: Right  ADLs: Overall ADLs: increased time required  Transfers/ambulation related to ADLs: Eating: difficulty cutting food, needs assist  Grooming: mod I with brushing teeth, combing, CNA assists with shaving UB Dressing: mod I LB Dressing: mod-max A for LB dressing Toileting: mod I with U-step Bathing: supervision-walk in shower, level has built in seat and grab bars Tub Shower transfers: min A Equipment: Grab bars and level walk in shower with built in bench  IADLs:Dependent with home management  Medication management: wife assists Financial management: wife handles Handwriting: Moderate micrographia at sentence level  MOBILITY STATUS: mod I with U-step  POSTURE COMMENTS:  rounded shoulders and forward head   FUNCTIONAL OUTCOME MEASURES: Fastening/unfastening 3 buttons: 1 min 43 secs   COORDINATION: 9 Hole Peg test: Right: 55.08 sec; Left: 49.17 sec Box and Blocks:  Right 26blocks, Left 34blocks  UE ROM:  A/ROM:shoulder flexion RUE-100 LUE105, elbow extension RUE-35 LUE -25, decreased bilateral supination, RUE 95% finger extension, maintains fingers flexed    SENSATION: WFL    COGNITION: Overall cognitive status: Impaired, slowed processing and short term memory deficits  OBSERVATIONS: Bradykinesia and  resting tremor RUE, dystonia RUE                                                                                                                    TREATMENT DATE: 08/22/23- UBE x 5 mins level 1 for conditioning. PWR! up rock and step in seated, mod v.c and demonstraiton. PWR! rock in standing min v.c 10-20 reps each. Marching in standing at the sink, min v.c for amplitude. See pt education  08/16/23- eval only    PATIENT EDUCATION: Education details:PWR! hands basic 4, 5-10 reps each, beginning coordination HEP see pt instructions Person  educated: Patient Education method: Explanation demonstration, v.c Education comprehension: verbalized understanding, returned demonstration, v.c  HOME EXERCISE PROGRAM: PWR! hands, beginning coordination-08/22/23  GOALS: Goals reviewed with patient? Yes  SHORT TERM GOALS: Target date: 09/14/23   I with PD specific HEP  Goal status: ongoing- needs reinforcement 08/22/23  2.  Pt will demonstrate understanding of adapted strategies/ AE to increase safety and I with ADLs/IADLs. Goal status: INITIAL  3.  Pt will demonstrate improved bilateral UE use as evidenced by increasing box and blocks score by 3 blocks bilaterally.   Goal status: INITIAL  4.  Pt will demonstrate improved bilateral UE functional use and ease with dressing as evidenced by decreasing 3 button/ unbutton test time by 5 secs.  Goal status: INITIAL  5.  Pt will demonstrate ability to retrieve a lightweight object at 105 shoulder flexion and -30 elbow extension with RUE.  Goal status: INITIAL  6.  Pt will demonstrate ability to retrieve a lightweight object at 110 shoulder flexion and -20 elbow extension with LUE  Goal status: INITIAL  LONG TERM GOALS: Target date: 11/08/23   Pt will demonstrate improved fine motor coordination for ADLs as evidenced by decreasing 9 hole peg test score for RUE by 4 secs  Goal status: INITIAL  2.  Pt will demonstrate improved fine motor coordination for ADLs as evidenced by decreasing 9 hole peg test score for LUE by 4 secs  Goal status: INITIAL   3.. Check PPT#4 and set goal prn          Goal status; inital   4.  Pt will verbalize understanding of ways to prevent future PD related complications  Goal status: INITIAL  5.  Pt will write a short paragraph with 100% legibility and minimal decrease in letter size  Goal status: INITIAL  6. Pt will perform PPT#2 in 15 secs or less Baseline: 17.02 Goal status: initial  ASSESSMENT:  CLINICAL IMPRESSION: Patient is  progressing towards goals He demonstrates understanding of beginning HEP.SABRA   PERFORMANCE DEFICITS: in functional skills including ADLs, IADLs, coordination, dexterity, tone, ROM, strength, flexibility, Fine motor control, Gross motor control, mobility, balance, endurance, decreased knowledge of precautions, decreased knowledge of use of DME, vision, and UE functional use, cognitive skills including attention, memory, problem solving, safety awareness, sequencing, and thought, and psychosocial skills including coping strategies, environmental adaptation, habits, interpersonal interactions, and routines and behaviors.  IMPAIRMENTS: are limiting patient from ADLs, IADLs, rest and sleep, play, leisure, and social participation.   COMORBIDITIES:  may have co-morbidities  that affects occupational performance. Patient will benefit from skilled OT to address above impairments and improve overall function.  MODIFICATION OR ASSISTANCE TO COMPLETE EVALUATION: Min-Moderate modification of tasks or assist with assess necessary to complete an evaluation.  OT OCCUPATIONAL PROFILE AND HISTORY: Detailed assessment: Review of records and additional review of physical, cognitive, psychosocial history related to current functional performance.  CLINICAL DECISION MAKING: Moderate - several treatment options, min-mod task modification necessary  REHAB POTENTIAL: Good  EVALUATION COMPLEXITY: Low    PLAN:  OT FREQUENCY: 2x/week  OT DURATION: 12 weeks  PLANNED INTERVENTIONS: 97168 OT Re-evaluation, 97535 self care/ADL training, 02889 therapeutic exercise, 97530 therapeutic activity, 97112 neuromuscular re-education, 97140 manual therapy, 97116 gait training, 02886 aquatic therapy, 97035 ultrasound, 97018 paraffin, 02989 moist heat, 97010 cryotherapy, 97034 contrast bath, 97129 Cognitive training (first 15 min), 02869 Cognitive training(each additional 15 min), passive range of motion, balance training, functional  mobility training, visual/perceptual remediation/compensation, energy conservation, coping strategies training, patient/family education, and DME and/or AE instructions  RECOMMENDED OTHER SERVICES: n/a  CONSULTED AND AGREED WITH PLAN OF CARE: Patient  PLAN FOR NEXT SESSION: assess PPT #4, ADL strategies   Britlee Skolnik, OT 08/22/2023, 3:13 PM

## 2023-08-22 NOTE — Patient Instructions (Signed)
 PWR! Hands  With arms stretched out in front of you (elbows straight), perform the following: PWR! Rock: Move wrists up and down [place]! Twist: Twist palms up and down BIG  Then, start with elbows bent and hands closed. PWR! Step: Touch index finger to thumb while keeping other fingers straight. Flick fingers out BIG (thumb out/straighten fingers). Repeat with other fingers. (Step your thumb to each finger). PWR! Hands: Push hands out BIG. Elbows straight, wrists up, fingers open and spread apart BIG. (Can also perform by pushing down on table, chair, knees. Push above head, out to the side, behind you, in front of you.)   ** Make each movement big and deliberate so that you feel the movement.  Perform at least 10 repetitions 1x/day, but perform PWR! hands throughout the day when you are having trouble using your hands (picking up/manipulating small objects, writing, eating, typing, sewing, buttoning, etc.).   Coordination Exercises  Perform the following exercises for 10 minutes 3x week Perform with both hand(s). Perform using big movements.  Flipping Cards: Place deck of cards on the table. Flip cards over by opening your hand big to grasp and then turn your palm up big. Deal cards: Hold 1/2 or whole deck in your hand. Use thumb to push card off top of deck with one big push. Rotate ball with fingertips: Pick up with fingers/thumb and move as much as you can with each turn/movement (clockwise and counter-clockwise). Practice writing: Slow down, write big, and focus on forming each letter. Perform Flicks/hand stretches (PWR! Hands): Close hands then flick out your fingers with focus on opening hands, pulling wrists back, and extending elbows like you are pushing.

## 2023-08-23 ENCOUNTER — Encounter: Payer: Self-pay | Admitting: Speech Pathology

## 2023-08-23 ENCOUNTER — Ambulatory Visit

## 2023-08-23 ENCOUNTER — Ambulatory Visit: Admitting: Speech Pathology

## 2023-08-23 DIAGNOSIS — G20B2 Parkinson's disease with dyskinesia, with fluctuations: Secondary | ICD-10-CM

## 2023-08-23 DIAGNOSIS — R262 Difficulty in walking, not elsewhere classified: Secondary | ICD-10-CM

## 2023-08-23 DIAGNOSIS — R471 Dysarthria and anarthria: Secondary | ICD-10-CM

## 2023-08-23 DIAGNOSIS — R278 Other lack of coordination: Secondary | ICD-10-CM

## 2023-08-23 DIAGNOSIS — R2689 Other abnormalities of gait and mobility: Secondary | ICD-10-CM

## 2023-08-23 DIAGNOSIS — R29818 Other symptoms and signs involving the nervous system: Secondary | ICD-10-CM

## 2023-08-23 NOTE — Therapy (Signed)
 OUTPATIENT SPEECH LANGUAGE PATHOLOGY TREATMENT   Patient Name: Patrick Ross MRN: [id number] DOB:Jul 03, 1945, 78 y.o., male Today's Date: 08/23/2023  PCP: Millicent Sharper, MD REFERRING PROVIDER: Evonnie Asberry RAMAN, DO  Speech Therapy Progress Note  Dates of Reporting Period: 07/06/23 to present  Subjective Statement: Pt reports improvement in voice. Has not been as consistent with other cognitive tasks.   Objective: See previous tx notes  Goal Update: Making progress towards goals.   Plan: Cont with skilled services  Reason Skilled Services are Required: to maximize functional communication   END OF SESSION:  End of Session - 08/23/23 1234     Visit Number 12    Number of Visits 17    Date for SLP Re-Evaluation 09/06/23    SLP Start Time 1232    SLP Stop Time  1312    SLP Time Calculation (min) 40 min    Activity Tolerance Patient tolerated treatment well          Past Medical History:  Diagnosis Date   Depression    DVT (deep venous thrombosis) (HCC)    GERD (gastroesophageal reflux disease)    History of kidney stones    passed   Hypertension    Hypothyroidism    Lumbar stenosis    OSA (obstructive sleep apnea)    wears cpap   Parkinson's disease (HCC)    PONV (postoperative nausea and vomiting)    REM behavioral disorder    Urinary incontinence    Past Surgical History:  Procedure Laterality Date   BACK SURGERY     x 2 - not fusion   BIOPSY  06/01/2022   Procedure: BIOPSY;  Surgeon: Dianna Specking, MD;  Location: [facility];  Service: Gastroenterology;;   COLONOSCOPY     DEEP BRAIN STIMULATOR PLACEMENT     left 12/27/2012, right 12/10/2011, left revision 02/02/2013, replacement bilateral 11/21/2015   ESOPHAGOGASTRODUODENOSCOPY (EGD) WITH PROPOFOL  N/A 06/01/2022   Procedure: ESOPHAGOGASTRODUODENOSCOPY (EGD) WITH PROPOFOL ;  Surgeon: Dianna Specking, MD;  Location: [facility];  Service: Gastroenterology;  Laterality: N/A;   MINOR PLACEMENT OF FIDUCIAL  N/A 01/09/2019   Procedure: Fiducial placement;  Surgeon: Unice Pac, MD;  Location: [facility] OR;  Service: Neurosurgery;  Laterality: N/A;  Fiducial placement   PARTIAL NEPHRECTOMY Right 2005 ish   PULSE GENERATOR IMPLANT Right 01/23/2019   Procedure: Right chest implantable pulse generator change;  Surgeon: Unice Pac, MD;  Location: [facility] OR;  Service: Neurosurgery;  Laterality: Right;   SPINAL FUSION     lumbar Fusion x 2   SUBTHALAMIC STIMULATOR BATTERY REPLACEMENT Left 02/02/2018   Procedure: Change implantable pulse generator battery, Left chest;  Surgeon: Unice Pac, MD;  Location: [facility] OR;  Service: Neurosurgery;  Laterality: Left;   SUBTHALAMIC STIMULATOR BATTERY REPLACEMENT N/A 01/22/2020   Procedure: REMOVAL OF LEFT BRAIN DEEP BRAIN STIMULATOR ELECTRODE, EXTENSION, AND PULSE GENERATOR;  Surgeon: Unice Pac, MD;  Location: [facility] OR;  Service: Neurosurgery;  Laterality: N/A;   SUBTHALAMIC STIMULATOR BATTERY REPLACEMENT Right 02/23/2022   Procedure: CHANGE IPG BATTERY, RT CHEST;  Surgeon: Dawley, Lani JAYSON, DO;  Location: [facility] OR;  Service: Neurosurgery;  Laterality: Right;   SUBTHALAMIC STIMULATOR INSERTION Left 01/16/2019   Procedure: Left deep brain stimulator electrode revision.;  Surgeon: Unice Pac, MD;  Location: [facility] OR;  Service: Neurosurgery;  Laterality: Left;   SUBTHALAMIC STIMULATOR INSERTION Left 01/23/2019   Procedure: Repositioning of Left Deep Brain Stimulator Electrode;  Surgeon: Unice Pac, MD;  Location: [facility] OR;  Service: Neurosurgery;  Laterality: Left;  TONSILLECTOMY     Patient Active Problem List   Diagnosis Date Noted   Melena 06/01/2022   Acute cystitis without hematuria 05/31/2022   Upper GI bleed 05/31/2022   Generalized weakness 05/30/2022   Abdominal pain 05/30/2022   OSA (obstructive sleep apnea) 05/30/2022   Hypothyroidism 05/30/2022   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) 12/14/2021   Mixed hyperlipidemia 12/14/2021   Essential  hypertension 12/14/2021   Palpitations 12/14/2021   Angina pectoris (HCC) 12/14/2021   Urge incontinence 01/15/2021   History of UTI 01/15/2021   Abnormal urine findings 01/15/2021   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   History of nephrolithiasis 01/15/2021   DVT, lower extremity, proximal, acute, right (HCC) 02/26/2020   Open scalp wound 01/22/2020   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HCC)    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   Parkinson's disease (HCC) 01/16/2018   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  Pt accompanied by: significant other; [name]  PERTINENT HISTORY: PD (hx of STN DBS), depression, sleep apnea  PAIN:  Are you having pain? Yes: NPRS scale: 2 Pain location: hip Pain description: chronic Aggravating factors: sitting up Relieving factors: N/A  FALLS: Has patient fallen in last 6 months?  Yes  LIVING ENVIRONMENT: Lives with: lives with their family; 1 Lives in: House/apartment  PLOF:  Level of assistance: Independent with ADLs, Independent with IADLs Employment: Retired  PATIENT GOALS: voice, cognition  OBJECTIVE:  Note: Objective measures were completed at Evaluation unless otherwise noted.  DIAGNOSTIC FINDINGS: No imaging on this EMR since 2021 CT Head.   COGNITION: Overall cognitive status: Impaired Areas of impairment:  Attention: Impaired: Selective, Alternating, Divided Memory: Impaired: Short term Prospective Executive function: Impaired: Organization and Planning Functional deficits: Difficulty with recall of details  MOTOR SPEECH: Overall motor speech: impaired Level of impairment:  Conversation Respiration: Reduced support Phonation: normal Resonance: WFL Articulation: Impaired: conversation Intelligibility: Intelligibility reduced Motor planning: Appears intact Motor speech errors: NA Interfering components: NA Effective technique: slow rate, increased vocal intensity, and over articulate  ORAL MOTOR EXAMINATION: Overall status: WFL Comments:    CLINICAL SWALLOW ASSESSMENT:   Current diet: regular and thin liquids Dentition: adequate natural dentition Patient directly observed with POs: Yes: thin liquids  Feeding: able to feed self Liquids provided by: cup and straw Oral phase signs and symptoms: NA Pharyngeal phase signs and symptoms: multiple swallows Comments: [facility] Swallow Protocol PASS   *Pt reports some difficulty with drier foods and occasionally getting strangled with liquids. No observed this date. To monitor.   SOCIAL HISTORY: Occupation: Warden/ranger intake: suboptimal Caffeine/alcohol intake: minimal Daily voice use: minimal  PERCEPTUAL VOICE ASSESSMENT: Voice quality: normal Vocal abuse: NA Resonance: normal Respiratory function: thoracic breathing  LSVT-LOUD VOICE EVALUATION: Maximum phonation time for sustained "ah": 13 sec Mean intensity during sustained "ah": 80 dB  Mean fundamental frequency during sustained "ah": 111 Hz  Mean intensity sustained during conversational speech: 70 dB Habitual pitch: 110 Hz Highest dynamic pitch when altering pitch from a low note to a high note: 175 Hz Lowest dynamic pitch when altering from a high note to a low note: 106 Hz Highest dynamic pitch during conversational speech: 159 Hz Lowest dynamic pitch  during conversational speech: 82 Hz Average time patient was able to sustain /s/: 4.8 seconds Average time patient was able to sustain /z/: 13.8 seconds s/z ratio : .34 (indicating suspected respiratory inefficiency)   Speech is characterized by Other: . For trained listener in quiet environment  with context, intelligibility was >90%.    Patient able to improve all parameters with model ("Loud like me") Stimulability: Improved vocal quality with loud voice (81 dB) for sustained vowel, functional phrases averaged 78 dB and improved pitch range with model.     The Communication Effectiveness Survey is a patient-reported outcome measure in which the patient rates their own effectiveness in different communication situations. A higher score indicates greater effectiveness. Pt's self-rating was 27/32. Patient reported mild difficulty conversing with a telephone, conversing in a noisy environment, speaking when emotional, conversing when traveling in a car, having conversation with someone at a distance.   STANDARDIZED ASSESSMENTS: SLUMS: 24/30  PATIENT REPORTED OUTCOME MEASURES (PROM): Cognitive function: Short Form: 34 and Communication Effectiveness Survey: 27                                                                                                                            TREATMENT DATE:   08/23/23: Pt was seen for skilled ST services targeting dysarthria and respiratory muscle strength training. Pt reports he had difficulty this morning where the word got stuck when he was asked a question. He reported he felt like his mouth was dry. Pt had previously reported that he does not talk much in the mornings before his classes. SLP suggested trying his SPEAK OUT lesson prior to his class to see if this helps warm him up. Pt reports it taking increased effort to complete EMST after increasing cmH20. Pt required minA to blow for longer duration into his device, as his blow out has gotten shorter with increased intensity.   Complete 5 sets of 5 on EMST. Pt is continuing voice exercises. Pt with questions regarding journaling template. SLP educated on how to produce bullet points. Pt has been consistently journaling since his last visit. To cont.   08/18/23: Pt was seen for skilled ST  services targeting dysarthria and respiratory muscle strength training. SLP increased cmH20 by clockwise 1/4 turn. We completed 5 sets of 5 at the new level (37 cmH20). Pt rated effort level 7 out of 10 In the zone. Pt continues to use his paper to check off when he completes his daily exercises. SLP facilitated memory by encouraging pt to use speech to text to complete a journal entry. Pt was able to do with minA. Wife reportsimprovement in communication at home and that he has been more aware of when he begins to trail off in the conversation.    08/16/23: Pt was seen for skilled ST services targeting dysarthria. Pt was able to recall how his voice should be sounding when doing his exercises. Pt completed exercises this date - pt reported he benefited from  therapist hand moving as he glided up and down the scale on /a/. Pt required modA verbal cues to complete exercises - specifically the ones requiring cognition. To continue next session.   08/11/23: Pt was seen for skilled ST services targeting dysarthria. Pt completed 5 sets of 5 on EMST, as he had not completed his exercises yet today. He required minA verbal cues to complete correctly. SLP demonstrated how to use speech-to-text and notes function on his iPad. Pt reported he would like to write a routine to follow in the morning. We completed morning routines for re: mon/wed/fri and tu/th. SLP also demonstrated how notes can be used to write a daily journal as pt reports he has difficulty recalling things from day to day. To cont next session/  08/09/23: Pt was seen for skilled ST services targeting dysarthria. Pt completed 5 sets of 5 on EMST today. He required min verbal cueing to complete task. Pt has been keeping up with EMST and SPEAK OUT exercises using his template for keeping track of reps/dates. SLP initiated memory strategy education this session. Pt reports he could benefit from several external memory strategies. To bring in phone next  session for SLP demonstration of speech-to-text.   08/05/23: Pt was seen for skilled ST services targeting dysarthria. Pt completed 5 sets of 5 on EMST today. He required min-mod verbal cueing to complete task. Pt required SLP to keep count of breaths and encouragement to pause in between. Pt completed voice exercises with therapist today.   Warm up: 82 dB avg  Reading: 81 dB avg  Cognitive Exercises 79 dB avg   08/02/23: Pt was seen for skilled ST services targeting dysarthria. Pt reports he has completed his EMST today. SLP led pt through warm up exercises re: diaphragmatic breathing; may, me, my, mo, moo; sustained phonation, and pitch glides. SLP instructed pt to project voice (think loud; speak over the fence) during warm up. Pt read instructions for SPEAK OUT and performed exercises with minA. He required occasional verbal cues to continue with intent for entirety of reading exercise vs trailing off. Pt participated in structured conversation with consistent cues for increasing loudness.   07/28/23: Pt was seen for skilled ST services targeting education for wife and design of templates for EMST, SPEAK OUT, and Medication log due to pt having difficulty remembering to complete exercises. SLP encouraged wife to help patient set up a memory and exercise station with calendar, medication log, and exercise log. Pt completed reading and cognitive portion of SPEAK OUT while SLP designed templates. Provided in clear sheet protector at end of session. Pt to bring EMST next session.   07/21/23: Pt was seen for skilled ST services targeting review of SPEAK OUT. Pt reports he did not complete any voice exercises due to company this week. SLP explained the importance of consistency with exercises for demonstrated improvement. He has not completed his EMST exercises in over a year. SLP reassessed on his personal device. Pt encouraged to set a routine to complete voice and respiratory exercises.    EMST  150 3X5 reps @ 75% of MEP - 30 cmH20 with 15 second rest  (45 cmH20 MEP)   07/12/23: Pt was seen for skilled ST services targeting review of SPEAK OUT. SLP educated on Living with Intent Means To... found in his HEP book. Patient reported that speaking with conscious effort resonated the most with him and his idea of intent. SLP led patient through vocal warm up re: May-Me-My-Moe-Mo x5, sustained  phonation /ah/ x5, pitch glides x5/each. Pt completed cognitive component of SPEAK OUT book independently (up and ___). Pt demonstrated completion of SPEAK OUT  exercises with spv. SLP set pt up with a system to separate his completion from previous therapy course (using pencil and X marks vs. Pen and Check Mark). Pt completed Lesson 1 with therapist for AM portion - to complete PM portion at home this evening.   07/08/23: Pt was seen for skilled ST services targeting continued voice assessment - see above. Pt also completed PROMs for cognitive function and dysarthria. Pt brought in his Speak Out book from previous therapist. SLP encouraged pt to continue with exercises. SLP is not speak out certified, but plans to use similar cues with exemplars from book for continuity of care.  PATIENT EDUCATION: Education details: PD and SLP role  Person educated: Patient and Spouse Education method: Explanation Education comprehension: verbalized understanding   GOALS: Goals reviewed with patient? Yes  SHORT TERM GOALS: Target date: 08/07/23  Pt will complete baseline voice measures Baseline: Goal status:MET  2.  Pt will complete PROMs Baseline:  Goal status: MET  3.  Pt will demonstrate voice HEP independently Baseline:  Goal status: INITIAL  4.  Pt/wife will verbalize 3 memory strategies to improve recall of important information Baseline:  Goal status: INITIAL  5.   Baseline:  Goal status: INITIAL  6.   Baseline:  Goal status: INITIAL  LONG TERM GOALS: Target date: 09/06/23  Pt will  improve score on PROMs Baseline:  Goal status: INITIAL  2.  Pt will demonstrate improved vocal intensity with sustained phonation Baseline:  Goal status: INITIAL  3.  Pt will demonstrate improved vocal intensity in 5 min, unstructured conversation Baseline:  Goal status: INITIAL  4.  Pt/wife will report success using memory strategies at home Baseline:  Goal status: INITIAL  5.   Baseline:  Goal status: INITIAL  6.   Baseline:  Goal status: INITIAL  ASSESSMENT:  CLINICAL IMPRESSION: Pt is a 78 yo male who presents to ST OP for evaluation for sx 2/2 Parkinson's Disease.  Pt endorses changes in vocal intensity, breath support, and cognition re: focus, memory. Pt has received speech therapy before at [facility] ~2 years ago. Wife reports he has been referred for a neuropsych evaluation for cognition; therefore, SLP to just complete a screen at this time. Pt was assessed using SLUMS - scoring a 24/30. Impairments noted in recall and word generation. Pt with complaints of occasional difficulty with thin liquids and solids. Pt passed YSP, with no overt s/sx of aspiration. Pt reports he has some difficulty with drier solids getting stuck. SLP suggested moister meats re: chicken thighs vs chicken breast, cutting items into smaller pieces, and adding sauces/gravies to drier foods. Unable to complete voice measures due to time constraints. To complete next session. SLP rec skilled ST services to address cognitive-communication impairment, dysarthria, and monitor dysphagia.    OBJECTIVE IMPAIRMENTS: include attention, memory, executive functioning, and dysarthria. These impairments are limiting patient from effectively communicating at home and in community. Factors affecting potential to achieve goals and functional outcome are NA. Patient will benefit from skilled SLP services to address above impairments and improve overall function.  REHAB POTENTIAL: Good  PLAN:  SLP  FREQUENCY: 1-2x/week  SLP DURATION: 8 weeks  PLANNED INTERVENTIONS: Diet toleration management , Cueing hierachy, Internal/external aids, Functional tasks, Multimodal communication approach, SLP instruction and feedback, Compensatory strategies, Patient/family education, and [id number] Treatment of speech (30 or  45 min)     [employer], CCC-SLP 08/23/2023, 12:35 PM

## 2023-08-23 NOTE — Therapy (Signed)
 OUTPATIENT PHYSICAL THERAPY NEURO TREATMENT Progress Note Reporting Period 07/06/23 to 08/23/23  See note below for Objective Data and Assessment of Progress/Goals.      Patient Name: Patrick Ross MRN: [id number] DOB:1945/07/02, 78 y.o., male Today's Date: 08/23/2023   PCP: Millicent Sharper MD REFERRING PROVIDER: Evonnie Asberry RAMAN DO  END OF SESSION:  PT End of Session - 08/23/23 1302     Visit Number 10    Authorization Type Medicare    PT Start Time 1315    PT Stop Time 1400    PT Time Calculation (min) 45 min                   Past Medical History:  Diagnosis Date   Depression    DVT (deep venous thrombosis) (HCC)    GERD (gastroesophageal reflux disease)    History of kidney stones    passed   Hypertension    Hypothyroidism    Lumbar stenosis    OSA (obstructive sleep apnea)    wears cpap   Parkinson's disease (HCC)    PONV (postoperative nausea and vomiting)    REM behavioral disorder    Urinary incontinence    Past Surgical History:  Procedure Laterality Date   BACK SURGERY     x 2 - not fusion   BIOPSY  06/01/2022   Procedure: BIOPSY;  Surgeon: Dianna Specking, MD;  Location: [facility];  Service: Gastroenterology;;   COLONOSCOPY     DEEP BRAIN STIMULATOR PLACEMENT     left 12/27/2012, right 12/10/2011, left revision 02/02/2013, replacement bilateral 11/21/2015   ESOPHAGOGASTRODUODENOSCOPY (EGD) WITH PROPOFOL  N/A 06/01/2022   Procedure: ESOPHAGOGASTRODUODENOSCOPY (EGD) WITH PROPOFOL ;  Surgeon: Dianna Specking, MD;  Location: [facility];  Service: Gastroenterology;  Laterality: N/A;   MINOR PLACEMENT OF FIDUCIAL N/A 01/09/2019   Procedure: Fiducial placement;  Surgeon: Unice Pac, MD;  Location: [facility] OR;  Service: Neurosurgery;  Laterality: N/A;  Fiducial placement   PARTIAL NEPHRECTOMY Right 2005 ish   PULSE GENERATOR IMPLANT Right 01/23/2019   Procedure: Right chest implantable pulse generator change;  Surgeon: Unice Pac, MD;  Location:  [facility] OR;  Service: Neurosurgery;  Laterality: Right;   SPINAL FUSION     lumbar Fusion x 2   SUBTHALAMIC STIMULATOR BATTERY REPLACEMENT Left 02/02/2018   Procedure: Change implantable pulse generator battery, Left chest;  Surgeon: Unice Pac, MD;  Location: [facility] OR;  Service: Neurosurgery;  Laterality: Left;   SUBTHALAMIC STIMULATOR BATTERY REPLACEMENT N/A 01/22/2020   Procedure: REMOVAL OF LEFT BRAIN DEEP BRAIN STIMULATOR ELECTRODE, EXTENSION, AND PULSE GENERATOR;  Surgeon: Unice Pac, MD;  Location: [facility] OR;  Service: Neurosurgery;  Laterality: N/A;   SUBTHALAMIC STIMULATOR BATTERY REPLACEMENT Right 02/23/2022   Procedure: CHANGE IPG BATTERY, RT CHEST;  Surgeon: Dawley, Lani JAYSON, DO;  Location: [facility] OR;  Service: Neurosurgery;  Laterality: Right;   SUBTHALAMIC STIMULATOR INSERTION Left 01/16/2019   Procedure: Left deep brain stimulator electrode revision.;  Surgeon: Unice Pac, MD;  Location: [facility] OR;  Service: Neurosurgery;  Laterality: Left;   SUBTHALAMIC STIMULATOR INSERTION Left 01/23/2019   Procedure: Repositioning of Left Deep Brain Stimulator Electrode;  Surgeon: Unice Pac, MD;  Location: [facility] OR;  Service: Neurosurgery;  Laterality: Left;   TONSILLECTOMY     Patient Active Problem List   Diagnosis Date Noted   Melena 06/01/2022   Acute cystitis without hematuria 05/31/2022   Upper GI bleed 05/31/2022   Generalized weakness 05/30/2022   Abdominal pain 05/30/2022   OSA (obstructive sleep apnea) 05/30/2022  Hypothyroidism 05/30/2022   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) 12/14/2021   Mixed hyperlipidemia 12/14/2021   Essential hypertension 12/14/2021   Palpitations 12/14/2021   Angina pectoris (HCC) 12/14/2021   Urge incontinence 01/15/2021   History of UTI 01/15/2021   Abnormal urine findings 01/15/2021   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   History of nephrolithiasis 01/15/2021   DVT, lower extremity, proximal, acute, right (HCC) 02/26/2020   Open scalp  wound 01/22/2020   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HCC)    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   Parkinson's disease (HCC) 01/16/2018   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) 01/16/2018   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    ONSET DATE: exacerbation 06/21/23, Parkinson's diagnosis 2010  REFERRING DIAG: parkinsons disease with fluctuating dyskinesia  THERAPY DIAG:  Other lack of coordination  Parkinson's disease with dyskinesia, with fluctuations (HCC)  Other abnormalities of gait and mobility  Other symptoms and signs involving the nervous system  Difficulty in walking, not elsewhere classified  Rationale for Evaluation and Treatment: Rehabilitation  SUBJECTIVE:                                                                                                                                                                                             SUBJECTIVE STATEMENT: I am tired today, rode 7 miles in the morning.     EVAL-Pt and his wife report long history of parkinson's,  biggest difficulty with his mobility that they note is R lower back  pain and labored supine to sit movement, also poor foot clearance.  He doesn't like relying on the walker Pt accompanied by: significant other  PERTINENT HISTORY: parkinson's diagnosed greater than 10 yrs ago, deep brain stimulator R, had in L as well but had to be removed for various reasons.  walks around neighborhood 3 x week with aide Tuesdays and Thursdays parkisons bicycle class 2 x Drue training for upper body work out Referred to PT and ST by neurologist   PAIN:  Are you having pain? R lower back pain constant  PRECAUTIONS: Fall and Other: deep brain stimulator on R   RED FLAGS: None   WEIGHT BEARING RESTRICTIONS: No  FALLS: Has patient fallen in last 6 months? No  LIVING ENVIRONMENT: Lives with: lives with their spouse Lives in: House/apartment Stairs: has internal steps, but has had home renovated and adapted so the he  and his wife live in downstairs apartment, all one level with bathroom and kitchen Has following equipment at home: Single point cane, Environmental consultant - 4 wheeled, Shower bench, Grab bars, and Ramped entry  PLOF: Independent  PATIENT GOALS: improve bed mobility and improve  gait, stability, avoid falls  OBJECTIVE:  Note: Objective measures were completed at Evaluation unless otherwise noted.  DIAGNOSTIC FINDINGS: see neuro notes, has deep brain stimulator R   COGNITION: Overall cognitive status: Within functional limits for tasks assessed   SENSATION: WFL  COORDINATION: Slowed movements B and noted curling R hand and R foot at times, with gait festinating on turns   EDEMA:  None noted  MUSCLE TONE: noted curling R fingers, toes, knee flex and hip flex at times, randomly during evaluation  MUSCLE LENGTH: Hamstrings: Right -30 deg; Left -30 deg Thomas test: Right nt deg; Left nt deg    POSTURE: Standing with some forward lean, weight on forefeet, L calf atrophied pt and wife report chronic after L lumbar surgery.     LOWER EXTREMITY ROM:   grossly wfl, hips not assessed as not positioned on mat today   LOWER EXTREMITY MMT:    MMT Right Eval Left Eval  Hip flexion 4 4-  Hip extension    Hip abduction    Hip adduction    Hip internal rotation    Hip external rotation    Knee flexion 4 4  Knee extension 4 4-  Ankle dorsiflexion 4 3+  Ankle plantarflexion 4- 3-  Ankle inversion    Ankle eversion    (Blank rows = not tested)  BED MOBILITY:  Not tested  TRANSFERS: I but slowed, uses hands on seat or with rollator  GAIT: Findings: Gait Characteristics: step to pattern, decreased step length- Right, decreased step length- Left, decreased stride length, decreased hip/knee flexion- Right, decreased hip/knee flexion- Left, shuffling, festinating, trunk flexed, and narrow BOS, Distance walked: 100', Assistive device utilized:Walker - 4 wheeled, Level of assistance: SBA, and  Comments: festinating noted on turns, tends to turn L  FUNCTIONAL TESTS:  30 seconds chair stand test 6 reps Timed up and go (TUG): 33.39 with rollator Standing static feet together 15 sec Standing semi tandem 10 sec each leg, needed CGA with L foot forward Standing marching, required B hand hold support to maintain balance                                                                                                                              TREATMENT DATE: 08/23/23 Recheck goals - 5xSTS - TUG - MMT NuStep L5x49mins  Modified sit ups holding yellow 2x10 Box tap 6 Step ups 6 Balance on airex feet together, eyes open 220s and then closed 10   08/18/23 Leg ext 10# 2x10 HS curls 25# 2x10 OHP press yellow ball 2x10 Supine LE stretching- HS, LTR, SKTC  NuStep L5 x66mins    08/16/23 Holding big red ball tapping floor to overhead to side to side x5 Standing shoulder rows and ext green 2x10 Standing shoulder flexion with 2# WaTE 2x10 Box taps 4 holding on and then without walker CGA-minA  NuStep L5x67mins STS 2x10   08/11/23 NuStep L5x20mins  Fitter pushes 2 blue  bands 2x10  Side steps over obstacles in bars  On airex reaching for numbers  Isometric ab push downs 2x10   08/09/23 NuStep L5x64mins  STS with chest press red ball 2x8 Standing march 3#  3# hip abd 2x10  Horizontal abd green 2x10 Leg ext 10# 2x10 HS curls 25# 2x10    08/04/23 30s chair test TUG 27s  MMT 4/5 Standing shoulder ext and rows green band 2x10 Standing shoulder flexion 2# WaTE x10 - stopped due to feeling dizzy and off balance  NuStep L5 x60mins  Seated rotation with yellow ball 2x10   08/02/23 NuStep L5x70mins  Side steps over obstacles in bars Forwards steps over obstacles in bars Calf raises x10 Box taps 4 1HRA Step ups 4 2HRA  STS with OHP 2x5  07/28/23 Supine HS stretch and SKTC  Lower trunk rotations x10 SLR 2x10 Feet on pball rotations and knees to chest AB isometric holds  x10  NuStep L5x82mins  STS 3x5   07/27/23 NuStep L5x7mins  LAQ 2# 2x10 HS curls green 2x10  Hip abduction green 2x10 STS 2x10  Stretching with pball rotations and flexion x5     07/06/23 Evaluation, advised to attempt prone lying on bed 10 to 15 min  to stretch body into extension( pt doesn't ever lie prone)   PATIENT EDUCATION: Education details: POC, goals Person educated: Patient and Spouse Education method: Explanation, Demonstration, Tactile cues, and Verbal cues Education comprehension: verbalized understanding, verbal cues required, and needs further education  HOME EXERCISE PROGRAM: Access Code: [email or web address] URL: [email or web address] Date: 08/02/2023 Prepared by: Almetta Fam  Exercises - Supine Hamstring Stretch with Strap  - 2 x daily - 7 x weekly - 2 reps - 15 hold - Modified Thomas Stretch  - 2 x daily - 7 x weekly - 2 reps - 15 hold - Supine Bridge  - 1 x daily - 7 x weekly - 2 sets - 10 reps - Standing Shoulder Horizontal Abduction with Resistance  - 1 x daily - 7 x weekly - 2 sets - 10 reps  GOALS: Goals reviewed with patient? Yes  SHORT TERM GOALS: Target date: 4 weeks, 08/03/23  I home program for posture and for hamstring, hip flexor stretchig Baseline: Goal status: ongoing 08/02/23, ongoing 08/23/23  LONG TERM GOALS: Target date: 09/28/23: 12 weeks   30 sec sit to stand improve from 6 reps to 12 reps for improved coordination, strength, endurance LEs Baseline:  Goal status: 7 reps ongoing 08/04/23, 8 reps 08/23/23  2.  I and pain free supine to sit motion , rolling to R Baseline: very painful per pt and wife history, not specifically assessed in clinic today Goal status: ongoing 07/28/23, ongoing 08/09/23, still has pain with bed mobility 08/23/23  3.  TUG score improve from 33 sec to 18 or less for reduced fall risk   Baseline:  Goal status: 27s ongoing 08/04/23, 22s 08/23/23  4.  Improve strength LE's for B quads, hamstrings to 4+5 for improved  functional strength Baseline: 4- Goal status: 4/5 08/04/23, 5/5 MET 08/23/23   ASSESSMENT:  CLINICAL IMPRESSION: 10th visit progress note, he is making good progress towards LTGs. Has met strength goal, still working on functional tests. Patient freezing and festinating gait with turns doing TUG. Continued to work on big movements and strengthening. Also did some balance today, which was difficult for him. He did seem to have more freezing today and needed cues to take large steps. Will continue to progress to improve his  overall function.    EVAL Patient is a 78 y.o. male who was evaluated today by physical therapy due to referral from his neurologist, with decline in function, particularly control of R extremities, with his Parkinson's diagnosis.  He and his wife have been proactive about maintaining his function, he attends a training/ strengthening session locally 2 x week , attends parkinson's cycling weekly, and walks around 0.6 miles with his aide 3 x week outdoors with his rollator.  They recently have adapted their home with a main floor renovation so that their bedroom, bath, kitchen are all on one level.   Main problems identified today are weakness B LE's, L ankle is weak chronically due to lumbar radiculopathy and B knee, hips musculature is weak.  He also has coordination deficits with festinating gait particularly on turns and static / dynamic balance deficits.  Has chronic R sided lower back pain which is particularly intense with moving supine to sit.  He should benefit from physical therapy intervention to address his deficits and update his current routine for stretching and strengthening at home.    OBJECTIVE IMPAIRMENTS: Abnormal gait, decreased activity tolerance, decreased balance, decreased coordination, decreased endurance, decreased mobility, difficulty walking, decreased strength, hypomobility, impaired perceived functional ability, impaired UE functional use, postural  dysfunction, and pain.   ACTIVITY LIMITATIONS: carrying, lifting, squatting, stairs, transfers, and locomotion level  PARTICIPATION LIMITATIONS: meal prep, cleaning, laundry, shopping, community activity, and church  PERSONAL FACTORS: Age, Behavior pattern, Fitness, Past/current experiences, Time since onset of injury/illness/exacerbation, and 1-2 comorbidities: h/o L lumbar radiculopathy and weakness L LE, chronic R sided lower back pain, are also affecting patient's functional outcome.   REHAB POTENTIAL: Good  CLINICAL DECISION MAKING: Evolving/moderate complexity  EVALUATION COMPLEXITY: Moderate  PLAN:  PT FREQUENCY: 2x/week  PT DURATION: 12 weeks  PLANNED INTERVENTIONS: 97110-Therapeutic exercises, 97530- Therapeutic activity, [id number]- Neuromuscular re-education, 97535- Self Care, and [id number]- Manual therapy  PLAN FOR NEXT SESSION: recheck goals for 10th visit PN   Almetta Fam, PT, DPT 08/23/2023, 1:57 PM

## 2023-08-24 ENCOUNTER — Other Ambulatory Visit: Payer: Self-pay | Admitting: Neurology

## 2023-08-24 ENCOUNTER — Ambulatory Visit: Admitting: Occupational Therapy

## 2023-08-24 DIAGNOSIS — R2689 Other abnormalities of gait and mobility: Secondary | ICD-10-CM

## 2023-08-24 DIAGNOSIS — R262 Difficulty in walking, not elsewhere classified: Secondary | ICD-10-CM | POA: Diagnosis not present

## 2023-08-24 DIAGNOSIS — F330 Major depressive disorder, recurrent, mild: Secondary | ICD-10-CM

## 2023-08-24 DIAGNOSIS — R41844 Frontal lobe and executive function deficit: Secondary | ICD-10-CM

## 2023-08-24 DIAGNOSIS — R41840 Attention and concentration deficit: Secondary | ICD-10-CM

## 2023-08-24 DIAGNOSIS — R278 Other lack of coordination: Secondary | ICD-10-CM

## 2023-08-24 DIAGNOSIS — R29818 Other symptoms and signs involving the nervous system: Secondary | ICD-10-CM

## 2023-08-24 NOTE — Therapy (Signed)
 OUTPATIENT OCCUPATIONAL THERAPY [facility] treatment  Patient Name: Patrick Ross MRN: [id number] DOB:[birth year], 78 y.o., male Today's Date: 08/24/2023  PCP: Dr. Millicent REFERRING PROVIDER:Dr. Tat  END OF SESSION:  OT End of Session - 08/24/23 1323     Visit Number 3    Number of Visits 25    Date for OT Re-Evaluation 11/08/23    Authorization Type MCR, [employer]    Authorization Time Period 12 weeks, anticipate d/c after 8 weeks    Authorization - Visit Number 3    Progress Note Due on Visit 10    OT Start Time 1317    OT Stop Time 1357    OT Time Calculation (min) 40 min    Activity Tolerance Patient tolerated treatment well    Behavior During Therapy WFL for tasks assessed/performed            Past Medical History:  Diagnosis Date   Depression    DVT (deep venous thrombosis) (HCC)    GERD (gastroesophageal reflux disease)    History of kidney stones    passed   Hypertension    Hypothyroidism    Lumbar stenosis    OSA (obstructive sleep apnea)    wears cpap   Parkinson's disease (HCC)    PONV (postoperative nausea and vomiting)    REM behavioral disorder    Urinary incontinence    Past Surgical History:  Procedure Laterality Date   BACK SURGERY     x 2 - not fusion   BIOPSY  06/01/2022   Procedure: BIOPSY;  Surgeon: Dianna Specking, MD;  Location: [facility];  Service: Gastroenterology;;   COLONOSCOPY     DEEP BRAIN STIMULATOR PLACEMENT     left 12/27/2012, right 12/10/2011, left revision 02/02/2013, replacement bilateral 11/21/2015   ESOPHAGOGASTRODUODENOSCOPY (EGD) WITH PROPOFOL  N/A 06/01/2022   Procedure: ESOPHAGOGASTRODUODENOSCOPY (EGD) WITH PROPOFOL ;  Surgeon: Dianna Specking, MD;  Location: [facility];  Service: Gastroenterology;  Laterality: N/A;   MINOR PLACEMENT OF FIDUCIAL N/A 01/09/2019   Procedure: Fiducial placement;  Surgeon: Unice Pac, MD;  Location: [facility] OR;  Service: Neurosurgery;  Laterality: N/A;  Fiducial placement   PARTIAL  NEPHRECTOMY Right 2005 ish   PULSE GENERATOR IMPLANT Right 01/23/2019   Procedure: Right chest implantable pulse generator change;  Surgeon: Unice Pac, MD;  Location: [facility] OR;  Service: Neurosurgery;  Laterality: Right;   SPINAL FUSION     lumbar Fusion x 2   SUBTHALAMIC STIMULATOR BATTERY REPLACEMENT Left 02/02/2018   Procedure: Change implantable pulse generator battery, Left chest;  Surgeon: Unice Pac, MD;  Location: [facility] OR;  Service: Neurosurgery;  Laterality: Left;   SUBTHALAMIC STIMULATOR BATTERY REPLACEMENT N/A 01/22/2020   Procedure: REMOVAL OF LEFT BRAIN DEEP BRAIN STIMULATOR ELECTRODE, EXTENSION, AND PULSE GENERATOR;  Surgeon: Unice Pac, MD;  Location: [facility] OR;  Service: Neurosurgery;  Laterality: N/A;   SUBTHALAMIC STIMULATOR BATTERY REPLACEMENT Right 02/23/2022   Procedure: CHANGE IPG BATTERY, RT CHEST;  Surgeon: Dawley, Lani JAYSON, DO;  Location: [facility] OR;  Service: Neurosurgery;  Laterality: Right;   SUBTHALAMIC STIMULATOR INSERTION Left 01/16/2019   Procedure: Left deep brain stimulator electrode revision.;  Surgeon: Unice Pac, MD;  Location: [facility] OR;  Service: Neurosurgery;  Laterality: Left;   SUBTHALAMIC STIMULATOR INSERTION Left 01/23/2019   Procedure: Repositioning of Left Deep Brain Stimulator Electrode;  Surgeon: Unice Pac, MD;  Location: [facility] OR;  Service: Neurosurgery;  Laterality: Left;   TONSILLECTOMY     Patient Active Problem List   Diagnosis Date Noted   Melena  06/01/2022   Acute cystitis without hematuria 05/31/2022   Upper GI bleed 05/31/2022   Generalized weakness 05/30/2022   Abdominal pain 05/30/2022   OSA (obstructive sleep apnea) 05/30/2022   Hypothyroidism 05/30/2022   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) 12/14/2021   Mixed hyperlipidemia 12/14/2021   Essential hypertension 12/14/2021   Palpitations 12/14/2021   Angina pectoris (HCC) 12/14/2021   Urge incontinence 01/15/2021   History of UTI 01/15/2021   Abnormal urine  findings 01/15/2021   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   History of nephrolithiasis 01/15/2021   DVT, lower extremity, proximal, acute, right (HCC) 02/26/2020   Open scalp wound 01/22/2020   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HCC)    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   Parkinson's disease (HCC) 01/16/2018   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) 01/16/2018   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 Pt accompanied by: self  PERTINENT HISTORY: parkinson's diagnosed greater than 10 yrs ago, deep brain stimulator R, had in L as well but had to be removed for various reasons. Depression, sleep apnea, hx of back surgery, see hx above walks around neighborhood 3 x week with aide Tuesdays and Thursdays parkisons bicycle class 2 x Drue training for upper body work out   PRECAUTIONS: Fall- PT is addressing  WEIGHT BEARING RESTRICTIONS: No  PAIN:  Are you having pain? Yes: NPRS scale: 2-3/10 Pain location: back, hip Pain description: aching Aggravating factors: malposiitoning Relieving factors: rest,   FALLS: Has patient fallen in last 6 months? No  LIVING ENVIRONMENT: Lives with: lives with their spouse, has CNA 3x weeks Lives in: House/apartment Stairs: Yes: Internal: has stairt lift, and coverted garage appt on ground level Has following equipment at home: U-step  PLOF: Needs assistance with  ADLs and Needs assistance with homemaking  PATIENT GOALS: Pt wants to be able to tie shoes, and more I with dressing  OBJECTIVE:  Note: Objective measures were completed at Evaluation unless otherwise noted.  HAND DOMINANCE: Right  ADLs: Overall ADLs: increased time required  Transfers/ambulation related to ADLs: Eating: difficulty cutting food, needs assist  Grooming: mod I with brushing teeth, combing, CNA assists with shaving UB Dressing: mod I LB Dressing: mod-max A for LB dressing Toileting: mod I with U-step Bathing: supervision-walk in shower, level has built in seat and grab bars Tub Shower transfers: min A Equipment: Grab bars and level walk in shower with built in bench  IADLs:Dependent with home management  Medication management: wife assists Financial management: wife handles Handwriting: Moderate micrographia at sentence level  MOBILITY STATUS: mod I with U-step  POSTURE COMMENTS:  rounded shoulders and forward head   FUNCTIONAL OUTCOME MEASURES: Fastening/unfastening 3 buttons: 1 min 43 secs   COORDINATION: 9 Hole Peg test: Right: 55.08 sec; Left: 49.17 sec Box and Blocks:  Right 26blocks, Left 34blocks  UE ROM:  A/ROM:shoulder flexion RUE-100 LUE105, elbow extension RUE-35 LUE -25, decreased bilateral supination, RUE 95% finger extension, maintains fingers flexed    SENSATION: [facility]  COGNITION: Overall cognitive status: Impaired, slowed processing and short term memory deficits  OBSERVATIONS: Bradykinesia and resting tremor RUE, dystonia RUE                                                                                                                    TREATMENT DATE: 08/24/23-PWR!modified quadraped rock at counter then standing PWR! rock to targets, min-mod v.c for amplitude, min facilitation Seated eedge of met, Doffing then donning sock and shoes with big movments, mod v.c and min-mod facillitaion. Pt tried sock aide to donn left sock,  hoever ift was easier to donn right sock manually without the sock aide. Increased time required  Pt attempted to fasten buttons on tabletop, however max difficulty despite v.c so task was discontinued. PWR! hands, followed by picking up coins to place in coin slot, min difficulty v.c  08/22/23- UBE x 5 mins level 1 for conditioning. PWR! up rock and step in seated, mod v.c and demonstraiton. PWR! rock in standing min v.c 10-20 reps each. Marching in standing at the sink, min v.c for amplitude. See pt education  08/16/23- eval only    PATIENT EDUCATION: Education details:Big movements with donning/ doffing shoes Person educated: Patient Education method: Scientist, product/process development, v.c Education comprehension: verbalized understanding, returned demonstration, v.c  HOME EXERCISE PROGRAM: PWR! hands, beginning coordination-08/22/23  GOALS: Goals reviewed with patient? Yes  SHORT TERM GOALS: Target date: 09/14/23   I with PD specific HEP  Goal status: ongoing- needs reinforcement 08/22/23  2.  Pt will demonstrate understanding of adapted strategies/ AE to increase safety and I with ADLs/IADLs. Goal status:  ongoing initated 08/24/23  3.  Pt will demonstrate improved bilateral UE use as evidenced by increasing box and blocks score by 3 blocks bilaterally.   Goal status:  ongoing 08/24/23  4.  Pt will demonstrate improved bilateral UE functional use and ease with dressing as evidenced by decreasing 3 button/ unbutton test time by 5 secs.  Goal status: ongoing 08/24/23  5.  Pt will demonstrate ability to retrieve a lightweight object at 105 shoulder flexion and -30 elbow extension with RUE.  Goal status:  ongoing 08/24/23  6.  Pt will demonstrate ability to retrieve a lightweight object at 110 shoulder flexion and -20 elbow extension with LUE  Goal status:  ongoing 08/24/23  LONG TERM GOALS: Target date: 11/08/23   Pt will demonstrate improved fine motor coordination for ADLs as  evidenced by decreasing 9 hole peg test score for RUE by 4 secs  Goal status: INITIAL  2.  Pt will demonstrate improved fine motor coordination for ADLs as evidenced by decreasing 9 hole peg test score for LUE by 4 secs  Goal status: INITIAL   3.. Check PPT#4 and set goal prn          Goal status; inital   4.  Pt will verbalize understanding of ways to prevent future PD related complications  Goal status: INITIAL  5.  Pt will write a short paragraph with 100%  legibility and minimal decrease in letter size  Goal status: INITIAL  6. Pt will perform PPT#2 in 15 secs or less Baseline: 17.02 Goal status: initial  ASSESSMENT:  CLINICAL IMPRESSION: Patient is progressing towards goals He demonstrates improved perfromance donning /doffing shoes with big movements    PERFORMANCE DEFICITS: in functional skills including ADLs, IADLs, coordination, dexterity, tone, ROM, strength, flexibility, Fine motor control, Gross motor control, mobility, balance, endurance, decreased knowledge of precautions, decreased knowledge of use of DME, vision, and UE functional use, cognitive skills including attention, memory, problem solving, safety awareness, sequencing, and thought, and psychosocial skills including coping strategies, environmental adaptation, habits, interpersonal interactions, and routines and behaviors.   IMPAIRMENTS: are limiting patient from ADLs, IADLs, rest and sleep, play, leisure, and social participation.   COMORBIDITIES:  may have co-morbidities  that affects occupational performance. Patient will benefit from skilled OT to address above impairments and improve overall function.  MODIFICATION OR ASSISTANCE TO COMPLETE EVALUATION: Min-Moderate modification of tasks or assist with assess necessary to complete an evaluation.  OT OCCUPATIONAL PROFILE AND HISTORY: Detailed assessment: Review of records and additional review of physical, cognitive, psychosocial history related to  current functional performance.  CLINICAL DECISION MAKING: Moderate - several treatment options, min-mod task modification necessary  REHAB POTENTIAL: Good  EVALUATION COMPLEXITY: Low    PLAN:  OT FREQUENCY: 2x/week  OT DURATION: 12 weeks  PLANNED INTERVENTIONS: 97168 OT Re-evaluation, 97535 self care/ADL training, 02889 therapeutic exercise, 97530 therapeutic activity, 97112 neuromuscular re-education, 97140 manual therapy, 97116 gait training, 02886 aquatic therapy, 97035 ultrasound, 97018 paraffin, 02989 moist heat, 97010 cryotherapy, 97034 contrast bath, 97129 Cognitive training (first 15 min), 02869 Cognitive training(each additional 15 min), passive range of motion, balance training, functional mobility training, visual/perceptual remediation/compensation, energy conservation, coping strategies training, patient/family education, and DME and/or AE instructions  RECOMMENDED OTHER SERVICES: n/a  CONSULTED AND AGREED WITH PLAN OF CARE: Patient  PLAN FOR NEXT SESSION: assess PPT #4, fine motor coordination   Nanea Jared, OT 08/24/2023, 1:32 PM

## 2023-08-25 ENCOUNTER — Ambulatory Visit

## 2023-08-25 ENCOUNTER — Encounter: Payer: Self-pay | Admitting: Speech Pathology

## 2023-08-25 ENCOUNTER — Ambulatory Visit: Admitting: Speech Pathology

## 2023-08-25 DIAGNOSIS — G20B2 Parkinson's disease with dyskinesia, with fluctuations: Secondary | ICD-10-CM

## 2023-08-25 DIAGNOSIS — R471 Dysarthria and anarthria: Secondary | ICD-10-CM

## 2023-08-25 DIAGNOSIS — R262 Difficulty in walking, not elsewhere classified: Secondary | ICD-10-CM | POA: Diagnosis not present

## 2023-08-25 DIAGNOSIS — R29818 Other symptoms and signs involving the nervous system: Secondary | ICD-10-CM

## 2023-08-25 DIAGNOSIS — R2689 Other abnormalities of gait and mobility: Secondary | ICD-10-CM

## 2023-08-25 DIAGNOSIS — R278 Other lack of coordination: Secondary | ICD-10-CM

## 2023-08-25 NOTE — Therapy (Signed)
 OUTPATIENT PHYSICAL THERAPY NEURO TREATMENT    Patient Name: Patrick Ross MRN: [id number] DOB:[birth year], 78 y.o., male Today's Date: 08/25/2023   PCP: Millicent Sharper MD REFERRING PROVIDER: Evonnie Asberry RAMAN DO  END OF SESSION:  PT End of Session - 08/25/23 1308     Visit Number 11    Date for PT Re-Evaluation 09/28/23    Authorization Type Medicare    PT Start Time 1312    PT Stop Time 1355    PT Time Calculation (min) 43 min                    Past Medical History:  Diagnosis Date   Depression    DVT (deep venous thrombosis) (HCC)    GERD (gastroesophageal reflux disease)    History of kidney stones    passed   Hypertension    Hypothyroidism    Lumbar stenosis    OSA (obstructive sleep apnea)    wears cpap   Parkinson's disease (HCC)    PONV (postoperative nausea and vomiting)    REM behavioral disorder    Urinary incontinence    Past Surgical History:  Procedure Laterality Date   BACK SURGERY     x 2 - not fusion   BIOPSY  06/01/2022   Procedure: BIOPSY;  Surgeon: Dianna Specking, MD;  Location: [facility];  Service: Gastroenterology;;   COLONOSCOPY     DEEP BRAIN STIMULATOR PLACEMENT     left 12/27/2012, right 12/10/2011, left revision 02/02/2013, replacement bilateral 11/21/2015   ESOPHAGOGASTRODUODENOSCOPY (EGD) WITH PROPOFOL  N/A 06/01/2022   Procedure: ESOPHAGOGASTRODUODENOSCOPY (EGD) WITH PROPOFOL ;  Surgeon: Dianna Specking, MD;  Location: [facility];  Service: Gastroenterology;  Laterality: N/A;   MINOR PLACEMENT OF FIDUCIAL N/A 01/09/2019   Procedure: Fiducial placement;  Surgeon: Unice Pac, MD;  Location: [facility] OR;  Service: Neurosurgery;  Laterality: N/A;  Fiducial placement   PARTIAL NEPHRECTOMY Right 2005 ish   PULSE GENERATOR IMPLANT Right 01/23/2019   Procedure: Right chest implantable pulse generator change;  Surgeon: Unice Pac, MD;  Location: [facility] OR;  Service: Neurosurgery;  Laterality: Right;   SPINAL FUSION     lumbar  Fusion x 2   SUBTHALAMIC STIMULATOR BATTERY REPLACEMENT Left 02/02/2018   Procedure: Change implantable pulse generator battery, Left chest;  Surgeon: Unice Pac, MD;  Location: [facility] OR;  Service: Neurosurgery;  Laterality: Left;   SUBTHALAMIC STIMULATOR BATTERY REPLACEMENT N/A 01/22/2020   Procedure: REMOVAL OF LEFT BRAIN DEEP BRAIN STIMULATOR ELECTRODE, EXTENSION, AND PULSE GENERATOR;  Surgeon: Unice Pac, MD;  Location: [facility] OR;  Service: Neurosurgery;  Laterality: N/A;   SUBTHALAMIC STIMULATOR BATTERY REPLACEMENT Right 02/23/2022   Procedure: CHANGE IPG BATTERY, RT CHEST;  Surgeon: Dawley, Lani JAYSON, DO;  Location: [facility] OR;  Service: Neurosurgery;  Laterality: Right;   SUBTHALAMIC STIMULATOR INSERTION Left 01/16/2019   Procedure: Left deep brain stimulator electrode revision.;  Surgeon: Unice Pac, MD;  Location: [facility] OR;  Service: Neurosurgery;  Laterality: Left;   SUBTHALAMIC STIMULATOR INSERTION Left 01/23/2019   Procedure: Repositioning of Left Deep Brain Stimulator Electrode;  Surgeon: Unice Pac, MD;  Location: [facility] OR;  Service: Neurosurgery;  Laterality: Left;   TONSILLECTOMY     Patient Active Problem List   Diagnosis Date Noted   Melena 06/01/2022   Acute cystitis without hematuria 05/31/2022   Upper GI bleed 05/31/2022   Generalized weakness 05/30/2022   Abdominal pain 05/30/2022   OSA (obstructive sleep apnea) 05/30/2022   Hypothyroidism 05/30/2022   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) 12/14/2021   Mixed hyperlipidemia 12/14/2021   Essential hypertension 12/14/2021   Palpitations 12/14/2021   Angina pectoris (HCC) 12/14/2021   Urge incontinence 01/15/2021   History of UTI 01/15/2021   Abnormal urine findings 01/15/2021   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   History of nephrolithiasis 01/15/2021   DVT, lower extremity, proximal, acute, right (HCC) 02/26/2020   Open scalp wound 01/22/2020   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HCC)    RBD (REM behavioral disorder)  05/08/2018   Parkinson's disease (HCC) 01/16/2018   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) 01/16/2018   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    ONSET DATE: exacerbation 06/21/23, Parkinson's diagnosis 2010  REFERRING DIAG: parkinsons disease with fluctuating dyskinesia  THERAPY DIAG:  Parkinson's disease with dyskinesia, with fluctuations (HCC)  Other abnormalities of gait and mobility  Other lack of coordination  Other symptoms and signs involving the nervous system  Difficulty in walking, not elsewhere classified  Rationale for Evaluation and Treatment: Rehabilitation  SUBJECTIVE:                                                                                                                                                                                             SUBJECTIVE STATEMENT: A lot the pain is gone out of the leg now that the medicine kicked in.    EVAL-Pt and his wife report long history of parkinson's,  biggest difficulty with his mobility that they note is R lower back  pain and labored supine to sit movement, also poor foot clearance.  He doesn't like relying on the walker Pt accompanied by: significant other  PERTINENT HISTORY: parkinson's diagnosed greater than 10 yrs ago, deep brain stimulator R, had in L as well but had to be removed for various reasons.  walks around neighborhood 3 x week with aide Tuesdays and Thursdays parkisons bicycle class 2 x Drue training for upper body work out Referred to PT and ST by neurologist   PAIN:  Are you having pain? R lower back pain constant  PRECAUTIONS: Fall and Other: deep brain stimulator on R   RED FLAGS: None   WEIGHT BEARING RESTRICTIONS: No  FALLS: Has patient fallen in last 6 months? No  LIVING ENVIRONMENT: Lives with: lives with their spouse Lives in: House/apartment Stairs: has internal steps, but has had home renovated and adapted so the he and his wife live in downstairs apartment, all one level  with bathroom and kitchen Has following equipment at home: Single point cane, Environmental consultant - 4 wheeled, Shower bench, Grab bars, and Ramped entry  PLOF: Independent  PATIENT GOALS: improve bed mobility and improve gait, stability, avoid falls  OBJECTIVE:  Note: Objective measures were completed at Evaluation unless otherwise noted.  DIAGNOSTIC FINDINGS: see neuro notes, has deep brain stimulator R   COGNITION: Overall cognitive status: Within functional limits for tasks assessed   SENSATION: WFL  COORDINATION: Slowed movements B and noted curling R hand and R foot at times, with gait festinating on turns   EDEMA:  None noted  MUSCLE TONE: noted curling R fingers, toes, knee flex and hip flex at times, randomly during evaluation  MUSCLE LENGTH: Hamstrings: Right -30 deg; Left -30 deg Thomas test: Right nt deg; Left nt deg    POSTURE: Standing with some forward lean, weight on forefeet, L calf atrophied pt and wife report chronic after L lumbar surgery.     LOWER EXTREMITY ROM:   grossly wfl, hips not assessed as not positioned on mat today   LOWER EXTREMITY MMT:    MMT Right Eval Left Eval  Hip flexion 4 4-  Hip extension    Hip abduction    Hip adduction    Hip internal rotation    Hip external rotation    Knee flexion 4 4  Knee extension 4 4-  Ankle dorsiflexion 4 3+  Ankle plantarflexion 4- 3-  Ankle inversion    Ankle eversion    (Blank rows = not tested)  BED MOBILITY:  Not tested  TRANSFERS: I but slowed, uses hands on seat or with rollator  GAIT: Findings: Gait Characteristics: step to pattern, decreased step length- Right, decreased step length- Left, decreased stride length, decreased hip/knee flexion- Right, decreased hip/knee flexion- Left, shuffling, festinating, trunk flexed, and narrow BOS, Distance walked: 100', Assistive device utilized:Walker - 4 wheeled, Level of assistance: SBA, and Comments: festinating noted on turns, tends to turn  L  FUNCTIONAL TESTS:  30 seconds chair stand test 6 reps Timed up and go (TUG): 33.39 with rollator Standing static feet together 15 sec Standing semi tandem 10 sec each leg, needed CGA with L foot forward Standing marching, required B hand hold support to maintain balance                                                                                                                              TREATMENT DATE: 08/25/23 NuStep L5x23mins  Leg ext 10# 2x10 HS curls 25# 2x10 Fitter pushes 1 blue and 1 black band 2x10 STS 2x10 Arm swings holding 1 and 2# WaTe 20s x2    08/23/23 Recheck goals - 5xSTS - TUG - MMT NuStep L5x68mins  Modified sit ups holding yellow 2x10 Box tap 6 Step ups 6 Balance on airex feet together, eyes open 220s and then closed 10   08/18/23 Leg ext 10# 2x10 HS curls 25# 2x10 OHP press yellow ball 2x10 Supine LE stretching- HS, LTR, SKTC  NuStep L5 x22mins    08/16/23 Holding big red ball tapping floor to overhead to side to side x5 Standing shoulder rows and ext green 2x10 Standing shoulder  flexion with 2# WaTE 2x10 Box taps 4 holding on and then without walker CGA-minA  NuStep L5x22mins STS 2x10   08/11/23 NuStep L5x79mins  Fitter pushes 2 blue bands 2x10  Side steps over obstacles in bars  On airex reaching for numbers  Isometric ab push downs 2x10   08/09/23 NuStep L5x80mins  STS with chest press red ball 2x8 Standing march 3#  3# hip abd 2x10  Horizontal abd green 2x10 Leg ext 10# 2x10 HS curls 25# 2x10    08/04/23 30s chair test TUG 27s  MMT 4/5 Standing shoulder ext and rows green band 2x10 Standing shoulder flexion 2# WaTE x10 - stopped due to feeling dizzy and off balance  NuStep L5 x34mins  Seated rotation with yellow ball 2x10   08/02/23 NuStep L5x13mins  Side steps over obstacles in bars Forwards steps over obstacles in bars Calf raises x10 Box taps 4 1HRA Step ups 4 2HRA  STS with OHP 2x5  07/28/23 Supine HS stretch  and SKTC  Lower trunk rotations x10 SLR 2x10 Feet on pball rotations and knees to chest AB isometric holds x10  NuStep L5x47mins  STS 3x5   07/27/23 NuStep L5x29mins  LAQ 2# 2x10 HS curls green 2x10  Hip abduction green 2x10 STS 2x10  Stretching with pball rotations and flexion x5     07/06/23 Evaluation, advised to attempt prone lying on bed 10 to 15 min  to stretch body into extension( pt doesn't ever lie prone)   PATIENT EDUCATION: Education details: POC, goals Person educated: Patient and Spouse Education method: Explanation, Demonstration, Tactile cues, and Verbal cues Education comprehension: verbalized understanding, verbal cues required, and needs further education  HOME EXERCISE PROGRAM: Access Code: [email or web address] URL: [email or web address] Date: 08/02/2023 Prepared by: Almetta Fam  Exercises - Supine Hamstring Stretch with Strap  - 2 x daily - 7 x weekly - 2 reps - 15 hold - Modified Thomas Stretch  - 2 x daily - 7 x weekly - 2 reps - 15 hold - Supine Bridge  - 1 x daily - 7 x weekly - 2 sets - 10 reps - Standing Shoulder Horizontal Abduction with Resistance  - 1 x daily - 7 x weekly - 2 sets - 10 reps  GOALS: Goals reviewed with patient? Yes  SHORT TERM GOALS: Target date: 4 weeks, 08/03/23  I home program for posture and for hamstring, hip flexor stretchig Baseline: Goal status: ongoing 08/02/23, ongoing 08/23/23  LONG TERM GOALS: Target date: 09/28/23: 12 weeks   30 sec sit to stand improve from 6 reps to 12 reps for improved coordination, strength, endurance LEs Baseline:  Goal status: 7 reps ongoing 08/04/23, 8 reps 08/23/23  2.  I and pain free supine to sit motion , rolling to R Baseline: very painful per pt and wife history, not specifically assessed in clinic today Goal status: ongoing 07/28/23, ongoing 08/09/23, still has pain with bed mobility 08/23/23  3.  TUG score improve from 33 sec to 18 or less for reduced fall risk   Baseline:  Goal  status: 27s ongoing 08/04/23, 22s 08/23/23  4.  Improve strength LE's for B quads, hamstrings to 4+5 for improved functional strength Baseline: 4- Goal status: 4/5 08/04/23, 5/5 MET 08/23/23   ASSESSMENT:  CLINICAL IMPRESSION: Patient entered with more shuffling today per speech therapist, but once his medicine kicked in he was taking bigger steps and reports less pain at the start of PT. Has met strength goal, still working on functional tests. Continued  to work on big movements and strengthening. Does well with STS today, only a couple of times where he needs to use legs against table. Restroom break needed in middle of session. Some LOB while doing alternating arm swings. Will continue to progress to improve his overall function.    EVAL Patient is a 78 y.o. male who was evaluated today by physical therapy due to referral from his neurologist, with decline in function, particularly control of R extremities, with his Parkinson's diagnosis.  He and his wife have been proactive about maintaining his function, he attends a training/ strengthening session locally 2 x week , attends parkinson's cycling weekly, and walks around 0.6 miles with his aide 3 x week outdoors with his rollator.  They recently have adapted their home with a main floor renovation so that their bedroom, bath, kitchen are all on one level.   Main problems identified today are weakness B LE's, L ankle is weak chronically due to lumbar radiculopathy and B knee, hips musculature is weak.  He also has coordination deficits with festinating gait particularly on turns and static / dynamic balance deficits.  Has chronic R sided lower back pain which is particularly intense with moving supine to sit.  He should benefit from physical therapy intervention to address his deficits and update his current routine for stretching and strengthening at home.    OBJECTIVE IMPAIRMENTS: Abnormal gait, decreased activity tolerance, decreased balance,  decreased coordination, decreased endurance, decreased mobility, difficulty walking, decreased strength, hypomobility, impaired perceived functional ability, impaired UE functional use, postural dysfunction, and pain.   ACTIVITY LIMITATIONS: carrying, lifting, squatting, stairs, transfers, and locomotion level  PARTICIPATION LIMITATIONS: meal prep, cleaning, laundry, shopping, community activity, and church  PERSONAL FACTORS: Age, Behavior pattern, Fitness, Past/current experiences, Time since onset of injury/illness/exacerbation, and 1-2 comorbidities: h/o L lumbar radiculopathy and weakness L LE, chronic R sided lower back pain, are also affecting patient's functional outcome.   REHAB POTENTIAL: Good  CLINICAL DECISION MAKING: Evolving/moderate complexity  EVALUATION COMPLEXITY: Moderate  PLAN:  PT FREQUENCY: 2x/week  PT DURATION: 12 weeks  PLANNED INTERVENTIONS: 97110-Therapeutic exercises, 97530- Therapeutic activity, [id number]- Neuromuscular re-education, 97535- Self Care, and [id number]- Manual therapy  PLAN FOR NEXT SESSION: recheck goals for 10th visit PN   Almetta Fam, PT, DPT 08/25/2023, 1:54 PM

## 2023-08-25 NOTE — Therapy (Signed)
 OUTPATIENT SPEECH LANGUAGE PATHOLOGY TREATMENT   Patient Name: Patrick Ross MRN: [id number] DOB:Aug 31, 1945, 78 y.o., male Today's Date: 08/25/2023  PCP: Millicent Sharper, MD REFERRING PROVIDER: Evonnie Asberry RAMAN, DO   END OF SESSION:  End of Session - 08/25/23 1240     Visit Number 13    Number of Visits 17    Date for SLP Re-Evaluation 09/06/23    SLP Start Time 1230    SLP Stop Time  1310    SLP Time Calculation (min) 40 min    Activity Tolerance Patient tolerated treatment well          Past Medical History:  Diagnosis Date   Depression    DVT (deep venous thrombosis) (HCC)    GERD (gastroesophageal reflux disease)    History of kidney stones    passed   Hypertension    Hypothyroidism    Lumbar stenosis    OSA (obstructive sleep apnea)    wears cpap   Parkinson's disease (HCC)    PONV (postoperative nausea and vomiting)    REM behavioral disorder    Urinary incontinence    Past Surgical History:  Procedure Laterality Date   BACK SURGERY     x 2 - not fusion   BIOPSY  06/01/2022   Procedure: BIOPSY;  Surgeon: Dianna Specking, MD;  Location: [facility];  Service: Gastroenterology;;   COLONOSCOPY     DEEP BRAIN STIMULATOR PLACEMENT     left 12/27/2012, right 12/10/2011, left revision 02/02/2013, replacement bilateral 11/21/2015   ESOPHAGOGASTRODUODENOSCOPY (EGD) WITH PROPOFOL  N/A 06/01/2022   Procedure: ESOPHAGOGASTRODUODENOSCOPY (EGD) WITH PROPOFOL ;  Surgeon: Dianna Specking, MD;  Location: [facility];  Service: Gastroenterology;  Laterality: N/A;   MINOR PLACEMENT OF FIDUCIAL N/A 01/09/2019   Procedure: Fiducial placement;  Surgeon: Unice Pac, MD;  Location: [facility] OR;  Service: Neurosurgery;  Laterality: N/A;  Fiducial placement   PARTIAL NEPHRECTOMY Right 2005 ish   PULSE GENERATOR IMPLANT Right 01/23/2019   Procedure: Right chest implantable pulse generator change;  Surgeon: Unice Pac, MD;  Location: [facility] OR;  Service: Neurosurgery;  Laterality:  Right;   SPINAL FUSION     lumbar Fusion x 2   SUBTHALAMIC STIMULATOR BATTERY REPLACEMENT Left 02/02/2018   Procedure: Change implantable pulse generator battery, Left chest;  Surgeon: Unice Pac, MD;  Location: [facility] OR;  Service: Neurosurgery;  Laterality: Left;   SUBTHALAMIC STIMULATOR BATTERY REPLACEMENT N/A 01/22/2020   Procedure: REMOVAL OF LEFT BRAIN DEEP BRAIN STIMULATOR ELECTRODE, EXTENSION, AND PULSE GENERATOR;  Surgeon: Unice Pac, MD;  Location: [facility] OR;  Service: Neurosurgery;  Laterality: N/A;   SUBTHALAMIC STIMULATOR BATTERY REPLACEMENT Right 02/23/2022   Procedure: CHANGE IPG BATTERY, RT CHEST;  Surgeon: Dawley, Lani JAYSON, DO;  Location: [facility] OR;  Service: Neurosurgery;  Laterality: Right;   SUBTHALAMIC STIMULATOR INSERTION Left 01/16/2019   Procedure: Left deep brain stimulator electrode revision.;  Surgeon: Unice Pac, MD;  Location: [facility] OR;  Service: Neurosurgery;  Laterality: Left;   SUBTHALAMIC STIMULATOR INSERTION Left 01/23/2019   Procedure: Repositioning of Left Deep Brain Stimulator Electrode;  Surgeon: Unice Pac, MD;  Location: 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) 12/14/2021   Mixed hyperlipidemia 12/14/2021   Essential hypertension 12/14/2021   Palpitations 12/14/2021   Angina pectoris (HCC) 12/14/2021   Urge incontinence 01/15/2021   History of UTI 01/15/2021   Abnormal urine findings 01/15/2021   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   History of nephrolithiasis 01/15/2021   DVT, lower extremity, proximal, acute, right (HCC) 02/26/2020   Open scalp wound 01/22/2020   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HCC)     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   Parkinson's disease (HCC) 01/16/2018   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  Pt accompanied by: significant other; [name]  PERTINENT HISTORY: PD (hx of STN DBS), depression, sleep apnea  PAIN:  Are you having pain? Yes: NPRS scale: 4 Pain location: right leg pain Pain description: chronic Aggravating factors: walking Relieving factors: pain meds, but did not take yet  FALLS: Has patient fallen in last 6 months?  Yes  LIVING ENVIRONMENT: Lives with: lives with their family; 1 Lives in: House/apartment  PLOF:  Level of assistance: Independent with ADLs, Independent with IADLs Employment: Retired  PATIENT GOALS: voice, cognition  OBJECTIVE:  Note: Objective measures were completed at Evaluation unless otherwise noted.  DIAGNOSTIC FINDINGS: No imaging on this EMR since 2021 CT Head.   COGNITION: Overall cognitive status: Impaired Areas of impairment:  Attention: Impaired: Selective, Alternating, Divided Memory: Impaired: Short term Prospective Executive function: Impaired: Organization and Planning Functional deficits: Difficulty with recall of details  MOTOR SPEECH: Overall motor speech: impaired Level of impairment: Conversation Respiration: Reduced support Phonation: normal Resonance: WFL Articulation: Impaired: conversation Intelligibility: Intelligibility reduced Motor planning: Appears intact Motor speech errors: NA Interfering components: NA Effective technique: slow rate, increased vocal intensity, and over articulate  ORAL MOTOR  EXAMINATION: Overall status: WFL Comments:    CLINICAL SWALLOW ASSESSMENT:   Current diet: regular and thin liquids Dentition: adequate natural dentition Patient directly observed with POs: Yes: thin liquids  Feeding: able to feed self Liquids provided by: cup and straw Oral phase signs and symptoms: NA Pharyngeal phase signs and symptoms: multiple swallows Comments: [facility] Swallow Protocol PASS   *Pt reports some difficulty with drier foods and occasionally getting strangled with liquids. No observed this date. To monitor.   SOCIAL HISTORY: Occupation: Warden/ranger intake: suboptimal Caffeine/alcohol intake: minimal Daily voice use: minimal  PERCEPTUAL VOICE ASSESSMENT: Voice quality: normal Vocal abuse: NA Resonance: normal Respiratory function: thoracic breathing  LSVT-LOUD VOICE EVALUATION: Maximum phonation time for sustained "ah": 13 sec Mean intensity during sustained "ah": 80 dB  Mean fundamental frequency during sustained "ah": 111 Hz  Mean intensity sustained during conversational speech: 70 dB Habitual pitch: 110 Hz Highest dynamic pitch when altering pitch from a low note to a high note: 175 Hz Lowest dynamic pitch when altering from a high note to a low note: 106 Hz Highest dynamic pitch during conversational speech: 159 Hz Lowest dynamic pitch during conversational speech: 82 Hz Average time patient was able to sustain /s/: 4.8 seconds Average time patient was able to sustain /z/: 13.8 seconds s/z ratio : .34 (indicating suspected respiratory inefficiency)   Speech is characterized by Other: . For trained listener in quiet environment with context, intelligibility was >90%.  Patient able to improve all parameters with model ("Loud like me") Stimulability: Improved vocal quality with loud voice (81 dB) for sustained vowel, functional phrases averaged 78 dB and improved pitch range with model.     The Communication Effectiveness Survey is a patient-reported  outcome measure in which the patient rates their own effectiveness in different communication situations. A higher score indicates greater effectiveness. Pt's self-rating was 27/32. Patient reported mild difficulty conversing with a telephone, conversing in a noisy environment, speaking when emotional, conversing when traveling in a car, having conversation with someone at a distance.   STANDARDIZED ASSESSMENTS: SLUMS: 24/30  PATIENT REPORTED OUTCOME MEASURES (PROM): Cognitive function: Short Form: 34 and Communication Effectiveness Survey: 27                                                                                                                            TREATMENT DATE:   08/25/23: *Pt reports feeling slowed down and tired. SLP suggested scheduling less tasks during the day to help with fatigue (I.e. moving cycling down to x1/week, or stopping until he completes therapies).   Sustained phonation /a/: 80 dB  Counting Exercise (goal = 80-85 dB): 80 dB Reading Exercise 1 (goal= 75-85 dB): 78 dB  Reading Exercise 2: 79 dB  Cognitive Exercise (goal 72-78 dB): 76 dB   Pt continues to improve vocal intensity through SPEAK OUT. He completed lesson 20 today.   08/23/23: Pt was seen for skilled ST services targeting dysarthria and respiratory muscle strength training. Pt reports he had difficulty this morning where the word got stuck when he was asked a question. He reported he felt like his mouth was dry. Pt had previously reported that he does not talk much in the mornings before his classes. SLP suggested trying his SPEAK OUT lesson prior to his class to see if this helps warm him up. Pt reports it taking increased effort to complete EMST after increasing cmH20. Pt required minA to blow for longer duration into his device, as his blow out has gotten shorter with increased intensity.   Complete 5 sets of 5 on EMST. Pt is continuing voice exercises. Pt with questions regarding journaling  template. SLP educated on how to produce bullet points. Pt has been consistently journaling since his last visit. To cont.   08/18/23: Pt was seen for skilled ST services targeting dysarthria and respiratory muscle strength training. SLP increased cmH20 by clockwise 1/4 turn. We completed 5 sets of 5 at the new level (37 cmH20). Pt rated effort level 7 out of 10 In the zone. Pt continues to use his paper to check off when he completes his daily exercises. SLP facilitated memory by encouraging pt to use speech to text to complete a journal entry. Pt was able to do with minA. Wife reportsimprovement in communication at home and that he has been more aware of when he begins to trail off in the conversation.    08/16/23: Pt was  seen for skilled ST services targeting dysarthria. Pt was able to recall how his voice should be sounding when doing his exercises. Pt completed exercises this date - pt reported he benefited from therapist hand moving as he glided up and down the scale on /a/. Pt required modA verbal cues to complete exercises - specifically the ones requiring cognition. To continue next session.   08/11/23: Pt was seen for skilled ST services targeting dysarthria. Pt completed 5 sets of 5 on EMST, as he had not completed his exercises yet today. He required minA verbal cues to complete correctly. SLP demonstrated how to use speech-to-text and notes function on his iPad. Pt reported he would like to write a routine to follow in the morning. We completed morning routines for re: mon/wed/fri and tu/th. SLP also demonstrated how notes can be used to write a daily journal as pt reports he has difficulty recalling things from day to day. To cont next session/  08/09/23: Pt was seen for skilled ST services targeting dysarthria. Pt completed 5 sets of 5 on EMST today. He required min verbal cueing to complete task. Pt has been keeping up with EMST and SPEAK OUT exercises using his template for keeping track of  reps/dates. SLP initiated memory strategy education this session. Pt reports he could benefit from several external memory strategies. To bring in phone next session for SLP demonstration of speech-to-text.   08/05/23: Pt was seen for skilled ST services targeting dysarthria. Pt completed 5 sets of 5 on EMST today. He required min-mod verbal cueing to complete task. Pt required SLP to keep count of breaths and encouragement to pause in between. Pt completed voice exercises with therapist today.   Warm up: 82 dB avg  Reading: 81 dB avg  Cognitive Exercises 79 dB avg   08/02/23: Pt was seen for skilled ST services targeting dysarthria. Pt reports he has completed his EMST today. SLP led pt through warm up exercises re: diaphragmatic breathing; may, me, my, mo, moo; sustained phonation, and pitch glides. SLP instructed pt to project voice (think loud; speak over the fence) during warm up. Pt read instructions for SPEAK OUT and performed exercises with minA. He required occasional verbal cues to continue with intent for entirety of reading exercise vs trailing off. Pt participated in structured conversation with consistent cues for increasing loudness.   07/28/23: Pt was seen for skilled ST services targeting education for wife and design of templates for EMST, SPEAK OUT, and Medication log due to pt having difficulty remembering to complete exercises. SLP encouraged wife to help patient set up a memory and exercise station with calendar, medication log, and exercise log. Pt completed reading and cognitive portion of SPEAK OUT while SLP designed templates. Provided in clear sheet protector at end of session. Pt to bring EMST next session.   07/21/23: Pt was seen for skilled ST services targeting review of SPEAK OUT. Pt reports he did not complete any voice exercises due to company this week. SLP explained the importance of consistency with exercises for demonstrated improvement. He has not completed his  EMST exercises in over a year. SLP reassessed on his personal device. Pt encouraged to set a routine to complete voice and respiratory exercises.    EMST 150 3X5 reps @ 75% of MEP - 30 cmH20 with 15 second rest  (45 cmH20 MEP)   07/12/23: Pt was seen for skilled ST services targeting review of SPEAK OUT. SLP educated on Living with Intent Means To.SABRASABRA  found in his HEP book. Patient reported that speaking with conscious effort resonated the most with him and his idea of intent. SLP led patient through vocal warm up re: May-Me-My-Moe-Mo x5, sustained phonation /ah/ x5, pitch glides x5/each. Pt completed cognitive component of SPEAK OUT book independently (up and ___). Pt demonstrated completion of SPEAK OUT  exercises with spv. SLP set pt up with a system to separate his completion from previous therapy course (using pencil and X marks vs. Pen and Check Mark). Pt completed Lesson 1 with therapist for AM portion - to complete PM portion at home this evening.   07/08/23: Pt was seen for skilled ST services targeting continued voice assessment - see above. Pt also completed PROMs for cognitive function and dysarthria. Pt brought in his Speak Out book from previous therapist. SLP encouraged pt to continue with exercises. SLP is not speak out certified, but plans to use similar cues with exemplars from book for continuity of care.  PATIENT EDUCATION: Education details: PD and SLP role  Person educated: Patient and Spouse Education method: Explanation Education comprehension: verbalized understanding   GOALS: Goals reviewed with patient? Yes  SHORT TERM GOALS: Target date: 08/07/23  Pt will complete baseline voice measures Baseline: Goal status:MET  2.  Pt will complete PROMs Baseline:  Goal status: MET  3.  Pt will demonstrate voice HEP independently Baseline:  Goal status: INITIAL  4.  Pt/wife will verbalize 3 memory strategies to improve recall of important information Baseline:   Goal status: INITIAL  5.   Baseline:  Goal status: INITIAL  6.   Baseline:  Goal status: INITIAL  LONG TERM GOALS: Target date: 09/06/23  Pt will improve score on PROMs Baseline:  Goal status: INITIAL  2.  Pt will demonstrate improved vocal intensity with sustained phonation Baseline:  Goal status: INITIAL  3.  Pt will demonstrate improved vocal intensity in 5 min, unstructured conversation Baseline:  Goal status: INITIAL  4.  Pt/wife will report success using memory strategies at home Baseline:  Goal status: INITIAL  5.   Baseline:  Goal status: INITIAL  6.   Baseline:  Goal status: INITIAL  ASSESSMENT:  CLINICAL IMPRESSION: Pt is a 78 yo male who presents to ST OP for evaluation for sx 2/2 Parkinson's Disease.  Pt endorses changes in vocal intensity, breath support, and cognition re: focus, memory. Pt has received speech therapy before at [facility] ~2 years ago. Wife reports he has been referred for a neuropsych evaluation for cognition; therefore, SLP to just complete a screen at this time. Pt was assessed using SLUMS - scoring a 24/30. Impairments noted in recall and word generation. Pt with complaints of occasional difficulty with thin liquids and solids. Pt passed YSP, with no overt s/sx of aspiration. Pt reports he has some difficulty with drier solids getting stuck. SLP suggested moister meats re: chicken thighs vs chicken breast, cutting items into smaller pieces, and adding sauces/gravies to drier foods. Unable to complete voice measures due to time constraints. To complete next session. SLP rec skilled ST services to address cognitive-communication impairment, dysarthria, and monitor dysphagia.    OBJECTIVE IMPAIRMENTS: include attention, memory, executive functioning, and dysarthria. These impairments are limiting patient from effectively communicating at home and in community. Factors affecting potential to achieve goals and functional outcome are  NA. Patient will benefit from skilled SLP services to address above impairments and improve overall function.  REHAB POTENTIAL: Good  PLAN:  SLP FREQUENCY: 1-2x/week  SLP DURATION:  8 weeks  PLANNED INTERVENTIONS: Diet toleration management , Cueing hierachy, Internal/external aids, Functional tasks, Multimodal communication approach, SLP instruction and feedback, Compensatory strategies, Patient/family education, and [id number] Treatment of speech (30 or 45 min)     [employer], CCC-SLP 08/25/2023, 12:45 PM

## 2023-08-29 ENCOUNTER — Encounter: Payer: Self-pay | Admitting: Occupational Therapy

## 2023-08-29 ENCOUNTER — Ambulatory Visit: Admitting: Occupational Therapy

## 2023-08-29 DIAGNOSIS — R29818 Other symptoms and signs involving the nervous system: Secondary | ICD-10-CM

## 2023-08-29 DIAGNOSIS — R41844 Frontal lobe and executive function deficit: Secondary | ICD-10-CM

## 2023-08-29 DIAGNOSIS — R278 Other lack of coordination: Secondary | ICD-10-CM

## 2023-08-29 DIAGNOSIS — R41840 Attention and concentration deficit: Secondary | ICD-10-CM

## 2023-08-29 DIAGNOSIS — R262 Difficulty in walking, not elsewhere classified: Secondary | ICD-10-CM | POA: Diagnosis not present

## 2023-08-29 DIAGNOSIS — R2689 Other abnormalities of gait and mobility: Secondary | ICD-10-CM

## 2023-08-29 NOTE — Therapy (Signed)
 OUTPATIENT OCCUPATIONAL THERAPY PARKINSON'S treatment  Patient Name: Patrick Ross MRN: [id number] DOB:January 08, 1946, 78 y.o., male Today's Date: 08/29/2023  PCP: Dr. Millicent REFERRING PROVIDER:Dr. Tat  END OF SESSION:  OT End of Session - 08/29/23 1458     Visit Number 4    Number of Visits 25    Date for OT Re-Evaluation 11/08/23    Authorization Time Period 12 weeks, anticipate d/c after 8 weeks    Authorization - Visit Number 4    Progress Note Due on Visit 10    OT Start Time 1446    OT Stop Time 1530    OT Time Calculation (min) 44 min    Activity Tolerance Patient tolerated treatment well    Behavior During Therapy WFL for tasks assessed/performed             Past Medical History:  Diagnosis Date   Depression    DVT (deep venous thrombosis) (HCC)    GERD (gastroesophageal reflux disease)    History of kidney stones    passed   Hypertension    Hypothyroidism    Lumbar stenosis    OSA (obstructive sleep apnea)    wears cpap   Parkinson's disease (HCC)    PONV (postoperative nausea and vomiting)    REM behavioral disorder    Urinary incontinence    Past Surgical History:  Procedure Laterality Date   BACK SURGERY     x 2 - not fusion   BIOPSY  06/01/2022   Procedure: BIOPSY;  Surgeon: Dianna Specking, MD;  Location: [facility];  Service: Gastroenterology;;   COLONOSCOPY     DEEP BRAIN STIMULATOR PLACEMENT     left 12/27/2012, right 12/10/2011, left revision 02/02/2013, replacement bilateral 11/21/2015   ESOPHAGOGASTRODUODENOSCOPY (EGD) WITH PROPOFOL  N/A 06/01/2022   Procedure: ESOPHAGOGASTRODUODENOSCOPY (EGD) WITH PROPOFOL ;  Surgeon: Dianna Specking, MD;  Location: [facility];  Service: Gastroenterology;  Laterality: N/A;   MINOR PLACEMENT OF FIDUCIAL N/A 01/09/2019   Procedure: Fiducial placement;  Surgeon: Unice Pac, MD;  Location: [facility] OR;  Service: Neurosurgery;  Laterality: N/A;  Fiducial placement   PARTIAL NEPHRECTOMY Right 2005 ish   PULSE  GENERATOR IMPLANT Right 01/23/2019   Procedure: Right chest implantable pulse generator change;  Surgeon: Unice Pac, MD;  Location: [facility] OR;  Service: Neurosurgery;  Laterality: Right;   SPINAL FUSION     lumbar Fusion x 2   SUBTHALAMIC STIMULATOR BATTERY REPLACEMENT Left 02/02/2018   Procedure: Change implantable pulse generator battery, Left chest;  Surgeon: Unice Pac, MD;  Location: [facility] OR;  Service: Neurosurgery;  Laterality: Left;   SUBTHALAMIC STIMULATOR BATTERY REPLACEMENT N/A 01/22/2020   Procedure: REMOVAL OF LEFT BRAIN DEEP BRAIN STIMULATOR ELECTRODE, EXTENSION, AND PULSE GENERATOR;  Surgeon: Unice Pac, MD;  Location: [facility] OR;  Service: Neurosurgery;  Laterality: N/A;   SUBTHALAMIC STIMULATOR BATTERY REPLACEMENT Right 02/23/2022   Procedure: CHANGE IPG BATTERY, RT CHEST;  Surgeon: Dawley, Lani JAYSON, DO;  Location: [facility] OR;  Service: Neurosurgery;  Laterality: Right;   SUBTHALAMIC STIMULATOR INSERTION Left 01/16/2019   Procedure: Left deep brain stimulator electrode revision.;  Surgeon: Unice Pac, MD;  Location: [facility] OR;  Service: Neurosurgery;  Laterality: Left;   SUBTHALAMIC STIMULATOR INSERTION Left 01/23/2019   Procedure: Repositioning of Left Deep Brain Stimulator Electrode;  Surgeon: Unice Pac, MD;  Location: [facility] OR;  Service: Neurosurgery;  Laterality: Left;   TONSILLECTOMY     Patient Active Problem List   Diagnosis Date Noted   Melena 06/01/2022   Acute cystitis without  hematuria 05/31/2022   Upper GI bleed 05/31/2022   Generalized weakness 05/30/2022   Abdominal pain 05/30/2022   OSA (obstructive sleep apnea) 05/30/2022   Hypothyroidism 05/30/2022   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) 12/14/2021   Mixed hyperlipidemia 12/14/2021   Essential hypertension 12/14/2021   Palpitations 12/14/2021   Angina pectoris (HCC) 12/14/2021   Urge incontinence 01/15/2021   History of UTI 01/15/2021   Abnormal urine findings 01/15/2021   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   History of nephrolithiasis 01/15/2021   DVT, lower extremity, proximal, acute, right (HCC) 02/26/2020   Open scalp wound 01/22/2020   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HCC)    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   Parkinson's disease (HCC) 01/16/2018   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) 01/16/2018   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    ONSET DATE: 07/28/23- referral date  REFERRING DIAG:  Diagnosis  Z74.1 (ICD-10-CM) - Requires assistance with activities of daily living (ADL)  G20.B2 (ICD-10-CM) - Parkinson's disease with dyskinesia and fluctuating manifestations (HCC)    THERAPY DIAG:  Parkinson's disease with dyskinesia, with fluctuations (HCC)  Other abnormalities of gait and mobility  Other lack of coordination  Other symptoms and signs involving the nervous system  Attention and concentration deficit  Frontal lobe and executive function deficit  Rationale for Evaluation and Treatment: Rehabilitation  SUBJECTIVE:   SUBJECTIVE STATEMENT: Pt denies pain Pt accompanied by: self  PERTINENT HISTORY: parkinson's diagnosed greater than 10 yrs ago, deep brain stimulator R, had in L as well but had to be removed for various reasons. Depression, sleep apnea, hx of back surgery, see hx above walks around neighborhood 3 x week with aide Tuesdays and Thursdays parkisons bicycle class 2 x Drue training for upper body work out   PRECAUTIONS: Fall- PT is addressing  WEIGHT BEARING RESTRICTIONS: No  PAIN:  Are you having pain? Yes: NPRS scale: 2-3/10 Pain location: back, hip Pain description: aching Aggravating factors: malposiitoning Relieving factors: rest,   FALLS: Has patient fallen in last 6 months? No  LIVING ENVIRONMENT: Lives with: lives with their spouse, has CNA 3x weeks Lives in: House/apartment Stairs: Yes: Internal: has stairt lift, and coverted garage appt on ground level Has following equipment at home: U-step  PLOF: Needs  assistance with ADLs and Needs assistance with homemaking  PATIENT GOALS: Pt wants to be able to tie shoes, and more I with dressing  OBJECTIVE:  Note: Objective measures were completed at Evaluation unless otherwise noted.  HAND DOMINANCE: Right  ADLs: Overall ADLs: increased time required  Transfers/ambulation related to ADLs: Eating: difficulty cutting food, needs assist  Grooming: mod I with brushing teeth, combing, CNA assists with shaving UB Dressing: mod I LB Dressing: mod-max A for LB dressing Toileting: mod I with U-step Bathing: supervision-walk in shower, level has built in seat and grab bars Tub Shower transfers: min A Equipment: Grab bars and level walk in shower with built in bench  IADLs:Dependent with home management  Medication management: wife assists Financial management: wife handles Handwriting: Moderate micrographia at sentence level  MOBILITY STATUS: mod I with U-step  POSTURE COMMENTS:  rounded shoulders and forward head   FUNCTIONAL OUTCOME MEASURES: Fastening/unfastening 3 buttons: 1 min 43 secs   COORDINATION: 9 Hole Peg test: Right: 55.08 sec; Left: 49.17 sec Box and Blocks:  Right 26blocks, Left 34blocks  UE ROM:  A/ROM:shoulder flexion RUE-100 LUE105, elbow extension RUE-35 LUE -25, decreased bilateral supination, RUE 95% finger extension, maintains fingers flexed    SENSATION: [facility]  COGNITION: Overall cognitive status: Impaired, slowed processing and short term memory deficits  OBSERVATIONS: Bradykinesia and resting tremor RUE, dystonia RUE                                                                                                                    TREATMENT DATE: 08/29/23- UBE x 6 mins level 1 for conditioning, pt mainatined 40 rpm PWR! up in seated, x 10 reps attempted PWR! rock seated but it was uncomfortable for back so discontiinued. Dynamic step and reach to flip large playing cards at countertop, to left and right  sides, then at an angle behind (like PWR! step in standing) mod v.c for amplitude and foot placement  PWR! hands followed by placing metal pegs 3 sizes into pegboard with RUE and removing with in hand manipulation, min-mod difficulty/ v.c for amplitude and flicks. Increased time required 08/24/23-PWR!modified quadraped rock at counter then standing PWR! rock to targets, min-mod v.c for amplitude, min facilitation Seated eedge of met, Doffing then donning sock and shoes with big movments, mod v.c and min-mod facillitaion. Pt tried sock aide to donn left sock, hoever ift was easier to donn right sock manually without the sock aide. Increased time required  Pt attempted to fasten buttons on tabletop, however max difficulty despite v.c so task was discontinued. PWR! hands, followed by picking up coins to place in coin slot, min difficulty v.c  08/22/23- UBE x 5 mins level 1 for conditioning. PWR! up rock and step in seated, mod v.c and demonstraiton. PWR! rock in standing min v.c 10-20 reps each. Marching in standing at the sink, min v.c for amplitude. See pt education  08/16/23- eval only    PATIENT EDUCATION: Education details: sit to stand with big movements Person educated: Patient, wife  Education method: Explanation demonstration, v.c Education comprehension: verbalized understanding, returned demonstration, v.c  HOME EXERCISE PROGRAM: PWR! hands, beginning coordination-08/22/23  GOALS: Goals reviewed with patient? Yes  SHORT TERM GOALS: Target date: 09/14/23   I with PD specific HEP  Goal status: ongoing- needs reinforcement 08/22/23  2.  Pt will demonstrate understanding of adapted strategies/ AE to increase safety and I with ADLs/IADLs. Goal status:  ongoing initated 08/24/23  3.  Pt will demonstrate improved bilateral UE use as evidenced by increasing box and blocks score by 3 blocks bilaterally.   Goal status:  ongoing 08/24/23  4.  Pt will demonstrate improved bilateral UE  functional use and ease with dressing as evidenced by decreasing 3 button/ unbutton test time by 5 secs.  Goal status: ongoing 08/24/23  5.  Pt will demonstrate ability to retrieve a lightweight object at 105 shoulder flexion and -30 elbow extension with RUE.  Goal status:  ongoing 08/24/23  6.  Pt will demonstrate ability to retrieve a lightweight object at 110 shoulder flexion and -20 elbow extension with LUE  Goal status:  ongoing 08/24/23  LONG TERM GOALS: Target date: 11/08/23   Pt will demonstrate improved fine motor coordination for ADLs as evidenced  by decreasing 9 hole peg test score for RUE by 4 secs  Goal status: INITIAL  2.  Pt will demonstrate improved fine motor coordination for ADLs as evidenced by decreasing 9 hole peg test score for LUE by 4 secs  Goal status: INITIAL   3.. Check PPT#4 and set goal prn          Goal status; inital   4.  Pt will verbalize understanding of ways to prevent future PD related complications  Goal status: INITIAL  5.  Pt will write a short paragraph with 100% legibility and minimal decrease in letter size  Goal status: INITIAL  6. Pt will perform PPT#2 in 15 secs or less Baseline: 17.02 Goal status: initial  ASSESSMENT:  CLINICAL IMPRESSION: Patient is progressing towards goals He can benefit from reinforcement of big movement strategies with ADLs.  PERFORMANCE DEFICITS: in functional skills including ADLs, IADLs, coordination, dexterity, tone, ROM, strength, flexibility, Fine motor control, Gross motor control, mobility, balance, endurance, decreased knowledge of precautions, decreased knowledge of use of DME, vision, and UE functional use, cognitive skills including attention, memory, problem solving, safety awareness, sequencing, and thought, and psychosocial skills including coping strategies, environmental adaptation, habits, interpersonal interactions, and routines and behaviors.   IMPAIRMENTS: are limiting patient from ADLs,  IADLs, rest and sleep, play, leisure, and social participation.   COMORBIDITIES:  may have co-morbidities  that affects occupational performance. Patient will benefit from skilled OT to address above impairments and improve overall function.  MODIFICATION OR ASSISTANCE TO COMPLETE EVALUATION: Min-Moderate modification of tasks or assist with assess necessary to complete an evaluation.  OT OCCUPATIONAL PROFILE AND HISTORY: Detailed assessment: Review of records and additional review of physical, cognitive, psychosocial history related to current functional performance.  CLINICAL DECISION MAKING: Moderate - several treatment options, min-mod task modification necessary  REHAB POTENTIAL: Good  EVALUATION COMPLEXITY: Low    PLAN:  OT FREQUENCY: 2x/week  OT DURATION: 12 weeks  PLANNED INTERVENTIONS: 97168 OT Re-evaluation, 97535 self care/ADL training, 02889 therapeutic exercise, 97530 therapeutic activity, 97112 neuromuscular re-education, 97140 manual therapy, 97116 gait training, 02886 aquatic therapy, 97035 ultrasound, 97018 paraffin, 02989 moist heat, 97010 cryotherapy, 97034 contrast bath, 97129 Cognitive training (first 15 min), 02869 Cognitive training(each additional 15 min), passive range of motion, balance training, functional mobility training, visual/perceptual remediation/compensation, energy conservation, coping strategies training, patient/family education, and DME and/or AE instructions  RECOMMENDED OTHER SERVICES: n/a  CONSULTED AND AGREED WITH PLAN OF CARE: Patient  PLAN FOR NEXT SESSION: assess PPT #4, issue additional tasks for HEP   Islah Eve, OT 08/29/2023, 4:25 PM

## 2023-08-31 ENCOUNTER — Ambulatory Visit: Attending: Family Medicine | Admitting: Occupational Therapy

## 2023-08-31 DIAGNOSIS — R41840 Attention and concentration deficit: Secondary | ICD-10-CM | POA: Insufficient documentation

## 2023-08-31 DIAGNOSIS — R262 Difficulty in walking, not elsewhere classified: Secondary | ICD-10-CM | POA: Insufficient documentation

## 2023-08-31 DIAGNOSIS — G20B2 Parkinson's disease with dyskinesia, with fluctuations: Secondary | ICD-10-CM | POA: Diagnosis present

## 2023-08-31 DIAGNOSIS — R41844 Frontal lobe and executive function deficit: Secondary | ICD-10-CM | POA: Insufficient documentation

## 2023-08-31 DIAGNOSIS — R278 Other lack of coordination: Secondary | ICD-10-CM | POA: Insufficient documentation

## 2023-08-31 DIAGNOSIS — R29818 Other symptoms and signs involving the nervous system: Secondary | ICD-10-CM | POA: Diagnosis present

## 2023-08-31 DIAGNOSIS — R2689 Other abnormalities of gait and mobility: Secondary | ICD-10-CM | POA: Insufficient documentation

## 2023-08-31 NOTE — Therapy (Signed)
 OUTPATIENT OCCUPATIONAL THERAPY [facility] treatment  Patient Name: Patrick Ross MRN: [id number] DOB:10-13-1945, 78 y.o., male Today's Date: 08/31/2023  PCP: Dr. Millicent REFERRING PROVIDER:Dr. Tat  END OF SESSION:  OT End of Session - 08/31/23 1458     Visit Number 5    Number of Visits 25    Date for OT Re-Evaluation 11/08/23    Authorization Time Period 12 weeks, anticipate d/c after 8 weeks    Authorization - Visit Number 5    Progress Note Due on Visit 10    OT Start Time 1451    OT Stop Time 1530    OT Time Calculation (min) 39 min    Activity Tolerance Patient tolerated treatment well    Behavior During Therapy WFL for tasks assessed/performed             Past Medical History:  Diagnosis Date   Depression    DVT (deep venous thrombosis) (HCC)    GERD (gastroesophageal reflux disease)    History of kidney stones    passed   Hypertension    Hypothyroidism    Lumbar stenosis    OSA (obstructive sleep apnea)    wears cpap   Parkinson's disease (HCC)    PONV (postoperative nausea and vomiting)    REM behavioral disorder    Urinary incontinence    Past Surgical History:  Procedure Laterality Date   BACK SURGERY     x 2 - not fusion   BIOPSY  06/01/2022   Procedure: BIOPSY;  Surgeon: Dianna Specking, MD;  Location: [facility];  Service: Gastroenterology;;   COLONOSCOPY     DEEP BRAIN STIMULATOR PLACEMENT     left 12/27/2012, right 12/10/2011, left revision 02/02/2013, replacement bilateral 11/21/2015   ESOPHAGOGASTRODUODENOSCOPY (EGD) WITH PROPOFOL  N/A 06/01/2022   Procedure: ESOPHAGOGASTRODUODENOSCOPY (EGD) WITH PROPOFOL ;  Surgeon: Dianna Specking, MD;  Location: [facility];  Service: Gastroenterology;  Laterality: N/A;   MINOR PLACEMENT OF FIDUCIAL N/A 01/09/2019   Procedure: Fiducial placement;  Surgeon: Unice Pac, MD;  Location: [facility];  Service: Neurosurgery;  Laterality: N/A;  Fiducial placement   PARTIAL NEPHRECTOMY Right 2005 ish   PULSE  GENERATOR IMPLANT Right 01/23/2019   Procedure: Right chest implantable pulse generator change;  Surgeon: Unice Pac, MD;  Location: [facility] OR;  Service: Neurosurgery;  Laterality: Right;   SPINAL FUSION     lumbar Fusion x 2   SUBTHALAMIC STIMULATOR BATTERY REPLACEMENT Left 02/02/2018   Procedure: Change implantable pulse generator battery, Left chest;  Surgeon: Unice Pac, MD;  Location: [facility] OR;  Service: Neurosurgery;  Laterality: Left;   SUBTHALAMIC STIMULATOR BATTERY REPLACEMENT N/A 01/22/2020   Procedure: REMOVAL OF LEFT BRAIN DEEP BRAIN STIMULATOR ELECTRODE, EXTENSION, AND PULSE GENERATOR;  Surgeon: Unice Pac, MD;  Location: [facility] OR;  Service: Neurosurgery;  Laterality: N/A;   SUBTHALAMIC STIMULATOR BATTERY REPLACEMENT Right 02/23/2022   Procedure: CHANGE IPG BATTERY, RT CHEST;  Surgeon: Dawley, Lani JAYSON, DO;  Location: [facility] OR;  Service: Neurosurgery;  Laterality: Right;   SUBTHALAMIC STIMULATOR INSERTION Left 01/16/2019   Procedure: Left deep brain stimulator electrode revision.;  Surgeon: Unice Pac, MD;  Location: [facility] OR;  Service: Neurosurgery;  Laterality: Left;   SUBTHALAMIC STIMULATOR INSERTION Left 01/23/2019   Procedure: Repositioning of Left Deep Brain Stimulator Electrode;  Surgeon: Unice Pac, MD;  Location: [facility] OR;  Service: Neurosurgery;  Laterality: Left;   TONSILLECTOMY     Patient Active Problem List   Diagnosis Date Noted   Melena 06/01/2022   Acute cystitis without  hematuria 05/31/2022   Upper GI bleed 05/31/2022   Generalized weakness 05/30/2022   Abdominal pain 05/30/2022   OSA (obstructive sleep apnea) 05/30/2022   Hypothyroidism 05/30/2022   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) 12/14/2021   Mixed hyperlipidemia 12/14/2021   Essential hypertension 12/14/2021   Palpitations 12/14/2021   Angina pectoris (HCC) 12/14/2021   Urge incontinence 01/15/2021   History of UTI 01/15/2021   Abnormal urine findings 01/15/2021   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   History of nephrolithiasis 01/15/2021   DVT, lower extremity, proximal, acute, right (HCC) 02/26/2020   Open scalp wound 01/22/2020   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HCC)    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   Parkinson's disease (HCC) 01/16/2018   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) 01/16/2018   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    ONSET DATE: 07/28/23- referral date  REFERRING DIAG:  Diagnosis  Z74.1 (ICD-10-CM) - Requires assistance with activities of daily living (ADL)  G20.B2 (ICD-10-CM) - Parkinson's disease with dyskinesia and fluctuating manifestations (HCC)    THERAPY DIAG:  Other abnormalities of gait and mobility  Other lack of coordination  Other symptoms and signs involving the nervous system  Attention and concentration deficit  Frontal lobe and executive function deficit  Rationale for Evaluation and Treatment: Rehabilitation  SUBJECTIVE:   SUBJECTIVE STATEMENT: Pt reports generalized soreness Pt accompanied by: self  PERTINENT HISTORY: parkinson's diagnosed greater than 10 yrs ago, deep brain stimulator R, had in L as well but had to be removed for various reasons. Depression, sleep apnea, hx of back surgery, see hx above walks around neighborhood 3 x week with aide Tuesdays and Thursdays parkisons bicycle class 2 x Drue training for upper body work out   PRECAUTIONS: Fall- PT is addressing  WEIGHT BEARING RESTRICTIONS: No  PAIN:  Are you having pain? Yes: NPRS scale: 3/10, generalized Pain location: back, hip Pain description: aching Aggravating factors: malposiitoning Relieving factors: rest,   FALLS: Has patient fallen in last 6 months? No  LIVING ENVIRONMENT: Lives with: lives with their spouse, has CNA 3x weeks Lives in: House/apartment Stairs: Yes: Internal: has stairt lift, and coverted garage appt on ground level Has following equipment at home: U-step  PLOF: Needs assistance with ADLs and Needs  assistance with homemaking  PATIENT GOALS: Pt wants to be able to tie shoes, and more I with dressing  OBJECTIVE:  Note: Objective measures were completed at Evaluation unless otherwise noted.  HAND DOMINANCE: Right  ADLs: Overall ADLs: increased time required  Transfers/ambulation related to ADLs: Eating: difficulty cutting food, needs assist  Grooming: mod I with brushing teeth, combing, CNA assists with shaving UB Dressing: mod I LB Dressing: mod-max A for LB dressing Toileting: mod I with U-step Bathing: supervision-walk in shower, level has built in seat and grab bars Tub Shower transfers: min A Equipment: Grab bars and level walk in shower with built in bench  IADLs:Dependent with home management  Medication management: wife assists Financial management: wife handles Handwriting: Moderate micrographia at sentence level  MOBILITY STATUS: mod I with U-step  POSTURE COMMENTS:  rounded shoulders and forward head   FUNCTIONAL OUTCOME MEASURES: Fastening/unfastening 3 buttons: 1 min 43 secs   COORDINATION: 9 Hole Peg test: Right: 55.08 sec; Left: 49.17 sec Box and Blocks:  Right 26blocks, Left 34blocks  UE ROM:  A/ROM:shoulder flexion RUE-100 LUE105, elbow extension RUE-35 LUE -25, decreased bilateral supination, RUE 95% finger extension, maintains fingers flexed    SENSATION: WFL    COGNITION: Overall cognitive status:  Impaired, slowed processing and short term memory deficits  OBSERVATIONS: Bradykinesia and resting tremor RUE, dystonia RUE                                                                                                                    TREATMENT DATE:08/31/23 PWr! hands basic 4 seated at table followed by flipping cards with big movments, mod v.c and demonstration. Standing at sink, modified quadraped PWR! rock, then standing PWR! rock x 10 reps each, Marching in place with big movements, min-mod v.c for amplitude, then stepping back with  alternate feet holding a chair back, then out to the side x 10 reps, min v.c and supervision. Dynamic step and reach to retrieve replace items in cabinets with left and right UE's mod v.c fo amplitude Wiping cabinets for wall slides to promote shoulder flexion. UBE x 5 mins level 1 for conditioning.   08/29/23- UBE x 6 mins level 1 for conditioning, pt mainatined 40 rpm PWR! up in seated, x 10 reps attempted PWR! rock seated but it was uncomfortable for back so discontiinued. Dynamic step and reach to flip large playing cards at countertop, to left and right sides, then at an angle behind (like PWR! step in standing) mod v.c for amplitude and foot placement  PWR! hands followed by placing metal pegs 3 sizes into pegboard with RUE and removing with in hand manipulation, min-mod difficulty/ v.c for amplitude and flicks. Increased time required 08/24/23-PWR!modified quadraped rock at counter then standing PWR! rock to targets, min-mod v.c for amplitude, min facilitation Seated eedge of met, Doffing then donning sock and shoes with big movments, mod v.c and min-mod facillitaion. Pt tried sock aide to donn left sock, hoever ift was easier to donn right sock manually without the sock aide. Increased time required  Pt attempted to fasten buttons on tabletop, however max difficulty despite v.c so task was discontinued. PWR! hands, followed by picking up coins to place in coin slot, min difficulty v.c  08/22/23- UBE x 5 mins level 1 for conditioning. PWR! up rock and step in seated, mod v.c and demonstraiton. PWR! rock in standing min v.c 10-20 reps each. Marching in standing at the sink, min v.c for amplitude. See pt education  08/16/23- eval only    PATIENT EDUCATION: Education details:  see above Person educated: Patient,  Education method: Explanation demonstration, v.c Education comprehension: verbalized understanding, returned demonstration, v.c  HOME EXERCISE PROGRAM: PWR! hands, beginning  coordination-08/22/23  GOALS: Goals reviewed with patient? Yes  SHORT TERM GOALS: Target date: 09/14/23   I with PD specific HEP  Goal status: ongoing- needs reinforcement 08/22/23  2.  Pt will demonstrate understanding of adapted strategies/ AE to increase safety and I with ADLs/IADLs. Goal status:  ongoing initated 08/24/23  3.  Pt will demonstrate improved bilateral UE use as evidenced by increasing box and blocks score by 3 blocks bilaterally.   Goal status:  ongoing 08/24/23  4.  Pt will demonstrate improved bilateral UE functional use and ease  with dressing as evidenced by decreasing 3 button/ unbutton test time by 5 secs.  Goal status: ongoing 08/24/23  5.  Pt will demonstrate ability to retrieve a lightweight object at 105 shoulder flexion and -30 elbow extension with RUE.  Goal status:  ongoing 08/24/23  6.  Pt will demonstrate ability to retrieve a lightweight object at 110 shoulder flexion and -20 elbow extension with LUE  Goal status:  ongoing 08/24/23  LONG TERM GOALS: Target date: 11/08/23   Pt will demonstrate improved fine motor coordination for ADLs as evidenced by decreasing 9 hole peg test score for RUE by 4 secs  Goal status: INITIAL  2.  Pt will demonstrate improved fine motor coordination for ADLs as evidenced by decreasing 9 hole peg test score for LUE by 4 secs  Goal status: INITIAL   3.. Check PPT#4 and set goal prn          Goal status; inital   4.  Pt will verbalize understanding of ways to prevent future PD related complications  Goal status: INITIAL  5.  Pt will write a short paragraph with 100% legibility and minimal decrease in letter size  Goal status: INITIAL  6. Pt will perform PPT#2 in 15 secs or less Baseline: 17.02 Goal status: initial  ASSESSMENT:  CLINICAL IMPRESSION: Patient is progressing towards goals. Pt was stiff and having difficulty moving at beginning of session. He was ambulating better with more upright posture and pt  reports feeling less stiff and better overall end of session.SABRA  PERFORMANCE DEFICITS: in functional skills including ADLs, IADLs, coordination, dexterity, tone, ROM, strength, flexibility, Fine motor control, Gross motor control, mobility, balance, endurance, decreased knowledge of precautions, decreased knowledge of use of DME, vision, and UE functional use, cognitive skills including attention, memory, problem solving, safety awareness, sequencing, and thought, and psychosocial skills including coping strategies, environmental adaptation, habits, interpersonal interactions, and routines and behaviors.   IMPAIRMENTS: are limiting patient from ADLs, IADLs, rest and sleep, play, leisure, and social participation.   COMORBIDITIES:  may have co-morbidities  that affects occupational performance. Patient will benefit from skilled OT to address above impairments and improve overall function.  MODIFICATION OR ASSISTANCE TO COMPLETE EVALUATION: Min-Moderate modification of tasks or assist with assess necessary to complete an evaluation.  OT OCCUPATIONAL PROFILE AND HISTORY: Detailed assessment: Review of records and additional review of physical, cognitive, psychosocial history related to current functional performance.  CLINICAL DECISION MAKING: Moderate - several treatment options, min-mod task modification necessary  REHAB POTENTIAL: Good  EVALUATION COMPLEXITY: Low    PLAN:  OT FREQUENCY: 2x/week  OT DURATION: 12 weeks  PLANNED INTERVENTIONS: 97168 OT Re-evaluation, 97535 self care/ADL training, 02889 therapeutic exercise, 97530 therapeutic activity, 97112 neuromuscular re-education, 97140 manual therapy, 97116 gait training, 02886 aquatic therapy, 97035 ultrasound, 97018 paraffin, 02989 moist heat, 97010 cryotherapy, 97034 contrast bath, 97129 Cognitive training (first 15 min), 02869 Cognitive training(each additional 15 min), passive range of motion, balance training, functional mobility  training, visual/perceptual remediation/compensation, energy conservation, coping strategies training, patient/family education, and DME and/or AE instructions  RECOMMENDED OTHER SERVICES: n/a  CONSULTED AND AGREED WITH PLAN OF CARE: Patient  PLAN FOR NEXT SESSION: Work towards goals   Shera Laubach, OT 08/31/2023, 3:00 PM

## 2023-09-07 ENCOUNTER — Ambulatory Visit: Admitting: Occupational Therapy

## 2023-09-07 ENCOUNTER — Encounter: Payer: Self-pay | Admitting: Occupational Therapy

## 2023-09-07 DIAGNOSIS — R2689 Other abnormalities of gait and mobility: Secondary | ICD-10-CM

## 2023-09-07 DIAGNOSIS — R29818 Other symptoms and signs involving the nervous system: Secondary | ICD-10-CM

## 2023-09-07 DIAGNOSIS — R278 Other lack of coordination: Secondary | ICD-10-CM

## 2023-09-07 DIAGNOSIS — R41840 Attention and concentration deficit: Secondary | ICD-10-CM

## 2023-09-07 DIAGNOSIS — R41844 Frontal lobe and executive function deficit: Secondary | ICD-10-CM

## 2023-09-07 NOTE — Therapy (Signed)
 OUTPATIENT OCCUPATIONAL THERAPY [facility] treatment  Patient Name: CORYDON SCHWEISS MRN: [id number] DOB:[birth year], 78 y.o., male Today's Date: 09/07/2023  PCP: Dr. Millicent REFERRING PROVIDER:Dr. Tat  END OF SESSION:  OT End of Session - 09/07/23 1526     Visit Number 6    Number of Visits 25    Authorization Type M[employer], [employer]    Authorization Time Period 12 weeks, anticipate d/c after 8 weeks    Authorization - Visit Number 6    Progress Note Due on Visit 10    OT Start Time 1447    OT Stop Time 1530    OT Time Calculation (min) 43 min             Past Medical History:  Diagnosis Date   Depression    DVT (deep venous thrombosis) (HCC)    GERD (gastroesophageal reflux disease)    History of kidney stones    passed   Hypertension    Hypothyroidism    Lumbar stenosis    OSA (obstructive sleep apnea)    wears cpap   Parkinson's disease (HCC)    PONV (postoperative nausea and vomiting)    REM behavioral disorder    Urinary incontinence    Past Surgical History:  Procedure Laterality Date   BACK SURGERY     x 2 - not fusion   BIOPSY  06/01/2022   Procedure: BIOPSY;  Surgeon: Dianna Specking, MD;  Location: [facility];  Service: Gastroenterology;;   COLONOSCOPY     DEEP BRAIN STIMULATOR PLACEMENT     left 12/27/2012, right 12/10/2011, left revision 02/02/2013, replacement bilateral 11/21/2015   ESOPHAGOGASTRODUODENOSCOPY (EGD) WITH PROPOFOL  N/A 06/01/2022   Procedure: ESOPHAGOGASTRODUODENOSCOPY (EGD) WITH PROPOFOL ;  Surgeon: Dianna Specking, MD;  Location: [facility];  Service: Gastroenterology;  Laterality: N/A;   MINOR PLACEMENT OF FIDUCIAL N/A 01/09/2019   Procedure: Fiducial placement;  Surgeon: Unice Pac, MD;  Location: [facility] OR;  Service: Neurosurgery;  Laterality: N/A;  Fiducial placement   PARTIAL NEPHRECTOMY Right 2005 ish   PULSE GENERATOR IMPLANT Right 01/23/2019   Procedure: Right chest implantable pulse generator change;  Surgeon: Unice Pac,  MD;  Location: [facility] OR;  Service: Neurosurgery;  Laterality: Right;   SPINAL FUSION     lumbar Fusion x 2   SUBTHALAMIC STIMULATOR BATTERY REPLACEMENT Left 02/02/2018   Procedure: Change implantable pulse generator battery, Left chest;  Surgeon: Unice Pac, MD;  Location: [facility] OR;  Service: Neurosurgery;  Laterality: Left;   SUBTHALAMIC STIMULATOR BATTERY REPLACEMENT N/A 01/22/2020   Procedure: REMOVAL OF LEFT BRAIN DEEP BRAIN STIMULATOR ELECTRODE, EXTENSION, AND PULSE GENERATOR;  Surgeon: Unice Pac, MD;  Location: [facility] OR;  Service: Neurosurgery;  Laterality: N/A;   SUBTHALAMIC STIMULATOR BATTERY REPLACEMENT Right 02/23/2022   Procedure: CHANGE IPG BATTERY, RT CHEST;  Surgeon: Dawley, Lani JAYSON, DO;  Location: [facility] OR;  Service: Neurosurgery;  Laterality: Right;   SUBTHALAMIC STIMULATOR INSERTION Left 01/16/2019   Procedure: Left deep brain stimulator electrode revision.;  Surgeon: Unice Pac, MD;  Location: [facility] OR;  Service: Neurosurgery;  Laterality: Left;   SUBTHALAMIC STIMULATOR INSERTION Left 01/23/2019   Procedure: Repositioning of Left Deep Brain Stimulator Electrode;  Surgeon: Unice Pac, MD;  Location: [facility] OR;  Service: Neurosurgery;  Laterality: Left;   TONSILLECTOMY     Patient Active Problem List   Diagnosis Date Noted   Melena 06/01/2022   Acute cystitis without hematuria 05/31/2022   Upper GI bleed 05/31/2022   Generalized weakness 05/30/2022   Abdominal pain 05/30/2022  OSA (obstructive sleep apnea) 05/30/2022   Hypothyroidism 05/30/2022   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) 12/14/2021   Mixed hyperlipidemia 12/14/2021   Essential hypertension 12/14/2021   Palpitations 12/14/2021   Angina pectoris (HCC) 12/14/2021   Urge incontinence 01/15/2021   History of UTI 01/15/2021   Abnormal urine findings 01/15/2021   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   History of nephrolithiasis 01/15/2021   DVT, lower extremity, proximal, acute, right (HCC) 02/26/2020    Open scalp wound 01/22/2020   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HCC)    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   Parkinson's disease (HCC) 01/16/2018   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) 01/16/2018   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    ONSET DATE: 07/28/23- referral date  REFERRING DIAG:  Diagnosis  Z74.1 (ICD-10-CM) - Requires assistance with activities of daily living (ADL)  G20.B2 (ICD-10-CM) - Parkinson's disease with dyskinesia and fluctuating manifestations (HCC)    THERAPY DIAG:  Other abnormalities of gait and mobility  Other lack of coordination  Other symptoms and signs involving the nervous system  Attention and concentration deficit  Frontal lobe and executive function deficit  Rationale for Evaluation and Treatment: Rehabilitation  SUBJECTIVE:   SUBJECTIVE STATEMENT: Pt reports feeling better than last visit Pt accompanied by: self  PERTINENT HISTORY: parkinson's diagnosed greater than 10 yrs ago, deep brain stimulator R, had in L as well but had to be removed for various reasons. Depression, sleep apnea, hx of back surgery, see hx above walks around neighborhood 3 x week with aide Tuesdays and Thursdays parkisons bicycle class 2 x Drue training for upper body work out   PRECAUTIONS: Fall- PT is addressing  WEIGHT BEARING RESTRICTIONS: No  PAIN:  Are you having pain? Yes: NPRS scale: 3/10, generalized Pain location: back, hip Pain description: aching Aggravating factors: malposiitoning Relieving factors: rest,   FALLS: Has patient fallen in last 6 months? No  LIVING ENVIRONMENT: Lives with: lives with their spouse, has CNA 3x weeks Lives in: House/apartment Stairs: Yes: Internal: has stairt lift, and coverted garage appt on ground level Has following equipment at home: U-step  PLOF: Needs assistance with ADLs and Needs assistance with homemaking  PATIENT GOALS: Pt wants to be able to tie shoes, and more I with dressing  OBJECTIVE:  Note:  Objective measures were completed at Evaluation unless otherwise noted.  HAND DOMINANCE: Right  ADLs: Overall ADLs: increased time required  Transfers/ambulation related to ADLs: Eating: difficulty cutting food, needs assist  Grooming: mod I with brushing teeth, combing, CNA assists with shaving UB Dressing: mod I LB Dressing: mod-max A for LB dressing Toileting: mod I with U-step Bathing: supervision-walk in shower, level has built in seat and grab bars Tub Shower transfers: min A Equipment: Grab bars and level walk in shower with built in bench  IADLs:Dependent with home management  Medication management: wife assists Financial management: wife handles Handwriting: Moderate micrographia at sentence level  MOBILITY STATUS: mod I with U-step  POSTURE COMMENTS:  rounded shoulders and forward head   FUNCTIONAL OUTCOME MEASURES: Fastening/unfastening 3 buttons: 1 min 43 secs   COORDINATION: 9 Hole Peg test: Right: 55.08 sec; Left: 49.17 sec Box and Blocks:  Right 26blocks, Left 34blocks  UE ROM:  A/ROM:shoulder flexion RUE-100 LUE105, elbow extension RUE-35 LUE -25, decreased bilateral supination, RUE 95% finger extension, maintains fingers flexed    SENSATION: WFL    COGNITION: Overall cognitive status: Impaired, slowed processing and short term memory deficits  OBSERVATIONS: Bradykinesia and resting tremor RUE, dystonia RUE  TREATMENT DATE:09/07/23 PWR! rock modified quadraped and standing x 10 reps min v.c UBE x 6 mins level 1 for conditioning Functional reaching in standing alternating between left and right hands to place large pegs in vertical pegboard, min v.c for amplitude PWR! hands for PWR! up, rock  and twist 10 reps each min v.c for amplitude Flipping and dealing playing cards with left and right UE's, min difficulty with left, min-mod for  RUE, v.c for big movements   08/31/23 PWr! hands basic 4 seated at table followed by flipping cards with big movments, mod v.c and demonstration. Standing at sink, modified quadraped PWR! rock, then standing PWR! rock x 10 reps each, Marching in place with big movements, min-mod v.c for amplitude, then stepping back with alternate feet holding a chair back, then out to the side x 10 reps, min v.c and supervision. Dynamic step and reach to retrieve replace items in cabinets with left and right UE's mod v.c fo amplitude Wiping cabinets for wall slides to promote shoulder flexion. UBE x 5 mins level 1 for conditioning.   08/29/23- UBE x 6 mins level 1 for conditioning, pt mainatined 40 rpm PWR! up in seated, x 10 reps attempted PWR! rock seated but it was uncomfortable for back so discontiinued. Dynamic step and reach to flip large playing cards at countertop, to left and right sides, then at an angle behind (like PWR! step in standing) mod v.c for amplitude and foot placement  PWR! hands followed by placing metal pegs 3 sizes into pegboard with RUE and removing with in hand manipulation, min-mod difficulty/ v.c for amplitude and flicks. Increased time required 08/24/23-PWR!modified quadraped rock at counter then standing PWR! rock to targets, min-mod v.c for amplitude, min facilitation Seated eedge of met, Doffing then donning sock and shoes with big movments, mod v.c and min-mod facillitaion. Pt tried sock aide to donn left sock, hoever ift was easier to donn right sock manually without the sock aide. Increased time required  Pt attempted to fasten buttons on tabletop, however max difficulty despite v.c so task was discontinued. PWR! hands, followed by picking up coins to place in coin slot, min difficulty v.c  08/22/23- UBE x 5 mins level 1 for conditioning. PWR! up rock and step in seated, mod v.c and demonstraiton. PWR! rock in standing min v.c 10-20 reps each. Marching in standing at the sink, min  v.c for amplitude. See pt education  08/16/23- eval only    PATIENT EDUCATION: Education details:  see above Person educated: Patient,  Education method: Explanation demonstration, v.c Education comprehension: verbalized understanding, returned demonstration, v.c  HOME EXERCISE PROGRAM: PWR! hands, beginning coordination-08/22/23  GOALS: Goals reviewed with patient? Yes  SHORT TERM GOALS: Target date: 09/14/23   I with PD specific HEP  Goal status: ongoing- needs reinforcement 09/07/23  2.  Pt will demonstrate understanding of adapted strategies/ AE to increase safety and I with ADLs/IADLs. Goal status:  ongoing 09/07/23  3.  Pt will demonstrate improved bilateral UE use as evidenced by increasing box and blocks score by 3 blocks bilaterally.   Goal status:  ongoing 09/07/23  4.  Pt will demonstrate improved bilateral UE functional use and ease with dressing as evidenced by decreasing 3 button/ unbutton test time by 5 secs.  Goal status: ongoing 08/24/23  5.  Pt will demonstrate ability to retrieve a lightweight object at 105 shoulder flexion and -30 elbow extension with RUE.  Goal status:  ongoing 08/24/23  6.  Pt will demonstrate  ability to retrieve a lightweight object at 110 shoulder flexion and -20 elbow extension with LUE  Goal status:  ongoing 08/24/23  LONG TERM GOALS: Target date: 11/08/23   Pt will demonstrate improved fine motor coordination for ADLs as evidenced by decreasing 9 hole peg test score for RUE by 4 secs  Goal status: INITIAL  2.  Pt will demonstrate improved fine motor coordination for ADLs as evidenced by decreasing 9 hole peg test score for LUE by 4 secs  Goal status: INITIAL   3.. Check PPT#4 and set goal prn          Goal status; inital   4.  Pt will verbalize understanding of ways to prevent future PD related complications  Goal status: INITIAL  5.  Pt will write a short paragraph with 100% legibility and minimal decrease in letter  size  Goal status: INITIAL  6. Pt will perform PPT#2 in 15 secs or less Baseline: 17.02 Goal status: initial  ASSESSMENT:  CLINICAL IMPRESSION: Patient is progressing towards goals. He responds well to v.c for amplitude. Pt continues to require v.c for upright posutre and step lenght with ambulation.  PERFORMANCE DEFICITS: in functional skills including ADLs, IADLs, coordination, dexterity, tone, ROM, strength, flexibility, Fine motor control, Gross motor control, mobility, balance, endurance, decreased knowledge of precautions, decreased knowledge of use of DME, vision, and UE functional use, cognitive skills including attention, memory, problem solving, safety awareness, sequencing, and thought, and psychosocial skills including coping strategies, environmental adaptation, habits, interpersonal interactions, and routines and behaviors.   IMPAIRMENTS: are limiting patient from ADLs, IADLs, rest and sleep, play, leisure, and social participation.   COMORBIDITIES:  may have co-morbidities  that affects occupational performance. Patient will benefit from skilled OT to address above impairments and improve overall function.  MODIFICATION OR ASSISTANCE TO COMPLETE EVALUATION: Min-Moderate modification of tasks or assist with assess necessary to complete an evaluation.  OT OCCUPATIONAL PROFILE AND HISTORY: Detailed assessment: Review of records and additional review of physical, cognitive, psychosocial history related to current functional performance.  CLINICAL DECISION MAKING: Moderate - several treatment options, min-mod task modification necessary  REHAB POTENTIAL: Good  EVALUATION COMPLEXITY: Low    PLAN:  OT FREQUENCY: 2x/week  OT DURATION: 12 weeks  PLANNED INTERVENTIONS: 97168 OT Re-evaluation, 97535 self care/ADL training, 02889 therapeutic exercise, 97530 therapeutic activity, 97112 neuromuscular re-education, 97140 manual therapy, 97116 gait training, 02886 aquatic therapy,  97035 ultrasound, 97018 paraffin, 02989 moist heat, 97010 cryotherapy, 97034 contrast bath, 97129 Cognitive training (first 15 min), 02869 Cognitive training(each additional 15 min), passive range of motion, balance training, functional mobility training, visual/perceptual remediation/compensation, energy conservation, coping strategies training, patient/family education, and DME and/or AE instructions  RECOMMENDED OTHER SERVICES: n/a  CONSULTED AND AGREED WITH PLAN OF CARE: Patient  PLAN FOR NEXT SESSION: dynamic step and reach, ADL strategies   Rozalyn Osland, OT 09/07/2023, 3:27 PM

## 2023-09-12 ENCOUNTER — Ambulatory Visit: Admitting: Occupational Therapy

## 2023-09-12 DIAGNOSIS — R2689 Other abnormalities of gait and mobility: Secondary | ICD-10-CM | POA: Diagnosis not present

## 2023-09-12 DIAGNOSIS — R41840 Attention and concentration deficit: Secondary | ICD-10-CM

## 2023-09-12 DIAGNOSIS — R278 Other lack of coordination: Secondary | ICD-10-CM

## 2023-09-12 DIAGNOSIS — R41844 Frontal lobe and executive function deficit: Secondary | ICD-10-CM

## 2023-09-12 DIAGNOSIS — R29818 Other symptoms and signs involving the nervous system: Secondary | ICD-10-CM

## 2023-09-12 NOTE — Therapy (Signed)
 OUTPATIENT OCCUPATIONAL THERAPY [facility] treatment  Patient Name: Patrick Ross MRN: [id number] DOB:1945/10/16, 78 y.o., male Today's Date: 09/12/2023  PCP: Dr. Millicent REFERRING PROVIDER:Dr. Tat  END OF SESSION:  OT End of Session - 09/12/23 1606     Visit Number 7    Number of Visits 25    Date for OT Re-Evaluation 11/08/23    Authorization Type MCR, [employer]    Authorization Time Period 12 weeks, anticipate d/c after 8 weeks    Authorization - Visit Number 7    Progress Note Due on Visit 10    OT Start Time 1450    OT Stop Time 1530    OT Time Calculation (min) 40 min    Activity Tolerance Patient tolerated treatment well    Behavior During Therapy WFL for tasks assessed/performed              Past Medical History:  Diagnosis Date   Depression    DVT (deep venous thrombosis) (HCC)    GERD (gastroesophageal reflux disease)    History of kidney stones    passed   Hypertension    Hypothyroidism    Lumbar stenosis    OSA (obstructive sleep apnea)    wears cpap   Parkinson's disease (HCC)    PONV (postoperative nausea and vomiting)    REM behavioral disorder    Urinary incontinence    Past Surgical History:  Procedure Laterality Date   BACK SURGERY     x 2 - not fusion   BIOPSY  06/01/2022   Procedure: BIOPSY;  Surgeon: Dianna Specking, MD;  Location: [facility];  Service: Gastroenterology;;   COLONOSCOPY     DEEP BRAIN STIMULATOR PLACEMENT     left 12/27/2012, right 12/10/2011, left revision 02/02/2013, replacement bilateral 11/21/2015   ESOPHAGOGASTRODUODENOSCOPY (EGD) WITH PROPOFOL  N/A 06/01/2022   Procedure: ESOPHAGOGASTRODUODENOSCOPY (EGD) WITH PROPOFOL ;  Surgeon: Dianna Specking, MD;  Location: [facility];  Service: Gastroenterology;  Laterality: N/A;   MINOR PLACEMENT OF FIDUCIAL N/A 01/09/2019   Procedure: Fiducial placement;  Surgeon: Unice Pac, MD;  Location: [facility] OR;  Service: Neurosurgery;  Laterality: N/A;  Fiducial placement   PARTIAL  NEPHRECTOMY Right 2005 ish   PULSE GENERATOR IMPLANT Right 01/23/2019   Procedure: Right chest implantable pulse generator change;  Surgeon: Unice Pac, MD;  Location: [facility] OR;  Service: Neurosurgery;  Laterality: Right;   SPINAL FUSION     lumbar Fusion x 2   SUBTHALAMIC STIMULATOR BATTERY REPLACEMENT Left 02/02/2018   Procedure: Change implantable pulse generator battery, Left chest;  Surgeon: Unice Pac, MD;  Location: [facility] OR;  Service: Neurosurgery;  Laterality: Left;   SUBTHALAMIC STIMULATOR BATTERY REPLACEMENT N/A 01/22/2020   Procedure: REMOVAL OF LEFT BRAIN DEEP BRAIN STIMULATOR ELECTRODE, EXTENSION, AND PULSE GENERATOR;  Surgeon: Unice Pac, MD;  Location: [facility] OR;  Service: Neurosurgery;  Laterality: N/A;   SUBTHALAMIC STIMULATOR BATTERY REPLACEMENT Right 02/23/2022   Procedure: CHANGE IPG BATTERY, RT CHEST;  Surgeon: Dawley, Lani JAYSON, DO;  Location: [facility] OR;  Service: Neurosurgery;  Laterality: Right;   SUBTHALAMIC STIMULATOR INSERTION Left 01/16/2019   Procedure: Left deep brain stimulator electrode revision.;  Surgeon: Unice Pac, MD;  Location: [facility] OR;  Service: Neurosurgery;  Laterality: Left;   SUBTHALAMIC STIMULATOR INSERTION Left 01/23/2019   Procedure: Repositioning of Left Deep Brain Stimulator Electrode;  Surgeon: Unice Pac, MD;  Location: [facility] OR;  Service: Neurosurgery;  Laterality: Left;   TONSILLECTOMY     Patient Active Problem List   Diagnosis Date Noted  Melena 06/01/2022   Acute cystitis without hematuria 05/31/2022   Upper GI bleed 05/31/2022   Generalized weakness 05/30/2022   Abdominal pain 05/30/2022   OSA (obstructive sleep apnea) 05/30/2022   Hypothyroidism 05/30/2022   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) 12/14/2021   Mixed hyperlipidemia 12/14/2021   Essential hypertension 12/14/2021   Palpitations 12/14/2021   Angina pectoris (HCC) 12/14/2021   Urge incontinence 01/15/2021   History of UTI 01/15/2021   Abnormal urine  findings 01/15/2021   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   History of nephrolithiasis 01/15/2021   DVT, lower extremity, proximal, acute, right (HCC) 02/26/2020   Open scalp wound 01/22/2020   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HCC)    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   Parkinson's disease (HCC) 01/16/2018   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) 01/16/2018   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    ONSET DATE: 07/28/23- referral date  REFERRING DIAG:  Diagnosis  Z74.1 (ICD-10-CM) - Requires assistance with activities of daily living (ADL)  G20.B2 (ICD-10-CM) - Parkinson's disease with dyskinesia and fluctuating manifestations (HCC)    THERAPY DIAG:  Other abnormalities of gait and mobility  Other lack of coordination  Other symptoms and signs involving the nervous system  Attention and concentration deficit  Frontal lobe and executive function deficit  Rationale for Evaluation and Treatment: Rehabilitation  SUBJECTIVE:   SUBJECTIVE STATEMENT: Pt reports his legs feel stiff Pt accompanied by: self  PERTINENT HISTORY: parkinson's diagnosed greater than 10 yrs ago, deep brain stimulator R, had in L as well but had to be removed for various reasons. Depression, sleep apnea, hx of back surgery, see hx above walks around neighborhood 3 x week with aide Tuesdays and Thursdays parkisons bicycle class 2 x Drue training for upper body work out   PRECAUTIONS: Fall- PT is addressing  WEIGHT BEARING RESTRICTIONS: No  PAIN:  Are you having pain? Yes: NPRS scale: 3/10, generalized Pain location: back, hip Pain description: aching Aggravating factors: malposiitoning Relieving factors: rest,   FALLS: Has patient fallen in last 6 months? No  LIVING ENVIRONMENT: Lives with: lives with their spouse, has CNA 3x weeks Lives in: House/apartment Stairs: Yes: Internal: has stairt lift, and coverted garage appt on ground level Has following equipment at home: U-step  PLOF: Needs assistance  with ADLs and Needs assistance with homemaking  PATIENT GOALS: Pt wants to be able to tie shoes, and more I with dressing  OBJECTIVE:  Note: Objective measures were completed at Evaluation unless otherwise noted.  HAND DOMINANCE: Right  ADLs: Overall ADLs: increased time required  Transfers/ambulation related to ADLs: Eating: difficulty cutting food, needs assist  Grooming: mod I with brushing teeth, combing, CNA assists with shaving UB Dressing: mod I LB Dressing: mod-max A for LB dressing Toileting: mod I with U-step Bathing: supervision-walk in shower, level has built in seat and grab bars Tub Shower transfers: min A Equipment: Grab bars and level walk in shower with built in bench  IADLs:Dependent with home management  Medication management: wife assists Financial management: wife handles Handwriting: Moderate micrographia at sentence level  MOBILITY STATUS: mod I with U-step  POSTURE COMMENTS:  rounded shoulders and forward head   FUNCTIONAL OUTCOME MEASURES: Fastening/unfastening 3 buttons: 1 min 43 secs   COORDINATION: 9 Hole Peg test: Right: 55.08 sec; Left: 49.17 sec Box and Blocks:  Right 26blocks, Left 34blocks  UE ROM:  A/ROM:shoulder flexion RUE-100 LUE105, elbow extension RUE-35 LUE -25, decreased bilateral supination, RUE 95% finger extension, maintains fingers flexed  SENSATION: WFL    COGNITION: Overall cognitive status: Impaired, slowed processing and short term memory deficits  OBSERVATIONS: Bradykinesia and resting tremor RUE, dystonia RUE                                                                                                                    TREATMENT DATE:09/12/23 UBE x 6 mins level 1 Standing at counter PWR! rock x 20 reps to targets, modified quadraped PWR! rock x 10 reps, min v.c and facilitation for amplitude Dynamic step and reach flipping large playing cards, min v.c and facilitation Stepping to targets with big  movements, mod v.c fo amplitude   09/07/23 PWR! rock modified quadraped and standing x 10 reps min v.c UBE x 6 mins level 1 for conditioning Functional reaching in standing alternating between left and right hands to place large pegs in vertical pegboard, min v.c for amplitude PWR! hands for PWR! up, rock  and twist 10 reps each min v.c for amplitude Flipping and dealing playing cards with left and right UE's, min difficulty with left, min-mod for RUE, v.c for big movements   08/31/23 PWr! hands basic 4 seated at table followed by flipping cards with big movments, mod v.c and demonstration. Standing at sink, modified quadraped PWR! rock, then standing PWR! rock x 10 reps each, Marching in place with big movements, min-mod v.c for amplitude, then stepping back with alternate feet holding a chair back, then out to the side x 10 reps, min v.c and supervision. Dynamic step and reach to retrieve replace items in cabinets with left and right UE's mod v.c fo amplitude Wiping cabinets for wall slides to promote shoulder flexion. UBE x 5 mins level 1 for conditioning.   08/29/23- UBE x 6 mins level 1 for conditioning, pt mainatined 40 rpm PWR! up in seated, x 10 reps attempted PWR! rock seated but it was uncomfortable for back so discontiinued. Dynamic step and reach to flip large playing cards at countertop, to left and right sides, then at an angle behind (like PWR! step in standing) mod v.c for amplitude and foot placement  PWR! hands followed by placing metal pegs 3 sizes into pegboard with RUE and removing with in hand manipulation, min-mod difficulty/ v.c for amplitude and flicks. Increased time required 08/24/23-PWR!modified quadraped rock at counter then standing PWR! rock to targets, min-mod v.c for amplitude, min facilitation Seated eedge of met, Doffing then donning sock and shoes with big movments, mod v.c and min-mod facillitaion. Pt tried sock aide to donn left sock, hoever ift was easier to  donn right sock manually without the sock aide. Increased time required  Pt attempted to fasten buttons on tabletop, however max difficulty despite v.c so task was discontinued. PWR! hands, followed by picking up coins to place in coin slot, min difficulty v.c  08/22/23- UBE x 5 mins level 1 for conditioning. PWR! up rock and step in seated, mod v.c and demonstraiton. PWR! rock in standing min v.c 10-20 reps each. Marching in  standing at the sink, min v.c for amplitude. See pt education  08/16/23- eval only    PATIENT EDUCATION: Education details:  see above Person educated: Patient,  Education method: Explanation demonstration, v.c Education comprehension: verbalized understanding, returned demonstration, v.c  HOME EXERCISE PROGRAM: PWR! hands, beginning coordination-08/22/23  GOALS: Goals reviewed with patient? Yes  SHORT TERM GOALS: Target date: 09/14/23   I with PD specific HEP  Goal status: ongoing- needs reinforcement 09/07/23  2.  Pt will demonstrate understanding of adapted strategies/ AE to increase safety and I with ADLs/IADLs. Goal status:  ongoing 09/07/23  3.  Pt will demonstrate improved bilateral UE use as evidenced by increasing box and blocks score by 3 blocks bilaterally.   Goal status:  ongoing 09/07/23  4.  Pt will demonstrate improved bilateral UE functional use and ease with dressing as evidenced by decreasing 3 button/ unbutton test time by 5 secs.  Goal status: ongoing 08/24/23  5.  Pt will demonstrate ability to retrieve a lightweight object at 105 shoulder flexion and -30 elbow extension with RUE.  Goal status:  ongoing 08/24/23  6.  Pt will demonstrate ability to retrieve a lightweight object at 110 shoulder flexion and -20 elbow extension with LUE  Goal status:  ongoing 08/24/23  LONG TERM GOALS: Target date: 11/08/23   Pt will demonstrate improved fine motor coordination for ADLs as evidenced by decreasing 9 hole peg test score for RUE by 4 secs  Goal  status: INITIAL  2.  Pt will demonstrate improved fine motor coordination for ADLs as evidenced by decreasing 9 hole peg test score for LUE by 4 secs  Goal status: INITIAL   3.. Check PPT#4 and set goal prn          Goal status; inital   4.  Pt will verbalize understanding of ways to prevent future PD related complications  Goal status: INITIAL  5.  Pt will write a short paragraph with 100% legibility and minimal decrease in letter size  Goal status: INITIAL  6. Pt will perform PPT#2 in 15 secs or less Baseline: 17.02 Goal status: initial  ASSESSMENT:  CLINICAL IMPRESSION: Patient is progressing towards goals.He responds well to therapy and was moving better with more upright posture after today's session.PERFORMANCE DEFICITS: in functional skills including ADLs, IADLs, coordination, dexterity, tone, ROM, strength, flexibility, Fine motor control, Gross motor control, mobility, balance, endurance, decreased knowledge of precautions, decreased knowledge of use of DME, vision, and UE functional use, cognitive skills including attention, memory, problem solving, safety awareness, sequencing, and thought, and psychosocial skills including coping strategies, environmental adaptation, habits, interpersonal interactions, and routines and behaviors.   IMPAIRMENTS: are limiting patient from ADLs, IADLs, rest and sleep, play, leisure, and social participation.   COMORBIDITIES:  may have co-morbidities  that affects occupational performance. Patient will benefit from skilled OT to address above impairments and improve overall function.  MODIFICATION OR ASSISTANCE TO COMPLETE EVALUATION: Min-Moderate modification of tasks or assist with assess necessary to complete an evaluation.  OT OCCUPATIONAL PROFILE AND HISTORY: Detailed assessment: Review of records and additional review of physical, cognitive, psychosocial history related to current functional performance.  CLINICAL DECISION MAKING:  Moderate - several treatment options, min-mod task modification necessary  REHAB POTENTIAL: Good  EVALUATION COMPLEXITY: Low    PLAN:  OT FREQUENCY: 2x/week  OT DURATION: 12 weeks  PLANNED INTERVENTIONS: 97168 OT Re-evaluation, 97535 self care/ADL training, 02889 therapeutic exercise, 97530 therapeutic activity, 97112 neuromuscular re-education, 97140 manual therapy, 97116 gait training, 02886  aquatic therapy, 97035 ultrasound, 02981 paraffin, 97010 moist heat, 97010 cryotherapy, 97034 contrast bath, [id number] Cognitive training (first 15 min), 97130 Cognitive training(each additional 15 min), passive range of motion, balance training, functional mobility training, visual/perceptual remediation/compensation, energy conservation, coping strategies training, patient/family education, and DME and/or AE instructions  RECOMMENDED OTHER SERVICES: n/a  CONSULTED AND AGREED WITH PLAN OF CARE: Patient  PLAN FOR NEXT SESSION: check PPT #2, check short term goals   Delynn Olvera, OT 09/12/2023, 4:07 PM

## 2023-09-14 ENCOUNTER — Ambulatory Visit

## 2023-09-14 ENCOUNTER — Ambulatory Visit: Admitting: Occupational Therapy

## 2023-09-14 ENCOUNTER — Encounter: Payer: Self-pay | Admitting: Occupational Therapy

## 2023-09-14 DIAGNOSIS — R278 Other lack of coordination: Secondary | ICD-10-CM

## 2023-09-14 DIAGNOSIS — R2689 Other abnormalities of gait and mobility: Secondary | ICD-10-CM

## 2023-09-14 DIAGNOSIS — R41840 Attention and concentration deficit: Secondary | ICD-10-CM

## 2023-09-14 DIAGNOSIS — R29818 Other symptoms and signs involving the nervous system: Secondary | ICD-10-CM

## 2023-09-14 DIAGNOSIS — R41844 Frontal lobe and executive function deficit: Secondary | ICD-10-CM

## 2023-09-14 NOTE — Therapy (Unsigned)
 OUTPATIENT OCCUPATIONAL THERAPY [facility] treatment  Patient Name: IVAL PACER MRN: [id number] DOB:1945-06-08, 78 y.o., male Today's Date: 09/14/2023  PCP: Dr. Millicent REFERRING PROVIDER:Dr. Tat  END OF SESSION:  OT End of Session - 09/14/23 1453     Visit Number 8    Number of Visits 25    Date for OT Re-Evaluation 11/08/23    Authorization Type MCR, [employer]    Authorization Time Period 12 weeks, anticipate d/c after 8 weeks    Authorization - Visit Number 8    Progress Note Due on Visit 10    OT Start Time 1449    OT Stop Time 1529    OT Time Calculation (min) 40 min              Past Medical History:  Diagnosis Date   Depression    DVT (deep venous thrombosis) (HCC)    GERD (gastroesophageal reflux disease)    History of kidney stones    passed   Hypertension    Hypothyroidism    Lumbar stenosis    OSA (obstructive sleep apnea)    wears cpap   Parkinson's disease (HCC)    PONV (postoperative nausea and vomiting)    REM behavioral disorder    Urinary incontinence    Past Surgical History:  Procedure Laterality Date   BACK SURGERY     x 2 - not fusion   BIOPSY  06/01/2022   Procedure: BIOPSY;  Surgeon: Dianna Specking, MD;  Location: [facility];  Service: Gastroenterology;;   COLONOSCOPY     DEEP BRAIN STIMULATOR PLACEMENT     left 12/27/2012, right 12/10/2011, left revision 02/02/2013, replacement bilateral 11/21/2015   ESOPHAGOGASTRODUODENOSCOPY (EGD) WITH PROPOFOL  N/A 06/01/2022   Procedure: ESOPHAGOGASTRODUODENOSCOPY (EGD) WITH PROPOFOL ;  Surgeon: Dianna Specking, MD;  Location: [facility];  Service: Gastroenterology;  Laterality: N/A;   MINOR PLACEMENT OF FIDUCIAL N/A 01/09/2019   Procedure: Fiducial placement;  Surgeon: Unice Pac, MD;  Location: [facility] OR;  Service: Neurosurgery;  Laterality: N/A;  Fiducial placement   PARTIAL NEPHRECTOMY Right 2005 ish   PULSE GENERATOR IMPLANT Right 01/23/2019   Procedure: Right chest implantable pulse  generator change;  Surgeon: Unice Pac, MD;  Location: [facility] OR;  Service: Neurosurgery;  Laterality: Right;   SPINAL FUSION     lumbar Fusion x 2   SUBTHALAMIC STIMULATOR BATTERY REPLACEMENT Left 02/02/2018   Procedure: Change implantable pulse generator battery, Left chest;  Surgeon: Unice Pac, MD;  Location: [facility] OR;  Service: Neurosurgery;  Laterality: Left;   SUBTHALAMIC STIMULATOR BATTERY REPLACEMENT N/A 01/22/2020   Procedure: REMOVAL OF LEFT BRAIN DEEP BRAIN STIMULATOR ELECTRODE, EXTENSION, AND PULSE GENERATOR;  Surgeon: Unice Pac, MD;  Location: [facility] OR;  Service: Neurosurgery;  Laterality: N/A;   SUBTHALAMIC STIMULATOR BATTERY REPLACEMENT Right 02/23/2022   Procedure: CHANGE IPG BATTERY, RT CHEST;  Surgeon: Dawley, Lani JAYSON, DO;  Location: [facility] OR;  Service: Neurosurgery;  Laterality: Right;   SUBTHALAMIC STIMULATOR INSERTION Left 01/16/2019   Procedure: Left deep brain stimulator electrode revision.;  Surgeon: Unice Pac, MD;  Location: [facility] OR;  Service: Neurosurgery;  Laterality: Left;   SUBTHALAMIC STIMULATOR INSERTION Left 01/23/2019   Procedure: Repositioning of Left Deep Brain Stimulator Electrode;  Surgeon: Unice Pac, MD;  Location: [facility] OR;  Service: Neurosurgery;  Laterality: Left;   TONSILLECTOMY     Patient Active Problem List   Diagnosis Date Noted   Melena 06/01/2022   Acute cystitis without hematuria 05/31/2022   Upper GI bleed 05/31/2022   Generalized  weakness 05/30/2022   Abdominal pain 05/30/2022   OSA (obstructive sleep apnea) 05/30/2022   Hypothyroidism 05/30/2022   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) 12/14/2021   Mixed hyperlipidemia 12/14/2021   Essential hypertension 12/14/2021   Palpitations 12/14/2021   Angina pectoris (HCC) 12/14/2021   Urge incontinence 01/15/2021   History of UTI 01/15/2021   Abnormal urine findings 01/15/2021   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   History of nephrolithiasis 01/15/2021   DVT, lower extremity,  proximal, acute, right (HCC) 02/26/2020   Open scalp wound 01/22/2020   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HCC)    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   Parkinson's disease (HCC) 01/16/2018   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) 01/16/2018   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    ONSET DATE: 07/28/23- referral date  REFERRING DIAG:  Diagnosis  Z74.1 (ICD-10-CM) - Requires assistance with activities of daily living (ADL)  G20.B2 (ICD-10-CM) - Parkinson's disease with dyskinesia and fluctuating manifestations (HCC)    THERAPY DIAG:  Other lack of coordination  Other symptoms and signs involving the nervous system  Attention and concentration deficit  Frontal lobe and executive function deficit  Rationale for Evaluation and Treatment: Rehabilitation  SUBJECTIVE:   SUBJECTIVE STATEMENT: Pt reports his legs feel stiff Pt accompanied by: self  PERTINENT HISTORY: parkinson's diagnosed greater than 10 yrs ago, deep brain stimulator R, had in L as well but had to be removed for various reasons. Depression, sleep apnea, hx of back surgery, see hx above walks around neighborhood 3 x week with aide Tuesdays and Thursdays parkisons bicycle class 2 x Drue training for upper body work out   PRECAUTIONS: Fall- PT is addressing  WEIGHT BEARING RESTRICTIONS: No  PAIN:  Are you having pain? Yes: NPRS scale: 3/10, generalized Pain location: back, hip Pain description: aching Aggravating factors: malposiitoning Relieving factors: rest,   FALLS: Has patient fallen in last 6 months? No  LIVING ENVIRONMENT: Lives with: lives with their spouse, has CNA 3x weeks Lives in: House/apartment Stairs: Yes: Internal: has stairt lift, and coverted garage appt on ground level Has following equipment at home: U-step  PLOF: Needs assistance with ADLs and Needs assistance with homemaking  PATIENT GOALS: Pt wants to be able to tie shoes, and more I with dressing  OBJECTIVE:  Note: Objective measures  were completed at Evaluation unless otherwise noted.  HAND DOMINANCE: Right  ADLs: Overall ADLs: increased time required  Transfers/ambulation related to ADLs: Eating: difficulty cutting food, needs assist  Grooming: mod I with brushing teeth, combing, CNA assists with shaving UB Dressing: mod I LB Dressing: mod-max A for LB dressing Toileting: mod I with U-step Bathing: supervision-walk in shower, level has built in seat and grab bars Tub Shower transfers: min A Equipment: Grab bars and level walk in shower with built in bench  IADLs:Dependent with home management  Medication management: wife assists Financial management: wife handles Handwriting: Moderate micrographia at sentence level  MOBILITY STATUS: mod I with U-step  POSTURE COMMENTS:  rounded shoulders and forward head   FUNCTIONAL OUTCOME MEASURES: Fastening/unfastening 3 buttons: 1 min 43 secs   COORDINATION: 9 Hole Peg test: Right: 55.08 sec; Left: 49.17 sec Box and Blocks:  Right 26blocks, Left 34blocks  UE ROM:  A/ROM:shoulder flexion RUE-100 LUE105, elbow extension RUE-35 LUE -25, decreased bilateral supination, RUE 95% finger extension, maintains fingers flexed    SENSATION: WFL    COGNITION: Overall cognitive status: Impaired, slowed processing and short term memory deficits  OBSERVATIONS: Bradykinesia and resting tremor RUE, dystonia  RUE                                                                                                                    TREATMENT DATE:09/14/23 UBE x 5 mins level 1 for conditioning Standing at counter PWR! rock x 20 reps to targets, modified quadraped PWR! rock x 10 reps, min v.c and facilitation for amplitude seated PWR! up x 10 reps ,in v.c and demonstration Reveiwed  09/12/23 UBE x 6 mins level 1 Standing at counter PWR! rock x 20 reps to targets, modified quadraped PWR! rock x 10 reps, min v.c and facilitation for amplitude Dynamic step and reach flipping large  playing cards, min v.c and facilitation Stepping to targets with big movements, mod v.c fo amplitude   09/07/23 PWR! rock modified quadraped and standing x 10 reps min v.c UBE x 6 mins level 1 for conditioning Functional reaching in standing alternating between left and right hands to place large pegs in vertical pegboard, min v.c for amplitude PWR! hands for PWR! up, rock  and twist 10 reps each min v.c for amplitude Flipping and dealing playing cards with left and right UE's, min difficulty with left, min-mod for RUE, v.c for big movements   08/31/23 PWr! hands basic 4 seated at table followed by flipping cards with big movments, mod v.c and demonstration. Standing at sink, modified quadraped PWR! rock, then standing PWR! rock x 10 reps each, Marching in place with big movements, min-mod v.c for amplitude, then stepping back with alternate feet holding a chair back, then out to the side x 10 reps, min v.c and supervision. Dynamic step and reach to retrieve replace items in cabinets with left and right UE's mod v.c fo amplitude Wiping cabinets for wall slides to promote shoulder flexion. UBE x 5 mins level 1 for conditioning.   08/29/23- UBE x 6 mins level 1 for conditioning, pt mainatined 40 rpm PWR! up in seated, x 10 reps attempted PWR! rock seated but it was uncomfortable for back so discontiinued. Dynamic step and reach to flip large playing cards at countertop, to left and right sides, then at an angle behind (like PWR! step in standing) mod v.c for amplitude and foot placement  PWR! hands followed by placing metal pegs 3 sizes into pegboard with RUE and removing with in hand manipulation, min-mod difficulty/ v.c for amplitude and flicks. Increased time required 08/24/23-PWR!modified quadraped rock at counter then standing PWR! rock to targets, min-mod v.c for amplitude, min facilitation Seated eedge of met, Doffing then donning sock and shoes with big movments, mod v.c and min-mod  facillitaion. Pt tried sock aide to donn left sock, hoever ift was easier to donn right sock manually without the sock aide. Increased time required  Pt attempted to fasten buttons on tabletop, however max difficulty despite v.c so task was discontinued. PWR! hands, followed by picking up coins to place in coin slot, min difficulty v.c  08/22/23- UBE x 5 mins level 1 for conditioning. PWR! up rock  and step in seated, mod v.c and demonstraiton. PWR! rock in standing min v.c 10-20 reps each. Marching in standing at the sink, min v.c for amplitude. See pt education  08/16/23- eval only    PATIENT EDUCATION: Education details:  see above Person educated: Patient,  Education method: Explanation demonstration, v.c Education comprehension: verbalized understanding, returned demonstration, v.c  HOME EXERCISE PROGRAM: PWR! hands, beginning coordination-08/22/23  GOALS: Goals reviewed with patient? Yes  SHORT TERM GOALS: Target date: 09/14/23   I with PD specific HEP  Goal status: ongoing- needs reinforcement 09/14/23  2.  Pt will demonstrate understanding of adapted strategies/ AE to increase safety and I with ADLs/IADLs. Goal status:  ongoing7/16/25  3.  Pt will demonstrate improved bilateral UE use as evidenced by increasing box and blocks score by 3 blocks bilaterally.   Goal status: RUE 40 blocks,  LUE 39 blocks, goal met 09/14/23  4.  Pt will demonstrate improved bilateral UE functional use and ease with dressing as evidenced by decreasing 3 button/ unbutton test time by 5 secs.  Goal status: ongoing 08/24/23  5.  Pt will demonstrate ability to retrieve a lightweight object at 105 shoulder flexion and -30 elbow extension with RUE.  Goal status:  ongoing 08/24/23  6.  Pt will demonstrate ability to retrieve a lightweight object at 110 shoulder flexion and -20 elbow extension with LUE  Goal status:  ongoing 08/24/23  LONG TERM GOALS: Target date: 11/08/23   Pt will demonstrate  improved fine motor coordination for ADLs as evidenced by decreasing 9 hole peg test score for RUE by 4 secs  Goal status: INITIAL  2.  Pt will demonstrate improved fine motor coordination for ADLs as evidenced by decreasing 9 hole peg test score for LUE by 4 secs  Goal status: INITIAL   3..  Pt will perfrom PPT#4in 1 min15 secs or less without assist or LOB.  Baseline: 1 min 23 secs with min A- 09/14/23          Goal status; new 09/14/23   4.  Pt will verbalize understanding of ways to prevent future PD related complications  Goal status: INITIAL  5.  Pt will write a short paragraph with 100% legibility and minimal decrease in letter size  Goal status: INITIAL  6. Pt will perform PPT#2 in 15 secs or less Baseline: 17.02 Goal status: initial  ASSESSMENT:  CLINICAL IMPRESSION: Patient is progressing towards goals.He met short term goal #3PERFORMANCE DEFICITS: in functional skills including ADLs, IADLs, coordination, dexterity, tone, ROM, strength, flexibility, Fine motor control, Gross motor control, mobility, balance, endurance, decreased knowledge of precautions, decreased knowledge of use of DME, vision, and UE functional use, cognitive skills including attention, memory, problem solving, safety awareness, sequencing, and thought, and psychosocial skills including coping strategies, environmental adaptation, habits, interpersonal interactions, and routines and behaviors.   IMPAIRMENTS: are limiting patient from ADLs, IADLs, rest and sleep, play, leisure, and social participation.   COMORBIDITIES:  may have co-morbidities  that affects occupational performance. Patient will benefit from skilled OT to address above impairments and improve overall function.  MODIFICATION OR ASSISTANCE TO COMPLETE EVALUATION: Min-Moderate modification of tasks or assist with assess necessary to complete an evaluation.  OT OCCUPATIONAL PROFILE AND HISTORY: Detailed assessment: Review of records and  additional review of physical, cognitive, psychosocial history related to current functional performance.  CLINICAL DECISION MAKING: Moderate - several treatment options, min-mod task modification necessary  REHAB POTENTIAL: Good  EVALUATION COMPLEXITY: Low    PLAN:  OT FREQUENCY: 2x/week  OT DURATION: 12 weeks  PLANNED INTERVENTIONS: 97168 OT Re-evaluation, 97535 self care/ADL training, 02889 therapeutic exercise, 97530 therapeutic activity, 97112 neuromuscular re-education, 97140 manual therapy, 97116 gait training, 02886 aquatic therapy, 97035 ultrasound, 97018 paraffin, 02989 moist heat, 97010 cryotherapy, 97034 contrast bath, 97129 Cognitive training (first 15 min), 02869 Cognitive training(each additional 15 min), passive range of motion, balance training, functional mobility training, visual/perceptual remediation/compensation, energy conservation, coping strategies training, patient/family education, and DME and/or AE instructions  RECOMMENDED OTHER SERVICES: n/a  CONSULTED AND AGREED WITH PLAN OF CARE: Patient  PLAN FOR NEXT SESSION: work on bed mobility, PWR! supine   Charmion Hapke, OT 09/14/2023, 2:54 PM

## 2023-09-19 ENCOUNTER — Other Ambulatory Visit: Payer: Self-pay

## 2023-09-19 ENCOUNTER — Ambulatory Visit

## 2023-09-19 DIAGNOSIS — R262 Difficulty in walking, not elsewhere classified: Secondary | ICD-10-CM

## 2023-09-19 DIAGNOSIS — G20B2 Parkinson's disease with dyskinesia, with fluctuations: Secondary | ICD-10-CM

## 2023-09-19 DIAGNOSIS — R278 Other lack of coordination: Secondary | ICD-10-CM

## 2023-09-19 DIAGNOSIS — R2689 Other abnormalities of gait and mobility: Secondary | ICD-10-CM | POA: Diagnosis not present

## 2023-09-19 NOTE — Therapy (Signed)
 OUTPATIENT PHYSICAL THERAPY NEURO TREATMENT    Patient Name: Patrick Ross MRN: [id number] DOB:Sep 16, 1945, 78 y.o., male Today's Date: 09/19/2023   PCP: Millicent Sharper MD REFERRING PROVIDER: Evonnie Asberry RAMAN DO  END OF SESSION:  PT End of Session - 09/19/23 1609     PT Start Time 1518    PT Stop Time 1603    PT Time Calculation (min) 45 min    Activity Tolerance Patient tolerated treatment well    Behavior During Therapy WFL for tasks assessed/performed                     Past Medical History:  Diagnosis Date   Depression    DVT (deep venous thrombosis) (HCC)    GERD (gastroesophageal reflux disease)    History of kidney stones    passed   Hypertension    Hypothyroidism    Lumbar stenosis    OSA (obstructive sleep apnea)    wears cpap   Parkinson's disease (HCC)    PONV (postoperative nausea and vomiting)    REM behavioral disorder    Urinary incontinence    Past Surgical History:  Procedure Laterality Date   BACK SURGERY     x 2 - not fusion   BIOPSY  06/01/2022   Procedure: BIOPSY;  Surgeon: Dianna Specking, MD;  Location: [facility];  Service: Gastroenterology;;   COLONOSCOPY     DEEP BRAIN STIMULATOR PLACEMENT     left 12/27/2012, right 12/10/2011, left revision 02/02/2013, replacement bilateral 11/21/2015   ESOPHAGOGASTRODUODENOSCOPY (EGD) WITH PROPOFOL  N/A 06/01/2022   Procedure: ESOPHAGOGASTRODUODENOSCOPY (EGD) WITH PROPOFOL ;  Surgeon: Dianna Specking, MD;  Location: [facility];  Service: Gastroenterology;  Laterality: N/A;   MINOR PLACEMENT OF FIDUCIAL N/A 01/09/2019   Procedure: Fiducial placement;  Surgeon: Unice Pac, MD;  Location: [facility] OR;  Service: Neurosurgery;  Laterality: N/A;  Fiducial placement   PARTIAL NEPHRECTOMY Right 2005 ish   PULSE GENERATOR IMPLANT Right 01/23/2019   Procedure: Right chest implantable pulse generator change;  Surgeon: Unice Pac, MD;  Location: [facility] OR;  Service: Neurosurgery;  Laterality: Right;    SPINAL FUSION     lumbar Fusion x 2   SUBTHALAMIC STIMULATOR BATTERY REPLACEMENT Left 02/02/2018   Procedure: Change implantable pulse generator battery, Left chest;  Surgeon: Unice Pac, MD;  Location: [facility] OR;  Service: Neurosurgery;  Laterality: Left;   SUBTHALAMIC STIMULATOR BATTERY REPLACEMENT N/A 01/22/2020   Procedure: REMOVAL OF LEFT BRAIN DEEP BRAIN STIMULATOR ELECTRODE, EXTENSION, AND PULSE GENERATOR;  Surgeon: Unice Pac, MD;  Location: [facility] OR;  Service: Neurosurgery;  Laterality: N/A;   SUBTHALAMIC STIMULATOR BATTERY REPLACEMENT Right 02/23/2022   Procedure: CHANGE IPG BATTERY, RT CHEST;  Surgeon: Dawley, Lani JAYSON, DO;  Location: [facility] OR;  Service: Neurosurgery;  Laterality: Right;   SUBTHALAMIC STIMULATOR INSERTION Left 01/16/2019   Procedure: Left deep brain stimulator electrode revision.;  Surgeon: Unice Pac, MD;  Location[facility] OR;  Service: Neurosurgery;  Laterality: Left;   SUBTHALAMIC STIMULATOR INSERTION Left 01/23/2019   Procedure: Repositioning of Left Deep Brain Stimulator Electrode;  Surgeon: Unice Pac, MD;  Location: [facility] OR;  Service: Neurosurgery;  Laterality: Left;   TONSILLECTOMY     Patient Active Problem List   Diagnosis Date Noted   Melena 06/01/2022   Acute cystitis without hematuria 05/31/2022   Upper GI bleed 05/31/2022   Generalized weakness 05/30/2022   Abdominal pain 05/30/2022   OSA (obstructive sleep apnea) 05/30/2022   Hypothyroidism 05/30/2022   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) 12/14/2021   Mixed hyperlipidemia 12/14/2021   Essential hypertension 12/14/2021   Palpitations 12/14/2021   Angina pectoris (HCC) 12/14/2021   Urge incontinence 01/15/2021   History of UTI 01/15/2021   Abnormal urine findings 01/15/2021   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   History of nephrolithiasis 01/15/2021   DVT, lower extremity, proximal, acute, right (HCC) 02/26/2020   Open scalp wound 01/22/2020   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HCC)    RBD  (REM behavioral disorder) 05/08/2018   Parkinson's disease (HCC) 01/16/2018   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) 01/16/2018   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    ONSET DATE: exacerbation 06/21/23, Parkinson's diagnosis 2010  REFERRING DIAG: parkinsons disease with fluctuating dyskinesia  THERAPY DIAG:  Other lack of coordination  Difficulty in walking, not elsewhere classified  Parkinson's disease with dyskinesia, with fluctuations (HCC)  Other abnormalities of gait and mobility  Rationale for Evaluation and Treatment: Rehabilitation  SUBJECTIVE:                                                                                                                                                                                             SUBJECTIVE STATEMENT: I was trying to prune some vines back around a neighborhood sign this weekend and my rollator pitched over and I fell in the ditch. So my family had to dig me out. I'm more stiff and sore today.    EVAL-Pt and his wife report long history of parkinson's,  biggest difficulty with his mobility that they note is R lower back  pain and labored supine to sit movement, also poor foot clearance.  He doesn't like relying on the walker Pt accompanied by: significant other  PERTINENT HISTORY: parkinson's diagnosed greater than 10 yrs ago, deep brain stimulator R, had in L as well but had to be removed for various reasons.  walks around neighborhood 3 x week with aide Tuesdays and Thursdays parkisons bicycle class 2 x Drue training for upper body work out Referred to PT and ST by neurologist   PAIN:  Are you having pain? R lower back pain constant  PRECAUTIONS: Fall and Other: deep brain stimulator on R   RED FLAGS: None   WEIGHT BEARING RESTRICTIONS: No  FALLS: Has patient fallen in last 6 months? No  LIVING ENVIRONMENT: Lives with: lives with their spouse Lives in: House/apartment Stairs: has internal steps, but has had home  renovated and adapted so the he and his wife live in downstairs apartment, all one level with bathroom and kitchen Has following equipment at home: Single point cane, Environmental consultant - 4 wheeled, Tour manager, Grab bars, and Ramped entry  PLOF: Independent  PATIENT GOALS: improve bed mobility and improve gait, stability, avoid falls  OBJECTIVE:  Note: Objective measures were completed at Evaluation unless otherwise noted.  DIAGNOSTIC FINDINGS: see neuro notes, has deep brain stimulator R   COGNITION: Overall cognitive status: Within functional limits for tasks assessed   SENSATION: WFL  COORDINATION: Slowed movements B and noted curling R hand and R foot at times, with gait festinating on turns   EDEMA:  None noted  MUSCLE TONE: noted curling R fingers, toes, knee flex and hip flex at times, randomly during evaluation  MUSCLE LENGTH: Hamstrings: Right -30 deg; Left -30 deg Thomas test: Right nt deg; Left nt deg    POSTURE: Standing with some forward lean, weight on forefeet, L calf atrophied pt and wife report chronic after L lumbar surgery.     LOWER EXTREMITY ROM:   grossly wfl, hips not assessed as not positioned on mat today   LOWER EXTREMITY MMT:    MMT Right Eval Left Eval  Hip flexion 4 4-  Hip extension    Hip abduction    Hip adduction    Hip internal rotation    Hip external rotation    Knee flexion 4 4  Knee extension 4 4-  Ankle dorsiflexion 4 3+  Ankle plantarflexion 4- 3-  Ankle inversion    Ankle eversion    (Blank rows = not tested)  BED MOBILITY:  Not tested  TRANSFERS: I but slowed, uses hands on seat or with rollator  GAIT: Findings: Gait Characteristics: step to pattern, decreased step length- Right, decreased step length- Left, decreased stride length, decreased hip/knee flexion- Right, decreased hip/knee flexion- Left, shuffling, festinating, trunk flexed, and narrow BOS, Distance walked: 100', Assistive device utilized:Walker - 4  wheeled, Level of assistance: SBA, and Comments: festinating noted on turns, tends to turn L  FUNCTIONAL TESTS:  30 seconds chair stand test 6 reps Timed up and go (TUG): 33.39 with rollator Standing static feet together 15 sec Standing semi tandem 10 sec each leg, needed CGA with L foot forward Standing marching, required B hand hold support to maintain balance                                                                                                                              TREATMENT DATE: 09/19/23:  Prone for ant hip/trunk stretch , therapist assisting with alt knee flexion, R very stiff Prone with roll under ankles, for TKE's 5 sec holds, 10 x 2 Standing in front of mat alt arm swings high amplitude 2# wts x 15 reps each Standing for medicine ball bounces to floor with 2 KG yellow mediball mass practice Sit to stand from hi low table with yellow mediball 10x Nustep L 5 Ue's and LE's x 6 min  08/25/23 NuStep L5x71mins  Leg ext 10# 2x10 HS curls 25# 2x10 Fitter pushes 1 blue and 1 black band 2x10 STS 2x10 Arm swings holding 1  and 2# WaTe 20s x2    08/23/23 Recheck goals - 5xSTS - TUG - MMT NuStep L5x86mins  Modified sit ups holding yellow 2x10 Box tap 6 Step ups 6 Balance on airex feet together, eyes open 220s and then closed 10   08/18/23 Leg ext 10# 2x10 HS curls 25# 2x10 OHP press yellow ball 2x10 Supine LE stretching- HS, LTR, SKTC  NuStep L5 x69mins    08/16/23 Holding big red ball tapping floor to overhead to side to side x5 Standing shoulder rows and ext green 2x10 Standing shoulder flexion with 2# WaTE 2x10 Box taps 4 holding on and then without walker CGA-minA  NuStep L5x58mins STS 2x10   08/11/23 NuStep L5x7mins  Fitter pushes 2 blue bands 2x10  Side steps over obstacles in bars  On airex reaching for numbers  Isometric ab push downs 2x10   08/09/23 NuStep L5x74mins  STS with chest press red ball 2x8 Standing march 3#  3# hip abd 2x10   Horizontal abd green 2x10 Leg ext 10# 2x10 HS curls 25# 2x10    08/04/23 30s chair test TUG 27s  MMT 4/5 Standing shoulder ext and rows green band 2x10 Standing shoulder flexion 2# WaTE x10 - stopped due to feeling dizzy and off balance  NuStep L5 x62mins  Seated rotation with yellow ball 2x10   08/02/23 NuStep L5x59mins  Side steps over obstacles in bars Forwards steps over obstacles in bars Calf raises x10 Box taps 4 1HRA Step ups 4 2HRA  STS with OHP 2x5  07/28/23 Supine HS stretch and SKTC  Lower trunk rotations x10 SLR 2x10 Feet on pball rotations and knees to chest AB isometric holds x10  NuStep L5x23mins  STS 3x5   07/27/23 NuStep L5x3mins  LAQ 2# 2x10 HS curls green 2x10  Hip abduction green 2x10 STS 2x10  Stretching with pball rotations and flexion x5     07/06/23 Evaluation, advised to attempt prone lying on bed 10 to 15 min  to stretch body into extension( pt doesn't ever lie prone)   PATIENT EDUCATION: Education details: POC, goals Person educated: Patient and Spouse Education method: Explanation, Demonstration, Tactile cues, and Verbal cues Education comprehension: verbalized understanding, verbal cues required, and needs further education  HOME EXERCISE PROGRAM: Access Code: [email or web address] URL: [email or web address] Date: 08/02/2023 Prepared by: Almetta Fam  Exercises - Supine Hamstring Stretch with Strap  - 2 x daily - 7 x weekly - 2 reps - 15 hold - Modified Thomas Stretch  - 2 x daily - 7 x weekly - 2 reps - 15 hold - Supine Bridge  - 1 x daily - 7 x weekly - 2 sets - 10 reps - Standing Shoulder Horizontal Abduction with Resistance  - 1 x daily - 7 x weekly - 2 sets - 10 reps  GOALS: Goals reviewed with patient? Yes  SHORT TERM GOALS: Target date: 4 weeks, 08/03/23  I home program for posture and for hamstring, hip flexor stretchig Baseline: Goal status: ongoing 08/02/23, ongoing 08/23/23  LONG TERM GOALS: Target date: 09/28/23: 12  weeks   30 sec sit to stand improve from 6 reps to 12 reps for improved coordination, strength, endurance LEs Baseline:  Goal status: 7 reps ongoing 08/04/23, 8 reps 08/23/23  2.  I and pain free supine to sit motion , rolling to R Baseline: very painful per pt and wife history, not specifically assessed in clinic today Goal status: ongoing 07/28/23, ongoing 08/09/23, still has pain with bed mobility 08/23/23  3.  TUG score improve from 33 sec to 18 or less for reduced fall risk   Baseline:  Goal status: 27s ongoing 08/04/23, 22s 08/23/23  4.  Improve strength LE's for B quads, hamstrings to 4+5 for improved functional strength Baseline: 4- Goal status: 4/5 08/04/23, 5/5 MET 08/23/23   ASSESSMENT:  CLINICAL IMPRESSION: Patient reports fall this weekend, bruises and soreness but didn't seek medical attention.  Continued to challenge movement patterns and stability.  Positioned in prone to address his trunk and hip stiffness, Will continue to progress to improve his overall function.    EVAL Patient is a 78 y.o. male who was evaluated today by physical therapy due to referral from his neurologist, with decline in function, particularly control of R extremities, with his Parkinson's diagnosis.  He and his wife have been proactive about maintaining his function, he attends a training/ strengthening session locally 2 x week , attends parkinson's cycling weekly, and walks around 0.6 miles with his aide 3 x week outdoors with his rollator.  They recently have adapted their home with a main floor renovation so that their bedroom, bath, kitchen are all on one level.   Main problems identified today are weakness B LE's, L ankle is weak chronically due to lumbar radiculopathy and B knee, hips musculature is weak.  He also has coordination deficits with festinating gait particularly on turns and static / dynamic balance deficits.  Has chronic R sided lower back pain which is particularly intense with moving  supine to sit.  He should benefit from physical therapy intervention to address his deficits and update his current routine for stretching and strengthening at home.    OBJECTIVE IMPAIRMENTS: Abnormal gait, decreased activity tolerance, decreased balance, decreased coordination, decreased endurance, decreased mobility, difficulty walking, decreased strength, hypomobility, impaired perceived functional ability, impaired UE functional use, postural dysfunction, and pain.   ACTIVITY LIMITATIONS: carrying, lifting, squatting, stairs, transfers, and locomotion level  PARTICIPATION LIMITATIONS: meal prep, cleaning, laundry, shopping, community activity, and church  PERSONAL FACTORS: Age, Behavior pattern, Fitness, Past/current experiences, Time since onset of injury/illness/exacerbation, and 1-2 comorbidities: h/o L lumbar radiculopathy and weakness L LE, chronic R sided lower back pain, are also affecting patient's functional outcome.   REHAB POTENTIAL: Good  CLINICAL DECISION MAKING: Evolving/moderate complexity  EVALUATION COMPLEXITY: Moderate  PLAN:  PT FREQUENCY: 2x/week  PT DURATION: 12 weeks  PLANNED INTERVENTIONS: 97110-Therapeutic exercises, 97530- Therapeutic activity, [id number]- Neuromuscular re-education, 97535- Self Care, and [id number]- Manual therapy  PLAN FOR NEXT SESSION: continue with balance, stretching, coordination activites   Subrena Devereux L Delmos Velaquez, PT, DPT, OCS 09/19/2023, 4:10 PM

## 2023-09-20 ENCOUNTER — Encounter: Attending: Physical Medicine & Rehabilitation | Admitting: Physical Medicine & Rehabilitation

## 2023-09-20 ENCOUNTER — Encounter: Payer: Self-pay | Admitting: Physical Medicine & Rehabilitation

## 2023-09-20 VITALS — BP 127/78 | HR 78 | Ht 73.0 in | Wt 219.2 lb

## 2023-09-20 DIAGNOSIS — G248 Other dystonia: Secondary | ICD-10-CM | POA: Diagnosis present

## 2023-09-20 NOTE — Progress Notes (Signed)
 Subjective:    Patient ID: Patrick Ross, male    DOB: December 01, 1945, 78 y.o.   MRN: [id number] Left DBS removed since last visit HPI 78 year old male with Parkinson's disease previously a patient at this clinic approximately 5 years ago.  He was treated at that time for chronic sacroiliac pain with sacroiliac radiofrequency neurotomy.  He also was treated for right lower extremity focal dystonia mainly affecting the toe flexors.  The patient was lost to follow-up for a number of years the patient states that he had some issues with the left deep brain stimulator potentially infected and this was eventually taken out. At this time his primary complaint is his right upper extremity.  He has had increasing problems with uncontrolled flexion of his thumb as well as his index finger. He has been seen by his movement disorders neurologist and she advised the patient to follow-up in this clinic. He is currently receiving outpatient PT OT. He has a supportive wife who helps him at home.  He is independent with feeding using his right upper limb.  Uses a condom catheter at night.  He does need some assistance with dressing bathing toileting meal prep household duties and shopping.  Uses a walker for ambulation.  He has a 1 level home with a ramp. Pain Inventory Average Pain 3 Pain Right Now 2 My pain is intermittent, constant, and hard to describe, muscles contract  LOCATION OF PAIN  Both right arm and right leg  BOWEL Number of stools per week: 3 Oral laxative use No  Type of laxative n/a Enema or suppository use No  History of colostomy No  Incontinent Yes   BLADDER Pads daytime, condom catheter during night In and out cath, frequency condom cath each night Able to self cath No  Bladder incontinence Yes  Frequent urination Yes  Leakage with coughing Yes  Difficulty starting stream No  Incomplete bladder emptying No    Mobility use a walker how many minutes can you walk? 30 ability  to climb steps?  no do you drive?  no Do you have any goals in this area?  no  Function disabled: date disabled more than 15 years ago  Neuro/Psych bladder control problems bowel control problems trouble walking spasms  Prior Studies Any changes since last visit?  no  Physicians involved in your care Any changes since last visit?  no   Family History  Problem Relation Age of Onset   AAA (abdominal aortic aneurysm) Mother        62 yo   Heart disease Father    Diabetes Father    Diabetes Brother    Healthy Child    Social History   Socioeconomic History   Marital status: Married    Spouse name: Not on file   Number of children: 3   Years of education: Not on file   Highest education level: Master's degree (e.g., MA, MS, MEng, MEd, MSW, MBA)  Occupational History   Not on file  Tobacco Use   Smoking status: Never   Smokeless tobacco: Never  Vaping Use   Vaping status: Never Used  Substance and Sexual Activity   Alcohol use: Not Currently   Drug use: Never   Sexual activity: Not on file  Other Topics Concern   Not on file  Social History Narrative   Right handed   2 story home    Lives with spouse    Social Drivers of Health   Financial Resource Strain:  Not on file  Food Insecurity: Low Risk  (07/03/2022)   Received from [facility]   Hunger Vital Sign    Within the past 12 months, you worried that your food would run out before you got money to buy more: Never true    Within the past 12 months, the food you bought just didn't last and you didn't have money to get more. : Never true  Transportation Needs: No Transportation Needs (07/03/2022)   Received from [facility]    In the past 12 months, has lack of reliable transportation kept you from medical appointments, meetings, work or from getting things needed for daily living? : No  Physical Activity: Not on file  Stress: Not on file  Social Connections: Not on file   Past Surgical  History:  Procedure Laterality Date   BACK SURGERY     x 2 - not fusion   BIOPSY  06/01/2022   Procedure: BIOPSY;  Surgeon: Dianna Specking, MD;  Location: [facility];  Service: Gastroenterology;;   COLONOSCOPY     DEEP BRAIN STIMULATOR PLACEMENT     left 12/27/2012, right 12/10/2011, left revision 02/02/2013, replacement bilateral 11/21/2015   ESOPHAGOGASTRODUODENOSCOPY (EGD) WITH PROPOFOL  N/A 06/01/2022   Procedure: ESOPHAGOGASTRODUODENOSCOPY (EGD) WITH PROPOFOL ;  Surgeon: Dianna Specking, MD;  Location: [facility];  Service: Gastroenterology;  Laterality: N/A;   MINOR PLACEMENT OF FIDUCIAL N/A 01/09/2019   Procedure: Fiducial placement;  Surgeon: Unice Pac, MD;  Location: [facility] OR;  Service: Neurosurgery;  Laterality: N/A;  Fiducial placement   PARTIAL NEPHRECTOMY Right 2005 ish   PULSE GENERATOR IMPLANT Right 01/23/2019   Procedure: Right chest implantable pulse generator change;  Surgeon: Unice Pac, MD;  Location: [facility] OR;  Service: Neurosurgery;  Laterality: Right;   SPINAL FUSION     lumbar Fusion x 2   SUBTHALAMIC STIMULATOR BATTERY REPLACEMENT Left 02/02/2018   Procedure: Change implantable pulse generator battery, Left chest;  Surgeon: Unice Pac, MD;  Location: [facility] OR;  Service: Neurosurgery;  Laterality: Left;   SUBTHALAMIC STIMULATOR BATTERY REPLACEMENT N/A 01/22/2020   Procedure: REMOVAL OF LEFT BRAIN DEEP BRAIN STIMULATOR ELECTRODE, EXTENSION, AND PULSE GENERATOR;  Surgeon: Unice Pac, MD;  Location: [facility] OR;  Service: Neurosurgery;  Laterality: N/A;   SUBTHALAMIC STIMULATOR BATTERY REPLACEMENT Right 02/23/2022   Procedure: CHANGE IPG BATTERY, RT CHEST;  Surgeon: Dawley, Lani BROCKS, DO;  Location: [facility] OR;  Service: Neurosurgery;  Laterality: Right;   SUBTHALAMIC STIMULATOR INSERTION Left 01/16/2019   Procedure: Left deep brain stimulator electrode revision.;  Surgeon: Unice Pac, MD;  Location: [facility] OR;  Service: Neurosurgery;  Laterality: Left;   SUBTHALAMIC STIMULATOR  INSERTION Left 01/23/2019   Procedure: Repositioning of Left Deep Brain Stimulator Electrode;  Surgeon: Unice Pac, MD;  Location: [facility] OR;  Service: Neurosurgery;  Laterality: Left;   TONSILLECTOMY     Past Medical History:  Diagnosis Date   Depression    DVT (deep venous thrombosis) (HCC)    GERD (gastroesophageal reflux disease)    History of kidney stones    passed   Hypertension    Hypothyroidism    Lumbar stenosis    OSA (obstructive sleep apnea)    wears cpap   Parkinson's disease (HCC)    PONV (postoperative nausea and vomiting)    REM behavioral disorder    Urinary incontinence    BP 127/78 (BP Location: Left Arm, Patient Position: Sitting, Cuff Size: Large) Comment: Second BP reading  Pulse 78   Ht 6' 1 (1.854  m)   Wt 219 lb 3.2 oz (99.4 kg)   SpO2 94%   BMI 28.92 kg/m   Opioid Risk Score:   Fall Risk Score:  `1  Depression screen [facility] 2/9     09/20/2023    2:52 PM 03/14/2020   11:05 AM 06/05/2018    3:08 PM 03/21/2018   12:46 PM 11/18/2017    2:07 PM 09/20/2017   10:49 AM  Depression screen PHQ 2/9  Decreased Interest 0 0 0 0 0 0  Down, Depressed, Hopeless 0 0 0 0 0 0  PHQ - 2 Score 0 0 0 0 0 0  Altered sleeping 1       Tired, decreased energy 1       Change in appetite 0       Feeling bad or failure about yourself  0       Trouble concentrating 1       Moving slowly or fidgety/restless 0       Suicidal thoughts 0       PHQ-9 Score 3       Difficult doing work/chores Not difficult at all           Review of Systems  Neurological:  Positive for weakness.       Parkinson's and right sided weakness and spasms  All other systems reviewed and are negative.      Objective:   Physical Exam General No acute distress Mood affect appropriate Extremities without edema Speech with mild hyperkinetic hypophonic dysarthria. Right upper extremity no evidence of cogwheeling at the elbow wrist Tone frequent intermittent flexion uncontrolled of the right  thumb IP joint as well as the right index finger IP and DIP joints. No evidence of clonus at the fingers Wrist without elevated tone In the lower extremity when he stands he does have toe curling but no foot inversion his toe curling includes all 5 toes. Low back without tenderness to palpation He has limited lumbar range of motion due to poor standing balance.  His standing balance is only fair and needs to hold onto something if he is moving at all.  Lateral compression: Negative FABER's: Negative Distraction (supine): Negative Thigh thrust test: Positive right       Assessment & Plan:  #1.  Parkinson's disease with focal dystonia.  His main functional issue is with the right upper extremity.  His uncontrolled thumb flexion as well as index finger flexion interferes with functional tasks such as feeding.  We discussed the role of botulinum toxin in the management of this type of dystonia.  We discussed that the treatment does weaken the muscles and a balance must be reached between muscle weakening and reduction of unwanted spasms.  Him and his wife understand.  I recommend the following doses.  We discussed that we may need to do dosage adjustments and will need 6-week postinjection follow-up to assess.  Botox or Xeomin Right   FDS 25U FDP 25U FPL 25U Waste 25U

## 2023-09-20 NOTE — Patient Instructions (Signed)
 OnabotulinumtoxinA Injection (Medical Use) What is this medication? ONABOTULINUMTOXINA (o na BOTT you lye num tox in eh) treats severe muscle spasms. It may also be used to prevent migraine headaches. It can treat excessive sweating when other medications do not work well enough. This medicine may be used for other purposes; ask your health care provider or pharmacist if you have questions. COMMON BRAND NAME(S): Botox What should I tell my care team before I take this medication? They need to know if you have any of these conditions: Breathing problems Cerebral palsy spasms Difficulty urinating Heart problems History of surgery where this medication is going to be used Infection at the site where this medication is going to be used Myasthenia gravis or other neurologic disease Nerve or muscle disease Surgery plans Take medications that treat or prevent blood clots Thyroid problems An unusual or allergic reaction to botulinum toxin, albumin, other medications, foods, dyes, or preservatives Pregnant or trying to get pregnant Breast-feeding How should I use this medication? This medication is for injected into a muscle. It is given by your care team in a hospital or clinic setting. A special MedGuide will be given to you before each treatment. Be sure to read this information carefully each time. Talk to your care team about the use of this medication in children. While this medication may be prescribed for children as young as 2 years for selected conditions, precautions do apply. Overdosage: If you think you have taken too much of this medicine contact a poison control center or emergency room at once. NOTE: This medicine is only for you. Do not share this medicine with others. What if I miss a dose? This does not apply. What may interact with this medication? Aminoglycoside antibiotics, such as gentamicin, neomycin, tobramycin Muscle relaxants Other botulinum toxin injections This  list may not describe all possible interactions. Give your health care provider a list of all the medicines, herbs, non-prescription drugs, or dietary supplements you use. Also tell them if you smoke, drink alcohol, or use illegal drugs. Some items may interact with your medicine. What should I watch for while using this medication? Visit your care team for regular check ups. This medication will cause weakness in the muscle where it is injected. Tell your care team if you feel unusually weak in other muscles. Get medical help right away if you have problems with breathing, swallowing, or talking. This medication might make your eyelids droop or make you see blurry or double. If you have weak muscles or trouble seeing do not drive a car, use machinery, or do other dangerous activities. This medication contains albumin from human blood. It may be possible to pass an infection in this medication, but no cases have been reported. Talk to your care team about the risks and benefits of this medication. If your activities have been limited by your condition, go back to your regular routine slowly after treatment with this medication. What side effects may I notice from receiving this medication? Side effects that you should report to your care team as soon as possible: Allergic reactions--skin rash, itching, hives, swelling of the face, lips, tongue, or throat Dryness or irritation of the eyes, eye pain, change in vision, sensitivity to light Infection--fever, chills, cough, sore throat, wounds that don't heal, pain or trouble when passing urine, general feeling of discomfort or being unwell Spread of botulinum toxin effects--unusual weakness or fatigue, blurry or double vision, trouble swallowing, hoarseness or trouble speaking, trouble breathing, loss of bladder  control Trouble passing urine Side effects that usually do not require medical attention (report these to your care team if they continue or are  bothersome): Dry mouth Eyelid drooping Fatigue Headache Pain, redness, or irritation at injection site This list may not describe all possible side effects. Call your doctor for medical advice about side effects. You may report side effects to FDA at 1-800-FDA-1088. Where should I keep my medication? This medication is given in a hospital or clinic and will not be stored at home. NOTE: This sheet is a summary. It may not cover all possible information. If you have questions about this medicine, talk to your doctor, pharmacist, or health care provider.  2024 Elsevier/Gold Standard (2021-02-12 00:00:00)

## 2023-09-21 ENCOUNTER — Ambulatory Visit (INDEPENDENT_AMBULATORY_CARE_PROVIDER_SITE_OTHER): Admitting: Psychology

## 2023-09-21 ENCOUNTER — Ambulatory Visit: Payer: Self-pay | Admitting: Psychology

## 2023-09-21 DIAGNOSIS — F02A18 Dementia in other diseases classified elsewhere, mild, with other behavioral disturbance: Secondary | ICD-10-CM | POA: Diagnosis not present

## 2023-09-21 DIAGNOSIS — R4189 Other symptoms and signs involving cognitive functions and awareness: Secondary | ICD-10-CM

## 2023-09-21 DIAGNOSIS — G20A1 Parkinson's disease without dyskinesia, without mention of fluctuations: Secondary | ICD-10-CM

## 2023-09-21 NOTE — Progress Notes (Signed)
   Psychometrician Note   Cognitive testing was administered to [name] by Lonell Jude, B.S. (psychometrist) under the supervision of Dr. Renda Beckwith, Psy.D., licensed psychologist on 09/21/2023. Mr. Yurchak did not appear overtly distressed by the testing session per behavioral observation or responses across self-report questionnaires. Rest breaks were offered.    The battery of tests administered was selected by Dr. Renda Beckwith, Psy.D. with consideration to Mr. Dutch current level of functioning, the nature of his symptoms, emotional and behavioral responses during interview, level of literacy, observed level of motivation/effort, and the nature of the referral question. This battery was communicated to the psychometrist. Communication between Dr. Renda Beckwith, Psy.D. and the psychometrist was ongoing throughout the evaluation and Dr. Renda Beckwith, Psy.D. was immediately accessible at all times. Dr. Renda Beckwith, Psy.D. provided supervision to the psychometrist on the date of this service to the extent necessary to assure the quality of all services provided.    [name] will return within approximately 1-2 weeks for an interactive feedback session with Dr. Beckwith at which time his test performances, clinical impressions, and treatment recommendations will be reviewed in detail. Mr. Kranz understands he can contact our office should he require our assistance before this time.  A total of 125 minutes of billable time were spent face-to-face with Mr. Pinkerton by the psychometrist. This includes both test administration and scoring time. Billing for these services is reflected in the clinical report generated by Dr. Renda Beckwith, Psy.D.  This note reflects time spent with the psychometrician and does not include test scores or any clinical interpretations made by Dr. Beckwith. The full report will follow in a separate note.

## 2023-09-21 NOTE — Therapy (Signed)
 OUTPATIENT PHYSICAL THERAPY NEURO TREATMENT    Patient Name: Patrick Ross MRN: [id number] DOB:[birth year], 78 y.o., male Today's Date: 09/22/2023   PCP: Millicent Sharper MD REFERRING PROVIDER: Evonnie Asberry RAMAN DO  END OF SESSION:  PT End of Session - 09/22/23 1450     Visit Number 13    Date for PT Re-Evaluation 09/28/23    Authorization Type Medicare    Progress Note Due on Visit 20    PT Start Time 1455    PT Stop Time 1540    PT Time Calculation (min) 45 min    Activity Tolerance Patient tolerated treatment well    Behavior During Therapy WFL for tasks assessed/performed                      Past Medical History:  Diagnosis Date   Depression    DVT (deep venous thrombosis) (HCC)    GERD (gastroesophageal reflux disease)    History of kidney stones    passed   Hypertension    Hypothyroidism    Lumbar stenosis    OSA (obstructive sleep apnea)    wears cpap   Parkinson's disease (HCC)    PONV (postoperative nausea and vomiting)    REM behavioral disorder    Urinary incontinence    Past Surgical History:  Procedure Laterality Date   BACK SURGERY     x 2 - not fusion   BIOPSY  06/01/2022   Procedure: BIOPSY;  Surgeon: Dianna Specking, MD;  Location: [facility];  Service: Gastroenterology;;   COLONOSCOPY     DEEP BRAIN STIMULATOR PLACEMENT     left 12/27/2012, right 12/10/2011, left revision 02/02/2013, replacement bilateral 11/21/2015   ESOPHAGOGASTRODUODENOSCOPY (EGD) WITH PROPOFOL  N/A 06/01/2022   Procedure: ESOPHAGOGASTRODUODENOSCOPY (EGD) WITH PROPOFOL ;  Surgeon: Dianna Specking, MD;  Location: [facility];  Service: Gastroenterology;  Laterality: N/A;   MINOR PLACEMENT OF FIDUCIAL N/A 01/09/2019   Procedure: Fiducial placement;  Surgeon: Unice Pac, MD;  Location: [facility] OR;  Service: Neurosurgery;  Laterality: N/A;  Fiducial placement   PARTIAL NEPHRECTOMY Right 2005 ish   PULSE GENERATOR IMPLANT Right 01/23/2019   Procedure: Right chest  implantable pulse generator change;  Surgeon: Unice Pac, MD;  Location: [facility] OR;  Service: Neurosurgery;  Laterality: Right;   SPINAL FUSION     lumbar Fusion x 2   SUBTHALAMIC STIMULATOR BATTERY REPLACEMENT Left 02/02/2018   Procedure: Change implantable pulse generator battery, Left chest;  Surgeon: Unice Pac, MD;  Location: [facility] OR;  Service: Neurosurgery;  Laterality: Left;   SUBTHALAMIC STIMULATOR BATTERY REPLACEMENT N/A 01/22/2020   Procedure: REMOVAL OF LEFT BRAIN DEEP BRAIN STIMULATOR ELECTRODE, EXTENSION, AND PULSE GENERATOR;  Surgeon: Unice Pac, MD;  Location: [facility] OR;  Service: Neurosurgery;  Laterality: N/A;   SUBTHALAMIC STIMULATOR BATTERY REPLACEMENT Right 02/23/2022   Procedure: CHANGE IPG BATTERY, RT CHEST;  Surgeon: Dawley, Lani JAYSON, DO;  Location: [facility] OR;  Service: Neurosurgery;  Laterality: Right;   SUBTHALAMIC STIMULATOR INSERTION Left 01/16/2019   Procedure: Left deep brain stimulator electrode revision.;  Surgeon: Unice Pac, MD;  Location: [facility] OR;  Service: Neurosurgery;  Laterality: Left;   SUBTHALAMIC STIMULATOR INSERTION Left 01/23/2019   Procedure: Repositioning of Left Deep Brain Stimulator Electrode;  Surgeon: Unice Pac, MD;  Location: [facility] OR;  Service: Neurosurgery;  Laterality: Left;   TONSILLECTOMY     Patient Active Problem List   Diagnosis Date Noted   Melena 06/01/2022   Acute cystitis without hematuria 05/31/2022   Upper GI  bleed 05/31/2022   Generalized weakness 05/30/2022   Abdominal pain 05/30/2022   OSA (obstructive sleep apnea) 05/30/2022   Hypothyroidism 05/30/2022   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) 12/14/2021   Mixed hyperlipidemia 12/14/2021   Essential hypertension 12/14/2021   Palpitations 12/14/2021   Angina pectoris (HCC) 12/14/2021   Urge incontinence 01/15/2021   History of UTI 01/15/2021   Abnormal urine findings 01/15/2021   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   History of nephrolithiasis 01/15/2021    DVT, lower extremity, proximal, acute, right (HCC) 02/26/2020   Open scalp wound 01/22/2020   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HCC)    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   Parkinson's disease (HCC) 01/16/2018   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) 01/16/2018   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    ONSET DATE: exacerbation 06/21/23, Parkinson's diagnosis 2010  REFERRING DIAG: parkinsons disease with fluctuating dyskinesia  THERAPY DIAG:  Other lack of coordination  Difficulty in walking, not elsewhere classified  Parkinson's disease with dyskinesia, with fluctuations (HCC)  Other abnormalities of gait and mobility  Other symptoms and signs involving the nervous system  Rationale for Evaluation and Treatment: Rehabilitation  SUBJECTIVE:                                                                                                                                                                                             SUBJECTIVE STATEMENT: I am doing okay. When I fell last week, that was scary for me.    EVAL-Pt and his wife report long history of parkinson's,  biggest difficulty with his mobility that they note is R lower back  pain and labored supine to sit movement, also poor foot clearance.  He doesn't like relying on the walker Pt accompanied by: significant other  PERTINENT HISTORY: parkinson's diagnosed greater than 10 yrs ago, deep brain stimulator R, had in L as well but had to be removed for various reasons.  walks around neighborhood 3 x week with aide Tuesdays and Thursdays parkisons bicycle class 2 x Drue training for upper body work out Referred to PT and ST by neurologist   PAIN:  Are you having pain? R lower back pain constant  PRECAUTIONS: Fall and Other: deep brain stimulator on R   RED FLAGS: None   WEIGHT BEARING RESTRICTIONS: No  FALLS: Has patient fallen in last 6 months? No  LIVING ENVIRONMENT: Lives with: lives with their spouse Lives in:  House/apartment Stairs: has internal steps, but has had home renovated and adapted so the he and his wife live in downstairs apartment, all one level with bathroom and kitchen Has following equipment at home: Single  point cane, Walker - 4 wheeled, Shower bench, Grab bars, and Ramped entry  PLOF: Independent  PATIENT GOALS: improve bed mobility and improve gait, stability, avoid falls  OBJECTIVE:  Note: Objective measures were completed at Evaluation unless otherwise noted.  DIAGNOSTIC FINDINGS: see neuro notes, has deep brain stimulator R   COGNITION: Overall cognitive status: Within functional limits for tasks assessed   SENSATION: WFL  COORDINATION: Slowed movements B and noted curling R hand and R foot at times, with gait festinating on turns   EDEMA:  None noted  MUSCLE TONE: noted curling R fingers, toes, knee flex and hip flex at times, randomly during evaluation  MUSCLE LENGTH: Hamstrings: Right -30 deg; Left -30 deg Thomas test: Right nt deg; Left nt deg    POSTURE: Standing with some forward lean, weight on forefeet, L calf atrophied pt and wife report chronic after L lumbar surgery.     LOWER EXTREMITY ROM:   grossly wfl, hips not assessed as not positioned on mat today   LOWER EXTREMITY MMT:    MMT Right Eval Left Eval  Hip flexion 4 4-  Hip extension    Hip abduction    Hip adduction    Hip internal rotation    Hip external rotation    Knee flexion 4 4  Knee extension 4 4-  Ankle dorsiflexion 4 3+  Ankle plantarflexion 4- 3-  Ankle inversion    Ankle eversion    (Blank rows = not tested)  BED MOBILITY:  Not tested  TRANSFERS: I but slowed, uses hands on seat or with rollator  GAIT: Findings: Gait Characteristics: step to pattern, decreased step length- Right, decreased step length- Left, decreased stride length, decreased hip/knee flexion- Right, decreased hip/knee flexion- Left, shuffling, festinating, trunk flexed, and narrow BOS,  Distance walked: 100', Assistive device utilized:Walker - 4 wheeled, Level of assistance: SBA, and Comments: festinating noted on turns, tends to turn L  FUNCTIONAL TESTS:  30 seconds chair stand test 6 reps Timed up and go (TUG): 33.39 with rollator Standing static feet together 15 sec Standing semi tandem 10 sec each leg, needed CGA with L foot forward Standing marching, required B hand hold support to maintain balance                                                                                                                              TREATMENT DATE: 09/22/23 NuStep L5x69mins  OHP alternating 2# 2x10 Arm swings 2# 2x10 STS yellow ball chest press 2x10 Leg ext 10# 3x10 HS curls 25# 3x10 Ball slams with blue ball 2x10   09/19/23:  Prone for ant hip/trunk stretch , therapist assisting with alt knee flexion, R very stiff Prone with roll under ankles, for TKE's 5 sec holds, 10 x 2 Standing in front of mat alt arm swings high amplitude 2# wts x 15 reps each Standing for medicine ball bounces to floor with 2 KG yellow mediball mass practice  Sit to stand from hi low table with yellow mediball 10x Nustep L 5 Ue's and LE's x 6 min  08/25/23 NuStep L5x24mins  Leg ext 10# 2x10 HS curls 25# 2x10 Fitter pushes 1 blue and 1 black band 2x10 STS 2x10 Arm swings holding 1 and 2# WaTe 20s x2    08/23/23 Recheck goals - 5xSTS - TUG - MMT NuStep L5x51mins  Modified sit ups holding yellow 2x10 Box tap 6 Step ups 6 Balance on airex feet together, eyes open 220s and then closed 10   08/18/23 Leg ext 10# 2x10 HS curls 25# 2x10 OHP press yellow ball 2x10 Supine LE stretching- HS, LTR, SKTC  NuStep L5 x40mins    08/16/23 Holding big red ball tapping floor to overhead to side to side x5 Standing shoulder rows and ext green 2x10 Standing shoulder flexion with 2# WaTE 2x10 Box taps 4 holding on and then without walker CGA-minA  NuStep L5x42mins STS 2x10   08/11/23 NuStep  L5x80mins  Fitter pushes 2 blue bands 2x10  Side steps over obstacles in bars  On airex reaching for numbers  Isometric ab push downs 2x10   08/09/23 NuStep L5x67mins  STS with chest press red ball 2x8 Standing march 3#  3# hip abd 2x10  Horizontal abd green 2x10 Leg ext 10# 2x10 HS curls 25# 2x10    08/04/23 30s chair test TUG 27s  MMT 4/5 Standing shoulder ext and rows green band 2x10 Standing shoulder flexion 2# WaTE x10 - stopped due to feeling dizzy and off balance  NuStep L5 x32mins  Seated rotation with yellow ball 2x10   08/02/23 NuStep L5x68mins  Side steps over obstacles in bars Forwards steps over obstacles in bars Calf raises x10 Box taps 4 1HRA Step ups 4 2HRA  STS with OHP 2x5  07/28/23 Supine HS stretch and SKTC  Lower trunk rotations x10 SLR 2x10 Feet on pball rotations and knees to chest AB isometric holds x10  NuStep L5x78mins  STS 3x5   07/27/23 NuStep L5x42mins  LAQ 2# 2x10 HS curls green 2x10  Hip abduction green 2x10 STS 2x10  Stretching with pball rotations and flexion x5     07/06/23 Evaluation, advised to attempt prone lying on bed 10 to 15 min  to stretch body into extension( pt doesn't ever lie prone)   PATIENT EDUCATION: Education details: POC, goals Person educated: Patient and Spouse Education method: Explanation, Demonstration, Tactile cues, and Verbal cues Education comprehension: verbalized understanding, verbal cues required, and needs further education  HOME EXERCISE PROGRAM: Access Code: [email or web address] URL: [email or web address] Date: 08/02/2023 Prepared by: Almetta Fam  Exercises - Supine Hamstring Stretch with Strap  - 2 x daily - 7 x weekly - 2 reps - 15 hold - Modified Thomas Stretch  - 2 x daily - 7 x weekly - 2 reps - 15 hold - Supine Bridge  - 1 x daily - 7 x weekly - 2 sets - 10 reps - Standing Shoulder Horizontal Abduction with Resistance  - 1 x daily - 7 x weekly - 2 sets - 10 reps  GOALS: Goals  reviewed with patient? Yes  SHORT TERM GOALS: Target date: 4 weeks, 08/03/23  I home program for posture and for hamstring, hip flexor stretchig Baseline: Goal status: ongoing 08/02/23, ongoing 08/23/23  LONG TERM GOALS: Target date: 09/28/23: 12 weeks   30 sec sit to stand improve from 6 reps to 12 reps for improved coordination, strength, endurance LEs Baseline:  Goal status: 7 reps ongoing  08/04/23, 8 reps 08/23/23  2.  I and pain free supine to sit motion , rolling to R Baseline: very painful per pt and wife history, not specifically assessed in clinic today Goal status: ongoing 07/28/23, ongoing 08/09/23, still has pain with bed mobility 08/23/23  3.  TUG score improve from 33 sec to 18 or less for reduced fall risk   Baseline:  Goal status: 27s ongoing 08/04/23, 22s 08/23/23  4.  Improve strength LE's for B quads, hamstrings to 4+5 for improved functional strength Baseline: 4- Goal status: 4/5 08/04/23, 5/5 MET 08/23/23   ASSESSMENT:  CLINICAL IMPRESSION: Patient doing well, trying to be more safe and avoid another fall. Continued to challenge movement patterns and stability.  Some fatigue with exercises today. Will continue to progress to improve his overall function.    EVAL Patient is a 78 y.o. male who was evaluated today by physical therapy due to referral from his neurologist, with decline in function, particularly control of R extremities, with his Parkinson's diagnosis.  He and his wife have been proactive about maintaining his function, he attends a training/ strengthening session locally 2 x week , attends parkinson's cycling weekly, and walks around 0.6 miles with his aide 3 x week outdoors with his rollator.  They recently have adapted their home with a main floor renovation so that their bedroom, bath, kitchen are all on one level.   Main problems identified today are weakness B LE's, L ankle is weak chronically due to lumbar radiculopathy and B knee, hips musculature is weak.  He  also has coordination deficits with festinating gait particularly on turns and static / dynamic balance deficits.  Has chronic R sided lower back pain which is particularly intense with moving supine to sit.  He should benefit from physical therapy intervention to address his deficits and update his current routine for stretching and strengthening at home.    OBJECTIVE IMPAIRMENTS: Abnormal gait, decreased activity tolerance, decreased balance, decreased coordination, decreased endurance, decreased mobility, difficulty walking, decreased strength, hypomobility, impaired perceived functional ability, impaired UE functional use, postural dysfunction, and pain.   ACTIVITY LIMITATIONS: carrying, lifting, squatting, stairs, transfers, and locomotion level  PARTICIPATION LIMITATIONS: meal prep, cleaning, laundry, shopping, community activity, and church  PERSONAL FACTORS: Age, Behavior pattern, Fitness, Past/current experiences, Time since onset of injury/illness/exacerbation, and 1-2 comorbidities: h/o L lumbar radiculopathy and weakness L LE, chronic R sided lower back pain, are also affecting patient's functional outcome.   REHAB POTENTIAL: Good  CLINICAL DECISION MAKING: Evolving/moderate complexity  EVALUATION COMPLEXITY: Moderate  PLAN:  PT FREQUENCY: 2x/week  PT DURATION: 12 weeks  PLANNED INTERVENTIONS: 97110-Therapeutic exercises, 97530- Therapeutic activity, [id number]- Neuromuscular re-education, 97535- Self Care, and [id number]- Manual therapy  PLAN FOR NEXT SESSION: continue with balance, stretching, coordination activites   Almetta Fam, PT, DPT, OCS 09/22/2023, 3:37 PM

## 2023-09-22 ENCOUNTER — Ambulatory Visit

## 2023-09-22 DIAGNOSIS — R2689 Other abnormalities of gait and mobility: Secondary | ICD-10-CM

## 2023-09-22 DIAGNOSIS — R278 Other lack of coordination: Secondary | ICD-10-CM

## 2023-09-22 DIAGNOSIS — R262 Difficulty in walking, not elsewhere classified: Secondary | ICD-10-CM

## 2023-09-22 DIAGNOSIS — R29818 Other symptoms and signs involving the nervous system: Secondary | ICD-10-CM

## 2023-09-22 DIAGNOSIS — G20B2 Parkinson's disease with dyskinesia, with fluctuations: Secondary | ICD-10-CM

## 2023-09-22 NOTE — Progress Notes (Unsigned)
 NEUROPSYCHOLOGICAL EVALUATION [place]. [facility]  [place] Department of Neurology  Date of Evaluation: 09/21/2023  REASON FOR REFERRAL   Patrick Ross is a 78 year old, right-handed, White male with 18 years of formal education. He was referred for neuropsychological evaluation by his neurologist, Asberry Tat, D.O., to assess current neurocognitive functioning, document potential cognitive deficits, and assist with treatment planning in setting of diagnosed Parkinson's disease. He previously underwent neuropsychological evaluation with Raguel Ares, Ph.D., at [facility] in 2013, 2014, and 2015. He then had a repeat exam with Arthea Maryland, Ph.D., at [facility] on 11/17/2018. Only a summary of the initial three evaluations is available for review; however, the repeat performance from 2020 can serve as an updated baseline for comparison to today's evaluation.  SUMMARY OF RESULTS   Premorbid cognitive abilities are estimated to be in the average range based on word reading and sociodemographic factors. Relative to this baseline estimate, performance today was variable across most domains.  Specifically, he performed within normal limits on a basic auditory working memory task but had difficulty when task demands increased. Most processing speed measures were below expectations, though rapid counting and word reading remained intact. Executive functioning was broadly impaired, except for intact verbal abstract reasoning. Verbal fluency was low, while confrontation naming was preserved. Visuospatial abilities were generally within expectations.  Learning/memory performance was similarly variable. He adequately encoded, recalled, and recognized verbal information (i.e., word list, short stories). In comparison his immediate and delayed recall of visual designs was below expectations, while recognition was relatively preserved.  Comparison to the assessment  in 2020 indicated declines across several tests. The most prominent decline was observed in executive functioning, with significantly reduced performance across all repeated tasks. The only exception was normal performance on a verbal abstract reasoning measure that was newly introduced in the current evaluation. Decline was also observed on select processing speed measures--including rapid color naming, rapid word reading, and a timed visual puzzles task--as well as on a measure of phonemic fluency. Of note, although the decline did not reach a full standard deviation or greater, a relative downward trend was observed on tasks of more complex working memory, speeded symbol decoding, and semantic fluency. Despite performance remaining globally within the normal range today on verbal learning/memory measures, scores still reflected a subtle to modest decline in both immediate and delayed recall compared to the prior assessment. However, recognition on the word list task improved, and he made no intrusive or repetitive errors, unlike the handful of errors seen previously. Story recognition remained stable. For visual information, a similar decline was observed in immediate and delayed recall, although recognition was grossly preserved.  DIAGNOSTIC IMPRESSION   Results of the current evaluation indicated deficits in executive functioning, processing speed, complex working memory, and aspects of learning/memory. Compared to the 2020 assessment, notable declines were observed across several tasks, with the most prominent decline in executive functioning, followed by declines in memory--particularly in encoding and retrieval--and processing speed. 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 Etiology appears most consistent with known Parkinson's disease. Additional contributing factors to the overall clinical picture may include cerebrovascular changes  and intermittent mood symptoms. At this time, his profile does not strongly suggest a coexisting Alzheimer's disease process. Results serve as an updated baseline for future comparison, should it ever become necessary to re-evaluate the patient.   ICD-10 Codes: G20.A1 Parkinson's disease; F02.A18 Mild dementia  RECOMMENDATIONS  In consultation with your doctor, schedule cognitive reevaluation on an as-needed basis to assess for cognitive decline and update treatment recommendations. Reevaluation should occur during a period of medical and affective stability.  Behavioral concerns were discussed with Dr. Evonnie. She will be contacting you soon regarding possible medication changes.  A trusted family member is encouraged to provide informal oversight of medications, finances, and medical appointments. As long as the patient manages these areas without significant errors, continued independence is appropriate. Should concerns arise, increased family involvement may be warranted to ensure safety and accuracy.  Patient is to be commended for consistent participation in physical, occupational, and speech therapy sessions. Ongoing engagement demonstrates a strong commitment to maintaining function and maximizing quality of life.  Continue managing vascular risk factors through a heart healthy diet (e.g., MIND, Mediterranean), physician-approved physical activity, and medication adherence.   Aim to regularly participate in activities that you find enjoyable and fulfilling, whether that be hobbies, socializing with loved ones, or being outdoors. This can improve mood, increase motivation, and offer cognitive stimulation.   Consider implementing compensatory strategies to maximize independence and maintain daily functioning. Examples include:   -Adhere to routine. Compensatory strategies work best when they are used consistently. Use a planner, calendar, or white board that has the schedule and important  events for the day clearly listed to reference and cross off when tasks are complete.   -Ask for written information, especially if it is new or unfamiliar (e.g., information provided at a doctor's appointment).   -Create an organized environment. Keep items that can be easily misplaced in a sensible location and get into the habit of always returning the items to those places.   -Pay attention and reduce distractions. Make a point of focusing attention on information you want to remember. One-on-one interaction is more likely to facilitate attention and minimize distraction. Make eye contact and repeat the information out loud after you hear it. Reduce interruptions or distractions especially when attempting to learn new information.   -Create associations. When learning something new, think about and understand the information. Explain it in your own words or try to associate it with something you already know. Take notes to help remember important details.  -Evaluate goals and plan accordingly. When confronted by many different tasks, begin by making a list that prioritizes each task and estimates the time it will take to complete. Break down complicated tasks into smaller, more manageable steps.   -Focus on one task at a time and complete each task before starting another. Avoid multitasking.  DISPOSITION   Patient will follow up with the referring provider, Dr. Evonnie. No follow-up neuropsychological testing was scheduled at this time. Please feel free to refer the patient for repeated evaluation if he shows a significant change in neurocognitive status. He and his wife will be provided verbal feedback in approximately one week regarding the findings and impression during this visit.  The remainder of the report includes the details of the patient's background and a table of results from the current evaluation, which support the summary and recommendations described above.  BACKGROUND    History of Presenting Illness: The following information was obtained from a review of medical records and an interview with the patient and his wife, Marval. Patient has been under the care of Dr. Evonnie at [facility] since 2019 for the management of Parkinson's disease. He underwent right STN DBS on 12/10/2011 and left STN DBS on 12/27/2012. The left lead repeatedly displaced despite multiple  revisions and was ultimately removed after the patient hit his head on the corner of a wall, developing an abrasion over the lead that led to a wound infection. Please refer to the medical record for a more detailed account of these events. During the most recent neurology visit with Dr. Evonnie on 06/21/2023, concerns were raised about a decline in cognition, with suspicion of mild dementia. As a result, neuropsychological reevaluation was recommended.  Previous Neuropsychological Evaluations:  Myriam Sollman, Ph.D., at [facility] (11/11/2011, 12/26/2012, 02/12/2014): Patient completed an initial neuropsychological evaluation to assist in determining his DBS candidacy. Overall, Dr. Manny described him as having a very strong cognitive profile with subtle, relative weaknesses in verbal fluency. Overall, concerns regarding him undergoing DBS surgical procedures were not reported. Mr. Lemen was re-evaluated by Dr. Manny in 2014, with results suggesting generally high average scores across the board with scores unchanged following right STN DBS placement. Finally, he was evaluated for a third time by Dr. Sollman in 2015. Results suggested a WNL/strong cognitive status profile, with some variability across all evaluations. Verbal fluency and metal flexibility were said to exhibit a mild decline relative to the previous evaluation.   Arthea Maryland, Ph.D., at [facility] (11/17/2018): Briefly, results suggested mild subcortical/frontal subcortical dysfunction as evidenced by weaknesses  across processing speed and certain aspects of executive functioning (i.e., cognitive flexibility and working memory). This pattern of cognitive performance is common in individuals diagnosed with Parkinson's disease, which represents the likely etiology of these deficits. Relative to his previous evaluations, scores did appear somewhat suppressed across several cognitive domains, including processing speed, working memory, cognitive flexibility, semantic fluency, and verbal and visual learning and memory. However, outside of changes across the former 3 domains, exhibited declines were generally mild and current scores remained within appropriate normative ranges given premorbid intellectual estimates.    Cognitive Functioning: During today's appointment, the patient and his wife reported a subtle decline in cognitive functioning since his prior neuropsychological evaluation five years ago. Specifically, the patient noted decreased focus/attention, as well as impaired depth perception. He also described slower processing speed and occasional word-finding difficulties. While he may forget faces and names, he generally denied problems with recalling conversations or misplacing items. Additionally, he denied difficulties with comprehension, navigation, and executive functioning. His wife largely corroborated his report. She acknowledged a mild decline in his short-term memory, noting that he occasionally does not remember conversations--though she emphasized this is infrequent--and sometimes forgets passwords when using the computer. Furthermore, she noted that his executive functions, including planning, organizing, and problem-solving, "are not what they used to be."  Physical Functioning: Patient denied difficulties with sleep initiation or maintenance and has not recently experienced episodes of dream enactment behavior. Appetite remains stable. He generally does not notice significant swallowing problems  unless eating dry food. 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 Vision is largely stable, although he reports dry eyes and occasional blurring. He has a history of diminished hearing, attributed to prolonged exposure to loud machinery. He continues to experience balance and gait problems, with ongoing falls--the most recent occurring last Friday. He denies tremors. He remains regularly engaged in physical therapy, occupational therapy, and speech therapy.  Emotional Functioning: Patient described his recent mood as generally good, although he admitted to feeling anxious about today's appointment. He denied suicidal ideation. Of note, the patient's wife described a few recent instances of inappropriate behavior that had not occurred for  some time. She recalled similar behavior in the past while the patient was on certain medications, but it had not been an issue again until now.  Imaging: MRI of the brain (05/21/2019) documented a few scattered foci of T2 hyperintensity within the white matter, most likely related to chronic small vessel ischemia.  Other Relevant Medical History: Remarkable for hypertension, hyperlipidemia, angina pectoris, orthostatic hypotension, h/o right lower extremity deep vein thrombosis, obstructive sleep apnea, and hypothyroidism. Please refer to the medical record for a more comprehensive problem list. No history of stroke, CNS infection, or seizure was reported. Of note, it was documented in the previous neuropsychological evaluation that the patient was hit in the head with a chunk of iron in the 1980s while working. He denied experiencing a loss in consciousness, but did experience symptoms of disorientation following his impact. He was not hospitalized and returned work shortly afterwards. Persisting post-concussion symptoms were denied.  Current Medications/Vitamins/Supplements: Per record, amantadine , ascorbic acid,  carbidopa -levodopa , clonazepam , cyclosporine, escitalopram , levodopa , levothyroxine , lisinopril , multiple vitamins-minerals, omega-3 fatty acids, omeprazole  probiotic-prebiotic, rosuvastatin , turmeric, and vitamin D3.  Functional Status: Patient stopped driving approximately 2-3 years ago at his wife's recommendation. She had noticed occasional drifting within lanes and had some concerns about his reaction time, although there had been no recent accidents or traffic violations. He continues to manage the finances without reported errors. His wife manages his medications and notes that she has done so for some time. Patient occasionally turns off his reminder alarm and forgets to take a dose. He is independent with most basic activities of daily living, though due to motor difficulties, his wife sometimes assists him with dressing, particularly with putting on pants. Additionally, an aide assists three times per week to help with bathing, given balance issues and history of falls. He denied any difficulty operating household appliances.  Family Neurological History: Unremarkable.  Psychiatric History: Remarkable for mood symptoms, currently managed with medication, although the patient denied formal diagnosis of depression or anxiety. He also denied any history of recent counseling, suicidal ideation, recent hallucinations, and psychiatric hospitalizations.  Substance Use History: Patient denied current use of alcohol, nicotine, marijuana, and illicit substances.  Social and Developmental History: Patient was born in [place], [place]. History of perinatal complications and developmental delays was not reported. Patient is married with four children. He currently resides with his wife, daughter, and granddaughter. He has another granddaughter that intermittently stays with them as well.   Educational and Occupational History: No history of childhood learning disability or special education services was reported.  Patient repeated the first grade due to early placement in school. 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 He earned a bachelor's degree in Management consultant and a master's degree in [place] education. He previously served in the [employer] for 20 years. 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  BEHAVIORAL OBSERVATIONS   Patient arrived on time and was accompanied by his wife, Marval. He ambulated slowly and with a walker. He was alert and fully oriented. He was appropriately groomed and dressed for the setting. No significant sensory or motor abnormalities were observed. Vision (with glasses) and hearing (with bilateral hearing aids) were adequate for testing purposes. Speech was hypophonic. Response latency was delayed. No conversational word-finding difficulties, paraphasic errors, or dysarthria were observed. Comprehension was conversationally intact. Thought processes were linear, logical, and coherent. Thought content was organized and devoid of delusions. Insight appeared appropriate. Affect was even and  congruent with mood. He was cooperative and gave adequate effort during testing, including on embedded measures of performance validity. Results are thought to accurately reflect his cognitive functioning at this time.  NEUROPSYCHOLOGICAL TESTING RESULTS   Tests Administered: Animal Naming Test; Brief Visuospatial Memory Test-Revised (BVMT-R) - Form 1; [place]  Verbal Learning Test Third Edition (CVLT3) - Brief Form; Controlled Oral Word Association Test (COWAT): FAS; Delis-Kaplan Executive Function System (D-KEFS) - Subtest(s): Color-Word Interference Test; Geriatric Anxiety Scale-10 Item (GAS-10); Geriatric Depression Scale Short Form (GDS-SF); Neuropsychological Assessment Battery (NAB) Form 1 - Subtest(s): Naming, Digits Forward, Digits Backwards, Visual Discrimination; Trail Making Test (TMT); Symbol Digit Modalities Test (SDMT);  Wechsler Adult Intelligence Scale Fifth Edition (WAIS-5) - Subtest(s): Similarities, Matrix Reasoning, Visual Puzzles, Naming Speed Quantity; and Wechsler Memory Scale Fourth Edition (WMS-IV) - Subtest(s): Logical Memory (LM).  Test results are provided in the table below. Whenever possible, the patient's scores were compared against age-, sex-, and education-corrected normative samples. Interpretive descriptions are based on the AACN consensus conference statement on uniform labeling (Guilmette et al., 2020).   11/17/2018 09/21/2023    PREMORBID FUNCTIONING  RAW  RANGE  TOPF StdS=102 -- -- (Average)  ATTENTION & WORKING MEMORY  RAW  RANGE  NAB Digits Forward T=49 -- T=50 Average  NAB Digits Backward T=40 -- T=33 Below Average  PROCESSING SPEED  RAW  RANGE  Trails A 65''0e, T=28 66''0e T=30 Below Average  SDMT Oral 31, T=34 19 T=25 Exceptionally Low  WAIS-5 Naming Speed Quantity -- -- ss=7 Low Average  DKEFS CWIT Color Naming 32''0e, ss=9 51''0e ss=3 Exceptionally Low  DKEFS CWIT Word Reading 22''0e, ss=12 32''0e ss=7 Low Average  EXECUTIVE FUNCTION  RAW  RANGE  Trails B 135''1e, T=35 303''0e T=15 Exceptionally Low  WAIS-IV/WAIS-5 Matrix Reasoning ss=10 -- ss=5 Below Average  WAIS-5 Similarities -- -- ss=11 Average  COWAT Letter Fluency (14+13+13), T=46 3+5+4 T=18 Exceptionally Low  DKEFS CWIT Inhibition 84''3e, ss=8 104''3e ss=5 Below Average  DKEFS CWIT Inhibition/Switching 83''1e, ss=9 170''7e ss=1 Exceptionally Low  LANGUAGE  RAW  RANGE  COWAT Letter Fluency (14+13+13), T=46 3+5+4 T=18 Exceptionally Low  Animal Naming Test 15, T=40 12 T=33 Below Average  NAB Naming Test 31/31, T=57 31/31 T=59 WNL  VISUOSPATIAL  RAW  RANGE  NAB Visual Discrimination T=41 -- T=47 Average  WAIS-IV/WAIS-IV Visual Puzzles ss=9 -- ss=6 Low Average  BVMT-R Copy Trial -- 12/12 -- WNL  VERBAL LEARNING & MEMORY  RAW  RANGE  CVLT3 Total 1-4 (3+7+7+8)/36, StdS=95 (4+5+7+6)/36 StdS=87 Low Average  CVLT3 SDFR   7/9, ss=10 6/9 ss=8 Average  CVLT3 LDFR  7/9, ss=11 5/9 ss=7 Low Average  CVLT3 LDCR  7/9, ss=11 6/9 ss=8 Average  CVLT3 Recognition Hits 7, ss=7 9 ss=13 High Average  CVLT3 Recognition False+ 3, ss=5 1 ss=9 Average  CVLT3 Discriminability ss=5 -- ss=11 Average  CVLT3 Intrusions 5, ss=6 0 ss=12 High Average  CVLT3 Repetitions 4, ss=7 0 ss=14 High Average  CVLT3 Forced Choice 9/9 9/9 -- WNL  WMS-IV LM-I  (9+10+16)/53, ss=11 (5+12+10)/53 ss=9 Average  WMS-IV LM-II  (10+15)/39, ss=13 (10+9)/39 ss=11 Average  WMS-IV LM Recognition  (8+13)/23, >75%ile (8+12)/23 >75%ile High Average to Exceptionally High  VISUAL LEARNING & MEMORY  RAW  RANGE  BVMT-R Total Recall (4+8+9)/36, T=50 (3+3+6)/36 T=36 Below Average  BVMT-R Delayed Recall 8/12, T=50 3/12 T=31 Below Average  BVMT-R Percent Retained 89, >16%ile 50 3-5%ile Below Average  BVMT-R Recognition Hits 6, >16%ile 6 >16%ile WNL  BVMT-R Recognition False Alarms 1, 11-16%ile 2 <  1%ile Exceptionally Low  BVMT-R RDI 5, >16%ile 4 11-16%ile Low Average  QUESTIONNAIRES  RAW  RANGE  GDS-SF -- 1 -- Minimal  GAS-10 -- 3 -- Minimal  *Note: ss = scaled score; StdS = standard score; T = t-score; C/S = corrected raw score; WNL = within normal limits; BNL= below normal limits; D/C = discontinued. Scores from skewed distributions are typically interpreted as WNL (>=16th %ile) or BNL (<16th %ile).   INFORMED CONSENT   Patient was provided with a verbal description of the nature and purpose of the neuropsychological evaluation. Also reviewed were the foreseeable risks and/or discomforts and benefits of the procedure, limits of confidentiality, and mandatory reporting requirements of this provider. Patient was given the opportunity to have their questions answered. Oral consent to participate was provided by the patient.   This report was prepared as part of a clinical evaluation and is not intended for forensic use.  SERVICE   This evaluation was conducted by  Renda Beckwith, Psy.D. In addition to time spent directly with the patient, total professional time (180 minutes) includes record review, integration of relevant medical history, test selection, interpretation of findings, and report preparation. A technician, Lonell Jude, B.S., provided testing and scoring assistance for 125 minutes.  Psychiatric Diagnostic Evaluation Services (Professional): 09208 x 1 Neuropsychological Testing Evaluation Services (Professional): [phone] x 1 Neuropsychological Testing Evaluation Services (Professional): 03866 x 2 Neuropsychological Test Administration and Scoring (Technician): [phone] x 1 Neuropsychological Test Administration and Scoring (Technician): [phone]  This report was generated using voice recognition software. While this document has been carefully reviewed, transcription errors may be present. I apologize in advance for any inconvenience. Please contact me if further clarification is needed.            Renda Beckwith, Psy.D.             Neuropsychologist

## 2023-09-23 ENCOUNTER — Telehealth: Payer: Self-pay | Admitting: Neurology

## 2023-09-23 NOTE — Telephone Encounter (Signed)
 Called and spoke to patients wife Marval and patient will D/C Amantadine . She will call us  and let us  know how patient is doing

## 2023-09-23 NOTE — Telephone Encounter (Signed)
 Patient was in yesterday for testing/results with Dr. Gayland.  Patient's wife noted that patient had some inappropriate behaviors.  This included pornography (had apparently been an issue in the past and then had resolved).  More significant was the fact that patient's daughter and granddaughter live with them and he would take off his close when granddaughter was in his presence.  He has also stared at women inappropriately.  Chelsea, please go ahead and stop his amantadine .  You can tell his wife that I talked to Dr. Gayland and I think that is the best course of action.  He is only on a very low-dose of this.  The levodopa  could be driving this as well, but I do not want to stop that or decrease that right now.  He may end up having to go to psychiatry to help with some of this.  For now, to stop the amantadine .  Tell her to give us  some feedback on how he does.

## 2023-09-26 ENCOUNTER — Ambulatory Visit: Admitting: Occupational Therapy

## 2023-09-26 ENCOUNTER — Encounter: Payer: Self-pay | Admitting: Occupational Therapy

## 2023-09-26 ENCOUNTER — Ambulatory Visit

## 2023-09-26 DIAGNOSIS — R278 Other lack of coordination: Secondary | ICD-10-CM

## 2023-09-26 DIAGNOSIS — R29818 Other symptoms and signs involving the nervous system: Secondary | ICD-10-CM

## 2023-09-26 DIAGNOSIS — G20B2 Parkinson's disease with dyskinesia, with fluctuations: Secondary | ICD-10-CM

## 2023-09-26 DIAGNOSIS — R2689 Other abnormalities of gait and mobility: Secondary | ICD-10-CM | POA: Diagnosis not present

## 2023-09-26 DIAGNOSIS — R262 Difficulty in walking, not elsewhere classified: Secondary | ICD-10-CM

## 2023-09-26 DIAGNOSIS — R41840 Attention and concentration deficit: Secondary | ICD-10-CM

## 2023-09-26 DIAGNOSIS — R41844 Frontal lobe and executive function deficit: Secondary | ICD-10-CM

## 2023-09-26 NOTE — Therapy (Signed)
 OUTPATIENT PHYSICAL THERAPY NEURO TREATMENT    Patient Name: Patrick Ross MRN: [id number] DOB:1945/12/21, 78 y.o., male Today's Date: 09/26/2023   PCP: Millicent Sharper MD REFERRING PROVIDER: Evonnie Asberry RAMAN DO  END OF SESSION:  PT End of Session - 09/26/23 1354     Visit Number 14    Date for PT Re-Evaluation 10/31/23    Authorization Type Medicare    Progress Note Due on Visit 20    PT Start Time 1400    PT Stop Time 1445    PT Time Calculation (min) 45 min    Activity Tolerance Patient tolerated treatment well    Behavior During Therapy WFL for tasks assessed/performed                       Past Medical History:  Diagnosis Date   Depression    DVT (deep venous thrombosis) (HCC)    GERD (gastroesophageal reflux disease)    History of kidney stones    passed   Hypertension    Hypothyroidism    Lumbar stenosis    OSA (obstructive sleep apnea)    wears cpap   Parkinson's disease (HCC)    PONV (postoperative nausea and vomiting)    REM behavioral disorder    Urinary incontinence    Past Surgical History:  Procedure Laterality Date   BACK SURGERY     x 2 - not fusion   BIOPSY  06/01/2022   Procedure: BIOPSY;  Surgeon: Dianna Specking, MD;  Location: [facility];  Service: Gastroenterology;;   COLONOSCOPY     DEEP BRAIN STIMULATOR PLACEMENT     left 12/27/2012, right 12/10/2011, left revision 02/02/2013, replacement bilateral 11/21/2015   ESOPHAGOGASTRODUODENOSCOPY (EGD) WITH PROPOFOL  N/A 06/01/2022   Procedure: ESOPHAGOGASTRODUODENOSCOPY (EGD) WITH PROPOFOL ;  Surgeon: Dianna Specking, MD;  Location: [facility];  Service: Gastroenterology;  Laterality: N/A;   MINOR PLACEMENT OF FIDUCIAL N/A 01/09/2019   Procedure: Fiducial placement;  Surgeon: Unice Pac, MD;  Location: [facility] OR;  Service: Neurosurgery;  Laterality: N/A;  Fiducial placement   PARTIAL NEPHRECTOMY Right 2005 ish   PULSE GENERATOR IMPLANT Right 01/23/2019   Procedure: Right chest  implantable pulse generator change;  Surgeon: Unice Pac, MD;  Location: [facility] OR;  Service: Neurosurgery;  Laterality: Right;   SPINAL FUSION     lumbar Fusion x 2   SUBTHALAMIC STIMULATOR BATTERY REPLACEMENT Left 02/02/2018   Procedure: Change implantable pulse generator battery, Left chest;  Surgeon: Unice Pac, MD;  Location: [facility] OR;  Service: Neurosurgery;  Laterality: Left;   SUBTHALAMIC STIMULATOR BATTERY REPLACEMENT N/A 01/22/2020   Procedure: REMOVAL OF LEFT BRAIN DEEP BRAIN STIMULATOR ELECTRODE, EXTENSION, AND PULSE GENERATOR;  Surgeon: Unice Pac, MD;  Location: [facility] OR;  Service: Neurosurgery;  Laterality: N/A;   SUBTHALAMIC STIMULATOR BATTERY REPLACEMENT Right 02/23/2022   Procedure: CHANGE IPG BATTERY, RT CHEST;  Surgeon: Dawley, Lani JAYSON, DO;  Location: [facility] OR;  Service: Neurosurgery;  Laterality: Right;   SUBTHALAMIC STIMULATOR INSERTION Left 01/16/2019   Procedure: Left deep brain stimulator electrode revision.;  Surgeon: Unice Pac, MD;  Location: [facility] OR;  Service: Neurosurgery;  Laterality: Left;   SUBTHALAMIC STIMULATOR INSERTION Left 01/23/2019   Procedure: Repositioning of Left Deep Brain Stimulator Electrode;  Surgeon: Unice Pac, MD;  Location: [facility] OR;  Service: Neurosurgery;  Laterality: Left;   TONSILLECTOMY     Patient Active Problem List   Diagnosis Date Noted   Melena 06/01/2022   Acute cystitis without hematuria 05/31/2022   Upper  GI bleed 05/31/2022   Generalized weakness 05/30/2022   Abdominal pain 05/30/2022   OSA (obstructive sleep apnea) 05/30/2022   Hypothyroidism 05/30/2022   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) 12/14/2021   Mixed hyperlipidemia 12/14/2021   Essential hypertension 12/14/2021   Palpitations 12/14/2021   Angina pectoris (HCC) 12/14/2021   Urge incontinence 01/15/2021   History of UTI 01/15/2021   Abnormal urine findings 01/15/2021   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   History of nephrolithiasis 01/15/2021    DVT, lower extremity, proximal, acute, right (HCC) 02/26/2020   Open scalp wound 01/22/2020   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HCC)    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   Parkinson's disease (HCC) 01/16/2018   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) 01/16/2018   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    ONSET DATE: exacerbation 06/21/23, Parkinson's diagnosis 2010  REFERRING DIAG: parkinsons disease with fluctuating dyskinesia  THERAPY DIAG:  Other lack of coordination  Difficulty in walking, not elsewhere classified  Parkinson's disease with dyskinesia, with fluctuations (HCC)  Other abnormalities of gait and mobility  Other symptoms and signs involving the nervous system  Rationale for Evaluation and Treatment: Rehabilitation  SUBJECTIVE:                                                                                                                                                                                             SUBJECTIVE STATEMENT: I have been okay. No falls.    EVAL-Pt and his wife report long history of parkinson's,  biggest difficulty with his mobility that they note is R lower back  pain and labored supine to sit movement, also poor foot clearance.  He doesn't like relying on the walker Pt accompanied by: significant other  PERTINENT HISTORY: parkinson's diagnosed greater than 10 yrs ago, deep brain stimulator R, had in L as well but had to be removed for various reasons.  walks around neighborhood 3 x week with aide Tuesdays and Thursdays parkisons bicycle class 2 x Drue training for upper body work out Referred to PT and ST by neurologist   PAIN:  Are you having pain? R lower back pain constant  PRECAUTIONS: Fall and Other: deep brain stimulator on R   RED FLAGS: None   WEIGHT BEARING RESTRICTIONS: No  FALLS: Has patient fallen in last 6 months? No  LIVING ENVIRONMENT: Lives with: lives with their spouse Lives in: House/apartment Stairs: has internal  steps, but has had home renovated and adapted so the he and his wife live in downstairs apartment, all one level with bathroom and kitchen Has following equipment at home: Single point cane, Environmental consultant - 4 wheeled, [employer]  bench, Grab bars, and Ramped entry  PLOF: Independent  PATIENT GOALS: improve bed mobility and improve gait, stability, avoid falls  OBJECTIVE:  Note: Objective measures were completed at Evaluation unless otherwise noted.  DIAGNOSTIC FINDINGS: see neuro notes, has deep brain stimulator R   COGNITION: Overall cognitive status: Within functional limits for tasks assessed   SENSATION: WFL  COORDINATION: Slowed movements B and noted curling R hand and R foot at times, with gait festinating on turns   EDEMA:  None noted  MUSCLE TONE: noted curling R fingers, toes, knee flex and hip flex at times, randomly during evaluation  MUSCLE LENGTH: Hamstrings: Right -30 deg; Left -30 deg Thomas test: Right nt deg; Left nt deg    POSTURE: Standing with some forward lean, weight on forefeet, L calf atrophied pt and wife report chronic after L lumbar surgery.     LOWER EXTREMITY ROM:   grossly wfl, hips not assessed as not positioned on mat today   LOWER EXTREMITY MMT:    MMT Right Eval Left Eval  Hip flexion 4 4-  Hip extension    Hip abduction    Hip adduction    Hip internal rotation    Hip external rotation    Knee flexion 4 4  Knee extension 4 4-  Ankle dorsiflexion 4 3+  Ankle plantarflexion 4- 3-  Ankle inversion    Ankle eversion    (Blank rows = not tested)  BED MOBILITY:  Not tested  TRANSFERS: I but slowed, uses hands on seat or with rollator  GAIT: Findings: Gait Characteristics: step to pattern, decreased step length- Right, decreased step length- Left, decreased stride length, decreased hip/knee flexion- Right, decreased hip/knee flexion- Left, shuffling, festinating, trunk flexed, and narrow BOS, Distance walked: 100', Assistive device  utilized:Walker - 4 wheeled, Level of assistance: SBA, and Comments: festinating noted on turns, tends to turn L  FUNCTIONAL TESTS:  30 seconds chair stand test 6 reps Timed up and go (TUG): 33.39 with rollator Standing static feet together 15 sec Standing semi tandem 10 sec each leg, needed CGA with L foot forward Standing marching, required B hand hold support to maintain balance                                                                                                                              TREATMENT DATE: 09/26/23 Recheck goals -30s chair test 11 reps -TUG 20s  NuStep L5 x71mins  Standing rows and ext in front of mat table 2x10 STS w/ball toss 2x10 2# boxing punches  Standing marches 3# Standing hip abd 3#   09/22/23 NuStep L5x48mins  OHP alternating 2# 2x10 Arm swings 2# 2x10 STS yellow ball chest press 2x10 Leg ext 10# 3x10 HS curls 25# 3x10 Ball slams with blue ball 2x10   09/19/23:  Prone for ant hip/trunk stretch , therapist assisting with alt knee flexion, R very stiff Prone with roll under ankles, for TKE's 5  sec holds, 10 x 2 Standing in front of mat alt arm swings high amplitude 2# wts x 15 reps each Standing for medicine ball bounces to floor with 2 KG yellow mediball mass practice Sit to stand from hi low table with yellow mediball 10x Nustep L 5 Ue's and LE's x 6 min  08/25/23 NuStep L5x71mins  Leg ext 10# 2x10 HS curls 25# 2x10 Fitter pushes 1 blue and 1 black band 2x10 STS 2x10 Arm swings holding 1 and 2# WaTe 20s x2    08/23/23 Recheck goals - 5xSTS - TUG - MMT NuStep L5x26mins  Modified sit ups holding yellow 2x10 Box tap 6 Step ups 6 Balance on airex feet together, eyes open 220s and then closed 10   08/18/23 Leg ext 10# 2x10 HS curls 25# 2x10 OHP press yellow ball 2x10 Supine LE stretching- HS, LTR, SKTC  NuStep L5 x92mins    08/16/23 Holding big red ball tapping floor to overhead to side to side x5 Standing shoulder rows  and ext green 2x10 Standing shoulder flexion with 2# WaTE 2x10 Box taps 4 holding on and then without walker CGA-minA  NuStep L5x3mins STS 2x10   08/11/23 NuStep L5x57mins  Fitter pushes 2 blue bands 2x10  Side steps over obstacles in bars  On airex reaching for numbers  Isometric ab push downs 2x10   08/09/23 NuStep L5x65mins  STS with chest press red ball 2x8 Standing march 3#  3# hip abd 2x10  Horizontal abd green 2x10 Leg ext 10# 2x10 HS curls 25# 2x10    08/04/23 30s chair test TUG 27s  MMT 4/5 Standing shoulder ext and rows green band 2x10 Standing shoulder flexion 2# WaTE x10 - stopped due to feeling dizzy and off balance  NuStep L5 x37mins  Seated rotation with yellow ball 2x10   08/02/23 NuStep L5x31mins  Side steps over obstacles in bars Forwards steps over obstacles in bars Calf raises x10 Box taps 4 1HRA Step ups 4 2HRA  STS with OHP 2x5  07/28/23 Supine HS stretch and SKTC  Lower trunk rotations x10 SLR 2x10 Feet on pball rotations and knees to chest AB isometric holds x10  NuStep L5x41mins  STS 3x5   07/27/23 NuStep L5x86mins  LAQ 2# 2x10 HS curls green 2x10  Hip abduction green 2x10 STS 2x10  Stretching with pball rotations and flexion x5     07/06/23 Evaluation, advised to attempt prone lying on bed 10 to 15 min  to stretch body into extension( pt doesn't ever lie prone)   PATIENT EDUCATION: Education details: POC, goals Person educated: Patient and Spouse Education method: Explanation, Demonstration, Tactile cues, and Verbal cues Education comprehension: verbalized understanding, verbal cues required, and needs further education  HOME EXERCISE PROGRAM: Access Code: [email or web address] URL: [email or web address] Date: 08/02/2023 Prepared by: Almetta Fam  Exercises - Supine Hamstring Stretch with Strap  - 2 x daily - 7 x weekly - 2 reps - 15 hold - Modified Thomas Stretch  - 2 x daily - 7 x weekly - 2 reps - 15 hold - Supine Bridge   - 1 x daily - 7 x weekly - 2 sets - 10 reps - Standing Shoulder Horizontal Abduction with Resistance  - 1 x daily - 7 x weekly - 2 sets - 10 reps  GOALS: Goals reviewed with patient? Yes  SHORT TERM GOALS: Target date: 4 weeks, 08/03/23  I home program for posture and for hamstring, hip flexor stretchig Baseline: Goal status: ongoing 08/02/23, ongoing 08/23/23  LONG TERM GOALS: Target date: 10/31/23  30 sec sit to stand improve from 6 reps to 12 reps for improved coordination, strength, endurance LEs Baseline:  Goal status: 7 reps ongoing 08/04/23, 8 reps 08/23/23, 11 reps 09/26/23  2.  I and pain free supine to sit motion , rolling to R Baseline: very painful per pt and wife history, not specifically assessed in clinic today Goal status: ongoing 07/28/23, ongoing 08/09/23, still has pain with bed mobility 08/23/23, can do but still painful 09/26/23  3.  TUG score improve from 33 sec to 18 or less for reduced fall risk   Baseline:  Goal status: 27s ongoing 08/04/23, 22s 08/23/23, 20s 09/26/23  4.  Improve strength LE's for B quads, hamstrings to 4+5 for improved functional strength Baseline: 4- Goal status: 4/5 08/04/23, 5/5 MET 08/23/23   ASSESSMENT:  CLINICAL IMPRESSION: Patient doing well, and is close to meeting most of his goals. Continued to challenge movement patterns and stability. He has some fatigue with standing exercises today. Will continue to progress to improve his overall function.    EVAL Patient is a 78 y.o. male who was evaluated today by physical therapy due to referral from his neurologist, with decline in function, particularly control of R extremities, with his Parkinson's diagnosis.  He and his wife have been proactive about maintaining his function, he attends a training/ strengthening session locally 2 x week , attends parkinson's cycling weekly, and walks around 0.6 miles with his aide 3 x week outdoors with his rollator.  They recently have adapted their home with a main  floor renovation so that their bedroom, bath, kitchen are all on one level.   Main problems identified today are weakness B LE's, L ankle is weak chronically due to lumbar radiculopathy and B knee, hips musculature is weak.  He also has coordination deficits with festinating gait particularly on turns and static / dynamic balance deficits.  Has chronic R sided lower back pain which is particularly intense with moving supine to sit.  He should benefit from physical therapy intervention to address his deficits and update his current routine for stretching and strengthening at home.    OBJECTIVE IMPAIRMENTS: Abnormal gait, decreased activity tolerance, decreased balance, decreased coordination, decreased endurance, decreased mobility, difficulty walking, decreased strength, hypomobility, impaired perceived functional ability, impaired UE functional use, postural dysfunction, and pain.   ACTIVITY LIMITATIONS: carrying, lifting, squatting, stairs, transfers, and locomotion level  PARTICIPATION LIMITATIONS: meal prep, cleaning, laundry, shopping, community activity, and church  PERSONAL FACTORS: Age, Behavior pattern, Fitness, Past/current experiences, Time since onset of injury/illness/exacerbation, and 1-2 comorbidities: h/o L lumbar radiculopathy and weakness L LE, chronic R sided lower back pain, are also affecting patient's functional outcome.   REHAB POTENTIAL: Good  CLINICAL DECISION MAKING: Evolving/moderate complexity  EVALUATION COMPLEXITY: Moderate  PLAN:  PT FREQUENCY: 2x/week  PT DURATION: 12 weeks  PLANNED INTERVENTIONS: 97110-Therapeutic exercises, 97530- Therapeutic activity, [id number]- Neuromuscular re-education, 97535- Self Care, and [id number]- Manual therapy  PLAN FOR NEXT SESSION: continue with balance, stretching, coordination activites   Almetta Fam, PT, DPT 09/26/2023, 2:44 PM

## 2023-09-26 NOTE — Therapy (Signed)
 OUTPATIENT OCCUPATIONAL THERAPY [facility] treatment  Patient Name: Patrick Ross MRN: [id number] DOB:[birth year], 78 y.o., male Today's Date: 09/26/2023  PCP: Dr. Millicent REFERRING PROVIDER:Dr. Tat  END OF SESSION:  OT End of Session - 09/26/23 1409     Visit Number 9    Number of Visits 25    Date for OT Re-Evaluation 11/08/23    Authorization Type MCR, [employer]    Authorization Time Period 12 weeks, anticipate d/c after 8 weeks    Authorization - Visit Number 9    Progress Note Due on Visit 10    OT Start Time 1445    OT Stop Time 1530    OT Time Calculation (min) 45 min              Past Medical History:  Diagnosis Date   Depression    DVT (deep venous thrombosis) (HCC)    GERD (gastroesophageal reflux disease)    History of kidney stones    passed   Hypertension    Hypothyroidism    Lumbar stenosis    OSA (obstructive sleep apnea)    wears cpap   Parkinson's disease (HCC)    PONV (postoperative nausea and vomiting)    REM behavioral disorder    Urinary incontinence    Past Surgical History:  Procedure Laterality Date   BACK SURGERY     x 2 - not fusion   BIOPSY  06/01/2022   Procedure: BIOPSY;  Surgeon: Dianna Specking, MD;  Location: [facility];  Service: Gastroenterology;;   COLONOSCOPY     DEEP BRAIN STIMULATOR PLACEMENT     left 12/27/2012, right 12/10/2011, left revision 02/02/2013, replacement bilateral 11/21/2015   ESOPHAGOGASTRODUODENOSCOPY (EGD) WITH PROPOFOL  N/A 06/01/2022   Procedure: ESOPHAGOGASTRODUODENOSCOPY (EGD) WITH PROPOFOL ;  Surgeon: Dianna Specking, MD;  Location: [facility];  Service: Gastroenterology;  Laterality: N/A;   MINOR PLACEMENT OF FIDUCIAL N/A 01/09/2019   Procedure: Fiducial placement;  Surgeon: Unice Pac, MD;  Location: [facility] OR;  Service: Neurosurgery;  Laterality: N/A;  Fiducial placement   PARTIAL NEPHRECTOMY Right 2005 ish   PULSE GENERATOR IMPLANT Right 01/23/2019   Procedure: Right chest implantable pulse  generator change;  Surgeon: Unice Pac, MD;  Location[facility] OR;  Service: Neurosurgery;  Laterality: Right;   SPINAL FUSION     lumbar Fusion x 2   SUBTHALAMIC STIMULATOR BATTERY REPLACEMENT Left 02/02/2018   Procedure: Change implantable pulse generator battery, Left chest;  Surgeon: Unice Pac, MD;  Location: [facility] OR;  Service: Neurosurgery;  Laterality: Left;   SUBTHALAMIC STIMULATOR BATTERY REPLACEMENT N/A 01/22/2020   Procedure: REMOVAL OF LEFT BRAIN DEEP BRAIN STIMULATOR ELECTRODE, EXTENSION, AND PULSE GENERATOR;  Surgeon: Unice Pac, MD;  Location: [facility] OR;  Service: Neurosurgery;  Laterality: N/A;   SUBTHALAMIC STIMULATOR BATTERY REPLACEMENT Right 02/23/2022   Procedure: CHANGE IPG BATTERY, RT CHEST;  Surgeon: Dawley, Lani JAYSON, DO;  Location: [facility] OR;  Service: Neurosurgery;  Laterality: Right;   SUBTHALAMIC STIMULATOR INSERTION Left 01/16/2019   Procedure: Left deep brain stimulator electrode revision.;  Surgeon: Unice Pac, MD;  Location: [facility] OR;  Service: Neurosurgery;  Laterality: Left;   SUBTHALAMIC STIMULATOR INSERTION Left 01/23/2019   Procedure: Repositioning of Left Deep Brain Stimulator Electrode;  Surgeon: Unice Pac, MD;  Location: [facility] OR;  Service: Neurosurgery;  Laterality: Left;   TONSILLECTOMY     Patient Active Problem List   Diagnosis Date Noted   Melena 06/01/2022   Acute cystitis without hematuria 05/31/2022   Upper GI bleed 05/31/2022   Generalized  weakness 05/30/2022   Abdominal pain 05/30/2022   OSA (obstructive sleep apnea) 05/30/2022   Hypothyroidism 05/30/2022   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) 12/14/2021   Mixed hyperlipidemia 12/14/2021   Essential hypertension 12/14/2021   Palpitations 12/14/2021   Angina pectoris (HCC) 12/14/2021   Urge incontinence 01/15/2021   History of UTI 01/15/2021   Abnormal urine findings 01/15/2021   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   History of nephrolithiasis 01/15/2021   DVT, lower extremity,  proximal, acute, right (HCC) 02/26/2020   Open scalp wound 01/22/2020   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HCC)    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   Parkinson's disease (HCC) 01/16/2018   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) 01/16/2018   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    ONSET DATE: 07/28/23- referral date  REFERRING DIAG:  Diagnosis  Z74.1 (ICD-10-CM) - Requires assistance with activities of daily living (ADL)  G20.B2 (ICD-10-CM) - Parkinson's disease with dyskinesia and fluctuating manifestations (HCC)    THERAPY DIAG:  Other lack of coordination  Other abnormalities of gait and mobility  Other symptoms and signs involving the nervous system  Attention and concentration deficit  Frontal lobe and executive function deficit  Rationale for Evaluation and Treatment: Rehabilitation  SUBJECTIVE:   SUBJECTIVE STATEMENT: Pt reports he  went to a cookout this weekend Pt accompanied by: self  PERTINENT HISTORY: parkinson's diagnosed greater than 10 yrs ago, deep brain stimulator R, had in L as well but had to be removed for various reasons. Depression, sleep apnea, hx of back surgery, see hx above walks around neighborhood 3 x week with aide Tuesdays and Thursdays parkisons bicycle class 2 x Drue training for upper body work out   PRECAUTIONS: Fall- PT is addressing  WEIGHT BEARING RESTRICTIONS: No  PAIN:  Are you having pain? Yes: NPRS scale: 3-5/10, generalized Pain location: back, hip Pain description: aching Aggravating factors: malposiitoning Relieving factors: rest,   FALLS: Has patient fallen in last 6 months? No  LIVING ENVIRONMENT: Lives with: lives with their spouse, has CNA 3x weeks Lives in: House/apartment Stairs: Yes: Internal: has stairt lift, and coverted garage appt on ground level Has following equipment at home: U-step  PLOF: Needs assistance with ADLs and Needs assistance with homemaking  PATIENT GOALS: Pt wants to be able to tie shoes, and  more I with dressing  OBJECTIVE:  Note: Objective measures were completed at Evaluation unless otherwise noted.  HAND DOMINANCE: Right  ADLs: Overall ADLs: increased time required  Transfers/ambulation related to ADLs: Eating: difficulty cutting food, needs assist  Grooming: mod I with brushing teeth, combing, CNA assists with shaving UB Dressing: mod I LB Dressing: mod-max A for LB dressing Toileting: mod I with U-step Bathing: supervision-walk in shower, level has built in seat and grab bars Tub Shower transfers: min A Equipment: Grab bars and level walk in shower with built in bench  IADLs:Dependent with home management  Medication management: wife assists Financial management: wife handles Handwriting: Moderate micrographia at sentence level  MOBILITY STATUS: mod I with U-step  POSTURE COMMENTS:  rounded shoulders and forward head   FUNCTIONAL OUTCOME MEASURES: Fastening/unfastening 3 buttons: 1 min 43 secs   COORDINATION: 9 Hole Peg test: Right: 55.08 sec; Left: 49.17 sec Box and Blocks:  Right 26blocks, Left 34blocks  UE ROM:  A/ROM:shoulder flexion RUE-100 LUE105, elbow extension RUE-35 LUE -25, decreased bilateral supination, RUE 95% finger extension, maintains fingers flexed    SENSATION: WFL    COGNITION: Overall cognitive status: Impaired, slowed processing and short  term memory deficits  OBSERVATIONS: Bradykinesia and resting tremor RUE, dystonia RUE                                                                                                                    TREATMENT DATE:09/26/23- UBE x 6 mins level 1  Supine PWR! up, with mod facilitation, PWR! twist rolling like a log due to history of back surgery. this was discontinuesd as pt had back and hip pain. PWR1 step with bridge x 6 reps to simulate bed mobility, then rolling to side lying to sit up, min-mod facilitation/ v.c Modified quadraped PWR! rock, min facilitation/ v.c  dynamic step and  reach at countertop min v.c for amplitude. stepping forwards and backwards at countertop min A Flipping and dealing playing cards for increased fine motor coordiantion, mod difficulty, v.c with RUE due to rigidity/ spasticity.  09/14/23 UBE x 5 mins level 1 for conditioning Standing at counter PWR! rock x 20 reps to targets, modified quadraped PWR! rock x 10 reps, min v.c and facilitation for amplitude seated PWR! up x 10 reps, v.c and demonstration Reviewed PWR! hands for PWR! up, rock, twist and step followed by fatening buttons with big movements mod v.c and demonstration. Therapist started checking short term goals. see goals below. 09/12/23 UBE x 6 mins level 1 Standing at counter PWR! rock x 20 reps to targets, modified quadraped PWR! rock x 10 reps, min v.c and facilitation for amplitude Dynamic step and reach flipping large playing cards, min v.c and facilitation Stepping to targets with big movements, mod v.c fo amplitude   09/07/23 PWR! rock modified quadraped and standing x 10 reps min v.c UBE x 6 mins level 1 for conditioning Functional reaching in standing alternating between left and right hands to place large pegs in vertical pegboard, min v.c for amplitude PWR! hands for PWR! up, rock  and twist 10 reps each min v.c for amplitude Flipping and dealing playing cards with left and right UE's, min difficulty with left, min-mod for RUE, v.c for big movements   08/31/23 PWr! hands basic 4 seated at table followed by flipping cards with big movments, mod v.c and demonstration. Standing at sink, modified quadraped PWR! rock, then standing PWR! rock x 10 reps each, Marching in place with big movements, min-mod v.c for amplitude, then stepping back with alternate feet holding a chair back, then out to the side x 10 reps, min v.c and supervision. Dynamic step and reach to retrieve replace items in cabinets with left and right UE's mod v.c fo amplitude Wiping cabinets for wall slides to  promote shoulder flexion. UBE x 5 mins level 1 for conditioning.   08/29/23- UBE x 6 mins level 1 for conditioning, pt mainatined 40 rpm PWR! up in seated, x 10 reps attempted PWR! rock seated but it was uncomfortable for back so discontiinued. Dynamic step and reach to flip large playing cards at countertop, to left and right sides, then at an angle behind (like PWR! step in  standing) mod v.c for amplitude and foot placement  PWR! hands followed by placing metal pegs 3 sizes into pegboard with RUE and removing with in hand manipulation, min-mod difficulty/ v.c for amplitude and flicks. Increased time required 08/24/23-PWR!modified quadraped rock at counter then standing PWR! rock to targets, min-mod v.c for amplitude, min facilitation Seated eedge of met, Doffing then donning sock and shoes with big movments, mod v.c and min-mod facillitaion. Pt tried sock aide to donn left sock, hoever ift was easier to donn right sock manually without the sock aide. Increased time required  Pt attempted to fasten buttons on tabletop, however max difficulty despite v.c so task was discontinued. PWR! hands, followed by picking up coins to place in coin slot, min difficulty v.c  08/22/23- UBE x 5 mins level 1 for conditioning. PWR! up rock and step in seated, mod v.c and demonstraiton. PWR! rock in standing min v.c 10-20 reps each. Marching in standing at the sink, min v.c for amplitude. See pt education  08/16/23- eval only    PATIENT EDUCATION: Education details:  see above Person educated: Patient,  Education method: Explanation demonstration, v.c Education comprehension: verbalized understanding, returned demonstration, v.c  HOME EXERCISE PROGRAM: PWR! hands, beginning coordination-08/22/23  GOALS: Goals reviewed with patient? Yes  SHORT TERM GOALS: Target date: 09/14/23   I with PD specific HEP  Goal status: ongoing- needs reinforcement 09/14/23  2.  Pt will demonstrate understanding of adapted  strategies/ AE to increase safety and I with ADLs/IADLs. Goal status:  ongoing, initiated  needs reinforcement 09/14/23  3.  Pt will demonstrate improved bilateral UE use as evidenced by increasing box and blocks score by 3 blocks bilaterally.   Goal status: RUE 40 blocks,  LUE 39 blocks, goal met 09/14/23  4.  Pt will demonstrate improved bilateral UE functional use and ease with dressing as evidenced by decreasing 3 button/ unbutton test time by 5 secs.  Goal status: ongoing >3 mins, 09/14/23  5.  Pt will demonstrate ability to retrieve a lightweight object at 105 shoulder flexion and -30 elbow extension with RUE.  Goal status:  ongoing 08/24/23  6.  Pt will demonstrate ability to retrieve a lightweight object at 110 shoulder flexion and -20 elbow extension with LUE  Goal status:  ongoing 08/24/23  LONG TERM GOALS: Target date: 11/08/23   Pt will demonstrate improved fine motor coordination for ADLs as evidenced by decreasing 9 hole peg test score for RUE by 4 secs  Goal status: INITIAL  2.  Pt will demonstrate improved fine motor coordination for ADLs as evidenced by decreasing 9 hole peg test score for LUE by 4 secs  Goal status: INITIAL   3..  Pt will perfrom PPT#4in 1 min15 secs or less without assist or LOB.  Baseline: 1 min 23 secs with min A- 09/14/23          Goal status; new 09/14/23   4.  Pt will verbalize understanding of ways to prevent future PD related complications  Goal status: INITIAL  5.  Pt will write a short paragraph with 100% legibility and minimal decrease in letter size  Goal status: INITIAL  6. Pt will perform PPT#2 in 15 secs or less Baseline: 17.02 Goal status: initial  ASSESSMENT:  CLINICAL IMPRESSION: Patient is progressing towards goals. He benefiits from v.c for amplitude during mobility/ functional activity.He fatigued quickly today when practicing bed mobility. PERFORMANCE DEFICITS: in functional skills including ADLs, IADLs, coordination,  dexterity, tone, ROM, strength, flexibility, Fine motor  control, Gross motor control, mobility, balance, endurance, decreased knowledge of precautions, decreased knowledge of use of DME, vision, and UE functional use, cognitive skills including attention, memory, problem solving, safety awareness, sequencing, and thought, and psychosocial skills including coping strategies, environmental adaptation, habits, interpersonal interactions, and routines and behaviors.   IMPAIRMENTS: are limiting patient from ADLs, IADLs, rest and sleep, play, leisure, and social participation.   COMORBIDITIES:  may have co-morbidities  that affects occupational performance. Patient will benefit from skilled OT to address above impairments and improve overall function.  MODIFICATION OR ASSISTANCE TO COMPLETE EVALUATION: Min-Moderate modification of tasks or assist with assess necessary to complete an evaluation.  OT OCCUPATIONAL PROFILE AND HISTORY: Detailed assessment: Review of records and additional review of physical, cognitive, psychosocial history related to current functional performance.  CLINICAL DECISION MAKING: Moderate - several treatment options, min-mod task modification necessary  REHAB POTENTIAL: Good  EVALUATION COMPLEXITY: Low    PLAN:  OT FREQUENCY: 2x/week  OT DURATION: 12 weeks  PLANNED INTERVENTIONS: 97168 OT Re-evaluation, 97535 self care/ADL training, 02889 therapeutic exercise, 97530 therapeutic activity, 97112 neuromuscular re-education, 97140 manual therapy, 97116 gait training, 02886 aquatic therapy, 97035 ultrasound, 97018 paraffin, 02989 moist heat, 97010 cryotherapy, 97034 contrast bath, 97129 Cognitive training (first 15 min), 02869 Cognitive training(each additional 15 min), passive range of motion, balance training, functional mobility training, visual/perceptual remediation/compensation, energy conservation, coping strategies training, patient/family education, and DME and/or AE  instructions  RECOMMENDED OTHER SERVICES: n/a  CONSULTED AND AGREED WITH PLAN OF CARE: Patient  PLAN FOR NEXT SESSION: 10th visit PN,check shoulder ROM   Shamarra Warda, OT 09/26/2023, 2:10 PM

## 2023-09-28 ENCOUNTER — Ambulatory Visit (INDEPENDENT_AMBULATORY_CARE_PROVIDER_SITE_OTHER): Admitting: Psychology

## 2023-09-28 DIAGNOSIS — F02A18 Dementia in other diseases classified elsewhere, mild, with other behavioral disturbance: Secondary | ICD-10-CM | POA: Diagnosis not present

## 2023-09-28 DIAGNOSIS — G20A1 Parkinson's disease without dyskinesia, without mention of fluctuations: Secondary | ICD-10-CM | POA: Diagnosis not present

## 2023-09-28 NOTE — Therapy (Signed)
 OUTPATIENT PHYSICAL THERAPY NEURO TREATMENT    Patient Name: Patrick Ross MRN: [id number] DOB:June 12, 1945, 78 y.o., male Today's Date: 09/29/2023   PCP: Millicent Sharper MD REFERRING PROVIDER: Evonnie Asberry RAMAN DO  END OF SESSION:  PT End of Session - 09/29/23 1547     Visit Number 15    Date for PT Re-Evaluation 10/31/23    Authorization Type Medicare    Progress Note Due on Visit 20    PT Start Time 1547    PT Stop Time 1630    PT Time Calculation (min) 43 min    Activity Tolerance Patient tolerated treatment well    Behavior During Therapy WFL for tasks assessed/performed                        Past Medical History:  Diagnosis Date   Depression    DVT (deep venous thrombosis) (HCC)    GERD (gastroesophageal reflux disease)    History of kidney stones    passed   Hypertension    Hypothyroidism    Lumbar stenosis    OSA (obstructive sleep apnea)    wears cpap   Parkinson's disease (HCC)    PONV (postoperative nausea and vomiting)    REM behavioral disorder    Urinary incontinence    Past Surgical History:  Procedure Laterality Date   BACK SURGERY     x 2 - not fusion   BIOPSY  06/01/2022   Procedure: BIOPSY;  Surgeon: Dianna Specking, MD;  Location: [facility];  Service: Gastroenterology;;   COLONOSCOPY     DEEP BRAIN STIMULATOR PLACEMENT     left 12/27/2012, right 12/10/2011, left revision 02/02/2013, replacement bilateral 11/21/2015   ESOPHAGOGASTRODUODENOSCOPY (EGD) WITH PROPOFOL  N/A 06/01/2022   Procedure: ESOPHAGOGASTRODUODENOSCOPY (EGD) WITH PROPOFOL ;  Surgeon: Dianna Specking, MD;  Location: [facility];  Service: Gastroenterology;  Laterality: N/A;   MINOR PLACEMENT OF FIDUCIAL N/A 01/09/2019   Procedure: Fiducial placement;  Surgeon: Unice Pac, MD;  Location: [facility] OR;  Service: Neurosurgery;  Laterality: N/A;  Fiducial placement   PARTIAL NEPHRECTOMY Right 2005 ish   PULSE GENERATOR IMPLANT Right 01/23/2019   Procedure: Right chest  implantable pulse generator change;  Surgeon: Unice Pac, MD;  Location: [facility] OR;  Service: Neurosurgery;  Laterality: Right;   SPINAL FUSION     lumbar Fusion x 2   SUBTHALAMIC STIMULATOR BATTERY REPLACEMENT Left 02/02/2018   Procedure: Change implantable pulse generator battery, Left chest;  Surgeon: Unice Pac, MD;  Location: [facility] OR;  Service: Neurosurgery;  Laterality: Left;   SUBTHALAMIC STIMULATOR BATTERY REPLACEMENT N/A 01/22/2020   Procedure: REMOVAL OF LEFT BRAIN DEEP BRAIN STIMULATOR ELECTRODE, EXTENSION, AND PULSE GENERATOR;  Surgeon: Unice Pac, MD;  Location: [facility] OR;  Service: Neurosurgery;  Laterality: N/A;   SUBTHALAMIC STIMULATOR BATTERY REPLACEMENT Right 02/23/2022   Procedure: CHANGE IPG BATTERY, RT CHEST;  Surgeon: Dawley, Lani JAYSON, DO;  Location: [facility] OR;  Service: Neurosurgery;  Laterality: Right;   SUBTHALAMIC STIMULATOR INSERTION Left 01/16/2019   Procedure: Left deep brain stimulator electrode revision.;  Surgeon: Unice Pac, MD;  Location: [facility] OR;  Service: Neurosurgery;  Laterality: Left;   SUBTHALAMIC STIMULATOR INSERTION Left 01/23/2019   Procedure: Repositioning of Left Deep Brain Stimulator Electrode;  Surgeon: Unice Pac, MD;  Location: [facility] OR;  Service: Neurosurgery;  Laterality: Left;   TONSILLECTOMY     Patient Active Problem List   Diagnosis Date Noted   Melena 06/01/2022   Acute cystitis without hematuria 05/31/2022  Upper GI bleed 05/31/2022   Generalized weakness 05/30/2022   Abdominal pain 05/30/2022   OSA (obstructive sleep apnea) 05/30/2022   Hypothyroidism 05/30/2022   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) 12/14/2021   Mixed hyperlipidemia 12/14/2021   Essential hypertension 12/14/2021   Palpitations 12/14/2021   Angina pectoris (HCC) 12/14/2021   Urge incontinence 01/15/2021   History of UTI 01/15/2021   Abnormal urine findings 01/15/2021   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   History of nephrolithiasis 01/15/2021    DVT, lower extremity, proximal, acute, right (HCC) 02/26/2020   Open scalp wound 01/22/2020   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HCC)    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   Parkinson's disease (HCC) 01/16/2018   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) 01/16/2018   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    ONSET DATE: exacerbation 06/21/23, Parkinson's diagnosis 2010  REFERRING DIAG: parkinsons disease with fluctuating dyskinesia  THERAPY DIAG:  Other lack of coordination  Other abnormalities of gait and mobility  Parkinson's disease with dyskinesia, with fluctuations (HCC)  Other symptoms and signs involving the nervous system  Rationale for Evaluation and Treatment: Rehabilitation  SUBJECTIVE:                                                                                                                                                                                             SUBJECTIVE STATEMENT: I have been okay.    EVAL-Pt and his wife report long history of parkinson's,  biggest difficulty with his mobility that they note is R lower back  pain and labored supine to sit movement, also poor foot clearance.  He doesn't like relying on the walker Pt accompanied by: significant other  PERTINENT HISTORY: parkinson's diagnosed greater than 10 yrs ago, deep brain stimulator R, had in L as well but had to be removed for various reasons.  walks around neighborhood 3 x week with aide Tuesdays and Thursdays parkisons bicycle class 2 x Drue training for upper body work out Referred to PT and ST by neurologist   PAIN:  Are you having pain? R lower back pain constant  PRECAUTIONS: Fall and Other: deep brain stimulator on R   RED FLAGS: None   WEIGHT BEARING RESTRICTIONS: No  FALLS: Has patient fallen in last 6 months? No  LIVING ENVIRONMENT: Lives with: lives with their spouse Lives in: House/apartment Stairs: has internal steps, but has had home renovated and adapted so the he and  his wife live in downstairs apartment, all one level with bathroom and kitchen Has following equipment at home: Single point cane, Environmental consultant - 4 wheeled, Tour manager, Grab bars, and Ramped entry  PLOF:  Independent  PATIENT GOALS: improve bed mobility and improve gait, stability, avoid falls  OBJECTIVE:  Note: Objective measures were completed at Evaluation unless otherwise noted.  DIAGNOSTIC FINDINGS: see neuro notes, has deep brain stimulator R   COGNITION: Overall cognitive status: Within functional limits for tasks assessed   SENSATION: WFL  COORDINATION: Slowed movements B and noted curling R hand and R foot at times, with gait festinating on turns   EDEMA:  None noted  MUSCLE TONE: noted curling R fingers, toes, knee flex and hip flex at times, randomly during evaluation  MUSCLE LENGTH: Hamstrings: Right -30 deg; Left -30 deg Thomas test: Right nt deg; Left nt deg    POSTURE: Standing with some forward lean, weight on forefeet, L calf atrophied pt and wife report chronic after L lumbar surgery.     LOWER EXTREMITY ROM:   grossly wfl, hips not assessed as not positioned on mat today   LOWER EXTREMITY MMT:    MMT Right Eval Left Eval  Hip flexion 4 4-  Hip extension    Hip abduction    Hip adduction    Hip internal rotation    Hip external rotation    Knee flexion 4 4  Knee extension 4 4-  Ankle dorsiflexion 4 3+  Ankle plantarflexion 4- 3-  Ankle inversion    Ankle eversion    (Blank rows = not tested)  BED MOBILITY:  Not tested  TRANSFERS: I but slowed, uses hands on seat or with rollator  GAIT: Findings: Gait Characteristics: step to pattern, decreased step length- Right, decreased step length- Left, decreased stride length, decreased hip/knee flexion- Right, decreased hip/knee flexion- Left, shuffling, festinating, trunk flexed, and narrow BOS, Distance walked: 100', Assistive device utilized:Walker - 4 wheeled, Level of assistance: SBA, and  Comments: festinating noted on turns, tends to turn L  FUNCTIONAL TESTS:  30 seconds chair stand test 6 reps Timed up and go (TUG): 33.39 with rollator Standing static feet together 15 sec Standing semi tandem 10 sec each leg, needed CGA with L foot forward Standing marching, required B hand hold support to maintain balance                                                                                                                              TREATMENT DATE: 09/29/23 NuStep L5x63mins  On airex marching with RW STS with red ball to floor and overhead 2x8 Standing ball toss and bounces  Seated pball rolls forward and rotations x10   09/26/23 Recheck goals -30s chair test 11 reps -TUG 20s  NuStep L5 x33mins  Standing rows and ext in front of mat table 2x10 STS w/ball toss 2x10 2# boxing punches  Standing marches 3# Standing hip abd 3#   09/22/23 NuStep L5x30mins  OHP alternating 2# 2x10 Arm swings 2# 2x10 STS yellow ball chest press 2x10 Leg ext 10# 3x10 HS curls 25# 3x10 Ball slams with blue ball 2x10  09/19/23:  Prone for ant hip/trunk stretch , therapist assisting with alt knee flexion, R very stiff Prone with roll under ankles, for TKE's 5 sec holds, 10 x 2 Standing in front of mat alt arm swings high amplitude 2# wts x 15 reps each Standing for medicine ball bounces to floor with 2 KG yellow mediball mass practice Sit to stand from hi low table with yellow mediball 10x Nustep L 5 Ue's and LE's x 6 min  08/25/23 NuStep L5x35mins  Leg ext 10# 2x10 HS curls 25# 2x10 Fitter pushes 1 blue and 1 black band 2x10 STS 2x10 Arm swings holding 1 and 2# WaTe 20s x2    08/23/23 Recheck goals - 5xSTS - TUG - MMT NuStep L5x89mins  Modified sit ups holding yellow 2x10 Box tap 6 Step ups 6 Balance on airex feet together, eyes open 220s and then closed 10   08/18/23 Leg ext 10# 2x10 HS curls 25# 2x10 OHP press yellow ball 2x10 Supine LE stretching- HS, LTR, SKTC   NuStep L5 x81mins    08/16/23 Holding big red ball tapping floor to overhead to side to side x5 Standing shoulder rows and ext green 2x10 Standing shoulder flexion with 2# WaTE 2x10 Box taps 4 holding on and then without walker CGA-minA  NuStep L5x41mins STS 2x10   08/11/23 NuStep L5x49mins  Fitter pushes 2 blue bands 2x10  Side steps over obstacles in bars  On airex reaching for numbers  Isometric ab push downs 2x10   08/09/23 NuStep L5x21mins  STS with chest press red ball 2x8 Standing march 3#  3# hip abd 2x10  Horizontal abd green 2x10 Leg ext 10# 2x10 HS curls 25# 2x10    08/04/23 30s chair test TUG 27s  MMT 4/5 Standing shoulder ext and rows green band 2x10 Standing shoulder flexion 2# WaTE x10 - stopped due to feeling dizzy and off balance  NuStep L5 x13mins  Seated rotation with yellow ball 2x10   08/02/23 NuStep L5x85mins  Side steps over obstacles in bars Forwards steps over obstacles in bars Calf raises x10 Box taps 4 1HRA Step ups 4 2HRA  STS with OHP 2x5  07/28/23 Supine HS stretch and SKTC  Lower trunk rotations x10 SLR 2x10 Feet on pball rotations and knees to chest AB isometric holds x10  NuStep L5x54mins  STS 3x5   07/27/23 NuStep L5x73mins  LAQ 2# 2x10 HS curls green 2x10  Hip abduction green 2x10 STS 2x10  Stretching with pball rotations and flexion x5     07/06/23 Evaluation, advised to attempt prone lying on bed 10 to 15 min  to stretch body into extension( pt doesn't ever lie prone)   PATIENT EDUCATION: Education details: POC, goals Person educated: Patient and Spouse Education method: Explanation, Demonstration, Tactile cues, and Verbal cues Education comprehension: verbalized understanding, verbal cues required, and needs further education  HOME EXERCISE PROGRAM: Access Code: [email or web address] URL: [email or web address] Date: 08/02/2023 Prepared by: Almetta Fam  Exercises - Supine Hamstring Stretch with Strap  - 2 x  daily - 7 x weekly - 2 reps - 15 hold - Modified Thomas Stretch  - 2 x daily - 7 x weekly - 2 reps - 15 hold - Supine Bridge  - 1 x daily - 7 x weekly - 2 sets - 10 reps - Standing Shoulder Horizontal Abduction with Resistance  - 1 x daily - 7 x weekly - 2 sets - 10 reps  GOALS: Goals reviewed with patient? Yes  SHORT TERM GOALS:  Target date: 4 weeks, 08/03/23  I home program for posture and for hamstring, hip flexor stretchig Baseline: Goal status: ongoing 08/02/23, ongoing 08/23/23  LONG TERM GOALS: Target date: 10/31/23  30 sec sit to stand improve from 6 reps to 12 reps for improved coordination, strength, endurance LEs Baseline:  Goal status: 7 reps ongoing 08/04/23, 8 reps 08/23/23, 11 reps 09/26/23  2.  I and pain free supine to sit motion , rolling to R Baseline: very painful per pt and wife history, not specifically assessed in clinic today Goal status: ongoing 07/28/23, ongoing 08/09/23, still has pain with bed mobility 08/23/23, can do but still painful 09/26/23  3.  TUG score improve from 33 sec to 18 or less for reduced fall risk   Baseline:  Goal status: 27s ongoing 08/04/23, 22s 08/23/23, 20s 09/26/23  4.  Improve strength LE's for B quads, hamstrings to 4+5 for improved functional strength Baseline: 4- Goal status: 4/5 08/04/23, 5/5 MET 08/23/23   ASSESSMENT:  CLINICAL IMPRESSION: Patient doing well, no falls. Continued to challenge movement patterns and stability. With ball throws and bounces he shifts weight on to his heels. Loses balance backwards with this after a few minutes. First set of STS were hard but second set was better. Will continue to progress to improve his overall function.    EVAL Patient is a 78 y.o. male who was evaluated today by physical therapy due to referral from his neurologist, with decline in function, particularly control of R extremities, with his Parkinson's diagnosis.  He and his wife have been proactive about maintaining his function, he attends a  training/ strengthening session locally 2 x week , attends parkinson's cycling weekly, and walks around 0.6 miles with his aide 3 x week outdoors with his rollator.  They recently have adapted their home with a main floor renovation so that their bedroom, bath, kitchen are all on one level.   Main problems identified today are weakness B LE's, L ankle is weak chronically due to lumbar radiculopathy and B knee, hips musculature is weak.  He also has coordination deficits with festinating gait particularly on turns and static / dynamic balance deficits.  Has chronic R sided lower back pain which is particularly intense with moving supine to sit.  He should benefit from physical therapy intervention to address his deficits and update his current routine for stretching and strengthening at home.    OBJECTIVE IMPAIRMENTS: Abnormal gait, decreased activity tolerance, decreased balance, decreased coordination, decreased endurance, decreased mobility, difficulty walking, decreased strength, hypomobility, impaired perceived functional ability, impaired UE functional use, postural dysfunction, and pain.   ACTIVITY LIMITATIONS: carrying, lifting, squatting, stairs, transfers, and locomotion level  PARTICIPATION LIMITATIONS: meal prep, cleaning, laundry, shopping, community activity, and church  PERSONAL FACTORS: Age, Behavior pattern, Fitness, Past/current experiences, Time since onset of injury/illness/exacerbation, and 1-2 comorbidities: h/o L lumbar radiculopathy and weakness L LE, chronic R sided lower back pain, are also affecting patient's functional outcome.   REHAB POTENTIAL: Good  CLINICAL DECISION MAKING: Evolving/moderate complexity  EVALUATION COMPLEXITY: Moderate  PLAN:  PT FREQUENCY: 2x/week  PT DURATION: 12 weeks  PLANNED INTERVENTIONS: 97110-Therapeutic exercises, 97530- Therapeutic activity, [id number]- Neuromuscular re-education, 97535- Self Care, and [id number]- Manual therapy  PLAN FOR NEXT  SESSION: continue with balance, stretching, coordination activites   Almetta Fam, PT, DPT 09/29/2023, 4:30 PM

## 2023-09-28 NOTE — Progress Notes (Signed)
   NEUROPSYCHOLOGY FEEDBACK SESSION . [facility]  [place] Department of Neurology  Date of Feedback Session: 09/28/2023  REASON FOR REFERRAL   Patrick Ross is a 78 year old, right-handed, White male with 18 years of formal education. He was referred for neuropsychological evaluation by his neurologist, Asberry Tat, D.O., to assess current neurocognitive functioning, document potential cognitive deficits, and assist with treatment planning in setting of diagnosed Parkinson's disease. He previously underwent neuropsychological evaluation with Raguel Ares, Ph.D., at [facility] in 2013, 2014, and 2015. He then had a repeat exam with Arthea Maryland, Ph.D., at [facility] on 11/17/2018. Only a summary of the initial three evaluations is available for review; however, the repeat performance from 2020 can serve as an updated baseline for comparison to today's evaluation.  FEEDBACK   Patient completed a comprehensive neuropsychological evaluation on 09/21/2023. Please refer to that encounter for the full report and recommendations. Briefly, results indicated deficits in executive functioning, processing speed, complex working memory, and aspects of learning/memory. Compared to the 2020 assessment, notable declines were observed across several tasks, with the most prominent decline in executive functioning, followed by declines in memory--particularly in encoding and retrieval--and processing speed. 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 Etiology appears most consistent with known Parkinson's disease. Additional contributing factors to the overall clinical picture may include cerebrovascular changes and intermittent mood symptoms. At this time, his profile does not strongly suggest a coexisting Alzheimer's disease process.   Today, the patient was accompanied by his wife. They were provided  verbal feedback regarding the findings and impression during this visit, and their questions were answered. A copy of the report was provided at the conclusion of the visit.  DISPOSITION   Patient will follow up with the referring provider, Dr. Evonnie. No follow-up neuropsychological testing was scheduled at this time. Please feel free to refer the patient for repeated evaluation if he shows a significant change in neurocognitive status.  SERVICE   This feedback session was conducted by Renda Beckwith, Psy.D. [place] of [id number] (35 minutes) was billed for Dr. Beckwith' time spent in preparing, conducting, and documenting the current feedback session.  This report was generated using voice recognition software. While this document has been carefully reviewed, transcription errors may be present. I apologize in advance for any inconvenience. Please contact me if further clarification is needed.

## 2023-09-29 ENCOUNTER — Ambulatory Visit

## 2023-09-29 DIAGNOSIS — G20B2 Parkinson's disease with dyskinesia, with fluctuations: Secondary | ICD-10-CM

## 2023-09-29 DIAGNOSIS — R2689 Other abnormalities of gait and mobility: Secondary | ICD-10-CM | POA: Diagnosis not present

## 2023-09-29 DIAGNOSIS — R29818 Other symptoms and signs involving the nervous system: Secondary | ICD-10-CM

## 2023-09-29 DIAGNOSIS — R278 Other lack of coordination: Secondary | ICD-10-CM

## 2023-10-03 ENCOUNTER — Ambulatory Visit: Admitting: Occupational Therapy

## 2023-10-03 ENCOUNTER — Ambulatory Visit: Attending: Family Medicine

## 2023-10-03 ENCOUNTER — Other Ambulatory Visit: Payer: Self-pay

## 2023-10-03 DIAGNOSIS — R29818 Other symptoms and signs involving the nervous system: Secondary | ICD-10-CM

## 2023-10-03 DIAGNOSIS — R262 Difficulty in walking, not elsewhere classified: Secondary | ICD-10-CM | POA: Insufficient documentation

## 2023-10-03 DIAGNOSIS — R41840 Attention and concentration deficit: Secondary | ICD-10-CM | POA: Insufficient documentation

## 2023-10-03 DIAGNOSIS — R278 Other lack of coordination: Secondary | ICD-10-CM | POA: Insufficient documentation

## 2023-10-03 DIAGNOSIS — G20B2 Parkinson's disease with dyskinesia, with fluctuations: Secondary | ICD-10-CM | POA: Insufficient documentation

## 2023-10-03 DIAGNOSIS — R41844 Frontal lobe and executive function deficit: Secondary | ICD-10-CM

## 2023-10-03 DIAGNOSIS — R2689 Other abnormalities of gait and mobility: Secondary | ICD-10-CM | POA: Diagnosis present

## 2023-10-03 NOTE — Therapy (Signed)
 OUTPATIENT PHYSICAL THERAPY NEURO TREATMENT    Patient Name: Patrick Ross MRN: [id number] DOB:10/24/1945, 78 y.o., male Today's Date: 10/03/2023   PCP: Millicent Sharper MD REFERRING PROVIDER: Evonnie Asberry RAMAN DO  END OF SESSION:  PT End of Session - 10/03/23 1516     Visit Number 16    Date for PT Re-Evaluation 10/31/23    Authorization Type Medicare    Progress Note Due on Visit 20    PT Start Time 1513    PT Stop Time 1558    PT Time Calculation (min) 45 min    Activity Tolerance Patient tolerated treatment well    Behavior During Therapy WFL for tasks assessed/performed                Past Medical History:  Diagnosis Date   Depression    DVT (deep venous thrombosis) (HCC)    GERD (gastroesophageal reflux disease)    History of kidney stones    passed   Hypertension    Hypothyroidism    Lumbar stenosis    OSA (obstructive sleep apnea)    wears cpap   Parkinson's disease (HCC)    PONV (postoperative nausea and vomiting)    REM behavioral disorder    Urinary incontinence    Past Surgical History:  Procedure Laterality Date   BACK SURGERY     x 2 - not fusion   BIOPSY  06/01/2022   Procedure: BIOPSY;  Surgeon: Dianna Specking, MD;  Location: [facility];  Service: Gastroenterology;;   COLONOSCOPY     DEEP BRAIN STIMULATOR PLACEMENT     left 12/27/2012, right 12/10/2011, left revision 02/02/2013, replacement bilateral 11/21/2015   ESOPHAGOGASTRODUODENOSCOPY (EGD) WITH PROPOFOL  N/A 06/01/2022   Procedure: ESOPHAGOGASTRODUODENOSCOPY (EGD) WITH PROPOFOL ;  Surgeon: Dianna Specking, MD;  Location: [facility];  Service: Gastroenterology;  Laterality: N/A;   MINOR PLACEMENT OF FIDUCIAL N/A 01/09/2019   Procedure: Fiducial placement;  Surgeon: Unice Pac, MD;  Location: [facility] OR;  Service: Neurosurgery;  Laterality: N/A;  Fiducial placement   PARTIAL NEPHRECTOMY Right 2005 ish   PULSE GENERATOR IMPLANT Right 01/23/2019   Procedure: Right chest implantable pulse  generator change;  Surgeon: Unice Pac, MD;  Location: [facility] OR;  Service: Neurosurgery;  Laterality: Right;   SPINAL FUSION     lumbar Fusion x 2   SUBTHALAMIC STIMULATOR BATTERY REPLACEMENT Left 02/02/2018   Procedure: Change implantable pulse generator battery, Left chest;  Surgeon: Unice Pac, MD;  Location: [facility] OR;  Service: Neurosurgery;  Laterality: Left;   SUBTHALAMIC STIMULATOR BATTERY REPLACEMENT N/A 01/22/2020   Procedure: REMOVAL OF LEFT BRAIN DEEP BRAIN STIMULATOR ELECTRODE, EXTENSION, AND PULSE GENERATOR;  Surgeon: Unice Pac, MD;  Location: [facility] OR;  Service: Neurosurgery;  Laterality: N/A;   SUBTHALAMIC STIMULATOR BATTERY REPLACEMENT Right 02/23/2022   Procedure: CHANGE IPG BATTERY, RT CHEST;  Surgeon: Dawley, Lani JAYSON, DO;  Location: [facility] OR;  Service: Neurosurgery;  Laterality: Right;   SUBTHALAMIC STIMULATOR INSERTION Left 01/16/2019   Procedure: Left deep brain stimulator electrode revision.;  Surgeon: Unice Pac, MD;  Location: [facility] OR;  Service: Neurosurgery;  Laterality: Left;   SUBTHALAMIC STIMULATOR INSERTION Left 01/23/2019   Procedure: Repositioning of Left Deep Brain Stimulator Electrode;  Surgeon: Unice Pac, MD;  Location: [facility] OR;  Service: Neurosurgery;  Laterality: Left;   TONSILLECTOMY     Patient Active Problem List   Diagnosis Date Noted   Melena 06/01/2022   Acute cystitis without hematuria 05/31/2022   Upper GI bleed 05/31/2022   Generalized weakness  05/30/2022   Abdominal pain 05/30/2022   OSA (obstructive sleep apnea) 05/30/2022   Hypothyroidism 05/30/2022   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) 12/14/2021   Mixed hyperlipidemia 12/14/2021   Essential hypertension 12/14/2021   Palpitations 12/14/2021   Angina pectoris (HCC) 12/14/2021   Urge incontinence 01/15/2021   History of UTI 01/15/2021   Abnormal urine findings 01/15/2021   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   History of nephrolithiasis 01/15/2021   DVT, lower extremity,  proximal, acute, right (HCC) 02/26/2020   Open scalp wound 01/22/2020   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HCC)    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   Parkinson's disease (HCC) 01/16/2018   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) 01/16/2018   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    ONSET DATE: exacerbation 06/21/23, Parkinson's diagnosis 2010  REFERRING DIAG: parkinsons disease with fluctuating dyskinesia  THERAPY DIAG:  Other lack of coordination  Other abnormalities of gait and mobility  Parkinson's disease with dyskinesia, with fluctuations (HCC)  Other symptoms and signs involving the nervous system  Difficulty in walking, not elsewhere classified  Rationale for Evaluation and Treatment: Rehabilitation  SUBJECTIVE:                                                                                                                                                                                             SUBJECTIVE STATEMENT: I fell in the kitchen trying to pick up something off the floor, yesterday.     EVAL-Pt and his wife report long history of parkinson's,  biggest difficulty with his mobility that they note is R lower back  pain and labored supine to sit movement, also poor foot clearance.  He doesn't like relying on the walker Pt accompanied by: significant other  PERTINENT HISTORY: parkinson's diagnosed greater than 10 yrs ago, deep brain stimulator R, had in L as well but had to be removed for various reasons.  walks around neighborhood 3 x week with aide Tuesdays and Thursdays parkisons bicycle class 2 x Drue training for upper body work out Referred to PT and ST by neurologist   PAIN:  Are you having pain? R lower back pain constant  PRECAUTIONS: Fall and Other: deep brain stimulator on R   RED FLAGS: None   WEIGHT BEARING RESTRICTIONS: No  FALLS: Has patient fallen in last 6 months? No  LIVING ENVIRONMENT: Lives with: lives with their spouse Lives in:  House/apartment Stairs: has internal steps, but has had home renovated and adapted so the he and his wife live in downstairs apartment, all one level with bathroom and kitchen Has following equipment at home: Single point cane, Walker - 4  wheeled, Shower bench, Grab bars, and Ramped entry  PLOF: Independent  PATIENT GOALS: improve bed mobility and improve gait, stability, avoid falls  OBJECTIVE:  Note: Objective measures were completed at Evaluation unless otherwise noted.  DIAGNOSTIC FINDINGS: see neuro notes, has deep brain stimulator R   COGNITION: Overall cognitive status: Within functional limits for tasks assessed   SENSATION: WFL  COORDINATION: Slowed movements B and noted curling R hand and R foot at times, with gait festinating on turns   EDEMA:  None noted  MUSCLE TONE: noted curling R fingers, toes, knee flex and hip flex at times, randomly during evaluation  MUSCLE LENGTH: Hamstrings: Right -30 deg; Left -30 deg Thomas test: Right nt deg; Left nt deg    POSTURE: Standing with some forward lean, weight on forefeet, L calf atrophied pt and wife report chronic after L lumbar surgery.     LOWER EXTREMITY ROM:   grossly wfl, hips not assessed as not positioned on mat today   LOWER EXTREMITY MMT:    MMT Right Eval Left Eval  Hip flexion 4 4-  Hip extension    Hip abduction    Hip adduction    Hip internal rotation    Hip external rotation    Knee flexion 4 4  Knee extension 4 4-  Ankle dorsiflexion 4 3+  Ankle plantarflexion 4- 3-  Ankle inversion    Ankle eversion    (Blank rows = not tested)  BED MOBILITY:  Not tested  TRANSFERS: I but slowed, uses hands on seat or with rollator  GAIT: Findings: Gait Characteristics: step to pattern, decreased step length- Right, decreased step length- Left, decreased stride length, decreased hip/knee flexion- Right, decreased hip/knee flexion- Left, shuffling, festinating, trunk flexed, and narrow BOS,  Distance walked: 100', Assistive device utilized:Walker - 4 wheeled, Level of assistance: SBA, and Comments: festinating noted on turns, tends to turn L  FUNCTIONAL TESTS:  30 seconds chair stand test 6 reps Timed up and go (TUG): 33.39 with rollator Standing static feet together 15 sec Standing semi tandem 10 sec each leg, needed CGA with L foot forward Standing marching, required B hand hold support to maintain balance                                                                                                                              TREATMENT DATE: 10/03/23:  Nustep L 5 x 6 min In ll bars for lunges on rounded side of BOSU, mass practice In ll bars with one foot on BOSU and reaches down to target, cone, with one hand pt reached within 2 of cone, needed mod assist to return to upright standing  Prone lying, therapist provided post hip stabilization with overpressure for knee flexion stretch, very stiff R LE ,  needed mod assist to return to upright sitting.   [employer] with blue ball mass practice Standing for ball toss mass practice Seated forward, side/ side ,  rotational stretch with largest ball    09/29/23 NuStep L5x30mins  On airex marching with RW STS with red ball to floor and overhead 2x8 Standing ball toss and bounces  Seated pball rolls forward and rotations x10   09/26/23 Recheck goals -30s chair test 11 reps -TUG 20s  NuStep L5 x53mins  Standing rows and ext in front of mat table 2x10 STS w/ball toss 2x10 2# boxing punches  Standing marches 3# Standing hip abd 3#   09/22/23 NuStep L5x28mins  OHP alternating 2# 2x10 Arm swings 2# 2x10 STS yellow ball chest press 2x10 Leg ext 10# 3x10 HS curls 25# 3x10 Ball slams with blue ball 2x10   09/19/23:  Prone for ant hip/trunk stretch , therapist assisting with alt knee flexion, R very stiff Prone with roll under ankles, for TKE's 5 sec holds, 10 x 2 Standing in front of mat alt arm swings high amplitude 2#  wts x 15 reps each Standing for medicine ball bounces to floor with 2 KG yellow mediball mass practice Sit to stand from hi low table with yellow mediball 10x Nustep L 5 Ue's and LE's x 6 min  08/25/23 NuStep L5x61mins  Leg ext 10# 2x10 HS curls 25# 2x10 Fitter pushes 1 blue and 1 black band 2x10 STS 2x10 Arm swings holding 1 and 2# WaTe 20s x2    08/23/23 Recheck goals - 5xSTS - TUG - MMT NuStep L5x88mins  Modified sit ups holding yellow 2x10 Box tap 6 Step ups 6 Balance on airex feet together, eyes open 220s and then closed 10   08/18/23 Leg ext 10# 2x10 HS curls 25# 2x10 OHP press yellow ball 2x10 Supine LE stretching- HS, LTR, SKTC  NuStep L5 x44mins    08/16/23 Holding big red ball tapping floor to overhead to side to side x5 Standing shoulder rows and ext green 2x10 Standing shoulder flexion with 2# WaTE 2x10 Box taps 4 holding on and then without walker CGA-minA  NuStep L5x73mins STS 2x10   08/11/23 NuStep L5x61mins  Fitter pushes 2 blue bands 2x10  Side steps over obstacles in bars  On airex reaching for numbers  Isometric ab push downs 2x10   08/09/23 NuStep L5x7mins  STS with chest press red ball 2x8 Standing march 3#  3# hip abd 2x10  Horizontal abd green 2x10 Leg ext 10# 2x10 HS curls 25# 2x10    08/04/23 30s chair test TUG 27s  MMT 4/5 Standing shoulder ext and rows green band 2x10 Standing shoulder flexion 2# WaTE x10 - stopped due to feeling dizzy and off balance  NuStep L5 x18mins  Seated rotation with yellow ball 2x10   08/02/23 NuStep L5x31mins  Side steps over obstacles in bars Forwards steps over obstacles in bars Calf raises x10 Box taps 4 1HRA Step ups 4 2HRA  STS with OHP 2x5  07/28/23 Supine HS stretch and SKTC  Lower trunk rotations x10 SLR 2x10 Feet on pball rotations and knees to chest AB isometric holds x10  NuStep L5x110mins  STS 3x5   07/27/23 NuStep L5x33mins  LAQ 2# 2x10 HS curls green 2x10  Hip abduction  green 2x10 STS 2x10  Stretching with pball rotations and flexion x5     07/06/23 Evaluation, advised to attempt prone lying on bed 10 to 15 min  to stretch body into extension( pt doesn't ever lie prone)   PATIENT EDUCATION: Education details: POC, goals Person educated: Patient and Spouse Education method: Explanation, Demonstration, Tactile cues, and Verbal cues Education comprehension:  verbalized understanding, verbal cues required, and needs further education  HOME EXERCISE PROGRAM: Access Code: [email or web address] URL: [email or web address] Date: 08/02/2023 Prepared by: Almetta Fam  Exercises - Supine Hamstring Stretch with Strap  - 2 x daily - 7 x weekly - 2 reps - 15 hold - Modified Thomas Stretch  - 2 x daily - 7 x weekly - 2 reps - 15 hold - Supine Bridge  - 1 x daily - 7 x weekly - 2 sets - 10 reps - Standing Shoulder Horizontal Abduction with Resistance  - 1 x daily - 7 x weekly - 2 sets - 10 reps  GOALS: Goals reviewed with patient? Yes  SHORT TERM GOALS: Target date: 4 weeks, 08/03/23  I home program for posture and for hamstring, hip flexor stretchig Baseline: Goal status: ongoing 08/02/23, ongoing 08/23/23  LONG TERM GOALS: Target date: 10/31/23  30 sec sit to stand improve from 6 reps to 12 reps for improved coordination, strength, endurance LEs Baseline:  Goal status: 7 reps ongoing 08/04/23, 8 reps 08/23/23, 11 reps 09/26/23  2.  I and pain free supine to sit motion , rolling to R Baseline: very painful per pt and wife history, not specifically assessed in clinic today Goal status: ongoing 07/28/23, ongoing 08/09/23, still has pain with bed mobility 08/23/23, can do but still painful 09/26/23  3.  TUG score improve from 33 sec to 18 or less for reduced fall risk   Baseline:  Goal status: 27s ongoing 08/04/23, 22s 08/23/23, 20s 09/26/23  4.  Improve strength LE's for B quads, hamstrings to 4+5 for improved functional strength Baseline: 4- Goal status: 4/5 08/04/23,  5/5 MET 08/23/23   ASSESSMENT:  CLINICAL IMPRESSION: Patient had a fall yesterday reaching for something from floor.  In ll bars unable to reach floor with either hand while attempting.  Also his wife reports his cognition not as well over last few weeks, she is concerned about his dementia.  Spent entire session reviewing safety awareness.  He had more lower back pain than usual today.  Improved with using the physioball.     EVAL Patient is a 78 y.o. male who was evaluated today by physical therapy due to referral from his neurologist, with decline in function, particularly control of R extremities, with his Parkinson's diagnosis.  He and his wife have been proactive about maintaining his function, he attends a training/ strengthening session locally 2 x week , attends parkinson's cycling weekly, and walks around 0.6 miles with his aide 3 x week outdoors with his rollator.  They recently have adapted their home with a main floor renovation so that their bedroom, bath, kitchen are all on one level.   Main problems identified today are weakness B LE's, L ankle is weak chronically due to lumbar radiculopathy and B knee, hips musculature is weak.  He also has coordination deficits with festinating gait particularly on turns and static / dynamic balance deficits.  Has chronic R sided lower back pain which is particularly intense with moving supine to sit.  He should benefit from physical therapy intervention to address his deficits and update his current routine for stretching and strengthening at home.    OBJECTIVE IMPAIRMENTS: Abnormal gait, decreased activity tolerance, decreased balance, decreased coordination, decreased endurance, decreased mobility, difficulty walking, decreased strength, hypomobility, impaired perceived functional ability, impaired UE functional use, postural dysfunction, and pain.   ACTIVITY LIMITATIONS: carrying, lifting, squatting, stairs, transfers, and locomotion  level  PARTICIPATION LIMITATIONS: meal prep, cleaning, laundry, shopping, community  activity, and church  PERSONAL FACTORS: Age, Behavior pattern, Fitness, Past/current experiences, Time since onset of injury/illness/exacerbation, and 1-2 comorbidities: h/o L lumbar radiculopathy and weakness L LE, chronic R sided lower back pain, are also affecting patient's functional outcome.   REHAB POTENTIAL: Good  CLINICAL DECISION MAKING: Evolving/moderate complexity  EVALUATION COMPLEXITY: Moderate  PLAN:  PT FREQUENCY: 2x/week  PT DURATION: 12 weeks  PLANNED INTERVENTIONS: 97110-Therapeutic exercises, 97530- Therapeutic activity, [id number]- Neuromuscular re-education, 97535- Self Care, and [id number]- Manual therapy  PLAN FOR NEXT SESSION: continue with balance, stretching, coordination activites   Holger Sokolowski L Danyael Alipio, PT, DPT, OCS 10/03/2023, 3:17 PM

## 2023-10-03 NOTE — Therapy (Unsigned)
 OUTPATIENT OCCUPATIONAL THERAPY [facility] treatment  Patient Name: Patrick Ross MRN: [id number] DOB:Oct 08, 1945, 78 y.o., male Today's Date: 10/03/2023  PCP: Dr. Millicent REFERRING PROVIDER:Dr. Tat  END OF SESSION:  OT End of Session - 10/03/23 1420     Visit Number 10    Number of Visits 25    Date for OT Re-Evaluation 11/08/23    Authorization Type MCR, [employer]    Authorization Time Period 12 weeks, anticipate d/c after 8 weeks    Authorization - Visit Number 10    OT Start Time 1405    OT Stop Time 1448    OT Time Calculation (min) 43 min              Past Medical History:  Diagnosis Date   Depression    DVT (deep venous thrombosis) (HCC)    GERD (gastroesophageal reflux disease)    History of kidney stones    passed   Hypertension    Hypothyroidism    Lumbar stenosis    OSA (obstructive sleep apnea)    wears cpap   Parkinson's disease (HCC)    PONV (postoperative nausea and vomiting)    REM behavioral disorder    Urinary incontinence    Past Surgical History:  Procedure Laterality Date   BACK SURGERY     x 2 - not fusion   BIOPSY  06/01/2022   Procedure: BIOPSY;  Surgeon: Dianna Specking, MD;  Location: [facility];  Service: Gastroenterology;;   COLONOSCOPY     DEEP BRAIN STIMULATOR PLACEMENT     left 12/27/2012, right 12/10/2011, left revision 02/02/2013, replacement bilateral 11/21/2015   ESOPHAGOGASTRODUODENOSCOPY (EGD) WITH PROPOFOL  N/A 06/01/2022   Procedure: ESOPHAGOGASTRODUODENOSCOPY (EGD) WITH PROPOFOL ;  Surgeon: Dianna Specking, MD;  Location: [facility];  Service: Gastroenterology;  Laterality: N/A;   MINOR PLACEMENT OF FIDUCIAL N/A 01/09/2019   Procedure: Fiducial placement;  Surgeon: Unice Pac, MD;  Location: [facility] OR;  Service: Neurosurgery;  Laterality: N/A;  Fiducial placement   PARTIAL NEPHRECTOMY Right 2005 ish   PULSE GENERATOR IMPLANT Right 01/23/2019   Procedure: Right chest implantable pulse generator change;  Surgeon: Unice Pac, MD;  Location: [facility] OR;  Service: Neurosurgery;  Laterality: Right;   SPINAL FUSION     lumbar Fusion x 2   SUBTHALAMIC STIMULATOR BATTERY REPLACEMENT Left 02/02/2018   Procedure: Change implantable pulse generator battery, Left chest;  Surgeon: Unice Pac, MD;  Location: [facility] OR;  Service: Neurosurgery;  Laterality: Left;   SUBTHALAMIC STIMULATOR BATTERY REPLACEMENT N/A 01/22/2020   Procedure: REMOVAL OF LEFT BRAIN DEEP BRAIN STIMULATOR ELECTRODE, EXTENSION, AND PULSE GENERATOR;  Surgeon: Unice Pac, MD;  Location: [facility] OR;  Service: Neurosurgery;  Laterality: N/A;   SUBTHALAMIC STIMULATOR BATTERY REPLACEMENT Right 02/23/2022   Procedure: CHANGE IPG BATTERY, RT CHEST;  Surgeon: Dawley, Lani JAYSON, DO;  Location: [facility] OR;  Service: Neurosurgery;  Laterality: Right;   SUBTHALAMIC STIMULATOR INSERTION Left 01/16/2019   Procedure: Left deep brain stimulator electrode revision.;  Surgeon: Unice Pac, MD;  Location: [facility] OR;  Service: Neurosurgery;  Laterality: Left;   SUBTHALAMIC STIMULATOR INSERTION Left 01/23/2019   Procedure: Repositioning of Left Deep Brain Stimulator Electrode;  Surgeon: Unice Pac, MD;  Location: [facility] OR;  Service: Neurosurgery;  Laterality: Left;   TONSILLECTOMY     Patient Active Problem List   Diagnosis Date Noted   Melena 06/01/2022   Acute cystitis without hematuria 05/31/2022   Upper GI bleed 05/31/2022   Generalized weakness 05/30/2022   Abdominal pain 05/30/2022  OSA (obstructive sleep apnea) 05/30/2022   Hypothyroidism 05/30/2022   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) 12/14/2021   Mixed hyperlipidemia 12/14/2021   Essential hypertension 12/14/2021   Palpitations 12/14/2021   Angina pectoris (HCC) 12/14/2021   Urge incontinence 01/15/2021   History of UTI 01/15/2021   Abnormal urine findings 01/15/2021   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   History of nephrolithiasis 01/15/2021   DVT, lower extremity, proximal, acute, right (HCC)  02/26/2020   Open scalp wound 01/22/2020   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HCC)    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   Parkinson's disease (HCC) 01/16/2018   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) 01/16/2018   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    ONSET DATE: 07/28/23- referral date  REFERRING DIAG:  Diagnosis  Z74.1 (ICD-10-CM) - Requires assistance with activities of daily living (ADL)  G20.B2 (ICD-10-CM) - Parkinson's disease with dyskinesia and fluctuating manifestations (HCC)    THERAPY DIAG:  Other lack of coordination  Other abnormalities of gait and mobility  Other symptoms and signs involving the nervous system  Attention and concentration deficit  Frontal lobe and executive function deficit  Rationale for Evaluation and Treatment: Rehabilitation  SUBJECTIVE:   SUBJECTIVE STATEMENT: Pt reports it's not a great day Pt accompanied by: self  PERTINENT HISTORY: parkinson's diagnosed greater than 10 yrs ago, deep brain stimulator R, had in L as well but had to be removed for various reasons. Depression, sleep apnea, hx of back surgery, see hx above walks around neighborhood 3 x week with aide Tuesdays and Thursdays parkisons bicycle class 2 x Drue training for upper body work out   PRECAUTIONS: Fall- PT is addressing  WEIGHT BEARING RESTRICTIONS: No  PAIN:  Are you having pain? Yes: NPRS scale: 3-5/10, generalized Pain location: back, hip Pain description: aching Aggravating factors: malposiitoning Relieving factors: rest,   FALLS: Has patient fallen in last 6 months? No  LIVING ENVIRONMENT: Lives with: lives with their spouse, has CNA 3x weeks Lives in: House/apartment Stairs: Yes: Internal: has stairt lift, and coverted garage appt on ground level Has following equipment at home: U-step  PLOF: Needs assistance with ADLs and Needs assistance with homemaking  PATIENT GOALS: Pt wants to be able to tie shoes, and more I with dressing  OBJECTIVE:  Note:  Objective measures were completed at Evaluation unless otherwise noted.  HAND DOMINANCE: Right  ADLs: Overall ADLs: increased time required  Transfers/ambulation related to ADLs: Eating: difficulty cutting food, needs assist  Grooming: mod I with brushing teeth, combing, CNA assists with shaving UB Dressing: mod I LB Dressing: mod-max A for LB dressing Toileting: mod I with U-step Bathing: supervision-walk in shower, level has built in seat and grab bars Tub Shower transfers: min A Equipment: Grab bars and level walk in shower with built in bench  IADLs:Dependent with home management  Medication management: wife assists Financial management: wife handles Handwriting: Moderate micrographia at sentence level  MOBILITY STATUS: mod I with U-step  POSTURE COMMENTS:  rounded shoulders and forward head   FUNCTIONAL OUTCOME MEASURES: Fastening/unfastening 3 buttons: 1 min 43 secs   COORDINATION: 9 Hole Peg test: Right: 55.08 sec; Left: 49.17 sec Box and Blocks:  Right 26blocks, Left 34blocks  UE ROM:  A/ROM:shoulder flexion RUE-100 LUE105, elbow extension RUE-35 LUE -25, decreased bilateral supination, RUE 95% finger extension, maintains fingers flexed    SENSATION: WFL    COGNITION: Overall cognitive status: Impaired, slowed processing and short term memory deficits  OBSERVATIONS: Bradykinesia and resting tremor RUE, dystonia RUE  TREATMENT DATE:10/03/23- Therapist checked several goals in prep for PN. see below. UBE x 6 mins level 1 for conditioning Standing at countertop Modified quadraped PWR! rock, min facilitation/ v.c  then PWR! rock stepping forwards and backwards at countertop min A PWR! hands PWR! up x 10 reps, followed by placing metal pegs into pegboard with right and left UW's min difficulty, increased time and min v.c for flicks. Pt took PD meds  during session, todays' session was impacted by being and off time prior to meds.   09/26/23- UBE x 6 mins level 1  Supine PWR! up, with mod facilitation, PWR! twist rolling like a log due to history of back surgery. this was discontinuesd as pt had back and hip pain. PWR! step with bridge x 6 reps to simulate bed mobility, then rolling to side lying to sit up, min-mod facilitation/ v.c Modified quadraped PWR! rock, min facilitation/ v.c  dynamic step and reach at countertop min v.c for amplitude. stepping forwards and backwards at countertop min A Flipping and dealing playing cards for increased fine motor coordiantion, mod difficulty, v.c with RUE due to rigidity/ spasticity.  09/14/23 UBE x 5 mins level 1 for conditioning Standing at counter PWR! rock x 20 reps to targets, modified quadraped PWR! rock x 10 reps, min v.c and facilitation for amplitude seated PWR! up x 10 reps, v.c and demonstration Reviewed PWR! hands for PWR! up, rock, twist and step followed by fatening buttons with big movements mod v.c and demonstration. Therapist started checking short term goals. see goals below. 09/12/23 UBE x 6 mins level 1 Standing at counter PWR! rock x 20 reps to targets, modified quadraped PWR! rock x 10 reps, min v.c and facilitation for amplitude Dynamic step and reach flipping large playing cards, min v.c and facilitation Stepping to targets with big movements, mod v.c fo amplitude   09/07/23 PWR! rock modified quadraped and standing x 10 reps min v.c UBE x 6 mins level 1 for conditioning Functional reaching in standing alternating between left and right hands to place large pegs in vertical pegboard, min v.c for amplitude PWR! hands for PWR! up, rock  and twist 10 reps each min v.c for amplitude Flipping and dealing playing cards with left and right UE's, min difficulty with left, min-mod for RUE, v.c for big movements   08/31/23 PWr! hands basic 4 seated at table followed by flipping cards  with big movments, mod v.c and demonstration. Standing at sink, modified quadraped PWR! rock, then standing PWR! rock x 10 reps each, Marching in place with big movements, min-mod v.c for amplitude, then stepping back with alternate feet holding a chair back, then out to the side x 10 reps, min v.c and supervision. Dynamic step and reach to retrieve replace items in cabinets with left and right UE's mod v.c fo amplitude Wiping cabinets for wall slides to promote shoulder flexion. UBE x 5 mins level 1 for conditioning.   08/29/23- UBE x 6 mins level 1 for conditioning, pt mainatined 40 rpm PWR! up in seated, x 10 reps attempted PWR! rock seated but it was uncomfortable for back so discontiinued. Dynamic step and reach to flip large playing cards at countertop, to left and right sides, then at an angle behind (like PWR! step in standing) mod v.c for amplitude and foot placement  PWR! hands followed by placing metal pegs 3 sizes into pegboard with RUE and removing with in hand manipulation, min-mod difficulty/ v.c for amplitude and flicks. Increased time required  08/24/23-PWR!modified quadraped rock at [employer] then standing PWR! rock to targets, min-mod v.c for amplitude, min facilitation Seated eedge of met, Doffing then donning sock and shoes with big movments, mod v.c and min-mod facillitaion. Pt tried sock aide to donn left sock, hoever ift was easier to donn right sock manually without the sock aide. Increased time required  Pt attempted to fasten buttons on tabletop, however max difficulty despite v.c so task was discontinued. PWR! hands, followed by picking up coins to place in coin slot, min difficulty v.c  08/22/23- UBE x 5 mins level 1 for conditioning. PWR! up rock and step in seated, mod v.c and demonstraiton. PWR! rock in standing min v.c 10-20 reps each. Marching in standing at the sink, min v.c for amplitude. See pt education  08/16/23- eval only    PATIENT EDUCATION: Education  details:  see above Person educated: Patient,  Education method: Explanation demonstration, v.c Education comprehension: verbalized understanding, returned demonstration, v.c  HOME EXERCISE PROGRAM: PWR! hands, beginning coordination-08/22/23  GOALS: Goals reviewed with patient? Yes  SHORT TERM GOALS: Target date: 09/14/23   I with PD specific HEP  Goal status: ongoing- issued needs reinforcement 10/03/23  2.  Pt will demonstrate understanding of adapted strategies/ AE to increase safety and I with ADLs/IADLs. Goal status:  ongoing, initiated  needs reinforcement 10/03/23  3.  Pt will demonstrate improved bilateral UE use as evidenced by increasing box and blocks score by 3 blocks bilaterally.   Goal status: RUE 40 blocks,  LUE 39 blocks, goal met 09/14/23  4.  Pt will demonstrate improved bilateral UE functional use and ease with dressing as evidenced by decreasing 3 button/ unbutton test time by 5 secs.  Goal status: ongoing >3 mins, 10/03/23  5.  Pt will demonstrate ability to retrieve a lightweight object at 105 shoulder flexion and -30 elbow extension with RUE.  Goal status:  met, 125, -10 10/03/23  6.  Pt will demonstrate ability to retrieve a lightweight object at 110 shoulder flexion and -20 elbow extension with LUE  Goal status:met, 125, -20 10/03/23  LONG TERM GOALS: Target date: 11/08/23   Pt will demonstrate improved fine motor coordination for ADLs as evidenced by decreasing 9 hole peg test score for RUE by 4 secs  Goal status: INITIAL  2.  Pt will demonstrate improved fine motor coordination for ADLs as evidenced by decreasing 9 hole peg test score for LUE by 4 secs  Goal status: INITIAL   3..  Pt will perfrom PPT#4 in 1 min15 secs or less without assist or LOB.  Baseline: 1 min 23 secs with min A- 09/14/23          Goal status; new 09/14/23   4.  Pt will verbalize understanding of ways to prevent future PD related complications  Goal status: INITIAL  5.  Pt will  write a short paragraph with 100% legibility and minimal decrease in letter size  Goal status: INITIAL  6. Pt will perform PPT#2 in 15 secs or less Baseline: 17.02 Goal status: initial  ASSESSMENT:  CLINICAL IMPRESSION: For the reporting period of: 08/16/23-10/03/23.  Patient is progressing towards goals. He has achieved 3/6 short term goals and he is progressing towards all long term goals.SABRA He can benefit from continued skilled occupational therapy to address: bradykinesia, rigidity, decreased coordination, decreased balance and decreased functional mobility in order to maximize pt's safety and I with ADLs/IADLs.SABRA Performance deficits:ADLs, IADLs, coordination, dexterity, tone, ROM, strength, flexibility, Fine motor control, Gross motor  control, mobility, balance, endurance, decreased knowledge of precautions, decreased knowledge of use of DME, vision, and UE functional use, cognitive skills including attention, memory, problem solving, safety awareness, sequencing, and thought, and psychosocial skills including coping strategies, environmental adaptation, habits, interpersonal interactions, and routines and behaviors.   IMPAIRMENTS: are limiting patient from ADLs, IADLs, rest and sleep, play, leisure, and social participation.   COMORBIDITIES:  may have co-morbidities  that affects occupational performance. Patient will benefit from skilled OT to address above impairments and improve overall function.  MODIFICATION OR ASSISTANCE TO COMPLETE EVALUATION: Min-Moderate modification of tasks or assist with assess necessary to complete an evaluation.  OT OCCUPATIONAL PROFILE AND HISTORY: Detailed assessment: Review of records and additional review of physical, cognitive, psychosocial history related to current functional performance.  CLINICAL DECISION MAKING: Moderate - several treatment options, min-mod task modification necessary  REHAB POTENTIAL: Good  EVALUATION COMPLEXITY:  Low    PLAN:  OT FREQUENCY: 2x/week  OT DURATION: 12 weeks  PLANNED INTERVENTIONS: 97168 OT Re-evaluation, 97535 self care/ADL training, 02889 therapeutic exercise, 97530 therapeutic activity, 97112 neuromuscular re-education, 97140 manual therapy, 97116 gait training, 02886 aquatic therapy, 97035 ultrasound, 97018 paraffin, 02989 moist heat, 97010 cryotherapy, 97034 contrast bath, 97129 Cognitive training (first 15 min), 02869 Cognitive training(each additional 15 min), passive range of motion, balance training, functional mobility training, visual/perceptual remediation/compensation, energy conservation, coping strategies training, patient/family education, and DME and/or AE instructions  RECOMMENDED OTHER SERVICES: n/a  CONSULTED AND AGREED WITH PLAN OF CARE: Patient  PLAN FOR NEXT SESSION:  dynamic step and reach, reinforce coordination HEP, consider bag exercises.   Elida Harbin, OT 10/03/2023, 2:21 PM

## 2023-10-04 ENCOUNTER — Encounter: Payer: Self-pay | Admitting: Occupational Therapy

## 2023-10-04 NOTE — Therapy (Signed)
 OUTPATIENT PHYSICAL THERAPY NEURO TREATMENT    Patient Name: Patrick Ross MRN: [id number] DOB:1945/10/16, 78 y.o., male Today's Date: 10/05/2023   PCP: Millicent Sharper MD REFERRING PROVIDER: Evonnie Asberry RAMAN DO  END OF SESSION:  PT End of Session - 10/05/23 1447     Visit Number 17    Date for PT Re-Evaluation 10/31/23    Authorization Type Medicare    Progress Note Due on Visit 20    PT Start Time 1445    PT Stop Time 1530    PT Time Calculation (min) 45 min    Activity Tolerance Patient tolerated treatment well    Behavior During Therapy WFL for tasks assessed/performed                 Past Medical History:  Diagnosis Date   Depression    DVT (deep venous thrombosis) (HCC)    GERD (gastroesophageal reflux disease)    History of kidney stones    passed   Hypertension    Hypothyroidism    Lumbar stenosis    OSA (obstructive sleep apnea)    wears cpap   Parkinson's disease (HCC)    PONV (postoperative nausea and vomiting)    REM behavioral disorder    Urinary incontinence    Past Surgical History:  Procedure Laterality Date   BACK SURGERY     x 2 - not fusion   BIOPSY  06/01/2022   Procedure: BIOPSY;  Surgeon: Dianna Specking, MD;  Location: [facility];  Service: Gastroenterology;;   COLONOSCOPY     DEEP BRAIN STIMULATOR PLACEMENT     left 12/27/2012, right 12/10/2011, left revision 02/02/2013, replacement bilateral 11/21/2015   ESOPHAGOGASTRODUODENOSCOPY (EGD) WITH PROPOFOL  N/A 06/01/2022   Procedure: ESOPHAGOGASTRODUODENOSCOPY (EGD) WITH PROPOFOL ;  Surgeon: Dianna Specking, MD;  Location: [facility];  Service: Gastroenterology;  Laterality: N/A;   MINOR PLACEMENT OF FIDUCIAL N/A 01/09/2019   Procedure: Fiducial placement;  Surgeon: Unice Pac, MD;  Location: [facility] OR;  Service: Neurosurgery;  Laterality: N/A;  Fiducial placement   PARTIAL NEPHRECTOMY Right 2005 ish   PULSE GENERATOR IMPLANT Right 01/23/2019   Procedure: Right chest implantable  pulse generator change;  Surgeon: Unice Pac, MD;  Location: [facility] OR;  Service: Neurosurgery;  Laterality: Right;   SPINAL FUSION     lumbar Fusion x 2   SUBTHALAMIC STIMULATOR BATTERY REPLACEMENT Left 02/02/2018   Procedure: Change implantable pulse generator battery, Left chest;  Surgeon: Unice Pac, MD;  Location: [facility] OR;  Service: Neurosurgery;  Laterality: Left;   SUBTHALAMIC STIMULATOR BATTERY REPLACEMENT N/A 01/22/2020   Procedure: REMOVAL OF LEFT BRAIN DEEP BRAIN STIMULATOR ELECTRODE, EXTENSION, AND PULSE GENERATOR;  Surgeon: Unice Pac, MD;  Location: [facility] OR;  Service: Neurosurgery;  Laterality: N/A;   SUBTHALAMIC STIMULATOR BATTERY REPLACEMENT Right 02/23/2022   Procedure: CHANGE IPG BATTERY, RT CHEST;  Surgeon: Dawley, Lani JAYSON, DO;  Location: [facility] OR;  Service: Neurosurgery;  Laterality: Right;   SUBTHALAMIC STIMULATOR INSERTION Left 01/16/2019   Procedure: Left deep brain stimulator electrode revision.;  Surgeon: Unice Pac, MD;  Location: [facility] OR;  Service: Neurosurgery;  Laterality: Left;   SUBTHALAMIC STIMULATOR INSERTION Left 01/23/2019   Procedure: Repositioning of Left Deep Brain Stimulator Electrode;  Surgeon: Unice Pac, MD;  Location: [facility] OR;  Service: Neurosurgery;  Laterality: Left;   TONSILLECTOMY     Patient Active Problem List   Diagnosis Date Noted   Melena 06/01/2022   Acute cystitis without hematuria 05/31/2022   Upper GI bleed 05/31/2022   Generalized  weakness 05/30/2022   Abdominal pain 05/30/2022   OSA (obstructive sleep apnea) 05/30/2022   Hypothyroidism 05/30/2022   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) 12/14/2021   Mixed hyperlipidemia 12/14/2021   Essential hypertension 12/14/2021   Palpitations 12/14/2021   Angina pectoris (HCC) 12/14/2021   Urge incontinence 01/15/2021   History of UTI 01/15/2021   Abnormal urine findings 01/15/2021   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   History of nephrolithiasis 01/15/2021   DVT, lower  extremity, proximal, acute, right (HCC) 02/26/2020   Open scalp wound 01/22/2020   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HCC)    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   Parkinson's disease (HCC) 01/16/2018   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) 01/16/2018   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    ONSET DATE: exacerbation 06/21/23, Parkinson's diagnosis 2010  REFERRING DIAG: parkinsons disease with fluctuating dyskinesia  THERAPY DIAG:  Parkinson's disease with dyskinesia, with fluctuations (HCC)  Other lack of coordination  Other abnormalities of gait and mobility  Other symptoms and signs involving the nervous system  Difficulty in walking, not elsewhere classified  Rationale for Evaluation and Treatment: Rehabilitation  SUBJECTIVE:                                                                                                                                                                                             SUBJECTIVE STATEMENT: I fell in the kitchen trying to pick up something off the floor, yesterday.     EVAL-Pt and his wife report long history of parkinson's,  biggest difficulty with his mobility that they note is R lower back  pain and labored supine to sit movement, also poor foot clearance.  He doesn't like relying on the walker Pt accompanied by: significant other  PERTINENT HISTORY: parkinson's diagnosed greater than 10 yrs ago, deep brain stimulator R, had in L as well but had to be removed for various reasons.  walks around neighborhood 3 x week with aide Tuesdays and Thursdays parkisons bicycle class 2 x Drue training for upper body work out Referred to PT and ST by neurologist   PAIN:  Are you having pain? R lower back pain constant  PRECAUTIONS: Fall and Other: deep brain stimulator on R   RED FLAGS: None   WEIGHT BEARING RESTRICTIONS: No  FALLS: Has patient fallen in last 6 months? No  LIVING ENVIRONMENT: Lives with: lives with their spouse Lives in:  House/apartment Stairs: has internal steps, but has had home renovated and adapted so the he and his wife live in downstairs apartment, all one level with bathroom and kitchen Has following equipment at home: Single point cane, Environmental consultant -  4 wheeled, Shower bench, Grab bars, and Ramped entry  PLOF: Independent  PATIENT GOALS: improve bed mobility and improve gait, stability, avoid falls  OBJECTIVE:  Note: Objective measures were completed at Evaluation unless otherwise noted.  DIAGNOSTIC FINDINGS: see neuro notes, has deep brain stimulator R   COGNITION: Overall cognitive status: Within functional limits for tasks assessed   SENSATION: WFL  COORDINATION: Slowed movements B and noted curling R hand and R foot at times, with gait festinating on turns   EDEMA:  None noted  MUSCLE TONE: noted curling R fingers, toes, knee flex and hip flex at times, randomly during evaluation  MUSCLE LENGTH: Hamstrings: Right -30 deg; Left -30 deg Thomas test: Right nt deg; Left nt deg    POSTURE: Standing with some forward lean, weight on forefeet, L calf atrophied pt and wife report chronic after L lumbar surgery.     LOWER EXTREMITY ROM:   grossly wfl, hips not assessed as not positioned on mat today   LOWER EXTREMITY MMT:    MMT Right Eval Left Eval  Hip flexion 4 4-  Hip extension    Hip abduction    Hip adduction    Hip internal rotation    Hip external rotation    Knee flexion 4 4  Knee extension 4 4-  Ankle dorsiflexion 4 3+  Ankle plantarflexion 4- 3-  Ankle inversion    Ankle eversion    (Blank rows = not tested)  BED MOBILITY:  Not tested  TRANSFERS: I but slowed, uses hands on seat or with rollator  GAIT: Findings: Gait Characteristics: step to pattern, decreased step length- Right, decreased step length- Left, decreased stride length, decreased hip/knee flexion- Right, decreased hip/knee flexion- Left, shuffling, festinating, trunk flexed, and narrow BOS,  Distance walked: 100', Assistive device utilized:Walker - 4 wheeled, Level of assistance: SBA, and Comments: festinating noted on turns, tends to turn L  FUNCTIONAL TESTS:  30 seconds chair stand test 6 reps Timed up and go (TUG): 33.39 with rollator Standing static feet together 15 sec Standing semi tandem 10 sec each leg, needed CGA with L foot forward Standing marching, required B hand hold support to maintain balance                                                                                                                              TREATMENT DATE: 10/05/23 In bars reaching across body to grab cones at high and low surfaces NuStep L5 x59mins  Blue ball slams 2x10 Bouncing ball  STS 2x8   10/03/23:  Nustep L 5 x 6 min In ll bars for lunges on rounded side of BOSU, mass practice In ll bars with one foot on BOSU and reaches down to target, cone, with one hand pt reached within 2 of cone, needed mod assist to return to upright standing  Prone lying, therapist provided post hip stabilization with overpressure for knee flexion stretch, very stiff R LE ,  needed mod assist to return to upright sitting.   [employer] with blue ball mass practice Standing for ball toss mass practice Seated forward, side/ side , rotational stretch with largest ball    09/29/23 NuStep L5x46mins  On airex marching with RW STS with red ball to floor and overhead 2x8 Standing ball toss and bounces  Seated pball rolls forward and rotations x10   09/26/23 Recheck goals -30s chair test 11 reps -TUG 20s  NuStep L5 x35mins  Standing rows and ext in front of mat table 2x10 STS w/ball toss 2x10 2# boxing punches  Standing marches 3# Standing hip abd 3#   09/22/23 NuStep L5x71mins  OHP alternating 2# 2x10 Arm swings 2# 2x10 STS yellow ball chest press 2x10 Leg ext 10# 3x10 HS curls 25# 3x10 Ball slams with blue ball 2x10   09/19/23:  Prone for ant hip/trunk stretch , therapist assisting with alt  knee flexion, R very stiff Prone with roll under ankles, for TKE's 5 sec holds, 10 x 2 Standing in front of mat alt arm swings high amplitude 2# wts x 15 reps each Standing for medicine ball bounces to floor with 2 KG yellow mediball mass practice Sit to stand from hi low table with yellow mediball 10x Nustep L 5 Ue's and LE's x 6 min  08/25/23 NuStep L5x72mins  Leg ext 10# 2x10 HS curls 25# 2x10 Fitter pushes 1 blue and 1 black band 2x10 STS 2x10 Arm swings holding 1 and 2# WaTe 20s x2    08/23/23 Recheck goals - 5xSTS - TUG - MMT NuStep L5x36mins  Modified sit ups holding yellow 2x10 Box tap 6 Step ups 6 Balance on airex feet together, eyes open 220s and then closed 10   08/18/23 Leg ext 10# 2x10 HS curls 25# 2x10 OHP press yellow ball 2x10 Supine LE stretching- HS, LTR, SKTC  NuStep L5 x24mins    08/16/23 Holding big red ball tapping floor to overhead to side to side x5 Standing shoulder rows and ext green 2x10 Standing shoulder flexion with 2# WaTE 2x10 Box taps 4 holding on and then without walker CGA-minA  NuStep L5x73mins STS 2x10   08/11/23 NuStep L5x3mins  Fitter pushes 2 blue bands 2x10  Side steps over obstacles in bars  On airex reaching for numbers  Isometric ab push downs 2x10   08/09/23 NuStep L5x62mins  STS with chest press red ball 2x8 Standing march 3#  3# hip abd 2x10  Horizontal abd green 2x10 Leg ext 10# 2x10 HS curls 25# 2x10    08/04/23 30s chair test TUG 27s  MMT 4/5 Standing shoulder ext and rows green band 2x10 Standing shoulder flexion 2# WaTE x10 - stopped due to feeling dizzy and off balance  NuStep L5 x90mins  Seated rotation with yellow ball 2x10   08/02/23 NuStep L5x27mins  Side steps over obstacles in bars Forwards steps over obstacles in bars Calf raises x10 Box taps 4 1HRA Step ups 4 2HRA  STS with OHP 2x5  07/28/23 Supine HS stretch and SKTC  Lower trunk rotations x10 SLR 2x10 Feet on pball rotations and  knees to chest AB isometric holds x10  NuStep L5x27mins  STS 3x5   07/27/23 NuStep L5x16mins  LAQ 2# 2x10 HS curls green 2x10  Hip abduction green 2x10 STS 2x10  Stretching with pball rotations and flexion x5     07/06/23 Evaluation, advised to attempt prone lying on bed 10 to 15 min  to stretch body into extension( pt  doesn't ever lie prone)   PATIENT EDUCATION: Education details: POC, goals Person educated: Patient and Spouse Education method: Explanation, Demonstration, Tactile cues, and Verbal cues Education comprehension: verbalized understanding, verbal cues required, and needs further education  HOME EXERCISE PROGRAM: Access Code: [email or web address] URL: [email or web address] Date: 08/02/2023 Prepared by: Almetta Fam  Exercises - Supine Hamstring Stretch with Strap  - 2 x daily - 7 x weekly - 2 reps - 15 hold - Modified Thomas Stretch  - 2 x daily - 7 x weekly - 2 reps - 15 hold - Supine Bridge  - 1 x daily - 7 x weekly - 2 sets - 10 reps - Standing Shoulder Horizontal Abduction with Resistance  - 1 x daily - 7 x weekly - 2 sets - 10 reps  GOALS: Goals reviewed with patient? Yes  SHORT TERM GOALS: Target date: 4 weeks, 08/03/23  I home program for posture and for hamstring, hip flexor stretchig Baseline: Goal status: ongoing 08/02/23, ongoing 08/23/23  LONG TERM GOALS: Target date: 10/31/23  30 sec sit to stand improve from 6 reps to 12 reps for improved coordination, strength, endurance LEs Baseline:  Goal status: 7 reps ongoing 08/04/23, 8 reps 08/23/23, 11 reps 09/26/23  2.  I and pain free supine to sit motion , rolling to R Baseline: very painful per pt and wife history, not specifically assessed in clinic today Goal status: ongoing 07/28/23, ongoing 08/09/23, still has pain with bed mobility 08/23/23, can do but still painful 09/26/23  3.  TUG score improve from 33 sec to 18 or less for reduced fall risk   Baseline:  Goal status: 27s ongoing 08/04/23, 22s 08/23/23,  20s 09/26/23  4.  Improve strength LE's for B quads, hamstrings to 4+5 for improved functional strength Baseline: 4- Goal status: 4/5 08/04/23, 5/5 MET 08/23/23   ASSESSMENT:  CLINICAL IMPRESSION: Worked on reaching across body at different heights in bars today. Lower surface items were hard for him to reach, need minA or 1 HHA on bars. LOB backwards with ball dribbling. He tends to shift weight on to heels when standing.    EVAL Patient is a 78 y.o. male who was evaluated today by physical therapy due to referral from his neurologist, with decline in function, particularly control of R extremities, with his Parkinson's diagnosis.  He and his wife have been proactive about maintaining his function, he attends a training/ strengthening session locally 2 x week , attends parkinson's cycling weekly, and walks around 0.6 miles with his aide 3 x week outdoors with his rollator.  They recently have adapted their home with a main floor renovation so that their bedroom, bath, kitchen are all on one level.   Main problems identified today are weakness B LE's, L ankle is weak chronically due to lumbar radiculopathy and B knee, hips musculature is weak.  He also has coordination deficits with festinating gait particularly on turns and static / dynamic balance deficits.  Has chronic R sided lower back pain which is particularly intense with moving supine to sit.  He should benefit from physical therapy intervention to address his deficits and update his current routine for stretching and strengthening at home.    OBJECTIVE IMPAIRMENTS: Abnormal gait, decreased activity tolerance, decreased balance, decreased coordination, decreased endurance, decreased mobility, difficulty walking, decreased strength, hypomobility, impaired perceived functional ability, impaired UE functional use, postural dysfunction, and pain.   ACTIVITY LIMITATIONS: carrying, lifting, squatting, stairs, transfers, and locomotion  level  PARTICIPATION LIMITATIONS: meal prep, cleaning,  laundry, shopping, community activity, and church  PERSONAL FACTORS: Age, Behavior pattern, Fitness, Past/current experiences, Time since onset of injury/illness/exacerbation, and 1-2 comorbidities: h/o L lumbar radiculopathy and weakness L LE, chronic R sided lower back pain, are also affecting patient's functional outcome.   REHAB POTENTIAL: Good  CLINICAL DECISION MAKING: Evolving/moderate complexity  EVALUATION COMPLEXITY: Moderate  PLAN:  PT FREQUENCY: 2x/week  PT DURATION: 12 weeks  PLANNED INTERVENTIONS: 97110-Therapeutic exercises, 97530- Therapeutic activity, [id number]- Neuromuscular re-education, 97535- Self Care, and [id number]- Manual therapy  PLAN FOR NEXT SESSION: continue with balance, stretching, coordination activites   Almetta Fam, PT, DPT 10/05/2023, 3:34 PM

## 2023-10-05 ENCOUNTER — Ambulatory Visit: Admitting: Occupational Therapy

## 2023-10-05 ENCOUNTER — Ambulatory Visit

## 2023-10-05 DIAGNOSIS — R41844 Frontal lobe and executive function deficit: Secondary | ICD-10-CM

## 2023-10-05 DIAGNOSIS — G20B2 Parkinson's disease with dyskinesia, with fluctuations: Secondary | ICD-10-CM

## 2023-10-05 DIAGNOSIS — R41840 Attention and concentration deficit: Secondary | ICD-10-CM

## 2023-10-05 DIAGNOSIS — R2689 Other abnormalities of gait and mobility: Secondary | ICD-10-CM

## 2023-10-05 DIAGNOSIS — R278 Other lack of coordination: Secondary | ICD-10-CM | POA: Diagnosis not present

## 2023-10-05 DIAGNOSIS — R29818 Other symptoms and signs involving the nervous system: Secondary | ICD-10-CM

## 2023-10-05 DIAGNOSIS — R262 Difficulty in walking, not elsewhere classified: Secondary | ICD-10-CM

## 2023-10-05 NOTE — Therapy (Signed)
 OUTPATIENT OCCUPATIONAL THERAPY [facility] treatment  Patient Name: Patrick Ross MRN: [id number] DOB:[birth year], 78 y.o., male Today's Date: 10/05/2023  PCP: Dr. Millicent REFERRING PROVIDER:Dr. Tat  END OF SESSION:  OT End of Session - 10/05/23 1713     Visit Number 11    Number of Visits 25    Date for OT Re-Evaluation 11/08/23    Authorization Type MCR, [employer]    Authorization Time Period 12 weeks, anticipate d/c after 8 weeks    Authorization - Visit Number 11    Progress Note Due on Visit 20    OT Start Time 1533    OT Stop Time 1615    OT Time Calculation (min) 42 min               Past Medical History:  Diagnosis Date   Depression    DVT (deep venous thrombosis) (HCC)    GERD (gastroesophageal reflux disease)    History of kidney stones    passed   Hypertension    Hypothyroidism    Lumbar stenosis    OSA (obstructive sleep apnea)    wears cpap   Parkinson's disease (HCC)    PONV (postoperative nausea and vomiting)    REM behavioral disorder    Urinary incontinence    Past Surgical History:  Procedure Laterality Date   BACK SURGERY     x 2 - not fusion   BIOPSY  06/01/2022   Procedure: BIOPSY;  Surgeon: Dianna Specking, MD;  Location: [facility];  Service: Gastroenterology;;   COLONOSCOPY     DEEP BRAIN STIMULATOR PLACEMENT     left 12/27/2012, right 12/10/2011, left revision 02/02/2013, replacement bilateral 11/21/2015   ESOPHAGOGASTRODUODENOSCOPY (EGD) WITH PROPOFOL  N/A 06/01/2022   Procedure: ESOPHAGOGASTRODUODENOSCOPY (EGD) WITH PROPOFOL ;  Surgeon: Dianna Specking, MD;  Location: [facility];  Service: Gastroenterology;  Laterality: N/A;   MINOR PLACEMENT OF FIDUCIAL N/A 01/09/2019   Procedure: Fiducial placement;  Surgeon: Unice Pac, MD;  Location: [facility] OR;  Service: Neurosurgery;  Laterality: N/A;  Fiducial placement   PARTIAL NEPHRECTOMY Right 2005 ish   PULSE GENERATOR IMPLANT Right 01/23/2019   Procedure: Right chest implantable pulse  generator change;  Surgeon: Unice Pac, MD;  Location: [facility] OR;  Service: Neurosurgery;  Laterality: Right;   SPINAL FUSION     lumbar Fusion x 2   SUBTHALAMIC STIMULATOR BATTERY REPLACEMENT Left 02/02/2018   Procedure: Change implantable pulse generator battery, Left chest;  Surgeon: Unice Pac, MD;  Location: [facility] OR;  Service: Neurosurgery;  Laterality: Left;   SUBTHALAMIC STIMULATOR BATTERY REPLACEMENT N/A 01/22/2020   Procedure: REMOVAL OF LEFT BRAIN DEEP BRAIN STIMULATOR ELECTRODE, EXTENSION, AND PULSE GENERATOR;  Surgeon: Unice Pac, MD;  Location: [facility] OR;  Service: Neurosurgery;  Laterality: N/A;   SUBTHALAMIC STIMULATOR BATTERY REPLACEMENT Right 02/23/2022   Procedure: CHANGE IPG BATTERY, RT CHEST;  Surgeon: Dawley, Lani JAYSON, DO;  Location: [facility] OR;  Service: Neurosurgery;  Laterality: Right;   SUBTHALAMIC STIMULATOR INSERTION Left 01/16/2019   Procedure: Left deep brain stimulator electrode revision.;  Surgeon: Unice Pac, MD;  Location: [facility] OR;  Service: Neurosurgery;  Laterality: Left;   SUBTHALAMIC STIMULATOR INSERTION Left 01/23/2019   Procedure: Repositioning of Left Deep Brain Stimulator Electrode;  Surgeon: Unice Pac, MD;  Location: [facility] OR;  Service: Neurosurgery;  Laterality: Left;   TONSILLECTOMY     Patient Active Problem List   Diagnosis Date Noted   Melena 06/01/2022   Acute cystitis without hematuria 05/31/2022   Upper GI bleed 05/31/2022  Generalized weakness 05/30/2022   Abdominal pain 05/30/2022   OSA (obstructive sleep apnea) 05/30/2022   Hypothyroidism 05/30/2022   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) 12/14/2021   Mixed hyperlipidemia 12/14/2021   Essential hypertension 12/14/2021   Palpitations 12/14/2021   Angina pectoris (HCC) 12/14/2021   Urge incontinence 01/15/2021   History of UTI 01/15/2021   Abnormal urine findings 01/15/2021   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   History of nephrolithiasis 01/15/2021   DVT, lower extremity,  proximal, acute, right (HCC) 02/26/2020   Open scalp wound 01/22/2020   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HCC)    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   Parkinson's disease (HCC) 01/16/2018   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) 01/16/2018   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    ONSET DATE: 07/28/23- referral date  REFERRING DIAG:  Diagnosis  Z74.1 (ICD-10-CM) - Requires assistance with activities of daily living (ADL)  G20.B2 (ICD-10-CM) - Parkinson's disease with dyskinesia and fluctuating manifestations (HCC)    THERAPY DIAG:  Other lack of coordination  Other abnormalities of gait and mobility  Other symptoms and signs involving the nervous system  Attention and concentration deficit  Frontal lobe and executive function deficit  Rationale for Evaluation and Treatment: Rehabilitation  SUBJECTIVE:   SUBJECTIVE STATEMENT: Pt reports it's not a great day with the weather Pt accompanied by: self  PERTINENT HISTORY: parkinson's diagnosed greater than 10 yrs ago, deep brain stimulator R, had in L as well but had to be removed for various reasons. Depression, sleep apnea, hx of back surgery, see hx above walks around neighborhood 3 x week with aide Tuesdays and Thursdays parkisons bicycle class 2 x Drue training for upper body work out   PRECAUTIONS: Fall- PT is addressing  WEIGHT BEARING RESTRICTIONS: No  PAIN:  Are you having pain? Yes: NPRS scale: 3-5/10, generalized Pain location: back, hip Pain description: aching Aggravating factors: malposiitoning Relieving factors: rest,   FALLS: Has patient fallen in last 6 months? No  LIVING ENVIRONMENT: Lives with: lives with their spouse, has CNA 3x weeks Lives in: House/apartment Stairs: Yes: Internal: has stairt lift, and coverted garage appt on ground level Has following equipment at home: U-step  PLOF: Needs assistance with ADLs and Needs assistance with homemaking  PATIENT GOALS: Pt wants to be able to tie shoes,  and more I with dressing  OBJECTIVE:  Note: Objective measures were completed at Evaluation unless otherwise noted.  HAND DOMINANCE: Right  ADLs: Overall ADLs: increased time required  Transfers/ambulation related to ADLs: Eating: difficulty cutting food, needs assist  Grooming: mod I with brushing teeth, combing, CNA assists with shaving UB Dressing: mod I LB Dressing: mod-max A for LB dressing Toileting: mod I with U-step Bathing: supervision-walk in shower, level has built in seat and grab bars Tub Shower transfers: min A Equipment: Grab bars and level walk in shower with built in bench  IADLs:Dependent with home management  Medication management: wife assists Financial management: wife handles Handwriting: Moderate micrographia at sentence level  MOBILITY STATUS: mod I with U-step  POSTURE COMMENTS:  rounded shoulders and forward head   FUNCTIONAL OUTCOME MEASURES: Fastening/unfastening 3 buttons: 1 min 43 secs   COORDINATION: 9 Hole Peg test: Right: 55.08 sec; Left: 49.17 sec Box and Blocks:  Right 26blocks, Left 34blocks  UE ROM:  A/ROM:shoulder flexion RUE-100 LUE105, elbow extension RUE-35 LUE -25, decreased bilateral supination, RUE 95% finger extension, maintains fingers flexed    SENSATION: WFL    COGNITION: Overall cognitive status: Impaired, slowed processing and  short term memory deficits  OBSERVATIONS: Bradykinesia and resting tremor RUE, dystonia RUE                                                                                                                    TREATMENT DATE:10/05/23 Standing at countertop Modified quadraped PWR! rock, min facilitation/ v.c  then PWR! rock standing Holding onto countertop marching then stepping forwards and backwards min v.c for amplitude. Dynamic step and reach to left and right sides to flip large playing cards PWR! hands basic 4, 10 reps each followed by fine motor coordination task placing screws into tool  board min v.c for amplitude using Bilateral UE's then removing with tools, min v.c for amplitude. Pt reports he plans to continue therapy til 8/20 then take a break.  10/03/23- Therapist checked several goals in prep for PN. see below. UBE x 6 mins level 1 for conditioning Standing at countertop Modified quadraped PWR! rock, min facilitation/ v.c  then PWR! rock stepping forwards and backwards at countertop min A PWR! hands PWR! up x 10 reps, followed by placing metal pegs into pegboard with right and left UE's min difficulty, increased time and min v.c for flicks. Pt took PD meds during session, todays' session was impacted by being and off time prior to meds.   09/26/23- UBE x 6 mins level 1  Supine PWR! up, with mod facilitation, PWR! twist rolling like a log due to history of back surgery. this was discontinuesd as pt had back and hip pain. PWR! step with bridge x 6 reps to simulate bed mobility, then rolling to side lying to sit up, min-mod facilitation/ v.c Modified quadraped PWR! rock, min facilitation/ v.c  dynamic step and reach at countertop min v.c for amplitude. stepping forwards and backwards at countertop min A Flipping and dealing playing cards for increased fine motor coordiantion, mod difficulty, v.c with RUE due to rigidity/ spasticity.  09/14/23 UBE x 5 mins level 1 for conditioning Standing at counter PWR! rock x 20 reps to targets, modified quadraped PWR! rock x 10 reps, min v.c and facilitation for amplitude seated PWR! up x 10 reps, v.c and demonstration Reviewed PWR! hands for PWR! up, rock, twist and step followed by fatening buttons with big movements mod v.c and demonstration. Therapist started checking short term goals. see goals below. 09/12/23 UBE x 6 mins level 1 Standing at counter PWR! rock x 20 reps to targets, modified quadraped PWR! rock x 10 reps, min v.c and facilitation for amplitude Dynamic step and reach flipping large playing cards, min v.c and  facilitation Stepping to targets with big movements, mod v.c fo amplitude   09/07/23 PWR! rock modified quadraped and standing x 10 reps min v.c UBE x 6 mins level 1 for conditioning Functional reaching in standing alternating between left and right hands to place large pegs in vertical pegboard, min v.c for amplitude PWR! hands for PWR! up, rock  and twist 10 reps each min v.c for amplitude Flipping and dealing playing  cards with left and right UE's, min difficulty with left, min-mod for RUE, v.c for big movements   08/31/23 PWr! hands basic 4 seated at table followed by flipping cards with big movments, mod v.c and demonstration. Standing at sink, modified quadraped PWR! rock, then standing PWR! rock x 10 reps each, Marching in place with big movements, min-mod v.c for amplitude, then stepping back with alternate feet holding a chair back, then out to the side x 10 reps, min v.c and supervision. Dynamic step and reach to retrieve replace items in cabinets with left and right UE's mod v.c fo amplitude Wiping cabinets for wall slides to promote shoulder flexion. UBE x 5 mins level 1 for conditioning.   08/29/23- UBE x 6 mins level 1 for conditioning, pt mainatined 40 rpm PWR! up in seated, x 10 reps attempted PWR! rock seated but it was uncomfortable for back so discontiinued. Dynamic step and reach to flip large playing cards at countertop, to left and right sides, then at an angle behind (like PWR! step in standing) mod v.c for amplitude and foot placement  PWR! hands followed by placing metal pegs 3 sizes into pegboard with RUE and removing with in hand manipulation, min-mod difficulty/ v.c for amplitude and flicks. Increased time required 08/24/23-PWR!modified quadraped rock at counter then standing PWR! rock to targets, min-mod v.c for amplitude, min facilitation Seated eedge of met, Doffing then donning sock and shoes with big movments, mod v.c and min-mod facillitaion. Pt tried sock aide to  donn left sock, hoever ift was easier to donn right sock manually without the sock aide. Increased time required  Pt attempted to fasten buttons on tabletop, however max difficulty despite v.c so task was discontinued. PWR! hands, followed by picking up coins to place in coin slot, min difficulty v.c  08/22/23- UBE x 5 mins level 1 for conditioning. PWR! up rock and step in seated, mod v.c and demonstraiton. PWR! rock in standing min v.c 10-20 reps each. Marching in standing at the sink, min v.c for amplitude. See pt education  08/16/23- eval only    PATIENT EDUCATION: Education details:  see above Person educated: Patient,  Education method: Explanation demonstration, v.c Education comprehension: verbalized understanding, returned demonstration, v.c  HOME EXERCISE PROGRAM: PWR! hands, beginning coordination-08/22/23  GOALS: Goals reviewed with patient? Yes  SHORT TERM GOALS: Target date: 09/14/23   I with PD specific HEP  Goal status: ongoing- issued needs reinforcement 10/03/23  2.  Pt will demonstrate understanding of adapted strategies/ AE to increase safety and I with ADLs/IADLs. Goal status:  ongoing, initiated  needs reinforcement 10/03/23  3.  Pt will demonstrate improved bilateral UE use as evidenced by increasing box and blocks score by 3 blocks bilaterally.   Goal status: RUE 40 blocks,  LUE 39 blocks, goal met 09/14/23  4.  Pt will demonstrate improved bilateral UE functional use and ease with dressing as evidenced by decreasing 3 button/ unbutton test time by 5 secs.  Goal status: ongoing >3 mins, 10/03/23  5.  Pt will demonstrate ability to retrieve a lightweight object at 105 shoulder flexion and -30 elbow extension with RUE.  Goal status:  met, 125, -10 10/03/23  6.  Pt will demonstrate ability to retrieve a lightweight object at 110 shoulder flexion and -20 elbow extension with LUE  Goal status:met, 125, -20 10/03/23  LONG TERM GOALS: Target date: 11/08/23   Pt will  demonstrate improved fine motor coordination for ADLs as evidenced by decreasing 9 hole  peg test score for RUE by 4 secs  Goal status: INITIAL  2.  Pt will demonstrate improved fine motor coordination for ADLs as evidenced by decreasing 9 hole peg test score for LUE by 4 secs  Goal status: INITIAL   3..  Pt will perfrom PPT#4 in 1 min15 secs or less without assist or LOB.  Baseline: 1 min 23 secs with min A- 09/14/23          Goal status; new 09/14/23   4.  Pt will verbalize understanding of ways to prevent future PD related complications  Goal status: INITIAL  5.  Pt will write a short paragraph with 100% legibility and minimal decrease in letter size  Goal status: INITIAL  6. Pt will perform PPT#2 in 15 secs or less Baseline: 17.02 Goal status: initial  ASSESSMENT:  CLINICAL IMPRESSION:   Patient is progressing towards goals. He reports that the weather today impacted his pain and how he is feeling. Pt continues to demonstrate improving fine motor coordination.. Performance deficits:ADLs, IADLs, coordination, dexterity, tone, ROM, strength, flexibility, Fine motor control, Gross motor control, mobility, balance, endurance, decreased knowledge of precautions, decreased knowledge of use of DME, vision, and UE functional use, cognitive skills including attention, memory, problem solving, safety awareness, sequencing, and thought, and psychosocial skills including coping strategies, environmental adaptation, habits, interpersonal interactions, and routines and behaviors.   IMPAIRMENTS: are limiting patient from ADLs, IADLs, rest and sleep, play, leisure, and social participation.   COMORBIDITIES:  may have co-morbidities  that affects occupational performance. Patient will benefit from skilled OT to address above impairments and improve overall function.  MODIFICATION OR ASSISTANCE TO COMPLETE EVALUATION: Min-Moderate modification of tasks or assist with assess necessary to complete  an evaluation.  OT OCCUPATIONAL PROFILE AND HISTORY: Detailed assessment: Review of records and additional review of physical, cognitive, psychosocial history related to current functional performance.  CLINICAL DECISION MAKING: Moderate - several treatment options, min-mod task modification necessary  REHAB POTENTIAL: Good  EVALUATION COMPLEXITY: Low    PLAN:  OT FREQUENCY: 2x/week  OT DURATION: 12 weeks  PLANNED INTERVENTIONS: 97168 OT Re-evaluation, 97535 self care/ADL training, 02889 therapeutic exercise, 97530 therapeutic activity, 97112 neuromuscular re-education, 97140 manual therapy, 97116 gait training, 02886 aquatic therapy, 97035 ultrasound, 97018 paraffin, 02989 moist heat, 97010 cryotherapy, 97034 contrast bath, 97129 Cognitive training (first 15 min), 02869 Cognitive training(each additional 15 min), passive range of motion, balance training, functional mobility training, visual/perceptual remediation/compensation, energy conservation, coping strategies training, patient/family education, and DME and/or AE instructions  RECOMMENDED OTHER SERVICES: n/a  CONSULTED AND AGREED WITH PLAN OF CARE: Patient  PLAN FOR NEXT SESSION:  work towards long term goals, pt is scheduled 1 more week then he plans to take a break.   Pernell Lenoir, OT 10/05/2023, 5:19 PM

## 2023-10-07 ENCOUNTER — Encounter: Payer: Self-pay | Admitting: Physical Medicine & Rehabilitation

## 2023-10-07 ENCOUNTER — Encounter: Attending: Physical Medicine & Rehabilitation | Admitting: Physical Medicine & Rehabilitation

## 2023-10-07 VITALS — BP 104/70 | HR 90 | Ht 73.0 in | Wt 220.2 lb

## 2023-10-07 DIAGNOSIS — G248 Other dystonia: Secondary | ICD-10-CM | POA: Diagnosis not present

## 2023-10-07 MED ORDER — ONABOTULINUMTOXINA 100 UNITS IJ SOLR
100.0000 [IU] | Freq: Once | INTRAMUSCULAR | Status: AC
  Administered 2023-10-07: 100 [IU] via INTRAMUSCULAR

## 2023-10-07 MED ORDER — SODIUM CHLORIDE (PF) 0.9 % IJ SOLN
2.0000 mL | Freq: Once | INTRAMUSCULAR | Status: AC
  Administered 2023-10-07: 2 mL

## 2023-10-07 NOTE — Patient Instructions (Signed)

## 2023-10-07 NOTE — Progress Notes (Signed)
 Botox  Injection for spasticity using needle EMG guidance  Dilution: 50 Units/ml Indication: Severe spasticity which interferes with ADL,mobility and/or  hygiene and is unresponsive to medication management and other conservative care Informed consent was obtained after describing risks and benefits of the procedure with the patient. This includes bleeding, bruising, infection, excessive weakness, or medication side effects. A REMS form is on file and signed. Needle: 27g 1 needle electrode Number of units per muscle Botox   Right   FDS 25U FDP 25U FPL 25U Waste 25U  All injections were done after obtaining appropriate EMG activity and after negative drawback for blood. The patient tolerated the procedure well. Post procedure instructions were given. A followup appointment was made.

## 2023-10-10 ENCOUNTER — Ambulatory Visit

## 2023-10-10 DIAGNOSIS — R278 Other lack of coordination: Secondary | ICD-10-CM | POA: Diagnosis not present

## 2023-10-10 DIAGNOSIS — R2689 Other abnormalities of gait and mobility: Secondary | ICD-10-CM

## 2023-10-10 DIAGNOSIS — R29818 Other symptoms and signs involving the nervous system: Secondary | ICD-10-CM

## 2023-10-10 DIAGNOSIS — R41840 Attention and concentration deficit: Secondary | ICD-10-CM

## 2023-10-10 DIAGNOSIS — R262 Difficulty in walking, not elsewhere classified: Secondary | ICD-10-CM

## 2023-10-10 DIAGNOSIS — G20B2 Parkinson's disease with dyskinesia, with fluctuations: Secondary | ICD-10-CM

## 2023-10-10 NOTE — Therapy (Signed)
 OUTPATIENT PHYSICAL THERAPY NEURO TREATMENT    Patient Name: Patrick Ross MRN: [id number] DOB:[birth year], 78 y.o., male Today's Date: 10/10/2023   PCP: Millicent Sharper MD REFERRING PROVIDER: Evonnie Asberry RAMAN DO  END OF SESSION:  PT End of Session - 10/10/23 1445     Visit Number 18    Date for PT Re-Evaluation 10/31/23    Authorization Type Medicare    Progress Note Due on Visit 20    PT Start Time 1445    PT Stop Time 1530    PT Time Calculation (min) 45 min    Activity Tolerance Patient tolerated treatment well    Behavior During Therapy WFL for tasks assessed/performed                  Past Medical History:  Diagnosis Date   Depression    DVT (deep venous thrombosis) (HCC)    GERD (gastroesophageal reflux disease)    History of kidney stones    passed   Hypertension    Hypothyroidism    Lumbar stenosis    OSA (obstructive sleep apnea)    wears cpap   Parkinson's disease (HCC)    PONV (postoperative nausea and vomiting)    REM behavioral disorder    Urinary incontinence    Past Surgical History:  Procedure Laterality Date   BACK SURGERY     x 2 - not fusion   BIOPSY  06/01/2022   Procedure: BIOPSY;  Surgeon: Dianna Specking, MD;  Location: [facility];  Service: Gastroenterology;;   COLONOSCOPY     DEEP BRAIN STIMULATOR PLACEMENT     left 12/27/2012, right 12/10/2011, left revision 02/02/2013, replacement bilateral 11/21/2015   ESOPHAGOGASTRODUODENOSCOPY (EGD) WITH PROPOFOL  N/A 06/01/2022   Procedure: ESOPHAGOGASTRODUODENOSCOPY (EGD) WITH PROPOFOL ;  Surgeon: Dianna Specking, MD;  Location: [facility];  Service: Gastroenterology;  Laterality: N/A;   MINOR PLACEMENT OF FIDUCIAL N/A 01/09/2019   Procedure: Fiducial placement;  Surgeon: Unice Pac, MD;  Location: [facility] OR;  Service: Neurosurgery;  Laterality: N/A;  Fiducial placement   PARTIAL NEPHRECTOMY Right 2005 ish   PULSE GENERATOR IMPLANT Right 01/23/2019   Procedure: Right chest implantable  pulse generator change;  Surgeon: Unice Pac, MD;  Location: [facility] OR;  Service: Neurosurgery;  Laterality: Right;   SPINAL FUSION     lumbar Fusion x 2   SUBTHALAMIC STIMULATOR BATTERY REPLACEMENT Left 02/02/2018   Procedure: Change implantable pulse generator battery, Left chest;  Surgeon: Unice Pac, MD;  Location: [facility] OR;  Service: Neurosurgery;  Laterality: Left;   SUBTHALAMIC STIMULATOR BATTERY REPLACEMENT N/A 01/22/2020   Procedure: REMOVAL OF LEFT BRAIN DEEP BRAIN STIMULATOR ELECTRODE, EXTENSION, AND PULSE GENERATOR;  Surgeon: Unice Pac, MD;  Location: [facility] OR;  Service: Neurosurgery;  Laterality: N/A;   SUBTHALAMIC STIMULATOR BATTERY REPLACEMENT Right 02/23/2022   Procedure: CHANGE IPG BATTERY, RT CHEST;  Surgeon: Dawley, Lani JAYSON, DO;  Location: [facility] OR;  Service: Neurosurgery;  Laterality: Right;   SUBTHALAMIC STIMULATOR INSERTION Left 01/16/2019   Procedure: Left deep brain stimulator electrode revision.;  Surgeon: Unice Pac, MD;  Location: [facility] OR;  Service: Neurosurgery;  Laterality: Left;   SUBTHALAMIC STIMULATOR INSERTION Left 01/23/2019   Procedure: Repositioning of Left Deep Brain Stimulator Electrode;  Surgeon: Unice Pac, MD;  Location: [facility] OR;  Service: Neurosurgery;  Laterality: Left;   TONSILLECTOMY     Patient Active Problem List   Diagnosis Date Noted   Melena 06/01/2022   Acute cystitis without hematuria 05/31/2022   Upper GI bleed 05/31/2022  Generalized weakness 05/30/2022   Abdominal pain 05/30/2022   OSA (obstructive sleep apnea) 05/30/2022   Hypothyroidism 05/30/2022   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) 12/14/2021   Mixed hyperlipidemia 12/14/2021   Essential hypertension 12/14/2021   Palpitations 12/14/2021   Angina pectoris (HCC) 12/14/2021   Urge incontinence 01/15/2021   History of UTI 01/15/2021   Abnormal urine findings 01/15/2021   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   History of nephrolithiasis 01/15/2021   DVT, lower  extremity, proximal, acute, right (HCC) 02/26/2020   Open scalp wound 01/22/2020   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HCC)    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   Parkinson's disease (HCC) 01/16/2018   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) 01/16/2018   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    ONSET DATE: exacerbation 06/21/23, Parkinson's diagnosis 2010  REFERRING DIAG: parkinsons disease with fluctuating dyskinesia  THERAPY DIAG:  Parkinson's disease with dyskinesia, with fluctuations (HCC)  Other lack of coordination  Other abnormalities of gait and mobility  Other symptoms and signs involving the nervous system  Difficulty in walking, not elsewhere classified  Attention and concentration deficit  Rationale for Evaluation and Treatment: Rehabilitation  SUBJECTIVE:                                                                                                                                                                                             SUBJECTIVE STATEMENT: Feel like I am moving slow today, I am not sure why. Also not feeling all that good either, I just feel weak and my legs are cramping up.    EVAL-Pt and his wife report long history of parkinson's,  biggest difficulty with his mobility that they note is R lower back  pain and labored supine to sit movement, also poor foot clearance.  He doesn't like relying on the walker Pt accompanied by: significant other  PERTINENT HISTORY: parkinson's diagnosed greater than 10 yrs ago, deep brain stimulator R, had in L as well but had to be removed for various reasons.  walks around neighborhood 3 x week with aide Tuesdays and Thursdays parkisons bicycle class 2 x Drue training for upper body work out Referred to PT and ST by neurologist   PAIN:  Are you having pain? R lower back pain constant  PRECAUTIONS: Fall and Other: deep brain stimulator on R   RED FLAGS: None   WEIGHT BEARING RESTRICTIONS: No  FALLS: Has patient  fallen in last 6 months? No  LIVING ENVIRONMENT: Lives with: lives with their spouse Lives in: House/apartment Stairs: has internal steps, but has had home renovated and adapted so the he and his wife live  in downstairs apartment, all one level with bathroom and kitchen Has following equipment at home: Single point cane, Walker - 4 wheeled, Shower bench, Grab bars, and Ramped entry  PLOF: Independent  PATIENT GOALS: improve bed mobility and improve gait, stability, avoid falls  OBJECTIVE:  Note: Objective measures were completed at Evaluation unless otherwise noted.  DIAGNOSTIC FINDINGS: see neuro notes, has deep brain stimulator R   COGNITION: Overall cognitive status: Within functional limits for tasks assessed   SENSATION: WFL  COORDINATION: Slowed movements B and noted curling R hand and R foot at times, with gait festinating on turns   EDEMA:  None noted  MUSCLE TONE: noted curling R fingers, toes, knee flex and hip flex at times, randomly during evaluation  MUSCLE LENGTH: Hamstrings: Right -30 deg; Left -30 deg Thomas test: Right nt deg; Left nt deg    POSTURE: Standing with some forward lean, weight on forefeet, L calf atrophied pt and wife report chronic after L lumbar surgery.     LOWER EXTREMITY ROM:   grossly wfl, hips not assessed as not positioned on mat today   LOWER EXTREMITY MMT:    MMT Right Eval Left Eval  Hip flexion 4 4-  Hip extension    Hip abduction    Hip adduction    Hip internal rotation    Hip external rotation    Knee flexion 4 4  Knee extension 4 4-  Ankle dorsiflexion 4 3+  Ankle plantarflexion 4- 3-  Ankle inversion    Ankle eversion    (Blank rows = not tested)  BED MOBILITY:  Not tested  TRANSFERS: I but slowed, uses hands on seat or with rollator  GAIT: Findings: Gait Characteristics: step to pattern, decreased step length- Right, decreased step length- Left, decreased stride length, decreased hip/knee flexion-  Right, decreased hip/knee flexion- Left, shuffling, festinating, trunk flexed, and narrow BOS, Distance walked: 100', Assistive device utilized:Walker - 4 wheeled, Level of assistance: SBA, and Comments: festinating noted on turns, tends to turn L  FUNCTIONAL TESTS:  30 seconds chair stand test 6 reps Timed up and go (TUG): 33.39 with rollator Standing static feet together 15 sec Standing semi tandem 10 sec each leg, needed CGA with L foot forward Standing marching, required B hand hold support to maintain balance                                                                                                                              TREATMENT DATE: 10/10/23 Basic PWRMoves - lateral weight shift reaching up - trunk rotation  NuStep L5 x102mins  Shoulder ext 10# 2x10 Punches with 4# dumbbells  Blue ball OHP 2x10 Modified sit ups 2x10  10/05/23 In bars reaching across body to grab cones at high and low surfaces NuStep L5 x36mins  Blue ball slams 2x10 Bouncing ball  STS 2x8   10/03/23:  Nustep L 5 x 6 min In ll bars for lunges on  rounded side of BOSU, mass practice In ll bars with one foot on BOSU and reaches down to target, cone, with one hand pt reached within 2 of cone, needed mod assist to return to upright standing  Prone lying, therapist provided post hip stabilization with overpressure for knee flexion stretch, very stiff R LE ,  needed mod assist to return to upright sitting.   [employer] with blue ball mass practice Standing for ball toss mass practice Seated forward, side/ side , rotational stretch with largest ball    09/29/23 NuStep L5x79mins  On airex marching with RW STS with red ball to floor and overhead 2x8 Standing ball toss and bounces  Seated pball rolls forward and rotations x10   09/26/23 Recheck goals -30s chair test 11 reps -TUG 20s  NuStep L5 x63mins  Standing rows and ext in front of mat table 2x10 STS w/ball toss 2x10 2# boxing punches  Standing  marches 3# Standing hip abd 3#   09/22/23 NuStep L5x11mins  OHP alternating 2# 2x10 Arm swings 2# 2x10 STS yellow ball chest press 2x10 Leg ext 10# 3x10 HS curls 25# 3x10 Ball slams with blue ball 2x10   09/19/23:  Prone for ant hip/trunk stretch , therapist assisting with alt knee flexion, R very stiff Prone with roll under ankles, for TKE's 5 sec holds, 10 x 2 Standing in front of mat alt arm swings high amplitude 2# wts x 15 reps each Standing for medicine ball bounces to floor with 2 KG yellow mediball mass practice Sit to stand from hi low table with yellow mediball 10x Nustep L 5 Ue's and LE's x 6 min  08/25/23 NuStep L5x10mins  Leg ext 10# 2x10 HS curls 25# 2x10 Fitter pushes 1 blue and 1 black band 2x10 STS 2x10 Arm swings holding 1 and 2# WaTe 20s x2    08/23/23 Recheck goals - 5xSTS - TUG - MMT NuStep L5x31mins  Modified sit ups holding yellow 2x10 Box tap 6 Step ups 6 Balance on airex feet together, eyes open 220s and then closed 10   08/18/23 Leg ext 10# 2x10 HS curls 25# 2x10 OHP press yellow ball 2x10 Supine LE stretching- HS, LTR, SKTC  NuStep L5 x30mins    08/16/23 Holding big red ball tapping floor to overhead to side to side x5 Standing shoulder rows and ext green 2x10 Standing shoulder flexion with 2# WaTE 2x10 Box taps 4 holding on and then without walker CGA-minA  NuStep L5x46mins STS 2x10   08/11/23 NuStep L5x93mins  Fitter pushes 2 blue bands 2x10  Side steps over obstacles in bars  On airex reaching for numbers  Isometric ab push downs 2x10   08/09/23 NuStep L5x95mins  STS with chest press red ball 2x8 Standing march 3#  3# hip abd 2x10  Horizontal abd green 2x10 Leg ext 10# 2x10 HS curls 25# 2x10    08/04/23 30s chair test TUG 27s  MMT 4/5 Standing shoulder ext and rows green band 2x10 Standing shoulder flexion 2# WaTE x10 - stopped due to feeling dizzy and off balance  NuStep L5 x54mins  Seated rotation with yellow ball  2x10   08/02/23 NuStep L5x89mins  Side steps over obstacles in bars Forwards steps over obstacles in bars Calf raises x10 Box taps 4 1HRA Step ups 4 2HRA  STS with OHP 2x5  07/28/23 Supine HS stretch and SKTC  Lower trunk rotations x10 SLR 2x10 Feet on pball rotations and knees to chest AB isometric holds x10  NuStep L5x19mins  STS 3x5   07/27/23 NuStep L5x52mins  LAQ 2# 2x10 HS curls green 2x10  Hip abduction green 2x10 STS 2x10  Stretching with pball rotations and flexion x5     07/06/23 Evaluation, advised to attempt prone lying on bed 10 to 15 min  to stretch body into extension( pt doesn't ever lie prone)   PATIENT EDUCATION: Education details: POC, goals Person educated: Patient and Spouse Education method: Explanation, Demonstration, Tactile cues, and Verbal cues Education comprehension: verbalized understanding, verbal cues required, and needs further education  HOME EXERCISE PROGRAM: Access Code: [email or web address] URL: [email or web address] Date: 08/02/2023 Prepared by: Almetta Fam  Exercises - Supine Hamstring Stretch with Strap  - 2 x daily - 7 x weekly - 2 reps - 15 hold - Modified Thomas Stretch  - 2 x daily - 7 x weekly - 2 reps - 15 hold - Supine Bridge  - 1 x daily - 7 x weekly - 2 sets - 10 reps - Standing Shoulder Horizontal Abduction with Resistance  - 1 x daily - 7 x weekly - 2 sets - 10 reps  GOALS: Goals reviewed with patient? Yes  SHORT TERM GOALS: Target date: 4 weeks, 08/03/23  I home program for posture and for hamstring, hip flexor stretchig Baseline: Goal status: ongoing 08/02/23, ongoing 08/23/23  LONG TERM GOALS: Target date: 10/31/23  30 sec sit to stand improve from 6 reps to 12 reps for improved coordination, strength, endurance LEs Baseline:  Goal status: 7 reps ongoing 08/04/23, 8 reps 08/23/23, 11 reps 09/26/23  2.  I and pain free supine to sit motion , rolling to R Baseline: very painful per pt and wife history, not  specifically assessed in clinic today Goal status: ongoing 07/28/23, ongoing 08/09/23, still has pain with bed mobility 08/23/23, can do but still painful 09/26/23  3.  TUG score improve from 33 sec to 18 or less for reduced fall risk   Baseline:  Goal status: 27s ongoing 08/04/23, 22s 08/23/23, 20s 09/26/23  4.  Improve strength LE's for B quads, hamstrings to 4+5 for improved functional strength Baseline: 4- Goal status: 4/5 08/04/23, 5/5 MET 08/23/23   ASSESSMENT:  CLINICAL IMPRESSION: Patient returns not feeling all that well. He is moving slower today and took longer with interventions today. Continued to work on big movements, adding in some PWRMoves today.    EVAL Patient is a 77 y.o. male who was evaluated today by physical therapy due to referral from his neurologist, with decline in function, particularly control of R extremities, with his Parkinson's diagnosis.  He and his wife have been proactive about maintaining his function, he attends a training/ strengthening session locally 2 x week , attends parkinson's cycling weekly, and walks around 0.6 miles with his aide 3 x week outdoors with his rollator.  They recently have adapted their home with a main floor renovation so that their bedroom, bath, kitchen are all on one level.   Main problems identified today are weakness B LE's, L ankle is weak chronically due to lumbar radiculopathy and B knee, hips musculature is weak.  He also has coordination deficits with festinating gait particularly on turns and static / dynamic balance deficits.  Has chronic R sided lower back pain which is particularly intense with moving supine to sit.  He should benefit from physical therapy intervention to address his deficits and update his current routine for stretching and strengthening at home.    OBJECTIVE IMPAIRMENTS: Abnormal gait, decreased activity tolerance,  decreased balance, decreased coordination, decreased endurance, decreased mobility, difficulty  walking, decreased strength, hypomobility, impaired perceived functional ability, impaired UE functional use, postural dysfunction, and pain.   ACTIVITY LIMITATIONS: carrying, lifting, squatting, stairs, transfers, and locomotion level  PARTICIPATION LIMITATIONS: meal prep, cleaning, laundry, shopping, community activity, and church  PERSONAL FACTORS: Age, Behavior pattern, Fitness, Past/current experiences, Time since onset of injury/illness/exacerbation, and 1-2 comorbidities: h/o L lumbar radiculopathy and weakness L LE, chronic R sided lower back pain, are also affecting patient's functional outcome.   REHAB POTENTIAL: Good  CLINICAL DECISION MAKING: Evolving/moderate complexity  EVALUATION COMPLEXITY: Moderate  PLAN:  PT FREQUENCY: 2x/week  PT DURATION: 12 weeks  PLANNED INTERVENTIONS: 97110-Therapeutic exercises, 97530- Therapeutic activity, [id number]- Neuromuscular re-education, 97535- Self Care, and [id number]- Manual therapy  PLAN FOR NEXT SESSION: continue with balance, stretching, coordination activites   Almetta Fam, PT, DPT 10/10/2023, 3:25 PM

## 2023-10-11 NOTE — Therapy (Signed)
 OUTPATIENT PHYSICAL THERAPY NEURO TREATMENT    Patient Name: Patrick Ross MRN: [id number] DOB:January 27, 1946, 78 y.o., male Today's Date: 10/12/2023   PCP: Millicent Sharper MD REFERRING PROVIDER: Evonnie Asberry RAMAN DO  END OF SESSION:  PT End of Session - 10/12/23 1445     Visit Number 19    Date for PT Re-Evaluation 10/31/23    Authorization Type Medicare    Progress Note Due on Visit 20    PT Start Time 1445    PT Stop Time 1530    PT Time Calculation (min) 45 min    Activity Tolerance Patient tolerated treatment well    Behavior During Therapy WFL for tasks assessed/performed                   Past Medical History:  Diagnosis Date   Depression    DVT (deep venous thrombosis) (HCC)    GERD (gastroesophageal reflux disease)    History of kidney stones    passed   Hypertension    Hypothyroidism    Lumbar stenosis    OSA (obstructive sleep apnea)    wears cpap   Parkinson's disease (HCC)    PONV (postoperative nausea and vomiting)    REM behavioral disorder    Urinary incontinence    Past Surgical History:  Procedure Laterality Date   BACK SURGERY     x 2 - not fusion   BIOPSY  06/01/2022   Procedure: BIOPSY;  Surgeon: Dianna Specking, MD;  Location: [facility];  Service: Gastroenterology;;   COLONOSCOPY     DEEP BRAIN STIMULATOR PLACEMENT     left 12/27/2012, right 12/10/2011, left revision 02/02/2013, replacement bilateral 11/21/2015   ESOPHAGOGASTRODUODENOSCOPY (EGD) WITH PROPOFOL  N/A 06/01/2022   Procedure: ESOPHAGOGASTRODUODENOSCOPY (EGD) WITH PROPOFOL ;  Surgeon: Dianna Specking, MD;  Location: [facility];  Service: Gastroenterology;  Laterality: N/A;   MINOR PLACEMENT OF FIDUCIAL N/A 01/09/2019   Procedure: Fiducial placement;  Surgeon: Unice Pac, MD;  Location: [facility] OR;  Service: Neurosurgery;  Laterality: N/A;  Fiducial placement   PARTIAL NEPHRECTOMY Right 2005 ish   PULSE GENERATOR IMPLANT Right 01/23/2019   Procedure: Right chest  implantable pulse generator change;  Surgeon: Unice Pac, MD;  Location: [facility] OR;  Service: Neurosurgery;  Laterality: Right;   SPINAL FUSION     lumbar Fusion x 2   SUBTHALAMIC STIMULATOR BATTERY REPLACEMENT Left 02/02/2018   Procedure: Change implantable pulse generator battery, Left chest;  Surgeon: Unice Pac, MD;  Location: [facility] OR;  Service: Neurosurgery;  Laterality: Left;   SUBTHALAMIC STIMULATOR BATTERY REPLACEMENT N/A 01/22/2020   Procedure: REMOVAL OF LEFT BRAIN DEEP BRAIN STIMULATOR ELECTRODE, EXTENSION, AND PULSE GENERATOR;  Surgeon: Unice Pac, MD;  Location: [facility] OR;  Service: Neurosurgery;  Laterality: N/A;   SUBTHALAMIC STIMULATOR BATTERY REPLACEMENT Right 02/23/2022   Procedure: CHANGE IPG BATTERY, RT CHEST;  Surgeon: Dawley, Lani JAYSON, DO;  Location: [facility] OR;  Service: Neurosurgery;  Laterality: Right;   SUBTHALAMIC STIMULATOR INSERTION Left 01/16/2019   Procedure: Left deep brain stimulator electrode revision.;  Surgeon: Unice Pac, MD;  Location: [facility] OR;  Service: Neurosurgery;  Laterality: Left;   SUBTHALAMIC STIMULATOR INSERTION Left 01/23/2019   Procedure: Repositioning of Left Deep Brain Stimulator Electrode;  Surgeon: Unice Pac, MD;  Location: [facility] OR;  Service: Neurosurgery;  Laterality: Left;   TONSILLECTOMY     Patient Active Problem List   Diagnosis Date Noted   Melena 06/01/2022   Acute cystitis without hematuria 05/31/2022   Upper GI bleed 05/31/2022  Generalized weakness 05/30/2022   Abdominal pain 05/30/2022   OSA (obstructive sleep apnea) 05/30/2022   Hypothyroidism 05/30/2022   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) 12/14/2021   Mixed hyperlipidemia 12/14/2021   Essential hypertension 12/14/2021   Palpitations 12/14/2021   Angina pectoris (HCC) 12/14/2021   Urge incontinence 01/15/2021   History of UTI 01/15/2021   Abnormal urine findings 01/15/2021   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   History of nephrolithiasis 01/15/2021    DVT, lower extremity, proximal, acute, right (HCC) 02/26/2020   Open scalp wound 01/22/2020   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HCC)    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   Parkinson's disease (HCC) 01/16/2018   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) 01/16/2018   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    ONSET DATE: exacerbation 06/21/23, Parkinson's diagnosis 2010  REFERRING DIAG: parkinsons disease with fluctuating dyskinesia  THERAPY DIAG:  Parkinson's disease with dyskinesia, with fluctuations (HCC)  Other lack of coordination  Other abnormalities of gait and mobility  Other symptoms and signs involving the nervous system  Difficulty in walking, not elsewhere classified  Rationale for Evaluation and Treatment: Rehabilitation  SUBJECTIVE:                                                                                                                                                                                             SUBJECTIVE STATEMENT: Not doing too good, might be the weather. I got Botox  in the arm and it feels weak.    EVAL-Pt and his wife report long history of parkinson's,  biggest difficulty with his mobility that they note is R lower back  pain and labored supine to sit movement, also poor foot clearance.  He doesn't like relying on the walker Pt accompanied by: significant other  PERTINENT HISTORY: parkinson's diagnosed greater than 10 yrs ago, deep brain stimulator R, had in L as well but had to be removed for various reasons.  walks around neighborhood 3 x week with aide Tuesdays and Thursdays parkisons bicycle class 2 x Drue training for upper body work out Referred to PT and ST by neurologist   PAIN:  Are you having pain? R lower back pain constant  PRECAUTIONS: Fall and Other: deep brain stimulator on R   RED FLAGS: None   WEIGHT BEARING RESTRICTIONS: No  FALLS: Has patient fallen in last 6 months? No  LIVING ENVIRONMENT: Lives with: lives with their  spouse Lives in: House/apartment Stairs: has internal steps, but has had home renovated and adapted so the he and his wife live in downstairs apartment, all one level with bathroom and kitchen Has following equipment at home: Single  point cane, Walker - 4 wheeled, Shower bench, Grab bars, and Ramped entry  PLOF: Independent  PATIENT GOALS: improve bed mobility and improve gait, stability, avoid falls  OBJECTIVE:  Note: Objective measures were completed at Evaluation unless otherwise noted.  DIAGNOSTIC FINDINGS: see neuro notes, has deep brain stimulator R   COGNITION: Overall cognitive status: Within functional limits for tasks assessed   SENSATION: WFL  COORDINATION: Slowed movements B and noted curling R hand and R foot at times, with gait festinating on turns   EDEMA:  None noted  MUSCLE TONE: noted curling R fingers, toes, knee flex and hip flex at times, randomly during evaluation  MUSCLE LENGTH: Hamstrings: Right -30 deg; Left -30 deg Thomas test: Right nt deg; Left nt deg    POSTURE: Standing with some forward lean, weight on forefeet, L calf atrophied pt and wife report chronic after L lumbar surgery.     LOWER EXTREMITY ROM:   grossly wfl, hips not assessed as not positioned on mat today   LOWER EXTREMITY MMT:    MMT Right Eval Left Eval  Hip flexion 4 4-  Hip extension    Hip abduction    Hip adduction    Hip internal rotation    Hip external rotation    Knee flexion 4 4  Knee extension 4 4-  Ankle dorsiflexion 4 3+  Ankle plantarflexion 4- 3-  Ankle inversion    Ankle eversion    (Blank rows = not tested)  BED MOBILITY:  Not tested  TRANSFERS: I but slowed, uses hands on seat or with rollator  GAIT: Findings: Gait Characteristics: step to pattern, decreased step length- Right, decreased step length- Left, decreased stride length, decreased hip/knee flexion- Right, decreased hip/knee flexion- Left, shuffling, festinating, trunk flexed, and  narrow BOS, Distance walked: 100', Assistive device utilized:Walker - 4 wheeled, Level of assistance: SBA, and Comments: festinating noted on turns, tends to turn L  FUNCTIONAL TESTS:  30 seconds chair stand test 6 reps Timed up and go (TUG): 33.39 with rollator Standing static feet together 15 sec Standing semi tandem 10 sec each leg, needed CGA with L foot forward Standing marching, required B hand hold support to maintain balance                                                                                                                              TREATMENT DATE: 10/12/23 Supine feet on pball rotations, knees to chest HS stretch 30s with strap  AB roll up x10 AB isometric x10 3s holds  NuStep L6 x19mins  STS with ball toss 2x8 Trunk rotation with yellow ball   10/10/23 Basic PWRMoves - lateral weight shift reaching up - trunk rotation  NuStep L5 x68mins  Shoulder ext 10# 2x10 Punches with 4# dumbbells  Blue ball OHP 2x10 Modified sit ups 2x10  10/05/23 In bars reaching across body to grab cones at high and low surfaces NuStep L5 x71mins  Blue ball slams 2x10 Bouncing ball  STS 2x8   10/03/23:  Nustep L 5 x 6 min In ll bars for lunges on rounded side of BOSU, mass practice In ll bars with one foot on BOSU and reaches down to target, cone, with one hand pt reached within 2 of cone, needed mod assist to return to upright standing  Prone lying, therapist provided post hip stabilization with overpressure for knee flexion stretch, very stiff R LE ,  needed mod assist to return to upright sitting.   [employer] with blue ball mass practice Standing for ball toss mass practice Seated forward, side/ side , rotational stretch with largest ball    09/29/23 NuStep L5x74mins  On airex marching with RW STS with red ball to floor and overhead 2x8 Standing ball toss and bounces  Seated pball rolls forward and rotations x10   09/26/23 Recheck goals -30s chair test 11 reps -TUG  20s  NuStep L5 x49mins  Standing rows and ext in front of mat table 2x10 STS w/ball toss 2x10 2# boxing punches  Standing marches 3# Standing hip abd 3#   09/22/23 NuStep L5x85mins  OHP alternating 2# 2x10 Arm swings 2# 2x10 STS yellow ball chest press 2x10 Leg ext 10# 3x10 HS curls 25# 3x10 Ball slams with blue ball 2x10   09/19/23:  Prone for ant hip/trunk stretch , therapist assisting with alt knee flexion, R very stiff Prone with roll under ankles, for TKE's 5 sec holds, 10 x 2 Standing in front of mat alt arm swings high amplitude 2# wts x 15 reps each Standing for medicine ball bounces to floor with 2 KG yellow mediball mass practice Sit to stand from hi low table with yellow mediball 10x Nustep L 5 Ue's and LE's x 6 min  08/25/23 NuStep L5x45mins  Leg ext 10# 2x10 HS curls 25# 2x10 Fitter pushes 1 blue and 1 black band 2x10 STS 2x10 Arm swings holding 1 and 2# WaTe 20s x2    08/23/23 Recheck goals - 5xSTS - TUG - MMT NuStep L5x46mins  Modified sit ups holding yellow 2x10 Box tap 6 Step ups 6 Balance on airex feet together, eyes open 220s and then closed 10   08/18/23 Leg ext 10# 2x10 HS curls 25# 2x10 OHP press yellow ball 2x10 Supine LE stretching- HS, LTR, SKTC  NuStep L5 x9mins    08/16/23 Holding big red ball tapping floor to overhead to side to side x5 Standing shoulder rows and ext green 2x10 Standing shoulder flexion with 2# WaTE 2x10 Box taps 4 holding on and then without walker CGA-minA  NuStep L5x47mins STS 2x10   08/11/23 NuStep L5x10mins  Fitter pushes 2 blue bands 2x10  Side steps over obstacles in bars  On airex reaching for numbers  Isometric ab push downs 2x10   08/09/23 NuStep L5x80mins  STS with chest press red ball 2x8 Standing march 3#  3# hip abd 2x10  Horizontal abd green 2x10 Leg ext 10# 2x10 HS curls 25# 2x10    08/04/23 30s chair test TUG 27s  MMT 4/5 Standing shoulder ext and rows green band 2x10 Standing  shoulder flexion 2# WaTE x10 - stopped due to feeling dizzy and off balance  NuStep L5 x87mins  Seated rotation with yellow ball 2x10   08/02/23 NuStep L5x27mins  Side steps over obstacles in bars Forwards steps over obstacles in bars Calf raises x10 Box taps 4 1HRA Step ups 4 2HRA  STS with OHP 2x5  07/28/23  Supine HS stretch and SKTC  Lower trunk rotations x10 SLR 2x10 Feet on pball rotations and knees to chest AB isometric holds x10  NuStep L5x30mins  STS 3x5   07/27/23 NuStep L5x29mins  LAQ 2# 2x10 HS curls green 2x10  Hip abduction green 2x10 STS 2x10  Stretching with pball rotations and flexion x5     07/06/23 Evaluation, advised to attempt prone lying on bed 10 to 15 min  to stretch body into extension( pt doesn't ever lie prone)   PATIENT EDUCATION: Education details: POC, goals Person educated: Patient and Spouse Education method: Explanation, Demonstration, Tactile cues, and Verbal cues Education comprehension: verbalized understanding, verbal cues required, and needs further education  HOME EXERCISE PROGRAM: Access Code: [email or web address] URL: [email or web address] Date: 08/02/2023 Prepared by: Almetta Fam  Exercises - Supine Hamstring Stretch with Strap  - 2 x daily - 7 x weekly - 2 reps - 15 hold - Modified Thomas Stretch  - 2 x daily - 7 x weekly - 2 reps - 15 hold - Supine Bridge  - 1 x daily - 7 x weekly - 2 sets - 10 reps - Standing Shoulder Horizontal Abduction with Resistance  - 1 x daily - 7 x weekly - 2 sets - 10 reps  GOALS: Goals reviewed with patient? Yes  SHORT TERM GOALS: Target date: 4 weeks, 08/03/23  I home program for posture and for hamstring, hip flexor stretchig Baseline: Goal status: ongoing 08/02/23, ongoing 08/23/23, MET 10/12/23  LONG TERM GOALS: Target date: 10/31/23  30 sec sit to stand improve from 6 reps to 12 reps for improved coordination, strength, endurance LEs Baseline:  Goal status: 7 reps ongoing 08/04/23, 8 reps  08/23/23, 11 reps 09/26/23  2.  I and pain free supine to sit motion , rolling to R Baseline: very painful per pt and wife history, not specifically assessed in clinic today Goal status: ongoing 07/28/23, ongoing 08/09/23, still has pain with bed mobility 08/23/23, can do but still painful 09/26/23, does not hurt as bad 10/12/23  3.  TUG score improve from 33 sec to 18 or less for reduced fall risk   Baseline:  Goal status: 27s ongoing 08/04/23, 22s 08/23/23, 20s 09/26/23  4.  Improve strength LE's for B quads, hamstrings to 4+5 for improved functional strength Baseline: 4- Goal status: 4/5 08/04/23, 5/5 MET 08/23/23   ASSESSMENT:  CLINICAL IMPRESSION: Patient returns not feeling all that well, reports some weakness in his arm following botox . Continued to work on big movements, adding in some supine exercises, stretching, and core strengthening. STS required some use of table to prevent LOB. His R side is tight as noted with LTR and seated trunk rotations with ball.    EVAL Patient is a 78 y.o. male who was evaluated today by physical therapy due to referral from his neurologist, with decline in function, particularly control of R extremities, with his Parkinson's diagnosis.  He and his wife have been proactive about maintaining his function, he attends a training/ strengthening session locally 2 x week , attends parkinson's cycling weekly, and walks around 0.6 miles with his aide 3 x week outdoors with his rollator.  They recently have adapted their home with a main floor renovation so that their bedroom, bath, kitchen are all on one level.   Main problems identified today are weakness B LE's, L ankle is weak chronically due to lumbar radiculopathy and B knee, hips musculature is weak.  He also has coordination deficits with festinating gait particularly  on turns and static / dynamic balance deficits.  Has chronic R sided lower back pain which is particularly intense with moving supine to sit.  He  should benefit from physical therapy intervention to address his deficits and update his current routine for stretching and strengthening at home.    OBJECTIVE IMPAIRMENTS: Abnormal gait, decreased activity tolerance, decreased balance, decreased coordination, decreased endurance, decreased mobility, difficulty walking, decreased strength, hypomobility, impaired perceived functional ability, impaired UE functional use, postural dysfunction, and pain.   ACTIVITY LIMITATIONS: carrying, lifting, squatting, stairs, transfers, and locomotion level  PARTICIPATION LIMITATIONS: meal prep, cleaning, laundry, shopping, community activity, and church  PERSONAL FACTORS: Age, Behavior pattern, Fitness, Past/current experiences, Time since onset of injury/illness/exacerbation, and 1-2 comorbidities: h/o L lumbar radiculopathy and weakness L LE, chronic R sided lower back pain, are also affecting patient's functional outcome.   REHAB POTENTIAL: Good  CLINICAL DECISION MAKING: Evolving/moderate complexity  EVALUATION COMPLEXITY: Moderate  PLAN:  PT FREQUENCY: 2x/week  PT DURATION: 12 weeks  PLANNED INTERVENTIONS: 97110-Therapeutic exercises, 97530- Therapeutic activity, [id number]- Neuromuscular re-education, 97535- Self Care, and [id number]- Manual therapy  PLAN FOR NEXT SESSION: continue with balance, stretching, coordination activities, 20th visit PN    Almetta Fam, PT, DPT 10/12/2023, 3:30 PM

## 2023-10-12 ENCOUNTER — Ambulatory Visit

## 2023-10-12 DIAGNOSIS — R262 Difficulty in walking, not elsewhere classified: Secondary | ICD-10-CM

## 2023-10-12 DIAGNOSIS — R278 Other lack of coordination: Secondary | ICD-10-CM

## 2023-10-12 DIAGNOSIS — R29818 Other symptoms and signs involving the nervous system: Secondary | ICD-10-CM

## 2023-10-12 DIAGNOSIS — G20B2 Parkinson's disease with dyskinesia, with fluctuations: Secondary | ICD-10-CM

## 2023-10-12 DIAGNOSIS — R2689 Other abnormalities of gait and mobility: Secondary | ICD-10-CM

## 2023-10-14 NOTE — Therapy (Signed)
 OUTPATIENT PHYSICAL THERAPY NEURO TREATMENT   Progress Note Reporting Period 08/23/23 to 10/17/23  See note below for Objective Data and Assessment of Progress/Goals.     Patient Name: Patrick Ross MRN: [id number] DOB:1945-05-20, 78 y.o., male Today's Date: 10/17/2023   PCP: Millicent Sharper MD REFERRING PROVIDER: Evonnie Asberry RAMAN DO  END OF SESSION:  PT End of Session - 10/17/23 1443     Visit Number 20    Date for PT Re-Evaluation 10/31/23    Authorization Type Medicare    Progress Note Due on Visit 20    PT Start Time 1445    PT Stop Time 1530    PT Time Calculation (min) 45 min    Activity Tolerance Patient tolerated treatment well    Behavior During Therapy WFL for tasks assessed/performed                    Past Medical History:  Diagnosis Date   Depression    DVT (deep venous thrombosis) (HCC)    GERD (gastroesophageal reflux disease)    History of kidney stones    passed   Hypertension    Hypothyroidism    Lumbar stenosis    OSA (obstructive sleep apnea)    wears cpap   Parkinson's disease (HCC)    PONV (postoperative nausea and vomiting)    REM behavioral disorder    Urinary incontinence    Past Surgical History:  Procedure Laterality Date   BACK SURGERY     x 2 - not fusion   BIOPSY  06/01/2022   Procedure: BIOPSY;  Surgeon: Dianna Specking, MD;  Location: [facility];  Service: Gastroenterology;;   COLONOSCOPY     DEEP BRAIN STIMULATOR PLACEMENT     left 12/27/2012, right 12/10/2011, left revision 02/02/2013, replacement bilateral 11/21/2015   ESOPHAGOGASTRODUODENOSCOPY (EGD) WITH PROPOFOL  N/A 06/01/2022   Procedure: ESOPHAGOGASTRODUODENOSCOPY (EGD) WITH PROPOFOL ;  Surgeon: Dianna Specking, MD;  Location: [facility];  Service: Gastroenterology;  Laterality: N/A;   MINOR PLACEMENT OF FIDUCIAL N/A 01/09/2019   Procedure: Fiducial placement;  Surgeon: Unice Pac, MD;  Location: [facility] OR;  Service: Neurosurgery;  Laterality: N/A;  Fiducial  placement   PARTIAL NEPHRECTOMY Right 2005 ish   PULSE GENERATOR IMPLANT Right 01/23/2019   Procedure: Right chest implantable pulse generator change;  Surgeon: Unice Pac, MD;  Location: [facility] OR;  Service: Neurosurgery;  Laterality: Right;   SPINAL FUSION     lumbar Fusion x 2   SUBTHALAMIC STIMULATOR BATTERY REPLACEMENT Left 02/02/2018   Procedure: Change implantable pulse generator battery, Left chest;  Surgeon: Unice Pac, MD;  Location: [facility] OR;  Service: Neurosurgery;  Laterality: Left;   SUBTHALAMIC STIMULATOR BATTERY REPLACEMENT N/A 01/22/2020   Procedure: REMOVAL OF LEFT BRAIN DEEP BRAIN STIMULATOR ELECTRODE, EXTENSION, AND PULSE GENERATOR;  Surgeon: Unice Pac, MD;  Location: [facility] OR;  Service: Neurosurgery;  Laterality: N/A;   SUBTHALAMIC STIMULATOR BATTERY REPLACEMENT Right 02/23/2022   Procedure: CHANGE IPG BATTERY, RT CHEST;  Surgeon: Dawley, Lani JAYSON, DO;  Location: [facility] OR;  Service: Neurosurgery;  Laterality: Right;   SUBTHALAMIC STIMULATOR INSERTION Left 01/16/2019   Procedure: Left deep brain stimulator electrode revision.;  Surgeon: Unice Pac, MD;  Location: [facility] OR;  Service: Neurosurgery;  Laterality: Left;   SUBTHALAMIC STIMULATOR INSERTION Left 01/23/2019   Procedure: Repositioning of Left Deep Brain Stimulator Electrode;  Surgeon: Unice Pac, MD;  Location: [facility] OR;  Service: Neurosurgery;  Laterality: Left;   TONSILLECTOMY     Patient Active Problem List  Diagnosis Date Noted   Melena 06/01/2022   Acute cystitis without hematuria 05/31/2022   Upper GI bleed 05/31/2022   Generalized weakness 05/30/2022   Abdominal pain 05/30/2022   OSA (obstructive sleep apnea) 05/30/2022   Hypothyroidism 05/30/2022   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) 12/14/2021   Mixed hyperlipidemia 12/14/2021   Essential hypertension 12/14/2021   Palpitations 12/14/2021   Angina pectoris (HCC) 12/14/2021   Urge incontinence 01/15/2021   History of UTI 01/15/2021    Abnormal urine findings 01/15/2021   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   History of nephrolithiasis 01/15/2021   DVT, lower extremity, proximal, acute, right (HCC) 02/26/2020   Open scalp wound 01/22/2020   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HCC)    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   Parkinson's disease (HCC) 01/16/2018   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) 01/16/2018   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    ONSET DATE: exacerbation 06/21/23, Parkinson's diagnosis 2010  REFERRING DIAG: parkinsons disease with fluctuating dyskinesia  THERAPY DIAG:  Other lack of coordination  Parkinson's disease with dyskinesia, with fluctuations (HCC)  Other symptoms and signs involving the nervous system  Difficulty in walking, not elsewhere classified  Other abnormalities of gait and mobility  Rationale for Evaluation and Treatment: Rehabilitation  SUBJECTIVE:                                                                                                                                                                                             SUBJECTIVE STATEMENT: The botox  got my hand so weak, I can't even pick stuff up with it.    EVAL-Pt and his wife report long history of parkinson's,  biggest difficulty with his mobility that they note is R lower back  pain and labored supine to sit movement, also poor foot clearance.  He doesn't like relying on the walker Pt accompanied by: significant other  PERTINENT HISTORY: parkinson's diagnosed greater than 10 yrs ago, deep brain stimulator R, had in L as well but had to be removed for various reasons.  walks around neighborhood 3 x week with aide Tuesdays and Thursdays parkisons bicycle class 2 x Drue training for upper body work out Referred to PT and ST by neurologist   PAIN:  Are you having pain? R lower back pain constant  PRECAUTIONS: Fall and Other: deep brain stimulator on R   RED FLAGS: None   WEIGHT BEARING RESTRICTIONS:  No  FALLS: Has patient fallen in last 6 months? No  LIVING ENVIRONMENT: Lives with: lives with their spouse Lives in: House/apartment Stairs: has internal steps, but has had home renovated and adapted so the he and  his wife live in downstairs apartment, all one level with bathroom and kitchen Has following equipment at home: Single point cane, Walker - 4 wheeled, Shower bench, Grab bars, and Ramped entry  PLOF: Independent  PATIENT GOALS: improve bed mobility and improve gait, stability, avoid falls  OBJECTIVE:  Note: Objective measures were completed at Evaluation unless otherwise noted.  DIAGNOSTIC FINDINGS: see neuro notes, has deep brain stimulator R   COGNITION: Overall cognitive status: Within functional limits for tasks assessed   SENSATION: WFL  COORDINATION: Slowed movements B and noted curling R hand and R foot at times, with gait festinating on turns   EDEMA:  None noted  MUSCLE TONE: noted curling R fingers, toes, knee flex and hip flex at times, randomly during evaluation  MUSCLE LENGTH: Hamstrings: Right -30 deg; Left -30 deg Thomas test: Right nt deg; Left nt deg    POSTURE: Standing with some forward lean, weight on forefeet, L calf atrophied pt and wife report chronic after L lumbar surgery.     LOWER EXTREMITY ROM:   grossly wfl, hips not assessed as not positioned on mat today   LOWER EXTREMITY MMT:    MMT Right Eval Left Eval  Hip flexion 4 4-  Hip extension    Hip abduction    Hip adduction    Hip internal rotation    Hip external rotation    Knee flexion 4 4  Knee extension 4 4-  Ankle dorsiflexion 4 3+  Ankle plantarflexion 4- 3-  Ankle inversion    Ankle eversion    (Blank rows = not tested)  BED MOBILITY:  Not tested  TRANSFERS: I but slowed, uses hands on seat or with rollator  GAIT: Findings: Gait Characteristics: step to pattern, decreased step length- Right, decreased step length- Left, decreased stride length,  decreased hip/knee flexion- Right, decreased hip/knee flexion- Left, shuffling, festinating, trunk flexed, and narrow BOS, Distance walked: 100', Assistive device utilized:Walker - 4 wheeled, Level of assistance: SBA, and Comments: festinating noted on turns, tends to turn L  FUNCTIONAL TESTS:  30 seconds chair stand test 6 reps Timed up and go (TUG): 33.39 with rollator Standing static feet together 15 sec Standing semi tandem 10 sec each leg, needed CGA with L foot forward Standing marching, required B hand hold support to maintain balance                                                                                                                              TREATMENT DATE: 10/17/23 Recheck goals for PN Standing reaching yellow weighted ball at 4 targets on wall- had to sit after 3 reps due to pt feeling weak esp in his R arm Standing march with walker  NuStep L5 x3mins- mostly legs  LAQ 5# ankle weights    10/12/23 Supine feet on pball rotations, knees to chest HS stretch 30s with strap  AB roll up x10 AB isometric x10 3s holds  NuStep  L6 x93mins  STS with ball toss 2x8 Trunk rotation with yellow ball   10/10/23 Basic PWRMoves - lateral weight shift reaching up - trunk rotation  NuStep L5 x43mins  Shoulder ext 10# 2x10 Punches with 4# dumbbells  Blue ball OHP 2x10 Modified sit ups 2x10  10/05/23 In bars reaching across body to grab cones at high and low surfaces NuStep L5 x33mins  Blue ball slams 2x10 Bouncing ball  STS 2x8   10/03/23:  Nustep L 5 x 6 min In ll bars for lunges on rounded side of BOSU, mass practice In ll bars with one foot on BOSU and reaches down to target, cone, with one hand pt reached within 2 of cone, needed mod assist to return to upright standing  Prone lying, therapist provided post hip stabilization with overpressure for knee flexion stretch, very stiff R LE ,  needed mod assist to return to upright sitting.   [employer] with blue ball mass  practice Standing for ball toss mass practice Seated forward, side/ side , rotational stretch with largest ball    09/29/23 NuStep L5x37mins  On airex marching with RW STS with red ball to floor and overhead 2x8 Standing ball toss and bounces  Seated pball rolls forward and rotations x10   09/26/23 Recheck goals -30s chair test 11 reps -TUG 20s  NuStep L5 x15mins  Standing rows and ext in front of mat table 2x10 STS w/ball toss 2x10 2# boxing punches  Standing marches 3# Standing hip abd 3#   09/22/23 NuStep L5x17mins  OHP alternating 2# 2x10 Arm swings 2# 2x10 STS yellow ball chest press 2x10 Leg ext 10# 3x10 HS curls 25# 3x10 Ball slams with blue ball 2x10   09/19/23:  Prone for ant hip/trunk stretch , therapist assisting with alt knee flexion, R very stiff Prone with roll under ankles, for TKE's 5 sec holds, 10 x 2 Standing in front of mat alt arm swings high amplitude 2# wts x 15 reps each Standing for medicine ball bounces to floor with 2 KG yellow mediball mass practice Sit to stand from hi low table with yellow mediball 10x Nustep L 5 Ue's and LE's x 6 min  08/25/23 NuStep L5x14mins  Leg ext 10# 2x10 HS curls 25# 2x10 Fitter pushes 1 blue and 1 black band 2x10 STS 2x10 Arm swings holding 1 and 2# WaTe 20s x2    08/23/23 Recheck goals - 5xSTS - TUG - MMT NuStep L5x3mins  Modified sit ups holding yellow 2x10 Box tap 6 Step ups 6 Balance on airex feet together, eyes open 220s and then closed 10   08/18/23 Leg ext 10# 2x10 HS curls 25# 2x10 OHP press yellow ball 2x10 Supine LE stretching- HS, LTR, SKTC  NuStep L5 x29mins    08/16/23 Holding big red ball tapping floor to overhead to side to side x5 Standing shoulder rows and ext green 2x10 Standing shoulder flexion with 2# WaTE 2x10 Box taps 4 holding on and then without walker CGA-minA  NuStep L5x47mins STS 2x10   08/11/23 NuStep L5x55mins  Fitter pushes 2 blue bands 2x10  Side steps over  obstacles in bars  On airex reaching for numbers  Isometric ab push downs 2x10   08/09/23 NuStep L5x60mins  STS with chest press red ball 2x8 Standing march 3#  3# hip abd 2x10  Horizontal abd green 2x10 Leg ext 10# 2x10 HS curls 25# 2x10    08/04/23 30s chair test TUG 27s  MMT 4/5 Standing shoulder  ext and rows green band 2x10 Standing shoulder flexion 2# WaTE x10 - stopped due to feeling dizzy and off balance  NuStep L5 x46mins  Seated rotation with yellow ball 2x10   08/02/23 NuStep L5x71mins  Side steps over obstacles in bars Forwards steps over obstacles in bars Calf raises x10 Box taps 4 1HRA Step ups 4 2HRA  STS with OHP 2x5  07/28/23 Supine HS stretch and SKTC  Lower trunk rotations x10 SLR 2x10 Feet on pball rotations and knees to chest AB isometric holds x10  NuStep L5x109mins  STS 3x5   07/27/23 NuStep L5x58mins  LAQ 2# 2x10 HS curls green 2x10  Hip abduction green 2x10 STS 2x10  Stretching with pball rotations and flexion x5     07/06/23 Evaluation, advised to attempt prone lying on bed 10 to 15 min  to stretch body into extension( pt doesn't ever lie prone)   PATIENT EDUCATION: Education details: POC, goals Person educated: Patient and Spouse Education method: Explanation, Demonstration, Tactile cues, and Verbal cues Education comprehension: verbalized understanding, verbal cues required, and needs further education  HOME EXERCISE PROGRAM: Access Code: [email or web address] URL: [email or web address] Date: 08/02/2023 Prepared by: Almetta Fam  Exercises - Supine Hamstring Stretch with Strap  - 2 x daily - 7 x weekly - 2 reps - 15 hold - Modified Thomas Stretch  - 2 x daily - 7 x weekly - 2 reps - 15 hold - Supine Bridge  - 1 x daily - 7 x weekly - 2 sets - 10 reps - Standing Shoulder Horizontal Abduction with Resistance  - 1 x daily - 7 x weekly - 2 sets - 10 reps  GOALS: Goals reviewed with patient? Yes  SHORT TERM GOALS: Target date: 4  weeks, 08/03/23  I home program for posture and for hamstring, hip flexor stretchig Baseline: Goal status: ongoing 08/02/23, ongoing 08/23/23, MET 10/12/23  LONG TERM GOALS: Target date: 10/31/23  30 sec sit to stand improve from 6 reps to 12 reps for improved coordination, strength, endurance LEs Baseline:  Goal status: 7 reps ongoing 08/04/23, 8 reps 08/23/23, 11 reps 09/26/23, 10 reps 10/17/23  2.  I and pain free supine to sit motion , rolling to R Baseline: very painful per pt and wife history, not specifically assessed in clinic today Goal status: ongoing 07/28/23, ongoing 08/09/23, still has pain with bed mobility 08/23/23, can do but still painful 09/26/23, does not hurt as bad 10/12/23  3.  TUG score improve from 33 sec to 18 or less for reduced fall risk   Baseline:  Goal status: 27s ongoing 08/04/23, 22s 08/23/23, 20s 09/26/23, 20s 10/17/23  4.  Improve strength LE's for B quads, hamstrings to 4+5 for improved functional strength Baseline: 4- Goal status: 4/5 08/04/23, 5/5 MET 08/23/23   ASSESSMENT:  CLINICAL IMPRESSION: Patient returns not feeling all that well, reports some weakness in his arm following botox . Tried to work on some standing reaching exercises but he really struggled with this today. He reports that he is having a hard time getting around and has not done much in the past few days. Pt is slower with responses today and taking longer with processing information. Still working on goals, check today for 20th visit PN. More cues needed today when walking due to festinating gait. Unable to demonstrate TKE with RLE when doing LAQ.    EVAL Patient is a 78 y.o. male who was evaluated today by physical therapy due to referral from his neurologist, with decline in  function, particularly control of R extremities, with his Parkinson's diagnosis.  He and his wife have been proactive about maintaining his function, he attends a training/ strengthening session locally 2 x week , attends  parkinson's cycling weekly, and walks around 0.6 miles with his aide 3 x week outdoors with his rollator.  They recently have adapted their home with a main floor renovation so that their bedroom, bath, kitchen are all on one level.   Main problems identified today are weakness B LE's, L ankle is weak chronically due to lumbar radiculopathy and B knee, hips musculature is weak.  He also has coordination deficits with festinating gait particularly on turns and static / dynamic balance deficits.  Has chronic R sided lower back pain which is particularly intense with moving supine to sit.  He should benefit from physical therapy intervention to address his deficits and update his current routine for stretching and strengthening at home.    OBJECTIVE IMPAIRMENTS: Abnormal gait, decreased activity tolerance, decreased balance, decreased coordination, decreased endurance, decreased mobility, difficulty walking, decreased strength, hypomobility, impaired perceived functional ability, impaired UE functional use, postural dysfunction, and pain.   ACTIVITY LIMITATIONS: carrying, lifting, squatting, stairs, transfers, and locomotion level  PARTICIPATION LIMITATIONS: meal prep, cleaning, laundry, shopping, community activity, and church  PERSONAL FACTORS: Age, Behavior pattern, Fitness, Past/current experiences, Time since onset of injury/illness/exacerbation, and 1-2 comorbidities: h/o L lumbar radiculopathy and weakness L LE, chronic R sided lower back pain, are also affecting patient's functional outcome.   REHAB POTENTIAL: Good  CLINICAL DECISION MAKING: Evolving/moderate complexity  EVALUATION COMPLEXITY: Moderate  PLAN:  PT FREQUENCY: 2x/week  PT DURATION: 12 weeks  PLANNED INTERVENTIONS: 97110-Therapeutic exercises, 97530- Therapeutic activity, [id number]- Neuromuscular re-education, 97535- Self Care, and [id number]- Manual therapy  PLAN FOR NEXT SESSION: continue with balance, stretching, coordination  activities, 20th visit PN    Almetta Fam, PT, DPT 10/17/2023, 3:26 PM

## 2023-10-17 ENCOUNTER — Ambulatory Visit

## 2023-10-17 ENCOUNTER — Ambulatory Visit: Admitting: Occupational Therapy

## 2023-10-17 ENCOUNTER — Encounter: Payer: Self-pay | Admitting: Occupational Therapy

## 2023-10-17 DIAGNOSIS — R278 Other lack of coordination: Secondary | ICD-10-CM

## 2023-10-17 DIAGNOSIS — G20B2 Parkinson's disease with dyskinesia, with fluctuations: Secondary | ICD-10-CM

## 2023-10-17 DIAGNOSIS — R29818 Other symptoms and signs involving the nervous system: Secondary | ICD-10-CM

## 2023-10-17 DIAGNOSIS — R262 Difficulty in walking, not elsewhere classified: Secondary | ICD-10-CM

## 2023-10-17 DIAGNOSIS — R2689 Other abnormalities of gait and mobility: Secondary | ICD-10-CM

## 2023-10-17 DIAGNOSIS — R41844 Frontal lobe and executive function deficit: Secondary | ICD-10-CM

## 2023-10-17 DIAGNOSIS — R41840 Attention and concentration deficit: Secondary | ICD-10-CM

## 2023-10-17 NOTE — Therapy (Signed)
 OUTPATIENT OCCUPATIONAL THERAPY [facility]ARKINSON[facility]S treatment  Patient Name: Patrick Ross MRN: [id number] DOB:1946/02/10, 78 y.o., male Today's Date: 10/17/2023  PCP: Dr. Millicent REFERRING PROVIDER:Dr. Tat  END OF SESSION:  OT End of Session - 10/17/23 1357     Visit Number 12    Number of Visits 25    Date for OT Re-Evaluation 11/08/23    Authorization Type MCR, [employer]    Authorization Time Period 12 weeks, anticipate d/c after 8 weeks    Authorization - Visit Number 12    Progress Note Due on Visit 20    OT Start Time 1400    OT Stop Time 1440    OT Time Calculation (min) 40 min               Past Medical History:  Diagnosis Date   Depression    DVT (deep venous thrombosis) (HCC)    GERD (gastroesophageal reflux disease)    History of kidney stones    passed   Hypertension    Hypothyroidism    Lumbar stenosis    OSA (obstructive sleep apnea)    wears cpap   Parkinson's disease (HCC)    PONV (postoperative nausea and vomiting)    REM behavioral disorder    Urinary incontinence    Past Surgical History:  Procedure Laterality Date   BACK SURGERY     x 2 - not fusion   BIOPSY  06/01/2022   Procedure: BIOPSY;  Surgeon: Dianna Specking, MD;  Location: [facility];  Service: Gastroenterology;;   COLONOSCOPY     DEEP BRAIN STIMULATOR PLACEMENT     left 12/27/2012, right 12/10/2011, left revision 02/02/2013, replacement bilateral 11/21/2015   ESOPHAGOGASTRODUODENOSCOPY (EGD) WITH PROPOFOL  N/A 06/01/2022   Procedure: ESOPHAGOGASTRODUODENOSCOPY (EGD) WITH PROPOFOL ;  Surgeon: Dianna Specking, MD;  Location: [facility];  Service: Gastroenterology;  Laterality: N/A;   MINOR PLACEMENT OF FIDUCIAL N/A 01/09/2019   Procedure: Fiducial placement;  Surgeon: Unice Pac, MD;  Location: [facility] OR;  Service: Neurosurgery;  Laterality: N/A;  Fiducial placement   PARTIAL NEPHRECTOMY Right 2005 ish   PULSE GENERATOR IMPLANT Right 01/23/2019   Procedure: Right chest implantable  pulse generator change;  Surgeon: Unice Pac, MD;  Location: [facility] OR;  Service: Neurosurgery;  Laterality: Right;   SPINAL FUSION     lumbar Fusion x 2   SUBTHALAMIC STIMULATOR BATTERY REPLACEMENT Left 02/02/2018   Procedure: Change implantable pulse generator battery, Left chest;  Surgeon: Unice Pac, MD;  Location: [facility] OR;  Service: Neurosurgery;  Laterality: Left;   SUBTHALAMIC STIMULATOR BATTERY REPLACEMENT N/A 01/22/2020   Procedure: REMOVAL OF LEFT BRAIN DEEP BRAIN STIMULATOR ELECTRODE, EXTENSION, AND PULSE GENERATOR;  Surgeon: Unice Pac, MD;  Location: [facility] OR;  Service: Neurosurgery;  Laterality: N/A;   SUBTHALAMIC STIMULATOR BATTERY REPLACEMENT Right 02/23/2022   Procedure: CHANGE IPG BATTERY, RT CHEST;  Surgeon: Dawley, Lani JAYSON, DO;  Location: [facility] OR;  Service: Neurosurgery;  Laterality: Right;   SUBTHALAMIC STIMULATOR INSERTION Left 01/16/2019   Procedure: Left deep brain stimulator electrode revision.;  Surgeon: Unice Pac, MD;  Location: [facility] OR;  Service: Neurosurgery;  Laterality: Left;   SUBTHALAMIC STIMULATOR INSERTION Left 01/23/2019   Procedure: Repositioning of Left Deep Brain Stimulator Electrode;  Surgeon: Unice Pac, MD;  Location: [facility] OR;  Service: Neurosurgery;  Laterality: Left;   TONSILLECTOMY     Patient Active Problem List   Diagnosis Date Noted   Melena 06/01/2022   Acute cystitis without hematuria 05/31/2022   Upper GI bleed 05/31/2022  Generalized weakness 05/30/2022   Abdominal pain 05/30/2022   OSA (obstructive sleep apnea) 05/30/2022   Hypothyroidism 05/30/2022   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) 12/14/2021   Mixed hyperlipidemia 12/14/2021   Essential hypertension 12/14/2021   Palpitations 12/14/2021   Angina pectoris (HCC) 12/14/2021   Urge incontinence 01/15/2021   History of UTI 01/15/2021   Abnormal urine findings 01/15/2021   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   History of nephrolithiasis 01/15/2021   DVT, lower  extremity, proximal, acute, right (HCC) 02/26/2020   Open scalp wound 01/22/2020   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HCC)    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   Parkinson's disease (HCC) 01/16/2018   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) 01/16/2018   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    ONSET DATE: 07/28/23- referral date  REFERRING DIAG:  Diagnosis  Z74.1 (ICD-10-CM) - Requires assistance with activities of daily living (ADL)  G20.B2 (ICD-10-CM) - Parkinson's disease with dyskinesia and fluctuating manifestations (HCC)    THERAPY DIAG:  Other lack of coordination  Other abnormalities of gait and mobility  Other symptoms and signs involving the nervous system  Difficulty in walking, not elsewhere classified  Attention and concentration deficit  Frontal lobe and executive function deficit  Rationale for Evaluation and Treatment: Rehabilitation  SUBJECTIVE:   SUBJECTIVE STATEMENT: Pt reports he got botox  in his right han and now it is weak Pt accompanied by: self  PERTINENT HISTORY: parkinson's diagnosed greater than 10 yrs ago, deep brain stimulator R, had in L as well but had to be removed for various reasons. Depression, sleep apnea, hx of back surgery, see hx above walks around neighborhood 3 x week with aide Tuesdays and Thursdays parkisons bicycle class 2 x Drue training for upper body work out   PRECAUTIONS: Fall- PT is addressing  WEIGHT BEARING RESTRICTIONS: No  PAIN:  Are you having pain? Yes: NPRS scale: 5-6/10,  Pain location: lats Pain description: aching Aggravating factors: malposiitoning Relieving factors: rest,   FALLS: Has patient fallen in last 6 months? No  LIVING ENVIRONMENT: Lives with: lives with their spouse, has CNA 3x weeks Lives in: House/apartment Stairs: Yes: Internal: has stairt lift, and coverted garage appt on ground level Has following equipment at home: U-step  PLOF: Needs assistance with ADLs and Needs assistance with  homemaking  PATIENT GOALS: Pt wants to be able to tie shoes, and more I with dressing  OBJECTIVE:  Note: Objective measures were completed at Evaluation unless otherwise noted.  HAND DOMINANCE: Right  ADLs: Overall ADLs: increased time required  Transfers/ambulation related to ADLs: Eating: difficulty cutting food, needs assist  Grooming: mod I with brushing teeth, combing, CNA assists with shaving UB Dressing: mod I LB Dressing: mod-max A for LB dressing Toileting: mod I with U-step Bathing: supervision-walk in shower, level has built in seat and grab bars Tub Shower transfers: min A Equipment: Grab bars and level walk in shower with built in bench  IADLs:Dependent with home management  Medication management: wife assists Financial management: wife handles Handwriting: Moderate micrographia at sentence level  MOBILITY STATUS: mod I with U-step  POSTURE COMMENTS:  rounded shoulders and forward head   FUNCTIONAL OUTCOME MEASURES: Fastening/unfastening 3 buttons: 1 min 43 secs   COORDINATION: 9 Hole Peg test: Right: 55.08 sec; Left: 49.17 sec Box and Blocks:  Right 26blocks, Left 34blocks  UE ROM:  A/ROM:shoulder flexion RUE-100 LUE105, elbow extension RUE-35 LUE -25, decreased bilateral supination, RUE 95% finger extension, maintains fingers flexed    SENSATION: [facility]  COGNITION: Overall cognitive status: Impaired, slowed processing and short term memory deficits  OBSERVATIONS: Bradykinesia and resting tremor RUE, dystonia RUE                                                                                                                    TREATMENT DATE:10/17/23- Pt practiced donning and doffing shorts over legs using reacher to assist mod A and v.c. Pt was able to remove his shoes mod I however he required min A and mod v.c to donn. Pt presents with increased difficulty using RUE today s/p botox  injection. Pt reports increased weakness and difficulty using his  RUE. Standing at countertop Modified quadruped PWR! rock, min facilitation/ v.c  then PWR! rock standing Dynamic functional reach with left and right UE's to rplace and remove graded clothespins from targets 1-8#, lighter resistance used with RUE due to weakness. UBE x 5 mins level 1 for conditioning.   10/05/23 Standing at countertop Modified quadruped PWR! rock, min facilitation/ v.c  then PWR! rock standing Holding onto countertop marching then stepping forwards and backwards min v.c for amplitude. Dynamic step and reach to left and right sides to flip large playing cards PWR! hands basic 4, 10 reps each followed by fine motor coordination task placing screws into tool board min v.c for amplitude using Bilateral UE's then removing with tools, min v.c for amplitude. Pt reports he plans to continue therapy til 8/20 then take a break.  10/03/23- Therapist checked several goals in prep for PN. see below. UBE x 6 mins level 1 for conditioning Standing at countertop Modified quadraped PWR! rock, min facilitation/ v.c  then PWR! rock stepping forwards and backwards at countertop min A PWR! hands PWR! up x 10 reps, followed by placing metal pegs into pegboard with right and left UE's min difficulty, increased time and min v.c for flicks. Pt took PD meds during session, todays' session was impacted by being and off time prior to meds.   09/26/23- UBE x 6 mins level 1  Supine PWR! up, with mod facilitation, PWR! twist rolling like a log due to history of back surgery. this was discontinuesd as pt had back and hip pain. PWR! step with bridge x 6 reps to simulate bed mobility, then rolling to side lying to sit up, min-mod facilitation/ v.c Modified quadraped PWR! rock, min facilitation/ v.c  dynamic step and reach at countertop min v.c for amplitude. stepping forwards and backwards at countertop min A Flipping and dealing playing cards for increased fine motor coordiantion, mod difficulty, v.c with RUE due  to rigidity/ spasticity.  09/14/23 UBE x 5 mins level 1 for conditioning Standing at counter PWR! rock x 20 reps to targets, modified quadraped PWR! rock x 10 reps, min v.c and facilitation for amplitude seated PWR! up x 10 reps, v.c and demonstration Reviewed PWR! hands for PWR! up, rock, twist and step followed by fatening buttons with big movements mod v.c and demonstration. Therapist started checking short term goals. see goals below. 09/12/23  UBE x 6 mins level 1 Standing at counter PWR! rock x 20 reps to targets, modified quadraped PWR! rock x 10 reps, min v.c and facilitation for amplitude Dynamic step and reach flipping large playing cards, min v.c and facilitation Stepping to targets with big movements, mod v.c fo amplitude   09/07/23 PWR! rock modified quadraped and standing x 10 reps min v.c UBE x 6 mins level 1 for conditioning Functional reaching in standing alternating between left and right hands to place large pegs in vertical pegboard, min v.c for amplitude PWR! hands for PWR! up, rock  and twist 10 reps each min v.c for amplitude Flipping and dealing playing cards with left and right UE's, min difficulty with left, min-mod for RUE, v.c for big movements   08/31/23 PWr! hands basic 4 seated at table followed by flipping cards with big movments, mod v.c and demonstration. Standing at sink, modified quadraped PWR! rock, then standing PWR! rock x 10 reps each, Marching in place with big movements, min-mod v.c for amplitude, then stepping back with alternate feet holding a chair back, then out to the side x 10 reps, min v.c and supervision. Dynamic step and reach to retrieve replace items in cabinets with left and right UE's mod v.c fo amplitude Wiping cabinets for wall slides to promote shoulder flexion. UBE x 5 mins level 1 for conditioning.   08/29/23- UBE x 6 mins level 1 for conditioning, pt mainatined 40 rpm PWR! up in seated, x 10 reps attempted PWR! rock seated but it was  uncomfortable for back so discontiinued. Dynamic step and reach to flip large playing cards at countertop, to left and right sides, then at an angle behind (like PWR! step in standing) mod v.c for amplitude and foot placement  PWR! hands followed by placing metal pegs 3 sizes into pegboard with RUE and removing with in hand manipulation, min-mod difficulty/ v.c for amplitude and flicks. Increased time required 08/24/23-PWR!modified quadraped rock at counter then standing PWR! rock to targets, min-mod v.c for amplitude, min facilitation Seated eedge of met, Doffing then donning sock and shoes with big movments, mod v.c and min-mod facillitaion. Pt tried sock aide to donn left sock, hoever ift was easier to donn right sock manually without the sock aide. Increased time required  Pt attempted to fasten buttons on tabletop, however max difficulty despite v.c so task was discontinued. PWR! hands, followed by picking up coins to place in coin slot, min difficulty v.c  08/22/23- UBE x 5 mins level 1 for conditioning. PWR! up rock and step in seated, mod v.c and demonstraiton. PWR! rock in standing min v.c 10-20 reps each. Marching in standing at the sink, min v.c for amplitude. See pt education  08/16/23- eval only    PATIENT EDUCATION: Education details:  see above Person educated: Patient,  Education method: Explanation demonstration, v.c Education comprehension: verbalized understanding, returned demonstration, v.c  HOME EXERCISE PROGRAM: PWR! hands, beginning coordination-08/22/23  GOALS: Goals reviewed with patient? Yes  SHORT TERM GOALS: Target date: 09/14/23   I with PD specific HEP  Goal status: ongoing- issued needs reinforcement 10/03/23  2.  Pt will demonstrate understanding of adapted strategies/ AE to increase safety and I with ADLs/IADLs. Goal status:  ongoing, initiated  needs reinforcement 10/03/23  3.  Pt will demonstrate improved bilateral UE use as evidenced by increasing box  and blocks score by 3 blocks bilaterally.   Goal status: RUE 40 blocks,  LUE 39 blocks, goal met 09/14/23  4.  Pt will demonstrate improved bilateral UE functional use and ease with dressing as evidenced by decreasing 3 button/ unbutton test time by 5 secs.  Goal status: ongoing >3 mins, 10/03/23  5.  Pt will demonstrate ability to retrieve a lightweight object at 105 shoulder flexion and -30 elbow extension with RUE.  Goal status:  met, 125, -10 10/03/23  6.  Pt will demonstrate ability to retrieve a lightweight object at 110 shoulder flexion and -20 elbow extension with LUE  Goal status:met, 125, -20 10/03/23  LONG TERM GOALS: Target date: 11/08/23   Pt will demonstrate improved fine motor coordination for ADLs as evidenced by decreasing 9 hole peg test score for RUE by 4 secs  Goal status: INITIAL  2.  Pt will demonstrate improved fine motor coordination for ADLs as evidenced by decreasing 9 hole peg test score for LUE by 4 secs  Goal status: INITIAL   3..  Pt will perfrom PPT#4 in 1 min15 secs or less without assist or LOB.  Baseline: 1 min 23 secs with min A- 09/14/23          Goal status; new 09/14/23   4.  Pt will verbalize understanding of ways to prevent future PD related complications  Goal status: INITIAL  5.  Pt will write a short paragraph with 100% legibility and minimal decrease in letter size  Goal status: INITIAL  6. Pt will perform PPT#2 in 15 secs or less Baseline: 17.02 Goal status: initial  ASSESSMENT:  CLINICAL IMPRESSION:   Patient is progressing towards goals. LB dressing remains challenging despite v.c for adated techniques and larger amplitude movements. Pt demonstrates increased RUE weakness s/p botox  injection.   Performance deficits:ADLs, IADLs, coordination, dexterity, tone, ROM, strength, flexibility, Fine motor control, Gross motor control, mobility, balance, endurance, decreased knowledge of precautions, decreased knowledge of use of DME,  vision, and UE functional use, cognitive skills including attention, memory, problem solving, safety awareness, sequencing, and thought, and psychosocial skills including coping strategies, environmental adaptation, habits, interpersonal interactions, and routines and behaviors.   IMPAIRMENTS: are limiting patient from ADLs, IADLs, rest and sleep, play, leisure, and social participation.   COMORBIDITIES:  may have co-morbidities  that affects occupational performance. Patient will benefit from skilled OT to address above impairments and improve overall function.  MODIFICATION OR ASSISTANCE TO COMPLETE EVALUATION: Min-Moderate modification of tasks or assist with assess necessary to complete an evaluation.  OT OCCUPATIONAL PROFILE AND HISTORY: Detailed assessment: Review of records and additional review of physical, cognitive, psychosocial history related to current functional performance.  CLINICAL DECISION MAKING: Moderate - several treatment options, min-mod task modification necessary  REHAB POTENTIAL: Good  EVALUATION COMPLEXITY: Low    PLAN:  OT FREQUENCY: 2x/week  OT DURATION: 12 weeks  PLANNED INTERVENTIONS: 97168 OT Re-evaluation, 97535 self care/ADL training, 02889 therapeutic exercise, 97530 therapeutic activity, 97112 neuromuscular re-education, 97140 manual therapy, 97116 gait training, 02886 aquatic therapy, 97035 ultrasound, 97018 paraffin, 02989 moist heat, 97010 cryotherapy, 97034 contrast bath, 97129 Cognitive training (first 15 min), 02869 Cognitive training(each additional 15 min), passive range of motion, balance training, functional mobility training, visual/perceptual remediation/compensation, energy conservation, coping strategies training, patient/family education, and DME and/or AE instructions  RECOMMENDED OTHER SERVICES: n/a  CONSULTED AND AGREED WITH PLAN OF CARE: Patient  PLAN FOR NEXT SESSION:  continue to reinforce RUE functional use s/p  botox  Marzella Miracle, OT 10/17/2023, 1:57 PM

## 2023-10-19 ENCOUNTER — Ambulatory Visit: Admitting: Occupational Therapy

## 2023-10-19 ENCOUNTER — Ambulatory Visit

## 2023-10-19 DIAGNOSIS — R41840 Attention and concentration deficit: Secondary | ICD-10-CM

## 2023-10-19 DIAGNOSIS — R278 Other lack of coordination: Secondary | ICD-10-CM | POA: Diagnosis not present

## 2023-10-19 DIAGNOSIS — R29818 Other symptoms and signs involving the nervous system: Secondary | ICD-10-CM

## 2023-10-19 DIAGNOSIS — R41844 Frontal lobe and executive function deficit: Secondary | ICD-10-CM

## 2023-10-19 DIAGNOSIS — R2689 Other abnormalities of gait and mobility: Secondary | ICD-10-CM

## 2023-10-19 DIAGNOSIS — R262 Difficulty in walking, not elsewhere classified: Secondary | ICD-10-CM

## 2023-10-19 DIAGNOSIS — G20B2 Parkinson's disease with dyskinesia, with fluctuations: Secondary | ICD-10-CM

## 2023-10-19 NOTE — Therapy (Signed)
 OUTPATIENT OCCUPATIONAL THERAPY [facility] treatment  Patient Name: Patrick Ross MRN: [id number] DOB:1945-05-20, 78 y.o., male Today's Date: 10/19/2023  PCP: Dr. Millicent REFERRING PROVIDER:Dr. Tat  END OF SESSION:  OT End of Session - 10/19/23 1501     Visit Number 13    Number of Visits 25    Date for OT Re-Evaluation 11/08/23    Authorization Type MCR, [employer]    Authorization Time Period 12 weeks, anticipate d/c after 8 weeks    Authorization - Visit Number 13    Progress Note Due on Visit 20    OT Start Time 1405    OT Stop Time 1445    OT Time Calculation (min) 40 min                Past Medical History:  Diagnosis Date   Depression    DVT (deep venous thrombosis) (HCC)    GERD (gastroesophageal reflux disease)    History of kidney stones    passed   Hypertension    Hypothyroidism    Lumbar stenosis    OSA (obstructive sleep apnea)    wears cpap   Parkinson's disease (HCC)    PONV (postoperative nausea and vomiting)    REM behavioral disorder    Urinary incontinence    Past Surgical History:  Procedure Laterality Date   BACK SURGERY     x 2 - not fusion   BIOPSY  06/01/2022   Procedure: BIOPSY;  Surgeon: Dianna Specking, MD;  Location: [facility];  Service: Gastroenterology;;   COLONOSCOPY     DEEP BRAIN STIMULATOR PLACEMENT     left 12/27/2012, right 12/10/2011, left revision 02/02/2013, replacement bilateral 11/21/2015   ESOPHAGOGASTRODUODENOSCOPY (EGD) WITH PROPOFOL  N/A 06/01/2022   Procedure: ESOPHAGOGASTRODUODENOSCOPY (EGD) WITH PROPOFOL ;  Surgeon: Dianna Specking, MD;  Location: [facility];  Service: Gastroenterology;  Laterality: N/A;   MINOR PLACEMENT OF FIDUCIAL N/A 01/09/2019   Procedure: Fiducial placement;  Surgeon: Unice Pac, MD;  Location: [facility] OR;  Service: Neurosurgery;  Laterality: N/A;  Fiducial placement   PARTIAL NEPHRECTOMY Right 2005 ish   PULSE GENERATOR IMPLANT Right 01/23/2019   Procedure: Right chest implantable  pulse generator change;  Surgeon: Unice Pac, MD;  Location: [facility] OR;  Service: Neurosurgery;  Laterality: Right;   SPINAL FUSION     lumbar Fusion x 2   SUBTHALAMIC STIMULATOR BATTERY REPLACEMENT Left 02/02/2018   Procedure: Change implantable pulse generator battery, Left chest;  Surgeon: Unice Pac, MD;  Location: [facility] OR;  Service: Neurosurgery;  Laterality: Left;   SUBTHALAMIC STIMULATOR BATTERY REPLACEMENT N/A 01/22/2020   Procedure: REMOVAL OF LEFT BRAIN DEEP BRAIN STIMULATOR ELECTRODE, EXTENSION, AND PULSE GENERATOR;  Surgeon: Unice Pac, MD;  Location: [facility] OR;  Service: Neurosurgery;  Laterality: N/A;   SUBTHALAMIC STIMULATOR BATTERY REPLACEMENT Right 02/23/2022   Procedure: CHANGE IPG BATTERY, RT CHEST;  Surgeon: Dawley, Lani JAYSON, DO;  Location: [facility] OR;  Service: Neurosurgery;  Laterality: Right;   SUBTHALAMIC STIMULATOR INSERTION Left 01/16/2019   Procedure: Left deep brain stimulator electrode revision.;  Surgeon: Unice Pac, MD;  Location: [facility] OR;  Service: Neurosurgery;  Laterality: Left;   SUBTHALAMIC STIMULATOR INSERTION Left 01/23/2019   Procedure: Repositioning of Left Deep Brain Stimulator Electrode;  Surgeon: Unice Pac, MD;  Location: [facility] OR;  Service: Neurosurgery;  Laterality: Left;   TONSILLECTOMY     Patient Active Problem List   Diagnosis Date Noted   Melena 06/01/2022   Acute cystitis without hematuria 05/31/2022   Upper GI bleed 05/31/2022  Generalized weakness 05/30/2022   Abdominal pain 05/30/2022   OSA (obstructive sleep apnea) 05/30/2022   Hypothyroidism 05/30/2022   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) 12/14/2021   Mixed hyperlipidemia 12/14/2021   Essential hypertension 12/14/2021   Palpitations 12/14/2021   Angina pectoris (HCC) 12/14/2021   Urge incontinence 01/15/2021   History of UTI 01/15/2021   Abnormal urine findings 01/15/2021   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   History of nephrolithiasis 01/15/2021   DVT, lower  extremity, proximal, acute, right (HCC) 02/26/2020   Open scalp wound 01/22/2020   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HCC)    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   Parkinson's disease (HCC) 01/16/2018   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) 01/16/2018   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    ONSET DATE: 07/28/23- referral date  REFERRING DIAG:  Diagnosis  Z74.1 (ICD-10-CM) - Requires assistance with activities of daily living (ADL)  G20.B2 (ICD-10-CM) - Parkinson's disease with dyskinesia and fluctuating manifestations (HCC)    THERAPY DIAG:  Other lack of coordination  Other symptoms and signs involving the nervous system  Other abnormalities of gait and mobility  Attention and concentration deficit  Frontal lobe and executive function deficit  Rationale for Evaluation and Treatment: Rehabilitation  SUBJECTIVE:   SUBJECTIVE STATEMENT: Pt reports his hand feels a little better today Pt accompanied by: self  PERTINENT HISTORY: parkinson's diagnosed greater than 10 yrs ago, deep brain stimulator R, had in L as well but had to be removed for various reasons. Depression, sleep apnea, hx of back surgery, see hx above walks around neighborhood 3 x week with aide Tuesdays and Thursdays parkisons bicycle class 2 x Drue training for upper body work out   PRECAUTIONS: Fall- PT is addressing  WEIGHT BEARING RESTRICTIONS: No  PAIN:  Are you having pain? Yes: NPRS scale: 5-6/10,  Pain location: back Pain description: aching Aggravating factors: malposiitoning Relieving factors: rest,   FALLS: Has patient fallen in last 6 months? No  LIVING ENVIRONMENT: Lives with: lives with their spouse, has CNA 3x weeks Lives in: House/apartment Stairs: Yes: Internal: has stairt lift, and coverted garage appt on ground level Has following equipment at home: U-step  PLOF: Needs assistance with ADLs and Needs assistance with homemaking  PATIENT GOALS: Pt wants to be able to tie shoes, and more  I with dressing  OBJECTIVE:  Note: Objective measures were completed at Evaluation unless otherwise noted.  HAND DOMINANCE: Right  ADLs: Overall ADLs: increased time required  Transfers/ambulation related to ADLs: Eating: difficulty cutting food, needs assist  Grooming: mod I with brushing teeth, combing, CNA assists with shaving UB Dressing: mod I LB Dressing: mod-max A for LB dressing Toileting: mod I with U-step Bathing: supervision-walk in shower, level has built in seat and grab bars Tub Shower transfers: min A Equipment: Grab bars and level walk in shower with built in bench  IADLs:Dependent with home management  Medication management: wife assists Financial management: wife handles Handwriting: Moderate micrographia at sentence level  MOBILITY STATUS: mod I with U-step  POSTURE COMMENTS:  rounded shoulders and forward head   FUNCTIONAL OUTCOME MEASURES: Fastening/unfastening 3 buttons: 1 min 43 secs   COORDINATION: 9 Hole Peg test: Right: 55.08 sec; Left: 49.17 sec Box and Blocks:  Right 26blocks, Left 34blocks  UE ROM:  A/ROM:shoulder flexion RUE-100 LUE105, elbow extension RUE-35 LUE -25, decreased bilateral supination, RUE 95% finger extension, maintains fingers flexed    SENSATION: WFL    COGNITION: Overall cognitive status: Impaired, slowed processing and short term  memory deficits  OBSERVATIONS: Bradykinesia and resting tremor RUE, dystonia RUE                                                                                                                    TREATMENT DATE:10/19/23 UBE x 5 mins level 1 for conditioning PWR! up x 10 reps in seated for warm up , min v.c for amplitude Bag exercises for simulated ADLS in seated: cumpling bag to simulate donning socks, reach overhead imulating donning shirt and then behind back pass to simulate  tucking in shirt min-mod v.c, mod difficulty with RUE functional use. PWR! hands, PWR! up, x 20 reps min v.c,  standing at  countertop, functional reaching to place large pegs into pegboard with right ehn LUE, RUE fatigued quickly and was more challenging since botox . Pt took a rest break then he stood to remove large pegs with right and left UE's from vertical surface, mod difficulty and increased fatigue with RUE.   10/17/23- Pt practiced donning and doffing shorts over legs using reacher to assist mod A and v.c. Pt was able to remove his shoes mod I however he required min A and mod v.c to donn. Pt presents with increased difficulty using RUE today s/p botox  injection. Pt reports increased weakness and difficulty using his RUE. Standing at countertop Modified quadruped PWR! rock, min facilitation/ v.c  then PWR! rock standing Dynamic functional reach with left and right UE's to rplace and remove graded clothespins from targets 1-8#, lighter resistance used with RUE due to weakness. UBE x 5 mins level 1 for conditioning.   10/05/23 Standing at countertop Modified quadruped PWR! rock, min facilitation/ v.c  then PWR! rock standing Holding onto countertop marching then stepping forwards and backwards min v.c for amplitude. Dynamic step and reach to left and right sides to flip large playing cards PWR! hands basic 4, 10 reps each followed by fine motor coordination task placing screws into tool board min v.c for amplitude using Bilateral UE's then removing with tools, min v.c for amplitude. Pt reports he plans to continue therapy til 8/20 then take a break.  10/03/23- Therapist checked several goals in prep for PN. see below. UBE x 6 mins level 1 for conditioning Standing at countertop Modified quadraped PWR! rock, min facilitation/ v.c  then PWR! rock stepping forwards and backwards at countertop min A PWR! hands PWR! up x 10 reps, followed by placing metal pegs into pegboard with right and left UE's min difficulty, increased time and min v.c for flicks. Pt took PD meds during session, todays' session was  impacted by being and off time prior to meds.   09/26/23- UBE x 6 mins level 1  Supine PWR! up, with mod facilitation, PWR! twist rolling like a log due to history of back surgery. this was discontinuesd as pt had back and hip pain. PWR! step with bridge x 6 reps to simulate bed mobility, then rolling to side lying to sit up, min-mod facilitation/ v.c Modified quadraped PWR! rock,  min facilitation/ v.c  dynamic step and reach at countertop min v.c for amplitude. stepping forwards and backwards at countertop min A Flipping and dealing playing cards for increased fine motor coordiantion, mod difficulty, v.c with RUE due to rigidity/ spasticity.  09/14/23 UBE x 5 mins level 1 for conditioning Standing at counter PWR! rock x 20 reps to targets, modified quadraped PWR! rock x 10 reps, min v.c and facilitation for amplitude seated PWR! up x 10 reps, v.c and demonstration Reviewed PWR! hands for PWR! up, rock, twist and step followed by fatening buttons with big movements mod v.c and demonstration. Therapist started checking short term goals. see goals below. 09/12/23 UBE x 6 mins level 1 Standing at counter PWR! rock x 20 reps to targets, modified quadraped PWR! rock x 10 reps, min v.c and facilitation for amplitude Dynamic step and reach flipping large playing cards, min v.c and facilitation Stepping to targets with big movements, mod v.c fo amplitude   09/07/23 PWR! rock modified quadraped and standing x 10 reps min v.c UBE x 6 mins level 1 for conditioning Functional reaching in standing alternating between left and right hands to place large pegs in vertical pegboard, min v.c for amplitude PWR! hands for PWR! up, rock  and twist 10 reps each min v.c for amplitude Flipping and dealing playing cards with left and right UE's, min difficulty with left, min-mod for RUE, v.c for big movements   08/31/23 PWr! hands basic 4 seated at table followed by flipping cards with big movments, mod v.c and  demonstration. Standing at sink, modified quadraped PWR! rock, then standing PWR! rock x 10 reps each, Marching in place with big movements, min-mod v.c for amplitude, then stepping back with alternate feet holding a chair back, then out to the side x 10 reps, min v.c and supervision. Dynamic step and reach to retrieve replace items in cabinets with left and right UE's mod v.c fo amplitude Wiping cabinets for wall slides to promote shoulder flexion. UBE x 5 mins level 1 for conditioning.   08/29/23- UBE x 6 mins level 1 for conditioning, pt mainatined 40 rpm PWR! up in seated, x 10 reps attempted PWR! rock seated but it was uncomfortable for back so discontiinued. Dynamic step and reach to flip large playing cards at countertop, to left and right sides, then at an angle behind (like PWR! step in standing) mod v.c for amplitude and foot placement  PWR! hands followed by placing metal pegs 3 sizes into pegboard with RUE and removing with in hand manipulation, min-mod difficulty/ v.c for amplitude and flicks. Increased time required 08/24/23-PWR!modified quadraped rock at counter then standing PWR! rock to targets, min-mod v.c for amplitude, min facilitation Seated eedge of met, Doffing then donning sock and shoes with big movments, mod v.c and min-mod facillitaion. Pt tried sock aide to donn left sock, hoever ift was easier to donn right sock manually without the sock aide. Increased time required  Pt attempted to fasten buttons on tabletop, however max difficulty despite v.c so task was discontinued. PWR! hands, followed by picking up coins to place in coin slot, min difficulty v.c  08/22/23- UBE x 5 mins level 1 for conditioning. PWR! up rock and step in seated, mod v.c and demonstraiton. PWR! rock in standing min v.c 10-20 reps each. Marching in standing at the sink, min v.c for amplitude. See pt education  08/16/23- eval only    PATIENT EDUCATION: Education details:  see above Person educated:  Patient,  Education method: Explanation demonstration, v.c Education comprehension: verbalized understanding, returned demonstration, v.c  HOME EXERCISE PROGRAM: PWR! hands, beginning coordination-08/22/23  GOALS: Goals reviewed with patient? Yes  SHORT TERM GOALS: Target date: 09/14/23   I with PD specific HEP  Goal status: ongoing- issued needs reinforcement 10/03/23  2.  Pt will demonstrate understanding of adapted strategies/ AE to increase safety and I with ADLs/IADLs. Goal status:  ongoing, initiated  needs reinforcement 10/03/23  3.  Pt will demonstrate improved bilateral UE use as evidenced by increasing box and blocks score by 3 blocks bilaterally.   Goal status: RUE 40 blocks,  LUE 39 blocks, goal met 09/14/23  4.  Pt will demonstrate improved bilateral UE functional use and ease with dressing as evidenced by decreasing 3 button/ unbutton test time by 5 secs.  Goal status: ongoing >3 mins, 10/03/23  5.  Pt will demonstrate ability to retrieve a lightweight object at 105 shoulder flexion and -30 elbow extension with RUE.  Goal status:  met, 125, -10 10/03/23  6.  Pt will demonstrate ability to retrieve a lightweight object at 110 shoulder flexion and -20 elbow extension with LUE  Goal status:met, 125, -20 10/03/23  LONG TERM GOALS: Target date: 11/08/23   Pt will demonstrate improved fine motor coordination for ADLs as evidenced by decreasing 9 hole peg test score for RUE by 4 secs  Goal status: INITIAL  2.  Pt will demonstrate improved fine motor coordination for ADLs as evidenced by decreasing 9 hole peg test score for LUE by 4 secs  Goal status: INITIAL   3..  Pt will perfrom PPT#4 in 1 min15 secs or less without assist or LOB.  Baseline: 1 min 23 secs with min A- 09/14/23          Goal status; new 09/14/23   4.  Pt will verbalize understanding of ways to prevent future PD related complications  Goal status: INITIAL  5.  Pt will write a short paragraph with 100%  legibility and minimal decrease in letter size  Goal status: INITIAL  6. Pt will perform PPT#2 in 15 secs or less Baseline: 17.02 Goal status: initial  ASSESSMENT:  CLINICAL IMPRESSION:   Patient is progressing towards goals.Pt demonstrates improved functional use of RUE today, however it fatigues quickly. Performance deficits:ADLs, IADLs, coordination, dexterity, tone, ROM, strength, flexibility, Fine motor control, Gross motor control, mobility, balance, endurance, decreased knowledge of precautions, decreased knowledge of use of DME, vision, and UE functional use, cognitive skills including attention, memory, problem solving, safety awareness, sequencing, and thought, and psychosocial skills including coping strategies, environmental adaptation, habits, interpersonal interactions, and routines and behaviors.   IMPAIRMENTS: are limiting patient from ADLs, IADLs, rest and sleep, play, leisure, and social participation.   COMORBIDITIES:  may have co-morbidities  that affects occupational performance. Patient will benefit from skilled OT to address above impairments and improve overall function.  MODIFICATION OR ASSISTANCE TO COMPLETE EVALUATION: Min-Moderate modification of tasks or assist with assess necessary to complete an evaluation.  OT OCCUPATIONAL PROFILE AND HISTORY: Detailed assessment: Review of records and additional review of physical, cognitive, psychosocial history related to current functional performance.  CLINICAL DECISION MAKING: Moderate - several treatment options, min-mod task modification necessary  REHAB POTENTIAL: Good  EVALUATION COMPLEXITY: Low    PLAN:  OT FREQUENCY: 2x/week  OT DURATION: 12 weeks  PLANNED INTERVENTIONS: 97168 OT Re-evaluation, 97535 self care/ADL training, 02889 therapeutic exercise, 97530 therapeutic activity, 97112 neuromuscular re-education, 97140 manual therapy, 97116 gait training, 02886  aquatic therapy, 97035 ultrasound, 02981  paraffin, 97010 moist heat, 97010 cryotherapy, 97034 contrast bath, [id number] Cognitive training (first 15 min), 97130 Cognitive training(each additional 15 min), passive range of motion, balance training, functional mobility training, visual/perceptual remediation/compensation, energy conservation, coping strategies training, patient/family education, and DME and/or AE instructions  RECOMMENDED OTHER SERVICES: n/a  CONSULTED AND AGREED WITH PLAN OF CARE: Patient  PLAN FOR NEXT SESSION:  continue to reinforce RUE functional use , work towards unmet goals. Novah Goza, OT 10/19/2023, 3:02 PM

## 2023-10-19 NOTE — Therapy (Signed)
 OUTPATIENT PHYSICAL THERAPY NEURO TREATMENT    Patient Name: Patrick Ross MRN: [id number] DOB:12/18/1945, 78 y.o., male Today's Date: 10/19/2023   PCP: Millicent Sharper MD REFERRING PROVIDER: Evonnie Asberry RAMAN DO  END OF SESSION:  PT End of Session - 10/19/23 1442     Visit Number 21    Date for PT Re-Evaluation 10/31/23    Authorization Type Medicare    Progress Note Due on Visit 20    PT Start Time 1445    PT Stop Time 1530    PT Time Calculation (min) 45 min    Activity Tolerance Patient tolerated treatment well    Behavior During Therapy WFL for tasks assessed/performed                     Past Medical History:  Diagnosis Date   Depression    DVT (deep venous thrombosis) (HCC)    GERD (gastroesophageal reflux disease)    History of kidney stones    passed   Hypertension    Hypothyroidism    Lumbar stenosis    OSA (obstructive sleep apnea)    wears cpap   Parkinson's disease (HCC)    PONV (postoperative nausea and vomiting)    REM behavioral disorder    Urinary incontinence    Past Surgical History:  Procedure Laterality Date   BACK SURGERY     x 2 - not fusion   BIOPSY  06/01/2022   Procedure: BIOPSY;  Surgeon: Dianna Specking, MD;  Location: [facility];  Service: Gastroenterology;;   COLONOSCOPY     DEEP BRAIN STIMULATOR PLACEMENT     left 12/27/2012, right 12/10/2011, left revision 02/02/2013, replacement bilateral 11/21/2015   ESOPHAGOGASTRODUODENOSCOPY (EGD) WITH PROPOFOL  N/A 06/01/2022   Procedure: ESOPHAGOGASTRODUODENOSCOPY (EGD) WITH PROPOFOL ;  Surgeon: Dianna Specking, MD;  Location: [facility];  Service: Gastroenterology;  Laterality: N/A;   MINOR PLACEMENT OF FIDUCIAL N/A 01/09/2019   Procedure: Fiducial placement;  Surgeon: Unice Pac, MD;  Location: [facility] OR;  Service: Neurosurgery;  Laterality: N/A;  Fiducial placement   PARTIAL NEPHRECTOMY Right 2005 ish   PULSE GENERATOR IMPLANT Right 01/23/2019   Procedure: Right chest  implantable pulse generator change;  Surgeon: Unice Pac, MD;  Location: [facility] OR;  Service: Neurosurgery;  Laterality: Right;   SPINAL FUSION     lumbar Fusion x 2   SUBTHALAMIC STIMULATOR BATTERY REPLACEMENT Left 02/02/2018   Procedure: Change implantable pulse generator battery, Left chest;  Surgeon: Unice Pac, MD;  Location: [facility] OR;  Service: Neurosurgery;  Laterality: Left;   SUBTHALAMIC STIMULATOR BATTERY REPLACEMENT N/A 01/22/2020   Procedure: REMOVAL OF LEFT BRAIN DEEP BRAIN STIMULATOR ELECTRODE, EXTENSION, AND PULSE GENERATOR;  Surgeon: Unice Pac, MD;  Location: [facility] OR;  Service: Neurosurgery;  Laterality: N/A;   SUBTHALAMIC STIMULATOR BATTERY REPLACEMENT Right 02/23/2022   Procedure: CHANGE IPG BATTERY, RT CHEST;  Surgeon: Dawley, Lani JAYSON, DO;  Location: [facility] OR;  Service: Neurosurgery;  Laterality: Right;   SUBTHALAMIC STIMULATOR INSERTION Left 01/16/2019   Procedure: Left deep brain stimulator electrode revision.;  Surgeon: Unice Pac, MD;  Location: [facility] OR;  Service: Neurosurgery;  Laterality: Left;   SUBTHALAMIC STIMULATOR INSERTION Left 01/23/2019   Procedure: Repositioning of Left Deep Brain Stimulator Electrode;  Surgeon: Unice Pac, MD;  Location: [facility] OR;  Service: Neurosurgery;  Laterality: Left;   TONSILLECTOMY     Patient Active Problem List   Diagnosis Date Noted   Melena 06/01/2022   Acute cystitis without hematuria 05/31/2022   Upper GI bleed  05/31/2022   Generalized weakness 05/30/2022   Abdominal pain 05/30/2022   OSA (obstructive sleep apnea) 05/30/2022   Hypothyroidism 05/30/2022   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) 12/14/2021   Mixed hyperlipidemia 12/14/2021   Essential hypertension 12/14/2021   Palpitations 12/14/2021   Angina pectoris (HCC) 12/14/2021   Urge incontinence 01/15/2021   History of UTI 01/15/2021   Abnormal urine findings 01/15/2021   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   History of nephrolithiasis 01/15/2021    DVT, lower extremity, proximal, acute, right (HCC) 02/26/2020   Open scalp wound 01/22/2020   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HCC)    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   Parkinson's disease (HCC) 01/16/2018   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) 01/16/2018   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    ONSET DATE: exacerbation 06/21/23, Parkinson's diagnosis 2010  REFERRING DIAG: parkinsons disease with fluctuating dyskinesia  THERAPY DIAG:  Other lack of coordination  Parkinson's disease with dyskinesia, with fluctuations (HCC)  Other symptoms and signs involving the nervous system  Difficulty in walking, not elsewhere classified  Other abnormalities of gait and mobility  Rationale for Evaluation and Treatment: Rehabilitation  SUBJECTIVE:                                                                                                                                                                                             SUBJECTIVE STATEMENT: Still feeling kind of weak.    EVAL-Pt and his wife report long history of parkinson's,  biggest difficulty with his mobility that they note is R lower back  pain and labored supine to sit movement, also poor foot clearance.  He doesn't like relying on the walker Pt accompanied by: significant other  PERTINENT HISTORY: parkinson's diagnosed greater than 10 yrs ago, deep brain stimulator R, had in L as well but had to be removed for various reasons.  walks around neighborhood 3 x week with aide Tuesdays and Thursdays parkisons bicycle class 2 x Drue training for upper body work out Referred to PT and ST by neurologist   PAIN:  Are you having pain? R lower back pain constant  PRECAUTIONS: Fall and Other: deep brain stimulator on R   RED FLAGS: None   WEIGHT BEARING RESTRICTIONS: No  FALLS: Has patient fallen in last 6 months? No  LIVING ENVIRONMENT: Lives with: lives with their spouse Lives in: House/apartment Stairs: has internal  steps, but has had home renovated and adapted so the he and his wife live in downstairs apartment, all one level with bathroom and kitchen Has following equipment at home: Single point cane, Environmental consultant - 4 wheeled, Tour manager, Grab bars,  and Ramped entry  PLOF: Independent  PATIENT GOALS: improve bed mobility and improve gait, stability, avoid falls  OBJECTIVE:  Note: Objective measures were completed at Evaluation unless otherwise noted.  DIAGNOSTIC FINDINGS: see neuro notes, has deep brain stimulator R   COGNITION: Overall cognitive status: Within functional limits for tasks assessed   SENSATION: WFL  COORDINATION: Slowed movements B and noted curling R hand and R foot at times, with gait festinating on turns   EDEMA:  None noted  MUSCLE TONE: noted curling R fingers, toes, knee flex and hip flex at times, randomly during evaluation  MUSCLE LENGTH: Hamstrings: Right -30 deg; Left -30 deg Thomas test: Right nt deg; Left nt deg    POSTURE: Standing with some forward lean, weight on forefeet, L calf atrophied pt and wife report chronic after L lumbar surgery.     LOWER EXTREMITY ROM:   grossly wfl, hips not assessed as not positioned on mat today   LOWER EXTREMITY MMT:    MMT Right Eval Left Eval  Hip flexion 4 4-  Hip extension    Hip abduction    Hip adduction    Hip internal rotation    Hip external rotation    Knee flexion 4 4  Knee extension 4 4-  Ankle dorsiflexion 4 3+  Ankle plantarflexion 4- 3-  Ankle inversion    Ankle eversion    (Blank rows = not tested)  BED MOBILITY:  Not tested  TRANSFERS: I but slowed, uses hands on seat or with rollator  GAIT: Findings: Gait Characteristics: step to pattern, decreased step length- Right, decreased step length- Left, decreased stride length, decreased hip/knee flexion- Right, decreased hip/knee flexion- Left, shuffling, festinating, trunk flexed, and narrow BOS, Distance walked: 100', Assistive device  utilized:Walker - 4 wheeled, Level of assistance: SBA, and Comments: festinating noted on turns, tends to turn L  FUNCTIONAL TESTS:  30 seconds chair stand test 6 reps Timed up and go (TUG): 33.39 with rollator Standing static feet together 15 sec Standing semi tandem 10 sec each leg, needed CGA with L foot forward Standing marching, required B hand hold support to maintain balance                                                                                                                              TREATMENT DATE: 10/19/23 STS with chest press 3# WaTE 2x10 Punches with 4# standing Modified sit up with yellow ball 2x10  NuStep L5 x20mins  Leg ext 10# 2x10 HS curls 25# 2x10 Ball catch Walking 2 laps- cues to take big steps    10/17/23 Recheck goals for PN Standing reaching yellow weighted ball at 4 targets on wall- had to sit after 3 reps due to pt feeling weak esp in his R arm Standing march with walker  NuStep L5 x52mins- mostly legs  LAQ 5# ankle weights    10/12/23 Supine feet on pball rotations, knees to chest HS  stretch 30s with strap  AB roll up x10 AB isometric x10 3s holds  NuStep L6 x43mins  STS with ball toss 2x8 Trunk rotation with yellow ball   10/10/23 Basic PWRMoves - lateral weight shift reaching up - trunk rotation  NuStep L5 x40mins  Shoulder ext 10# 2x10 Punches with 4# dumbbells  Blue ball OHP 2x10 Modified sit ups 2x10  10/05/23 In bars reaching across body to grab cones at high and low surfaces NuStep L5 x40mins  Blue ball slams 2x10 Bouncing ball  STS 2x8   10/03/23:  Nustep L 5 x 6 min In ll bars for lunges on rounded side of BOSU, mass practice In ll bars with one foot on BOSU and reaches down to target, cone, with one hand pt reached within 2 of cone, needed mod assist to return to upright standing  Prone lying, therapist provided post hip stabilization with overpressure for knee flexion stretch, very stiff R LE ,  needed mod assist to  return to upright sitting.   [employer] with blue ball mass practice Standing for ball toss mass practice Seated forward, side/ side , rotational stretch with largest ball    09/29/23 NuStep L5x24mins  On airex marching with RW STS with red ball to floor and overhead 2x8 Standing ball toss and bounces  Seated pball rolls forward and rotations x10   09/26/23 Recheck goals -30s chair test 11 reps -TUG 20s  NuStep L5 x54mins  Standing rows and ext in front of mat table 2x10 STS w/ball toss 2x10 2# boxing punches  Standing marches 3# Standing hip abd 3#   09/22/23 NuStep L5x71mins  OHP alternating 2# 2x10 Arm swings 2# 2x10 STS yellow ball chest press 2x10 Leg ext 10# 3x10 HS curls 25# 3x10 Ball slams with blue ball 2x10   09/19/23:  Prone for ant hip/trunk stretch , therapist assisting with alt knee flexion, R very stiff Prone with roll under ankles, for TKE's 5 sec holds, 10 x 2 Standing in front of mat alt arm swings high amplitude 2# wts x 15 reps each Standing for medicine ball bounces to floor with 2 KG yellow mediball mass practice Sit to stand from hi low table with yellow mediball 10x Nustep L 5 Ue's and LE's x 6 min  08/25/23 NuStep L5x46mins  Leg ext 10# 2x10 HS curls 25# 2x10 Fitter pushes 1 blue and 1 black band 2x10 STS 2x10 Arm swings holding 1 and 2# WaTe 20s x2    08/23/23 Recheck goals - 5xSTS - TUG - MMT NuStep L5x83mins  Modified sit ups holding yellow 2x10 Box tap 6 Step ups 6 Balance on airex feet together, eyes open 220s and then closed 10   08/18/23 Leg ext 10# 2x10 HS curls 25# 2x10 OHP press yellow ball 2x10 Supine LE stretching- HS, LTR, SKTC  NuStep L5 x57mins    08/16/23 Holding big red ball tapping floor to overhead to side to side x5 Standing shoulder rows and ext green 2x10 Standing shoulder flexion with 2# WaTE 2x10 Box taps 4 holding on and then without walker CGA-minA  NuStep L5x75mins STS 2x10   08/11/23 NuStep  L5x71mins  Fitter pushes 2 blue bands 2x10  Side steps over obstacles in bars  On airex reaching for numbers  Isometric ab push downs 2x10   08/09/23 NuStep L5x68mins  STS with chest press red ball 2x8 Standing march 3#  3# hip abd 2x10  Horizontal abd green 2x10 Leg ext 10# 2x10 HS curls  25# 2x10    08/04/23 30s chair test TUG 27s  MMT 4/5 Standing shoulder ext and rows green band 2x10 Standing shoulder flexion 2# WaTE x10 - stopped due to feeling dizzy and off balance  NuStep L5 x13mins  Seated rotation with yellow ball 2x10   08/02/23 NuStep L5x25mins  Side steps over obstacles in bars Forwards steps over obstacles in bars Calf raises x10 Box taps 4 1HRA Step ups 4 2HRA  STS with OHP 2x5  07/28/23 Supine HS stretch and SKTC  Lower trunk rotations x10 SLR 2x10 Feet on pball rotations and knees to chest AB isometric holds x10  NuStep L5x67mins  STS 3x5   07/27/23 NuStep L5x13mins  LAQ 2# 2x10 HS curls green 2x10  Hip abduction green 2x10 STS 2x10  Stretching with pball rotations and flexion x5     07/06/23 Evaluation, advised to attempt prone lying on bed 10 to 15 min  to stretch body into extension( pt doesn't ever lie prone)   PATIENT EDUCATION: Education details: POC, goals Person educated: Patient and Spouse Education method: Explanation, Demonstration, Tactile cues, and Verbal cues Education comprehension: verbalized understanding, verbal cues required, and needs further education  HOME EXERCISE PROGRAM: Access Code: [email or web address] URL: [email or web address] Date: 08/02/2023 Prepared by: Almetta Fam  Exercises - Supine Hamstring Stretch with Strap  - 2 x daily - 7 x weekly - 2 reps - 15 hold - Modified Thomas Stretch  - 2 x daily - 7 x weekly - 2 reps - 15 hold - Supine Bridge  - 1 x daily - 7 x weekly - 2 sets - 10 reps - Standing Shoulder Horizontal Abduction with Resistance  - 1 x daily - 7 x weekly - 2 sets - 10 reps  GOALS: Goals  reviewed with patient? Yes  SHORT TERM GOALS: Target date: 4 weeks, 08/03/23  I home program for posture and for hamstring, hip flexor stretchig Baseline: Goal status: ongoing 08/02/23, ongoing 08/23/23, MET 10/12/23  LONG TERM GOALS: Target date: 10/31/23  30 sec sit to stand improve from 6 reps to 12 reps for improved coordination, strength, endurance LEs Baseline:  Goal status: 7 reps ongoing 08/04/23, 8 reps 08/23/23, 11 reps 09/26/23, 10 reps 10/17/23  2.  I and pain free supine to sit motion , rolling to R Baseline: very painful per pt and wife history, not specifically assessed in clinic today Goal status: ongoing 07/28/23, ongoing 08/09/23, still has pain with bed mobility 08/23/23, can do but still painful 09/26/23, does not hurt as bad 10/12/23  3.  TUG score improve from 33 sec to 18 or less for reduced fall risk   Baseline:  Goal status: 27s ongoing 08/04/23, 22s 08/23/23, 20s 09/26/23, 20s 10/17/23  4.  Improve strength LE's for B quads, hamstrings to 4+5 for improved functional strength Baseline: 4- Goal status: 4/5 08/04/23, 5/5 MET 08/23/23   ASSESSMENT:  CLINICAL IMPRESSION: Patient returns still feeling like botox  made him weak. With standing punches today, he lost his balance while taking a standing rest and lightly stumbled backward against the table. His balance without the walker is compromised. Although, we did some ball catching and while he needs cues to shift weight off of heel he is able to remain standing independently without LOB. Pt is slower with responses today and taking longer with processing information. More cues needed today when walking due to R foot catching, when cued he is able to pick his feet up and clear appropriately.    EVAL  Patient is a 78 y.o. male who was evaluated today by physical therapy due to referral from his neurologist, with decline in function, particularly control of R extremities, with his Parkinson's diagnosis.  He and his wife have been  proactive about maintaining his function, he attends a training/ strengthening session locally 2 x week , attends parkinson's cycling weekly, and walks around 0.6 miles with his aide 3 x week outdoors with his rollator.  They recently have adapted their home with a main floor renovation so that their bedroom, bath, kitchen are all on one level.   Main problems identified today are weakness B LE's, L ankle is weak chronically due to lumbar radiculopathy and B knee, hips musculature is weak.  He also has coordination deficits with festinating gait particularly on turns and static / dynamic balance deficits.  Has chronic R sided lower back pain which is particularly intense with moving supine to sit.  He should benefit from physical therapy intervention to address his deficits and update his current routine for stretching and strengthening at home.    OBJECTIVE IMPAIRMENTS: Abnormal gait, decreased activity tolerance, decreased balance, decreased coordination, decreased endurance, decreased mobility, difficulty walking, decreased strength, hypomobility, impaired perceived functional ability, impaired UE functional use, postural dysfunction, and pain.   ACTIVITY LIMITATIONS: carrying, lifting, squatting, stairs, transfers, and locomotion level  PARTICIPATION LIMITATIONS: meal prep, cleaning, laundry, shopping, community activity, and church  PERSONAL FACTORS: Age, Behavior pattern, Fitness, Past/current experiences, Time since onset of injury/illness/exacerbation, and 1-2 comorbidities: h/o L lumbar radiculopathy and weakness L LE, chronic R sided lower back pain, are also affecting patient's functional outcome.   REHAB POTENTIAL: Good  CLINICAL DECISION MAKING: Evolving/moderate complexity  EVALUATION COMPLEXITY: Moderate  PLAN:  PT FREQUENCY: 2x/week  PT DURATION: 12 weeks  PLANNED INTERVENTIONS: 97110-Therapeutic exercises, 97530- Therapeutic activity, [id number]- Neuromuscular re-education, 97535-  Self Care, and [id number]- Manual therapy  PLAN FOR NEXT SESSION: continue with balance, stretching, coordination activities, 20th visit PN    Almetta Fam, PT, DPT 10/19/2023, 3:31 PM

## 2023-10-25 ENCOUNTER — Ambulatory Visit: Admitting: Occupational Therapy

## 2023-10-25 ENCOUNTER — Encounter: Payer: Self-pay | Admitting: Occupational Therapy

## 2023-10-25 DIAGNOSIS — R29818 Other symptoms and signs involving the nervous system: Secondary | ICD-10-CM

## 2023-10-25 DIAGNOSIS — R278 Other lack of coordination: Secondary | ICD-10-CM | POA: Diagnosis not present

## 2023-10-25 DIAGNOSIS — R41844 Frontal lobe and executive function deficit: Secondary | ICD-10-CM

## 2023-10-25 DIAGNOSIS — R41840 Attention and concentration deficit: Secondary | ICD-10-CM

## 2023-10-25 DIAGNOSIS — R2689 Other abnormalities of gait and mobility: Secondary | ICD-10-CM

## 2023-10-25 NOTE — Therapy (Signed)
 OUTPATIENT OCCUPATIONAL THERAPY [facility]  Patient Name: Patrick Ross MRN: [id number] DOB:[birth year], 78 y.o., male Today's Date: 10/25/2023  PCP: Dr. Millicent REFERRING PROVIDER:Dr. Tat  END OF SESSION:  OT End of Session - 10/25/23 1552     Visit Number 14    Number of Visits 25    Date for OT Re-Evaluation 11/08/23    Authorization Type MCR, [employer]    Authorization Time Period 12 weeks, anticipate d/c after 8 weeks    Authorization - Visit Number 14    Progress Note Due on Visit 20    OT Start Time 1447    OT Stop Time 1530    OT Time Calculation (min) 43 min                 Past Medical History:  Diagnosis Date   Depression    DVT (deep venous thrombosis) (HCC)    GERD (gastroesophageal reflux disease)    History of kidney stones    passed   Hypertension    Hypothyroidism    Lumbar stenosis    OSA (obstructive sleep apnea)    wears cpap   Parkinson's disease (HCC)    PONV (postoperative nausea and vomiting)    REM behavioral disorder    Urinary incontinence    Past Surgical History:  Procedure Laterality Date   BACK SURGERY     x 2 - not fusion   BIOPSY  06/01/2022   Procedure: BIOPSY;  Surgeon: Dianna Specking, MD;  Location: [facility];  Service: Gastroenterology;;   COLONOSCOPY     DEEP BRAIN STIMULATOR PLACEMENT     left 12/27/2012, right 12/10/2011, left revision 02/02/2013, replacement bilateral 11/21/2015   ESOPHAGOGASTRODUODENOSCOPY (EGD) WITH PROPOFOL  N/A 06/01/2022   Procedure: ESOPHAGOGASTRODUODENOSCOPY (EGD) WITH PROPOFOL ;  Surgeon: Dianna Specking, MD;  Location: [facility];  Service: Gastroenterology;  Laterality: N/A;   MINOR PLACEMENT OF FIDUCIAL N/A 01/09/2019   Procedure: Fiducial placement;  Surgeon: Unice Pac, MD;  Location: [facility] OR;  Service: Neurosurgery;  Laterality: N/A;  Fiducial placement   PARTIAL NEPHRECTOMY Right 2005 ish   PULSE GENERATOR IMPLANT Right 01/23/2019   Procedure: Right chest implantable  pulse generator change;  Surgeon: Unice Pac, MD;  Location: [facility] OR;  Service: Neurosurgery;  Laterality: Right;   SPINAL FUSION     lumbar Fusion x 2   SUBTHALAMIC STIMULATOR BATTERY REPLACEMENT Left 02/02/2018   Procedure: Change implantable pulse generator battery, Left chest;  Surgeon: Unice Pac, MD;  Location: [facility] OR;  Service: Neurosurgery;  Laterality: Left;   SUBTHALAMIC STIMULATOR BATTERY REPLACEMENT N/A 01/22/2020   Procedure: REMOVAL OF LEFT BRAIN DEEP BRAIN STIMULATOR ELECTRODE, EXTENSION, AND PULSE GENERATOR;  Surgeon: Unice Pac, MD;  Location: [facility] OR;  Service: Neurosurgery;  Laterality: N/A;   SUBTHALAMIC STIMULATOR BATTERY REPLACEMENT Right 02/23/2022   Procedure: CHANGE IPG BATTERY, RT CHEST;  Surgeon: Dawley, Lani JAYSON, DO;  Location: [facility] OR;  Service: Neurosurgery;  Laterality: Right;   SUBTHALAMIC STIMULATOR INSERTION Left 01/16/2019   Procedure: Left deep brain stimulator electrode revision.;  Surgeon: Unice Pac, MD;  Location: [facility] OR;  Service: Neurosurgery;  Laterality: Left;   SUBTHALAMIC STIMULATOR INSERTION Left 01/23/2019   Procedure: Repositioning of Left Deep Brain Stimulator Electrode;  Surgeon: Unice Pac, MD;  Location: [facility] OR;  Service: Neurosurgery;  Laterality: Left;   TONSILLECTOMY     Patient Active Problem List   Diagnosis Date Noted   Melena 06/01/2022   Acute cystitis without hematuria 05/31/2022   Upper GI bleed 05/31/2022  Generalized weakness 05/30/2022   Abdominal pain 05/30/2022   OSA (obstructive sleep apnea) 05/30/2022   Hypothyroidism 05/30/2022   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) 12/14/2021   Mixed hyperlipidemia 12/14/2021   Essential hypertension 12/14/2021   Palpitations 12/14/2021   Angina pectoris (HCC) 12/14/2021   Urge incontinence 01/15/2021   History of UTI 01/15/2021   Abnormal urine findings 01/15/2021   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   History of nephrolithiasis 01/15/2021   DVT, lower  extremity, proximal, acute, right (HCC) 02/26/2020   Open scalp wound 01/22/2020   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HCC)    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   Parkinson's disease (HCC) 01/16/2018   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) 01/16/2018   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    ONSET DATE: 07/28/23- referral date  REFERRING DIAG:  Diagnosis  Z74.1 (ICD-10-CM) - Requires assistance with activities of daily living (ADL)  G20.B2 (ICD-10-CM) - Parkinson's disease with dyskinesia and fluctuating manifestations (HCC)    THERAPY DIAG:  Other lack of coordination  Other symptoms and signs involving the nervous system  Other abnormalities of gait and mobility  Attention and concentration deficit  Frontal lobe and executive function deficit  Rationale for Evaluation and Treatment: Rehabilitation  SUBJECTIVE:   SUBJECTIVE STATEMENT: Pt reports his hand feels a little better today Pt accompanied by: self  PERTINENT HISTORY: parkinson's diagnosed greater than 10 yrs ago, deep brain stimulator R, had in L as well but had to be removed for various reasons. Depression, sleep apnea, hx of back surgery, see hx above walks around neighborhood 3 x week with aide Tuesdays and Thursdays parkisons bicycle class 2 x Drue training for upper body work out   PRECAUTIONS: Fall- PT is addressing  WEIGHT BEARING RESTRICTIONS: No  PAIN:  Are you having pain? Yes: NPRS scale: 5-6/10,  Pain location: back Pain description: aching Aggravating factors: malposiitoning Relieving factors: rest,   FALLS: Has patient fallen in last 6 months? No  LIVING ENVIRONMENT: Lives with: lives with their spouse, has CNA 3x weeks Lives in: House/apartment Stairs: Yes: Internal: has stairt lift, and coverted garage appt on ground level Has following equipment at home: U-step  PLOF: Needs assistance with ADLs and Needs assistance with homemaking  PATIENT GOALS: Pt wants to be able to tie shoes, and more  I with dressing  OBJECTIVE:  Note: Objective measures were completed at Evaluation unless otherwise noted.  HAND DOMINANCE: Right  ADLs: Overall ADLs: increased time required  Transfers/ambulation related to ADLs: Eating: difficulty cutting food, needs assist  Grooming: mod I with brushing teeth, combing, CNA assists with shaving UB Dressing: mod I LB Dressing: mod-max A for LB dressing Toileting: mod I with U-step Bathing: supervision-walk in shower, level has built in seat and grab bars Tub Shower transfers: min A Equipment: Grab bars and level walk in shower with built in bench  IADLs:Dependent with home management  Medication management: wife assists Financial management: wife handles Handwriting: Moderate micrographia at sentence level  MOBILITY STATUS: mod I with U-step  POSTURE COMMENTS:  rounded shoulders and forward head   FUNCTIONAL OUTCOME MEASURES: Fastening/unfastening 3 buttons: 1 min 43 secs   COORDINATION: 9 Hole Peg test: Right: 55.08 sec; Left: 49.17 sec Box and Blocks:  Right 26blocks, Left 34blocks  UE ROM:  A/ROM:shoulder flexion RUE-100 LUE105, elbow extension RUE-35 LUE -25, decreased bilateral supination, RUE 95% finger extension, maintains fingers flexed    SENSATION: WFL    COGNITION: Overall cognitive status: Impaired, slowed processing and short term  memory deficits  OBSERVATIONS: Bradykinesia and resting tremor RUE, dystonia RUE                                                                                                                    TREATMENT DATE:10/25/23- PWR! up in seated x 10 reps   modified quadraped rock at [employer], then [employer]! rock and step in standing, at countertop, mod v.c and demonstration. Pt's wife observed. Education regarding deep breathing exercises, mod v.c Strategies for self feeding with practice and use of foam grps with min v.c. Foam grips were issued for use at home. discussed importance of opening  hand in full extension for grasp/ relase of a drinking cup. Therapsit recommends acup with a lid if pt in concerned about splls. Pt practiced A/ROM ulnar/ radial deviation at right wrist for increased ease with drinking. Discussed sstrategies for freezing and importance of warm up prior to going to mailbox.    10/19/23 UBE x 5 mins level 1 for conditioning PWR! up x 10 reps in seated for warm up , min v.c for amplitude Bag exercises for simulated ADLS in seated: cumpling bag to simulate donning socks, reach overhead imulating donning shirt and then behind back pass to simulate  tucking in shirt min-mod v.c, mod difficulty with RUE functional use. PWR! hands, PWR! up, x 20 reps min v.c, standing at  countertop, functional reaching to place large pegs into pegboard with right ehn LUE, RUE fatigued quickly and was more challenging since botox . Pt took a rest break then he stood to remove large pegs with right and left UE's from vertical surface, mod difficulty and increased fatigue with RUE.   10/17/23- Pt practiced donning and doffing shorts over legs using reacher to assist mod A and v.c. Pt was able to remove his shoes mod I however he required min A and mod v.c to donn. Pt presents with increased difficulty using RUE today s/p botox  injection. Pt reports increased weakness and difficulty using his RUE. Standing at countertop Modified quadruped PWR! rock, min facilitation/ v.c  then PWR! rock standing Dynamic functional reach with left and right UE's to rplace and remove graded clothespins from targets 1-8#, lighter resistance used with RUE due to weakness. UBE x 5 mins level 1 for conditioning.   10/05/23 Standing at countertop Modified quadruped PWR! rock, min facilitation/ v.c  then PWR! rock standing Holding onto countertop marching then stepping forwards and backwards min v.c for amplitude. Dynamic step and reach to left and right sides to flip large playing cards PWR! hands basic 4, 10 reps  each followed by fine motor coordination task placing screws into tool board min v.c for amplitude using Bilateral UE's then removing with tools, min v.c for amplitude. Pt reports he plans to continue therapy til 8/20 then take a break.  10/03/23- Therapist checked several goals in prep for PN. see below. UBE x 6 mins level 1 for conditioning Standing at countertop Modified quadraped PWR! rock, min facilitation/ v.c  then PWR! rock  stepping forwards and backwards at countertop min A PWR! hands PWR! up x 10 reps, followed by placing metal pegs into pegboard with right and left UE's min difficulty, increased time and min v.c for flicks. Pt took PD meds during session, todays' session was impacted by being and off time prior to meds.   09/26/23- UBE x 6 mins level 1  Supine PWR! up, with mod facilitation, PWR! twist rolling like a log due to history of back surgery. this was discontinuesd as pt had back and hip pain. PWR! step with bridge x 6 reps to simulate bed mobility, then rolling to side lying to sit up, min-mod facilitation/ v.c Modified quadraped PWR! rock, min facilitation/ v.c  dynamic step and reach at countertop min v.c for amplitude. stepping forwards and backwards at countertop min A Flipping and dealing playing cards for increased fine motor coordiantion, mod difficulty, v.c with RUE due to rigidity/ spasticity.  09/14/23 UBE x 5 mins level 1 for conditioning Standing at counter PWR! rock x 20 reps to targets, modified quadraped PWR! rock x 10 reps, min v.c and facilitation for amplitude seated PWR! up x 10 reps, v.c and demonstration Reviewed PWR! hands for PWR! up, rock, twist and step followed by fatening buttons with big movements mod v.c and demonstration. Therapist started checking short term goals. see goals below. 09/12/23 UBE x 6 mins level 1 Standing at counter PWR! rock x 20 reps to targets, modified quadraped PWR! rock x 10 reps, min v.c and facilitation for  amplitude Dynamic step and reach flipping large playing cards, min v.c and facilitation Stepping to targets with big movements, mod v.c fo amplitude   09/07/23 PWR! rock modified quadraped and standing x 10 reps min v.c UBE x 6 mins level 1 for conditioning Functional reaching in standing alternating between left and right hands to place large pegs in vertical pegboard, min v.c for amplitude PWR! hands for PWR! up, rock  and twist 10 reps each min v.c for amplitude Flipping and dealing playing cards with left and right UE's, min difficulty with left, min-mod for RUE, v.c for big movements   08/31/23 PWr! hands basic 4 seated at table followed by flipping cards with big movments, mod v.c and demonstration. Standing at sink, modified quadraped PWR! rock, then standing PWR! rock x 10 reps each, Marching in place with big movements, min-mod v.c for amplitude, then stepping back with alternate feet holding a chair back, then out to the side x 10 reps, min v.c and supervision. Dynamic step and reach to retrieve replace items in cabinets with left and right UE's mod v.c fo amplitude Wiping cabinets for wall slides to promote shoulder flexion. UBE x 5 mins level 1 for conditioning.   08/29/23- UBE x 6 mins level 1 for conditioning, pt mainatined 40 rpm PWR! up in seated, x 10 reps attempted PWR! rock seated but it was uncomfortable for back so discontiinued. Dynamic step and reach to flip large playing cards at countertop, to left and right sides, then at an angle behind (like PWR! step in standing) mod v.c for amplitude and foot placement  PWR! hands followed by placing metal pegs 3 sizes into pegboard with RUE and removing with in hand manipulation, min-mod difficulty/ v.c for amplitude and flicks. Increased time required 08/24/23-PWR!modified quadraped rock at counter then standing PWR! rock to targets, min-mod v.c for amplitude, min facilitation Seated eedge of met, Doffing then donning sock and shoes  with big movments, mod v.c and min-mod  facillitaion. Pt tried sock aide to donn left sock, hoever ift was easier to donn right sock manually without the sock aide. Increased time required  Pt attempted to fasten buttons on tabletop, however max difficulty despite v.c so task was discontinued. PWR! hands, followed by picking up coins to place in coin slot, min difficulty v.c  08/22/23- UBE x 5 mins level 1 for conditioning. PWR! up rock and step in seated, mod v.c and demonstraiton. PWR! rock in standing min v.c 10-20 reps each. Marching in standing at the sink, min v.c for amplitude. See pt education  08/16/23- eval only    PATIENT EDUCATION: Education details:  see above Person educated: Patient,  Education method: Explanation demonstration, v.c Education comprehension: verbalized understanding, returned demonstration, v.c  HOME EXERCISE PROGRAM: PWR! hands, beginning coordination-08/22/23  GOALS: Goals reviewed with patient? Yes  SHORT TERM GOALS: Target date: 09/14/23   I with PD specific HEP  Goal status: ongoing- issued needs reinforcement 10/25/23  2.  Pt will demonstrate understanding of adapted strategies/ AE to increase safety and I with ADLs/IADLs. Goal status:  ongoing, initiated  needs reinforcement 10/03/23  3.  Pt will demonstrate improved bilateral UE use as evidenced by increasing box and blocks score by 3 blocks bilaterally.   Goal status: met-RUE 40 blocks,  LUE 39 blocks, goal 09/14/23  4.  Pt will demonstrate improved bilateral UE functional use and ease with dressing as evidenced by decreasing 3 button/ unbutton test time by 5 secs.  Goal status: ongoing >3 mins, 10/03/23  5.  Pt will demonstrate ability to retrieve a lightweight object at 105 shoulder flexion and -30 elbow extension with RUE.  Goal status:  met, 125, -10 10/03/23  6.  Pt will demonstrate ability to retrieve a lightweight object at 110 shoulder flexion and -20 elbow extension with LUE  Goal  status:met, 125, -20 10/03/23  LONG TERM GOALS: Target date: 11/08/23   Pt will demonstrate improved fine motor coordination for ADLs as evidenced by decreasing 9 hole peg test score for RUE by 4 secs  Goal status: ongoing, 10/25/23  2.  Pt will demonstrate improved fine motor coordination for ADLs as evidenced by decreasing 9 hole peg test score for LUE by 4 secs  Goal status: ongoing 10/25/23   3..  Pt will perfrom PPT#4 in 1 min15 secs or less without assist or LOB.  Baseline: 1 min 23 secs with min A- 09/14/23          Goal status;  ongoing 10/25/23   4.  Pt will verbalize understanding of ways to prevent future PD related complications  Goal status: ongoing, need reinforcement. 10/25/23  5.  Pt will write a short paragraph with 100% legibility and minimal decrease in letter size  Goal status: ongoing, increased difficulty with RUE use since botox  10/25/23  6. Pt will perform PPT#2 in 15 secs or less Baseline: 17.02 Goal status: ongoing 10/25/23 increased difficulty since botox   ASSESSMENT:  CLINICAL IMPRESSION:   Patient is progressing towards goals.Pt/ wife verbalize understanding of adapted strategies for ADLs. Performance deficits:ADLs, IADLs, coordination, dexterity, tone, ROM, strength, flexibility, Fine motor control, Gross motor control, mobility, balance, endurance, decreased knowledge of precautions, decreased knowledge of use of DME, vision, and UE functional use, cognitive skills including attention, memory, problem solving, safety awareness, sequencing, and thought, and psychosocial skills including coping strategies, environmental adaptation, habits, interpersonal interactions, and routines and behaviors.   IMPAIRMENTS: are limiting patient from ADLs, IADLs, rest and sleep, play, leisure,  and social participation.   COMORBIDITIES:  may have co-morbidities  that affects occupational performance. Patient will benefit from skilled OT to address above impairments and improve  overall function.  MODIFICATION OR ASSISTANCE TO COMPLETE EVALUATION: Min-Moderate modification of tasks or assist with assess necessary to complete an evaluation.  OT OCCUPATIONAL PROFILE AND HISTORY: Detailed assessment: Review of records and additional review of physical, cognitive, psychosocial history related to current functional performance.  CLINICAL DECISION MAKING: Moderate - several treatment options, min-mod task modification necessary  REHAB POTENTIAL: Good  EVALUATION COMPLEXITY: Low    PLAN:  OT FREQUENCY: 2x/week  OT DURATION: 12 weeks  PLANNED INTERVENTIONS: 97168 OT Re-evaluation, 97535 self care/ADL training, 02889 therapeutic exercise, 97530 therapeutic activity, 97112 neuromuscular re-education, 97140 manual therapy, 97116 gait training, 02886 aquatic therapy, 97035 ultrasound, 97018 paraffin, 02989 moist heat, 97010 cryotherapy, 97034 contrast bath, 97129 Cognitive training (first 15 min), 02869 Cognitive training(each additional 15 min), passive range of motion, balance training, functional mobility training, visual/perceptual remediation/compensation, energy conservation, coping strategies training, patient/family education, and DME and/or AE instructions  RECOMMENDED OTHER SERVICES: n/a  CONSULTED AND AGREED WITH PLAN OF CARE: Patient  PLAN FOR NEXT SESSION:  hcheck goals and d/c vs. renew next visit Agam Tuohy, OT 10/25/2023, 3:52 PM

## 2023-10-27 ENCOUNTER — Ambulatory Visit: Admitting: Physical Therapy

## 2023-10-27 ENCOUNTER — Encounter: Payer: Self-pay | Admitting: Physical Therapy

## 2023-10-27 DIAGNOSIS — R278 Other lack of coordination: Secondary | ICD-10-CM

## 2023-10-27 DIAGNOSIS — R29818 Other symptoms and signs involving the nervous system: Secondary | ICD-10-CM

## 2023-10-27 DIAGNOSIS — R2689 Other abnormalities of gait and mobility: Secondary | ICD-10-CM

## 2023-10-27 NOTE — Therapy (Signed)
 OUTPATIENT PHYSICAL THERAPY NEURO TREATMENT    Patient Name: Patrick Ross MRN: [id number] DOB:Oct 06, 1945, 78 y.o., male Today's Date: 10/27/2023   PCP: Millicent Sharper MD REFERRING PROVIDER: Evonnie Asberry RAMAN DO  END OF SESSION:  PT End of Session - 10/27/23 1405     Visit Number 22    Date for PT Re-Evaluation 10/31/23    Authorization Type Medicare    Progress Note Due on Visit 30    PT Start Time 1348    PT Stop Time 1428    PT Time Calculation (min) 40 min    Activity Tolerance Patient tolerated treatment well    Behavior During Therapy WFL for tasks assessed/performed                      Past Medical History:  Diagnosis Date   Depression    DVT (deep venous thrombosis) (HCC)    GERD (gastroesophageal reflux disease)    History of kidney stones    passed   Hypertension    Hypothyroidism    Lumbar stenosis    OSA (obstructive sleep apnea)    wears cpap   Parkinson's disease (HCC)    PONV (postoperative nausea and vomiting)    REM behavioral disorder    Urinary incontinence    Past Surgical History:  Procedure Laterality Date   BACK SURGERY     x 2 - not fusion   BIOPSY  06/01/2022   Procedure: BIOPSY;  Surgeon: Dianna Specking, MD;  Location: [facility];  Service: Gastroenterology;;   COLONOSCOPY     DEEP BRAIN STIMULATOR PLACEMENT     left 12/27/2012, right 12/10/2011, left revision 02/02/2013, replacement bilateral 11/21/2015   ESOPHAGOGASTRODUODENOSCOPY (EGD) WITH PROPOFOL  N/A 06/01/2022   Procedure: ESOPHAGOGASTRODUODENOSCOPY (EGD) WITH PROPOFOL ;  Surgeon: Dianna Specking, MD;  Location: [facility];  Service: Gastroenterology;  Laterality: N/A;   MINOR PLACEMENT OF FIDUCIAL N/A 01/09/2019   Procedure: Fiducial placement;  Surgeon: Unice Pac, MD;  Location: [facility] OR;  Service: Neurosurgery;  Laterality: N/A;  Fiducial placement   PARTIAL NEPHRECTOMY Right 2005 ish   PULSE GENERATOR IMPLANT Right 01/23/2019   Procedure: Right chest  implantable pulse generator change;  Surgeon: Unice Pac, MD;  Location: [facility] OR;  Service: Neurosurgery;  Laterality: Right;   SPINAL FUSION     lumbar Fusion x 2   SUBTHALAMIC STIMULATOR BATTERY REPLACEMENT Left 02/02/2018   Procedure: Change implantable pulse generator battery, Left chest;  Surgeon: Unice Pac, MD;  Location: [facility] OR;  Service: Neurosurgery;  Laterality: Left;   SUBTHALAMIC STIMULATOR BATTERY REPLACEMENT N/A 01/22/2020   Procedure: REMOVAL OF LEFT BRAIN DEEP BRAIN STIMULATOR ELECTRODE, EXTENSION, AND PULSE GENERATOR;  Surgeon: Unice Pac, MD;  Location: [facility] OR;  Service: Neurosurgery;  Laterality: N/A;   SUBTHALAMIC STIMULATOR BATTERY REPLACEMENT Right 02/23/2022   Procedure: CHANGE IPG BATTERY, RT CHEST;  Surgeon: Dawley, Lani JAYSON, DO;  Location: [facility] OR;  Service: Neurosurgery;  Laterality: Right;   SUBTHALAMIC STIMULATOR INSERTION Left 01/16/2019   Procedure: Left deep brain stimulator electrode revision.;  Surgeon: Unice Pac, MD;  Location: [facility] OR;  Service: Neurosurgery;  Laterality: Left;   SUBTHALAMIC STIMULATOR INSERTION Left 01/23/2019   Procedure: Repositioning of Left Deep Brain Stimulator Electrode;  Surgeon: Unice Pac, MD;  Location: [facility] OR;  Service: Neurosurgery;  Laterality: Left;   TONSILLECTOMY     Patient Active Problem List   Diagnosis Date Noted   Melena 06/01/2022   Acute cystitis without hematuria 05/31/2022   Upper GI  bleed 05/31/2022   Generalized weakness 05/30/2022   Abdominal pain 05/30/2022   OSA (obstructive sleep apnea) 05/30/2022   Hypothyroidism 05/30/2022   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) 12/14/2021   Mixed hyperlipidemia 12/14/2021   Essential hypertension 12/14/2021   Palpitations 12/14/2021   Angina pectoris (HCC) 12/14/2021   Urge incontinence 01/15/2021   History of UTI 01/15/2021   Abnormal urine findings 01/15/2021   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   History of nephrolithiasis 01/15/2021    DVT, lower extremity, proximal, acute, right (HCC) 02/26/2020   Open scalp wound 01/22/2020   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HCC)    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   Parkinson's disease (HCC) 01/16/2018   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) 01/16/2018   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    ONSET DATE: exacerbation 06/21/23, Parkinson's diagnosis 2010  REFERRING DIAG: parkinsons disease with fluctuating dyskinesia  THERAPY DIAG:  Other lack of coordination  Other symptoms and signs involving the nervous system  Other abnormalities of gait and mobility  Rationale for Evaluation and Treatment: Rehabilitation  SUBJECTIVE:                                                                                                                                                                                             SUBJECTIVE STATEMENT:  Having some back pain and R hip pain today    EVAL-Pt and his wife report long history of parkinson's,  biggest difficulty with his mobility that they note is R lower back  pain and labored supine to sit movement, also poor foot clearance.  He doesn't like relying on the walker Pt accompanied by: significant other  PERTINENT HISTORY: parkinson's diagnosed greater than 10 yrs ago, deep brain stimulator R, had in L as well but had to be removed for various reasons.  walks around neighborhood 3 x week with aide Tuesdays and Thursdays parkisons bicycle class 2 x Drue training for upper body work out Referred to PT and ST by neurologist   PAIN:  Are you having pain? R lower back pain and hip  8/10, constant , getting OOB in the morning makes it worse, nothing makes it better  PRECAUTIONS: Fall and Other: deep brain stimulator on R   RED FLAGS: None   WEIGHT BEARING RESTRICTIONS: No  FALLS: Has patient fallen in last 6 months? No  LIVING ENVIRONMENT: Lives with: lives with their spouse Lives in: House/apartment Stairs: has internal steps, but has  had home renovated and adapted so the he and his wife live in downstairs apartment, all one level with bathroom and kitchen Has following equipment at home: Single point cane,  Walker - 4 wheeled, Tour manager, Grab bars, and Ramped entry  PLOF: Independent  PATIENT GOALS: improve bed mobility and improve gait, stability, avoid falls  OBJECTIVE:  Note: Objective measures were completed at Evaluation unless otherwise noted.  DIAGNOSTIC FINDINGS: see neuro notes, has deep brain stimulator R   COGNITION: Overall cognitive status: Within functional limits for tasks assessed   SENSATION: WFL  COORDINATION: Slowed movements B and noted curling R hand and R foot at times, with gait festinating on turns   EDEMA:  None noted  MUSCLE TONE: noted curling R fingers, toes, knee flex and hip flex at times, randomly during evaluation  MUSCLE LENGTH: Hamstrings: Right -30 deg; Left -30 deg Thomas test: Right nt deg; Left nt deg    POSTURE: Standing with some forward lean, weight on forefeet, L calf atrophied pt and wife report chronic after L lumbar surgery.     LOWER EXTREMITY ROM:   grossly wfl, hips not assessed as not positioned on mat today   LOWER EXTREMITY MMT:    MMT Right Eval Left Eval  Hip flexion 4 4-  Hip extension    Hip abduction    Hip adduction    Hip internal rotation    Hip external rotation    Knee flexion 4 4  Knee extension 4 4-  Ankle dorsiflexion 4 3+  Ankle plantarflexion 4- 3-  Ankle inversion    Ankle eversion    (Blank rows = not tested)  BED MOBILITY:  Not tested  TRANSFERS: I but slowed, uses hands on seat or with rollator  GAIT: Findings: Gait Characteristics: step to pattern, decreased step length- Right, decreased step length- Left, decreased stride length, decreased hip/knee flexion- Right, decreased hip/knee flexion- Left, shuffling, festinating, trunk flexed, and narrow BOS, Distance walked: 100', Assistive device utilized:Walker -  4 wheeled, Level of assistance: SBA, and Comments: festinating noted on turns, tends to turn L  FUNCTIONAL TESTS:  30 seconds chair stand test 6 reps Timed up and go (TUG): 33.39 with rollator Standing static feet together 15 sec Standing semi tandem 10 sec each leg, needed CGA with L foot forward Standing marching, required B hand hold support to maintain balance                                                                                                                              TREATMENT DATE:  10/27/23  Seated PWR up with 2# Wate bar- reaching forward to tap bolster then PWR up x8 Seated PWR reach x6 B  PWR step + twist x8 B STS from high mat table + yellow ball chest press x10   Nustep L5x8 minutes BLEs only  Walking forward and backward with U-walker, cues for big steps       10/19/23 STS with chest press 3# WaTE 2x10 Punches with 4# standing Modified sit up with yellow ball 2x10  NuStep L5 x23mins  Leg ext 10# 2x10 HS curls  25# 2x10 Ball catch Walking 2 laps- cues to take big steps    10/17/23 Recheck goals for PN Standing reaching yellow weighted ball at 4 targets on wall- had to sit after 3 reps due to pt feeling weak esp in his R arm Standing march with walker  NuStep L5 x61mins- mostly legs  LAQ 5# ankle weights      PATIENT EDUCATION: Education details: POC, goals Person educated: Patient and Spouse Education method: Explanation, Demonstration, Tactile cues, and Verbal cues Education comprehension: verbalized understanding, verbal cues required, and needs further education  HOME EXERCISE PROGRAM: Access Code: [email or web address] URL: [email or web address] Date: 08/02/2023 Prepared by: Almetta Fam  Exercises - Supine Hamstring Stretch with Strap  - 2 x daily - 7 x weekly - 2 reps - 15 hold - Modified Thomas Stretch  - 2 x daily - 7 x weekly - 2 reps - 15 hold - Supine Bridge  - 1 x daily - 7 x weekly - 2 sets - 10 reps - Standing Shoulder  Horizontal Abduction with Resistance  - 1 x daily - 7 x weekly - 2 sets - 10 reps  GOALS: Goals reviewed with patient? Yes  SHORT TERM GOALS: Target date: 4 weeks, 08/03/23  I home program for posture and for hamstring, hip flexor stretchig Baseline: Goal status: ongoing 08/02/23, ongoing 08/23/23, MET 10/12/23  LONG TERM GOALS: Target date: 10/31/23  30 sec sit to stand improve from 6 reps to 12 reps for improved coordination, strength, endurance LEs Baseline:  Goal status: 7 reps ongoing 08/04/23, 8 reps 08/23/23, 11 reps 09/26/23, 10 reps 10/17/23  2.  I and pain free supine to sit motion , rolling to R Baseline: very painful per pt and wife history, not specifically assessed in clinic today Goal status: ongoing 07/28/23, ongoing 08/09/23, still has pain with bed mobility 08/23/23, can do but still painful 09/26/23, does not hurt as bad 10/12/23  3.  TUG score improve from 33 sec to 18 or less for reduced fall risk   Baseline:  Goal status: 27s ongoing 08/04/23, 22s 08/23/23, 20s 09/26/23, 20s 10/17/23  4.  Improve strength LE's for B quads, hamstrings to 4+5 for improved functional strength Baseline: 4- Goal status: 4/5 08/04/23, 5/5 MET 08/23/23   ASSESSMENT:  CLINICAL IMPRESSION:  Arrives today doing OK, still feeling weak from the botox  he got. Worked on some PWR based moves primarily today, kept incorporating work on Nustep as well. Gait pattern was better and he followed cues a little more smoothly today as compared to prior session. Will continue efforts.    EVAL Patient is a 78 y.o. male who was evaluated today by physical therapy due to referral from his neurologist, with decline in function, particularly control of R extremities, with his Parkinson's diagnosis.  He and his wife have been proactive about maintaining his function, he attends a training/ strengthening session locally 2 x week , attends parkinson's cycling weekly, and walks around 0.6 miles with his aide 3 x week outdoors with  his rollator.  They recently have adapted their home with a main floor renovation so that their bedroom, bath, kitchen are all on one level.   Main problems identified today are weakness B LE's, L ankle is weak chronically due to lumbar radiculopathy and B knee, hips musculature is weak.  He also has coordination deficits with festinating gait particularly on turns and static / dynamic balance deficits.  Has chronic R sided lower back pain which is particularly intense with  moving supine to sit.  He should benefit from physical therapy intervention to address his deficits and update his current routine for stretching and strengthening at home.    OBJECTIVE IMPAIRMENTS: Abnormal gait, decreased activity tolerance, decreased balance, decreased coordination, decreased endurance, decreased mobility, difficulty walking, decreased strength, hypomobility, impaired perceived functional ability, impaired UE functional use, postural dysfunction, and pain.   ACTIVITY LIMITATIONS: carrying, lifting, squatting, stairs, transfers, and locomotion level  PARTICIPATION LIMITATIONS: meal prep, cleaning, laundry, shopping, community activity, and church  PERSONAL FACTORS: Age, Behavior pattern, Fitness, Past/current experiences, Time since onset of injury/illness/exacerbation, and 1-2 comorbidities: h/o L lumbar radiculopathy and weakness L LE, chronic R sided lower back pain, are also affecting patient's functional outcome.   REHAB POTENTIAL: Good  CLINICAL DECISION MAKING: Evolving/moderate complexity  EVALUATION COMPLEXITY: Moderate  PLAN:  PT FREQUENCY: 2x/week  PT DURATION: 12 weeks  PLANNED INTERVENTIONS: 97110-Therapeutic exercises, 97530- Therapeutic activity, [id number]- Neuromuscular re-education, 97535- Self Care, and [id number]- Manual therapy  PLAN FOR NEXT SESSION: continue with balance, stretching, coordination activities, PWR based activities    Josette Rough, PT, DPT 10/27/23 2:28 PM

## 2023-11-03 ENCOUNTER — Ambulatory Visit: Admitting: Occupational Therapy

## 2023-11-03 ENCOUNTER — Encounter: Payer: Self-pay | Admitting: Physical Therapy

## 2023-11-03 ENCOUNTER — Ambulatory Visit: Attending: Family Medicine | Admitting: Physical Therapy

## 2023-11-03 ENCOUNTER — Encounter: Payer: Self-pay | Admitting: Occupational Therapy

## 2023-11-03 DIAGNOSIS — R41840 Attention and concentration deficit: Secondary | ICD-10-CM | POA: Insufficient documentation

## 2023-11-03 DIAGNOSIS — R41844 Frontal lobe and executive function deficit: Secondary | ICD-10-CM

## 2023-11-03 DIAGNOSIS — G20B2 Parkinson's disease with dyskinesia, with fluctuations: Secondary | ICD-10-CM | POA: Insufficient documentation

## 2023-11-03 DIAGNOSIS — R262 Difficulty in walking, not elsewhere classified: Secondary | ICD-10-CM | POA: Insufficient documentation

## 2023-11-03 DIAGNOSIS — R29818 Other symptoms and signs involving the nervous system: Secondary | ICD-10-CM

## 2023-11-03 DIAGNOSIS — R2689 Other abnormalities of gait and mobility: Secondary | ICD-10-CM | POA: Diagnosis present

## 2023-11-03 DIAGNOSIS — R278 Other lack of coordination: Secondary | ICD-10-CM

## 2023-11-03 NOTE — Therapy (Addendum)
 OUTPATIENT OCCUPATIONAL THERAPY [facility]  Patient Name: Patrick Ross MRN: [id number] DOB:06/08/1945, 78 y.o., male Today's Date: 11/03/2023  PCP: Dr. Millicent REFERRING PROVIDER:Dr. Tat  END OF SESSION:  OT End of Session - 11/03/23 1407     Visit Number 15    Number of Visits 25    Date for OT Re-Evaluation 01/26/24   written for 12 weeks to account for scheduling   Authorization Type MCR, [employer]    Authorization Time Period 12 weeks, anticipate d/c after 8 weeks    Authorization - Visit Number 15    Progress Note Due on Visit 20    OT Start Time 1403    OT Stop Time 1445    OT Time Calculation (min) 42 min    Activity Tolerance Patient tolerated treatment well    Behavior During Therapy WFL for tasks assessed/performed                  Past Medical History:  Diagnosis Date   Depression    DVT (deep venous thrombosis) (HCC)    GERD (gastroesophageal reflux disease)    History of kidney stones    passed   Hypertension    Hypothyroidism    Lumbar stenosis    OSA (obstructive sleep apnea)    wears cpap   Parkinson's disease (HCC)    PONV (postoperative nausea and vomiting)    REM behavioral disorder    Urinary incontinence    Past Surgical History:  Procedure Laterality Date   BACK SURGERY     x 2 - not fusion   BIOPSY  06/01/2022   Procedure: BIOPSY;  Surgeon: Dianna Specking, MD;  Location: [facility];  Service: Gastroenterology;;   COLONOSCOPY     DEEP BRAIN STIMULATOR PLACEMENT     left 12/27/2012, right 12/10/2011, left revision 02/02/2013, replacement bilateral 11/21/2015   ESOPHAGOGASTRODUODENOSCOPY (EGD) WITH PROPOFOL  N/A 06/01/2022   Procedure: ESOPHAGOGASTRODUODENOSCOPY (EGD) WITH PROPOFOL ;  Surgeon: Dianna Specking, MD;  Location: [facility];  Service: Gastroenterology;  Laterality: N/A;   MINOR PLACEMENT OF FIDUCIAL N/A 01/09/2019   Procedure: Fiducial placement;  Surgeon: Unice Pac, MD;  Location: [facility] OR;  Service:  Neurosurgery;  Laterality: N/A;  Fiducial placement   PARTIAL NEPHRECTOMY Right 2005 ish   PULSE GENERATOR IMPLANT Right 01/23/2019   Procedure: Right chest implantable pulse generator change;  Surgeon: Unice Pac, MD;  Location: [facility] OR;  Service: Neurosurgery;  Laterality: Right;   SPINAL FUSION     lumbar Fusion x 2   SUBTHALAMIC STIMULATOR BATTERY REPLACEMENT Left 02/02/2018   Procedure: Change implantable pulse generator battery, Left chest;  Surgeon: Unice Pac, MD;  Location: [facility] OR;  Service: Neurosurgery;  Laterality: Left;   SUBTHALAMIC STIMULATOR BATTERY REPLACEMENT N/A 01/22/2020   Procedure: REMOVAL OF LEFT BRAIN DEEP BRAIN STIMULATOR ELECTRODE, EXTENSION, AND PULSE GENERATOR;  Surgeon: Unice Pac, MD;  Location: [facility] OR;  Service: Neurosurgery;  Laterality: N/A;   SUBTHALAMIC STIMULATOR BATTERY REPLACEMENT Right 02/23/2022   Procedure: CHANGE IPG BATTERY, RT CHEST;  Surgeon: Dawley, Lani JAYSON, DO;  Location: [facility] OR;  Service: Neurosurgery;  Laterality: Right;   SUBTHALAMIC STIMULATOR INSERTION Left 01/16/2019   Procedure: Left deep brain stimulator electrode revision.;  Surgeon: Unice Pac, MD;  Location: [facility] OR;  Service: Neurosurgery;  Laterality: Left;   SUBTHALAMIC STIMULATOR INSERTION Left 01/23/2019   Procedure: Repositioning of Left Deep Brain Stimulator Electrode;  Surgeon: Unice Pac, MD;  Location: [facility] OR;  Service: Neurosurgery;  Laterality: Left;   TONSILLECTOMY  Patient Active Problem List   Diagnosis Date Noted   Melena 06/01/2022   Acute cystitis without hematuria 05/31/2022   Upper GI bleed 05/31/2022   Generalized weakness 05/30/2022   Abdominal pain 05/30/2022   OSA (obstructive sleep apnea) 05/30/2022   Hypothyroidism 05/30/2022   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) 12/14/2021   Mixed hyperlipidemia 12/14/2021   Essential hypertension 12/14/2021   Palpitations 12/14/2021   Angina pectoris (HCC) 12/14/2021   Urge incontinence  01/15/2021   History of UTI 01/15/2021   Abnormal urine findings 01/15/2021   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   History of nephrolithiasis 01/15/2021   DVT, lower extremity, proximal, acute, right (HCC) 02/26/2020   Open scalp wound 01/22/2020   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HCC)    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   Parkinson's disease (HCC) 01/16/2018   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) 01/16/2018   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    ONSET DATE: 07/28/23- referral date  REFERRING DIAG:  Diagnosis  Z74.1 (ICD-10-CM) - Requires assistance with activities of daily living (ADL)  G20.B2 (ICD-10-CM) - Parkinson's disease with dyskinesia and fluctuating manifestations (HCC)    THERAPY DIAG:  Other lack of coordination - Plan: Ot plan of care cert/re-cert  Other symptoms and signs involving the nervous system - Plan: Ot plan of care cert/re-cert  Other abnormalities of gait and mobility - Plan: Ot plan of care cert/re-cert  Attention and concentration deficit - Plan: Ot plan of care cert/re-cert  Frontal lobe and executive function deficit - Plan: Ot plan of care cert/re-cert  Rationale for Evaluation and Treatment: Rehabilitation  SUBJECTIVE:   SUBJECTIVE STATEMENT: Pt reports continue difficult Pt accompanied by: self  PERTINENT HISTORY: parkinson's diagnosed greater than 10 yrs ago, deep brain stimulator R, had in L as well but had to be removed for various reasons. Depression, sleep apnea, hx of back surgery, see hx above walks around neighborhood 3 x week with aide Tuesdays and Thursdays parkisons bicycle class 2 x Drue training for upper body work out   PRECAUTIONS: Fall- PT is addressing  WEIGHT BEARING RESTRICTIONS: No  PAIN:  Are you having pain? Yes: NPRS scale: 4/10,  Pain location: back, hip Pain description: aching Aggravating factors: malposiitoning Relieving factors: rest,   FALLS: Has patient fallen in last 6 months? No  LIVING  ENVIRONMENT: Lives with: lives with their spouse, has CNA 3x weeks Lives in: House/apartment Stairs: Yes: Internal: has stairt lift, and coverted garage appt on ground level Has following equipment at home: U-step  PLOF: Needs assistance with ADLs and Needs assistance with homemaking  PATIENT GOALS: Pt wants to be able to tie shoes, and more I with dressing  OBJECTIVE:  Note: Objective measures were completed at Evaluation unless otherwise noted.  HAND DOMINANCE: Right  ADLs: Overall ADLs: increased time required  Transfers/ambulation related to ADLs: Eating: difficulty cutting food, needs assist  Grooming: mod I with brushing teeth, combing, CNA assists with shaving UB Dressing: mod I LB Dressing: mod-max A for LB dressing Toileting: mod I with U-step Bathing: supervision-walk in shower, level has built in seat and grab bars Tub Shower transfers: min A Equipment: Grab bars and level walk in shower with built in bench  IADLs:Dependent with home management  Medication management: wife assists Financial management: wife handles Handwriting: Moderate micrographia at sentence level  MOBILITY STATUS: mod I with U-step  POSTURE COMMENTS:  rounded shoulders and forward head   FUNCTIONAL OUTCOME MEASURES: Fastening/unfastening 3 buttons: 1 min 43 secs   COORDINATION:  9 Hole Peg test: Right: 55.08 sec; Left: 49.17 sec Box and Blocks:  Right 26blocks, Left 34blocks  UE ROM:  A/ROM:shoulder flexion RUE-100 LUE105, elbow extension RUE-35 LUE -25, decreased bilateral supination, RUE 95% finger extension, maintains fingers flexed    SENSATION: WFL    COGNITION: Overall cognitive status: Impaired, slowed processing and short term memory deficits  OBSERVATIONS: Bradykinesia and resting tremor RUE, dystonia RUE                                                                                                                    TREATMENT DATE:11/03/23- reveiwed deep breathing  strategies, min-mod v.c and demonstration  PWR! up in seated x 10 reps  modified quadraped rock at [employer], then [employer]! rock in standing, at countertop, mod v.c and demonstration.  PWR! hands for PWR! up, twist Therapist checked progress towards goals and discussed plans for renewal with pt/ wife. see goals below for progress.  Box/  blocks R 31, L 34 9 hole peg test: R 4 pegs in 2 mins, L 50.90 secs PPT#2 :16.15 secs unable to write name legibily  10/25/23- PWR! up in seated x 10 reps   modified quadraped rock at [employer], then [employer]! rock and step in standing, at countertop, mod v.c and demonstration. Pt's wife observed. Education regarding deep breathing exercises, mod v.c Strategies for self feeding with practice and use of foam grps with min v.c. Foam grips were issued for use at home. discussed importance of opening hand in full extension for grasp/ relase of a drinking cup. Therapsit recommends acup with a lid if pt in concerned about splls. Pt practiced A/ROM ulnar/ radial deviation at right wrist for increased ease with drinking. Discussed strategies for freezing and importance of warm up prior to going to mailbox.    10/19/23 UBE x 5 mins level 1 for conditioning PWR! up x 10 reps in seated for warm up , min v.c for amplitude Bag exercises for simulated ADLS in seated: cumpling bag to simulate donning socks, reach overhead imulating donning shirt and then behind back pass to simulate  tucking in shirt min-mod v.c, mod difficulty with RUE functional use. PWR! hands, PWR! up, x 20 reps min v.c, standing at  countertop, functional reaching to place large pegs into pegboard with right ehn LUE, RUE fatigued quickly and was more challenging since botox . Pt took a rest break then he stood to remove large pegs with right and left UE's from vertical surface, mod difficulty and increased fatigue with RUE.   10/17/23- Pt practiced donning and doffing shorts over legs using reacher to assist mod  A and v.c. Pt was able to remove his shoes mod I however he required min A and mod v.c to donn. Pt presents with increased difficulty using RUE today s/p botox  injection. Pt reports increased weakness and difficulty using his RUE. Standing at countertop Modified quadruped PWR! rock, min facilitation/ v.c  then PWR! rock standing Dynamic functional reach with left and right UE's to rplace  and remove graded clothespins from targets 1-8#, lighter resistance used with RUE due to weakness. UBE x 5 mins level 1 for conditioning.    PATIENT EDUCATION: Education details:  see above Person educated: Patient,  Education method: Scientist, product/process development, v.c Education comprehension: verbalized understanding, returned demonstration, v.c  HOME EXERCISE PROGRAM: PWR! hands, beginning coordination-08/22/23  GOALS: Goals reviewed with patient? Yes  SHORT TERM GOALS: Target date: 12/03/23   I with PD specific HEP  Goal status:ongoing issued needs continued reinforcement 11/03/23  2.  Pt will demonstrate understanding of adapted strategies/ AE to increase safety and I with ADLs/IADLs. Goal status: ongoing therapist has initated, pt can benefit from continued reinforcement 11/03/23  3.  Pt will demonstrate improved bilateral UE use as evidenced by increasing box and blocks score by 3 blocks bilaterally.   Goal status: met-RUE 40 blocks,  LUE 39 blocks, goal 09/14/23 (RUE 31 blocks and LUE 34 blocks on 11/03/23)  4.  Pt will demonstrate improved bilateral UE functional use and ease with dressing as evidenced by decreasing 3 button/ unbutton test time by 5 secs.  Goal status: not met,  >3 mins, 10/03/23 goal deferred 11/03/23  5.  Pt will demonstrate ability to retrieve a lightweight object at 105 shoulder flexion and -30 elbow extension with RUE.  Goal status:  met, 125, -10 10/03/23  6.  Pt will demonstrate ability to retrieve a lightweight object at 110 shoulder flexion and -20 elbow extension with  LUE  Goal status:met, 125, -20 10/03/23  LONG TERM GOALS: Target date: 01/26/24- may d/c sooner   Pt will demonstrate improved fine motor coordination for ADLs as evidenced by decreasing 9 hole peg test score for RUE by 4 secs Revised goal: Pt will place 9 pegs in 2 mins or less.   Goal status:  ongoing-RUE 4 pegs placed in 2 mins 11/03/23  2.  Pt will demonstrate improved fine motor coordination for ADLs as evidenced by decreasing 9 hole peg test score for LUE by 4 secs  Goal status: ongoing, LUE 50.90 secs 11/03/23   3..  Pt will perfrom PPT#4 in 1 min15 secs or less without assist or LOB.  Baseline: 1 min 23 secs with min A- 09/14/23          Goal status;  ongoing 11/03/23   4.  Pt will verbalize understanding of ways to prevent future PD related complications  Goal status: ongoing, need reinforcement. 11/03/23  5.  Pt will write a short paragraph with 100% legibility and minimal decrease in letter size Revised goal: Pt will write his name legibly  Goal status: ongoing, increased difficulty with RUE use since botox , unable to write legibily 10/25/23  6. Pt will perform PPT#2 in 15 secs or less Baseline: 17.02 on inital eval  Goal status: ongoing-16.15 11/02/23   7. Check grip strength in RUE and set goal   ASSESSMENT:  CLINICAL IMPRESSION: Pt. made some progress towards goals. He achieved 3/6 short term goals. Pt's progress has been limited by severity of deficits and recent botox . He had botox  to RUE and now has increased difficulty using RUE functionally. Pt can benefit from skilled occupational therapy to address the performance deficts below and to improve RUE functional use in order to maximize pt's safety and I with ADLs/IADLs. Performance deficits:ADLs, IADLs, coordination, dexterity, tone, ROM, strength, flexibility, Fine motor control, Gross motor control, mobility, balance, endurance, decreased knowledge of precautions, decreased knowledge of use of DME, vision, and UE  functional use, cognitive skills  including attention, memory, problem solving, safety awareness, sequencing, and thought, and psychosocial skills including coping strategies, environmental adaptation, habits, interpersonal interactions, and routines and behaviors.   IMPAIRMENTS: are limiting patient from ADLs, IADLs, rest and sleep, play, leisure, and social participation.   COMORBIDITIES:  may have co-morbidities  that affects occupational performance. Patient will benefit from skilled OT to address above impairments and improve overall function.  MODIFICATION OR ASSISTANCE TO COMPLETE EVALUATION: Min-Moderate modification of tasks or assist with assess necessary to complete an evaluation.  OT OCCUPATIONAL PROFILE AND HISTORY: Detailed assessment: Review of records and additional review of physical, cognitive, psychosocial history related to current functional performance.  CLINICAL DECISION MAKING: Moderate - several treatment options, min-mod task modification necessary  REHAB POTENTIAL: Good  EVALUATION COMPLEXITY: Low    PLAN:  OT FREQUENCY: 2x/week  OT DURATION: 12 weeks  PLANNED INTERVENTIONS: 97168 OT Re-evaluation, 97535 self care/ADL training, 02889 therapeutic exercise, 97530 therapeutic activity, 97112 neuromuscular re-education, 97140 manual therapy, 97116 gait training, 02886 aquatic therapy, 97035 ultrasound, 97018 paraffin, 02989 moist heat, 97010 cryotherapy, 97034 contrast bath, 97129 Cognitive training (first 15 min), 02869 Cognitive training(each additional 15 min), passive range of motion, balance training, functional mobility training, visual/perceptual remediation/compensation, energy conservation, coping strategies training, patient/family education, and DME and/or AE instructions  RECOMMENDED OTHER SERVICES: n/a  CONSULTED AND AGREED WITH PLAN OF CARE: Patient  PLAN FOR NEXT SESSION: continue to work toward updated goals Gilmore List, OT 11/03/2023, 3:06 PM

## 2023-11-03 NOTE — Therapy (Signed)
 OUTPATIENT PHYSICAL THERAPY NEURO TREATMENT    Patient Name: Patrick Ross MRN: [id number] DOB:05-02-1945, 78 y.o., male Today's Date: 11/03/2023   PCP: Millicent Sharper MD REFERRING PROVIDER: Evonnie Asberry RAMAN DO  END OF SESSION:  PT End of Session - 11/03/23 1423     Visit Number 23    Date for PT Re-Evaluation 12/03/23    Authorization Type Medicare    PT Start Time 1444    PT Stop Time 1529    PT Time Calculation (min) 45 min    Activity Tolerance Patient tolerated treatment well    Behavior During Therapy WFL for tasks assessed/performed                      Past Medical History:  Diagnosis Date   Depression    DVT (deep venous thrombosis) (HCC)    GERD (gastroesophageal reflux disease)    History of kidney stones    passed   Hypertension    Hypothyroidism    Lumbar stenosis    OSA (obstructive sleep apnea)    wears cpap   Parkinson's disease (HCC)    PONV (postoperative nausea and vomiting)    REM behavioral disorder    Urinary incontinence    Past Surgical History:  Procedure Laterality Date   BACK SURGERY     x 2 - not fusion   BIOPSY  06/01/2022   Procedure: BIOPSY;  Surgeon: Dianna Specking, MD;  Location: [facility];  Service: Gastroenterology;;   COLONOSCOPY     DEEP BRAIN STIMULATOR PLACEMENT     left 12/27/2012, right 12/10/2011, left revision 02/02/2013, replacement bilateral 11/21/2015   ESOPHAGOGASTRODUODENOSCOPY (EGD) WITH PROPOFOL  N/A 06/01/2022   Procedure: ESOPHAGOGASTRODUODENOSCOPY (EGD) WITH PROPOFOL ;  Surgeon: Dianna Specking, MD;  Location: [facility];  Service: Gastroenterology;  Laterality: N/A;   MINOR PLACEMENT OF FIDUCIAL N/A 01/09/2019   Procedure: Fiducial placement;  Surgeon: Unice Pac, MD;  Location: [facility] OR;  Service: Neurosurgery;  Laterality: N/A;  Fiducial placement   PARTIAL NEPHRECTOMY Right 2005 ish   PULSE GENERATOR IMPLANT Right 01/23/2019   Procedure: Right chest implantable pulse generator change;   Surgeon: Unice Pac, MD;  Location: [facility] OR;  Service: Neurosurgery;  Laterality: Right;   SPINAL FUSION     lumbar Fusion x 2   SUBTHALAMIC STIMULATOR BATTERY REPLACEMENT Left 02/02/2018   Procedure: Change implantable pulse generator battery, Left chest;  Surgeon: Unice Pac, MD;  Location: [facility] OR;  Service: Neurosurgery;  Laterality: Left;   SUBTHALAMIC STIMULATOR BATTERY REPLACEMENT N/A 01/22/2020   Procedure: REMOVAL OF LEFT BRAIN DEEP BRAIN STIMULATOR ELECTRODE, EXTENSION, AND PULSE GENERATOR;  Surgeon: Unice Pac, MD;  Location: [facility] OR;  Service: Neurosurgery;  Laterality: N/A;   SUBTHALAMIC STIMULATOR BATTERY REPLACEMENT Right 02/23/2022   Procedure: CHANGE IPG BATTERY, RT CHEST;  Surgeon: Dawley, Lani JAYSON, DO;  Location: [facility] OR;  Service: Neurosurgery;  Laterality: Right;   SUBTHALAMIC STIMULATOR INSERTION Left 01/16/2019   Procedure: Left deep brain stimulator electrode revision.;  Surgeon: Unice Pac, MD;  Location: [facility] OR;  Service: Neurosurgery;  Laterality: Left;   SUBTHALAMIC STIMULATOR INSERTION Left 01/23/2019   Procedure: Repositioning of Left Deep Brain Stimulator Electrode;  Surgeon: Unice Pac, MD;  Location: [facility] OR;  Service: Neurosurgery;  Laterality: Left;   TONSILLECTOMY     Patient Active Problem List   Diagnosis Date Noted   Melena 06/01/2022   Acute cystitis without hematuria 05/31/2022   Upper GI bleed 05/31/2022   Generalized weakness 05/30/2022  Abdominal pain 05/30/2022   OSA (obstructive sleep apnea) 05/30/2022   Hypothyroidism 05/30/2022   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) 12/14/2021   Mixed hyperlipidemia 12/14/2021   Essential hypertension 12/14/2021   Palpitations 12/14/2021   Angina pectoris (HCC) 12/14/2021   Urge incontinence 01/15/2021   History of UTI 01/15/2021   Abnormal urine findings 01/15/2021   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   History of nephrolithiasis 01/15/2021   DVT, lower extremity, proximal, acute,  right (HCC) 02/26/2020   Open scalp wound 01/22/2020   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HCC)    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   Parkinson's disease (HCC) 01/16/2018   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) 01/16/2018   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    ONSET DATE: exacerbation 06/21/23, Parkinson's diagnosis 2010  REFERRING DIAG: parkinsons disease with fluctuating dyskinesia  THERAPY DIAG:  Other lack of coordination  Other abnormalities of gait and mobility  Parkinson's disease with dyskinesia, with fluctuations (HCC)  Difficulty in walking, not elsewhere classified  Rationale for Evaluation and Treatment: Rehabilitation  SUBJECTIVE:                                                                                                                                                                                             SUBJECTIVE STATEMENT:  Wife reports that she feels there is some increased cognitive decline, reports that he has difficulty with putting shoes and socks on, has some reports of dizziness when walking and then he stops, feels like he is still moving   EVAL-Pt and his wife report long history of parkinson's,  biggest difficulty with his mobility that they note is R lower back  pain and labored supine to sit movement, also poor foot clearance.  He doesn't like relying on the walker Pt accompanied by: significant other  PERTINENT HISTORY: parkinson's diagnosed greater than 10 yrs ago, deep brain stimulator R, had in L as well but had to be removed for various reasons.  walks around neighborhood 3 x week with aide Tuesdays and Thursdays parkisons bicycle class 2 x Drue training for upper body work out Referred to PT and ST by neurologist   PAIN:  Are you having pain? R lower back pain and hip  8/10, constant , getting OOB in the morning makes it worse, nothing makes it better  PRECAUTIONS: Fall and Other: deep brain stimulator on R   RED  FLAGS: None   WEIGHT BEARING RESTRICTIONS: No  FALLS: Has patient fallen in last 6 months? No  LIVING ENVIRONMENT: Lives with: lives with their spouse Lives in: House/apartment Stairs: has internal steps, but has had home renovated and  adapted so the he and his wife live in downstairs apartment, all one level with bathroom and kitchen Has following equipment at home: Single point cane, Walker - 4 wheeled, Shower bench, Grab bars, and Ramped entry  PLOF: Independent  PATIENT GOALS: improve bed mobility and improve gait, stability, avoid falls  OBJECTIVE:  Note: Objective measures were completed at Evaluation unless otherwise noted.  DIAGNOSTIC FINDINGS: see neuro notes, has deep brain stimulator R   COGNITION: Overall cognitive status: Within functional limits for tasks assessed   SENSATION: WFL  COORDINATION: Slowed movements B and noted curling R hand and R foot at times, with gait festinating on turns   EDEMA:  None noted  MUSCLE TONE: noted curling R fingers, toes, knee flex and hip flex at times, randomly during evaluation  MUSCLE LENGTH: Hamstrings: Right -30 deg; Left -30 deg Thomas test: Right nt deg; Left nt deg    POSTURE: Standing with some forward lean, weight on forefeet, L calf atrophied pt and wife report chronic after L lumbar surgery.     LOWER EXTREMITY ROM:   grossly wfl, hips not assessed as not positioned on mat today   LOWER EXTREMITY MMT:    MMT Right Eval Left Eval  Hip flexion 4 4-  Hip extension    Hip abduction    Hip adduction    Hip internal rotation    Hip external rotation    Knee flexion 4 4  Knee extension 4 4-  Ankle dorsiflexion 4 3+  Ankle plantarflexion 4- 3-  Ankle inversion    Ankle eversion    (Blank rows = not tested)  BED MOBILITY:  Not tested  TRANSFERS: I but slowed, uses hands on seat or with rollator  GAIT: Findings: Gait Characteristics: step to pattern, decreased step length- Right, decreased  step length- Left, decreased stride length, decreased hip/knee flexion- Right, decreased hip/knee flexion- Left, shuffling, festinating, trunk flexed, and narrow BOS, Distance walked: 100', Assistive device utilized:Walker - 4 wheeled, Level of assistance: SBA, and Comments: festinating noted on turns, tends to turn L  FUNCTIONAL TESTS:  30 seconds chair stand test 6 reps Timed up and go (TUG): 33.39 with rollator Standing static feet together 15 sec Standing semi tandem 10 sec each leg, needed CGA with L foot forward Standing marching, required B hand hold support to maintain balance                                                                                                                              TREATMENT DATE: 11/03/23 Gait with rollator outside with CGA, negotiated curbs a few times, at times shuffles significantly TUG noted below STS as noted below HHA walking 100 feet Direction changes Supine feet on ball K2C, rotation, bridge and abs Bed mobility to and from supine to sitting very difficult for him due to right low back pain   10/27/23  Seated PWR up with 2# Wate bar- reaching forward to  tap bolster then PWR up x8 Seated PWR reach x6 B  PWR step + twist x8 B STS from high mat table + yellow ball chest press x10   Nustep L5x8 minutes BLEs only  Walking forward and backward with U-walker, cues for big steps   10/19/23 STS with chest press 3# WaTE 2x10 Punches with 4# standing Modified sit up with yellow ball 2x10  NuStep L5 x59mins  Leg ext 10# 2x10 HS curls 25# 2x10 Ball catch Walking 2 laps- cues to take big steps    10/17/23 Recheck goals for PN Standing reaching yellow weighted ball at 4 targets on wall- had to sit after 3 reps due to pt feeling weak esp in his R arm Standing march with walker  NuStep L5 x6mins- mostly legs  LAQ 5# ankle weights      PATIENT EDUCATION: Education details: POC, goals Person educated: Patient and Spouse Education  method: Explanation, Demonstration, Tactile cues, and Verbal cues Education comprehension: verbalized understanding, verbal cues required, and needs further education  HOME EXERCISE PROGRAM: Access Code: [email or web address] URL: [email or web address] Date: 08/02/2023 Prepared by: Almetta Fam  Exercises - Supine Hamstring Stretch with Strap  - 2 x daily - 7 x weekly - 2 reps - 15 hold - Modified Thomas Stretch  - 2 x daily - 7 x weekly - 2 reps - 15 hold - Supine Bridge  - 1 x daily - 7 x weekly - 2 sets - 10 reps - Standing Shoulder Horizontal Abduction with Resistance  - 1 x daily - 7 x weekly - 2 sets - 10 reps  GOALS: Goals reviewed with patient? Yes  SHORT TERM GOALS: Target date: 4 weeks, 08/03/23  I home program for posture and for hamstring, hip flexor stretchig Baseline: Goal status: ongoing 08/02/23, ongoing 08/23/23, MET 10/12/23  LONG TERM GOALS: Target date: 10/31/23  30 sec sit to stand improve from 6 reps to 12 reps for improved coordination, strength, endurance LEs Baseline:  Goal status: 7 reps ongoing 08/04/23, 8 reps 08/23/23, 11 reps 09/26/23, 10 reps 10/17/23, 9 reps 11/03/23  2.  I and pain free supine to sit motion , rolling to R Baseline: very painful per pt and wife history, not specifically assessed in clinic today Goal status: ongoing 07/28/23, ongoing 08/09/23, still has pain with bed mobility 08/23/23, can do but still painful 09/26/23, does not hurt as bad 10/12/23, still hurting with this 11/03/23  3.  TUG score improve from 33 sec to 18 or less for reduced fall risk   Baseline:  Goal status: 27s ongoing 08/04/23, 22s 08/23/23, 20s 09/26/23, 20s 10/17/23, 11/03/23 20 seconds, difficulty with the turns  4.  Improve strength LE's for B quads, hamstrings to 4+5 for improved functional strength Baseline: 4- Goal status: 4/5 08/04/23, 5/5 MET 08/23/23   ASSESSMENT:  CLINICAL IMPRESSION:  Patient overall has improved, he is still at times having ups and downs, today seemed  to have a little more festenation with turns and with backing up.  We talked about this and used some BIG strategies to help with this.  He remains a fall risk due to this, walking outside he had some issues with negotiating the curb safely, needed CGA with this and once needed mod A to get walker down the step.  Pain limits his ability to get up and down from supine   EVAL Patient is a 78 y.o. male who was evaluated today by physical therapy due to referral from his neurologist, with decline  in function, particularly control of R extremities, with his Parkinson's diagnosis.  He and his wife have been proactive about maintaining his function, he attends a training/ strengthening session locally 2 x week , attends parkinson's cycling weekly, and walks around 0.6 miles with his aide 3 x week outdoors with his rollator.  They recently have adapted their home with a main floor renovation so that their bedroom, bath, kitchen are all on one level.   Main problems identified today are weakness B LE's, L ankle is weak chronically due to lumbar radiculopathy and B knee, hips musculature is weak.  He also has coordination deficits with festinating gait particularly on turns and static / dynamic balance deficits.  Has chronic R sided lower back pain which is particularly intense with moving supine to sit.  He should benefit from physical therapy intervention to address his deficits and update his current routine for stretching and strengthening at home.    OBJECTIVE IMPAIRMENTS: Abnormal gait, decreased activity tolerance, decreased balance, decreased coordination, decreased endurance, decreased mobility, difficulty walking, decreased strength, hypomobility, impaired perceived functional ability, impaired UE functional use, postural dysfunction, and pain.   ACTIVITY LIMITATIONS: carrying, lifting, squatting, stairs, transfers, and locomotion level  PARTICIPATION LIMITATIONS: meal prep, cleaning, laundry, shopping,  community activity, and church  PERSONAL FACTORS: Age, Behavior pattern, Fitness, Past/current experiences, Time since onset of injury/illness/exacerbation, and 1-2 comorbidities: h/o L lumbar radiculopathy and weakness L LE, chronic R sided lower back pain, are also affecting patient's functional outcome.   REHAB POTENTIAL: Good  CLINICAL DECISION MAKING: Evolving/moderate complexity  EVALUATION COMPLEXITY: Moderate  PLAN:  PT FREQUENCY: 2x/week  PT DURATION: 12 weeks  PLANNED INTERVENTIONS: 97110-Therapeutic exercises, 97530- Therapeutic activity, [id number]- Neuromuscular re-education, 97535- Self Care, and [id number]- Manual therapy  PLAN FOR NEXT SESSION: continue with balance, stretching, coordination activities, PWR based activities , renewal performed   Ozell Mainland, PT 11/03/23 2:24 PM

## 2023-11-09 ENCOUNTER — Encounter: Payer: Self-pay | Admitting: Occupational Therapy

## 2023-11-09 ENCOUNTER — Ambulatory Visit: Admitting: Occupational Therapy

## 2023-11-09 DIAGNOSIS — R29818 Other symptoms and signs involving the nervous system: Secondary | ICD-10-CM

## 2023-11-09 DIAGNOSIS — R41844 Frontal lobe and executive function deficit: Secondary | ICD-10-CM

## 2023-11-09 DIAGNOSIS — R278 Other lack of coordination: Secondary | ICD-10-CM

## 2023-11-09 DIAGNOSIS — R2689 Other abnormalities of gait and mobility: Secondary | ICD-10-CM

## 2023-11-09 DIAGNOSIS — R41840 Attention and concentration deficit: Secondary | ICD-10-CM

## 2023-11-09 NOTE — Patient Instructions (Addendum)
 PWR! Hands  With arms stretched out in front of you (elbows straight), perform the following: PWR! Rock: Move wrists up and down [place]! Twist: Twist palms up and down BIG  Then, start with elbows bent and hands closed. PWR! Step: Touch index finger to thumb while keeping other fingers straight. Flick fingers out BIG (thumb out/straighten fingers). Repeat with other fingers. (Step your thumb to each finger). PWR! Hands: Push hands out BIG. Elbows straight, wrists up, fingers open and spread apart BIG. (Can also perform by pushing down on table, chair, knees. Push above head, out to the side, behind you, in front of you.)    ** Make each movement big and deliberate so that you feel the movement.  Perform at least 10 repetitions 1x/day, but perform PWR! hands throughout the day when you are having trouble using your hands (picking up/manipulating small objects, writing, eating, typing, sewing, buttoning, etc.).     Coordination Exercises  Perform the following exercises for 10 minutes 3 times per week  Perform with both hand(s). Perform using big movements.  Flipping Cards: Place deck of cards on the table. Flip cards over by opening your hand big to grasp and then turn your palm up big. Deal cards: Hold 1/2 or whole deck in your hand. Use thumb to push card off top of deck with one big push.  Pick up coins and place in coin bank or container: Pick up with big, intentional movements. Do not drag coin to the edge. checkers with R hand Pick up coins and stack one at a time: Pick up with big, intentional movements. Do not drag coin to the edge. (5-10 in a stack) Pick up 5-10 coins one at a time and hold in palm. Then, move coins from palm to fingertips one at time and place in coin bank/container. Practice writing: Slow down, write big, and focus on forming each letter. Perform Flicks/hand stretches (PWR! Hands): Close hands then flick out your fingers with focus on opening hands, pulling  wrists back, and extending elbows like you are pushing. -Scrunch a washcloth or plastic bag with left and right hands then flatten

## 2023-11-10 NOTE — Therapy (Signed)
 OUTPATIENT OCCUPATIONAL THERAPY [facility] treatment  Patient Name: Patrick Ross MRN: [id number] DOB:Jan 05, 1946, 78 y.o., male Today's Date: 11/10/2023  PCP: Dr. Millicent REFERRING PROVIDER:Dr. Tat  END OF SESSION:  OT End of Session - 11/09/23 1413     Visit Number 15    Number of Visits 25    Date for OT Re-Evaluation 01/26/24    Authorization Type MCR, [employer]    Authorization Time Period 12 weeks, anticipate d/c after 8 weeks    Authorization - Visit Number 16    Progress Note Due on Visit 20    OT Start Time 1404    OT Stop Time 1444    OT Time Calculation (min) 40 min    Activity Tolerance Patient tolerated treatment well    Behavior During Therapy WFL for tasks assessed/performed                  Past Medical History:  Diagnosis Date   Depression    DVT (deep venous thrombosis) (HCC)    GERD (gastroesophageal reflux disease)    History of kidney stones    passed   Hypertension    Hypothyroidism    Lumbar stenosis    OSA (obstructive sleep apnea)    wears cpap   Parkinson's disease (HCC)    PONV (postoperative nausea and vomiting)    REM behavioral disorder    Urinary incontinence    Past Surgical History:  Procedure Laterality Date   BACK SURGERY     x 2 - not fusion   BIOPSY  06/01/2022   Procedure: BIOPSY;  Surgeon: Dianna Specking, MD;  Location: [facility];  Service: Gastroenterology;;   COLONOSCOPY     DEEP BRAIN STIMULATOR PLACEMENT     left 12/27/2012, right 12/10/2011, left revision 02/02/2013, replacement bilateral 11/21/2015   ESOPHAGOGASTRODUODENOSCOPY (EGD) WITH PROPOFOL  N/A 06/01/2022   Procedure: ESOPHAGOGASTRODUODENOSCOPY (EGD) WITH PROPOFOL ;  Surgeon: Dianna Specking, MD;  Location: [facility];  Service: Gastroenterology;  Laterality: N/A;   MINOR PLACEMENT OF FIDUCIAL N/A 01/09/2019   Procedure: Fiducial placement;  Surgeon: Unice Pac, MD;  Location: [facility] OR;  Service: Neurosurgery;  Laterality: N/A;  Fiducial placement    PARTIAL NEPHRECTOMY Right 2005 ish   PULSE GENERATOR IMPLANT Right 01/23/2019   Procedure: Right chest implantable pulse generator change;  Surgeon: Unice Pac, MD;  Location: [facility] OR;  Service: Neurosurgery;  Laterality: Right;   SPINAL FUSION     lumbar Fusion x 2   SUBTHALAMIC STIMULATOR BATTERY REPLACEMENT Left 02/02/2018   Procedure: Change implantable pulse generator battery, Left chest;  Surgeon: Unice Pac, MD;  Location: [facility] OR;  Service: Neurosurgery;  Laterality: Left;   SUBTHALAMIC STIMULATOR BATTERY REPLACEMENT N/A 01/22/2020   Procedure: REMOVAL OF LEFT BRAIN DEEP BRAIN STIMULATOR ELECTRODE, EXTENSION, AND PULSE GENERATOR;  Surgeon: Unice Pac, MD;  Location: [facility] OR;  Service: Neurosurgery;  Laterality: N/A;   SUBTHALAMIC STIMULATOR BATTERY REPLACEMENT Right 02/23/2022   Procedure: CHANGE IPG BATTERY, RT CHEST;  Surgeon: Dawley, Lani JAYSON, DO;  Location: [facility] OR;  Service: Neurosurgery;  Laterality: Right;   SUBTHALAMIC STIMULATOR INSERTION Left 01/16/2019   Procedure: Left deep brain stimulator electrode revision.;  Surgeon: Unice Pac, MD;  Location: [facility] OR;  Service: Neurosurgery;  Laterality: Left;   SUBTHALAMIC STIMULATOR INSERTION Left 01/23/2019   Procedure: Repositioning of Left Deep Brain Stimulator Electrode;  Surgeon: Unice Pac, MD;  Location: [facility] OR;  Service: Neurosurgery;  Laterality: Left;   TONSILLECTOMY     Patient Active Problem List  Diagnosis Date Noted   Melena 06/01/2022   Acute cystitis without hematuria 05/31/2022   Upper GI bleed 05/31/2022   Generalized weakness 05/30/2022   Abdominal pain 05/30/2022   OSA (obstructive sleep apnea) 05/30/2022   Hypothyroidism 05/30/2022   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) 12/14/2021   Mixed hyperlipidemia 12/14/2021   Essential hypertension 12/14/2021   Palpitations 12/14/2021   Angina pectoris (HCC) 12/14/2021   Urge incontinence 01/15/2021   History of UTI 01/15/2021   Abnormal  urine findings 01/15/2021   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   History of nephrolithiasis 01/15/2021   DVT, lower extremity, proximal, acute, right (HCC) 02/26/2020   Open scalp wound 01/22/2020   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HCC)    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   Parkinson's disease (HCC) 01/16/2018   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) 01/16/2018   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    ONSET DATE: 07/28/23- referral date  REFERRING DIAG:  Diagnosis  Z74.1 (ICD-10-CM) - Requires assistance with activities of daily living (ADL)  G20.B2 (ICD-10-CM) - Parkinson's disease with dyskinesia and fluctuating manifestations (HCC)    THERAPY DIAG:  Other lack of coordination  Other symptoms and signs involving the nervous system  Other abnormalities of gait and mobility  Attention and concentration deficit  Frontal lobe and executive function deficit  Rationale for Evaluation and Treatment: Rehabilitation  SUBJECTIVE:   SUBJECTIVE STATEMENT: Pt reports continue difficult Pt accompanied by: self  PERTINENT HISTORY: parkinson's diagnosed greater than 10 yrs ago, deep brain stimulator R, had in L as well but had to be removed for various reasons. Depression, sleep apnea, hx of back surgery, see hx above walks around neighborhood 3 x week with aide Tuesdays and Thursdays parkisons bicycle class 2 x Drue training for upper body work out   PRECAUTIONS: Fall- PT is addressing  WEIGHT BEARING RESTRICTIONS: No  PAIN:  Are you having pain? Yes: NPRS scale: 4/10,  Pain location: back, hip Pain description: aching Aggravating factors: malposiitoning Relieving factors: rest,   FALLS: Has patient fallen in last 6 months? No  LIVING ENVIRONMENT: Lives with: lives with their spouse, has CNA 3x weeks Lives in: House/apartment Stairs: Yes: Internal: has stairt lift, and coverted garage appt on ground level Has following equipment at home: U-step  PLOF: Needs assistance with  ADLs and Needs assistance with homemaking  PATIENT GOALS: Pt wants to be able to tie shoes, and more I with dressing  OBJECTIVE:  Note: Objective measures were completed at Evaluation unless otherwise noted.  HAND DOMINANCE: Right  ADLs: Overall ADLs: increased time required  Transfers/ambulation related to ADLs: Eating: difficulty cutting food, needs assist  Grooming: mod I with brushing teeth, combing, CNA assists with shaving UB Dressing: mod I LB Dressing: mod-max A for LB dressing Toileting: mod I with U-step Bathing: supervision-walk in shower, level has built in seat and grab bars Tub Shower transfers: min A Equipment: Grab bars and level walk in shower with built in bench  IADLs:Dependent with home management  Medication management: wife assists Financial management: wife handles Handwriting: Moderate micrographia at sentence level  MOBILITY STATUS: mod I with U-step  POSTURE COMMENTS:  rounded shoulders and forward head   FUNCTIONAL OUTCOME MEASURES: Fastening/unfastening 3 buttons: 1 min 43 secs   COORDINATION: 9 Hole Peg test: Right: 55.08 sec; Left: 49.17 sec Box and Blocks:  Right 26blocks, Left 34blocks  UE ROM:  A/ROM:shoulder flexion RUE-100 LUE105, elbow extension RUE-35 LUE -25, decreased bilateral supination, RUE 95% finger extension, maintains fingers flexed  SENSATION: WFL    COGNITION: Overall cognitive status: Impaired, slowed processing and short term memory deficits  OBSERVATIONS: Bradykinesia and resting tremor RUE, dystonia RUE                                                                                                                    TREATMENT DATE:11/09/23-modified quadraped rock at [employer], then [employer]! rock in standing, at countertop, mod v.c and demonstration. Reviewed PWR hands and coordination HEP and re-ssued so pt can practice with his hired caregiver.  11/03/23- reveiwed deep breathing strategies, min-mod v.c and  demonstration  PWR! up in seated x 10 reps  modified quadraped rock at [employer], then [employer]! rock in standing, at countertop, mod v.c and demonstration.  PWR! hands for PWR! up, twist Therapist checked progress towards goals and discussed plans for renewal with pt/ wife. see goals below for progress.  Box/  blocks R 31, L 34 9 hole peg test: R 4 pegs in 2 mins, L 50.90 secs PPT#2 :16.15 secs unable to write name legibily  10/25/23- PWR! up in seated x 10 reps   modified quadraped rock at [employer], then [employer]! rock and step in standing, at countertop, mod v.c and demonstration. Pt's wife observed. Education regarding deep breathing exercises, mod v.c Strategies for self feeding with practice and use of foam grps with min v.c. Foam grips were issued for use at home. discussed importance of opening hand in full extension for grasp/ relase of a drinking cup. Therapsit recommends acup with a lid if pt in concerned about splls. Pt practiced A/ROM ulnar/ radial deviation at right wrist for increased ease with drinking. Discussed strategies for freezing and importance of warm up prior to going to mailbox.    10/19/23 UBE x 5 mins level 1 for conditioning PWR! up x 10 reps in seated for warm up , min v.c for amplitude Bag exercises for simulated ADLS in seated: cumpling bag to simulate donning socks, reach overhead imulating donning shirt and then behind back pass to simulate  tucking in shirt min-mod v.c, mod difficulty with RUE functional use. PWR! hands, PWR! up, x 20 reps min v.c, standing at  countertop, functional reaching to place large pegs into pegboard with right ehn LUE, RUE fatigued quickly and was more challenging since botox . Pt took a rest break then he stood to remove large pegs with right and left UE's from vertical surface, mod difficulty and increased fatigue with RUE.   10/17/23- Pt practiced donning and doffing shorts over legs using reacher to assist mod A and v.c. Pt was able to  remove his shoes mod I however he required min A and mod v.c to donn. Pt presents with increased difficulty using RUE today s/p botox  injection. Pt reports increased weakness and difficulty using his RUE. Standing at countertop Modified quadruped PWR! rock, min facilitation/ v.c  then PWR! rock standing Dynamic functional reach with left and right UE's to rplace and remove graded clothespins from targets 1-8#, lighter resistance used with  RUE due to weakness. UBE x 5 mins level 1 for conditioning.    PATIENT EDUCATION: Education details:  PWR! hands coordination HEP Person educated: Patient,  Education method: Explanation demonstration, v.c, handout Education comprehension: verbalized understanding, returned demonstration, v.c  HOME EXERCISE PROGRAM: PWR! hands, beginning coordination-08/22/23  GOALS: Goals reviewed with patient? Yes  SHORT TERM GOALS: Target date: 12/03/23   I with PD specific HEP  Goal status: met 11/09/23  2.  Pt will demonstrate understanding of adapted strategies/ AE to increase safety and I with ADLs/IADLs. Goal status: ongoing therapist has initated, pt can benefit from continued reinforcement 11/03/23  3.  Pt will demonstrate improved bilateral UE use as evidenced by increasing box and blocks score by 3 blocks bilaterally.   Goal status: met-RUE 40 blocks,  LUE 39 blocks, goal 09/14/23 (RUE 31 blocks and LUE 34 blocks on 11/03/23)  4.  Pt will demonstrate improved bilateral UE functional use and ease with dressing as evidenced by decreasing 3 button/ unbutton test time by 5 secs.  Goal status: not met,  >3 mins, 10/03/23 goal deferred 11/03/23  5.  Pt will demonstrate ability to retrieve a lightweight object at 105 shoulder flexion and -30 elbow extension with RUE.  Goal status:  met, 125, -10 10/03/23  6.  Pt will demonstrate ability to retrieve a lightweight object at 110 shoulder flexion and -20 elbow extension with LUE  Goal status:met, 125, -20  10/03/23  LONG TERM GOALS: Target date: 01/26/24- may d/c sooner   Pt will demonstrate improved fine motor coordination for ADLs as evidenced by decreasing 9 hole peg test score for RUE by 4 secs Revised goal: Pt will place 9 pegs in 2 mins or less.   Goal status:  ongoing-RUE 4 pegs placed in 2 mins 11/03/23  2.  Pt will demonstrate improved fine motor coordination for ADLs as evidenced by decreasing 9 hole peg test score for LUE by 4 secs  Goal status: ongoing, LUE 50.90 secs 11/03/23   3..  Pt will perfrom PPT#4 in 1 min15 secs or less without assist or LOB.  Baseline: 1 min 23 secs with min A- 09/14/23          Goal status;  ongoing 11/03/23   4.  Pt will verbalize understanding of ways to prevent future PD related complications  Goal status: ongoing, need reinforcement. 11/03/23  5.  Pt will write a short paragraph with 100% legibility and minimal decrease in letter size Revised goal: Pt will write his name legibly  Goal status: ongoing, increased difficulty with RUE use since botox , unable to write legibily 10/25/23  6. Pt will perform PPT#2 in 15 secs or less Baseline: 17.02 on inital eval  Goal status: ongoing-16.15 11/02/23   7. Check grip strength in RUE and set goal   ASSESSMENT:  CLINICAL IMPRESSION: Pt. is progressing towards goals. He demosntrates under standing of coordination HEP and PWR! hnds following review. Performance deficits:ADLs, IADLs, coordination, dexterity, tone, ROM, strength, flexibility, Fine motor control, Gross motor control, mobility, balance, endurance, decreased knowledge of precautions, decreased knowledge of use of DME, vision, and UE functional use, cognitive skills including attention, memory, problem solving, safety awareness, sequencing, and thought, and psychosocial skills including coping strategies, environmental adaptation, habits, interpersonal interactions, and routines and behaviors.   IMPAIRMENTS: are limiting patient from ADLs, IADLs, rest  and sleep, play, leisure, and social participation.   COMORBIDITIES:  may have co-morbidities  that affects occupational performance. Patient will benefit from skilled OT  to address above impairments and improve overall function.  MODIFICATION OR ASSISTANCE TO COMPLETE EVALUATION: Min-Moderate modification of tasks or assist with assess necessary to complete an evaluation.  OT OCCUPATIONAL PROFILE AND HISTORY: Detailed assessment: Review of records and additional review of physical, cognitive, psychosocial history related to current functional performance.  CLINICAL DECISION MAKING: Moderate - several treatment options, min-mod task modification necessary  REHAB POTENTIAL: Good  EVALUATION COMPLEXITY: Low    PLAN:  OT FREQUENCY: 2x/week  OT DURATION: 12 weeks  PLANNED INTERVENTIONS: 97168 OT Re-evaluation, 97535 self care/ADL training, 02889 therapeutic exercise, 97530 therapeutic activity, 97112 neuromuscular re-education, 97140 manual therapy, 97116 gait training, 02886 aquatic therapy, 97035 ultrasound, 97018 paraffin, 02989 moist heat, 97010 cryotherapy, 97034 contrast bath, 97129 Cognitive training (first 15 min), 02869 Cognitive training(each additional 15 min), passive range of motion, balance training, functional mobility training, visual/perceptual remediation/compensation, energy conservation, coping strategies training, patient/family education, and DME and/or AE instructions  RECOMMENDED OTHER SERVICES: n/a  CONSULTED AND AGREED WITH PLAN OF CARE: Patient  PLAN FOR NEXT SESSION: continue to work toward updated goals Hilario Robarts, OT 11/10/2023, 2:44 PM

## 2023-11-14 ENCOUNTER — Ambulatory Visit: Admitting: Occupational Therapy

## 2023-11-14 DIAGNOSIS — R29818 Other symptoms and signs involving the nervous system: Secondary | ICD-10-CM

## 2023-11-14 DIAGNOSIS — R41840 Attention and concentration deficit: Secondary | ICD-10-CM

## 2023-11-14 DIAGNOSIS — R41844 Frontal lobe and executive function deficit: Secondary | ICD-10-CM

## 2023-11-14 DIAGNOSIS — R278 Other lack of coordination: Secondary | ICD-10-CM | POA: Diagnosis not present

## 2023-11-14 DIAGNOSIS — R2689 Other abnormalities of gait and mobility: Secondary | ICD-10-CM

## 2023-11-14 NOTE — Therapy (Signed)
 OUTPATIENT OCCUPATIONAL THERAPY PARKINSON'S treatment  Patient Name: Patrick Ross MRN: [id number] DOB:1946-01-10, 78 y.o., male Today's Date: 11/14/2023  PCP: Dr. Millicent REFERRING PROVIDER:Dr. Tat  END OF SESSION:            Past Medical History:  Diagnosis Date   Depression    DVT (deep venous thrombosis) (HCC)    GERD (gastroesophageal reflux disease)    History of kidney stones    passed   Hypertension    Hypothyroidism    Lumbar stenosis    OSA (obstructive sleep apnea)    wears cpap   Parkinson's disease (HCC)    PONV (postoperative nausea and vomiting)    REM behavioral disorder    Urinary incontinence    Past Surgical History:  Procedure Laterality Date   BACK SURGERY     x 2 - not fusion   BIOPSY  06/01/2022   Procedure: BIOPSY;  Surgeon: Dianna Specking, MD;  Location: [facility];  Service: Gastroenterology;;   COLONOSCOPY     DEEP BRAIN STIMULATOR PLACEMENT     left 12/27/2012, right 12/10/2011, left revision 02/02/2013, replacement bilateral 11/21/2015   ESOPHAGOGASTRODUODENOSCOPY (EGD) WITH PROPOFOL  N/A 06/01/2022   Procedure: ESOPHAGOGASTRODUODENOSCOPY (EGD) WITH PROPOFOL ;  Surgeon: Dianna Specking, MD;  Location: [facility];  Service: Gastroenterology;  Laterality: N/A;   MINOR PLACEMENT OF FIDUCIAL N/A 01/09/2019   Procedure: Fiducial placement;  Surgeon: Unice Pac, MD;  Location: [facility];  Service: Neurosurgery;  Laterality: N/A;  Fiducial placement   PARTIAL NEPHRECTOMY Right 2005 ish   PULSE GENERATOR IMPLANT Right 01/23/2019   Procedure: Right chest implantable pulse generator change;  Surgeon: Unice Pac, MD;  Location: [facility] OR;  Service: Neurosurgery;  Laterality: Right;   SPINAL FUSION     lumbar Fusion x 2   SUBTHALAMIC STIMULATOR BATTERY REPLACEMENT Left 02/02/2018   Procedure: Change implantable pulse generator battery, Left chest;  Surgeon: Unice Pac, MD;  Location: [facility] OR;  Service: Neurosurgery;  Laterality: Left;    SUBTHALAMIC STIMULATOR BATTERY REPLACEMENT N/A 01/22/2020   Procedure: REMOVAL OF LEFT BRAIN DEEP BRAIN STIMULATOR ELECTRODE, EXTENSION, AND PULSE GENERATOR;  Surgeon: Unice Pac, MD;  Location: [facility] OR;  Service: Neurosurgery;  Laterality: N/A;   SUBTHALAMIC STIMULATOR BATTERY REPLACEMENT Right 02/23/2022   Procedure: CHANGE IPG BATTERY, RT CHEST;  Surgeon: Dawley, Lani JAYSON, DO;  Location: [facility] OR;  Service: Neurosurgery;  Laterality: Right;   SUBTHALAMIC STIMULATOR INSERTION Left 01/16/2019   Procedure: Left deep brain stimulator electrode revision.;  Surgeon: Unice Pac, MD;  Location: [facility] OR;  Service: Neurosurgery;  Laterality: Left;   SUBTHALAMIC STIMULATOR INSERTION Left 01/23/2019   Procedure: Repositioning of Left Deep Brain Stimulator Electrode;  Surgeon: Unice Pac, MD;  Location: [facility] OR;  Service: Neurosurgery;  Laterality: Left;   TONSILLECTOMY     Patient Active Problem List   Diagnosis Date Noted   Melena 06/01/2022   Acute cystitis without hematuria 05/31/2022   Upper GI bleed 05/31/2022   Generalized weakness 05/30/2022   Abdominal pain 05/30/2022   OSA (obstructive sleep apnea) 05/30/2022   Hypothyroidism 05/30/2022   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) 12/14/2021   Mixed hyperlipidemia 12/14/2021   Essential hypertension 12/14/2021   Palpitations 12/14/2021   Angina pectoris (HCC) 12/14/2021   Urge incontinence 01/15/2021   History of UTI 01/15/2021   Abnormal urine findings 01/15/2021   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   History of nephrolithiasis 01/15/2021   DVT, lower extremity, proximal, acute, right (HCC) 02/26/2020   Open scalp wound 01/22/2020  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HCC)    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   Parkinson's disease (HCC) 01/16/2018   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) 01/16/2018   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    ONSET DATE: 07/28/23- referral date  REFERRING DIAG:  Diagnosis  Z74.1 (ICD-10-CM) -  Requires assistance with activities of daily living (ADL)  G20.B2 (ICD-10-CM) - Parkinson's disease with dyskinesia and fluctuating manifestations (HCC)    THERAPY DIAG:  Other lack of coordination  Other symptoms and signs involving the nervous system  Other abnormalities of gait and mobility  Attention and concentration deficit  Frontal lobe and executive function deficit  Rationale for Evaluation and Treatment: Rehabilitation  SUBJECTIVE:   SUBJECTIVE STATEMENT: Pt reports continue difficult Pt accompanied by: self  PERTINENT HISTORY: parkinson's diagnosed greater than 10 yrs ago, deep brain stimulator R, had in L as well but had to be removed for various reasons. Depression, sleep apnea, hx of back surgery, see hx above walks around neighborhood 3 x week with aide Tuesdays and Thursdays parkisons bicycle class 2 x Drue training for upper body work out   PRECAUTIONS: Fall- PT is addressing  WEIGHT BEARING RESTRICTIONS: No  PAIN:  Are you having pain? Yes: NPRS scale: 4/10,  Pain location: back, hip Pain description: aching Aggravating factors: malposiitoning Relieving factors: rest,   FALLS: Has patient fallen in last 6 months? No  LIVING ENVIRONMENT: Lives with: lives with their spouse, has CNA 3x weeks Lives in: House/apartment Stairs: Yes: Internal: has stairt lift, and coverted garage appt on ground level Has following equipment at home: U-step  PLOF: Needs assistance with ADLs and Needs assistance with homemaking  PATIENT GOALS: Pt wants to be able to tie shoes, and more I with dressing  OBJECTIVE:  Note: Objective measures were completed at Evaluation unless otherwise noted.  HAND DOMINANCE: Right  ADLs: Overall ADLs: increased time required  Transfers/ambulation related to ADLs: Eating: difficulty cutting food, needs assist  Grooming: mod I with brushing teeth, combing, CNA assists with shaving UB Dressing: mod I LB Dressing: mod-max A for LB  dressing Toileting: mod I with U-step Bathing: supervision-walk in shower, level has built in seat and grab bars Tub Shower transfers: min A Equipment: Grab bars and level walk in shower with built in bench  IADLs:Dependent with home management  Medication management: wife assists Financial management: wife handles Handwriting: Moderate micrographia at sentence level  MOBILITY STATUS: mod I with U-step  POSTURE COMMENTS:  rounded shoulders and forward head   FUNCTIONAL OUTCOME MEASURES: Fastening/unfastening 3 buttons: 1 min 43 secs   COORDINATION: 9 Hole Peg test: Right: 55.08 sec; Left: 49.17 sec Box and Blocks:  Right 26blocks, Left 34blocks  UE ROM:  A/ROM:shoulder flexion RUE-100 LUE105, elbow extension RUE-35 LUE -25, decreased bilateral supination, RUE 95% finger extension, maintains fingers flexed    SENSATION: WFL    COGNITION: Overall cognitive status: Impaired, slowed processing and short term memory deficits  OBSERVATIONS: Bradykinesia and resting tremor RUE, dystonia RUE  TREATMENT DATE:11/14/23-modified quadraped rock at [employer], then [employer]! rock in standing, at countertop, Dynamic step and reach to each side to flip playing cards min-mod v.c for amplitude Marching at countertop, min v.c PWR1 hands basic 4, min-mod v.c and demonstration. Handwriting activities using a variety of pens and grips. Pt trialed foam grip however he perfromed better with felt tip pen. mod v.c and demonstration for printing large. Pt was able to print his name legibily with practice using felt  tip pen and increased time.  11/09/23-modified quadraped rock at [employer], then [employer] rock in standing, at countertop, mod v.c and demonstration. Reviewed PWR hands and coordination HEP and re-ssued so pt can practice with his hired caregiver.  11/03/23- reveiwed deep breathing  strategies, min-mod v.c and demonstration  PWR! up in seated x 10 reps  modified quadraped rock at [employer], then [employer] rock in standing, at countertop, mod v.c and demonstration.  PWR! hands for PWR! up, twist Therapist checked progress towards goals and discussed plans for renewal with pt/ wife. see goals below for progress.  Box/  blocks R 31, L 34 9 hole peg test: R 4 pegs in 2 mins, L 50.90 secs PPT#2 :16.15 secs unable to write name legibily  10/25/23- PWR! up in seated x 10 reps   modified quadraped rock at [employer], then [employer]! rock and step in standing, at countertop, mod v.c and demonstration. Pt's wife observed. Education regarding deep breathing exercises, mod v.c Strategies for self feeding with practice and use of foam grps with min v.c. Foam grips were issued for use at home. discussed importance of opening hand in full extension for grasp/ relase of a drinking cup. Therapsit recommends acup with a lid if pt in concerned about splls. Pt practiced A/ROM ulnar/ radial deviation at right wrist for increased ease with drinking. Discussed strategies for freezing and importance of warm up prior to going to mailbox.    10/19/23 UBE x 5 mins level 1 for conditioning PWR! up x 10 reps in seated for warm up , min v.c for amplitude Bag exercises for simulated ADLS in seated: cumpling bag to simulate donning socks, reach overhead imulating donning shirt and then behind back pass to simulate  tucking in shirt min-mod v.c, mod difficulty with RUE functional use. PWR! hands, PWR! up, x 20 reps min v.c, standing at  countertop, functional reaching to place large pegs into pegboard with right ehn LUE, RUE fatigued quickly and was more challenging since botox . Pt took a rest break then he stood to remove large pegs with right and left UE's from vertical surface, mod difficulty and increased fatigue with RUE.   10/17/23- Pt practiced donning and doffing shorts over legs using reacher to assist mod  A and v.c. Pt was able to remove his shoes mod I however he required min A and mod v.c to donn. Pt presents with increased difficulty using RUE today s/p botox  injection. Pt reports increased weakness and difficulty using his RUE. Standing at countertop Modified quadruped PWR! rock, min facilitation/ v.c  then PWR! rock standing Dynamic functional reach with left and right UE's to rplace and remove graded clothespins from targets 1-8#, lighter resistance used with RUE due to weakness. UBE x 5 mins level 1 for conditioning.    PATIENT EDUCATION: Education details:   Adult nurse Person educated: Patient,  Education method: Explanation demonstration, v.c, handout Education comprehension: verbalized understanding, returned demonstration, v.c  HOME EXERCISE PROGRAM: PWR! hands, beginning coordination-08/22/23  GOALS: Goals reviewed with patient?  Yes  SHORT TERM GOALS: Target date: 12/03/23   I with PD specific HEP  Goal status: met 11/09/23  2.  Pt will demonstrate understanding of adapted strategies/ AE to increase safety and I with ADLs/IADLs. Goal status: ongoing therapist has initated, pt can benefit from continued reinforcement 11/03/23  3.  Pt will demonstrate improved bilateral UE use as evidenced by increasing box and blocks score by 3 blocks bilaterally.   Goal status: met-RUE 40 blocks,  LUE 39 blocks, goal 09/14/23 (RUE 31 blocks and LUE 34 blocks on 11/03/23)  4.  Pt will demonstrate improved bilateral UE functional use and ease with dressing as evidenced by decreasing 3 button/ unbutton test time by 5 secs.  Goal status: not met,  >3 mins, 10/03/23 goal deferred 11/03/23  5.  Pt will demonstrate ability to retrieve a lightweight object at 105 shoulder flexion and -30 elbow extension with RUE.  Goal status:  met, 125, -10 10/03/23  6.  Pt will demonstrate ability to retrieve a lightweight object at 110 shoulder flexion and -20 elbow extension with LUE  Goal status:met,  125, -20 10/03/23  LONG TERM GOALS: Target date: 01/26/24- may d/c sooner   Pt will demonstrate improved fine motor coordination for ADLs as evidenced by decreasing 9 hole peg test score for RUE by 4 secs Revised goal: Pt will place 9 pegs in 2 mins or less.   Goal status:  ongoing-RUE 4 pegs placed in 2 mins 11/03/23  2.  Pt will demonstrate improved fine motor coordination for ADLs as evidenced by decreasing 9 hole peg test score for LUE by 4 secs  Goal status: ongoing, LUE 50.90 secs 11/03/23   3..  Pt will perfrom PPT#4 in 1 min15 secs or less without assist or LOB.  Baseline: 1 min 23 secs with min A- 09/14/23          Goal status;  ongoing 11/03/23   4.  Pt will verbalize understanding of ways to prevent future PD related complications  Goal status: ongoing, need reinforcement. 11/03/23  5.  Pt will write a short paragraph with 100% legibility and minimal decrease in letter size Revised goal: Pt will write his name legibly  Goal status: ongoing, increased difficulty with RUE use since botox , unable to write legibily 10/25/23  6. Pt will perform PPT#2 in 15 secs or less Baseline: 17.02 on inital eval  Goal status: ongoing-16.15 11/02/23   7. Check grip strength in RUE and set goal   ASSESSMENT:  CLINICAL IMPRESSION: Pt. is progressing towards goals. He demosntrates understanding of handwriting strategies following practice and review. He may benefit from reinforcement. Performance deficits:ADLs, IADLs, coordination, dexterity, tone, ROM, strength, flexibility, Fine motor control, Gross motor control, mobility, balance, endurance, decreased knowledge of precautions, decreased knowledge of use of DME, vision, and UE functional use, cognitive skills including attention, memory, problem solving, safety awareness, sequencing, and thought, and psychosocial skills including coping strategies, environmental adaptation, habits, interpersonal interactions, and routines and behaviors.    IMPAIRMENTS: are limiting patient from ADLs, IADLs, rest and sleep, play, leisure, and social participation.   COMORBIDITIES:  may have co-morbidities  that affects occupational performance. Patient will benefit from skilled OT to address above impairments and improve overall function.  MODIFICATION OR ASSISTANCE TO COMPLETE EVALUATION: Min-Moderate modification of tasks or assist with assess necessary to complete an evaluation.  OT OCCUPATIONAL PROFILE AND HISTORY: Detailed assessment: Review of records and additional review of physical, cognitive, psychosocial history related to current functional performance.  CLINICAL DECISION MAKING: Moderate - several treatment options, min-mod task modification necessary  REHAB POTENTIAL: Good  EVALUATION COMPLEXITY: Low    PLAN:  OT FREQUENCY: 2x/week  OT DURATION: 12 weeks  PLANNED INTERVENTIONS: 97168 OT Re-evaluation, 97535 self care/ADL training, 02889 therapeutic exercise, 97530 therapeutic activity, 97112 neuromuscular re-education, 97140 manual therapy, 97116 gait training, 02886 aquatic therapy, 97035 ultrasound, 97018 paraffin, 02989 moist heat, 97010 cryotherapy, 97034 contrast bath, 97129 Cognitive training (first 15 min), 02869 Cognitive training(each additional 15 min), passive range of motion, balance training, functional mobility training, visual/perceptual remediation/compensation, energy conservation, coping strategies training, patient/family education, and DME and/or AE instructions  RECOMMENDED OTHER SERVICES: n/a  CONSULTED AND AGREED WITH PLAN OF CARE: Patient  PLAN FOR NEXT SESSION: continue to work toward updated goals, coordination/ functional use of RUE. Genelle Economou, OT 11/14/2023, 4:15 PM

## 2023-11-17 ENCOUNTER — Encounter: Payer: Self-pay | Admitting: Physical Therapy

## 2023-11-17 ENCOUNTER — Ambulatory Visit: Admitting: Physical Therapy

## 2023-11-17 DIAGNOSIS — R262 Difficulty in walking, not elsewhere classified: Secondary | ICD-10-CM

## 2023-11-17 DIAGNOSIS — R278 Other lack of coordination: Secondary | ICD-10-CM

## 2023-11-17 DIAGNOSIS — G20B2 Parkinson's disease with dyskinesia, with fluctuations: Secondary | ICD-10-CM

## 2023-11-17 DIAGNOSIS — R41840 Attention and concentration deficit: Secondary | ICD-10-CM

## 2023-11-17 NOTE — Therapy (Signed)
 OUTPATIENT PHYSICAL THERAPY NEURO TREATMENT    Patient Name: Patrick Ross MRN: [id number] DOB:Feb 20, 1946, 78 y.o., male Today's Date: 11/17/2023   PCP: Millicent Sharper MD REFERRING PROVIDER: Evonnie Asberry RAMAN DO  END OF SESSION:  PT End of Session - 11/17/23 1428     Visit Number 24    Date for Recertification  12/03/23    PT Start Time 1430    PT Stop Time 1515    PT Time Calculation (min) 45 min    Activity Tolerance Patient tolerated treatment well    Behavior During Therapy WFL for tasks assessed/performed                      Past Medical History:  Diagnosis Date   Depression    DVT (deep venous thrombosis) (HCC)    GERD (gastroesophageal reflux disease)    History of kidney stones    passed   Hypertension    Hypothyroidism    Lumbar stenosis    OSA (obstructive sleep apnea)    wears cpap   Parkinson's disease (HCC)    PONV (postoperative nausea and vomiting)    REM behavioral disorder    Urinary incontinence    Past Surgical History:  Procedure Laterality Date   BACK SURGERY     x 2 - not fusion   BIOPSY  06/01/2022   Procedure: BIOPSY;  Surgeon: Dianna Specking, MD;  Location: [facility];  Service: Gastroenterology;;   COLONOSCOPY     DEEP BRAIN STIMULATOR PLACEMENT     left 12/27/2012, right 12/10/2011, left revision 02/02/2013, replacement bilateral 11/21/2015   ESOPHAGOGASTRODUODENOSCOPY (EGD) WITH PROPOFOL  N/A 06/01/2022   Procedure: ESOPHAGOGASTRODUODENOSCOPY (EGD) WITH PROPOFOL ;  Surgeon: Dianna Specking, MD;  Location: [facility];  Service: Gastroenterology;  Laterality: N/A;   MINOR PLACEMENT OF FIDUCIAL N/A 01/09/2019   Procedure: Fiducial placement;  Surgeon: Unice Pac, MD;  Location: [facility] OR;  Service: Neurosurgery;  Laterality: N/A;  Fiducial placement   PARTIAL NEPHRECTOMY Right 2005 ish   PULSE GENERATOR IMPLANT Right 01/23/2019   Procedure: Right chest implantable pulse generator change;  Surgeon: Unice Pac, MD;   Location: [facility] OR;  Service: Neurosurgery;  Laterality: Right;   SPINAL FUSION     lumbar Fusion x 2   SUBTHALAMIC STIMULATOR BATTERY REPLACEMENT Left 02/02/2018   Procedure: Change implantable pulse generator battery, Left chest;  Surgeon: Unice Pac, MD;  Location: [facility] OR;  Service: Neurosurgery;  Laterality: Left;   SUBTHALAMIC STIMULATOR BATTERY REPLACEMENT N/A 01/22/2020   Procedure: REMOVAL OF LEFT BRAIN DEEP BRAIN STIMULATOR ELECTRODE, EXTENSION, AND PULSE GENERATOR;  Surgeon: Unice Pac, MD;  Location: [facility] OR;  Service: Neurosurgery;  Laterality: N/A;   SUBTHALAMIC STIMULATOR BATTERY REPLACEMENT Right 02/23/2022   Procedure: CHANGE IPG BATTERY, RT CHEST;  Surgeon: Dawley, Lani JAYSON, DO;  Location: [facility] OR;  Service: Neurosurgery;  Laterality: Right;   SUBTHALAMIC STIMULATOR INSERTION Left 01/16/2019   Procedure: Left deep brain stimulator electrode revision.;  Surgeon: Unice Pac, MD;  Location: [facility] OR;  Service: Neurosurgery;  Laterality: Left;   SUBTHALAMIC STIMULATOR INSERTION Left 01/23/2019   Procedure: Repositioning of Left Deep Brain Stimulator Electrode;  Surgeon: Unice Pac, MD;  Location: [facility] OR;  Service: Neurosurgery;  Laterality: Left;   TONSILLECTOMY     Patient Active Problem List   Diagnosis Date Noted   Melena 06/01/2022   Acute cystitis without hematuria 05/31/2022   Upper GI bleed 05/31/2022   Generalized weakness 05/30/2022   Abdominal pain 05/30/2022   OSA (  obstructive sleep apnea) 05/30/2022   Hypothyroidism 05/30/2022   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) 12/14/2021   Mixed hyperlipidemia 12/14/2021   Essential hypertension 12/14/2021   Palpitations 12/14/2021   Angina pectoris (HCC) 12/14/2021   Urge incontinence 01/15/2021   History of UTI 01/15/2021   Abnormal urine findings 01/15/2021   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  Rationale for Evaluation and Treatment: Rehabilitation  SUBJECTIVE:                                                                                                                                                                                             SUBJECTIVE STATEMENT:  Did 5 miles in the cycling class thins morning.    EVAL-Pt and his wife report long history of parkinson's,  biggest difficulty with his mobility that they note is R lower back  pain and labored supine to sit movement, also poor foot clearance.  He doesn't like relying on the walker Pt accompanied by: significant other  PERTINENT HISTORY: parkinson's diagnosed greater than 10 yrs ago, deep brain stimulator R, had in L as well but had to be removed for various reasons.  walks around neighborhood 3 x week with aide Tuesdays and Thursdays parkisons bicycle class 2 x Drue training for upper body work out Referred to PT and ST by neurologist   PAIN:  Are you having pain? R hip  3-4/10, constant , getting OOB in the morning makes it worse, nothing makes it better  PRECAUTIONS: Fall and Other: deep brain stimulator on R   RED FLAGS: None   WEIGHT BEARING RESTRICTIONS: No  FALLS: Has patient fallen in last 6 months? No  LIVING ENVIRONMENT: Lives with: lives with their spouse Lives in: House/apartment Stairs: has internal steps, but has had home renovated and adapted so the he and his wife live in downstairs apartment, all one level with bathroom and kitchen Has following equipment at home: Single point cane, Environmental consultant - 4  wheeled, Tour manager, Grab bars, and Ramped entry  PLOF: Independent  PATIENT GOALS: improve bed mobility and improve gait, stability, avoid falls  OBJECTIVE:  Note: Objective measures were completed at Evaluation  unless otherwise noted.  DIAGNOSTIC FINDINGS: see neuro notes, has deep brain stimulator R   COGNITION: Overall cognitive status: Within functional limits for tasks assessed   SENSATION: WFL  COORDINATION: Slowed movements B and noted curling R hand and R foot at times, with gait festinating on turns   EDEMA:  None noted  MUSCLE TONE: noted curling R fingers, toes, knee flex and hip flex at times, randomly during evaluation  MUSCLE LENGTH: Hamstrings: Right -30 deg; Left -30 deg Thomas test: Right nt deg; Left nt deg    POSTURE: Standing with some forward lean, weight on forefeet, L calf atrophied pt and wife report chronic after L lumbar surgery.     LOWER EXTREMITY ROM:   grossly wfl, hips not assessed as not positioned on mat today   LOWER EXTREMITY MMT:    MMT Right Eval Left Eval  Hip flexion 4 4-  Hip extension    Hip abduction    Hip adduction    Hip internal rotation    Hip external rotation    Knee flexion 4 4  Knee extension 4 4-  Ankle dorsiflexion 4 3+  Ankle plantarflexion 4- 3-  Ankle inversion    Ankle eversion    (Blank rows = not tested)  BED MOBILITY:  Not tested  TRANSFERS: I but slowed, uses hands on seat or with rollator  GAIT: Findings: Gait Characteristics: step to pattern, decreased step length- Right, decreased step length- Left, decreased stride length, decreased hip/knee flexion- Right, decreased hip/knee flexion- Left, shuffling, festinating, trunk flexed, and narrow BOS, Distance walked: 100', Assistive device utilized:Walker - 4 wheeled, Level of assistance: SBA, and Comments: festinating noted on turns, tends to turn L  FUNCTIONAL TESTS:  30 seconds chair stand test 6 reps Timed up and go (TUG): 33.39 with  rollator Standing static feet together 15 sec Standing semi tandem 10 sec each leg, needed CGA with L foot forward Standing marching, required B hand hold support to maintain balance                                                                                                                              TREATMENT DATE: 11/17/23 NuStep L5 x 7 min Sit to stand elevated surface OHP w/ yellow ball x8, x6  heavy focus on control and form Side step at mat able  Standing march 2lb cuff 2x10 each w/ rolator  X10 without rolator  Seated shoulder Flex w/ 3lb WaTE  Walking 2 laps- cues to take big steps     11/03/23 Gait with rollator outside with CGA, negotiated curbs a few times, at times shuffles significantly TUG noted below STS as noted below HHA walking 100 feet Direction changes Supine feet on ball K2C, rotation, bridge and abs Bed mobility to and from supine to sitting very difficult for him due to right low back pain   10/27/23  Seated PWR up with 2# Wate bar- reaching forward to  tap bolster then PWR up x8 Seated PWR reach x6 B  PWR step + twist x8 B STS from high mat table + yellow ball chest press x10   Nustep L5x8 minutes BLEs only  Walking forward and backward with U-walker, cues for big steps   10/19/23 STS with chest press 3# WaTE 2x10 Punches with 4# standing Modified sit up with yellow ball 2x10  NuStep L5 x1mins  Leg ext 10# 2x10 HS curls 25# 2x10 Ball catch Walking 2 laps- cues to take big steps    10/17/23 Recheck goals for PN Standing reaching yellow weighted ball at 4 targets on wall- had to sit after 3 reps due to pt feeling weak esp in his R arm Standing march with walker  NuStep L5 x55mins- mostly legs  LAQ 5# ankle weights      PATIENT EDUCATION: Education details: POC, goals Person educated: Patient and Spouse Education method: Explanation, Demonstration, Tactile cues, and Verbal cues Education comprehension: verbalized understanding, verbal  cues required, and needs further education  HOME EXERCISE PROGRAM: Access Code: [email or web address] URL: [email or web address] Date: 08/02/2023 Prepared by: Almetta Fam  Exercises - Supine Hamstring Stretch with Strap  - 2 x daily - 7 x weekly - 2 reps - 15 hold - Modified Thomas Stretch  - 2 x daily - 7 x weekly - 2 reps - 15 hold - Supine Bridge  - 1 x daily - 7 x weekly - 2 sets - 10 reps - Standing Shoulder Horizontal Abduction with Resistance  - 1 x daily - 7 x weekly - 2 sets - 10 reps  GOALS: Goals reviewed with patient? Yes  SHORT TERM GOALS: Target date: 4 weeks, 08/03/23  I home program for posture and for hamstring, hip flexor stretchig Baseline: Goal status: ongoing 08/02/23, ongoing 08/23/23, MET 10/12/23  LONG TERM GOALS: Target date: 10/31/23  30 sec sit to stand improve from 6 reps to 12 reps for improved coordination, strength, endurance LEs Baseline:  Goal status: 7 reps ongoing 08/04/23, 8 reps 08/23/23, 11 reps 09/26/23, 10 reps 10/17/23, 9 reps 11/03/23  2.  I and pain free supine to sit motion , rolling to R Baseline: very painful per pt and wife history, not specifically assessed in clinic today Goal status: ongoing 07/28/23, ongoing 08/09/23, still has pain with bed mobility 08/23/23, can do but still painful 09/26/23, does not hurt as bad 10/12/23, still hurting with this 11/03/23  3.  TUG score improve from 33 sec to 18 or less for reduced fall risk   Baseline:  Goal status: 27s ongoing 08/04/23, 22s 08/23/23, 20s 09/26/23, 20s 10/17/23, 11/03/23 20 seconds, difficulty with the turns  4.  Improve strength LE's for B quads, hamstrings to 4+5 for improved functional strength Baseline: 4- Goal status: 4/5 08/04/23, 5/5 MET 08/23/23   ASSESSMENT:  CLINICAL IMPRESSION:  Patient enters doing well, reports attending a cycling class this morning. Heavy focus on functional interventions with a focus on control and technique. Constant cues needed with sit to sands not to let LE  push against table and to control the decent. Fatigue present with side steps and marches. Pt would occasionally freeze while doing activity.  Good effort overall in session. Pt did report fatigue post session   EVAL Patient is a 78 y.o. male who was evaluated today by physical therapy due to referral from his neurologist, with decline in function, particularly control of R extremities, with his Parkinson's diagnosis.  He and his wife have been proactive about  maintaining his function, he attends a training/ strengthening session locally 2 x week , attends parkinson's cycling weekly, and walks around 0.6 miles with his aide 3 x week outdoors with his rollator.  They recently have adapted their home with a main floor renovation so that their bedroom, bath, kitchen are all on one level.   Main problems identified today are weakness B LE's, L ankle is weak chronically due to lumbar radiculopathy and B knee, hips musculature is weak.  He also has coordination deficits with festinating gait particularly on turns and static / dynamic balance deficits.  Has chronic R sided lower back pain which is particularly intense with moving supine to sit.  He should benefit from physical therapy intervention to address his deficits and update his current routine for stretching and strengthening at home.    OBJECTIVE IMPAIRMENTS: Abnormal gait, decreased activity tolerance, decreased balance, decreased coordination, decreased endurance, decreased mobility, difficulty walking, decreased strength, hypomobility, impaired perceived functional ability, impaired UE functional use, postural dysfunction, and pain.   ACTIVITY LIMITATIONS: carrying, lifting, squatting, stairs, transfers, and locomotion level  PARTICIPATION LIMITATIONS: meal prep, cleaning, laundry, shopping, community activity, and church  PERSONAL FACTORS: Age, Behavior pattern, Fitness, Past/current experiences, Time since onset of injury/illness/exacerbation,  and 1-2 comorbidities: h/o L lumbar radiculopathy and weakness L LE, chronic R sided lower back pain, are also affecting patient's functional outcome.   REHAB POTENTIAL: Good  CLINICAL DECISION MAKING: Evolving/moderate complexity  EVALUATION COMPLEXITY: Moderate  PLAN:  PT FREQUENCY: 2x/week  PT DURATION: 12 weeks  PLANNED INTERVENTIONS: 97110-Therapeutic exercises, 97530- Therapeutic activity, [id number]- Neuromuscular re-education, 97535- Self Care, and [id number]- Manual therapy  PLAN FOR NEXT SESSION: continue with balance, stretching, coordination activities, PWR based activities , renewal performed   Tanda Sorrow, PTA 11/17/23 2:28 PM

## 2023-11-22 ENCOUNTER — Encounter: Admitting: Occupational Therapy

## 2023-11-22 ENCOUNTER — Encounter: Admitting: Physical Therapy

## 2023-11-23 ENCOUNTER — Ambulatory Visit

## 2023-11-23 ENCOUNTER — Other Ambulatory Visit: Payer: Self-pay

## 2023-11-23 ENCOUNTER — Ambulatory Visit: Admitting: Occupational Therapy

## 2023-11-23 DIAGNOSIS — R262 Difficulty in walking, not elsewhere classified: Secondary | ICD-10-CM

## 2023-11-23 DIAGNOSIS — R29818 Other symptoms and signs involving the nervous system: Secondary | ICD-10-CM

## 2023-11-23 DIAGNOSIS — R41840 Attention and concentration deficit: Secondary | ICD-10-CM

## 2023-11-23 DIAGNOSIS — R278 Other lack of coordination: Secondary | ICD-10-CM | POA: Diagnosis not present

## 2023-11-23 DIAGNOSIS — G20B2 Parkinson's disease with dyskinesia, with fluctuations: Secondary | ICD-10-CM

## 2023-11-23 DIAGNOSIS — R2689 Other abnormalities of gait and mobility: Secondary | ICD-10-CM

## 2023-11-23 DIAGNOSIS — R41844 Frontal lobe and executive function deficit: Secondary | ICD-10-CM

## 2023-11-23 NOTE — Therapy (Signed)
 OUTPATIENT PHYSICAL THERAPY NEURO TREATMENT    Patient Name: Patrick Ross MRN: [id number] DOB:1945/12/01, 78 y.o., male Today's Date: 11/23/2023   PCP: Millicent Sharper MD REFERRING PROVIDER: Evonnie Asberry RAMAN DO  END OF SESSION:  PT End of Session - 11/23/23 1517     Visit Number 25    Date for Recertification  12/03/23    Authorization Type Medicare    Progress Note Due on Visit 30    PT Start Time 1518    PT Stop Time 1600    PT Time Calculation (min) 42 min    Activity Tolerance Patient limited by fatigue    Behavior During Therapy WFL for tasks assessed/performed                       Past Medical History:  Diagnosis Date   Depression    DVT (deep venous thrombosis) (HCC)    GERD (gastroesophageal reflux disease)    History of kidney stones    passed   Hypertension    Hypothyroidism    Lumbar stenosis    OSA (obstructive sleep apnea)    wears cpap   Parkinson's disease (HCC)    PONV (postoperative nausea and vomiting)    REM behavioral disorder    Urinary incontinence    Past Surgical History:  Procedure Laterality Date   BACK SURGERY     x 2 - not fusion   BIOPSY  06/01/2022   Procedure: BIOPSY;  Surgeon: Dianna Specking, MD;  Location: [facility];  Service: Gastroenterology;;   COLONOSCOPY     DEEP BRAIN STIMULATOR PLACEMENT     left 12/27/2012, right 12/10/2011, left revision 02/02/2013, replacement bilateral 11/21/2015   ESOPHAGOGASTRODUODENOSCOPY (EGD) WITH PROPOFOL  N/A 06/01/2022   Procedure: ESOPHAGOGASTRODUODENOSCOPY (EGD) WITH PROPOFOL ;  Surgeon: Dianna Specking, MD;  Location: [facility];  Service: Gastroenterology;  Laterality: N/A;   MINOR PLACEMENT OF FIDUCIAL N/A 01/09/2019   Procedure: Fiducial placement;  Surgeon: Unice Pac, MD;  Location: [facility] OR;  Service: Neurosurgery;  Laterality: N/A;  Fiducial placement   PARTIAL NEPHRECTOMY Right 2005 ish   PULSE GENERATOR IMPLANT Right 01/23/2019   Procedure: Right chest  implantable pulse generator change;  Surgeon: Unice Pac, MD;  Location: [facility] OR;  Service: Neurosurgery;  Laterality: Right;   SPINAL FUSION     lumbar Fusion x 2   SUBTHALAMIC STIMULATOR BATTERY REPLACEMENT Left 02/02/2018   Procedure: Change implantable pulse generator battery, Left chest;  Surgeon: Unice Pac, MD;  Location: [facility] OR;  Service: Neurosurgery;  Laterality: Left;   SUBTHALAMIC STIMULATOR BATTERY REPLACEMENT N/A 01/22/2020   Procedure: REMOVAL OF LEFT BRAIN DEEP BRAIN STIMULATOR ELECTRODE, EXTENSION, AND PULSE GENERATOR;  Surgeon: Unice Pac, MD;  Location: [facility] OR;  Service: Neurosurgery;  Laterality: N/A;   SUBTHALAMIC STIMULATOR BATTERY REPLACEMENT Right 02/23/2022   Procedure: CHANGE IPG BATTERY, RT CHEST;  Surgeon: Dawley, Lani JAYSON, DO;  Location: [facility] OR;  Service: Neurosurgery;  Laterality: Right;   SUBTHALAMIC STIMULATOR INSERTION Left 01/16/2019   Procedure: Left deep brain stimulator electrode revision.;  Surgeon: Unice Pac, MD;  Location: [facility] OR;  Service: Neurosurgery;  Laterality: Left;   SUBTHALAMIC STIMULATOR INSERTION Left 01/23/2019   Procedure: Repositioning of Left Deep Brain Stimulator Electrode;  Surgeon: Unice Pac, MD;  Location: [facility] OR;  Service: Neurosurgery;  Laterality: Left;   TONSILLECTOMY     Patient Active Problem List   Diagnosis Date Noted   Melena 06/01/2022   Acute cystitis without hematuria 05/31/2022   Upper  GI bleed 05/31/2022   Generalized weakness 05/30/2022   Abdominal pain 05/30/2022   OSA (obstructive sleep apnea) 05/30/2022   Hypothyroidism 05/30/2022   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) 12/14/2021   Mixed hyperlipidemia 12/14/2021   Essential hypertension 12/14/2021   Palpitations 12/14/2021   Angina pectoris 12/14/2021   Urge incontinence 01/15/2021   History of UTI 01/15/2021   Abnormal urine findings 01/15/2021   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   History of nephrolithiasis 01/15/2021   DVT,  lower extremity, proximal, acute, right (HCC) 02/26/2020   Open scalp wound 01/22/2020   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HCC)    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   Parkinson's disease (HCC) 01/16/2018   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) 01/16/2018   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    ONSET DATE: exacerbation 06/21/23, Parkinson's diagnosis 2010  REFERRING DIAG: parkinsons disease with fluctuating dyskinesia  THERAPY DIAG:  Other lack of coordination  Attention and concentration deficit  Other symptoms and signs involving the nervous system  Other abnormalities of gait and mobility  Parkinson's disease with dyskinesia, with fluctuations (HCC)  Difficulty in walking, not elsewhere classified  Rationale for Evaluation and Treatment: Rehabilitation  SUBJECTIVE:                                                                                                                                                                                             SUBJECTIVE STATEMENT:  Reports freezing more today, denies any change in medical status. More tired today   EVAL-Pt and his wife report long history of parkinson's,  biggest difficulty with his mobility that they note is R lower back  pain and labored supine to sit movement, also poor foot clearance.  He doesn't like relying on the walker Pt accompanied by: significant other  PERTINENT HISTORY: parkinson's diagnosed greater than 10 yrs ago, deep brain stimulator R, had in L as well but had to be removed for various reasons.  walks around neighborhood 3 x week with aide Tuesdays and Thursdays parkisons bicycle class 2 x Drue training for upper body work out Referred to PT and ST by neurologist   PAIN:  Are you having pain? R hip  3-4/10, constant , getting OOB in the morning makes it worse, nothing makes it better  PRECAUTIONS: Fall and Other: deep brain stimulator on R   RED FLAGS: None   WEIGHT BEARING RESTRICTIONS:  No  FALLS: Has patient fallen in last 6 months? No  LIVING ENVIRONMENT: Lives with: lives with their spouse Lives in: House/apartment Stairs: has internal steps, but has had home renovated and adapted so the he and his wife  live in downstairs apartment, all one level with bathroom and kitchen Has following equipment at home: Single point cane, Walker - 4 wheeled, Shower bench, Grab bars, and Ramped entry  PLOF: Independent  PATIENT GOALS: improve bed mobility and improve gait, stability, avoid falls  OBJECTIVE:  Note: Objective measures were completed at Evaluation unless otherwise noted.  DIAGNOSTIC FINDINGS: see neuro notes, has deep brain stimulator R   COGNITION: Overall cognitive status: Within functional limits for tasks assessed   SENSATION: WFL  COORDINATION: Slowed movements B and noted curling R hand and R foot at times, with gait festinating on turns   EDEMA:  None noted  MUSCLE TONE: noted curling R fingers, toes, knee flex and hip flex at times, randomly during evaluation  MUSCLE LENGTH: Hamstrings: Right -30 deg; Left -30 deg Thomas test: Right nt deg; Left nt deg    POSTURE: Standing with some forward lean, weight on forefeet, L calf atrophied pt and wife report chronic after L lumbar surgery.     LOWER EXTREMITY ROM:   grossly wfl, hips not assessed as not positioned on mat today   LOWER EXTREMITY MMT:    MMT Right Eval Left Eval  Hip flexion 4 4-  Hip extension    Hip abduction    Hip adduction    Hip internal rotation    Hip external rotation    Knee flexion 4 4  Knee extension 4 4-  Ankle dorsiflexion 4 3+  Ankle plantarflexion 4- 3-  Ankle inversion    Ankle eversion    (Blank rows = not tested)  BED MOBILITY:  Not tested  TRANSFERS: I but slowed, uses hands on seat or with rollator  GAIT: Findings: Gait Characteristics: step to pattern, decreased step length- Right, decreased step length- Left, decreased stride length,  decreased hip/knee flexion- Right, decreased hip/knee flexion- Left, shuffling, festinating, trunk flexed, and narrow BOS, Distance walked: 100', Assistive device utilized:Walker - 4 wheeled, Level of assistance: SBA, and Comments: festinating noted on turns, tends to turn L  FUNCTIONAL TESTS:  30 seconds chair stand test 6 reps Timed up and go (TUG): 33.39 with rollator Standing static feet together 15 sec Standing semi tandem 10 sec each leg, needed CGA with L foot forward Standing marching, required B hand hold support to maintain balance                                                                                                                              TREATMENT DATE: 11/23/23:  Nustep L 5 x Standing in front of hi low table, for side to side swings with 2# medibar Sit to stand with red mediball forward punches Standing high marches to 10' target, holding rollator  Standing modified RDL's semi tandem standing, reaches with 4# wt in B hands to rollator seat Supine with 85cm physioball under feet, B knee to chest, bridging, alt SLR Gait for/ back length of mat table, 4 x,  cues for larger steps   11/17/23 NuStep L5 x 7 min Sit to stand elevated surface OHP w/ yellow ball x8, x6  heavy focus on control and form Side step at mat able  Standing march 2lb cuff 2x10 each w/ rolator  X10 without rolator  Seated shoulder Flex w/ 3lb WaTE  Walking 2 laps- cues to take big steps     11/03/23 Gait with rollator outside with CGA, negotiated curbs a few times, at times shuffles significantly TUG noted below STS as noted below HHA walking 100 feet Direction changes Supine feet on ball K2C, rotation, bridge and abs Bed mobility to and from supine to sitting very difficult for him due to right low back pain   10/27/23  Seated PWR up with 2# Wate bar- reaching forward to tap bolster then PWR up x8 Seated PWR reach x6 B  PWR step + twist x8 B STS from high mat table + yellow  ball chest press x10   Nustep L5x8 minutes BLEs only  Walking forward and backward with U-walker, cues for big steps   10/19/23 STS with chest press 3# WaTE 2x10 Punches with 4# standing Modified sit up with yellow ball 2x10  NuStep L5 x73mins  Leg ext 10# 2x10 HS curls 25# 2x10 Ball catch Walking 2 laps- cues to take big steps    10/17/23 Recheck goals for PN Standing reaching yellow weighted ball at 4 targets on wall- had to sit after 3 reps due to pt feeling weak esp in his R arm Standing march with walker  NuStep L5 x9mins- mostly legs  LAQ 5# ankle weights      PATIENT EDUCATION: Education details: POC, goals Person educated: Patient and Spouse Education method: Explanation, Demonstration, Tactile cues, and Verbal cues Education comprehension: verbalized understanding, verbal cues required, and needs further education  HOME EXERCISE PROGRAM: Access Code: [email or web address] URL: [email or web address] Date: 08/02/2023 Prepared by: Almetta Fam  Exercises - Supine Hamstring Stretch with Strap  - 2 x daily - 7 x weekly - 2 reps - 15 hold - Modified Thomas Stretch  - 2 x daily - 7 x weekly - 2 reps - 15 hold - Supine Bridge  - 1 x daily - 7 x weekly - 2 sets - 10 reps - Standing Shoulder Horizontal Abduction with Resistance  - 1 x daily - 7 x weekly - 2 sets - 10 reps  GOALS: Goals reviewed with patient? Yes  SHORT TERM GOALS: Target date: 4 weeks, 08/03/23  I home program for posture and for hamstring, hip flexor stretchig Baseline: Goal status: ongoing 08/02/23, ongoing 08/23/23, MET 10/12/23  LONG TERM GOALS: Target date: 10/31/23  30 sec sit to stand improve from 6 reps to 12 reps for improved coordination, strength, endurance LEs Baseline:  Goal status: 7 reps ongoing 08/04/23, 8 reps 08/23/23, 11 reps 09/26/23, 10 reps 10/17/23, 9 reps 11/03/23  2.  I and pain free supine to sit motion , rolling to R Baseline: very painful per pt and wife history, not specifically  assessed in clinic today Goal status: ongoing 07/28/23, ongoing 08/09/23, still has pain with bed mobility 08/23/23, can do but still painful 09/26/23, does not hurt as bad 10/12/23, still hurting with this 11/03/23  3.  TUG score improve from 33 sec to 18 or less for reduced fall risk   Baseline:  Goal status: 27s ongoing 08/04/23, 22s 08/23/23, 20s 09/26/23, 20s 10/17/23, 11/03/23 20 seconds, difficulty with the turns  4.  Improve strength LE's  for B quads, hamstrings to 4+5 for improved functional strength Baseline: 4- Goal status: 4/5 08/04/23, 5/5 MET 08/23/23   ASSESSMENT:  CLINICAL IMPRESSION:  Patient more unsteady today than in previous visits with freezing gait frequently, also near falls with standing activities, had to be very careful and tailor his routine to avoid falls today .  He is more fatigued as well.  Reports fluctuation with his stability and control, today just a poor day.  Will continue to address and progress his routine as appropriate.  He returns to neurologist in October.   EVAL Patient is a 78 y.o. male who was evaluated today by physical therapy due to referral from his neurologist, with decline in function, particularly control of R extremities, with his Parkinson's diagnosis.  He and his wife have been proactive about maintaining his function, he attends a training/ strengthening session locally 2 x week , attends parkinson's cycling weekly, and walks around 0.6 miles with his aide 3 x week outdoors with his rollator.  They recently have adapted their home with a main floor renovation so that their bedroom, bath, kitchen are all on one level.   Main problems identified today are weakness B LE's, L ankle is weak chronically due to lumbar radiculopathy and B knee, hips musculature is weak.  He also has coordination deficits with festinating gait particularly on turns and static / dynamic balance deficits.  Has chronic R sided lower back pain which is particularly intense with  moving supine to sit.  He should benefit from physical therapy intervention to address his deficits and update his current routine for stretching and strengthening at home.    OBJECTIVE IMPAIRMENTS: Abnormal gait, decreased activity tolerance, decreased balance, decreased coordination, decreased endurance, decreased mobility, difficulty walking, decreased strength, hypomobility, impaired perceived functional ability, impaired UE functional use, postural dysfunction, and pain.   ACTIVITY LIMITATIONS: carrying, lifting, squatting, stairs, transfers, and locomotion level  PARTICIPATION LIMITATIONS: meal prep, cleaning, laundry, shopping, community activity, and church  PERSONAL FACTORS: Age, Behavior pattern, Fitness, Past/current experiences, Time since onset of injury/illness/exacerbation, and 1-2 comorbidities: h/o L lumbar radiculopathy and weakness L LE, chronic R sided lower back pain, are also affecting patient's functional outcome.   REHAB POTENTIAL: Good  CLINICAL DECISION MAKING: Evolving/moderate complexity  EVALUATION COMPLEXITY: Moderate  PLAN:  PT FREQUENCY: 2x/week  PT DURATION: 12 weeks  PLANNED INTERVENTIONS: 97110-Therapeutic exercises, 97530- Therapeutic activity, 97112- Neuromuscular re-education, 97535- Self Care, and [id number]- Manual therapy  PLAN FOR NEXT SESSION: continue with balance, stretching, coordination activities, PWR based activities , renewal performed  Greig Credit, PT, DPT, OCS 11/23/23 5:09 PM

## 2023-11-23 NOTE — Therapy (Signed)
 OUTPATIENT OCCUPATIONAL THERAPY [facility] treatment  Patient Name: Patrick Ross MRN: [id number] DOB:07/12/1945, 78 y.o., male Today's Date: 11/23/2023  PCP: Dr. Millicent REFERRING PROVIDER:Dr. Tat  END OF SESSION:  OT End of Session - 11/23/23 1447     Visit Number 17    Number of Visits 25    Date for Recertification  01/26/24    Authorization Type MCR, [employer]    Authorization Time Period 12 weeks, anticipate d/c after 8 weeks    Authorization - Visit Number 17    Progress Note Due on Visit 20    OT Start Time 1404    OT Stop Time 1447    OT Time Calculation (min) 43 min    Activity Tolerance Patient tolerated treatment well    Behavior During Therapy WFL for tasks assessed/performed                   Past Medical History:  Diagnosis Date   Depression    DVT (deep venous thrombosis) (HCC)    GERD (gastroesophageal reflux disease)    History of kidney stones    passed   Hypertension    Hypothyroidism    Lumbar stenosis    OSA (obstructive sleep apnea)    wears cpap   Parkinson's disease (HCC)    PONV (postoperative nausea and vomiting)    REM behavioral disorder    Urinary incontinence    Past Surgical History:  Procedure Laterality Date   BACK SURGERY     x 2 - not fusion   BIOPSY  06/01/2022   Procedure: BIOPSY;  Surgeon: Dianna Specking, MD;  Location: [facility];  Service: Gastroenterology;;   COLONOSCOPY     DEEP BRAIN STIMULATOR PLACEMENT     left 12/27/2012, right 12/10/2011, left revision 02/02/2013, replacement bilateral 11/21/2015   ESOPHAGOGASTRODUODENOSCOPY (EGD) WITH PROPOFOL  N/A 06/01/2022   Procedure: ESOPHAGOGASTRODUODENOSCOPY (EGD) WITH PROPOFOL ;  Surgeon: Dianna Specking, MD;  Location: [facility];  Service: Gastroenterology;  Laterality: N/A;   MINOR PLACEMENT OF FIDUCIAL N/A 01/09/2019   Procedure: Fiducial placement;  Surgeon: Unice Pac, MD;  Location: [facility] OR;  Service: Neurosurgery;  Laterality: N/A;  Fiducial placement    PARTIAL NEPHRECTOMY Right 2005 ish   PULSE GENERATOR IMPLANT Right 01/23/2019   Procedure: Right chest implantable pulse generator change;  Surgeon: Unice Pac, MD;  Location: [facility] OR;  Service: Neurosurgery;  Laterality: Right;   SPINAL FUSION     lumbar Fusion x 2   SUBTHALAMIC STIMULATOR BATTERY REPLACEMENT Left 02/02/2018   Procedure: Change implantable pulse generator battery, Left chest;  Surgeon: Unice Pac, MD;  Location: [facility] -  OR;  Service: Neurosurgery;  Laterality: Left;   SUBTHALAMIC STIMULATOR BATTERY REPLACEMENT N/A 01/22/2020   Procedure: REMOVAL OF LEFT BRAIN DEEP BRAIN STIMULATOR ELECTRODE, EXTENSION, AND PULSE GENERATOR;  Surgeon: Unice Pac, MD;  Location: [facility] OR;  Service: Neurosurgery;  Laterality: N/A;   SUBTHALAMIC STIMULATOR BATTERY REPLACEMENT Right 02/23/2022   Procedure: CHANGE IPG BATTERY, RT CHEST;  Surgeon: Dawley, Lani JAYSON, DO;  Location: [facility] OR;  Service: Neurosurgery;  Laterality: Right;   SUBTHALAMIC STIMULATOR INSERTION Left 01/16/2019   Procedure: Left deep brain stimulator electrode revision.;  Surgeon: Unice Pac, MD;  Location: [facility] OR;  Service: Neurosurgery;  Laterality: Left;   SUBTHALAMIC STIMULATOR INSERTION Left 01/23/2019   Procedure: Repositioning of Left Deep Brain Stimulator Electrode;  Surgeon: Unice Pac, MD;  Location: [facility] OR;  Service: Neurosurgery;  Laterality: Left;   TONSILLECTOMY     Patient Active Problem List  Diagnosis Date Noted   Melena 06/01/2022   Acute cystitis without hematuria 05/31/2022   Upper GI bleed 05/31/2022   Generalized weakness 05/30/2022   Abdominal pain 05/30/2022   OSA (obstructive sleep apnea) 05/30/2022   Hypothyroidism 05/30/2022   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) 12/14/2021   Mixed hyperlipidemia 12/14/2021   Essential hypertension 12/14/2021   Palpitations 12/14/2021   Angina pectoris 12/14/2021   Urge incontinence 01/15/2021   History of UTI 01/15/2021   Abnormal urine  findings 01/15/2021   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   History of nephrolithiasis 01/15/2021   DVT, lower extremity, proximal, acute, right (HCC) 02/26/2020   Open scalp wound 01/22/2020   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HCC)    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   Parkinson's disease (HCC) 01/16/2018   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  Rationale for Evaluation and Treatment: Rehabilitation  SUBJECTIVE:   SUBJECTIVE STATEMENT: Pt reports continue difficulty using RUE Pt accompanied by: self  PERTINENT HISTORY: parkinson's diagnosed greater than 10 yrs ago, deep brain stimulator R, had in L as well but had to be removed for various reasons. Depression, sleep apnea, hx of back surgery, see hx above walks around neighborhood 3 x week with aide Tuesdays and Thursdays parkisons bicycle class 2 x Drue training for upper body work out   PRECAUTIONS: Fall- PT is addressing  WEIGHT BEARING RESTRICTIONS: No  PAIN:  Are you having pain? Yes: NPRS scale: 4/10,  Pain location: back, hip Pain description: aching Aggravating factors: malposiitoning Relieving factors: rest,   FALLS: Has patient fallen in last 6 months? No  LIVING ENVIRONMENT: Lives with: lives with their spouse, has CNA 3x weeks Lives in: House/apartment Stairs: Yes: Internal: has stairt lift, and coverted garage appt on ground level Has following equipment at home: U-step  PLOF: Needs assistance with ADLs and Needs assistance with homemaking  PATIENT GOALS: Pt wants to be able to tie shoes, and more I with dressing  OBJECTIVE:  Note: Objective measures were completed at Evaluation  unless otherwise noted.  HAND DOMINANCE: Right  ADLs: Overall ADLs: increased time required  Transfers/ambulation related to ADLs: Eating: difficulty cutting food, needs assist  Grooming: mod I with brushing teeth, combing, CNA assists with shaving UB Dressing: mod I LB Dressing: mod-max A for LB dressing Toileting: mod I with U-step Bathing: supervision-walk in shower, level has built in seat and grab bars Tub Shower transfers: min A Equipment: Grab bars and level walk in shower with built in bench  IADLs:Dependent with home management  Medication management: wife assists Financial management: wife handles Handwriting: Moderate micrographia at sentence level  MOBILITY STATUS: mod I with U-step  POSTURE COMMENTS:  rounded shoulders and forward head   FUNCTIONAL OUTCOME MEASURES: Fastening/unfastening 3 buttons: 1 min 43 secs   COORDINATION: 9 Hole Peg test: Right: 55.08 sec; Left: 49.17 sec Box and Blocks:  Right 26blocks, Left 34blocks  UE ROM:  A/ROM:shoulder flexion RUE-100 LUE105, elbow extension RUE-35 LUE -25, decreased bilateral supination, RUE 95% finger extension, maintains fingers flexed    SENSATION: WFL    COGNITION: Overall cognitive status: Impaired, slowed processing and short term memory deficits  OBSERVATIONS: Bradykinesia and resting tremor RUE, dystonia  RUE                                                                                                                    TREATMENT DATE:11/23/22 seated PWR! up, then bag exerises for simulated ADLs. simulated donning shirt then cumpling the bag in each hand with left and right hands, mod/ max difficulty with RUE. Pt pracetice donning/ doffing jacket in seated and standing. Pt required minA for safety in standing seated, min A initaly, and min v.c Pt demonstrates improved safety and will benefit from practice. Simulated eating using foam grip on spoon, pt with improved performance. Pt was issued  red putty for sustained grip and pinch due to significnat weakness from botox . Pt was instructed that if his spasticiy, dystonia returns to stop using the putty. 11/14/23-modified quadraped rock at [employer], then [employer]! rock in standing, at countertop, Dynamic step and reach to each side to flip playing cards min-mod v.c for amplitude Marching at countertop, min v.c PWR! hands basic 4, min-mod v.c and demonstration. Handwriting activities using a variety of pens and grips. Pt trialed foam grip however he perfromed better with felt tip pen. mod v.c and demonstration for printing large. Pt was able to print his name legibily with practice using felt  tip pen and increased time.  11/09/23-modified quadraped rock at [employer], then [employer]! rock in standing, at countertop, mod v.c and demonstration. Reviewed PWR hands and coordination HEP and re-ssued so pt can practice with his hired caregiver.  11/03/23- reveiwed deep breathing strategies, min-mod v.c and demonstration  PWR! up in seated x 10 reps  modified quadraped rock at [employer], then [employer]! rock in standing, at countertop, mod v.c and demonstration.  PWR! hands for PWR! up, twist Therapist checked progress towards goals and discussed plans for renewal with pt/ wife. see goals below for progress.  Box/  blocks R 31, L 34 9 hole peg test: R 4 pegs in 2 mins, L 50.90 secs PPT#2 :16.15 secs unable to write name legibily  10/25/23- PWR! up in seated x 10 reps   modified quadraped rock at [employer], then [employer]! rock and step in standing, at countertop, mod v.c and demonstration. Pt's wife observed. Education regarding deep breathing exercises, mod v.c Strategies for self feeding with practice and use of foam grps with min v.c. Foam grips were issued for use at home. discussed importance of opening hand in full extension for grasp/ relase of a drinking cup. Therapsit recommends acup with a lid if pt in concerned about splls. Pt practiced A/ROM ulnar/  radial deviation at right wrist for increased ease with drinking. Discussed strategies for freezing and importance of warm up prior to going to mailbox.    10/19/23 UBE x 5 mins level 1 for conditioning PWR! up x 10 reps in seated for warm up , min v.c for amplitude Bag exercises for simulated ADLS in seated: cumpling bag to simulate donning socks, reach overhead imulating donning shirt and then behind back pass to simulate  tucking  in shirt min-mod v.c, mod difficulty with RUE functional use. PWR! hands, PWR! up, x 20 reps min v.c, standing at  countertop, functional reaching to place large pegs into pegboard with right ehn LUE, RUE fatigued quickly and was more challenging since botox . Pt took a rest break then he stood to remove large pegs with right and left UE's from vertical surface, mod difficulty and increased fatigue with RUE.   10/17/23- Pt practiced donning and doffing shorts over legs using reacher to assist mod A and v.c. Pt was able to remove his shoes mod I however he required min A and mod v.c to donn. Pt presents with increased difficulty using RUE today s/p botox  injection. Pt reports increased weakness and difficulty using his RUE. Standing at countertop Modified quadruped PWR! rock, min facilitation/ v.c  then PWR! rock standing Dynamic functional reach with left and right UE's to rplace and remove graded clothespins from targets 1-8#, lighter resistance used with RUE due to weakness. UBE x 5 mins level 1 for conditioning.    PATIENT EDUCATION: Education details:    red putty HEP Person educated: Patient,  Education method: Explanation demonstration, v.c, handout Education comprehension: verbalized understanding, returned demonstration, v.c  HOME EXERCISE PROGRAM: PWR! hands, beginning coordination-08/22/23  GOALS: Goals reviewed with patient? Yes  SHORT TERM GOALS: Target date: 12/03/23   I with PD specific HEP  Goal status: met 11/09/23  2.  Pt will demonstrate  understanding of adapted strategies/ AE to increase safety and I with ADLs/IADLs. Goal status: ongoing therapist has initated, pt can benefit from continued reinforcement 11/03/23  3.  Pt will demonstrate improved bilateral UE use as evidenced by increasing box and blocks score by 3 blocks bilaterally.   Goal status: met-RUE 40 blocks,  LUE 39 blocks, goal 09/14/23 (RUE 31 blocks and LUE 34 blocks on 11/03/23,  4.  Pt will demonstrate improved bilateral UE functional use and ease with dressing as evidenced by decreasing 3 button/ unbutton test time by 5 secs.  Goal status: not met,  >3 mins, 10/03/23 goal deferred 11/03/23  5.  Pt will demonstrate ability to retrieve a lightweight object at 105 shoulder flexion and -30 elbow extension with RUE.  Goal status:  met, 125, -10 10/03/23  6.  Pt will demonstrate ability to retrieve a lightweight object at 110 shoulder flexion and -20 elbow extension with LUE  Goal status:met, 125, -20 10/03/23  LONG TERM GOALS: Target date: 01/26/24- may d/c sooner   Pt will demonstrate improved fine motor coordination for ADLs as evidenced by decreasing 9 hole peg test score for RUE by 4 secs Revised goal: Pt will place 9 pegs in 2 mins or less.   Goal status:  ongoing-RUE 4 pegs placed in 2 mins 11/03/23  2.  Pt will demonstrate improved fine motor coordination for ADLs as evidenced by decreasing 9 hole peg test score for LUE by 4 secs  Goal status: ongoing, LUE 50.90 secs 11/03/23   3..  Pt will perfrom PPT#4 in 1 min15 secs or less without assist or LOB.  Baseline: 1 min 23 secs with min A- 09/14/23          Goal status;  ongoing 11/03/23   4.  Pt will verbalize understanding of ways to prevent future PD related complications  Goal status: ongoing, need reinforcement. 11/03/23  5.  Pt will write a short paragraph with 100% legibility and minimal decrease in letter size Revised goal: Pt will write his name legibly  Goal status: ongoing, increased difficulty with RUE  use since botox , unable to write legibily 10/25/23  6. Pt will perform PPT#2 in 15 secs or less Baseline: 17.02 on inital eval  Goal status: ongoing-16.15 11/02/23   7. Pt will demonstrate improved RUE grip strength for increased functional use as evidenced by increasing RUE grip strength to 25# Baseline: RUE 25#, LUE 55#- 11/23/23  Goal status: new   ASSESSMENT:  CLINICAL IMPRESSION: Pt. is progressing towards goals. He demosntrates understanding of red putty HEP. He may benefit from reinforcement. Performance deficits:ADLs, IADLs, coordination, dexterity, tone, ROM, strength, flexibility, Fine motor control, Gross motor control, mobility, balance, endurance, decreased knowledge of precautions, decreased knowledge of use of DME, vision, and UE functional use, cognitive skills including attention, memory, problem solving, safety awareness, sequencing, and thought, and psychosocial skills including coping strategies, environmental adaptation, habits, interpersonal interactions, and routines and behaviors.   IMPAIRMENTS: are limiting patient from ADLs, IADLs, rest and sleep, play, leisure, and social participation.   COMORBIDITIES:  may have co-morbidities  that affects occupational performance. Patient will benefit from skilled OT to address above impairments and improve overall function.  MODIFICATION OR ASSISTANCE TO COMPLETE EVALUATION: Min-Moderate modification of tasks or assist with assess necessary to complete an evaluation.  OT OCCUPATIONAL PROFILE AND HISTORY: Detailed assessment: Review of records and additional review of physical, cognitive, psychosocial history related to current functional performance.  CLINICAL DECISION MAKING: Moderate - several treatment options, min-mod task modification necessary  REHAB POTENTIAL: Good  EVALUATION COMPLEXITY: Low    PLAN:  OT FREQUENCY: 2x/week  OT DURATION: 12 weeks  PLANNED INTERVENTIONS: 97168 OT Re-evaluation, 97535 self  care/ADL training, 02889 therapeutic exercise, 97530 therapeutic activity, 97112 neuromuscular re-education, 97140 manual therapy, 97116 gait training, 02886 aquatic therapy, 97035 ultrasound, 97018 paraffin, 02989 moist heat, 97010 cryotherapy, 97034 contrast bath, 97129 Cognitive training (first 15 min), 02869 Cognitive training(each additional 15 min), passive range of motion, balance training, functional mobility training, visual/perceptual remediation/compensation, energy conservation, coping strategies training, patient/family education, and DME and/or AE instructions  RECOMMENDED OTHER SERVICES: n/a  CONSULTED AND AGREED WITH PLAN OF CARE: Patient  PLAN FOR NEXT SESSION: continue to work towards goals, reinforce donning / doffing jacket, coordination/ functional use of RUE. [employer], OT 11/23/2023, 3:22 PM

## 2023-11-23 NOTE — Patient Instructions (Signed)
 1. Grip Strengthening (Resistive Putty)   Squeeze putty using thumb and all fingers. Repeat _20___ times. Do __21-2_ sessions per day.   Finger / Thumb Activities: Extension    Roll putty into rope shape using all fingers held straight. Hitchhike with thumb up and out.  Copyright  VHI. All rights reserved.    Pinch: Palmar    Pinch putty with right thumb and each fingertip in turn. Repeat _10-20___ times. Do __1__ sessions per day. Activity: Peel fruit such as lemons or oranges.* Peel stickers off surfaces.  Copyright  VHI. All rights reserved.     Copyright  VHI. All rights reserved.

## 2023-11-28 ENCOUNTER — Encounter: Payer: Self-pay | Admitting: Occupational Therapy

## 2023-11-28 ENCOUNTER — Ambulatory Visit

## 2023-11-28 ENCOUNTER — Ambulatory Visit: Admitting: Occupational Therapy

## 2023-11-28 DIAGNOSIS — R278 Other lack of coordination: Secondary | ICD-10-CM

## 2023-11-28 DIAGNOSIS — R2689 Other abnormalities of gait and mobility: Secondary | ICD-10-CM

## 2023-11-28 DIAGNOSIS — R29818 Other symptoms and signs involving the nervous system: Secondary | ICD-10-CM

## 2023-11-28 DIAGNOSIS — G20B2 Parkinson's disease with dyskinesia, with fluctuations: Secondary | ICD-10-CM

## 2023-11-28 DIAGNOSIS — R262 Difficulty in walking, not elsewhere classified: Secondary | ICD-10-CM

## 2023-11-28 DIAGNOSIS — R41844 Frontal lobe and executive function deficit: Secondary | ICD-10-CM

## 2023-11-28 DIAGNOSIS — R41840 Attention and concentration deficit: Secondary | ICD-10-CM

## 2023-11-28 NOTE — Therapy (Signed)
 OUTPATIENT OCCUPATIONAL THERAPY [facility] treatment  Patient Name: Patrick Ross MRN: [id number] DOB:Jan 03, 1946, 78 y.o., male Today's Date: 11/28/2023  PCP: Dr. Millicent REFERRING PROVIDER:Dr. Tat  END OF SESSION:  OT End of Session - 11/28/23 1522     Visit Number 18    Number of Visits 25    Date for Recertification  01/26/24    Authorization Type MCR, [employer]    Authorization Time Period 12 weeks, anticipate d/c after 8 weeks    Authorization - Visit Number 18    Progress Note Due on Visit 20    OT Start Time 1447    OT Stop Time 1525    OT Time Calculation (min) 38 min    Activity Tolerance Patient tolerated treatment well    Behavior During Therapy WFL for tasks assessed/performed                    Past Medical History:  Diagnosis Date   Depression    DVT (deep venous thrombosis) (HCC)    GERD (gastroesophageal reflux disease)    History of kidney stones    passed   Hypertension    Hypothyroidism    Lumbar stenosis    OSA (obstructive sleep apnea)    wears cpap   Parkinson's disease (HCC)    PONV (postoperative nausea and vomiting)    REM behavioral disorder    Urinary incontinence    Past Surgical History:  Procedure Laterality Date   BACK SURGERY     x 2 - not fusion   BIOPSY  06/01/2022   Procedure: BIOPSY;  Surgeon: Dianna Specking, MD;  Location: [facility];  Service: Gastroenterology;;   COLONOSCOPY     DEEP BRAIN STIMULATOR PLACEMENT     left 12/27/2012, right 12/10/2011, left revision 02/02/2013, replacement bilateral 11/21/2015   ESOPHAGOGASTRODUODENOSCOPY (EGD) WITH PROPOFOL  N/A 06/01/2022   Procedure: ESOPHAGOGASTRODUODENOSCOPY (EGD) WITH PROPOFOL ;  Surgeon: Dianna Specking, MD;  Location: [facility];  Service: Gastroenterology;  Laterality: N/A;   MINOR PLACEMENT OF FIDUCIAL N/A 01/09/2019   Procedure: Fiducial placement;  Surgeon: Unice Pac, MD;  Location: [facility] OR;  Service: Neurosurgery;  Laterality: N/A;  Fiducial  placement   PARTIAL NEPHRECTOMY Right 2005 ish   PULSE GENERATOR IMPLANT Right 01/23/2019   Procedure: Right chest implantable pulse generator change;  Surgeon: Unice Pac, MD;  Location: [facility] OR;  Service: Neurosurgery;  Laterality: Right;   SPINAL FUSION     lumbar Fusion x 2   SUBTHALAMIC STIMULATOR BATTERY REPLACEMENT Left 02/02/2018   Procedure: Change implantable pulse generator battery, Left chest;  Surgeon: Unice Pac, MD;  Location: [facility] OR;  Service: Neurosurgery;  Laterality: Left;   SUBTHALAMIC STIMULATOR BATTERY REPLACEMENT N/A 01/22/2020   Procedure: REMOVAL OF LEFT BRAIN DEEP BRAIN STIMULATOR ELECTRODE, EXTENSION, AND PULSE GENERATOR;  Surgeon: Unice Pac, MD;  Location: [facility] OR;  Service: Neurosurgery;  Laterality: N/A;   SUBTHALAMIC STIMULATOR BATTERY REPLACEMENT Right 02/23/2022   Procedure: CHANGE IPG BATTERY, RT CHEST;  Surgeon: Dawley, Lani JAYSON, DO;  Location: [facility] OR;  Service: Neurosurgery;  Laterality: Right;   SUBTHALAMIC STIMULATOR INSERTION Left 01/16/2019   Procedure: Left deep brain stimulator electrode revision.;  Surgeon: Unice Pac, MD;  Location: [facility] OR;  Service: Neurosurgery;  Laterality: Left;   SUBTHALAMIC STIMULATOR INSERTION Left 01/23/2019   Procedure: Repositioning of Left Deep Brain Stimulator Electrode;  Surgeon: Unice Pac, MD;  Location: [facility] OR;  Service: Neurosurgery;  Laterality: Left;   TONSILLECTOMY     Patient Active Problem List  Diagnosis Date Noted   Melena 06/01/2022   Acute cystitis without hematuria 05/31/2022   Upper GI bleed 05/31/2022   Generalized weakness 05/30/2022   Abdominal pain 05/30/2022   OSA (obstructive sleep apnea) 05/30/2022   Hypothyroidism 05/30/2022   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) 12/14/2021   Mixed hyperlipidemia 12/14/2021   Essential hypertension 12/14/2021   Palpitations 12/14/2021   Angina pectoris 12/14/2021   Urge incontinence 01/15/2021   History of UTI 01/15/2021    Abnormal urine findings 01/15/2021   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   History of nephrolithiasis 01/15/2021   DVT, lower extremity, proximal, acute, right (HCC) 02/26/2020   Open scalp wound 01/22/2020   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HCC)    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   Parkinson's disease (HCC) 01/16/2018   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) 01/16/2018   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    ONSET DATE: 07/28/23- referral date  REFERRING DIAG:  Diagnosis  Z74.1 (ICD-10-CM) - Requires assistance with activities of daily living (ADL)  G20.B2 (ICD-10-CM) - Parkinson's disease with dyskinesia and fluctuating manifestations (HCC)    THERAPY DIAG:  Other lack of coordination  Attention and concentration deficit  Other symptoms and signs involving the nervous system  Frontal lobe and executive function deficit  Other abnormalities of gait and mobility  Rationale for Evaluation and Treatment: Rehabilitation  SUBJECTIVE:   SUBJECTIVE STATEMENT: Pt reports he had a fall  Pt accompanied by: self  PERTINENT HISTORY: parkinson's diagnosed greater than 10 yrs ago, deep brain stimulator R, had in L as well but had to be removed for various reasons. Depression, sleep apnea, hx of back surgery, see hx above walks around neighborhood 3 x week with aide Tuesdays and Thursdays parkisons bicycle class 2 x Drue training for upper body work out   PRECAUTIONS: Fall- PT is addressing  WEIGHT BEARING RESTRICTIONS: No  PAIN:  Are you having pain? Yes: NPRS scale: 4/10,  Pain location: back, hip Pain description: aching Aggravating factors: malposiitoning Relieving factors: rest,   FALLS: Has patient fallen in last 6 months? No  LIVING ENVIRONMENT: Lives with: lives with their spouse, has CNA 3x weeks Lives in: House/apartment Stairs: Yes: Internal: has stairt lift, and coverted garage appt on ground level Has following equipment at home: U-step  PLOF: Needs assistance  with ADLs and Needs assistance with homemaking  PATIENT GOALS: Pt wants to be able to tie shoes, and more I with dressing  OBJECTIVE:  Note: Objective measures were completed at Evaluation unless otherwise noted.  HAND DOMINANCE: Right  ADLs: Overall ADLs: increased time required  Transfers/ambulation related to ADLs: Eating: difficulty cutting food, needs assist  Grooming: mod I with brushing teeth, combing, CNA assists with shaving UB Dressing: mod I LB Dressing: mod-max A for LB dressing Toileting: mod I with U-step Bathing: supervision-walk in shower, level has built in seat and grab bars Tub Shower transfers: min A Equipment: Grab bars and level walk in shower with built in bench  IADLs:Dependent with home management  Medication management: wife assists Financial management: wife handles Handwriting: Moderate micrographia at sentence level  MOBILITY STATUS: mod I with U-step  POSTURE COMMENTS:  rounded shoulders and forward head   FUNCTIONAL OUTCOME MEASURES: Fastening/unfastening 3 buttons: 1 min 43 secs   COORDINATION: 9 Hole Peg test: Right: 55.08 sec; Left: 49.17 sec Box and Blocks:  Right 26blocks, Left 34blocks  UE ROM:  A/ROM:shoulder flexion RUE-100 LUE105, elbow extension RUE-35 LUE -25, decreased bilateral supination, RUE 95% finger extension, maintains  fingers flexed    SENSATION: WFL    COGNITION: Overall cognitive status: Impaired, slowed processing and short term memory deficits  OBSERVATIONS: Bradykinesia and resting tremor RUE, dystonia RUE                                                                                                                    TREATMENT DATE:11/28/23- Pt arrived wearing a pre-fab wrist brace. He reports a fall at home. Pt saw PCP and they put him the brace and he reports that inital x-ray did not show a fx. Pt is scheduled to see ortho next  Tuesday. Pt was encouraged to limit heavy weightbearing through RUE as  able. Therapsit discussed fall reduction strategies with pt.(ie: avoiding crossing feet) UBE x 6 mins level 1 for conditioning wearing wrist brace Seated pt practice donning/ doffing jacket with min -mod v.c and min A. Standing dynamic PWR! rock then [employer]! step, min v.c for amplitude. Simulated eating with foam grip, scooping with cues to turn spoon to come straight into mouth, min v.c and demonstration.   11/23/22 seated PWR! up, then bag exerises for simulated ADLs. simulated donning shirt then cumpling the bag in each hand with left and right hands, mod/ max difficulty with RUE. Pt pracetice donning/ doffing jacket in seated and standing. Pt required minA for safety in standing seated, min A initaly, and min v.c Pt demonstrates improved safety and will benefit from practice. Simulated eating using foam grip on spoon, pt with improved performance. Pt was issued red putty for sustained grip and pinch due to significnat weakness from botox . Pt was instructed that if his spasticiy, dystonia returns to stop using the putty. 11/14/23-modified quadraped rock at [employer], then [employer]! rock in standing, at countertop, Dynamic step and reach to each side to flip playing cards min-mod v.c for amplitude Marching at countertop, min v.c PWR! hands basic 4, min-mod v.c and demonstration. Handwriting activities using a variety of pens and grips. Pt trialed foam grip however he perfromed better with felt tip pen. mod v.c and demonstration for printing large. Pt was able to print his name legibily with practice using felt  tip pen and increased time.  11/09/23-modified quadraped rock at [employer], then [employer]! rock in standing, at countertop, mod v.c and demonstration. Reviewed PWR hands and coordination HEP and re-ssued so pt can practice with his hired caregiver.  11/03/23- reveiwed deep breathing strategies, min-mod v.c and demonstration  PWR! up in seated x 10 reps  modified quadraped rock at [employer], then  [employer]! rock in standing, at countertop, mod v.c and demonstration.  PWR! hands for PWR! up, twist Therapist checked progress towards goals and discussed plans for renewal with pt/ wife. see goals below for progress.  Box/  blocks R 31, L 34 9 hole peg test: R 4 pegs in 2 mins, L 50.90 secs PPT#2 :16.15 secs unable to write name legibily    PATIENT EDUCATION: Education details:    red putty HEP Person educated: Patient,  Education method:  Explanation demonstration, v.c, handout Education comprehension: verbalized understanding, returned demonstration, v.c  HOME EXERCISE PROGRAM: PWR! hands, beginning coordination-08/22/23  GOALS: Goals reviewed with patient? Yes  SHORT TERM GOALS: Target date: 12/03/23   I with PD specific HEP  Goal status: met 11/09/23  2.  Pt will demonstrate understanding of adapted strategies/ AE to increase safety and I with ADLs/IADLs. Goal status: ongoing therapist has initated, pt can benefit from continued reinforcement 11/03/23  3.  Pt will demonstrate improved bilateral UE use as evidenced by increasing box and blocks score by 3 blocks bilaterally.   Goal status: met-RUE 40 blocks,  LUE 39 blocks, goal 09/14/23 (RUE 31 blocks and LUE 34 blocks on 11/03/23,  4.  Pt will demonstrate improved bilateral UE functional use and ease with dressing as evidenced by decreasing 3 button/ unbutton test time by 5 secs.  Goal status: not met,  >3 mins, 10/03/23 goal deferred 11/03/23  5.  Pt will demonstrate ability to retrieve a lightweight object at 105 shoulder flexion and -30 elbow extension with RUE.  Goal status:  met, 125, -10 10/03/23  6.  Pt will demonstrate ability to retrieve a lightweight object at 110 shoulder flexion and -20 elbow extension with LUE  Goal status:met, 125, -20 10/03/23  LONG TERM GOALS: Target date: 01/26/24- may d/c sooner   Pt will demonstrate improved fine motor coordination for ADLs as evidenced by decreasing 9 hole peg test score for RUE  by 4 secs Revised goal: Pt will place 9 pegs in 2 mins or less.   Goal status:  ongoing-RUE 4 pegs placed in 2 mins 11/03/23  2.  Pt will demonstrate improved fine motor coordination for ADLs as evidenced by decreasing 9 hole peg test score for LUE by 4 secs  Goal status: ongoing, LUE 50.90 secs 11/03/23   3..  Pt will perfrom PPT#4 in 1 min15 secs or less without assist or LOB.  Baseline: 1 min 23 secs with min A- 09/14/23          Goal status;  ongoing 11/03/23   4.  Pt will verbalize understanding of ways to prevent future PD related complications  Goal status: ongoing, need reinforcement. 11/03/23  5.  Pt will write a short paragraph with 100% legibility and minimal decrease in letter size Revised goal: Pt will write his name legibly  Goal status: ongoing, increased difficulty with RUE use since botox , unable to write legibily 10/25/23  6. Pt will perform PPT#2 in 15 secs or less Baseline: 17.02 on inital eval  Goal status: ongoing-16.15 11/02/23   7. Pt will demonstrate improved RUE grip strength for increased functional use as evidenced by increasing RUE grip strength to 25# Baseline: RUE 25#, LUE 55#- 11/23/23  Goal status: new   ASSESSMENT:  CLINICAL IMPRESSION: Pt. is progressing towards goals. He demonstrates improvements in donning jacket in seated with practice.Pt sees ortho MD on Tues regarding his RUE. Performance deficits:ADLs, IADLs, coordination, dexterity, tone, ROM, strength, flexibility, Fine motor control, Gross motor control, mobility, balance, endurance, decreased knowledge of precautions, decreased knowledge of use of DME, vision, and UE functional use, cognitive skills including attention, memory, problem solving, safety awareness, sequencing, and thought, and psychosocial skills including coping strategies, environmental adaptation, habits, interpersonal interactions, and routines and behaviors.   IMPAIRMENTS: are limiting patient from ADLs, IADLs, rest and sleep,  play, leisure, and social participation.   COMORBIDITIES:  may have co-morbidities  that affects occupational performance. Patient will benefit from skilled OT to address  above impairments and improve overall function.  MODIFICATION OR ASSISTANCE TO COMPLETE EVALUATION: Min-Moderate modification of tasks or assist with assess necessary to complete an evaluation.  OT OCCUPATIONAL PROFILE AND HISTORY: Detailed assessment: Review of records and additional review of physical, cognitive, psychosocial history related to current functional performance.  CLINICAL DECISION MAKING: Moderate - several treatment options, min-mod task modification necessary  REHAB POTENTIAL: Good  EVALUATION COMPLEXITY: Low    PLAN:  OT FREQUENCY: 2x/week  OT DURATION: 12 weeks  PLANNED INTERVENTIONS: 97168 OT Re-evaluation, 97535 self care/ADL training, 02889 therapeutic exercise, 97530 therapeutic activity, 97112 neuromuscular re-education, 97140 manual therapy, 97116 gait training, 02886 aquatic therapy, 97035 ultrasound, 97018 paraffin, 02989 moist heat, 97010 cryotherapy, 97034 contrast bath, 97129 Cognitive training (first 15 min), 02869 Cognitive training(each additional 15 min), passive range of motion, balance training, functional mobility training, visual/perceptual remediation/compensation, energy conservation, coping strategies training, patient/family education, and DME and/or AE instructions  RECOMMENDED OTHER SERVICES: n/a  CONSULTED AND AGREED WITH PLAN OF CARE: Patient  PLAN FOR NEXT SESSION: continue to work towards [employer] and injury, coordination/ functional use of RUE. Jermel Artley, OT 11/28/2023, 3:23 PM

## 2023-11-28 NOTE — Therapy (Signed)
 OUTPATIENT PHYSICAL THERAPY NEURO TREATMENT    Patient Name: Patrick Ross MRN: [id number] DOB:22-Jan-1946, 78 y.o., male Today's Date: 11/28/2023   PCP: Millicent Sharper MD REFERRING PROVIDER: Evonnie Asberry RAMAN DO  END OF SESSION:  PT End of Session - 11/28/23 1516     Visit Number 26    Date for Recertification  12/03/23    Authorization Type Medicare    Progress Note Due on Visit 30    PT Start Time 0330    PT Stop Time 0415    PT Time Calculation (min) 45 min    Activity Tolerance Patient limited by fatigue    Behavior During Therapy WFL for tasks assessed/performed                        Past Medical History:  Diagnosis Date   Depression    DVT (deep venous thrombosis) (HCC)    GERD (gastroesophageal reflux disease)    History of kidney stones    passed   Hypertension    Hypothyroidism    Lumbar stenosis    OSA (obstructive sleep apnea)    wears cpap   Parkinson's disease (HCC)    PONV (postoperative nausea and vomiting)    REM behavioral disorder    Urinary incontinence    Past Surgical History:  Procedure Laterality Date   BACK SURGERY     x 2 - not fusion   BIOPSY  06/01/2022   Procedure: BIOPSY;  Surgeon: Dianna Specking, MD;  Location: [facility];  Service: Gastroenterology;;   COLONOSCOPY     DEEP BRAIN STIMULATOR PLACEMENT     left 12/27/2012, right 12/10/2011, left revision 02/02/2013, replacement bilateral 11/21/2015   ESOPHAGOGASTRODUODENOSCOPY (EGD) WITH PROPOFOL  N/A 06/01/2022   Procedure: ESOPHAGOGASTRODUODENOSCOPY (EGD) WITH PROPOFOL ;  Surgeon: Dianna Specking, MD;  Location: [facility];  Service: Gastroenterology;  Laterality: N/A;   MINOR PLACEMENT OF FIDUCIAL N/A 01/09/2019   Procedure: Fiducial placement;  Surgeon: Unice Pac, MD;  Location: [facility] OR;  Service: Neurosurgery;  Laterality: N/A;  Fiducial placement   PARTIAL NEPHRECTOMY Right 2005 ish   PULSE GENERATOR IMPLANT Right 01/23/2019   Procedure: Right chest  implantable pulse generator change;  Surgeon: Unice Pac, MD;  Location: [facility] OR;  Service: Neurosurgery;  Laterality: Right;   SPINAL FUSION     lumbar Fusion x 2   SUBTHALAMIC STIMULATOR BATTERY REPLACEMENT Left 02/02/2018   Procedure: Change implantable pulse generator battery, Left chest;  Surgeon: Unice Pac, MD;  Location: [facility] OR;  Service: Neurosurgery;  Laterality: Left;   SUBTHALAMIC STIMULATOR BATTERY REPLACEMENT N/A 01/22/2020   Procedure: REMOVAL OF LEFT BRAIN DEEP BRAIN STIMULATOR ELECTRODE, EXTENSION, AND PULSE GENERATOR;  Surgeon: Unice Pac, MD;  Location: [facility] OR;  Service: Neurosurgery;  Laterality: N/A;   SUBTHALAMIC STIMULATOR BATTERY REPLACEMENT Right 02/23/2022   Procedure: CHANGE IPG BATTERY, RT CHEST;  Surgeon: Dawley, Lani JAYSON, DO;  Location: [facility] OR;  Service: Neurosurgery;  Laterality: Right;   SUBTHALAMIC STIMULATOR INSERTION Left 01/16/2019   Procedure: Left deep brain stimulator electrode revision.;  Surgeon: Unice Pac, MD;  Location: [facility] OR;  Service: Neurosurgery;  Laterality: Left;   SUBTHALAMIC STIMULATOR INSERTION Left 01/23/2019   Procedure: Repositioning of Left Deep Brain Stimulator Electrode;  Surgeon: Unice Pac, MD;  Location: [facility] OR;  Service: Neurosurgery;  Laterality: Left;   TONSILLECTOMY     Patient Active Problem List   Diagnosis Date Noted   Melena 06/01/2022   Acute cystitis without hematuria 05/31/2022  Upper GI bleed 05/31/2022   Generalized weakness 05/30/2022   Abdominal pain 05/30/2022   OSA (obstructive sleep apnea) 05/30/2022   Hypothyroidism 05/30/2022   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) 12/14/2021   Mixed hyperlipidemia 12/14/2021   Essential hypertension 12/14/2021   Palpitations 12/14/2021   Angina pectoris 12/14/2021   Urge incontinence 01/15/2021   History of UTI 01/15/2021   Abnormal urine findings 01/15/2021   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   History of nephrolithiasis 01/15/2021   DVT,  lower extremity, proximal, acute, right (HCC) 02/26/2020   Open scalp wound 01/22/2020   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HCC)    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   Parkinson's disease (HCC) 01/16/2018   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) 01/16/2018   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    ONSET DATE: exacerbation 06/21/23, Parkinson's diagnosis 2010  REFERRING DIAG: parkinsons disease with fluctuating dyskinesia  THERAPY DIAG:  Other lack of coordination  Attention and concentration deficit  Difficulty in walking, not elsewhere classified  Parkinson's disease with dyskinesia, with fluctuations (HCC)  Other symptoms and signs involving the nervous system  Other abnormalities of gait and mobility  Rationale for Evaluation and Treatment: Rehabilitation  SUBJECTIVE:                                                                                                                                                                                             SUBJECTIVE STATEMENT:  I don't really remember how I fell but maybe my feet got crossed. I knocked a hole in the wall. My side kind of hurts around the ribs.    EVAL-Pt and his wife report long history of parkinson's,  biggest difficulty with his mobility that they note is R lower back  pain and labored supine to sit movement, also poor foot clearance.  He doesn't like relying on the walker Pt accompanied by: significant other  PERTINENT HISTORY: parkinson's diagnosed greater than 10 yrs ago, deep brain stimulator R, had in L as well but had to be removed for various reasons.  walks around neighborhood 3 x week with aide Tuesdays and Thursdays parkisons bicycle class 2 x Drue training for upper body work out Referred to PT and ST by neurologist   PAIN:  Are you having pain? R hip  3-4/10, constant , getting OOB in the morning makes it worse, nothing makes it better  PRECAUTIONS: Fall and Other: deep brain stimulator on R   RED  FLAGS: None   WEIGHT BEARING RESTRICTIONS: No  FALLS: Has patient fallen in last 6 months? No  LIVING ENVIRONMENT: Lives with: lives with their spouse Lives in: House/apartment  Stairs: has internal steps, but has had home renovated and adapted so the he and his wife live in downstairs apartment, all one level with bathroom and kitchen Has following equipment at home: Single point cane, Walker - 4 wheeled, Shower bench, Grab bars, and Ramped entry  PLOF: Independent  PATIENT GOALS: improve bed mobility and improve gait, stability, avoid falls  OBJECTIVE:  Note: Objective measures were completed at Evaluation unless otherwise noted.  DIAGNOSTIC FINDINGS: see neuro notes, has deep brain stimulator R   COGNITION: Overall cognitive status: Within functional limits for tasks assessed   SENSATION: WFL  COORDINATION: Slowed movements B and noted curling R hand and R foot at times, with gait festinating on turns   EDEMA:  None noted  MUSCLE TONE: noted curling R fingers, toes, knee flex and hip flex at times, randomly during evaluation  MUSCLE LENGTH: Hamstrings: Right -30 deg; Left -30 deg Thomas test: Right nt deg; Left nt deg    POSTURE: Standing with some forward lean, weight on forefeet, L calf atrophied pt and wife report chronic after L lumbar surgery.     LOWER EXTREMITY ROM:   grossly wfl, hips not assessed as not positioned on mat today   LOWER EXTREMITY MMT:    MMT Right Eval Left Eval  Hip flexion 4 4-  Hip extension    Hip abduction    Hip adduction    Hip internal rotation    Hip external rotation    Knee flexion 4 4  Knee extension 4 4-  Ankle dorsiflexion 4 3+  Ankle plantarflexion 4- 3-  Ankle inversion    Ankle eversion    (Blank rows = not tested)  BED MOBILITY:  Not tested  TRANSFERS: I but slowed, uses hands on seat or with rollator  GAIT: Findings: Gait Characteristics: step to pattern, decreased step length- Right, decreased  step length- Left, decreased stride length, decreased hip/knee flexion- Right, decreased hip/knee flexion- Left, shuffling, festinating, trunk flexed, and narrow BOS, Distance walked: 100', Assistive device utilized:Walker - 4 wheeled, Level of assistance: SBA, and Comments: festinating noted on turns, tends to turn L  FUNCTIONAL TESTS:  30 seconds chair stand test 6 reps Timed up and go (TUG): 33.39 with rollator Standing static feet together 15 sec Standing semi tandem 10 sec each leg, needed CGA with L foot forward Standing marching, required B hand hold support to maintain balance                                                                                                                              TREATMENT DATE: 11/28/23 NuStep L5x46mins  Standing stepping to target  Marching to high target Modified sit up holding yellow ball 2x10 Rotations with ball OHP with ball 2x5   11/23/23:  Nustep L 5 x Standing in front of hi low table, for side to side swings with 2# medibar Sit to stand with red mediball forward punches Standing  high marches to 10' target, holding rollator  Standing modified RDL's semi tandem standing, reaches with 4# wt in B hands to rollator seat Supine with 85cm physioball under feet, B knee to chest, bridging, alt SLR Gait for/ back length of mat table, 4 x, cues for larger steps   11/17/23 NuStep L5 x 7 min Sit to stand elevated surface OHP w/ yellow ball x8, x6  heavy focus on control and form Side step at mat able  Standing march 2lb cuff 2x10 each w/ rolator  X10 without rolator  Seated shoulder Flex w/ 3lb WaTE  Walking 2 laps- cues to take big steps     11/03/23 Gait with rollator outside with CGA, negotiated curbs a few times, at times shuffles significantly TUG noted below STS as noted below HHA walking 100 feet Direction changes Supine feet on ball K2C, rotation, bridge and abs Bed mobility to and from supine to sitting very difficult  for him due to right low back pain   10/27/23  Seated PWR up with 2# Wate bar- reaching forward to tap bolster then PWR up x8 Seated PWR reach x6 B  PWR step + twist x8 B STS from high mat table + yellow ball chest press x10   Nustep L5x8 minutes BLEs only  Walking forward and backward with U-walker, cues for big steps   10/19/23 STS with chest press 3# WaTE 2x10 Punches with 4# standing Modified sit up with yellow ball 2x10  NuStep L5 x7mins  Leg ext 10# 2x10 HS curls 25# 2x10 Ball catch Walking 2 laps- cues to take big steps    10/17/23 Recheck goals for PN Standing reaching yellow weighted ball at 4 targets on wall- had to sit after 3 reps due to pt feeling weak esp in his R arm Standing march with walker  NuStep L5 x11mins- mostly legs  LAQ 5# ankle weights      PATIENT EDUCATION: Education details: POC, goals Person educated: Patient and Spouse Education method: Explanation, Demonstration, Tactile cues, and Verbal cues Education comprehension: verbalized understanding, verbal cues required, and needs further education  HOME EXERCISE PROGRAM: Access Code: [email or web address] URL: [email or web address] Date: 08/02/2023 Prepared by: Almetta Fam  Exercises - Supine Hamstring Stretch with Strap  - 2 x daily - 7 x weekly - 2 reps - 15 hold - Modified Thomas Stretch  - 2 x daily - 7 x weekly - 2 reps - 15 hold - Supine Bridge  - 1 x daily - 7 x weekly - 2 sets - 10 reps - Standing Shoulder Horizontal Abduction with Resistance  - 1 x daily - 7 x weekly - 2 sets - 10 reps  GOALS: Goals reviewed with patient? Yes  SHORT TERM GOALS: Target date: 4 weeks, 08/03/23  I home program for posture and for hamstring, hip flexor stretchig Baseline: Goal status: ongoing 08/02/23, ongoing 08/23/23, MET 10/12/23  LONG TERM GOALS: Target date: 10/31/23  30 sec sit to stand improve from 6 reps to 12 reps for improved coordination, strength, endurance LEs Baseline:  Goal status: 7  reps ongoing 08/04/23, 8 reps 08/23/23, 11 reps 09/26/23, 10 reps 10/17/23, 9 reps 11/03/23  2.  I and pain free supine to sit motion , rolling to R Baseline: very painful per pt and wife history, not specifically assessed in clinic today Goal status: ongoing 07/28/23, ongoing 08/09/23, still has pain with bed mobility 08/23/23, can do but still painful 09/26/23, does not hurt as bad 10/12/23, still hurting with this  11/03/23  3.  TUG score improve from 33 sec to 18 or less for reduced fall risk   Baseline:  Goal status: 27s ongoing 08/04/23, 22s 08/23/23, 20s 09/26/23, 20s 10/17/23, 11/03/23 20 seconds, difficulty with the turns  4.  Improve strength LE's for B quads, hamstrings to 4+5 for improved functional strength Baseline: 4- Goal status: 4/5 08/04/23, 5/5 MET 08/23/23   ASSESSMENT:  CLINICAL IMPRESSION:  Patient more unsteady today than in previous visits with freezing gait frequently. He had a fall on Thursday. He is wearing a wrist brace today and will see ortho tomorrow to confirm whether or not it is broken. Does well with interventions when he has a target. Able to do activities with yellow ball today with no pain in wrist. Will continue to address and progress his routine as appropriate.   EVAL Patient is a 78 y.o. male who was evaluated today by physical therapy due to referral from his neurologist, with decline in function, particularly control of R extremities, with his Parkinson's diagnosis.  He and his wife have been proactive about maintaining his function, he attends a training/ strengthening session locally 2 x week , attends parkinson's cycling weekly, and walks around 0.6 miles with his aide 3 x week outdoors with his rollator.  They recently have adapted their home with a main floor renovation so that their bedroom, bath, kitchen are all on one level.   Main problems identified today are weakness B LE's, L ankle is weak chronically due to lumbar radiculopathy and B knee, hips musculature is  weak.  He also has coordination deficits with festinating gait particularly on turns and static / dynamic balance deficits.  Has chronic R sided lower back pain which is particularly intense with moving supine to sit.  He should benefit from physical therapy intervention to address his deficits and update his current routine for stretching and strengthening at home.    OBJECTIVE IMPAIRMENTS: Abnormal gait, decreased activity tolerance, decreased balance, decreased coordination, decreased endurance, decreased mobility, difficulty walking, decreased strength, hypomobility, impaired perceived functional ability, impaired UE functional use, postural dysfunction, and pain.   ACTIVITY LIMITATIONS: carrying, lifting, squatting, stairs, transfers, and locomotion level  PARTICIPATION LIMITATIONS: meal prep, cleaning, laundry, shopping, community activity, and church  PERSONAL FACTORS: Age, Behavior pattern, Fitness, Past/current experiences, Time since onset of injury/illness/exacerbation, and 1-2 comorbidities: h/o L lumbar radiculopathy and weakness L LE, chronic R sided lower back pain, are also affecting patient's functional outcome.   REHAB POTENTIAL: Good  CLINICAL DECISION MAKING: Evolving/moderate complexity  EVALUATION COMPLEXITY: Moderate  PLAN:  PT FREQUENCY: 2x/week  PT DURATION: 12 weeks  PLANNED INTERVENTIONS: 97110-Therapeutic exercises, 97530- Therapeutic activity, 97112- Neuromuscular re-education, 97535- Self Care, and [id number]- Manual therapy  PLAN FOR NEXT SESSION: continue with balance, stretching, coordination activities, PWR based activities , renewal performed  Greig Credit, PT, DPT, OCS 11/28/23 4:11 PM

## 2023-11-29 ENCOUNTER — Encounter: Payer: Self-pay | Admitting: Physical Medicine & Rehabilitation

## 2023-11-29 ENCOUNTER — Encounter: Attending: Physical Medicine & Rehabilitation | Admitting: Physical Medicine & Rehabilitation

## 2023-11-29 DIAGNOSIS — G248 Other dystonia: Secondary | ICD-10-CM | POA: Insufficient documentation

## 2023-11-29 NOTE — Progress Notes (Addendum)
 Subjective:    Patient ID: Patrick Ross, male    DOB: 1945-05-30, 78 y.o.   MRN: [id number]  HPI 78 year old male with advanced Parkinson's disease using a rolling walker for ambulation.  He has had a recent fall resulting in a right wrist injury.  X-ray showed possible dorsal risk fracture only on 1 view.  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  He had a good relief with this although some grip strength weakness since injection.  We discussed that this will subside over the next several weeks and get back to normal.  We discussed the option of repeating injection at a lower dose. No other postinjection complications.  Pain Inventory Average Pain 4 Pain Right Now 7 My pain is intermittent, constant, stabbing, and aching  In the last 24 hours, has pain interfered with the following? General activity 5 Relation with others 0 Enjoyment of life 3 What TIME of day is your pain at its worst? morning , daytime, evening, and night Sleep (in general) Good  Pain is worse with: walking, bending, standing, and some activites Pain improves with: medication Relief from Meds: 7  Family History  Problem Relation Age of Onset   AAA (abdominal aortic aneurysm) Mother        52 yo   Heart disease Father    Diabetes Father    Diabetes Brother    Healthy Child    Social History   Socioeconomic History   Marital status: Married    Spouse name: Not on file   Number of children: 3   Years of education: Not on file   Highest education level: Master's degree (e.g., MA, MS, MEng, MEd, MSW, MBA)  Occupational History   Not on file  Tobacco Use   Smoking status: Never   Smokeless tobacco: Never  Vaping Use   Vaping status: Never Used  Substance and Sexual Activity   Alcohol use: Not Currently   Drug use:  Never   Sexual activity: Not on file  Other Topics Concern   Not on file  Social History Narrative   Right handed   2 story home    Lives with spouse    Social Drivers of Health   Financial Resource Strain: Not on file  Food Insecurity: Low Risk  (07/03/2022)   Received from [facility]   Hunger Vital Sign    Within the past 12 months, you worried that your food would run out before you got money to buy more: Never true    Within the past 12 months, the food you bought just didn't last and you didn't have money to get more. : Never true  Transportation Needs: No Transportation Needs (07/03/2022)   Received from [facility]    In the past 12 months, has lack of reliable transportation kept you from medical appointments, meetings, work or from getting things needed for daily living? : No  Physical Activity: Not on file  Stress: Not on file  Social Connections: Not on file   Past Surgical History:  Procedure Laterality Date   BACK SURGERY     x 2 - not fusion   BIOPSY  06/01/2022   Procedure: BIOPSY;  Surgeon: Dianna Specking, MD;  Location: [facility];  Service: Gastroenterology;;   COLONOSCOPY     DEEP BRAIN STIMULATOR PLACEMENT  left 12/27/2012, right 12/10/2011, left revision 02/02/2013, replacement bilateral 11/21/2015   ESOPHAGOGASTRODUODENOSCOPY (EGD) WITH PROPOFOL  N/A 06/01/2022   Procedure: ESOPHAGOGASTRODUODENOSCOPY (EGD) WITH PROPOFOL ;  Surgeon: Dianna Specking, MD;  Location: [facility];  Service: Gastroenterology;  Laterality: N/A;   MINOR PLACEMENT OF FIDUCIAL N/A 01/09/2019   Procedure: Fiducial placement;  Surgeon: Unice Pac, MD;  Location: [facility] OR;  Service: Neurosurgery;  Laterality: N/A;  Fiducial placement   PARTIAL NEPHRECTOMY Right 2005 ish   PULSE GENERATOR IMPLANT Right 01/23/2019   Procedure: Right chest implantable pulse generator change;  Surgeon: Unice Pac, MD;  Location: [facility] OR;  Service: Neurosurgery;  Laterality: Right;    SPINAL FUSION     lumbar Fusion x 2   SUBTHALAMIC STIMULATOR BATTERY REPLACEMENT Left 02/02/2018   Procedure: Change implantable pulse generator battery, Left chest;  Surgeon: Unice Pac, MD;  Location: [facility] OR;  Service: Neurosurgery;  Laterality: Left;   SUBTHALAMIC STIMULATOR BATTERY REPLACEMENT N/A 01/22/2020   Procedure: REMOVAL OF LEFT BRAIN DEEP BRAIN STIMULATOR ELECTRODE, EXTENSION, AND PULSE GENERATOR;  Surgeon: Unice Pac, MD;  Location: [facility] OR;  Service: Neurosurgery;  Laterality: N/A;   SUBTHALAMIC STIMULATOR BATTERY REPLACEMENT Right 02/23/2022   Procedure: CHANGE IPG BATTERY, RT CHEST;  Surgeon: Dawley, Lani BROCKS, DO;  Location: [facility] OR;  Service: Neurosurgery;  Laterality: Right;   SUBTHALAMIC STIMULATOR INSERTION Left 01/16/2019   Procedure: Left deep brain stimulator electrode revision.;  Surgeon: Unice Pac, MD;  Location: [facility] OR;  Service: Neurosurgery;  Laterality: Left;   SUBTHALAMIC STIMULATOR INSERTION Left 01/23/2019   Procedure: Repositioning of Left Deep Brain Stimulator Electrode;  Surgeon: Unice Pac, MD;  Location: [facility] OR;  Service: Neurosurgery;  Laterality: Left;   TONSILLECTOMY     Past Surgical History:  Procedure Laterality Date   BACK SURGERY     x 2 - not fusion   BIOPSY  06/01/2022   Procedure: BIOPSY;  Surgeon: Dianna Specking, MD;  Location: [facility];  Service: Gastroenterology;;   COLONOSCOPY     DEEP BRAIN STIMULATOR PLACEMENT     left 12/27/2012, right 12/10/2011, left revision 02/02/2013, replacement bilateral 11/21/2015   ESOPHAGOGASTRODUODENOSCOPY (EGD) WITH PROPOFOL  N/A 06/01/2022   Procedure: ESOPHAGOGASTRODUODENOSCOPY (EGD) WITH PROPOFOL ;  Surgeon: Dianna Specking, MD;  Location: [facility];  Service: Gastroenterology;  Laterality: N/A;   MINOR PLACEMENT OF FIDUCIAL N/A 01/09/2019   Procedure: Fiducial placement;  Surgeon: Unice Pac, MD;  Location: [facility] OR;  Service: Neurosurgery;  Laterality: N/A;  Fiducial placement   PARTIAL  NEPHRECTOMY Right 2005 ish   PULSE GENERATOR IMPLANT Right 01/23/2019   Procedure: Right chest implantable pulse generator change;  Surgeon: Unice Pac, MD;  Location: [facility] OR;  Service: Neurosurgery;  Laterality: Right;   SPINAL FUSION     lumbar Fusion x 2   SUBTHALAMIC STIMULATOR BATTERY REPLACEMENT Left 02/02/2018   Procedure: Change implantable pulse generator battery, Left chest;  Surgeon: Unice Pac, MD;  Location: [facility] OR;  Service: Neurosurgery;  Laterality: Left;   SUBTHALAMIC STIMULATOR BATTERY REPLACEMENT N/A 01/22/2020   Procedure: REMOVAL OF LEFT BRAIN DEEP BRAIN STIMULATOR ELECTRODE, EXTENSION, AND PULSE GENERATOR;  Surgeon: Unice Pac, MD;  Location: [facility] OR;  Service: Neurosurgery;  Laterality: N/A;   SUBTHALAMIC STIMULATOR BATTERY REPLACEMENT Right 02/23/2022   Procedure: CHANGE IPG BATTERY, RT CHEST;  Surgeon: Dawley, Lani BROCKS, DO;  Location: [facility] OR;  Service: Neurosurgery;  Laterality: Right;   SUBTHALAMIC STIMULATOR INSERTION Left 01/16/2019   Procedure: Left deep brain stimulator electrode revision.;  Surgeon: Unice Pac, MD;  Location: [facility]  OR;  Service: Neurosurgery;  Laterality: Left;   SUBTHALAMIC STIMULATOR INSERTION Left 01/23/2019   Procedure: Repositioning of Left Deep Brain Stimulator Electrode;  Surgeon: Unice Pac, MD;  Location: [facility] OR;  Service: Neurosurgery;  Laterality: Left;   TONSILLECTOMY     Past Medical History:  Diagnosis Date   Depression    DVT (deep venous thrombosis) (HCC)    GERD (gastroesophageal reflux disease)    History of kidney stones    passed   Hypertension    Hypothyroidism    Lumbar stenosis    OSA (obstructive sleep apnea)    wears cpap   Parkinson's disease (HCC)    PONV (postoperative nausea and vomiting)    REM behavioral disorder    Urinary incontinence    There were no vitals taken for this visit.  Opioid Risk Score:   Fall Risk Score:  `1  Depression screen [facility] 2/9     11/29/2023    2:59 PM 09/20/2023    2:52  PM 03/14/2020   11:05 AM 06/05/2018    3:08 PM 03/21/2018   12:46 PM 11/18/2017    2:07 PM 09/20/2017   10:49 AM  Depression screen PHQ 2/9  Decreased Interest 0 0 0 0 0 0 0  Down, Depressed, Hopeless 0 0 0 0 0 0 0  PHQ - 2 Score 0 0 0 0 0 0 0  Altered sleeping  1       Tired, decreased energy  1       Change in appetite  0       Feeling bad or failure about yourself   0       Trouble concentrating  1       Moving slowly or fidgety/restless  0       Suicidal thoughts  0       PHQ-9 Score  3       Difficult doing work/chores  Not difficult at all        4 7  Review of Systems  Eyes:  Positive for photophobia.  Musculoskeletal:  Positive for back pain.  All other systems reviewed and are negative.      Objective:   Physical Exam There is bruising over the distal ulnar styloid Finger flexors tone is MAS 0 Wrist flexor tone MAS 0 Motor strength is 4 - finger flexion and wrist flexion. No evidence of tremor at the wrist or fingers. No evidence of clonus over the wrist and fingers General no acute distress Ambulates with a walker short step length       Assessment & Plan:  #1.  Focal dystonia associated with Parkinson's disease improvement in tone after botulinum toxin injection performed 6 weeks ago.  He has experienced some excessive weakness which can be mitigated at next injection I would like to see him in 6 weeks at which time we can reassess his tone and decide on further treatment.

## 2023-12-05 NOTE — Therapy (Signed)
 OUTPATIENT PHYSICAL THERAPY NEURO TREATMENT    Patient Name: Patrick Ross MRN: [id number] DOB:03/12/1945, 78 y.o., male Today's Date: 12/06/2023   PCP: Millicent Sharper MD REFERRING PROVIDER: Evonnie Asberry RAMAN DO  END OF SESSION:  PT End of Session - 12/06/23 1447     Visit Number 27    Date for Recertification  01/17/24    Authorization Type Medicare    Progress Note Due on Visit 30    PT Start Time 1450    PT Stop Time 1535    PT Time Calculation (min) 45 min    Activity Tolerance Patient limited by fatigue    Behavior During Therapy WFL for tasks assessed/performed                         Past Medical History:  Diagnosis Date   Depression    DVT (deep venous thrombosis) (HCC)    GERD (gastroesophageal reflux disease)    History of kidney stones    passed   Hypertension    Hypothyroidism    Lumbar stenosis    OSA (obstructive sleep apnea)    wears cpap   Parkinson's disease (HCC)    PONV (postoperative nausea and vomiting)    REM behavioral disorder    Urinary incontinence    Past Surgical History:  Procedure Laterality Date   BACK SURGERY     x 2 - not fusion   BIOPSY  06/01/2022   Procedure: BIOPSY;  Surgeon: Dianna Specking, MD;  Location: [facility];  Service: Gastroenterology;;   COLONOSCOPY     DEEP BRAIN STIMULATOR PLACEMENT     left 12/27/2012, right 12/10/2011, left revision 02/02/2013, replacement bilateral 11/21/2015   ESOPHAGOGASTRODUODENOSCOPY (EGD) WITH PROPOFOL  N/A 06/01/2022   Procedure: ESOPHAGOGASTRODUODENOSCOPY (EGD) WITH PROPOFOL ;  Surgeon: Dianna Specking, MD;  Location: [facility];  Service: Gastroenterology;  Laterality: N/A;   MINOR PLACEMENT OF FIDUCIAL N/A 01/09/2019   Procedure: Fiducial placement;  Surgeon: Unice Pac, MD;  Location: [facility] OR;  Service: Neurosurgery;  Laterality: N/A;  Fiducial placement   PARTIAL NEPHRECTOMY Right 2005 ish   PULSE GENERATOR IMPLANT Right 01/23/2019   Procedure: Right chest  implantable pulse generator change;  Surgeon: Unice Pac, MD;  Location: [facility] OR;  Service: Neurosurgery;  Laterality: Right;   SPINAL FUSION     lumbar Fusion x 2   SUBTHALAMIC STIMULATOR BATTERY REPLACEMENT Left 02/02/2018   Procedure: Change implantable pulse generator battery, Left chest;  Surgeon: Unice Pac, MD;  Location: [facility] OR;  Service: Neurosurgery;  Laterality: Left;   SUBTHALAMIC STIMULATOR BATTERY REPLACEMENT N/A 01/22/2020   Procedure: REMOVAL OF LEFT BRAIN DEEP BRAIN STIMULATOR ELECTRODE, EXTENSION, AND PULSE GENERATOR;  Surgeon: Unice Pac, MD;  Location: [facility] OR;  Service: Neurosurgery;  Laterality: N/A;   SUBTHALAMIC STIMULATOR BATTERY REPLACEMENT Right 02/23/2022   Procedure: CHANGE IPG BATTERY, RT CHEST;  Surgeon: Dawley, Lani JAYSON, DO;  Location: [facility] OR;  Service: Neurosurgery;  Laterality: Right;   SUBTHALAMIC STIMULATOR INSERTION Left 01/16/2019   Procedure: Left deep brain stimulator electrode revision.;  Surgeon: Unice Pac, MD;  Location: [facility] OR;  Service: Neurosurgery;  Laterality: Left;   SUBTHALAMIC STIMULATOR INSERTION Left 01/23/2019   Procedure: Repositioning of Left Deep Brain Stimulator Electrode;  Surgeon: Unice Pac, MD;  Location: [facility] OR;  Service: Neurosurgery;  Laterality: Left;   TONSILLECTOMY     Patient Active Problem List   Diagnosis Date Noted   Melena 06/01/2022   Acute cystitis without hematuria 05/31/2022  Upper GI bleed 05/31/2022   Generalized weakness 05/30/2022   Abdominal pain 05/30/2022   OSA (obstructive sleep apnea) 05/30/2022   Hypothyroidism 05/30/2022   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) 12/14/2021   Mixed hyperlipidemia 12/14/2021   Essential hypertension 12/14/2021   Palpitations 12/14/2021   Angina pectoris 12/14/2021   Urge incontinence 01/15/2021   History of UTI 01/15/2021   Abnormal urine findings 01/15/2021   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   History of nephrolithiasis 01/15/2021   DVT,  lower extremity, proximal, acute, right (HCC) 02/26/2020   Open scalp wound 01/22/2020   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HCC)    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   Parkinson's disease (HCC) 01/16/2018   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) 01/16/2018   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    ONSET DATE: exacerbation 06/21/23, Parkinson's diagnosis 2010  REFERRING DIAG: parkinsons disease with fluctuating dyskinesia  THERAPY DIAG:  Other lack of coordination  Other symptoms and signs involving the nervous system  Parkinson's disease with dyskinesia, with fluctuations (HCC)  Other abnormalities of gait and mobility  Difficulty in walking, not elsewhere classified  Rationale for Evaluation and Treatment: Rehabilitation  SUBJECTIVE:                                                                                                                                                                                             SUBJECTIVE STATEMENT:  I don't really remember how I fell but maybe my feet got crossed. I knocked a hole in the wall. My side kind of hurts around the ribs.    EVAL-Pt and his wife report long history of parkinson's,  biggest difficulty with his mobility that they note is R lower back  pain and labored supine to sit movement, also poor foot clearance.  He doesn't like relying on the walker Pt accompanied by: significant other  PERTINENT HISTORY: parkinson's diagnosed greater than 10 yrs ago, deep brain stimulator R, had in L as well but had to be removed for various reasons.  walks around neighborhood 3 x week with aide Tuesdays and Thursdays parkisons bicycle class 2 x Drue training for upper body work out Referred to PT and ST by neurologist   PAIN:  Are you having pain? R hip  3-4/10, constant , getting OOB in the morning makes it worse, nothing makes it better  PRECAUTIONS: Fall and Other: deep brain stimulator on R   RED FLAGS: None   WEIGHT BEARING  RESTRICTIONS: No  FALLS: Has patient fallen in last 6 months? No  LIVING ENVIRONMENT: Lives with: lives with their spouse Lives in: House/apartment Stairs: has internal steps, but  has had home renovated and adapted so the he and his wife live in downstairs apartment, all one level with bathroom and kitchen Has following equipment at home: Single point cane, Walker - 4 wheeled, Shower bench, Grab bars, and Ramped entry  PLOF: Independent  PATIENT GOALS: improve bed mobility and improve gait, stability, avoid falls  OBJECTIVE:  Note: Objective measures were completed at Evaluation unless otherwise noted.  DIAGNOSTIC FINDINGS: see neuro notes, has deep brain stimulator R   COGNITION: Overall cognitive status: Within functional limits for tasks assessed   SENSATION: WFL  COORDINATION: Slowed movements B and noted curling R hand and R foot at times, with gait festinating on turns   EDEMA:  None noted  MUSCLE TONE: noted curling R fingers, toes, knee flex and hip flex at times, randomly during evaluation  MUSCLE LENGTH: Hamstrings: Right -30 deg; Left -30 deg Thomas test: Right nt deg; Left nt deg    POSTURE: Standing with some forward lean, weight on forefeet, L calf atrophied pt and wife report chronic after L lumbar surgery.     LOWER EXTREMITY ROM:   grossly wfl, hips not assessed as not positioned on mat today   LOWER EXTREMITY MMT:    MMT Right Eval Left Eval  Hip flexion 4 4-  Hip extension    Hip abduction    Hip adduction    Hip internal rotation    Hip external rotation    Knee flexion 4 4  Knee extension 4 4-  Ankle dorsiflexion 4 3+  Ankle plantarflexion 4- 3-  Ankle inversion    Ankle eversion    (Blank rows = not tested)  BED MOBILITY:  Not tested  TRANSFERS: I but slowed, uses hands on seat or with rollator  GAIT: Findings: Gait Characteristics: step to pattern, decreased step length- Right, decreased step length- Left, decreased  stride length, decreased hip/knee flexion- Right, decreased hip/knee flexion- Left, shuffling, festinating, trunk flexed, and narrow BOS, Distance walked: 100', Assistive device utilized:Walker - 4 wheeled, Level of assistance: SBA, and Comments: festinating noted on turns, tends to turn L  FUNCTIONAL TESTS:  30 seconds chair stand test 6 reps Timed up and go (TUG): 33.39 with rollator Standing static feet together 15 sec Standing semi tandem 10 sec each leg, needed CGA with L foot forward Standing marching, required B hand hold support to maintain balance                                                                                                                              TREATMENT DATE: 12/06/23 NuStep L5x35mins LE only  3# LAQ 2x10 3# marching  HS curls green 2x10 Seated hip abd green 2x10 Ball toss Seated rotations  Recert   11/28/23 NuStep L5x68mins  Standing stepping to target  Marching to high target Modified sit up holding yellow ball 2x10 Rotations with ball OHP with ball 2x5   11/23/23:  Nustep L 5 x  Standing in front of hi low table, for side to side swings with 2# medibar Sit to stand with red mediball forward punches Standing high marches to 10' target, holding rollator  Standing modified RDL's semi tandem standing, reaches with 4# wt in B hands to rollator seat Supine with 85cm physioball under feet, B knee to chest, bridging, alt SLR Gait for/ back length of mat table, 4 x, cues for larger steps   11/17/23 NuStep L5 x 7 min Sit to stand elevated surface OHP w/ yellow ball x8, x6  heavy focus on control and form Side step at mat able  Standing march 2lb cuff 2x10 each w/ rolator  X10 without rolator  Seated shoulder Flex w/ 3lb WaTE  Walking 2 laps- cues to take big steps     11/03/23 Gait with rollator outside with CGA, negotiated curbs a few times, at times shuffles significantly TUG noted below STS as noted below HHA walking 100  feet Direction changes Supine feet on ball K2C, rotation, bridge and abs Bed mobility to and from supine to sitting very difficult for him due to right low back pain   10/27/23  Seated PWR up with 2# Wate bar- reaching forward to tap bolster then PWR up x8 Seated PWR reach x6 B  PWR step + twist x8 B STS from high mat table + yellow ball chest press x10   Nustep L5x8 minutes BLEs only  Walking forward and backward with U-walker, cues for big steps   10/19/23 STS with chest press 3# WaTE 2x10 Punches with 4# standing Modified sit up with yellow ball 2x10  NuStep L5 x80mins  Leg ext 10# 2x10 HS curls 25# 2x10 Ball catch Walking 2 laps- cues to take big steps    10/17/23 Recheck goals for PN Standing reaching yellow weighted ball at 4 targets on wall- had to sit after 3 reps due to pt feeling weak esp in his R arm Standing march with walker  NuStep L5 x71mins- mostly legs  LAQ 5# ankle weights      PATIENT EDUCATION: Education details: POC, goals Person educated: Patient and Spouse Education method: Explanation, Demonstration, Tactile cues, and Verbal cues Education comprehension: verbalized understanding, verbal cues required, and needs further education  HOME EXERCISE PROGRAM: Access Code: [email or web address] URL: [email or web address] Date: 08/02/2023 Prepared by: Almetta Fam  Exercises - Supine Hamstring Stretch with Strap  - 2 x daily - 7 x weekly - 2 reps - 15 hold - Modified Thomas Stretch  - 2 x daily - 7 x weekly - 2 reps - 15 hold - Supine Bridge  - 1 x daily - 7 x weekly - 2 sets - 10 reps - Standing Shoulder Horizontal Abduction with Resistance  - 1 x daily - 7 x weekly - 2 sets - 10 reps  GOALS: Goals reviewed with patient? Yes  SHORT TERM GOALS: Target date: 4 weeks, 08/03/23  I home program for posture and for hamstring, hip flexor stretchig Baseline: Goal status: ongoing 08/02/23, ongoing 08/23/23, MET 10/12/23  LONG TERM GOALS: Target date:  01/17/24  30 sec sit to stand improve from 6 reps to 12 reps for improved coordination, strength, endurance LEs Baseline:  Goal status: 7 reps ongoing 08/04/23, 8 reps 08/23/23, 11 reps 09/26/23, 10 reps 10/17/23, 9 reps 11/03/23  2.  I and pain free supine to sit motion , rolling to R Baseline: very painful per pt and wife history, not specifically assessed in clinic today Goal status: ongoing 07/28/23, ongoing  08/09/23, still has pain with bed mobility 08/23/23, can do but still painful 09/26/23, does not hurt as bad 10/12/23, still hurting with this 11/03/23, still painful 12/06/23  3.  TUG score improve from 33 sec to 18 or less for reduced fall risk   Baseline:  Goal status: 27s ongoing 08/04/23, 22s 08/23/23, 20s 09/26/23, 20s 10/17/23, 11/03/23 20 seconds, difficulty with the turns, 30s 12/06/23  4.  Improve strength LE's for B quads, hamstrings to 4+5 for improved functional strength Baseline: 4- Goal status: 4/5 08/04/23, 5/5 MET 08/23/23   ASSESSMENT:  CLINICAL IMPRESSION: Patient more unsteady today than in previous visits with freezing gait frequently. He seems to be processing slower today as well in regards to cognition. Reports some pain in R hip and groin area today. He did go to cycling class this morning and stopped on the pain began. Pt was much slower overall in session. Unable to get TKE on RLE with LAQ and reports some pain.  Pt was much slower overall in session, which limited how much we did today. Will continue to address and progress his routine as appropriate.   EVAL Patient is a 78 y.o. male who was evaluated today by physical therapy due to referral from his neurologist, with decline in function, particularly control of R extremities, with his Parkinson's diagnosis.  He and his wife have been proactive about maintaining his function, he attends a training/ strengthening session locally 2 x week , attends parkinson's cycling weekly, and walks around 0.6 miles with his aide 3 x week  outdoors with his rollator.  They recently have adapted their home with a main floor renovation so that their bedroom, bath, kitchen are all on one level.   Main problems identified today are weakness B LE's, L ankle is weak chronically due to lumbar radiculopathy and B knee, hips musculature is weak.  He also has coordination deficits with festinating gait particularly on turns and static / dynamic balance deficits.  Has chronic R sided lower back pain which is particularly intense with moving supine to sit.  He should benefit from physical therapy intervention to address his deficits and update his current routine for stretching and strengthening at home.    OBJECTIVE IMPAIRMENTS: Abnormal gait, decreased activity tolerance, decreased balance, decreased coordination, decreased endurance, decreased mobility, difficulty walking, decreased strength, hypomobility, impaired perceived functional ability, impaired UE functional use, postural dysfunction, and pain.   ACTIVITY LIMITATIONS: carrying, lifting, squatting, stairs, transfers, and locomotion level  PARTICIPATION LIMITATIONS: meal prep, cleaning, laundry, shopping, community activity, and church  PERSONAL FACTORS: Age, Behavior pattern, Fitness, Past/current experiences, Time since onset of injury/illness/exacerbation, and 1-2 comorbidities: h/o L lumbar radiculopathy and weakness L LE, chronic R sided lower back pain, are also affecting patient's functional outcome.   REHAB POTENTIAL: Good  CLINICAL DECISION MAKING: Evolving/moderate complexity  EVALUATION COMPLEXITY: Moderate  PLAN:  PT FREQUENCY: 2x/week  PT DURATION: 12 weeks  PLANNED INTERVENTIONS: 97110-Therapeutic exercises, 97530- Therapeutic activity, [id number]- Neuromuscular re-education, 97535- Self Care, and [id number]- Manual therapy  PLAN FOR NEXT SESSION: continue with balance, stretching, coordination activities, PWR based activities , renewal performed  Almetta Fam, PT,  DPT 12/06/23 3:30 PM

## 2023-12-06 ENCOUNTER — Ambulatory Visit: Attending: Family Medicine | Admitting: Occupational Therapy

## 2023-12-06 ENCOUNTER — Ambulatory Visit

## 2023-12-06 DIAGNOSIS — R262 Difficulty in walking, not elsewhere classified: Secondary | ICD-10-CM

## 2023-12-06 DIAGNOSIS — R41844 Frontal lobe and executive function deficit: Secondary | ICD-10-CM | POA: Diagnosis present

## 2023-12-06 DIAGNOSIS — R278 Other lack of coordination: Secondary | ICD-10-CM | POA: Diagnosis present

## 2023-12-06 DIAGNOSIS — R41840 Attention and concentration deficit: Secondary | ICD-10-CM | POA: Insufficient documentation

## 2023-12-06 DIAGNOSIS — R2689 Other abnormalities of gait and mobility: Secondary | ICD-10-CM | POA: Insufficient documentation

## 2023-12-06 DIAGNOSIS — G20B2 Parkinson's disease with dyskinesia, with fluctuations: Secondary | ICD-10-CM

## 2023-12-06 DIAGNOSIS — R29818 Other symptoms and signs involving the nervous system: Secondary | ICD-10-CM | POA: Insufficient documentation

## 2023-12-06 NOTE — Therapy (Signed)
 OUTPATIENT OCCUPATIONAL THERAPY [facility]  Patient Name: Patrick Ross MRN: [id number] DOB:11/03/1945, 78 y.o., male Today's Date: 12/06/2023  PCP: Dr. Millicent REFERRING PROVIDER:Dr. Tat  END OF SESSION:  OT End of Session - 12/06/23 1426     Visit Number 19    Number of Visits 25    Date for Recertification  01/26/24    Authorization Type MCR, [employer]    Authorization Time Period 12 weeks, anticipate d/c after 8 weeks    Authorization - Visit Number 19    Progress Note Due on Visit 20    OT Start Time 1407    OT Stop Time 1445    OT Time Calculation (min) 38 min    Activity Tolerance Patient limited by fatigue    Behavior During Therapy WFL for tasks assessed/performed                    Past Medical History:  Diagnosis Date   Depression    DVT (deep venous thrombosis) (HCC)    GERD (gastroesophageal reflux disease)    History of kidney stones    passed   Hypertension    Hypothyroidism    Lumbar stenosis    OSA (obstructive sleep apnea)    wears cpap   Parkinson's disease (HCC)    PONV (postoperative nausea and vomiting)    REM behavioral disorder    Urinary incontinence    Past Surgical History:  Procedure Laterality Date   BACK SURGERY     x 2 - not fusion   BIOPSY  06/01/2022   Procedure: BIOPSY;  Surgeon: Dianna Specking, MD;  Location: [facility];  Service: Gastroenterology;;   COLONOSCOPY     DEEP BRAIN STIMULATOR PLACEMENT     left 12/27/2012, right 12/10/2011, left revision 02/02/2013, replacement bilateral 11/21/2015   ESOPHAGOGASTRODUODENOSCOPY (EGD) WITH PROPOFOL  N/A 06/01/2022   Procedure: ESOPHAGOGASTRODUODENOSCOPY (EGD) WITH PROPOFOL ;  Surgeon: Dianna Specking, MD;  Location: [facility];  Service: Gastroenterology;  Laterality: N/A;   MINOR PLACEMENT OF FIDUCIAL N/A 01/09/2019   Procedure: Fiducial placement;  Surgeon: Unice Pac, MD;  Location: [facility] OR;  Service: Neurosurgery;  Laterality: N/A;  Fiducial placement    PARTIAL NEPHRECTOMY Right 2005 ish   PULSE GENERATOR IMPLANT Right 01/23/2019   Procedure: Right chest implantable pulse generator change;  Surgeon: Unice Pac, MD;  Location: [facility] OR;  Service: Neurosurgery;  Laterality: Right;   SPINAL FUSION     lumbar Fusion x 2   SUBTHALAMIC STIMULATOR BATTERY REPLACEMENT Left 02/02/2018   Procedure: Change implantable pulse generator battery, Left chest;  Surgeon: Unice Pac, MD;  Location: [facility] OR;  Service: Neurosurgery;  Laterality: Left;   SUBTHALAMIC STIMULATOR BATTERY REPLACEMENT N/A 01/22/2020   Procedure: REMOVAL OF LEFT BRAIN DEEP BRAIN STIMULATOR ELECTRODE, EXTENSION, AND PULSE GENERATOR;  Surgeon: Unice Pac, MD;  Location: [facility] OR;  Service: Neurosurgery;  Laterality: N/A;   SUBTHALAMIC STIMULATOR BATTERY REPLACEMENT Right 02/23/2022   Procedure: CHANGE IPG BATTERY, RT CHEST;  Surgeon: Dawley, Lani JAYSON, DO;  Location: [facility] OR;  Service: Neurosurgery;  Laterality: Right;   SUBTHALAMIC STIMULATOR INSERTION Left 01/16/2019   Procedure: Left deep brain stimulator electrode revision.;  Surgeon: Unice Pac, MD;  Location: [facility] OR;  Service: Neurosurgery;  Laterality: Left;   SUBTHALAMIC STIMULATOR INSERTION Left 01/23/2019   Procedure: Repositioning of Left Deep Brain Stimulator Electrode;  Surgeon: Unice Pac, MD;  Location: [facility] OR;  Service: Neurosurgery;  Laterality: Left;   TONSILLECTOMY     Patient Active Problem List  Diagnosis Date Noted   Melena 06/01/2022   Acute cystitis without hematuria 05/31/2022   Upper GI bleed 05/31/2022   Generalized weakness 05/30/2022   Abdominal pain 05/30/2022   OSA (obstructive sleep apnea) 05/30/2022   Hypothyroidism 05/30/2022   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) 12/14/2021   Mixed hyperlipidemia 12/14/2021   Essential hypertension 12/14/2021   Palpitations 12/14/2021   Angina pectoris 12/14/2021   Urge incontinence 01/15/2021   History of UTI 01/15/2021   Abnormal urine  findings 01/15/2021   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   History of nephrolithiasis 01/15/2021   DVT, lower extremity, proximal, acute, right (HCC) 02/26/2020   Open scalp wound 01/22/2020   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HCC)    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   Parkinson's disease (HCC) 01/16/2018   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) 01/16/2018   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    ONSET DATE: 07/28/23- referral date  REFERRING DIAG:  Diagnosis  Z74.1 (ICD-10-CM) - Requires assistance with activities of daily living (ADL)  G20.B2 (ICD-10-CM) - Parkinson's disease with dyskinesia and fluctuating manifestations (HCC)    THERAPY DIAG:  Other lack of coordination  Attention and concentration deficit  Other symptoms and signs involving the nervous system  Frontal lobe and executive function deficit  Other abnormalities of gait and mobility  Rationale for Evaluation and Treatment: Rehabilitation  SUBJECTIVE:   SUBJECTIVE STATEMENT: Pt reports hip and groin pain Pt accompanied by: self  PERTINENT HISTORY: parkinson's diagnosed greater than 10 yrs ago, deep brain stimulator R, had in L as well but had to be removed for various reasons. Depression, sleep apnea, hx of back surgery, see hx above walks around neighborhood 3 x week with aide Tuesdays and Thursdays parkisons bicycle class 2 x Drue training for upper body work out   PRECAUTIONS: Fall- PT is addressing  WEIGHT BEARING RESTRICTIONS: No  PAIN:  Are you having pain? Yes: NPRS scale: 8/10,  Pain location:  hip, groin,  Pain description: aching Aggravating factors: malposiitoning Relieving factors: rest,  wrist pain 3/10, malpositioning aggaravates, rest alleviates FALLS: Has patient fallen in last 6 months? No  LIVING ENVIRONMENT: Lives with: lives with their spouse, has CNA 3x weeks Lives in: House/apartment Stairs: Yes: Internal: has stairt lift, and coverted garage appt on ground level Has following  equipment at home: U-step  PLOF: Needs assistance with ADLs and Needs assistance with homemaking  PATIENT GOALS: Pt wants to be able to tie shoes, and more I with dressing  OBJECTIVE:  Note: Objective measures were completed at Evaluation unless otherwise noted.  HAND DOMINANCE: Right  ADLs: Overall ADLs: increased time required  Transfers/ambulation related to ADLs: Eating: difficulty cutting food, needs assist  Grooming: mod I with brushing teeth, combing, CNA assists with shaving UB Dressing: mod I LB Dressing: mod-max A for LB dressing Toileting: mod I with U-step Bathing: supervision-walk in shower, level has built in seat and grab bars Tub Shower transfers: min A Equipment: Grab bars and level walk in shower with built in bench  IADLs:Dependent with home management  Medication management: wife assists Financial management: wife handles Handwriting: Moderate micrographia at sentence level  MOBILITY STATUS: mod I with U-step  POSTURE COMMENTS:  rounded shoulders and forward head   FUNCTIONAL OUTCOME MEASURES: Fastening/unfastening 3 buttons: 1 min 43 secs   COORDINATION: 9 Hole Peg test: Right: 55.08 sec; Left: 49.17 sec Box and Blocks:  Right 26blocks, Left 34blocks  UE ROM:  A/ROM:shoulder flexion RUE-100 LUE105, elbow extension RUE-35 LUE -25, decreased  bilateral supination, RUE 95% finger extension, maintains fingers flexed    SENSATION: WFL    COGNITION: Overall cognitive status: Impaired, slowed processing and short term memory deficits  OBSERVATIONS: Bradykinesia and resting tremor RUE, dystonia RUE                                                                                                                    TREATMENT DATE:12/06/23- Pt continues to wear thumb spica brace. Pt saw ortho MD who recommended continued wear of brace and no therapy to hand/ wrist for 3 weeks. Pt to f/u with MD in 3 weeks. Per MD note no fracture visualized, likely  wrist sprain. Pt's therapy today focused on big whole body movements. Pt ambulated from waiting room to OT treatment room requiring min-mod v.c supervision and 3 standing rest breaks. Seated PWR! basic 4 with mod v.c and demonstration, close supervison/ min A as pt's sitting balance was off today. PWR! rock at [employer], [employer] and close supervision, PWR! step to Left side while flipping playing cards, supervision, mod v.c Marching at countertop, min v.c and supervision. Pt required frequent rest breaks today and pt demonstrated delayed processing speed. Therapist discussed with pt's wife at end of session. Pt sees Dr. Evonnie at the end of the month.     11/28/23- Pt arrived wearing a pre-fab wrist brace. He reports a fall at home. Pt saw PCP and they put him the brace and he reports that inital x-ray did not show a fx. Pt is scheduled to see ortho next  Tuesday. Pt was encouraged to limit heavy weightbearing through RUE as able. Therapsit discussed fall reduction strategies with pt.(ie: avoiding crossing feet) UBE x 6 mins level 1 for conditioning wearing wrist brace Seated pt practice donning/ doffing jacket with min -mod v.c and min A. Standing dynamic PWR! rock then [employer]! step, min v.c for amplitude. Simulated eating with foam grip, scooping with cues to turn spoon to come straight into mouth, min v.c and demonstration.   11/23/22 seated PWR! up, then bag exerises for simulated ADLs. simulated donning shirt then cumpling the bag in each hand with left and right hands, mod/ max difficulty with RUE. Pt pracetice donning/ doffing jacket in seated and standing. Pt required minA for safety in standing seated, min A initaly, and min v.c Pt demonstrates improved safety and will benefit from practice. Simulated eating using foam grip on spoon, pt with improved performance. Pt was issued red putty for sustained grip and pinch due to significnat weakness from botox . Pt was instructed that if his  spasticiy, dystonia returns to stop using the putty. 11/14/23-modified quadraped rock at [employer], then [employer]! rock in standing, at countertop, Dynamic step and reach to each side to flip playing cards min-mod v.c for amplitude Marching at countertop, min v.c PWR! hands basic 4, min-mod v.c and demonstration. Handwriting activities using a variety of pens and grips. Pt trialed foam grip however he perfromed better with felt tip pen. mod v.c and demonstration for  printing large. Pt was able to print his name legibily with practice using felt  tip pen and increased time.  11/09/23-modified quadraped rock at [employer], then [employer]! rock in standing, at countertop, mod v.c and demonstration. Reviewed PWR hands and coordination HEP and re-ssued so pt can practice with his hired caregiver.  11/03/23- reveiwed deep breathing strategies, min-mod v.c and demonstration  PWR! up in seated x 10 reps  modified quadraped rock at [employer], then [employer]! rock in standing, at countertop, mod v.c and demonstration.  PWR! hands for PWR! up, twist Therapist checked progress towards goals and discussed plans for renewal with pt/ wife. see goals below for progress.  Box/  blocks R 31, L 34 9 hole peg test: R 4 pegs in 2 mins, L 50.90 secs PPT#2 :16.15 secs unable to write name legibily    PATIENT EDUCATION: Education details:    red putty HEP Person educated: Patient,  Education method: Explanation demonstration, v.c, handout Education comprehension: verbalized understanding, returned demonstration, v.c  HOME EXERCISE PROGRAM: PWR! hands, beginning coordination-08/22/23  GOALS: Goals reviewed with patient? Yes  SHORT TERM GOALS: Target date: 12/03/23   I with PD specific HEP  Goal status: met 11/09/23  2.  Pt will demonstrate understanding of adapted strategies/ AE to increase safety and I with ADLs/IADLs. Goal status: ongoing therapist has initated, pt can benefit from continued reinforcement 11/03/23  3.   Pt will demonstrate improved bilateral UE use as evidenced by increasing box and blocks score by 3 blocks bilaterally.   Goal status: met-RUE 40 blocks,  LUE 39 blocks, goal 09/14/23 (RUE 31 blocks and LUE 34 blocks on 11/03/23,  4.  Pt will demonstrate improved bilateral UE functional use and ease with dressing as evidenced by decreasing 3 button/ unbutton test time by 5 secs.  Goal status: not met,  >3 mins, 10/03/23 goal deferred 11/03/23  5.  Pt will demonstrate ability to retrieve a lightweight object at 105 shoulder flexion and -30 elbow extension with RUE.  Goal status:  met, 125, -10 10/03/23  6.  Pt will demonstrate ability to retrieve a lightweight object at 110 shoulder flexion and -20 elbow extension with LUE  Goal status:met, 125, -20 10/03/23  LONG TERM GOALS: Target date: 01/26/24- may d/c sooner   Pt will demonstrate improved fine motor coordination for ADLs as evidenced by decreasing 9 hole peg test score for RUE by 4 secs Revised goal: Pt will place 9 pegs in 2 mins or less.   Goal status:  ongoing-RUE 4 pegs placed in 2 mins 11/03/23  2.  Pt will demonstrate improved fine motor coordination for ADLs as evidenced by decreasing 9 hole peg test score for LUE by 4 secs  Goal status: ongoing, LUE 50.90 secs 11/03/23   3..  Pt will perfrom PPT#4 in 1 min15 secs or less without assist or LOB.  Baseline: 1 min 23 secs with min A- 09/14/23          Goal status;  ongoing 11/03/23   4.  Pt will verbalize understanding of ways to prevent future PD related complications  Goal status: ongoing, need reinforcement. 12/06/23  5.  Pt will write a short paragraph with 100% legibility and minimal decrease in letter size Revised goal: Pt will write his name legibly  Goal status: ongoing, increased difficulty with RUE use since botox , unable to write legibily 10/25/23  6. Pt will perform PPT#2 in 15 secs or less Baseline: 17.02 on inital eval  Goal status: ongoing-16.15  11/02/23   7. Pt will  demonstrate improved RUE grip strength for increased functional use as evidenced by increasing RUE grip strength to 25# Baseline: RUE 25#, LUE 55#- 11/23/23  Goal status: new   ASSESSMENT:  CLINICAL IMPRESSION: Pt. is progressing slowly towards goals.Pt therapy today focused on big movements . Pt wore wrist brace for the entire session. OT to avoid therapy to wrist and hand until MD clearance.Performance deficits:ADLs, IADLs, coordination, dexterity, tone, ROM, strength, flexibility, Fine motor control, Gross motor control, mobility, balance, endurance, decreased knowledge of precautions, decreased knowledge of use of DME, vision, and UE functional use, cognitive skills including attention, memory, problem solving, safety awareness, sequencing, and thought, and psychosocial skills including coping strategies, environmental adaptation, habits, interpersonal interactions, and routines and behaviors.   IMPAIRMENTS: are limiting patient from ADLs, IADLs, rest and sleep, play, leisure, and social participation.   COMORBIDITIES:  may have co-morbidities  that affects occupational performance. Patient will benefit from skilled OT to address above impairments and improve overall function.  MODIFICATION OR ASSISTANCE TO COMPLETE EVALUATION: Min-Moderate modification of tasks or assist with assess necessary to complete an evaluation.  OT OCCUPATIONAL PROFILE AND HISTORY: Detailed assessment: Review of records and additional review of physical, cognitive, psychosocial history related to current functional performance.  CLINICAL DECISION MAKING: Moderate - several treatment options, min-mod task modification necessary  REHAB POTENTIAL: Good  EVALUATION COMPLEXITY: Low    PLAN:  OT FREQUENCY: 2x/week  OT DURATION: 12 weeks  PLANNED INTERVENTIONS: 97168 OT Re-evaluation, 97535 self care/ADL training, 02889 therapeutic exercise, 97530 therapeutic activity, 97112 neuromuscular re-education, 97140  manual therapy, 97116 gait training, 02886 aquatic therapy, 97035 ultrasound, 97018 paraffin, 02989 moist heat, 97010 cryotherapy, 97034 contrast bath, 97129 Cognitive training (first 15 min), 02869 Cognitive training(each additional 15 min), passive range of motion, balance training, functional mobility training, visual/perceptual remediation/compensation, energy conservation, coping strategies training, patient/family education, and DME and/or AE instructions  RECOMMENDED OTHER SERVICES: n/a  CONSULTED AND AGREED WITH PLAN OF CARE: Patient  PLAN FOR NEXT SESSION: therapy focusing on big movements and use of LUE, pt to wear wrist brace until he sees MD in 3 weeks. Onnika Siebel, OT 12/06/2023, 2:27 PM

## 2023-12-12 ENCOUNTER — Telehealth: Payer: Self-pay

## 2023-12-12 NOTE — Telephone Encounter (Signed)
 Patients wife called and left voicemail that patient had fallen. She did take patient to [facility] and they did imaging thought he had a hairline fracture. Patient has been going to [place] and PT with [place]. Patient is having Cognitive issues and forgetting to pull up pants before walking out of bathroom. Patient also is struggling to walk and he is rigid. Patients wife wanting to come in earlier. They are scheduled for 10/27 and I let patients wife Marval know that Dr. Evonnie will be back in office 10/20 but do not think we will be able to work him in

## 2023-12-14 ENCOUNTER — Ambulatory Visit: Admitting: Occupational Therapy

## 2023-12-14 ENCOUNTER — Encounter: Payer: Self-pay | Admitting: Occupational Therapy

## 2023-12-14 DIAGNOSIS — R278 Other lack of coordination: Secondary | ICD-10-CM | POA: Diagnosis not present

## 2023-12-14 DIAGNOSIS — R29818 Other symptoms and signs involving the nervous system: Secondary | ICD-10-CM

## 2023-12-14 DIAGNOSIS — G20B2 Parkinson's disease with dyskinesia, with fluctuations: Secondary | ICD-10-CM

## 2023-12-14 NOTE — Therapy (Signed)
 OUTPATIENT OCCUPATIONAL THERAPY [facility]  Patient Name: Patrick Ross MRN: [id number] DOB:23-Mar-1945, 78 y.o., male Today's Date: 12/14/2023  PCP: Dr. Millicent REFERRING PROVIDER:Dr. Tat  END OF SESSION:  OT End of Session - 12/14/23 1653     Visit Number 20    Number of Visits 25    Authorization - Visit Number 20    Progress Note Due on Visit 20    OT Start Time 1450    OT Stop Time 1530    OT Time Calculation (min) 40 min                    Past Medical History:  Diagnosis Date   Depression    DVT (deep venous thrombosis) (HCC)    GERD (gastroesophageal reflux disease)    History of kidney stones    passed   Hypertension    Hypothyroidism    Lumbar stenosis    OSA (obstructive sleep apnea)    wears cpap   Parkinson's disease (HCC)    PONV (postoperative nausea and vomiting)    REM behavioral disorder    Urinary incontinence    Past Surgical History:  Procedure Laterality Date   BACK SURGERY     x 2 - not fusion   BIOPSY  06/01/2022   Procedure: BIOPSY;  Surgeon: Dianna Specking, MD;  Location: [facility];  Service: Gastroenterology;;   COLONOSCOPY     DEEP BRAIN STIMULATOR PLACEMENT     left 12/27/2012, right 12/10/2011, left revision 02/02/2013, replacement bilateral 11/21/2015   ESOPHAGOGASTRODUODENOSCOPY (EGD) WITH PROPOFOL  N/A 06/01/2022   Procedure: ESOPHAGOGASTRODUODENOSCOPY (EGD) WITH PROPOFOL ;  Surgeon: Dianna Specking, MD;  Location: [facility];  Service: Gastroenterology;  Laterality: N/A;   MINOR PLACEMENT OF FIDUCIAL N/A 01/09/2019   Procedure: Fiducial placement;  Surgeon: Unice Pac, MD;  Location: [facility];  Service: Neurosurgery;  Laterality: N/A;  Fiducial placement   PARTIAL NEPHRECTOMY Right 2005 ish   PULSE GENERATOR IMPLANT Right 01/23/2019   Procedure: Right chest implantable pulse generator change;  Surgeon: Unice Pac, MD;  Location: [facility] OR;  Service: Neurosurgery;  Laterality: Right;   SPINAL FUSION      lumbar Fusion x 2   SUBTHALAMIC STIMULATOR BATTERY REPLACEMENT Left 02/02/2018   Procedure: Change implantable pulse generator battery, Left chest;  Surgeon: Unice Pac, MD;  Location: [facility] OR;  Service: Neurosurgery;  Laterality: Left;   SUBTHALAMIC STIMULATOR BATTERY REPLACEMENT N/A 01/22/2020   Procedure: REMOVAL OF LEFT BRAIN DEEP BRAIN STIMULATOR ELECTRODE, EXTENSION, AND PULSE GENERATOR;  Surgeon: Unice Pac, MD;  Location: [facility] OR;  Service: Neurosurgery;  Laterality: N/A;   SUBTHALAMIC STIMULATOR BATTERY REPLACEMENT Right 02/23/2022   Procedure: CHANGE IPG BATTERY, RT CHEST;  Surgeon: Dawley, Lani JAYSON, DO;  Location: [facility] OR;  Service: Neurosurgery;  Laterality: Right;   SUBTHALAMIC STIMULATOR INSERTION Left 01/16/2019   Procedure: Left deep brain stimulator electrode revision.;  Surgeon: Unice Pac, MD;  Location: [facility] OR;  Service: Neurosurgery;  Laterality: Left;   SUBTHALAMIC STIMULATOR INSERTION Left 01/23/2019   Procedure: Repositioning of Left Deep Brain Stimulator Electrode;  Surgeon: Unice Pac, MD;  Location: [facility] OR;  Service: Neurosurgery;  Laterality: Left;   TONSILLECTOMY     Patient Active Problem List   Diagnosis Date Noted   Melena 06/01/2022   Acute cystitis without hematuria 05/31/2022   Upper GI bleed 05/31/2022   Generalized weakness 05/30/2022   Abdominal pain 05/30/2022   OSA (obstructive sleep apnea) 05/30/2022   Hypothyroidism 05/30/2022   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) 12/14/2021   Mixed hyperlipidemia 12/14/2021   Essential hypertension 12/14/2021   Palpitations 12/14/2021   Angina pectoris 12/14/2021   Urge incontinence 01/15/2021   History of UTI 01/15/2021   Abnormal urine findings 01/15/2021   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   History of nephrolithiasis 01/15/2021   DVT, lower extremity, proximal, acute, right (HCC) 02/26/2020   Open scalp wound 01/22/2020   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HCC)    RBD (REM behavioral disorder)  05/08/2018   Parkinson's disease (HCC) 01/16/2018   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) 01/16/2018   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    ONSET DATE: 07/28/23- referral date  REFERRING DIAG:  Diagnosis  Z74.1 (ICD-10-CM) - Requires assistance with activities of daily living (ADL)  G20.B2 (ICD-10-CM) - Parkinson's disease with dyskinesia and fluctuating manifestations (HCC)    THERAPY DIAG:  Other lack of coordination  Other symptoms and signs involving the nervous system  Parkinson's disease with dyskinesia, with fluctuations (HCC)  Rationale for Evaluation and Treatment: Rehabilitation  SUBJECTIVE:   SUBJECTIVE STATEMENT: Pt reports hip and groin pain Pt accompanied by: self  PERTINENT HISTORY: parkinson's diagnosed greater than 10 yrs ago, deep brain stimulator R, had in L as well but had to be removed for various reasons. Depression, sleep apnea, hx of back surgery, see hx above walks around neighborhood 3 x week with aide Tuesdays and Thursdays parkisons bicycle class 2 x Drue training for upper body work out   PRECAUTIONS: Fall- PT is addressing  WEIGHT BEARING RESTRICTIONS: No  PAIN:  Are you having pain? Yes: NPRS scale: 8/10,  Pain location:  hip, groin,  Pain description: aching Aggravating factors: malposiitoning Relieving factors: rest,  wrist pain 3/10, malpositioning aggaravates, rest alleviates FALLS: Has patient fallen in last 6 months? No  LIVING ENVIRONMENT: Lives with: lives with their spouse, has CNA 3x weeks Lives in: House/apartment Stairs: Yes: Internal: has stairt lift, and coverted garage appt on ground level Has following equipment at home: U-step  PLOF: Needs assistance with ADLs and Needs assistance with homemaking  PATIENT GOALS: Pt wants to be able to tie shoes, and more I with dressing  OBJECTIVE:  Note: Objective measures were completed at Evaluation unless otherwise noted.  HAND DOMINANCE: Right  ADLs: Overall ADLs:  increased time required  Transfers/ambulation related to ADLs: Eating: difficulty cutting food, needs assist  Grooming: mod I with brushing teeth, combing, CNA assists with shaving UB Dressing: mod I LB Dressing: mod-max A for LB dressing Toileting: mod I with U-step Bathing: supervision-walk in shower, level has built in seat and grab bars Tub Shower transfers: min A Equipment: Grab bars and level walk in shower with built in bench  IADLs:Dependent with home management  Medication management: wife assists Financial management: wife handles Handwriting: Moderate micrographia at sentence level  MOBILITY STATUS: mod I with U-step  POSTURE COMMENTS:  rounded shoulders and forward head   FUNCTIONAL OUTCOME MEASURES: Fastening/unfastening 3 buttons: 1 min 43 secs   COORDINATION: 9 Hole Peg test: Right: 55.08 sec; Left: 49.17 sec Box and Blocks:  Right 26blocks, Left 34blocks  UE ROM:  A/ROM:shoulder flexion RUE-100 LUE105, elbow extension RUE-35 LUE -25, decreased bilateral supination, RUE 95% finger extension, maintains fingers flexed    SENSATION: WFL    COGNITION: Overall cognitive status: Impaired, slowed processing and short term memory deficits  OBSERVATIONS: Bradykinesia and resting tremor RUE, dystonia RUE  TREATMENT DATE:12/14/23- Pt arrived today continuing to wear his R thumb spica brace. Pt reports significant R hip and groin pain. Per recent urgent care notes MD thought that pt may have sciatic issues. Pt ambulated to treatment room with mod v.c to take larger steps and several freezing episdes noted. Seate on mat, pt perfromed a figure 4 stretch for RUe as able, then he practiced bring knee towards chest assisting with a towel then hand under thigh. PWR! stepping in seated as able  with RLE, mod v.c Standing PWR! rock x 8 reps then pt had to take  a break due to pain and dizziness. Seated at table PWR! hands with LUE followed by flipping playing cards with big movements, mod v.c Therapist had pt's wife join her at the end of session as well as PT arbie Garrel [place]. Therapist's recommend that pt goes to get an xray of R wrist and hip as a precuationary measure as pt has had pain for several weeks since fall. Pt's wife to cancel next visit if pt. situation is not improved. Pt demonstrates a cognitive decline as well.  12/06/23- Pt continues to wear thumb spica brace. Pt saw ortho MD who recommended continued wear of brace and no therapy to hand/ wrist for 3 weeks. Pt to f/u with MD in 3 weeks. Per MD note no fracture visualized, likely wrist sprain. Pt's therapy today focused on big whole body movements. Pt ambulated from waiting room to OT treatment room requiring min-mod v.c supervision and 3 standing rest breaks. Seated PWR! basic 4 with mod v.c and demonstration, close supervison/ min A as pt's sitting balance was off today. PWR! rock at [employer], [employer] and close supervision, PWR! step to Left side while flipping playing cards, supervision, mod v.c Marching at countertop, min v.c and supervision. Pt required frequent rest breaks today and pt demonstrated delayed processing speed. Therapist discussed with pt's wife at end of session. Pt sees Dr. Evonnie at the end of the month.     11/28/23- Pt arrived wearing a pre-fab wrist brace. He reports a fall at home. Pt saw PCP and they put him the brace and he reports that inital x-ray did not show a fx. Pt is scheduled to see ortho next  Tuesday. Pt was encouraged to limit heavy weightbearing through RUE as able. Therapsit discussed fall reduction strategies with pt.(ie: avoiding crossing feet) UBE x 6 mins level 1 for conditioning wearing wrist brace Seated pt practice donning/ doffing jacket with min -mod v.c and min A. Standing dynamic PWR! rock then [employer]! step, min v.c for  amplitude. Simulated eating with foam grip, scooping with cues to turn spoon to come straight into mouth, min v.c and demonstration.   11/23/22 seated PWR! up, then bag exerises for simulated ADLs. simulated donning shirt then cumpling the bag in each hand with left and right hands, mod/ max difficulty with RUE. Pt pracetice donning/ doffing jacket in seated and standing. Pt required minA for safety in standing seated, min A initaly, and min v.c Pt demonstrates improved safety and will benefit from practice. Simulated eating using foam grip on spoon, pt with improved performance. Pt was issued red putty for sustained grip and pinch due to significnat weakness from botox . Pt was instructed that if his spasticiy, dystonia returns to stop using the putty. 11/14/23-modified quadraped rock at [employer], then [employer]! rock in standing, at countertop, Dynamic step and reach to each side to flip playing cards min-mod v.c for amplitude Marching at  countertop, min v.c PWR! hands basic 4, min-mod v.c and demonstration. Handwriting activities using a variety of pens and grips. Pt trialed foam grip however he perfromed better with felt tip pen. mod v.c and demonstration for printing large. Pt was able to print his name legibily with practice using felt  tip pen and increased time.  11/09/23-modified quadraped rock at [employer], then [employer]! rock in standing, at countertop, mod v.c and demonstration. Reviewed PWR hands and coordination HEP and re-ssued so pt can practice with his hired caregiver.  11/03/23- reveiwed deep breathing strategies, min-mod v.c and demonstration  PWR! up in seated x 10 reps  modified quadraped rock at [employer], then [employer]! rock in standing, at countertop, mod v.c and demonstration.  PWR! hands for PWR! up, twist Therapist checked progress towards goals and discussed plans for renewal with pt/ wife. see goals below for progress.  Box/  blocks R 31, L 34 9 hole peg test: R 4 pegs in 2  mins, L 50.90 secs PPT#2 :16.15 secs unable to write name legibily    PATIENT EDUCATION: Education details: recommendations for wrist and hip xray Person educated: Patient, wife Education method: Explanation demonstration, v.c,  Education comprehension: verbalized understanding,   HOME EXERCISE PROGRAM: PWR! hands, beginning coordination-08/22/23  GOALS: Goals reviewed with patient? Yes  SHORT TERM GOALS: Target date: 12/03/23   I with PD specific HEP  Goal status: met 11/09/23  2.  Pt will demonstrate understanding of adapted strategies/ AE to increase safety and I with ADLs/IADLs. Goal status: ongoing therapist has initated, pt can benefit from continued reinforcement 11/03/23  3.  Pt will demonstrate improved bilateral UE use as evidenced by increasing box and blocks score by 3 blocks bilaterally.   Goal status: met-RUE 40 blocks,  LUE 39 blocks, goal 09/14/23 (RUE 31 blocks and LUE 34 blocks on 11/03/23,  4.  Pt will demonstrate improved bilateral UE functional use and ease with dressing as evidenced by decreasing 3 button/ unbutton test time by 5 secs.  Goal status: not met,  >3 mins, 10/03/23 goal deferred 11/03/23  5.  Pt will demonstrate ability to retrieve a lightweight object at 105 shoulder flexion and -30 elbow extension with RUE.  Goal status:  met, 125, -10 10/03/23  6.  Pt will demonstrate ability to retrieve a lightweight object at 110 shoulder flexion and -20 elbow extension with LUE  Goal status:met, 125, -20 10/03/23  LONG TERM GOALS: Target date: 01/26/24- may d/c sooner   Pt will demonstrate improved fine motor coordination for ADLs as evidenced by decreasing 9 hole peg test score for RUE by 4 secs Revised goal: Pt will place 9 pegs in 2 mins or less.   Goal status:  ongoing-RUE 4 pegs placed in 2 mins 11/03/23, not tested 12/14/23  2.  Pt will demonstrate improved fine motor coordination for ADLs as evidenced by decreasing 9 hole peg test score for LUE by 4  secs  Goal status: ongoing, LUE 50.90 secs 11/03/23,   3..  Pt will perfrom PPT#4 in 1 min15 secs or less without assist or LOB.  Baseline: 1 min 23 secs with min A- 09/14/23          Goal status;   ongoing, limited by pain 12/14/23   4.  Pt will verbalize understanding of ways to prevent future PD related complications  Goal status: ongoing, need reinforcement. 12/14/23  5.  Pt will write a short paragraph with 100% legibility and minimal decrease in letter size Revised  goal: Pt will write his name legibly  Goal status: ongoing, not tested due to precuations 12/14/23  6. Pt will perform PPT#2 in 15 secs or less Baseline: 17.02 on inital eval  Goal status: ongoing 12/14/23  7. Pt will demonstrate improved RUE grip strength for increased functional use as evidenced by increasing RUE grip strength to 25# Baseline: RUE 25#, LUE 55#- 11/23/23  Goal status: ongoing, NT due to precuations 12/14/23   ASSESSMENT:  CLINICAL IMPRESSION: For the reporting period of 10/03/23-12/14/23, pt's overall progress has been limited by pain in hip and wrist s/p fall. Pt goals have not been fully reassessed due to precautions related to wrist sprain/ injury. Therapist has recommended that pt goes to orthopedic urgent care for an x ray of his wrist and hip.Pt also demonstrates a cognive decline. Pt/ wife verbalize understanding of recommendations. Pt can benefit from continued occupational therapy to maximize pt's safety, and I with ADLs onece medical issues/ pain has been addressed.SABRAPerformance deficits:ADLs, IADLs, coordination, dexterity, tone, ROM, strength, flexibility, Fine motor control, Gross motor control, mobility, balance, endurance, decreased knowledge of precautions, decreased knowledge of use of DME, vision, and UE functional use, cognitive skills including attention, memory, problem solving, safety awareness, sequencing, and thought, and psychosocial skills including coping strategies, environmental  adaptation, habits, interpersonal interactions, and routines and behaviors.   IMPAIRMENTS: are limiting patient from ADLs, IADLs, rest and sleep, play, leisure, and social participation.   COMORBIDITIES:  may have co-morbidities  that affects occupational performance. Patient will benefit from skilled OT to address above impairments and improve overall function.  MODIFICATION OR ASSISTANCE TO COMPLETE EVALUATION: Min-Moderate modification of tasks or assist with assess necessary to complete an evaluation.  OT OCCUPATIONAL PROFILE AND HISTORY: Detailed assessment: Review of records and additional review of physical, cognitive, psychosocial history related to current functional performance.  CLINICAL DECISION MAKING: Moderate - several treatment options, min-mod task modification necessary  REHAB POTENTIAL: Good  EVALUATION COMPLEXITY: Low    PLAN:  OT FREQUENCY: 2x/week  OT DURATION: 12 weeks  PLANNED INTERVENTIONS: 97168 OT Re-evaluation, 97535 self care/ADL training, 02889 therapeutic exercise, 97530 therapeutic activity, 97112 neuromuscular re-education, 97140 manual therapy, 97116 gait training, 02886 aquatic therapy, 97035 ultrasound, 97018 paraffin, 02989 moist heat, 97010 cryotherapy, 97034 contrast bath, 97129 Cognitive training (first 15 min), 02869 Cognitive training(each additional 15 min), passive range of motion, balance training, functional mobility training, visual/perceptual remediation/compensation, energy conservation, coping strategies training, patient/family education, and DME and/or AE instructions  RECOMMENDED OTHER SERVICES: n/a  CONSULTED AND AGREED WITH PLAN OF CARE: Patient  PLAN FOR NEXT SESSION: progress per MD recommendations Jabar Krysiak, OT 12/14/2023, 4:54 PM

## 2023-12-21 ENCOUNTER — Ambulatory Visit

## 2023-12-21 ENCOUNTER — Ambulatory Visit: Admitting: Occupational Therapy

## 2023-12-23 NOTE — Progress Notes (Signed)
 Assessment/Plan:   1.  Parkinsons Disease             -The patient underwent right STN DBS on December 10, 2011 and a left STN DBS on December 27, 2012.  Postoperatively, his left STN DBS lead was found to be displaced and had revision of the left lead on February 02, 2013 (pulled back 1 cm without MER).   IPG on the RIGHT changed on 01/23/19 and February 23, 2022.  He now has a [place] IPG.             -Patient underwent lead revision on January 16, 2019 on the left, as the lead was still displaced/deep.  It was pulled back 7.5 mm.  Postoperative imaging indicated that it was still 5 mm too deep, so when the battery was changed to the following week, it was pulled back another 5 mm.  Postoperative imaging indicates that the leads were in good place, but the patient hit his head on the corner of a wall and developed an abrasion over the lead, which ultimately exposed to lead.  This led to a wound infection, requiring that the entire system on the left be removed on January 22, 2020.  We have opted not to replace this lead, as the patient has really clinically done pretty well, but we also worry about skin integrity on the scalp.    - Take  carbidopa /levodopa  25/100, 2 at 7am (decrease), 2 at 9:30am, 3 at noon, 2 at 2:30 pm, 2 at 5pm.   - Continue carbidopa /levodopa  50/200 twice per day  -Continue amantadine , 100 mg once per day.  We tried to stop it and he actually felt worse off of it.  He thought his memory was actually worse as well.  -Continue Inbrija  as needed, up to 5 times per day, separated by 2-hour intervals.  He generally gets this in 1 times per day.  Discussed the indications for which he would want to take this  -[facility], [facility] ramp and other equipment for the home.  He also has an aide in the home from the [place].  - Patient is currently just started in therapies at SNF after hip fracture.    2.  RBD             -Continue clonazepam , 0.5 mg, 1.5 tablets at  night.  PDMP reviewed.  No red flags.  Last filled September 17, 04/2023.   3.  Urinary incontinence             -Following with urology.  Mybetriq without relief.  Gemtysa without relief.  Discussed PTNS, but told them they will need to follow-up with urology.   4.  Low back pain             -Has follwed with Dr. Carilyn as well as [facility]   [facility]  5.  Depression  -Continue Lexapro , 20 mg daily.  This was refilled today.  - We did discuss the importance of physical as well as mental health.  He is in physical, occupational, speech therapy at [facility].  We talked about the value of mental health/counseling, which he may want to engage in while at [facility] as well.  6.  Sleep apnea  -Following with neurology in [facility] (sees the PA).  On CPAP.  7.  R hand/leg dystonia  - He saw Dr. Carilyn July, 2025 who agreed that  Botox  could be beneficial.  Patient had 1 injection and found that it made his hand weak and decided not to go back.  I told him that he may want to go back for a lower dose in the future. 8.  Mild Parkinson's disease dementia  - Most recent neurocognitive testing in July, 2025 with evidence of mild PDD  Subjective:   Patrick Ross was seen today.  Patient accompanied by wife who supplements history.  We did change his levodopa  slightly last visit so that he took his carbidopa /levodopa  50/200 twice per day in addition to his other levodopa .  He tolerated that well.  He had neurocognitive testing with Dr. Gayland September 21, 2023.  Findings were most consistent with mild Parkinson's related dementia.  I did send the patient to Dr. Carilyn because of focal dystonia last visit, primarily in his right hand.  Dr. Carilyn agreed and recommended Botox  and the patient had that done, but it made his hand weak and he was nervous about going back in the future for further injections.  Unfortunately, he fell in September (was getting up  and one hand on dining room table and lost balance and fell on outstretched hand) and was evaluated at the urgent care, but the complaint was of hand/wrist pain.  He came to the emergency room a few weeks after that and was complaining about hip pain.  Evaluation demonstrated a right femoral hip fracture.   Patient underwent hip surgery October 18.  Patient's hospital stay was complicated by DVT in the right upper thigh.  Patient was placed on Eliquis .  This was felt to be needed indefinitely, as he had a history of prior DVT.  Wife does tell me he had a fall in the hospital trying to get up by himself in the hospital.  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  Patient was discharged to subacute nursing facility October 21 for rehab.  He is in [facility] now and nonambulatory right now as ortho doesn't want much wait on that right now.  He is not in pain.  No hallucinations now but did have some in the hospital.  Current prescribed movement disorder medications: carbidopa /levodopa  25/100,  3 at 7am, 2 at 9:30am, 3 at noon, 2 at 2:30 pm, 2 at 5pm (increased) Carbidopa /levodopa  50/200 bid (increased) Clonazepam  0.5 mg, 1-1/2 tablets at bedtime Amantadine , 100 mg once per day Inbrija  (using qd - bid ) Lexapro , 20 mg daily   PREVIOUS MEDICATIONS: amantadine ; klonopin ; carbidopa /levodopa  25/100; carbidopa /levodopa  50/200; stalevo ; Osmolex  -had dizziness with higher dosages and unable to afford the $60 per month co-pay for the lower dose; entacapone  (confusion); amantadine  (stopped to see if dizziness got better and he felt worse off of the medicine in terms of stiffness so he went back on it)   ALLERGIES:   Allergies  Allergen Reactions   Other Other (See Comments)    Surgical spray prior to adhesive causes blisters   Sulfamethoxazole Other (See Comments)    Unsure--childhood allergy    CURRENT MEDICATIONS:  Outpatient Encounter Medications as of 12/26/2023  Medication Sig   amantadine  (SYMMETREL ) 100 MG  capsule Take 1 capsule (100 mg total) by mouth daily.   AMBULATORY NON FORMULARY MEDICATION Lift chair Dx:  G20   apixaban  (ELIQUIS ) 5 MG TABS tablet Take 5 mg by mouth 2 (two) times daily.   Ascorbic Acid 100 MG CHEW Chew by mouth.   Bacillus Coagulans-Inulin (PROBIOTIC-PREBIOTIC PO) Take 1 capsule by mouth in the morning. Doterra PB  Assist+ Probiotic Defense Formula   carbidopa -levodopa  (SINEMET  CR) 50-200 MG tablet Take 1 tablet by mouth 2 (two) times daily.   carbidopa -levodopa  (SINEMET  IR) 25-100 MG tablet 2 at 7am, 2 at 9:30am, 3 at noon, 2 at 2:30 pm, 2 at 5pm.   Cholecalciferol (VITAMIN D3 PO) Take 1 tablet by mouth daily.   clonazePAM  (KLONOPIN ) 0.5 MG tablet TAKE ONE AND ONE-HALF TABLETS AT BEDTIME   cycloSPORINE (RESTASIS) 0.05 % ophthalmic emulsion Place 1 drop into both eyes 2 (two) times daily.   escitalopram  (LEXAPRO ) 20 MG tablet Take 1 tablet (20 mg total) by mouth daily.   Levodopa  (INBRIJA ) 42 MG CAPS Place 84 mg into inhaler and inhale 2 (two) times daily as needed (Parkinson's). ORALLY INHALE CONTENTS OF 2 CAPSULES AS NEEDED FOR SYMPTOMS OF AN OFF PERIOD (DO NOT EXCEED 5 DOSES IN 1 DAY)   levothyroxine  (SYNTHROID ) 125 MCG tablet Take 125 mcg by mouth daily before breakfast.   lisinopril  (ZESTRIL ) 40 MG tablet Take 40 mg by mouth in the morning.   methenamine (HIPREX) 1 g tablet Take by mouth.   Multiple Vitamins-Minerals (CELLULAR SECURITY PO) Take 1 capsule by mouth at bedtime. doTERRA Alpha CRS+ (In the morning & at lunch)   Multiple Vitamins-Minerals (IMMUNE SUPPORT PO) Take 1 capsule by mouth in the morning. doTERRA On Guard Protective Blend   Omega-3 Fatty Acids (OMEGA 3 PO) Take 2 capsules by mouth in the morning and at bedtime. Doterra xEO Mega (In the morning & at lunch) 2qam 1qhs   omeprazole  (PRILOSEC) 20 MG capsule Take 1 capsule (20 mg total) by mouth 2 (two) times daily before a meal.   OVER THE COUNTER MEDICATION Take 1 tablet by mouth 2 (two) times daily.  Doterra Bone & Joint   oxycodone  (OXY-IR) 5 MG capsule Take 5 mg by mouth every 4 (four) hours as needed for pain.   rosuvastatin  (CRESTOR ) 20 MG tablet Take 20 mg by mouth daily at 12 noon.   TURMERIC PO Take 1 tablet by mouth daily.   No facility-administered encounter medications on file as of 12/26/2023.    Objective:   PHYSICAL EXAMINATION:    VITALS:   Vitals:   12/26/23 1420  BP: 105/65  Pulse: 75  SpO2: 95%  Weight: 207 lb 12.8 oz (94.3 kg)    GEN:  The patient appears stated age and is in NAD. HEENT:  Normocephalic, atraumatic.  Neurological examination:  Orientation: The patient is alert and oriented x3. Cranial nerves: There is good facial symmetry with facial hypomimia, lips parted. The speech is fluent and clear.  He is dysarthric.  Soft palate rises symmetrically and there is no tongue deviation. Hearing is intact to conversational tone. Sensation: Sensation is intact to light touch throughout Motor: Strength is at least antigravity x4.  Movement examination: Tone: There is mild increased tone in the right upper extremity.  The dystonia that was previously present is better.  Abnormal movements: none  Coordination:  There is mild decremation on the R with finger taps and hand opening and closing Gait and Station: Unable, as he is not able to full weight-bear per orthopedics for now I have reviewed and interpreted the following labs independently   Chemistry      Component Value Date/Time   NA 142 06/01/2022 0448   K 3.4 (L) 06/01/2022 0448   CL 109 06/01/2022 0448   CO2 28 06/01/2022 0448   BUN 20 06/01/2022 0448   CREATININE 0.73 06/01/2022 0448  Component Value Date/Time   CALCIUM  8.1 (L) 06/01/2022 0448   ALKPHOS 49 05/31/2022 0322   AST 12 (L) 05/31/2022 0322   ALT 6 05/31/2022 0322   BILITOT 0.7 05/31/2022 0322       DBS programming was performed today which is described in more detail on a separate programming procedure note.    Total  time spent on today's visit was 45 minutes, including both face-to-face time and nonface-to-face time.  Time included that spent on review of records (prior notes available to me/labs/imaging if pertinent), discussing treatment and goals, answering patient's questions and coordinating care.  This did not include DBS programming time, described in more detail on separate programming procedural note.    Cc:  Millicent Sharper, MD

## 2023-12-26 ENCOUNTER — Ambulatory Visit (INDEPENDENT_AMBULATORY_CARE_PROVIDER_SITE_OTHER): Admitting: Neurology

## 2023-12-26 ENCOUNTER — Encounter: Payer: Self-pay | Admitting: Neurology

## 2023-12-26 VITALS — BP 105/65 | HR 75 | Wt 207.8 lb

## 2023-12-26 DIAGNOSIS — Z9689 Presence of other specified functional implants: Secondary | ICD-10-CM | POA: Diagnosis not present

## 2023-12-26 DIAGNOSIS — G249 Dystonia, unspecified: Secondary | ICD-10-CM

## 2023-12-26 DIAGNOSIS — F330 Major depressive disorder, recurrent, mild: Secondary | ICD-10-CM

## 2023-12-26 DIAGNOSIS — G4752 REM sleep behavior disorder: Secondary | ICD-10-CM

## 2023-12-26 DIAGNOSIS — G20B2 Parkinson's disease with dyskinesia, with fluctuations: Secondary | ICD-10-CM

## 2023-12-26 MED ORDER — ESCITALOPRAM OXALATE 20 MG PO TABS: 20.0000 mg | ORAL_TABLET | Freq: Every day | ORAL | 1 refills | Status: AC

## 2023-12-26 NOTE — Procedures (Signed)
 DBS Programming was performed.    Manufacturer of DBS device: Medtronic but with [employer]  Total time spent programming was 11  minutes.  Device was confirmed to be on.  Soft start was confirmed to be on.  Impedences were checked and were within normal limits.  Battery was checked and was determined to be functioning normally and not near the end of life.  Final settings were as follows with program 1 active:

## 2023-12-28 ENCOUNTER — Ambulatory Visit

## 2023-12-28 ENCOUNTER — Ambulatory Visit: Admitting: Occupational Therapy

## 2024-01-10 ENCOUNTER — Encounter: Admitting: Physical Medicine & Rehabilitation

## 2024-02-06 ENCOUNTER — Encounter: Admitting: Neurology

## 2024-02-17 ENCOUNTER — Other Ambulatory Visit: Payer: Self-pay

## 2024-02-17 MED ORDER — CARBIDOPA-LEVODOPA 25-100 MG PO TABS: ORAL_TABLET | ORAL | 0 refills | Status: AC

## 2024-02-17 NOTE — Telephone Encounter (Signed)
 Pt's wife Marval called in this morning and she would like a refill  on Pt prescription called: carbidopa -levodopa  (SINEMET  IR) 25-100 MG tablet . Please send it to [facility] on[address] Thanks

## 2024-02-17 NOTE — Telephone Encounter (Signed)
Sent to Dr. Tat  ?

## 2024-02-17 NOTE — Progress Notes (Unsigned)
 "   Assessment/Plan:   1.  Parkinsons Disease             -The patient underwent right STN DBS on December 10, 2011 and a left STN DBS on December 27, 2012.  Postoperatively, his left STN DBS lead was found to be displaced and had revision of the left lead on February 02, 2013 (pulled back 1 cm without MER).   IPG on the RIGHT changed on 01/23/19 and February 23, 2022.  He now has a [place] IPG.             -Patient underwent lead revision on January 16, 2019 on the left, as the lead was still displaced/deep.  It was pulled back 7.5 mm.  Postoperative imaging indicated that it was still 5 mm too deep, so when the battery was changed to the following week, it was pulled back another 5 mm.  Postoperative imaging indicates that the leads were in good place, but the patient hit his head on the corner of a wall and developed an abrasion over the lead, which ultimately exposed to lead.  This led to a wound infection, requiring that the entire system on the left be removed on January 22, 2020.  We have opted not to replace this lead, as the patient has really clinically done pretty well, but we also worry about skin integrity on the scalp.    - Take  carbidopa /levodopa  25/100, 2 at 7am (decrease), 2 at 9:30am, 3 at noon, 2 at 2:30 pm, 2 at 5pm.   - Continue carbidopa /levodopa  50/200 twice per day  -Continue amantadine , 100 mg once per day.  We tried to stop it and he actually felt worse off of it.  He thought his memory was actually worse as well.  -Continue Inbrija  as needed, up to 5 times per day, separated by 2-hour intervals.  He generally gets this in 1 times per day.  Discussed the indications for which he would want to take this  -[facility], home ramp and other equipment for the home.  He also has an aide in the home from the [place].  - Patient is currently just started in therapies at SNF after hip fracture.    2.  RBD             -Continue clonazepam , 0.5 mg, 1.5 tablets at  night.  PDMP reviewed.  No red flags.  Last filled September 17, 04/2023.   3.  Urinary incontinence             -Following with urology.  Mybetriq without relief.  Gemtysa without relief.  Discussed PTNS, but told them they will need to follow-up with urology.   4.  Low back pain             -Has follwed with Dr. Carilyn as well as [facility]surgery.   [facility]  5.  Depression  -Continue Lexapro , 20 mg daily.  This was refilled today.  - We did discuss the importance of physical as well as mental health.  He is in physical, occupational, speech therapy at [facility].  We talked about the value of mental health/counseling, which he may want to engage in while at [facility] as well.  6.  Sleep apnea  -Following with neurology in [facility] (sees the PA).  On CPAP.  7.  R hand/leg dystonia  - He saw Dr. Carilyn July, 2025 who agreed  that Botox  could be beneficial.  Patient had 1 injection and found that it made his hand weak and decided not to go back.  I told him that he may want to go back for a lower dose in the future. 8.  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  Now on Eliquis .  Primary care did note that they are watching this closely given risk for recurrent falls.  Subjective:   Patrick Ross was seen today.  Patient accompanied by wife who supplements history.  Patient has done really well post hip fracture.  I last saw him October 27.  Current prescribed movement disorder medications: carbidopa /levodopa  25/100,  3 at 7am, 2 at 9:30am, 3 at noon, 2 at 2:30 pm, 2 at 5pm (increased) Carbidopa /levodopa  50/200 bid (increased) Clonazepam  0.5 mg, 1-1/2 tablets at bedtime Amantadine , 100 mg once per day Inbrija  (using qd - bid ) Lexapro , 20 mg daily   PREVIOUS MEDICATIONS:  amantadine ; klonopin ; carbidopa /levodopa  25/100; carbidopa /levodopa  50/200; stalevo ; Osmolex  -had dizziness with higher dosages and unable to afford the $60 per month co-pay for the lower dose; entacapone  (confusion); amantadine  (stopped to see if dizziness got better and he felt worse off of the medicine in terms of stiffness so he went back on it)   ALLERGIES:   Allergies  Allergen Reactions   Other Other (See Comments)    Surgical spray prior to adhesive causes blisters   Sulfamethoxazole Other (See Comments)    Unsure--childhood allergy    CURRENT MEDICATIONS:  Outpatient Encounter Medications as of 02/20/2024  Medication Sig   amantadine  (SYMMETREL ) 100 MG capsule Take 1 capsule (100 mg total) by mouth daily.   AMBULATORY NON FORMULARY MEDICATION Lift chair Dx:  G20   Ascorbic Acid 100 MG CHEW Chew by mouth.   Bacillus Coagulans-Inulin (PROBIOTIC-PREBIOTIC PO) Take 1 capsule by mouth in the morning. Doterra PB Assist+ Probiotic Defense Formula   carbidopa -levodopa  (SINEMET  CR) 50-200 MG tablet Take 1 tablet by mouth 2 (two) times daily.   carbidopa -levodopa  (SINEMET  IR) 25-100 MG tablet 2 at 7am, 2 at 9:30am, 3 at noon, 2 at 2:30 pm, 2 at 5pm.   Cholecalciferol (VITAMIN D3 PO) Take 1 tablet by mouth daily.   clonazePAM  (KLONOPIN ) 0.5 MG tablet TAKE ONE AND ONE-HALF TABLETS AT BEDTIME   cycloSPORINE (RESTASIS) 0.05 % ophthalmic emulsion Place 1 drop into both eyes 2 (two) times daily.   escitalopram  (LEXAPRO ) 20 MG tablet Take 1 tablet (20 mg total) by mouth daily.   Levodopa  (INBRIJA ) 42 MG CAPS Place 84 mg into inhaler and inhale 2 (two) times daily as needed (Parkinson's). ORALLY INHALE CONTENTS OF 2 CAPSULES AS NEEDED FOR SYMPTOMS OF AN OFF PERIOD (DO NOT EXCEED 5 DOSES IN 1 DAY)   levothyroxine  (SYNTHROID ) 125 MCG tablet Take 125 mcg by mouth daily before breakfast.   lisinopril  (ZESTRIL ) 40 MG tablet Take 40 mg by mouth in the morning.   methenamine (HIPREX) 1 g tablet Take by  mouth.   Multiple Vitamins-Minerals (CELLULAR SECURITY PO) Take 1 capsule by mouth at bedtime. doTERRA Alpha CRS+ (In the morning & at lunch)   Multiple Vitamins-Minerals (IMMUNE SUPPORT PO) Take 1 capsule by mouth in the morning. doTERRA On Guard Protective Blend   Omega-3 Fatty Acids (OMEGA 3 PO) Take 2 capsules by mouth in the morning and  at bedtime. Doterra xEO Mega (In the morning & at lunch) 2qam 1qhs   omeprazole  (PRILOSEC) 20 MG capsule Take 1 capsule (20 mg total) by mouth 2 (two) times daily before a meal.   OVER THE COUNTER MEDICATION Take 1 tablet by mouth 2 (two) times daily. Doterra Bone & Joint   oxycodone  (OXY-IR) 5 MG capsule Take 5 mg by mouth every 4 (four) hours as needed for pain.   rosuvastatin  (CRESTOR ) 20 MG tablet Take 20 mg by mouth daily at 12 noon.   TURMERIC PO Take 1 tablet by mouth daily.   No facility-administered encounter medications on file as of 02/20/2024.    Objective:   PHYSICAL EXAMINATION:    VITALS:   There were no vitals filed for this visit.   GEN:  The patient appears stated age and is in NAD. HEENT:  Normocephalic, atraumatic.  Neurological examination:  Orientation: The patient is alert and oriented x3. Cranial nerves: There is good facial symmetry with facial hypomimia, lips parted. The speech is fluent and clear.  He is dysarthric.  Soft palate rises symmetrically and there is no tongue deviation. Hearing is intact to conversational tone. Sensation: Sensation is intact to light touch throughout Motor: Strength is at least antigravity x4.  Movement examination: Tone: There is mild increased tone in the right upper extremity.  The dystonia that was previously present is better.  Abnormal movements: none  Coordination:  There is mild decremation on the R with finger taps and hand opening and closing Gait and Station: Unable, as he is not able to full weight-bear per orthopedics for now I have reviewed and interpreted the following  labs independently   Chemistry      Component Value Date/Time   NA 142 06/01/2022 0448   K 3.4 (L) 06/01/2022 0448   CL 109 06/01/2022 0448   CO2 28 06/01/2022 0448   BUN 20 06/01/2022 0448   CREATININE 0.73 06/01/2022 0448      Component Value Date/Time   CALCIUM  8.1 (L) 06/01/2022 0448   ALKPHOS 49 05/31/2022 0322   AST 12 (L) 05/31/2022 0322   ALT 6 05/31/2022 0322   BILITOT 0.7 05/31/2022 0322       DBS programming was performed today which is described in more detail on a separate programming procedure note.    Total time spent on today's visit was *** minutes, including both face-to-face time and nonface-to-face time.  Time included that spent on review of records (prior notes available to me/labs/imaging if pertinent), discussing treatment and goals, answering patient's questions and coordinating care.  This did not include DBS programming time, described in more detail on separate programming procedural note.    Cc:  Millicent Sharper, MD "

## 2024-02-20 ENCOUNTER — Encounter: Payer: Self-pay | Admitting: Neurology

## 2024-02-20 ENCOUNTER — Ambulatory Visit: Admitting: Neurology

## 2024-02-20 VITALS — BP 126/78 | HR 74 | Wt 220.8 lb

## 2024-02-20 DIAGNOSIS — Z9689 Presence of other specified functional implants: Secondary | ICD-10-CM | POA: Diagnosis not present

## 2024-02-20 DIAGNOSIS — I82409 Acute embolism and thrombosis of unspecified deep veins of unspecified lower extremity: Secondary | ICD-10-CM

## 2024-02-20 DIAGNOSIS — G20B2 Parkinson's disease with dyskinesia, with fluctuations: Secondary | ICD-10-CM | POA: Diagnosis not present

## 2024-02-20 DIAGNOSIS — G4752 REM sleep behavior disorder: Secondary | ICD-10-CM | POA: Diagnosis not present

## 2024-02-20 NOTE — Procedures (Signed)
 DBS Programming was performed.    Manufacturer of DBS device: [employer]tronic but with [employer]  Total time spent programming was 8  minutes.  Device was confirmed to be on.  Soft start was confirmed to be on.  Impedences were checked and were within normal limits.  Battery was checked and was determined to be functioning normally and not near the end of life.  Final settings were as follows with program 1 active:

## 2024-03-08 ENCOUNTER — Other Ambulatory Visit: Payer: Self-pay

## 2024-03-08 ENCOUNTER — Telehealth: Payer: Self-pay | Admitting: Neurology

## 2024-03-08 DIAGNOSIS — F330 Major depressive disorder, recurrent, mild: Secondary | ICD-10-CM

## 2024-03-08 MED ORDER — CLONAZEPAM 0.5 MG PO TABS: ORAL_TABLET | ORAL | 1 refills | Status: AC

## 2024-03-08 NOTE — Telephone Encounter (Signed)
 [facility] Call  ID: [phone]  PH: [phone]  Caller states her husband needs a refill of his medication.

## 2024-03-08 NOTE — Telephone Encounter (Signed)
 Called patients wife and he is needing Clonazepam  I have sent to Dr. Evonnie for approval

## 2024-03-19 ENCOUNTER — Ambulatory Visit: Attending: Neurology

## 2024-03-19 DIAGNOSIS — R262 Difficulty in walking, not elsewhere classified: Secondary | ICD-10-CM | POA: Insufficient documentation

## 2024-03-19 DIAGNOSIS — R29818 Other symptoms and signs involving the nervous system: Secondary | ICD-10-CM | POA: Insufficient documentation

## 2024-03-19 DIAGNOSIS — R278 Other lack of coordination: Secondary | ICD-10-CM | POA: Diagnosis present

## 2024-03-19 DIAGNOSIS — R2689 Other abnormalities of gait and mobility: Secondary | ICD-10-CM | POA: Insufficient documentation

## 2024-03-19 DIAGNOSIS — G20B2 Parkinson's disease with dyskinesia, with fluctuations: Secondary | ICD-10-CM | POA: Diagnosis present

## 2024-03-19 NOTE — Progress Notes (Signed)
 " OUTPATIENT PHYSICAL THERAPY NEURO EVALUATION   Patient Name: Patrick Ross MRN: [id number] DOB:[birth year], 79 y.o., male Today's Date: 03/19/2024   PCP: Millicent Sharper, MD REFERRING PROVIDER: Evonnie Asberry RAMAN, DO  END OF SESSION:  PT End of Session - 03/19/24 1726     Visit Number 1    Authorization Type Medicare    PT Start Time 1445    PT Stop Time 1530    PT Time Calculation (min) 45 min          Past Medical History:  Diagnosis Date   Depression    DVT (deep venous thrombosis) (HCC)    GERD (gastroesophageal reflux disease)    History of kidney stones    passed   Hypertension    Hypothyroidism    Lumbar stenosis    OSA (obstructive sleep apnea)    wears cpap   Parkinson's disease (HCC)    PONV (postoperative nausea and vomiting)    REM behavioral disorder    Urinary incontinence    Past Surgical History:  Procedure Laterality Date   BACK SURGERY     x 2 - not fusion   BIOPSY  06/01/2022   Procedure: BIOPSY;  Surgeon: Dianna Specking, MD;  Location: [facility];  Service: Gastroenterology;;   COLONOSCOPY     DEEP BRAIN STIMULATOR PLACEMENT     left 12/27/2012, right 12/10/2011, left revision 02/02/2013, replacement bilateral 11/21/2015   ESOPHAGOGASTRODUODENOSCOPY (EGD) WITH PROPOFOL  N/A 06/01/2022   Procedure: ESOPHAGOGASTRODUODENOSCOPY (EGD) WITH PROPOFOL ;  Surgeon: Dianna Specking, MD;  Location: [facility];  Service: Gastroenterology;  Laterality: N/A;   MINOR PLACEMENT OF FIDUCIAL N/A 01/09/2019   Procedure: Fiducial placement;  Surgeon: Unice Pac, MD;  Location: [facility] OR;  Service: Neurosurgery;  Laterality: N/A;  Fiducial placement   PARTIAL NEPHRECTOMY Right 2005 ish   PULSE GENERATOR IMPLANT Right 01/23/2019   Procedure: Right chest implantable pulse generator change;  Surgeon: Unice Pac, MD;  Location: [facility] OR;  Service: Neurosurgery;  Laterality: Right;   SPINAL FUSION     lumbar Fusion x 2   SUBTHALAMIC STIMULATOR BATTERY REPLACEMENT Left  02/02/2018   Procedure: Change implantable pulse generator battery, Left chest;  Surgeon: Unice Pac, MD;  Location: [facility] OR;  Service: Neurosurgery;  Laterality: Left;   SUBTHALAMIC STIMULATOR BATTERY REPLACEMENT N/A 01/22/2020   Procedure: REMOVAL OF LEFT BRAIN DEEP BRAIN STIMULATOR ELECTRODE, EXTENSION, AND PULSE GENERATOR;  Surgeon: Unice Pac, MD;  Location: [facility] OR;  Service: Neurosurgery;  Laterality: N/A;   SUBTHALAMIC STIMULATOR BATTERY REPLACEMENT Right 02/23/2022   Procedure: CHANGE IPG BATTERY, RT CHEST;  Surgeon: Dawley, Lani JAYSON, DO;  Location: [facility] OR;  Service: Neurosurgery;  Laterality: Right;   SUBTHALAMIC STIMULATOR INSERTION Left 01/16/2019   Procedure: Left deep brain stimulator electrode revision.;  Surgeon: Unice Pac, MD;  Location: [facility] OR;  Service: Neurosurgery;  Laterality: Left;   SUBTHALAMIC STIMULATOR INSERTION Left 01/23/2019   Procedure: Repositioning of Left Deep Brain Stimulator Electrode;  Surgeon: Unice Pac, MD;  Location: [facility] OR;  Service: Neurosurgery;  Laterality: Left;   TONSILLECTOMY     Patient Active Problem List   Diagnosis Date Noted   Melena 06/01/2022   Acute cystitis without hematuria 05/31/2022   Upper GI bleed 05/31/2022   Generalized weakness 05/30/2022   Abdominal pain 05/30/2022   OSA (obstructive sleep apnea) 05/30/2022   Hypothyroidism 05/30/2022   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) 12/14/2021   Mixed hyperlipidemia 12/14/2021   Essential hypertension 12/14/2021   Palpitations 12/14/2021  Angina pectoris 12/14/2021   Urge incontinence 01/15/2021   History of UTI 01/15/2021   Abnormal urine findings 01/15/2021   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   History of nephrolithiasis 01/15/2021   DVT, lower extremity, proximal, acute, right (HCC) 02/26/2020   Open scalp wound 01/22/2020   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HCC)    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   Parkinson's disease (HCC) 01/16/2018   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) 01/16/2018   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    ONSET DATE: Parkinson's Diagnosis 2010  REFERRING DIAG: Diagnosis G20.B2 (ICD-10-CM) - Parkinson's disease with dyskinesia and fluctuating manifestations (HCC)  THERAPY DIAG:  Other lack of coordination  Other symptoms and signs involving the nervous system  Parkinson's disease with dyskinesia, with fluctuations (HCC)  Other abnormalities of gait and mobility  Difficulty in walking, not elsewhere classified  Rationale for Evaluation and Treatment: Rehabilitation  SUBJECTIVE:                                                                                                                                                                                             SUBJECTIVE STATEMENT: Pt reports  Pt accompanied by: significant other  PERTINENT HISTORY: See PMH chart above   PAIN:  Are you having pain? Yes: NPRS scale: 3/10 Pain location: R hip and R abdominal  Pain description: discomfort  Aggravating factors: Certain reaching movements; prolonged activity Relieving factors: Rest   PRECAUTIONS: None  RED FLAGS: None   WEIGHT BEARING RESTRICTIONS: No  FALLS: Has patient fallen in last 6 months? Not in the past 6 months but hx of falls   LIVING ENVIRONMENT: Lives with: lives with their spouse Lives in: House/apartment Stairs: has internal steps, but has had home renovated and adapted so the he and his wife live in downstairs apartment, all one level with bathroom and kitchen Has following equipment at home: Single point cane, Environmental Consultant - 4 wheeled, Tour manager, Grab bars, and Ramped entry  PLOF: Independent with basic ADLs  PATIENT GOALS: To move better around the house; get out of bed easier   OBJECTIVE:  Note: Objective measures were completed at Evaluation unless otherwise noted.   COGNITION: Overall cognitive status: Within functional limits for tasks  assessed   SENSATION: WFL   POSTURE: rounded shoulders  LOWER EXTREMITY ROM:     R hip flexion 25% deficit   LOWER EXTREMITY MMT:    MMT Right Eval Left Eval  Hip flexion 2+/5 5/5  Hip extension    Hip abduction 4/5 4/5  Hip adduction 4/5 4/5  Hip internal rotation    Hip external rotation    Knee  flexion 2+/5 4-/5  Knee extension 3/5 4/5  Ankle dorsiflexion    Ankle plantarflexion    Ankle inversion    Ankle eversion    (Blank rows = not tested)   TRANSFERS: Sit to stand: Modified independence  Assistive device utilized: Environmental Consultant - 4 wheeled     Stand to sit: Modified independence  Assistive device utilized: Environmental Consultant - 4 wheeled       STAIRS: Not tested GAIT: Findings: Gait Characteristics: step to pattern, decreased step length- Right, decreased step length- Left, and shuffling, Distance walked: In Clinic , Assistive device utilized:Walker - 4 wheeled, Level of assistance: Modified independence, and Comments:    FUNCTIONAL TESTS:  Timed up and go (TUG): 26s 30s STS: 7 Reps                                                                                                                               TREATMENT DATE: 03/19/24- Eval    PATIENT EDUCATION: Education details: HEP Person educated: Patient and Spouse Education method: Medical Illustrator Education comprehension: verbalized understanding and returned demonstration  HOME EXERCISE PROGRAM: [id number]  GOALS: Goals reviewed with patient? Yes  SHORT TERM GOALS: Target date: 04/23/24  Patient will be independent with initial HEP. Baseline:  Goal status: INITIAL  2.  Patient will demonstrate decreased fall risk by scoring <14 sec on TUG. Baseline: 26s Goal status: INITIAL   LONG TERM GOALS: Target date: 05/28/24  Patient will be independent with advanced/ongoing HEP to improve outcomes and carryover.  Baseline:  Goal status: INITIAL  2.  Patient will be able to perform >10 STS in a 30s  timeframe  Baseline: 7 reps Goal status: INITIAL  3.  Patient will demonstrate improved functional LE strength as demonstrated by 5/5. Baseline:  Goal status: INITIAL   5.  Patient will be able to perform supine to sit transfer independently in an adequate timeframe <1 minute and without pain in order to get out of bed at home without assistance.  Baseline:  Goal status: INITIAL    ASSESSMENT:  CLINICAL IMPRESSION: Patient is a 79 y.o. male who was seen today for physical therapy evaluation and treatment for symptoms related to Parkinson's disease which pt has been diagnosed with. Pt exhibits slight forward trunk lean with a tendency to let RW get out farther out of his BOS. Pt utilizes AD for community ambulation and at home. Pt with decreased strength at the R hip joint and will benefit from skilled PT to improve gait quality, teach fall prevention strategies, and increase strength.   OBJECTIVE IMPAIRMENTS: decreased activity tolerance, decreased balance, difficulty walking, decreased ROM, decreased strength, and pain.   ACTIVITY LIMITATIONS: lifting, bending, and squatting  PARTICIPATION LIMITATIONS: cleaning, laundry, and driving  PERSONAL FACTORS: Age and Time since onset of injury/illness/exacerbation are also affecting patient's functional outcome.   REHAB POTENTIAL: Good  CLINICAL DECISION MAKING: Stable/uncomplicated  EVALUATION COMPLEXITY: Low  PLAN:  PT FREQUENCY:  1-2x/week  PT DURATION: 10 weeks  PLANNED INTERVENTIONS: 97110-Therapeutic exercises, 97530- Therapeutic activity, [id number]- Neuromuscular re-education, 97535- Self Care, [id number]- Manual therapy, [phone]- Gait training, and Patient/Family education  PLAN FOR NEXT SESSION: Static and dynamic balance activities, gait mechanics, general strengthening/Big movements    Almetta Fam, PT 03/19/2024, 5:28 PM        "

## 2024-03-28 ENCOUNTER — Ambulatory Visit

## 2024-04-02 ENCOUNTER — Ambulatory Visit

## 2024-04-04 ENCOUNTER — Ambulatory Visit

## 2024-04-04 DIAGNOSIS — R41844 Frontal lobe and executive function deficit: Secondary | ICD-10-CM

## 2024-04-04 DIAGNOSIS — R2689 Other abnormalities of gait and mobility: Secondary | ICD-10-CM

## 2024-04-04 DIAGNOSIS — R262 Difficulty in walking, not elsewhere classified: Secondary | ICD-10-CM

## 2024-04-04 DIAGNOSIS — R278 Other lack of coordination: Secondary | ICD-10-CM

## 2024-04-04 DIAGNOSIS — R41840 Attention and concentration deficit: Secondary | ICD-10-CM

## 2024-04-04 DIAGNOSIS — G20B2 Parkinson's disease with dyskinesia, with fluctuations: Secondary | ICD-10-CM

## 2024-04-04 DIAGNOSIS — R41841 Cognitive communication deficit: Secondary | ICD-10-CM

## 2024-04-04 NOTE — Therapy (Signed)
 " OUTPATIENT PHYSICAL THERAPY NEURO TREATMENT     Patient Name: Patrick Ross MRN: [id number] DOB:1945-06-03, 79 y.o., male Today's Date: 03/19/2024     PCP: Millicent Sharper, MD REFERRING PROVIDER: Tat, Asberry RAMAN, DO   END OF SESSION:              Past Medical History:  Diagnosis Date   Depression     DVT (deep venous thrombosis) (HCC)     GERD (gastroesophageal reflux disease)     History of kidney stones      passed   Hypertension     Hypothyroidism     Lumbar stenosis     OSA (obstructive sleep apnea)      wears cpap   Parkinson's disease (HCC)     PONV (postoperative nausea and vomiting)     REM behavioral disorder     Urinary incontinence               Past Surgical History:  Procedure Laterality Date   BACK SURGERY        x 2 - not fusion   BIOPSY   06/01/2022    Procedure: BIOPSY;  Surgeon: Dianna Specking, MD;  Location: [facility];  Service: Gastroenterology;;   COLONOSCOPY       DEEP BRAIN STIMULATOR PLACEMENT        left 12/27/2012, right 12/10/2011, left revision 02/02/2013, replacement bilateral 11/21/2015   ESOPHAGOGASTRODUODENOSCOPY (EGD) WITH PROPOFOL  N/A 06/01/2022    Procedure: ESOPHAGOGASTRODUODENOSCOPY (EGD) WITH PROPOFOL ;  Surgeon: Dianna Specking, MD;  Location: [facility];  Service: Gastroenterology;  Laterality: N/A;   MINOR PLACEMENT OF FIDUCIAL N/A 01/09/2019    Procedure: Fiducial placement;  Surgeon: Unice Pac, MD;  Location: [facility] OR;  Service: Neurosurgery;  Laterality: N/A;  Fiducial placement   PARTIAL NEPHRECTOMY Right 2005 ish   PULSE GENERATOR IMPLANT Right 01/23/2019    Procedure: Right chest implantable pulse generator change;  Surgeon: Unice Pac, MD;  Location: [facility] OR;  Service: Neurosurgery;  Laterality: Right;   SPINAL FUSION        lumbar Fusion x 2   SUBTHALAMIC STIMULATOR BATTERY REPLACEMENT Left 02/02/2018    Procedure: Change implantable pulse generator battery, Left chest;  Surgeon: Unice Pac, MD;   Location: [facility] OR;  Service: Neurosurgery;  Laterality: Left;   SUBTHALAMIC STIMULATOR BATTERY REPLACEMENT N/A 01/22/2020    Procedure: REMOVAL OF LEFT BRAIN DEEP BRAIN STIMULATOR ELECTRODE, EXTENSION, AND PULSE GENERATOR;  Surgeon: Unice Pac, MD;  Location: [facility] OR;  Service: Neurosurgery;  Laterality: N/A;   SUBTHALAMIC STIMULATOR BATTERY REPLACEMENT Right 02/23/2022    Procedure: CHANGE IPG BATTERY, RT CHEST;  Surgeon: Dawley, Lani JAYSON, DO;  Location: [facility] OR;  Service: Neurosurgery;  Laterality: Right;   SUBTHALAMIC STIMULATOR INSERTION Left 01/16/2019    Procedure: Left deep brain stimulator electrode revision.;  Surgeon: Unice Pac, MD;  Location: [facility] OR;  Service: Neurosurgery;  Laterality: Left;   SUBTHALAMIC STIMULATOR INSERTION Left 01/23/2019    Procedure: Repositioning of Left Deep Brain Stimulator Electrode;  Surgeon: Unice Pac, MD;  Location: [facility] OR;  Service: Neurosurgery;  Laterality: Left;   TONSILLECTOMY                Patient Active Problem List    Diagnosis Date Noted   Melena 06/01/2022   Acute cystitis without hematuria 05/31/2022   Upper GI bleed 05/31/2022   Generalized weakness 05/30/2022   Abdominal pain 05/30/2022   OSA (obstructive sleep apnea) 05/30/2022   Hypothyroidism 05/30/2022   Orthostatic  hypotension due to Parkinson's disease (HCC) 12/14/2021   Mixed hyperlipidemia 12/14/2021   Essential hypertension 12/14/2021   Palpitations 12/14/2021   Angina pectoris 12/14/2021   Urge incontinence 01/15/2021   History of UTI 01/15/2021   Abnormal urine findings 01/15/2021   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   History of nephrolithiasis 01/15/2021   DVT, lower extremity, proximal, acute, right (HCC) 02/26/2020   Open scalp wound 01/22/2020   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HCC)     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   Parkinson's disease (HCC) 01/16/2018   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) 01/16/2018   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      ONSET DATE:  Parkinson's Diagnosis 2010   REFERRING DIAG: Diagnosis G20.B2 (ICD-10-CM) - Parkinson's disease with dyskinesia and fluctuating manifestations (HCC)   THERAPY DIAG:  Other lack of coordination   Other symptoms and signs involving the nervous system   Parkinson's disease with dyskinesia, with fluctuations (HCC)   Other abnormalities of gait and mobility   Difficulty in walking, not elsewhere classified   Rationale for Evaluation and Treatment: Rehabilitation   SUBJECTIVE:                                                                                                                                                                                             SUBJECTIVE STATEMENT: Pt reports he is doing good today and has not had any recent falls. Pt states he needs to do HEP more.     PERTINENT HISTORY: See PMH chart above    PAIN:  Are you having pain? Yes: NPRS scale: 3/10 Pain location: R hip and R abdominal  Pain description: discomfort  Aggravating factors: Certain reaching movements; prolonged activity Relieving factors: Rest    PRECAUTIONS: None   RED FLAGS: None      WEIGHT BEARING RESTRICTIONS: No   FALLS: Has patient fallen in last 6 months? Not in the past 6 months but hx of falls    LIVING ENVIRONMENT: Lives with: lives with their spouse Lives in: House/apartment Stairs: has internal steps, but has had home renovated and adapted so the he and his wife live in downstairs apartment, all one level with bathroom and kitchen Has following equipment at home: Single point cane, Environmental Consultant - 4 wheeled, Tour manager, Grab bars, and Ramped entry   PLOF: Independent with basic ADLs   PATIENT GOALS: To move better around the house; get out of bed easier    OBJECTIVE:  Note: Objective measures were completed at Evaluation unless otherwise noted.     COGNITION: Overall cognitive status: Within functional limits for tasks assessed  SENSATION: WFL      POSTURE: rounded shoulders   LOWER EXTREMITY ROM:      R hip flexion 25% deficit    LOWER EXTREMITY MMT:     MMT Right Eval Left Eval  Hip flexion 2+/5 5/5  Hip extension      Hip abduction 4/5 4/5  Hip adduction 4/5 4/5  Hip internal rotation      Hip external rotation      Knee flexion 2+/5 4-/5  Knee extension 3/5 4/5  Ankle dorsiflexion      Ankle plantarflexion      Ankle inversion      Ankle eversion      (Blank rows = not tested)     TRANSFERS: Sit to stand: Modified independence  Assistive device utilized: Environmental Consultant - 4 wheeled     Stand to sit: Modified independence  Assistive device utilized: Environmental Consultant - 4 wheeled         STAIRS: Not tested GAIT: Findings: Gait Characteristics: step to pattern, decreased step length- Right, decreased step length- Left, and shuffling, Distance walked: In Clinic , Assistive device utilized:Walker - 4 wheeled, Level of assistance: Modified independence, and Comments:     FUNCTIONAL TESTS:  Timed up and go (TUG): 26s 30s STS: 7 Reps                                                                                                                                TREATMENT DATE:  04/04/24 NuStep L5 x 6 min  STS 2x10 seat elevated  HS Curls Blue Tband 2x10 Leg ext 2x10 3#s ankle weights  Standing Marches with AD Lateral steps in parallel bars  Sea Urchin Taps forward and backward tapping  Forward Step Overs using foam beam    03/19/24- Eval      PATIENT EDUCATION: Education details: HEP Person educated: Patient and Spouse Education method: Medical Illustrator Education comprehension: verbalized understanding and returned demonstration   HOME EXERCISE PROGRAM: [id number]   GOALS: Goals reviewed with patient? Yes   SHORT TERM GOALS: Target date: 04/23/24   Patient will be independent with initial HEP. Baseline:  Goal status: IN PROGRESS 04/04/24   2.  Patient will demonstrate decreased fall risk by scoring <14 sec  on TUG. Baseline: 26s Goal status: INITIAL     LONG TERM GOALS: Target date: 05/28/24   Patient will be independent with advanced/ongoing HEP to improve outcomes and carryover.  Baseline:  Goal status: ONGOING 04/04/24   2.  Patient will be able to perform >10 STS in a 30s timeframe  Baseline: 7 reps Goal status: IN PROGRESS unchanged 04/04/24   3.  Patient will demonstrate improved functional LE strength as demonstrated by 5/5. Baseline:  Goal status: IN PROGRESS  04/04/24     5.  Patient will be able to perform supine to sit transfer independently in an adequate timeframe <1 minute and without pain in order to get  out of bed at home without assistance.  Baseline:  Goal status: INITIAL      ASSESSMENT:   CLINICAL IMPRESSION:  Pt exhibits increased forward lean with trunk during gait. Pt tends to shuffle feet with decreased toe off phase for propulsion. Pt utilized CGA for dynamic balance activities today. Pt will benefit from from pre gait activities and LE strengthening.     Eval: Patient is a 79 y.o. male who was seen today for physical therapy evaluation and treatment for symptoms related to Parkinson's disease which pt has been diagnosed with. Pt exhibits slight forward trunk lean with a tendency to let RW get out farther out of his BOS. Pt utilizes AD for community ambulation and at home. Pt with decreased strength at the R hip joint and will benefit from skilled PT to improve gait quality, teach fall prevention strategies, and increase strength.    OBJECTIVE IMPAIRMENTS: decreased activity tolerance, decreased balance, difficulty walking, decreased ROM, decreased strength, and pain.    ACTIVITY LIMITATIONS: lifting, bending, and squatting   PARTICIPATION LIMITATIONS: cleaning, laundry, and driving   PERSONAL FACTORS: Age and Time since onset of injury/illness/exacerbation are also affecting patient's functional outcome.    REHAB POTENTIAL: Good   CLINICAL DECISION  MAKING: Stable/uncomplicated   EVALUATION COMPLEXITY: Low   PLAN:   PT FREQUENCY: 1-2x/week   PT DURATION: 10 weeks   PLANNED INTERVENTIONS: 97110-Therapeutic exercises, 97530- Therapeutic activity, [id number]- Neuromuscular re-education, 97535- Self Care, [id number]- Manual therapy, [phone]- Gait training, and Patient/Family education   PLAN FOR NEXT SESSION: Static and dynamic balance activities, gait m "

## 2024-04-09 ENCOUNTER — Ambulatory Visit

## 2024-04-12 ENCOUNTER — Ambulatory Visit

## 2024-04-17 ENCOUNTER — Ambulatory Visit

## 2024-04-19 ENCOUNTER — Ambulatory Visit

## 2024-08-20 ENCOUNTER — Encounter: Payer: Self-pay | Admitting: Neurology
